# Patient Record
Sex: Female | Born: 1935 | Race: White | Hispanic: No | Smoking: Former smoker
Health system: Southern US, Community
[De-identification: ages and names within clinical notes are randomized; demographics above are authoritative.]

## PROBLEM LIST (undated history)

## (undated) DIAGNOSIS — F419 Anxiety disorder, unspecified: Secondary | ICD-10-CM

## (undated) DIAGNOSIS — N289 Disorder of kidney and ureter, unspecified: Secondary | ICD-10-CM

## (undated) DIAGNOSIS — C50919 Malignant neoplasm of unspecified site of unspecified female breast: Secondary | ICD-10-CM

## (undated) DIAGNOSIS — K589 Irritable bowel syndrome without diarrhea: Secondary | ICD-10-CM

## (undated) DIAGNOSIS — K5790 Diverticulosis of intestine, part unspecified, without perforation or abscess without bleeding: Secondary | ICD-10-CM

## (undated) DIAGNOSIS — I4891 Unspecified atrial fibrillation: Secondary | ICD-10-CM

## (undated) DIAGNOSIS — K4090 Unilateral inguinal hernia, without obstruction or gangrene, not specified as recurrent: Secondary | ICD-10-CM

## (undated) DIAGNOSIS — K219 Gastro-esophageal reflux disease without esophagitis: Secondary | ICD-10-CM

## (undated) DIAGNOSIS — E039 Hypothyroidism, unspecified: Secondary | ICD-10-CM

## (undated) DIAGNOSIS — E875 Hyperkalemia: Secondary | ICD-10-CM

## (undated) DIAGNOSIS — M1990 Unspecified osteoarthritis, unspecified site: Secondary | ICD-10-CM

## (undated) DIAGNOSIS — I10 Essential (primary) hypertension: Secondary | ICD-10-CM

## (undated) DIAGNOSIS — Z8719 Personal history of other diseases of the digestive system: Secondary | ICD-10-CM

## (undated) DIAGNOSIS — D219 Benign neoplasm of connective and other soft tissue, unspecified: Secondary | ICD-10-CM

## (undated) DIAGNOSIS — I341 Nonrheumatic mitral (valve) prolapse: Secondary | ICD-10-CM

## (undated) DIAGNOSIS — K648 Other hemorrhoids: Secondary | ICD-10-CM

## (undated) DIAGNOSIS — Z853 Personal history of malignant neoplasm of breast: Secondary | ICD-10-CM

## (undated) DIAGNOSIS — C73 Malignant neoplasm of thyroid gland: Secondary | ICD-10-CM

## (undated) HISTORY — DX: Unspecified atrial fibrillation: I48.91

## (undated) HISTORY — DX: Nonrheumatic mitral (valve) prolapse: I34.1

## (undated) HISTORY — DX: Hyperkalemia: E87.5

## (undated) HISTORY — DX: Hypothyroidism, unspecified: E03.9

## (undated) HISTORY — DX: Other hemorrhoids: K64.8

## (undated) HISTORY — PX: THYROIDECTOMY: SHX17

## (undated) HISTORY — DX: Personal history of malignant neoplasm of breast: Z85.3

## (undated) HISTORY — DX: Benign neoplasm of connective and other soft tissue, unspecified: D21.9

## (undated) HISTORY — DX: Personal history of other diseases of the digestive system: Z87.19

## (undated) HISTORY — DX: Unspecified osteoarthritis, unspecified site: M19.90

## (undated) HISTORY — PX: PACEMAKER INSERTION: SHX728

## (undated) HISTORY — DX: Unilateral inguinal hernia, without obstruction or gangrene, not specified as recurrent: K40.90

## (undated) HISTORY — DX: Anxiety disorder, unspecified: F41.9

## (undated) HISTORY — DX: Malignant neoplasm of thyroid gland: C73

## (undated) HISTORY — DX: Irritable bowel syndrome, unspecified: K58.9

## (undated) HISTORY — DX: Gastro-esophageal reflux disease without esophagitis: K21.9

## (undated) HISTORY — DX: Disorder of kidney and ureter, unspecified: N28.9

## (undated) HISTORY — DX: Diverticulosis of intestine, part unspecified, without perforation or abscess without bleeding: K57.90

## (undated) HISTORY — PX: TONSILLECTOMY: SUR1361

---

## 1980-02-09 HISTORY — PX: MASTECTOMY: SHX3

## 1997-09-12 ENCOUNTER — Ambulatory Visit (HOSPITAL_COMMUNITY): Admission: RE | Admit: 1997-09-12 | Discharge: 1997-09-12 | Payer: Self-pay | Admitting: Family Medicine

## 1997-09-23 ENCOUNTER — Encounter (HOSPITAL_COMMUNITY): Admission: RE | Admit: 1997-09-23 | Discharge: 1997-12-18 | Payer: Self-pay | Admitting: Family Medicine

## 1997-12-18 ENCOUNTER — Encounter (HOSPITAL_COMMUNITY): Admission: RE | Admit: 1997-12-18 | Discharge: 1998-03-18 | Payer: Self-pay | Admitting: Family Medicine

## 1998-02-11 ENCOUNTER — Ambulatory Visit (HOSPITAL_COMMUNITY): Admission: RE | Admit: 1998-02-11 | Discharge: 1998-02-11 | Payer: Self-pay | Admitting: Family Medicine

## 1998-05-07 ENCOUNTER — Other Ambulatory Visit: Admission: RE | Admit: 1998-05-07 | Discharge: 1998-05-07 | Payer: Self-pay | Admitting: *Deleted

## 1998-10-30 ENCOUNTER — Encounter: Payer: Self-pay | Admitting: Internal Medicine

## 1998-10-30 ENCOUNTER — Ambulatory Visit (HOSPITAL_COMMUNITY): Admission: RE | Admit: 1998-10-30 | Discharge: 1998-10-31 | Payer: Self-pay | Admitting: Internal Medicine

## 1998-10-31 ENCOUNTER — Encounter: Payer: Self-pay | Admitting: Internal Medicine

## 1999-05-21 ENCOUNTER — Emergency Department (HOSPITAL_COMMUNITY): Admission: EM | Admit: 1999-05-21 | Discharge: 1999-05-21 | Payer: Self-pay | Admitting: Emergency Medicine

## 2000-07-06 ENCOUNTER — Encounter: Payer: Self-pay | Admitting: Emergency Medicine

## 2000-07-06 ENCOUNTER — Observation Stay (HOSPITAL_COMMUNITY): Admission: EM | Admit: 2000-07-06 | Discharge: 2000-07-07 | Payer: Self-pay | Admitting: Emergency Medicine

## 2000-07-17 ENCOUNTER — Inpatient Hospital Stay (HOSPITAL_COMMUNITY): Admission: EM | Admit: 2000-07-17 | Discharge: 2000-07-20 | Payer: Self-pay | Admitting: Emergency Medicine

## 2000-09-06 ENCOUNTER — Encounter: Payer: Self-pay | Admitting: Internal Medicine

## 2000-09-06 ENCOUNTER — Encounter (INDEPENDENT_AMBULATORY_CARE_PROVIDER_SITE_OTHER): Payer: Self-pay | Admitting: Specialist

## 2000-09-06 ENCOUNTER — Ambulatory Visit (HOSPITAL_COMMUNITY): Admission: RE | Admit: 2000-09-06 | Discharge: 2000-09-06 | Payer: Self-pay | Admitting: *Deleted

## 2000-09-06 HISTORY — PX: UPPER GASTROINTESTINAL ENDOSCOPY: SHX188

## 2001-05-03 ENCOUNTER — Ambulatory Visit (HOSPITAL_COMMUNITY): Admission: RE | Admit: 2001-05-03 | Discharge: 2001-05-03 | Payer: Self-pay | Admitting: Internal Medicine

## 2001-05-25 ENCOUNTER — Other Ambulatory Visit: Admission: RE | Admit: 2001-05-25 | Discharge: 2001-05-25 | Payer: Self-pay | Admitting: Family Medicine

## 2002-05-29 ENCOUNTER — Other Ambulatory Visit: Admission: RE | Admit: 2002-05-29 | Discharge: 2002-05-29 | Payer: Self-pay | Admitting: Family Medicine

## 2003-05-31 ENCOUNTER — Other Ambulatory Visit: Admission: RE | Admit: 2003-05-31 | Discharge: 2003-05-31 | Payer: Self-pay | Admitting: Family Medicine

## 2004-01-08 ENCOUNTER — Ambulatory Visit: Payer: Self-pay

## 2004-02-04 ENCOUNTER — Ambulatory Visit: Payer: Self-pay

## 2004-02-29 ENCOUNTER — Emergency Department (HOSPITAL_COMMUNITY): Admission: EM | Admit: 2004-02-29 | Discharge: 2004-02-29 | Payer: Self-pay | Admitting: Family Medicine

## 2004-03-07 ENCOUNTER — Ambulatory Visit: Payer: Self-pay | Admitting: Internal Medicine

## 2004-03-18 ENCOUNTER — Ambulatory Visit: Payer: Self-pay | Admitting: Internal Medicine

## 2004-04-15 ENCOUNTER — Ambulatory Visit: Payer: Self-pay | Admitting: Internal Medicine

## 2004-05-14 ENCOUNTER — Ambulatory Visit: Payer: Self-pay | Admitting: Internal Medicine

## 2004-06-16 ENCOUNTER — Other Ambulatory Visit: Admission: RE | Admit: 2004-06-16 | Discharge: 2004-06-16 | Payer: Self-pay | Admitting: Family Medicine

## 2004-06-18 ENCOUNTER — Ambulatory Visit: Payer: Self-pay | Admitting: Internal Medicine

## 2004-06-30 ENCOUNTER — Ambulatory Visit: Payer: Self-pay

## 2004-09-09 ENCOUNTER — Ambulatory Visit: Payer: Self-pay | Admitting: Internal Medicine

## 2004-10-14 ENCOUNTER — Ambulatory Visit: Payer: Self-pay | Admitting: Internal Medicine

## 2004-11-11 ENCOUNTER — Ambulatory Visit: Payer: Self-pay | Admitting: Internal Medicine

## 2004-12-10 ENCOUNTER — Ambulatory Visit: Payer: Self-pay | Admitting: Internal Medicine

## 2005-01-11 ENCOUNTER — Ambulatory Visit: Payer: Self-pay | Admitting: Internal Medicine

## 2005-02-15 ENCOUNTER — Ambulatory Visit: Payer: Self-pay | Admitting: Internal Medicine

## 2005-03-17 ENCOUNTER — Ambulatory Visit: Payer: Self-pay | Admitting: Internal Medicine

## 2005-04-15 ENCOUNTER — Ambulatory Visit: Payer: Self-pay | Admitting: Internal Medicine

## 2005-05-31 ENCOUNTER — Ambulatory Visit: Payer: Self-pay

## 2005-07-14 ENCOUNTER — Ambulatory Visit: Payer: Self-pay | Admitting: Cardiology

## 2005-07-23 ENCOUNTER — Ambulatory Visit: Payer: Self-pay

## 2005-08-04 ENCOUNTER — Ambulatory Visit: Payer: Self-pay | Admitting: Internal Medicine

## 2005-08-04 ENCOUNTER — Ambulatory Visit: Payer: Self-pay

## 2005-08-04 ENCOUNTER — Encounter: Payer: Self-pay | Admitting: Internal Medicine

## 2005-08-09 ENCOUNTER — Ambulatory Visit: Payer: Self-pay | Admitting: Internal Medicine

## 2005-10-28 ENCOUNTER — Encounter: Payer: Self-pay | Admitting: Internal Medicine

## 2005-10-28 ENCOUNTER — Ambulatory Visit (HOSPITAL_COMMUNITY): Admission: RE | Admit: 2005-10-28 | Discharge: 2005-10-28 | Payer: Self-pay | Admitting: *Deleted

## 2005-10-28 HISTORY — PX: COLONOSCOPY: SHX5424

## 2005-11-05 ENCOUNTER — Encounter: Admission: RE | Admit: 2005-11-05 | Discharge: 2005-11-05 | Payer: Self-pay | Admitting: Family Medicine

## 2005-11-25 ENCOUNTER — Ambulatory Visit: Payer: Self-pay | Admitting: Internal Medicine

## 2005-11-30 ENCOUNTER — Ambulatory Visit: Payer: Self-pay | Admitting: Internal Medicine

## 2005-12-10 ENCOUNTER — Ambulatory Visit: Payer: Self-pay | Admitting: Internal Medicine

## 2005-12-10 ENCOUNTER — Ambulatory Visit: Admission: RE | Admit: 2005-12-10 | Discharge: 2005-12-10 | Payer: Self-pay | Admitting: Internal Medicine

## 2005-12-20 ENCOUNTER — Ambulatory Visit: Payer: Self-pay

## 2006-01-11 ENCOUNTER — Ambulatory Visit: Payer: Self-pay | Admitting: Cardiology

## 2006-01-11 ENCOUNTER — Ambulatory Visit (HOSPITAL_COMMUNITY): Admission: AD | Admit: 2006-01-11 | Discharge: 2006-01-11 | Payer: Self-pay | Admitting: Internal Medicine

## 2006-01-14 ENCOUNTER — Ambulatory Visit: Payer: Self-pay | Admitting: Internal Medicine

## 2006-01-27 ENCOUNTER — Ambulatory Visit: Payer: Self-pay | Admitting: Internal Medicine

## 2006-02-11 ENCOUNTER — Ambulatory Visit: Payer: Self-pay | Admitting: Internal Medicine

## 2006-05-25 ENCOUNTER — Ambulatory Visit: Payer: Self-pay | Admitting: Internal Medicine

## 2006-07-26 ENCOUNTER — Other Ambulatory Visit: Admission: RE | Admit: 2006-07-26 | Discharge: 2006-07-26 | Payer: Self-pay | Admitting: Family Medicine

## 2006-12-15 ENCOUNTER — Ambulatory Visit: Payer: Self-pay

## 2007-02-15 ENCOUNTER — Ambulatory Visit: Payer: Self-pay | Admitting: Internal Medicine

## 2007-05-31 ENCOUNTER — Ambulatory Visit: Payer: Self-pay | Admitting: Internal Medicine

## 2007-06-07 ENCOUNTER — Encounter: Payer: Self-pay | Admitting: Internal Medicine

## 2007-06-07 ENCOUNTER — Ambulatory Visit: Payer: Self-pay

## 2007-07-21 ENCOUNTER — Encounter: Payer: Self-pay | Admitting: Internal Medicine

## 2007-11-07 ENCOUNTER — Encounter: Admission: RE | Admit: 2007-11-07 | Discharge: 2007-11-07 | Payer: Self-pay | Admitting: Family Medicine

## 2007-11-14 ENCOUNTER — Ambulatory Visit: Payer: Self-pay | Admitting: Internal Medicine

## 2008-02-26 ENCOUNTER — Ambulatory Visit: Payer: Self-pay | Admitting: Internal Medicine

## 2008-02-26 ENCOUNTER — Encounter: Payer: Self-pay | Admitting: Nurse Practitioner

## 2008-02-26 DIAGNOSIS — Z853 Personal history of malignant neoplasm of breast: Secondary | ICD-10-CM | POA: Insufficient documentation

## 2008-02-26 DIAGNOSIS — E039 Hypothyroidism, unspecified: Secondary | ICD-10-CM | POA: Insufficient documentation

## 2008-02-26 DIAGNOSIS — I4891 Unspecified atrial fibrillation: Secondary | ICD-10-CM | POA: Insufficient documentation

## 2008-02-26 DIAGNOSIS — N259 Disorder resulting from impaired renal tubular function, unspecified: Secondary | ICD-10-CM | POA: Insufficient documentation

## 2008-02-26 DIAGNOSIS — M810 Age-related osteoporosis without current pathological fracture: Secondary | ICD-10-CM | POA: Insufficient documentation

## 2008-02-26 LAB — CONVERTED CEMR LAB
ALT: 21 units/L (ref 0–35)
AST: 22 units/L (ref 0–37)
Albumin: 3.9 g/dL (ref 3.5–5.2)
Alkaline Phosphatase: 41 units/L (ref 39–117)
BUN: 26 mg/dL — ABNORMAL HIGH (ref 6–23)
Bacteria, UA: NEGATIVE
Basophils Absolute: 0 10*3/uL (ref 0.0–0.1)
Basophils Relative: 0.9 % (ref 0.0–3.0)
Bilirubin Urine: NEGATIVE
Bilirubin, Direct: 0.1 mg/dL (ref 0.0–0.3)
CO2: 30 meq/L (ref 19–32)
Calcium: 8.8 mg/dL (ref 8.4–10.5)
Chloride: 105 meq/L (ref 96–112)
Creatinine, Ser: 1.2 mg/dL (ref 0.4–1.2)
Crystals: NEGATIVE
Eosinophils Absolute: 0.4 10*3/uL (ref 0.0–0.7)
Eosinophils Relative: 6.9 % — ABNORMAL HIGH (ref 0.0–5.0)
GFR calc Af Amer: 55 mL/min
GFR calc non Af Amer: 46 mL/min
Glucose, Bld: 87 mg/dL (ref 70–99)
HCT: 38.3 % (ref 36.0–46.0)
Hemoglobin: 13.4 g/dL (ref 12.0–15.0)
Ketones, ur: NEGATIVE mg/dL
Lymphocytes Relative: 24.7 % (ref 12.0–46.0)
MCHC: 35 g/dL (ref 30.0–36.0)
MCV: 91.2 fL (ref 78.0–100.0)
Monocytes Absolute: 0.5 10*3/uL (ref 0.1–1.0)
Monocytes Relative: 9 % (ref 3.0–12.0)
Mucus, UA: NEGATIVE
Neutro Abs: 3 10*3/uL (ref 1.4–7.7)
Neutrophils Relative %: 58.5 % (ref 43.0–77.0)
Nitrite: NEGATIVE
Platelets: 190 10*3/uL (ref 150–400)
Potassium: 5 meq/L (ref 3.5–5.1)
RBC: 4.2 M/uL (ref 3.87–5.11)
RDW: 12.1 % (ref 11.5–14.6)
Sodium: 141 meq/L (ref 135–145)
Specific Gravity, Urine: 1.01 (ref 1.000–1.03)
TSH: 0.97 microintl units/mL (ref 0.35–5.50)
Total Bilirubin: 0.8 mg/dL (ref 0.3–1.2)
Total Protein, Urine: NEGATIVE mg/dL
Total Protein: 6.1 g/dL (ref 6.0–8.3)
Urine Glucose: NEGATIVE mg/dL
Urobilinogen, UA: 0.2 (ref 0.0–1.0)
Vit D, 1,25-Dihydroxy: 50 (ref 30–89)
WBC: 5.2 10*3/uL (ref 4.5–10.5)
pH: 5.5 (ref 5.0–8.0)

## 2008-02-27 ENCOUNTER — Encounter: Payer: Self-pay | Admitting: Internal Medicine

## 2008-02-28 ENCOUNTER — Telehealth: Payer: Self-pay | Admitting: Internal Medicine

## 2008-02-28 LAB — CONVERTED CEMR LAB: Vit D, 1,25-Dihydroxy: 50 (ref 30–89)

## 2008-03-04 ENCOUNTER — Telehealth: Payer: Self-pay | Admitting: Internal Medicine

## 2008-03-13 ENCOUNTER — Telehealth: Payer: Self-pay | Admitting: Internal Medicine

## 2008-03-19 ENCOUNTER — Ambulatory Visit: Payer: Self-pay | Admitting: Cardiology

## 2008-04-02 ENCOUNTER — Ambulatory Visit: Payer: Self-pay | Admitting: Cardiovascular Disease

## 2008-04-16 ENCOUNTER — Telehealth: Payer: Self-pay | Admitting: Internal Medicine

## 2008-05-03 ENCOUNTER — Ambulatory Visit: Payer: Self-pay | Admitting: Cardiology

## 2008-05-23 ENCOUNTER — Encounter: Payer: Self-pay | Admitting: Internal Medicine

## 2008-05-27 ENCOUNTER — Telehealth: Payer: Self-pay | Admitting: Internal Medicine

## 2008-05-28 ENCOUNTER — Ambulatory Visit: Payer: Self-pay | Admitting: Internal Medicine

## 2008-05-28 ENCOUNTER — Ambulatory Visit: Payer: Self-pay | Admitting: Cardiology

## 2008-05-28 ENCOUNTER — Encounter: Payer: Self-pay | Admitting: Internal Medicine

## 2008-05-28 DIAGNOSIS — I442 Atrioventricular block, complete: Secondary | ICD-10-CM | POA: Insufficient documentation

## 2008-06-20 ENCOUNTER — Telehealth: Payer: Self-pay | Admitting: Internal Medicine

## 2008-06-25 ENCOUNTER — Ambulatory Visit: Payer: Self-pay | Admitting: Internal Medicine

## 2008-07-09 ENCOUNTER — Encounter: Payer: Self-pay | Admitting: *Deleted

## 2008-07-23 ENCOUNTER — Ambulatory Visit: Payer: Self-pay | Admitting: Internal Medicine

## 2008-07-23 ENCOUNTER — Encounter (INDEPENDENT_AMBULATORY_CARE_PROVIDER_SITE_OTHER): Payer: Self-pay | Admitting: Cardiology

## 2008-07-23 LAB — CONVERTED CEMR LAB
POC INR: 1.9
Protime: 17

## 2008-08-08 ENCOUNTER — Telehealth: Payer: Self-pay | Admitting: Internal Medicine

## 2008-08-14 ENCOUNTER — Encounter: Payer: Self-pay | Admitting: *Deleted

## 2008-08-20 ENCOUNTER — Ambulatory Visit: Payer: Self-pay | Admitting: Cardiology

## 2008-08-20 ENCOUNTER — Encounter (INDEPENDENT_AMBULATORY_CARE_PROVIDER_SITE_OTHER): Payer: Self-pay | Admitting: Cardiology

## 2008-08-20 LAB — CONVERTED CEMR LAB
POC INR: 2.3
Prothrombin Time: 18.5 s

## 2008-09-17 ENCOUNTER — Ambulatory Visit: Payer: Self-pay | Admitting: Internal Medicine

## 2008-09-17 LAB — CONVERTED CEMR LAB: POC INR: 2.3

## 2008-10-08 ENCOUNTER — Ambulatory Visit: Payer: Self-pay | Admitting: Cardiology

## 2008-10-08 LAB — CONVERTED CEMR LAB: POC INR: 3

## 2008-11-05 ENCOUNTER — Ambulatory Visit: Payer: Self-pay | Admitting: Cardiology

## 2008-11-13 ENCOUNTER — Encounter: Payer: Self-pay | Admitting: Internal Medicine

## 2008-11-13 ENCOUNTER — Ambulatory Visit: Payer: Self-pay

## 2008-11-15 ENCOUNTER — Ambulatory Visit: Payer: Self-pay | Admitting: Internal Medicine

## 2008-12-13 ENCOUNTER — Ambulatory Visit: Payer: Self-pay | Admitting: Cardiovascular Disease

## 2008-12-13 ENCOUNTER — Ambulatory Visit: Payer: Self-pay | Admitting: Internal Medicine

## 2009-01-10 ENCOUNTER — Ambulatory Visit: Payer: Self-pay | Admitting: Cardiovascular Disease

## 2009-02-06 ENCOUNTER — Telehealth: Payer: Self-pay | Admitting: Internal Medicine

## 2009-02-10 ENCOUNTER — Ambulatory Visit: Payer: Self-pay | Admitting: Cardiology

## 2009-02-26 ENCOUNTER — Ambulatory Visit: Payer: Self-pay | Admitting: Internal Medicine

## 2009-02-26 LAB — CONVERTED CEMR LAB
Alkaline Phosphatase: 45 units/L (ref 39–117)
BUN: 25 mg/dL — ABNORMAL HIGH (ref 6–23)
Basophils Absolute: 0 10*3/uL (ref 0.0–0.1)
Basophils Relative: 1 % (ref 0.0–3.0)
Bilirubin, Direct: 0.1 mg/dL (ref 0.0–0.3)
CO2: 28 meq/L (ref 19–32)
Calcium: 8.7 mg/dL (ref 8.4–10.5)
Chloride: 107 meq/L (ref 96–112)
Cholesterol: 156 mg/dL (ref 0–200)
Creatinine, Ser: 1.1 mg/dL (ref 0.4–1.2)
Eosinophils Absolute: 0.2 10*3/uL (ref 0.0–0.7)
HDL: 45.2 mg/dL (ref >39.00)
Hemoglobin, Urine: NEGATIVE
LDL Cholesterol: 99 mg/dL (ref 0–99)
Lymphocytes Relative: 32.2 % (ref 12.0–46.0)
MCHC: 32.3 g/dL (ref 30.0–36.0)
MCV: 94.3 fL (ref 78.0–100.0)
Monocytes Absolute: 0.4 10*3/uL (ref 0.1–1.0)
Neutrophils Relative %: 52.3 % (ref 43.0–77.0)
Nitrite: NEGATIVE
Platelets: 171 10*3/uL (ref 150.0–400.0)
RBC: 4.14 M/uL (ref 3.87–5.11)
Specific Gravity, Urine: 1.01 (ref 1.000–1.030)
Total Bilirubin: 0.8 mg/dL (ref 0.3–1.2)
Total CHOL/HDL Ratio: 3
Total Protein, Urine: NEGATIVE mg/dL
Triglycerides: 60 mg/dL (ref 0.0–149.0)
Urine Glucose: NEGATIVE mg/dL
pH: 5 (ref 5.0–8.0)

## 2009-03-04 ENCOUNTER — Telehealth: Payer: Self-pay | Admitting: Internal Medicine

## 2009-03-05 ENCOUNTER — Encounter: Payer: Self-pay | Admitting: Internal Medicine

## 2009-03-05 ENCOUNTER — Ambulatory Visit: Payer: Self-pay | Admitting: Internal Medicine

## 2009-03-14 ENCOUNTER — Encounter: Payer: Self-pay | Admitting: Internal Medicine

## 2009-03-20 ENCOUNTER — Ambulatory Visit: Payer: Self-pay | Admitting: Cardiology

## 2009-03-20 LAB — CONVERTED CEMR LAB: POC INR: 3

## 2009-03-21 ENCOUNTER — Telehealth: Payer: Self-pay | Admitting: Internal Medicine

## 2009-04-08 ENCOUNTER — Encounter (INDEPENDENT_AMBULATORY_CARE_PROVIDER_SITE_OTHER): Payer: Self-pay | Admitting: *Deleted

## 2009-04-17 ENCOUNTER — Ambulatory Visit: Payer: Self-pay | Admitting: Cardiology

## 2009-04-17 LAB — CONVERTED CEMR LAB: POC INR: 2.7

## 2009-05-08 ENCOUNTER — Telehealth: Payer: Self-pay | Admitting: Internal Medicine

## 2009-05-15 ENCOUNTER — Ambulatory Visit: Payer: Self-pay | Admitting: Cardiovascular Disease

## 2009-05-15 LAB — CONVERTED CEMR LAB: POC INR: 2.4

## 2009-05-21 ENCOUNTER — Telehealth: Payer: Self-pay | Admitting: Internal Medicine

## 2009-06-03 ENCOUNTER — Ambulatory Visit: Payer: Self-pay | Admitting: Internal Medicine

## 2009-06-03 DIAGNOSIS — R002 Palpitations: Secondary | ICD-10-CM | POA: Insufficient documentation

## 2009-06-10 ENCOUNTER — Ambulatory Visit: Payer: Self-pay | Admitting: Internal Medicine

## 2009-06-10 DIAGNOSIS — K219 Gastro-esophageal reflux disease without esophagitis: Secondary | ICD-10-CM | POA: Insufficient documentation

## 2009-06-12 ENCOUNTER — Ambulatory Visit: Payer: Self-pay | Admitting: Cardiology

## 2009-06-12 LAB — CONVERTED CEMR LAB: POC INR: 4.5

## 2009-06-23 ENCOUNTER — Ambulatory Visit: Payer: Self-pay | Admitting: Cardiovascular Disease

## 2009-06-23 LAB — CONVERTED CEMR LAB: POC INR: 3.9

## 2009-07-08 ENCOUNTER — Ambulatory Visit: Payer: Self-pay | Admitting: Internal Medicine

## 2009-08-05 ENCOUNTER — Ambulatory Visit: Payer: Self-pay | Admitting: Internal Medicine

## 2009-09-02 ENCOUNTER — Ambulatory Visit: Payer: Self-pay | Admitting: Cardiology

## 2009-09-02 LAB — CONVERTED CEMR LAB: POC INR: 2.2

## 2009-10-06 ENCOUNTER — Ambulatory Visit: Payer: Self-pay | Admitting: Cardiology

## 2009-10-14 ENCOUNTER — Ambulatory Visit: Payer: Self-pay | Admitting: Internal Medicine

## 2009-10-14 DIAGNOSIS — R1031 Right lower quadrant pain: Secondary | ICD-10-CM | POA: Insufficient documentation

## 2009-10-14 LAB — CONVERTED CEMR LAB
ALT: 18 units/L (ref 0–35)
Amylase: 74 units/L (ref 27–131)
BUN: 24 mg/dL — ABNORMAL HIGH (ref 6–23)
Basophils Relative: 1.1 % (ref 0.0–3.0)
Bilirubin Urine: NEGATIVE
CO2: 30 meq/L (ref 19–32)
Chloride: 101 meq/L (ref 96–112)
Eosinophils Absolute: 0.6 10*3/uL (ref 0.0–0.7)
Eosinophils Relative: 8.3 % — ABNORMAL HIGH (ref 0.0–5.0)
HCT: 39.2 % (ref 36.0–46.0)
Ketones, ur: NEGATIVE mg/dL
Lymphs Abs: 1.3 10*3/uL (ref 0.7–4.0)
MCHC: 34.1 g/dL (ref 30.0–36.0)
MCV: 92.8 fL (ref 78.0–100.0)
Monocytes Absolute: 0.6 10*3/uL (ref 0.1–1.0)
Platelets: 198 10*3/uL (ref 150.0–400.0)
Potassium: 5.7 meq/L — ABNORMAL HIGH (ref 3.5–5.1)
TSH: 1.56 microintl units/mL (ref 0.35–5.50)
Total Bilirubin: 0.7 mg/dL (ref 0.3–1.2)
Total Protein: 6.5 g/dL (ref 6.0–8.3)
Urine Glucose: NEGATIVE mg/dL
Urobilinogen, UA: 0.2 (ref 0.0–1.0)
WBC: 6.7 10*3/uL (ref 4.5–10.5)

## 2009-10-15 ENCOUNTER — Encounter: Payer: Self-pay | Admitting: Internal Medicine

## 2009-10-15 ENCOUNTER — Telehealth: Payer: Self-pay | Admitting: Internal Medicine

## 2009-10-15 ENCOUNTER — Ambulatory Visit: Payer: Self-pay | Admitting: Internal Medicine

## 2009-10-15 ENCOUNTER — Encounter (INDEPENDENT_AMBULATORY_CARE_PROVIDER_SITE_OTHER): Payer: Self-pay | Admitting: *Deleted

## 2009-10-15 DIAGNOSIS — E875 Hyperkalemia: Secondary | ICD-10-CM | POA: Insufficient documentation

## 2009-10-15 LAB — CONVERTED CEMR LAB
CO2: 31 meq/L (ref 19–32)
Calcium: 8.6 mg/dL (ref 8.4–10.5)
Chloride: 100 meq/L (ref 96–112)
Potassium: 4.9 meq/L (ref 3.5–5.1)
Sodium: 139 meq/L (ref 135–145)

## 2009-11-03 ENCOUNTER — Ambulatory Visit: Payer: Self-pay | Admitting: Cardiovascular Disease

## 2009-11-03 LAB — CONVERTED CEMR LAB: POC INR: 2.8

## 2009-12-01 ENCOUNTER — Ambulatory Visit: Payer: Self-pay | Admitting: Internal Medicine

## 2009-12-01 DIAGNOSIS — K589 Irritable bowel syndrome without diarrhea: Secondary | ICD-10-CM | POA: Insufficient documentation

## 2009-12-01 LAB — CONVERTED CEMR LAB: POC INR: 2.6

## 2009-12-29 ENCOUNTER — Ambulatory Visit: Payer: Self-pay | Admitting: Cardiovascular Disease

## 2010-01-26 ENCOUNTER — Ambulatory Visit: Payer: Self-pay | Admitting: Internal Medicine

## 2010-01-26 LAB — CONVERTED CEMR LAB: POC INR: 2.6

## 2010-02-23 ENCOUNTER — Ambulatory Visit: Admission: RE | Admit: 2010-02-23 | Discharge: 2010-02-23 | Payer: Self-pay | Source: Home / Self Care

## 2010-03-10 NOTE — Assessment & Plan Note (Signed)
Summary: pacer check/medtronic      Allergies Added: NKDA  History of Present Illness: Alyssa [name] is seen in followup for complete heart block. She is status post pacemaker implantation. She has atrial fibrillation which is permanent. She is taking and tolerating her Coumadin. There have been no intercurrent problems with exercise intolerance shortness of breath dyspnea or chest pain.  she has recently noticed however palpitations that occurred night watches what in the bathroom otherwise at times of modest exertion. She saw her primary care physician a couple months ago at which time blood were drawn;  in January her TSH and hemoglobin were normal.  Current Medications (verified): 1)  Levoxyl 88 Mcg Tabs (Levothyroxine Sodium) .... Take 1 Tablet By Mouth Once A Day 2)  Metoprolol Tartrate 50 Mg Tabs (Metoprolol Tartrate) .... 1/2am- 1/2 Pm 3)  Coumadin 3 Mg Tabs (Warfarin Sodium) .... Use As Directed By Anticoagulation Clinic. 4)  Tylenol Arthritis Pain 650 Mg Cr-Tabs (Acetaminophen) .... Take 1 Tablet By Mouth Two Times A Day 5)  Centrium Silver .... Take 1 Tablet By Mouth Once A Day 6)  Calcium + Vitamin D .... Take 1 Tablet By Mouth Once A Day 7)  Fiber 500mg  .... Qdtab 8)  Zantac 150 Mg Tabs (Ranitidine Hcl) .... Take 1 Tablet By Mouth Once A Day 9)  Loratadine 10 Mg Tabs (Loratadine) .... Take 1 Tablet By Mouth Once A Day 10)  Coumadin 4 Mg Tabs (Warfarin Sodium) .... Use As Directed By Anticoagulation Clinic.  Allergies (verified): No Known Drug Allergies  Past History:  Past Medical History: Last updated: 02/26/2008 Hypothyroidism Osteoporosis Renal insufficiency (BUN=27 and Cr.=1.28 on 07/21/07) Thyroid Cancer Breast cancer, hx of A.fib, pacemaker MVP Left direct inguinal hernia Atrial fibrillation  Past Surgical History: Last updated: 02/26/2008 Thyroidectomy Mastectomy Bilat. in 1982 Tonsillectomy  Family History: Last updated: 02/26/2009 Family History of  Arthritis Family History Hypertension Family History Lung cancer Family History of Stroke F 1st degree relative <60 Family History of Colon CA 1st degree relative <60  Social History: Last updated: 02/26/2008 Retired Widow/Widower Never Smoked Alcohol use-no Drug use-no Regular exercise-yes  Risk Factors: Alcohol Use: 0 (02/26/2009) Exercise: yes (02/26/2008)  Risk Factors: Smoking Status: never (02/26/2009) Passive Smoke Exposure: no (02/26/2009)  Vital Signs:  Patient profile:   77 year old female Height:      66 inches Weight:      146 pounds BMI:     23.65 Pulse rate:   79 / minute BP sitting:   130 / 76  (left arm) Cuff size:   regular  Vitals Entered By: Judithe Modest CMA (June 03, 2009 12:29 PM)  Physical Exam  General:  The patient was alert and oriented in no acute distress. HEENT Normal.  Neck veins were flat, carotids were brisk.  Lungs were clear.  Heart sounds were regular without murmurs or gallops.  Abdomen was soft with active bowel sounds. There is no clubbing cyanosis or edema. Skin Warm and dry    PPM Specifications Following MD:  Sherryl Manges, MD     PPM Vendor:  Medtronic     PPM Model Number:  [id number]     PPM Serial Number:  [id number] [facility] PPM DOI:  12/10/2005     PPM Implanting MD:  Sherryl Manges, MD  Lead 1    Location: RA     DOI: 10/30/1998     Model #: [id number]     Serial #: [id number]     Status: active Lead 2  Location: RV     DOI: 10/30/1998     Model #: [id number]     Serial #: [id number]     Status: active  Magnet Response Rate:  BOL 85 ERI  65  Indications:  Pacemaker dependent   PPM Follow Up Remote Check?  No Battery Voltage:  2.77 V     Battery Est. Longevity:  20yrs     Pacer Dependent:  Yes     Right Ventricle  Amplitude: PACED AT 30 mV, Impedance: 543 ohms, Threshold: 0.750 V at 0.40 msec  Episodes MS Episodes:  0     Coumadin:  Yes Ventricular High Rate:  0     Ventricular Pacing:  99.8%  Parameters Mode:  VVIR     Lower Rate  Limit:  60     Upper Rate Limit:  130 Next Cardiology Appt Due:  12/05/2009 Tech Comments:  ROV 6 MTHS IN CLINIC.  NORMAL DEVICE FUNCTION.  DR Graciela Husbands TO EVALUATE HISTOGRAMS TO DETERMINE PROGRAMMING CHANGES. Gypsy Balsam RN BSN  June 03, 2009 12:40 PM  Impression & Recommendations:  Problem # 1:  PALPITATIONS (ICD-785.1) the patient had palpitations with modest exertion. She has the bifid hump of excessive ADL rate programming with a Medtronic device. This was reprogrammed. She is to let us know how it is that she feels. Her updated medication list for this problem includes:    Metoprolol Tartrate 50 Mg Tabs (Metoprolol tartrate) .Marland Kitchen... 1/2am- 1/2 pm    Coumadin 3 Mg Tabs (Warfarin sodium) ..... Use as directed by anticoagulation clinic.    Coumadin 4 Mg Tabs (Warfarin sodium) ..... Use as directed by anticoagulation clinic.  Problem # 2:  PACEMAKER, PERMANENT: MEDTRONIC VERSA (ICD-V45.01) Device parameters and data were reviewed and the device was reprogrammed as above with her ADL rate going from 3-2  Problem # 3:  ATRIAL FIBRILLATION (ICD-427.31) permanent on Coumadin Her updated medication list for this problem includes:    Metoprolol Tartrate 50 Mg Tabs (Metoprolol tartrate) .Marland Kitchen... 1/2am- 1/2 pm    Coumadin 3 Mg Tabs (Warfarin sodium) ..... Use as directed by anticoagulation clinic.    Coumadin 4 Mg Tabs (Warfarin sodium) ..... Use as directed by anticoagulation clinic.  Problem # 4:  AV BLOCK, COMPLETE (ICD-426.0) stable post pacemaker implant he Her updated medication list for this problem includes:    Metoprolol Tartrate 50 Mg Tabs (Metoprolol tartrate) .Marland Kitchen... 1/2am- 1/2 pm    Coumadin 3 Mg Tabs (Warfarin sodium) ..... Use as directed by anticoagulation clinic.    Coumadin 4 Mg Tabs (Warfarin sodium) ..... Use as directed by anticoagulation clinic.

## 2010-03-10 NOTE — Op Note (Signed)
Summary: Colonoscopy: Dr. Beatrix Fetters H. [facility]  Patient:    Alyssa [name], Alyssa [name]                         MRN: [id number] Proc. Date: 09/06/00 Adm. Date:  [id number] Attending:  Sabino Gasser CC:         Quita Skye. Artis Flock, M.D.   Procedure Report  PROCEDURE:  Colonoscopy.  GASTROENTEROLOGIST:  Sabino Gasser, M.D.  INDICATIONS:  Diarrhea which apparently now has subsided and colon cancer screening.  ANESTHESIA:  Demerol 10 mg.  PROCEDURE IN DETAIL:  With the patient mildly sedated and in the left lateral decubitus position, the Olympus video pediatric colonoscope, [id number],  was inserted in the rectum after rectal exam was performed, and then passed under direct vision to the cecum identified by ileocecal valve and appendiceal orifice, both of which were photographed.  From this point, the colonoscope was slowly withdrawn, taking circumferential views of the entire colonic mucosa, stopping only then to photograph diverticulosis moderately severe seen in the sigmoid colon, until we reached the rectum which appeared normal on direct view and showed internal hemorrhoids on retroflexed view.  The endoscope was straightened and withdrawn.  The patients vital signs and pulse oximetry remained stable.  The patient tolerated the procedure well with no apparent complications.  FINDINGS: 1. Essentially normal-appearing mucosa.  Random biopsy taken because    of the history of diarrhea, although it has now subsided. 2. Diverticulosis of the sigmoid colon. 3. Internal hemorrhoids. 4. Otherwise unremarkable examination.  PLAN:  Will have patient follow up on results of biopsy and follow up with me as needed. DD:  09/06/00 TD:  09/06/00 Job: [id number]

## 2010-03-10 NOTE — Letter (Signed)
Summary: [facility] Referral (unable to schedule) Notification  [facility]  [address] [place], [place] [address]   Phone: [phone]  Fax: [phone]      April 08, 2009 Alyssa [name] Jun 28, 1924 MRN: [id number]   Alyssa [name] [address] [place], [place]  [address]   Dear Dr. Yetta Barre:   Thank you for your kind referral of the above patient. We have attempted to schedule the recommended COLONOSCOPY but have been unable to schedule because:  __ The patient was not available by phone and/or has not returned our calls.  _X_ The patient declined to schedule the procedure at this time.  We appreciate the referral and hope that we will have the opportunity to treat this patient in the future.    Sincerely,   [facility]  Vania Rea. Jarold Motto M.D. Hedwig Morton. Juanda Chance M.D. Venita Lick. Russella Dar M.D. Wilhemina Bonito. Marina Goodell M.D. Barbette Hair. Arlyce Dice M.D. Iva Boop M.D. Cheron Every.D.

## 2010-03-10 NOTE — Progress Notes (Signed)
Summary: referral  Phone Note Call from Patient Call back at Home Phone [phone]   Summary of Call: Patient called this AM stating that she will be seeing Dr. Joseph Art (dermatology) at 10 this AM. She needs referral sent to them due to insurance. Their ph # is [phone]. Initial call taken by: Lucious Groves,  May 08, 2009 9:05 AM  Follow-up for Phone Call        what is the diagnosis for the referral? Follow-up by: [name],  May 08, 2009 9:08 AM  Additional Follow-up for Phone Call Additional follow up Details #1::        left message on machine to call back to office. Additional Follow-up by: Lucious Groves,  May 08, 2009 3:36 PM    Additional Follow-up for Phone Call Additional follow up Details #2::    spoke with pt and she has been a long time pt with Dr Joseph Art she does not need a ref. Follow-up by: Ami Bullins CMA,  May 09, 2009 8:48 AM

## 2010-03-10 NOTE — Medication Information (Signed)
Summary: rov/mb  Anticoagulant Therapy  Managed by: Bethena Midget, RN, BSN PCP: [name] Supervising MD: Ladona Ridgel MD, Sharlot Gowda Indication 1: Atrial Fibrillation (ICD-427.31) Lab Used: [facility] Site: [facility] INR POC 2.5 INR RANGE 2 - 3  Dietary changes: no    Health status changes: no    Bleeding/hemorrhagic complications: no    Recent/future hospitalizations: no    Any changes in medication regimen? no    Recent/future dental: no  Any missed doses?: no       Is patient compliant with meds? yes       Allergies: No Known Drug Allergies  Anticoagulation Management History:      The patient is taking warfarin and comes in today for a routine follow up visit.  Positive risk factors for bleeding include an age of 75 years or older.  The bleeding index is 'intermediate risk'.  Positive CHADS2 values include Age > 75 years old.  The start date was 03/12/1999.  Her last INR was 3.1.  Anticoagulation responsible provider: Ladona Ridgel MD, Sharlot Gowda.  INR POC: 2.5.  Cuvette Lot#: [id number].  Exp: 09/2010.    Anticoagulation Management Assessment/Plan:      The patient's current anticoagulation dose is Coumadin 3 mg tabs: Use as directed by anticoagulation clinic., Coumadin 4 mg tabs: use as directed by anticoagulation clinic..  The target INR is 2 - 3.  The next INR is due 08/05/2009.  Anticoagulation instructions were given to patient.  Results were reviewed/authorized by Bethena Midget, RN, BSN.  She was notified by Bethena Midget, RN, BSN.         Prior Anticoagulation Instructions: INR = 3.9  Hold Monday's dose, take 1/2 of a 4 mg tablet on tuesday.  Then begin normal schedule: 3 mg talbet SMWF, take 4 mg tablet on TTS  Current Anticoagulation Instructions: INR 2.5 Continue 3mg s everyday except 4mg s on Tuesdays, Thursdays and Saturdays. Recheck in 4 weeks.

## 2010-03-10 NOTE — Medication Information (Signed)
Summary: rov/ej      Allergies Added: NKDA Anticoagulant Therapy  Managed by: Earvin Hansen, Pharm D PCP: [name] Supervising MD: Excell Seltzer MD, Casimiro Needle Indication 1: Atrial Fibrillation (ICD-427.31) Lab Used: [facility] Site: [facility] INR POC 2.8 INR RANGE 2 - 3  Dietary changes: no            Current Medications (verified): 1)  Levoxyl 88 Mcg Tabs (Levothyroxine Sodium) .... Take 1 Tablet By Mouth Once A Day 2)  Metoprolol Tartrate 50 Mg Tabs (Metoprolol Tartrate) .... 1/2am- 1/2 Pm 3)  Coumadin 3 Mg Tabs (Warfarin Sodium) .... Use As Directed By Anticoagulation Clinic. 4)  Tylenol Arthritis Pain 650 Mg Cr-Tabs (Acetaminophen) .... Take 2 Tablet By Mouth 5)  Centrium Silver .... Take 1 Tablet By Mouth Once A Day 6)  Calcium + Vitamin D .... Take 1 Tablet By Mouth Once A Day 7)  Fiber 500mg  .... 2 Tabs Daily 8)  Loratadine 10 Mg Tabs (Loratadine) .... Take 1 Tablet By Mouth Once A Day 9)  Coumadin 4 Mg Tabs (Warfarin Sodium) .... Use As Directed By Anticoagulation Clinic. 10)  Ranitidine Hcl 300 Mg Caps (Ranitidine Hcl) .... One By Mouth At Bedtime For Heartburn 11)  Kionex 15 Gm/33ml Susp (Sodium Polystyrene Sulfonate) .... 60 Ml With Sorbitol By Mouth Once  Allergies (verified): No Known Drug Allergies  Anticoagulation Management History:      The patient is taking warfarin and comes in today for a routine follow up visit.  Positive risk factors for bleeding include an age of 64 years or older.  The bleeding index is 'intermediate risk'.  Positive CHADS2 values include Age > 68 years old.  The start date was 03/12/1999.  Her last INR was 3.1.  Anticoagulation responsible provider: Excell Seltzer MD, Casimiro Needle.  INR POC: 2.8.  Exp: 11/2010.    Anticoagulation Management Assessment/Plan:      The patient's current anticoagulation dose is Coumadin 3 mg tabs: Use as directed by anticoagulation clinic., Coumadin 4 mg tabs: use as directed by anticoagulation clinic..   The target INR is 2 - 3.  The next INR is due 12/01/2009.  Anticoagulation instructions were given to patient.  Results were reviewed/authorized by Earvin Hansen, Pharm D.  She was notified by Earvin Hansen PharmD.         Prior Anticoagulation Instructions: INR 3.1 Today take 1/2 tablet of 3mg s then resume 3mg s daily except 4mg s on Tuesdays, Thursdays and Saturdays. Recheck in 4 weeks.  Current Anticoagulation Instructions: Continue taking 3 mg on Sunday, Monday, Wednesday, & Friday; then take 4 mg on Tuesday, Thursday, and Saturdays.

## 2010-03-10 NOTE — Letter (Signed)
Summary: Lipid Letter  [facility]  [address] [place], [place] [address]   Phone: [phone]  Fax: [phone]    02/26/2009  Alyssa [name] [address] [name], [place]  [address]  Dear Ms. Grigg:  We have carefully reviewed your last lipid profile from 02/26/2009 and the results are noted below with a summary of recommendations for lipid management.    Cholesterol:       156     Goal: <   HDL "good" Cholesterol:   57.84     Goal: >   LDL "bad" Cholesterol:   99     Goal: <   Triglycerides:       60.0     Goal: <    great results!    TLC Diet (Therapeutic Lifestyle Change): Saturated Fats & Transfatty acids should be kept < 7% of total calories ***Reduce Saturated Fats Polyunstaurated Fat can be up to 10% of total calories Monounsaturated Fat Fat can be up to 20% of total calories Total Fat should be no greater than 25-35% of total calories Carbohydrates should be 50-60% of total calories Protein should be approximately 15% of total calories Fiber should be at least 20-30 grams a day ***Increased fiber may help lower LDL Total Cholesterol should be < 200mg /day Consider adding plant stanol/sterols to diet (example: Benacol spread) ***A higher intake of unsaturated fat may reduce Triglycerides and Increase HDL    Adjunctive Measures (may lower LIPIDS and reduce risk of Heart Attack) include: Aerobic Exercise (20-30 minutes 3-4 times a week) Limit Alcohol Consumption Weight Reduction Aspirin 75-81 mg a day by mouth (if not allergic or contraindicated) Dietary Fiber 20-30 grams a day by mouth     Current Medications: 1)    Levoxyl 88 Mcg Tabs (Levothyroxine sodium) .... Take 1 tablet by mouth once a day 2)    Metoprolol Tartrate 50 Mg Tabs (Metoprolol tartrate) .... 1/2am- 1/2 pm 3)    Coumadin 3 Mg Tabs (Warfarin sodium) .... Use as directed by anticoagulation clinic. 4)    Tylenol Arthritis Pain 650 Mg Cr-tabs (Acetaminophen) .... Take 1 tablet  by mouth two times a day 5)    Centrium Silver  .... Take 1 tablet by mouth once a day 6)    Calcium + Vitamin D  .... Take 1 tablet by mouth once a day 7)    Fiber 500mg   .... Qdtab 8)    Zantac 150 Mg Tabs (Ranitidine hcl) .... Take 1 tablet by mouth once a day 9)    Loratadine 10 Mg Tabs (Loratadine) .... Take 1 tablet by mouth once a day 10)    Coumadin 4 Mg Tabs (Warfarin sodium) .... Use as directed by anticoagulation clinic.  If you have any questions, please call. We appreciate being able to work with you.   Sincerely,    [facility] [name]

## 2010-03-10 NOTE — Medication Information (Signed)
Summary: rov/ewj  Anticoagulant Therapy  Managed by: Bethena Midget, RN, BSN PCP: [name] Supervising MD: Riley Kill MD, Maisie Fus Indication 1: Atrial Fibrillation (ICD-427.31) Lab Used: [facility] Site: [facility] INR POC 3.1 INR RANGE 2 - 3  Dietary changes: yes       Details: Eating alot of leafy veggies  Health status changes: no    Bleeding/hemorrhagic complications: no    Recent/future hospitalizations: no    Any changes in medication regimen? no    Recent/future dental: no  Any missed doses?: no       Is patient compliant with meds? yes       Allergies: No Known Drug Allergies  Anticoagulation Management History:      The patient is taking warfarin and comes in today for a routine follow up visit.  Positive risk factors for bleeding include an age of 75 years or older.  The bleeding index is 'intermediate risk'.  Positive CHADS2 values include Age > 75 years old.  The start date was 03/12/1999.  Her last INR was 3.1.  Anticoagulation responsible provider: Riley Kill MD, Maisie Fus.  INR POC: 3.1.  Cuvette Lot#: [id number].  Exp: 11/2010.    Anticoagulation Management Assessment/Plan:      The patient's current anticoagulation dose is Coumadin 3 mg tabs: Use as directed by anticoagulation clinic., Coumadin 4 mg tabs: use as directed by anticoagulation clinic..  The target INR is 2 - 3.  The next INR is due 11/03/2009.  Anticoagulation instructions were given to patient.  Results were reviewed/authorized by Bethena Midget, RN, BSN.  She was notified by Bethena Midget, RN, BSN.         Prior Anticoagulation Instructions: INR 2.2  Continue on same dosage 3mg  daily except 4mg  on Tuesdays, Thursdays, and Saturdays.  Recheck in 4 weeks.    Current Anticoagulation Instructions: INR 3.1 Today take 1/2 tablet of 3mg s then resume 3mg s daily except 4mg s on Tuesdays, Thursdays and Saturdays. Recheck in 4 weeks.

## 2010-03-10 NOTE — Op Note (Signed)
Summary: EGD: Dr. Beatrix Fetters H. [facility]  Patient:    Alyssa [name], Alyssa [name]                         MRN: [id number] Proc. Date: 09/06/00 Adm. Date:  [id number] Attending:  Sabino Gasser CC:         Quita Skye. Artis Flock, M.D.   Procedure Report  PROCEDURE PERFORMED:  Upper endoscopy.  ENDOSCOPIST:  Sabino Gasser, M.D.  INDICATIONS FOR PROCEDURE:  Gastroesophageal reflux disease symptomatology.  ANESTHESIA:  Demerol 50 mg, Versed 5 mg.  DESCRIPTION OF PROCEDURE:  With the patient mildly sedated in the left lateral decubitus position, the Olympus video endoscope was inserted in the mouth and passed under direct vision through the esophagus, which appeared normal. Photograph taken.  We entered into the stomach.  The fundus, body, antrum, duodenal bulb and second portion of the duodenum were all well-viewed and appeared normal.  Again photographs were taken.  From this point, the endoscope was slowly withdrawn taking circumferential views of the entire duodenal mucosa until the endoscope had been pulled back into the stomach and placed on retroflexion to view the stomach from below and this, too appeared normal.  The endoscope was then straightened and withdrawn taking circumferential views of the entire gastric and subsequently esophageal mucosa, which otherwise appeared normal.  Patients vital signs and pulse oximeter remained stable.  The patient tolerated the procedure well without apparent complications.  FINDINGS:  Essentially normal upper endoscopic examination.  PLAN:  Will have patient follow up with me as an outpatient. Continue present therapy.  Proceed to colonoscopy. DD:  09/06/00 TD:  09/06/00 Job: [id number]

## 2010-03-10 NOTE — Assessment & Plan Note (Signed)
Summary: hernia/IBS flare / SD   Vital Signs:  Patient profile:   77 year old female Height:      66 inches Weight:      143 pounds BMI:     23.16 O2 Sat:      95 % on Room air Temp:     97.6 degrees F oral Pulse rate:   67 / minute Pulse rhythm:   regular Resp:     16 per minute BP sitting:   108 / 62  (left arm) Cuff size:   large  Vitals Entered By: Rock Nephew CMA (Jun 10, 2009 8:18 AM)  O2 Flow:  Room air  Primary Care Provider:  [name]  CC:  Diarrhea.  History of Present Illness:  Diarrhea      This is an 77 year old woman who presents with Diarrhea.  The symptoms began 5 days ago.  The severity is described as mild.  The patient reports 3 stools or less per day, watery/unformed stools, malodorous stools, gassiness, and abrupt onset of symptoms, but denies voluminous stools, blood in stool, mucus in stool, greasy stools, fecal urgency, fecal soiling, alternating diarrhea/constipation, nocturnal diarrhea, fasting diarrhea, bloating, and gradual onset of symptoms.  The symptoms are worse with specific foods.  The symptoms are better with fasting.  Patient's risk factors for diarrhea include recent antibiotic use.  Patient has a  history of irritable bowel syndrome.  The patient denies fever, abdominal pain, abdominal cramps, nausea, vomiting, lightheadedness, increased thirst, weight loss, joint pains, mouth ulcers, and eye redness.    Dyspepsia History:      She has no alarm features of dyspepsia including no history of melena, hematochezia, dysphagia, persistent vomiting, or involuntary weight loss > 5%.  There is a prior history of GERD.  The patient does not have a prior history of documented ulcer disease.  The dominant symptom is heartburn or acid reflux.  An H-2 blocker medication is currently being taken.  She notes that the symptoms have improved with the H-2 blocker therapy.  Symptoms have persisted after 4 weeks of H-2 blocker treatment.  No previous upper  endoscopy has been done.     Preventive Screening-Counseling & Management  Alcohol-Tobacco     Alcohol drinks/day: 0     Smoking Status: never     Passive Smoke Exposure: no  Hep-HIV-STD-Contraception     Hepatitis Risk: no risk noted     HIV Risk: no      Sexual History:  currently monogamous.        Drug Use:  no.        Blood Transfusions:  no.    Allergies: No Known Drug Allergies  Past History:  Past Medical History: Reviewed history from 02/26/2008 and no changes required. Hypothyroidism Osteoporosis Renal insufficiency (BUN=27 and Cr.=1.28 on 07/21/07) Thyroid Cancer Breast cancer, hx of A.fib, pacemaker MVP Left direct inguinal hernia Atrial fibrillation  Past Surgical History: Reviewed history from 02/26/2008 and no changes required. Thyroidectomy Mastectomy Bilat. in 1982 Tonsillectomy  Family History: Reviewed history from 02/26/2009 and no changes required. Family History of Arthritis Family History Hypertension Family History Lung cancer Family History of Stroke F 1st degree relative <60 Family History of Colon CA 1st degree relative <60  Social History: Reviewed history from 02/26/2008 and no changes required. Retired Conservation officer, [employer] Never Smoked Alcohol use-no Drug use-no Regular exercise-yes Hepatitis Risk:  no risk noted Sexual History:  currently monogamous Blood Transfusions:  no  Review  of Systems       The patient complains of severe indigestion/heartburn.  The patient denies anorexia, fever, weight loss, weight gain, chest pain, syncope, prolonged cough, abdominal pain, melena, hematochezia, hematuria, suspicious skin lesions, enlarged lymph nodes, and angioedema.   Endo:  Denies cold intolerance, excessive hunger, excessive thirst, excessive urination, heat intolerance, polyuria, and weight change.  Physical Exam  General:  The patient was alert and oriented in no acute distress.Neck veins were flat, carotids were brisk. Lungs  were clear. Heart sounds were regular without murmurs or gallops. Abdomen was soft with active bowel sounds. There is no clubbing cyanosis or edema. there is thrush that There is some swelling on the medial aspect of Alyssa [name] right lower leg where she banged it against a flat of flowers. Eyes:  pink moist mm., no icterus, Mouth:  Oral mucosa and oropharynx without lesions or exudates.  Teeth in good repair. Neck:  supple, full ROM, no masses, no thyromegaly, no JVD, normal carotid upstroke, no carotid bruits, no cervical lymphadenopathy, and no neck tenderness.   Lungs:  normal respiratory effort, no intercostal retractions, no accessory muscle use, normal breath sounds, no dullness, no fremitus, no crackles, and no wheezes.   Heart:  normal rate, regular rhythm, no murmur, no gallop, no rub, and no JVD.   Abdomen:  soft, non-tender, no distention, no masses, no guarding, no rigidity, no rebound tenderness, no abdominal hernia, no inguinal hernia, no hepatomegaly, no splenomegaly, and bowel sounds hyperactive.   Msk:  No deformity or scoliosis noted of thoracic or lumbar spine.   Pulses:  R and L carotid,radial,femoral,dorsalis pedis and posterior tibial pulses are full and equal bilaterally Extremities:  No clubbing, cyanosis, edema, or deformity noted with normal full range of motion of all joints.   Neurologic:  No cranial nerve deficits noted. Station and gait are normal. Plantar reflexes are down-going bilaterally. DTRs are symmetrical throughout. Sensory, motor and coordinative functions appear intact. Skin:  Intact without suspicious lesions or rashes Cervical Nodes:  no anterior cervical adenopathy and no posterior cervical adenopathy.   Axillary Nodes:  no R axillary adenopathy and no L axillary adenopathy.   Psych:  Cognition and judgment appear intact. Alert and cooperative with normal attention span and concentration. No apparent delusions, illusions, hallucinations   Impression &  Recommendations:  Problem # 1:  GERD (ICD-530.81) Assessment New  The following medications were removed from the medication list:    Zantac 150 Mg Tabs (Ranitidine hcl) .Marland Kitchen... Take 1 tablet by mouth once a day Alyssa [name] updated medication list for this problem includes:    Ranitidine Hcl 300 Mg Caps (Ranitidine hcl) ..... One by mouth at bedtime for heartburn  Problem # 2:  DIARRHEA OF PRESUMED INFECTIOUS ORIGIN (ICD-009.3) Assessment: New  empiric treatment with flagyl in light of recent antibiotics for dental work and concern for C. Diff. infection. Orders: T-Culture, C-Diff Toxin A/B ([id number]) T-Stool for O&P ([id number]) Prescription Created Electronically [phone])  Discussed symptom control and diet. Call if worsening of symptoms or signs of dehydration.   Problem # 3:  HYPOTHYROIDISM (ICD-244.9) Assessment: Unchanged  Alyssa [name] updated medication list for this problem includes:    Levoxyl 88 Mcg Tabs (Levothyroxine sodium) .Marland Kitchen... Take 1 tablet by mouth once a day  Labs Reviewed: TSH: 1.12 (02/26/2009)    Chol: 156 (02/26/2009)   HDL: 45.20 (02/26/2009)   LDL: 99 (02/26/2009)   TG: 60.0 (02/26/2009)  Complete Medication List: 1)  Levoxyl 88 Mcg Tabs (Levothyroxine sodium) .... Take  1 tablet by mouth once a day 2)  Metoprolol Tartrate 50 Mg Tabs (Metoprolol tartrate) .... 1/2am- 1/2 pm 3)  Coumadin 3 Mg Tabs (Warfarin sodium) .... Use as directed by anticoagulation clinic. 4)  Tylenol Arthritis Pain 650 Mg Cr-tabs (Acetaminophen) .... Take 1 tablet by mouth two times a day 5)  Centrium Silver  .... Take 1 tablet by mouth once a day 6)  Calcium + Vitamin D  .... Take 1 tablet by mouth once a day 7)  Fiber 500mg   .... Qdtab 8)  Loratadine 10 Mg Tabs (Loratadine) .... Take 1 tablet by mouth once a day 9)  Coumadin 4 Mg Tabs (Warfarin sodium) .... Use as directed by anticoagulation clinic. 10)  Metronidazole 250 Mg Tabs (Metronidazole) .... One by mouth three times a day for 7  days 11)  Ranitidine Hcl 300 Mg Caps (Ranitidine hcl) .... One by mouth at bedtime for heartburn  Patient Instructions: 1)  Please schedule a follow-up appointment in 1 month. 2)  Avoid foods high in acid (tomatoes, citrus juices, spicy foods). Avoid eating within two hours of lying down or before exercising. Do not over eat; try smaller more frequent meals. Elevate head of bed twelve inches when sleeping. 3)  Take your antibiotic as prescribed until ALL of it is gone, but stop if you develop a rash or swelling and contact our office as soon as possible. 4)  teh main problem with gastroenteritis is dehydration. Drink plenty of fluids and take solids as you feel better. If you are unable to keep anything down and/or you show signs of dehydration(dry/cracked lips, lack of tears, not urinating, very sleepy), call our office. Prescriptions: RANITIDINE HCL 300 MG CAPS (RANITIDINE HCL) One by mouth at bedtime for heartburn  #30 x 11   Entered and Authorized by:   [name]   Signed by:   [name] on 06/10/2009   Method used:   Electronically to        [facility]  [phone]* (retail)       [address]       [place], [place]  [address]       Ph: [phone] or [phone]       Fax: [phone]   RxID:   [id number] METRONIDAZOLE 250 MG TABS (METRONIDAZOLE) One by mouth three times a day for 7 days  #21 x 1   Entered and Authorized by:   [name]   Signed by:   [name] on 06/10/2009   Method used:   Electronically to        [facility]  [phone]* (retail)       [address]       [place], [place]  [address]       Ph: [phone] or [phone]       Fax: [phone]   RxID:   [id number]

## 2010-03-10 NOTE — Medication Information (Signed)
Summary: rov/kb      Allergies Added: NKDA Anticoagulant Therapy  Managed by: Loma Newton, PharmD PCP: [name] Supervising MD: Clifton James MD, Cristal Deer Indication 1: Atrial Fibrillation (ICD-427.31) Lab Used: [facility] Site: [facility] INR POC 3.9 INR RANGE 2 - 3  Dietary changes: yes       Details: eating not as often  Health status changes: yes       Details: Had the stomach flu within the last 2 weeks  Bleeding/hemorrhagic complications: no    Recent/future hospitalizations: no    Any changes in medication regimen? no    Recent/future dental: no  Any missed doses?: no       Is patient compliant with meds? yes       Current Medications (verified): 1)  Levoxyl 88 Mcg Tabs (Levothyroxine Sodium) .... Take 1 Tablet By Mouth Once A Day 2)  Metoprolol Tartrate 50 Mg Tabs (Metoprolol Tartrate) .... 1/2am- 1/2 Pm 3)  Coumadin 3 Mg Tabs (Warfarin Sodium) .... Use As Directed By Anticoagulation Clinic. 4)  Tylenol Arthritis Pain 650 Mg Cr-Tabs (Acetaminophen) .... Take 1 Tablet By Mouth Two Times A Day 5)  Centrium Silver .... Take 1 Tablet By Mouth Once A Day 6)  Calcium + Vitamin D .... Take 1 Tablet By Mouth Once A Day 7)  Fiber 500mg  .... Qdtab 8)  Loratadine 10 Mg Tabs (Loratadine) .... Take 1 Tablet By Mouth Once A Day 9)  Coumadin 4 Mg Tabs (Warfarin Sodium) .... Use As Directed By Anticoagulation Clinic. 10)  Metronidazole 250 Mg Tabs (Metronidazole) .... One By Mouth Three Times A Day For 7 Days 11)  Ranitidine Hcl 300 Mg Caps (Ranitidine Hcl) .... One By Mouth At Bedtime For Heartburn  Allergies (verified): No Known Drug Allergies  Anticoagulation Management History:      Positive risk factors for bleeding include an age of 75 years or older.  The bleeding index is 'intermediate risk'.  Positive CHADS2 values include Age > 75 years old.  The start date was 03/12/1999.  Her last INR was 3.1.  Anticoagulation responsible provider: Clifton James MD,  Cristal Deer.  INR POC: 3.9.  Exp: 07/2010.    Anticoagulation Management Assessment/Plan:      The patient's current anticoagulation dose is Coumadin 3 mg tabs: Use as directed by anticoagulation clinic., Coumadin 4 mg tabs: use as directed by anticoagulation clinic..  The target INR is 2 - 3.  The next INR is due 07/08/2009.  Anticoagulation instructions were given to patient.  Results were reviewed/authorized by Loma Newton, PharmD.  She was notified by Loma Newton.         Prior Anticoagulation Instructions: INR 4.5 Hold coumadin dose for today and tomorrow. Continue on normal schedule on Saturday.Take 3mg  daily except take 4mg  on Tuesday and Thursday and Saturday.Return in 10 days.  Current Anticoagulation Instructions: INR = 3.9  Hold Monday's dose, take 1/2 of a 4 mg tablet on tuesday.  Then begin normal schedule: 3 mg talbet SMWF, take 4 mg tablet on TTS

## 2010-03-10 NOTE — Medication Information (Signed)
Summary: rov/tm  Anticoagulant Therapy  Managed by: Bethena Midget, RN, BSN PCP: [name] Supervising MD: Shirlee Latch MD, Cortavious Nix Indication 1: Atrial Fibrillation (ICD-427.31) Lab Used: [facility]  Site: [facility] INR POC 4.5 INR RANGE 2 - 3  Dietary changes: yes       Details: No salads, greens, on soup and crackers for last five days  Health status changes: yes       Details: fever,stomach flu  Bleeding/hemorrhagic complications: no    Recent/future hospitalizations: no    Any changes in medication regimen? yes       Details: Zantac dose change from 150mg  to 300mg  daily, and added metronidazole 250mg  tid for 7 days ,last dose Monday 06/16/09  Recent/future dental: no  Any missed doses?: no       Is patient compliant with meds? yes       Allergies: No Known Drug Allergies  Anticoagulation Management History:      The patient is taking warfarin and comes in today for a routine follow up visit.  Positive risk factors for bleeding include an age of 75 years or older.  The bleeding index is 'intermediate risk'.  Positive CHADS2 values include Age > 85 years old.  The start date was 03/12/1999.  Her last INR was 3.1.  Anticoagulation responsible provider: Shirlee Latch MD, Akyla Vavrek.  INR POC: 4.5.  Cuvette Lot#: [id number].  Exp: 07/2010.    Anticoagulation Management Assessment/Plan:      The patient's current anticoagulation dose is Coumadin 3 mg tabs: Use as directed by anticoagulation clinic., Coumadin 4 mg tabs: use as directed by anticoagulation clinic..  The target INR is 2 - 3.  The next INR is due 06/23/2009.  Anticoagulation instructions were given to patient.  Results were reviewed/authorized by Bethena Midget, RN, BSN.  She was notified by [facility].         Prior Anticoagulation Instructions: INR 2.4 Continue 3mg s daily except 4mg s on Tuesdays, Thursdays, and Saturdays. Recheck in 4 weeks.   Current Anticoagulation Instructions: INR 4.5 Hold coumadin dose for today  and tomorrow. Continue on normal schedule on Saturday.Take 3mg  daily except take 4mg  on Tuesday and Thursday and Saturday.Return in 10 days.

## 2010-03-10 NOTE — Medication Information (Signed)
Summary: rov/tm      Allergies Added: NKDA Anticoagulant Therapy  Managed by: Loma Newton, PharmD Supervising MD: Excell Seltzer MD, Casimiro Needle Indication 1: Atrial Fibrillation (ICD-427.31) Lab Used: [facility] Site: [facility] INR POC 3.1 INR RANGE 2 - 3  Dietary changes: yes       Details: new years greens  Health status changes: no    Bleeding/hemorrhagic complications: no    Recent/future hospitalizations: no    Any changes in medication regimen? no    Recent/future dental: no  Any missed doses?: no       Is patient compliant with meds? yes       Current Medications (verified): 1)  Levoxyl 88 Mcg Tabs (Levothyroxine Sodium) .... Take 1 Tablet By Mouth Once A Day 2)  Metoprolol Tartrate 50 Mg Tabs (Metoprolol Tartrate) .... 1/2am- 1/2 Pm 3)  Coumadin 3 Mg Tabs (Warfarin Sodium) .... Use As Directed By Anticoagulation Clinic. 4)  Tylenol Arthritis Pain 650 Mg Cr-Tabs (Acetaminophen) .... 2 Am, and 2 Tabs Pm..prn 5)  Centrium Silver .... Take 1 Tablet By Mouth Once A Day 6)  Calcium + Vitamin D 7)  Fiber 500mg  .... Qdtab 8)  Zantac 150 Mg Tabs (Ranitidine Hcl) .... Take 1 Tablet By Mouth Once A Day 9)  Loratadine 10 Mg Tabs (Loratadine) .... Take 1 Tablet By Mouth Once A Day 10)  Fosamax 70 Mg Tabs (Alendronate Sodium) .... Take 1 Once A Week 11)  Coumadin 4 Mg Tabs (Warfarin Sodium) .... Use As Directed By Anticoagulation Clinic.  Allergies (verified): No Known Drug Allergies  Anticoagulation Management History:      The patient is taking warfarin and comes in today for a routine follow up visit.  Positive risk factors for bleeding include an age of 60 years or older.  The bleeding index is 'intermediate risk'.  Positive CHADS2 values include Age > 65 years old.  The start date was 03/12/1999.  Today's INR is 3.1.  Anticoagulation responsible provider: Excell Seltzer MD, Casimiro Needle.  INR POC: 3.1.  Cuvette Lot#: [id number].  Exp: 03/2010.    Anticoagulation Management  Assessment/Plan:      The patient's current anticoagulation dose is Coumadin 3 mg tabs: Use as directed by anticoagulation clinic., Coumadin 4 mg tabs: use as directed by anticoagulation clinic..  The target INR is 2 - 3.  The next INR is due 03/10/2009.  Anticoagulation instructions were given to patient.  Results were reviewed/authorized by Loma Newton, PharmD.  She was notified by Loma Newton.         Prior Anticoagulation Instructions: INR 2.7 Continue 3mg s daily except 4mg s on Tuesdays, Thursdays, and Saturdays. Recheck in 4 weeks.   Current Anticoagulation Instructions: INR = 3.1 (goal =2-3)  Recheck in 4 weeks  The patient is to continue with the same dose of coumadin.  This dosage includes:  take one 3 mg tablets on Sunday, monday, wednesday, friday.  Take one 4mg  tablet on tuesday, thursday, saturday.

## 2010-03-10 NOTE — Assessment & Plan Note (Signed)
Summary: PER PT JAN APPT--STC   Vital Signs:  Patient profile:   77 year old female Height:      66 inches Weight:      147 pounds BMI:     23.81 O2 Sat:      99 % on Room air Temp:     97.6 degrees F oral Pulse rate:   90 / minute Pulse rhythm:   regular Resp:     16 per minute BP sitting:   112 / 70  (left arm) Cuff size:   regular  Vitals Entered By: Rock Nephew CMA (February 26, 2009 10:17 AM)  O2 Flow:  Room air  History of Present Illness: She returns for f/up and expresses concern about long term Fosamax therapy and atypical distal femur fractures. Otherwise she feels well and offers no complaints.  Preventive Screening-Counseling & Management  Alcohol-Tobacco     Alcohol drinks/day: 0     Smoking Status: never     Passive Smoke Exposure: no  Hep-HIV-STD-Contraception     HIV Risk: no      Drug Use:  no.    Current Medications (verified): 1)  Levoxyl 88 Mcg Tabs (Levothyroxine Sodium) .... Take 1 Tablet By Mouth Once A Day 2)  Metoprolol Tartrate 50 Mg Tabs (Metoprolol Tartrate) .... 1/2am- 1/2 Pm 3)  Coumadin 3 Mg Tabs (Warfarin Sodium) .... Use As Directed By Anticoagulation Clinic. 4)  Tylenol Arthritis Pain 650 Mg Cr-Tabs (Acetaminophen) .... Take 1 Tablet By Mouth Two Times A Day 5)  Centrium Silver .... Take 1 Tablet By Mouth Once A Day 6)  Calcium + Vitamin D .... Take 1 Tablet By Mouth Once A Day 7)  Fiber 500mg  .... Qdtab 8)  Zantac 150 Mg Tabs (Ranitidine Hcl) .... Take 1 Tablet By Mouth Once A Day 9)  Loratadine 10 Mg Tabs (Loratadine) .... Take 1 Tablet By Mouth Once A Day 10)  Fosamax 70 Mg Tabs (Alendronate Sodium) .... Take 1 Once A Week 11)  Coumadin 4 Mg Tabs (Warfarin Sodium) .... Use As Directed By Anticoagulation Clinic.  Allergies (verified): No Known Drug Allergies  Past History:  Past Medical History: Reviewed history from 02/26/2008 and no changes required. Hypothyroidism Osteoporosis Renal insufficiency (BUN=27 and Cr.=1.28  on 07/21/07) Thyroid Cancer Breast cancer, hx of A.fib, pacemaker MVP Left direct inguinal hernia Atrial fibrillation  Past Surgical History: Reviewed history from 02/26/2008 and no changes required. Thyroidectomy Mastectomy Bilat. in 1982 Tonsillectomy  Family History: Reviewed history from 02/26/2008 and no changes required. Family History of Arthritis Family History Hypertension Family History Lung cancer Family History of Stroke F 1st degree relative <60 Family History of Colon CA 1st degree relative <60  Social History: Reviewed history from 02/26/2008 and no changes required. Retired Conservation officer, nature Never Smoked Alcohol use-no Drug use-no Regular exercise-yes  Review of Systems  The patient denies anorexia, fever, weight loss, weight gain, chest pain, syncope, dyspnea on exertion, peripheral edema, prolonged cough, headaches, hemoptysis, abdominal pain, hematuria, suspicious skin lesions, depression, abnormal bleeding, enlarged lymph nodes, and breast masses.   CV:  Denies chest pain or discomfort, fainting, fatigue, lightheadness, near fainting, palpitations, shortness of breath with exertion, and swelling of feet. Endo:  Denies cold intolerance, excessive hunger, excessive thirst, excessive urination, heat intolerance, polyuria, and weight change.  Physical Exam  General:  The patient was alert and oriented in no acute distress.Neck veins were flat, carotids were brisk. Lungs were clear. Heart sounds were regular without murmurs or gallops. Abdomen was  soft with active bowel sounds. There is no clubbing cyanosis or edema. there is thrush that There is some swelling on the medial aspect of her right lower leg where she banged it against a flat of flowers. Head:  normocephalic, atraumatic, no abnormalities observed, and no abnormalities palpated.   Eyes:  No corneal or conjunctival inflammation noted. EOMI. Perrla. Funduscopic exam benign, without hemorrhages, exudates or  papilledema. Vision grossly normal. Mouth:  Oral mucosa and oropharynx without lesions or exudates.  Teeth in good repair. Neck:  supple, full ROM, no masses, no thyromegaly, no thyroid nodules or tenderness, and no JVD.   Lungs:  Normal respiratory effort, chest expands symmetrically. Lungs are clear to auscultation, no crackles or wheezes. Heart:  Normal rate and regular rhythm. S1 and S2 normal without gallop, murmur, click, rub or other extra sounds. Abdomen:  soft, non-tender, normal bowel sounds, no distention, no masses, no guarding, no rigidity, no rebound tenderness, no abdominal hernia, no inguinal hernia, no hepatomegaly, and no splenomegaly.   Msk:  No deformity or scoliosis noted of thoracic or lumbar spine.   Pulses:  R and L carotid,radial,femoral,dorsalis pedis and posterior tibial pulses are full and equal bilaterally Extremities:  No clubbing, cyanosis, edema, or deformity noted with normal full range of motion of all joints.   Neurologic:  No cranial nerve deficits noted. Station and gait are normal. Plantar reflexes are down-going bilaterally. DTRs are symmetrical throughout. Sensory, motor and coordinative functions appear intact. Skin:  Intact without suspicious lesions or rashes Cervical Nodes:  No lymphadenopathy noted Axillary Nodes:  no R axillary adenopathy and no L axillary adenopathy.   Psych:  Cognition and judgment appear intact. Alert and cooperative with normal attention span and concentration. No apparent delusions, illusions, hallucinations   Impression & Recommendations:  Problem # 1:  ATRIAL FIBRILLATION (ICD-427.31) Assessment Improved  Her updated medication list for this problem includes:    Metoprolol Tartrate 50 Mg Tabs (Metoprolol tartrate) .Marland Kitchen... 1/2am- 1/2 pm    Coumadin 3 Mg Tabs (Warfarin sodium) ..... Use as directed by anticoagulation clinic.    Coumadin 4 Mg Tabs (Warfarin sodium) ..... Use as directed by anticoagulation clinic.  Problem # 2:   OSTEOPOROSIS (ICD-733.00) Assessment: Unchanged  The following medications were removed from the medication list:    Fosamax 70 Mg Tabs (Alendronate sodium) .Marland Kitchen... Take 1 once a week  Orders: T-Bone Densitometry (16109) Venipuncture (60454) TLB-Lipid Panel (80061-LIPID) TLB-BMP (Basic Metabolic Panel-BMET) (80048-METABOL) TLB-CBC Platelet - w/Differential (85025-CBCD) TLB-Hepatic/Liver Function Pnl (80076-HEPATIC) TLB-TSH (Thyroid Stimulating Hormone) (84443-TSH) TLB-Udip w/ Micro (81001-URINE)  Discussed medication use  and exercises.   Problem # 3:  HYPOTHYROIDISM (ICD-244.9) Assessment: Unchanged  Her updated medication list for this problem includes:    Levoxyl 88 Mcg Tabs (Levothyroxine sodium) .Marland Kitchen... Take 1 tablet by mouth once a day  Orders: Venipuncture (09811) TLB-Lipid Panel (80061-LIPID) TLB-BMP (Basic Metabolic Panel-BMET) (80048-METABOL) TLB-CBC Platelet - w/Differential (85025-CBCD) TLB-Hepatic/Liver Function Pnl (80076-HEPATIC) TLB-TSH (Thyroid Stimulating Hormone) (84443-TSH) TLB-Udip w/ Micro (81001-URINE)  Labs Reviewed: TSH: 0.97 (02/26/2008)     Problem # 4:  RENAL INSUFFICIENCY (ICD-588.9) Assessment: Improved  Orders: Venipuncture (91478) TLB-Lipid Panel (80061-LIPID) TLB-BMP (Basic Metabolic Panel-BMET) (80048-METABOL) TLB-CBC Platelet - w/Differential (85025-CBCD) TLB-Hepatic/Liver Function Pnl (80076-HEPATIC) TLB-TSH (Thyroid Stimulating Hormone) (84443-TSH) TLB-Udip w/ Micro (81001-URINE)  Complete Medication List: 1)  Levoxyl 88 Mcg Tabs (Levothyroxine sodium) .... Take 1 tablet by mouth once a day 2)  Metoprolol Tartrate 50 Mg Tabs (Metoprolol tartrate) .... 1/2am- 1/2 pm 3)  Coumadin  3 Mg Tabs (Warfarin sodium) .... Use as directed by anticoagulation clinic. 4)  Tylenol Arthritis Pain 650 Mg Cr-tabs (Acetaminophen) .... Take 1 tablet by mouth two times a day 5)  Centrium Silver  .... Take 1 tablet by mouth once a day 6)  Calcium +  Vitamin D  .... Take 1 tablet by mouth once a day 7)  Fiber 500mg   .... Qdtab 8)  Zantac 150 Mg Tabs (Ranitidine hcl) .... Take 1 tablet by mouth once a day 9)  Loratadine 10 Mg Tabs (Loratadine) .... Take 1 tablet by mouth once a day 10)  Coumadin 4 Mg Tabs (Warfarin sodium) .... Use as directed by anticoagulation clinic.  Other Orders: Gastroenterology Referral (GI)  Colorectal Screening:  Current Recommendations:    Colonoscopy recommended: scheduled with G.I.  Colonoscopy Results:    Date of Exam: 09/07/2005    Results: Normal  Osteoporosis Risk Assessment:  Risk Factors for Fracture or Low Bone Density:   Race (White or Asian):     yes   Hx of Fractures:       no   FH of Osteoporosis:     yes   Hx of falls:       no   Physically inactive:     no   Smoking status:       never   High alcohol use:     no   High caffeine use:     no   Low calcium/Vit. D intake:   no   Corticosteroid use:     no   Thyroid replacement:     yes   Dilantin use:       no   Heparin use:       no   Thyroid disease:     yes   Rheumatoid Arthritis:     no   Parathyroid disease:     no  Bone Density (DEXA Scan) Results:    Date of Exam:  11/07/2007    Results:  Borderline Osteoporosis  Bone Density Comments:    lt forearm -3.1 lt femur    -1.6  Immunization & Chemoprophylaxis:    Influenza vaccine: Fluvax 3+  (12/13/2008)  Patient Instructions: 1)  Please schedule a follow-up appointment in 4 months. 2)  It is important that you exercise regularly at least 20 minutes 5 times a week. If you develop chest pain, have severe difficulty breathing, or feel very tired , stop exercising immediately and seek medical attention.

## 2010-03-10 NOTE — Medication Information (Signed)
Summary: rov/tm  Anticoagulant Therapy  Managed by: Elaina Pattee, PharmD PCP: [name] Supervising MD: Gala Romney MD, Reuel Boom Indication 1: Atrial Fibrillation (ICD-427.31) Lab Used: [facility] Site: [facility] INR POC 2.5 INR RANGE 2 - 3  Dietary changes: no    Health status changes: no    Bleeding/hemorrhagic complications: no    Recent/future hospitalizations: no    Any changes in medication regimen? no    Recent/future dental: no  Any missed doses?: no       Is patient compliant with meds? yes       Current Medications (verified): 1)  Levoxyl 88 Mcg Tabs (Levothyroxine Sodium) .... Take 1 Tablet By Mouth Once A Day 2)  Metoprolol Tartrate 50 Mg Tabs (Metoprolol Tartrate) .... 1/2am- 1/2 Pm 3)  Coumadin 3 Mg Tabs (Warfarin Sodium) .... Use As Directed By Anticoagulation Clinic. 4)  Tylenol Arthritis Pain 650 Mg Cr-Tabs (Acetaminophen) .... Take 1 Tablet By Mouth Two Times A Day 5)  Centrium Silver .... Take 1 Tablet By Mouth Once A Day 6)  Calcium + Vitamin D .... Take 1 Tablet By Mouth Once A Day 7)  Fiber 500mg  .... Qdtab 8)  Loratadine 10 Mg Tabs (Loratadine) .... Take 1 Tablet By Mouth Once A Day 9)  Coumadin 4 Mg Tabs (Warfarin Sodium) .... Use As Directed By Anticoagulation Clinic. 10)  Ranitidine Hcl 300 Mg Caps (Ranitidine Hcl) .... One By Mouth At Bedtime For Heartburn  Allergies (verified): No Known Drug Allergies  Anticoagulation Management History:      The patient is taking warfarin and comes in today for a routine follow up visit.  Positive risk factors for bleeding include an age of 75 years or older.  The bleeding index is 'intermediate risk'.  Positive CHADS2 values include Age > 37 years old.  The start date was 03/12/1999.  Her last INR was 3.1.  Anticoagulation responsible provider: Halbert Jesson MD, Reuel Boom.  INR POC: 2.5.  Cuvette Lot#: [id number].  Exp: 09/2010.    Anticoagulation Management Assessment/Plan:      The patient's current  anticoagulation dose is Coumadin 3 mg tabs: Use as directed by anticoagulation clinic., Coumadin 4 mg tabs: use as directed by anticoagulation clinic..  The target INR is 2 - 3.  The next INR is due 09/02/2009.  Anticoagulation instructions were given to patient.  Results were reviewed/authorized by Elaina Pattee, PharmD.  She was notified by Elaina Pattee, PharmD.         Prior Anticoagulation Instructions: INR 2.5 Continue 3mg s everyday except 4mg s on Tuesdays, Thursdays and Saturdays. Recheck in 4 weeks.   Current Anticoagulation Instructions: INR 2.5. Take 3 mg on Sun, Mon, Wed, and Fri, and 4 mg on Tues, Thurs, and Sat. Recheck in 4 weeks.

## 2010-03-10 NOTE — Progress Notes (Signed)
Summary: elevated potassium  Phone Note Outgoing Call   Summary of Call: LA:  her potassium level was very high on the labs done yesterday, please ask her to come in asap for an EKG to see if the heart is being affected and a repeat potassium level  Thanks, TJ Initial call taken by: [name],  October 15, 2009 7:41 AM  Follow-up for Phone Call        Spoke to pt and advised her per Dr. Yetta Barre instruction. Pt voices understanding and transferred to [facility] for appt. Nicki Guadalajara Fergerson CMA Duncan Dull)  October 15, 2009 10:35 AM  Follow-up by: [name],  October 15, 2009 7:40 AM

## 2010-03-10 NOTE — Progress Notes (Signed)
  Phone Note Refill Request   Refills Requested: Medication #1:  amoxil 500mg    Notes: tajke four by mouth one hour prior to appt.   Rx denied, pt will need to contact office and advise why this is needed. Last given by Lourena Simmonds MD. Pharmacy notified via fax  Initial call taken by: Rock Nephew CMA,  May 21, 2009 3:50 PM

## 2010-03-10 NOTE — Medication Information (Signed)
Summary: rov/cb  Anticoagulant Therapy  Managed by: Cloyde Reams, RN, BSN PCP: [name] Supervising MD: Juanda Chance MD, Kaiven Vester Indication 1: Atrial Fibrillation (ICD-427.31) Lab Used: [facility] Site: [facility] INR POC 2.2 INR RANGE 2 - 3  Dietary changes: no    Health status changes: no    Bleeding/hemorrhagic complications: no    Recent/future hospitalizations: no    Any changes in medication regimen? no    Recent/future dental: no  Any missed doses?: no       Is patient compliant with meds? yes       Allergies: No Known Drug Allergies  Anticoagulation Management History:      The patient is taking warfarin and comes in today for a routine follow up visit.  Positive risk factors for bleeding include an age of 75 years or older.  The bleeding index is 'intermediate risk'.  Positive CHADS2 values include Age > 19 years old.  The start date was 03/12/1999.  Her last INR was 3.1.  Anticoagulation responsible provider: Juanda Chance MD, Smitty Cords.  INR POC: 2.2.  Cuvette Lot#: [id number].  Exp: 11/2010.    Anticoagulation Management Assessment/Plan:      The patient's current anticoagulation dose is Coumadin 3 mg tabs: Use as directed by anticoagulation clinic., Coumadin 4 mg tabs: use as directed by anticoagulation clinic..  The target INR is 2 - 3.  The next INR is due 09/30/2009.  Anticoagulation instructions were given to patient.  Results were reviewed/authorized by Cloyde Reams, RN, BSN.  She was notified by Cloyde Reams, RN, BSN.         Prior Anticoagulation Instructions: INR 2.5. Take 3 mg on Sun, Mon, Wed, and Fri, and 4 mg on Tues, Thurs, and Sat. Recheck in 4 weeks.  Current Anticoagulation Instructions: INR 2.2  Continue on same dosage 3mg  daily except 4mg  on Tuesdays, Thursdays, and Saturdays.  Recheck in 4 weeks.

## 2010-03-10 NOTE — Assessment & Plan Note (Signed)
Summary: elev potassium/cd--Rm 8   Vital Signs:  Patient profile:   77 year old female Height:      66 inches Weight:      149.75 pounds O2 Sat:      94 % on Room air Temp:     96.6 degrees F oral Pulse rate:   71 / minute Pulse rhythm:   regular Resp:     18 per minute BP sitting:   110 / 60  (left arm) Cuff size:   regular  Vitals Entered By: Mervin Kung CMA Duncan Dull) (October 15, 2009 2:24 PM)  O2 Flow:  Room air CC: Rm 8  Pt returns for follow up of elevated potassium. Is Patient Diabetic? No   Primary Care Provider:  [name]  CC:  Rm 8  Pt returns for follow up of elevated potassium.Marland Kitchen  History of Present Illness: She returns to f/up on labs done yesterday that show a high K+ level. She feels well and has no answer as to what might be causing this.  Preventive Screening-Counseling & Management  Alcohol-Tobacco     Alcohol drinks/day: 0     Smoking Status: never     Passive Smoke Exposure: no     Tobacco Counseling: not indicated; no tobacco use  Clinical Review Panels:  Lipid Management   Cholesterol:  156 (02/26/2009)   LDL (bad choesterol):  99 (02/26/2009)   HDL (good cholesterol):  45.20 (02/26/2009)  Diabetes Management   Creatinine:  1.2 (10/14/2009)   Last Flu Vaccine:  Fluvax 3+ (12/13/2008)  CBC   WBC:  6.7 (10/14/2009)   RBC:  4.22 (10/14/2009)   Hgb:  13.3 (10/14/2009)   Hct:  39.2 (10/14/2009)   Platelets:  198.0 (10/14/2009)   MCV  92.8 (10/14/2009)   MCHC  34.1 (10/14/2009)   RDW  12.8 (10/14/2009)   PMN:  63.0 (10/14/2009)   Lymphs:  19.0 (10/14/2009)   Monos:  8.6 (10/14/2009)   Eosinophils:  8.3 (10/14/2009)   Basophil:  1.1 (10/14/2009)  Complete Metabolic Panel   Glucose:  88 (10/14/2009)   Sodium:  139 (10/14/2009)   Potassium:  5.7 (10/14/2009)   Chloride:  101 (10/14/2009)   CO2:  30 (10/14/2009)   BUN:  24 (10/14/2009)   Creatinine:  1.2 (10/14/2009)   Albumin:  4.2 (10/14/2009)   Total Protein:  6.5  (10/14/2009)   Calcium:  9.1 (10/14/2009)   Total Bili:  0.7 (10/14/2009)   Alk Phos:  58 (10/14/2009)   SGPT (ALT):  18 (10/14/2009)   SGOT (AST):  24 (10/14/2009)   Medications Prior to Update: 1)  Levoxyl 88 Mcg Tabs (Levothyroxine Sodium) .... Take 1 Tablet By Mouth Once A Day 2)  Metoprolol Tartrate 50 Mg Tabs (Metoprolol Tartrate) .... 1/2am- 1/2 Pm 3)  Coumadin 3 Mg Tabs (Warfarin Sodium) .... Use As Directed By Anticoagulation Clinic. 4)  Tylenol Arthritis Pain 650 Mg Cr-Tabs (Acetaminophen) .... Take 2 Tablet By Mouth 5)  Centrium Silver .... Take 1 Tablet By Mouth Once A Day 6)  Calcium + Vitamin D .... Take 1 Tablet By Mouth Once A Day 7)  Fiber 500mg  .... 2 Tabs Daily 8)  Loratadine 10 Mg Tabs (Loratadine) .... Take 1 Tablet By Mouth Once A Day 9)  Coumadin 4 Mg Tabs (Warfarin Sodium) .... Use As Directed By Anticoagulation Clinic. 10)  Ranitidine Hcl 300 Mg Caps (Ranitidine Hcl) .... One By Mouth At Bedtime For Heartburn  Current Medications (verified): 1)  Levoxyl  88 Mcg Tabs (Levothyroxine Sodium) .... Take 1 Tablet By Mouth Once A Day 2)  Metoprolol Tartrate 50 Mg Tabs (Metoprolol Tartrate) .... 1/2am- 1/2 Pm 3)  Coumadin 3 Mg Tabs (Warfarin Sodium) .... Use As Directed By Anticoagulation Clinic. 4)  Tylenol Arthritis Pain 650 Mg Cr-Tabs (Acetaminophen) .... Take 2 Tablet By Mouth 5)  Centrium Silver .... Take 1 Tablet By Mouth Once A Day 6)  Calcium + Vitamin D .... Take 1 Tablet By Mouth Once A Day 7)  Fiber 500mg  .... 2 Tabs Daily 8)  Loratadine 10 Mg Tabs (Loratadine) .... Take 1 Tablet By Mouth Once A Day 9)  Coumadin 4 Mg Tabs (Warfarin Sodium) .... Use As Directed By Anticoagulation Clinic. 10)  Ranitidine Hcl 300 Mg Caps (Ranitidine Hcl) .... One By Mouth At Bedtime For Heartburn  Allergies (verified): No Known Drug Allergies  Past History:  Past Medical History: Last updated: 02/26/2008 Hypothyroidism Osteoporosis Renal insufficiency (BUN=27 and  Cr.=1.28 on 07/21/07) Thyroid Cancer Breast cancer, hx of A.fib, pacemaker MVP Left direct inguinal hernia Atrial fibrillation  Past Surgical History: Last updated: 02/26/2008 Thyroidectomy Mastectomy Bilat. in 1982 Tonsillectomy  Family History: Last updated: 02/26/2009 Family History of Arthritis Family History Hypertension Family History Lung cancer Family History of Stroke F 1st degree relative <60 Family History of Colon CA 1st degree relative <60  Social History: Last updated: 02/26/2008 Retired Widow/Widower Never Smoked Alcohol use-no Drug use-no Regular exercise-yes  Risk Factors: Alcohol Use: 0 (10/15/2009) Exercise: yes (02/26/2008)  Risk Factors: Smoking Status: never (10/15/2009) Passive Smoke Exposure: no (10/15/2009)  Family History: Reviewed history from 02/26/2009 and no changes required. Family History of Arthritis Family History Hypertension Family History Lung cancer Family History of Stroke F 1st degree relative <60 Family History of Colon CA 1st degree relative <60  Social History: Reviewed history from 02/26/2008 and no changes required. Retired Conservation officer, nature Never Smoked Alcohol use-no Drug use-no Regular exercise-yes  Review of Systems  The patient denies anorexia, fever, weight loss, weight gain, chest pain, syncope, dyspnea on exertion, peripheral edema, prolonged cough, headaches, abdominal pain, and hematuria.   GU:  Complains of urinary frequency; denies dysuria, hematuria, nocturia, and urinary hesitancy.  Physical Exam  General:  alert, well-developed, well-nourished, well-hydrated, appropriate dress, normal appearance, healthy-appearing, and cooperative to examination.   Mouth:  Oral mucosa and oropharynx without lesions or exudates.  Teeth in good repair. Neck:  supple, full ROM, no masses, no thyromegaly, no JVD, normal carotid upstroke, no carotid bruits, no cervical lymphadenopathy, and no neck tenderness.   Lungs:   normal respiratory effort, no intercostal retractions, no accessory muscle use, normal breath sounds, no dullness, no fremitus, no crackles, and no wheezes.   Heart:  normal rate, regular rhythm, no murmur, no gallop, no rub, and no JVD.   Abdomen:  soft, non-tender, normal bowel sounds, no distention, no masses, no guarding, no rigidity, no rebound tenderness, no abdominal hernia, no inguinal hernia, no hepatomegaly, and no splenomegaly.   Msk:  normal ROM, no joint tenderness, no joint swelling, no joint warmth, no redness over joints, no joint deformities, no joint instability, and no crepitation.   Pulses:  R and L carotid,radial,femoral,dorsalis pedis and posterior tibial pulses are full and equal bilaterally Extremities:  No clubbing, cyanosis, edema, or deformity noted with normal full range of motion of all joints.   Neurologic:  No cranial nerve deficits noted. Station and gait are normal. Plantar reflexes are down-going bilaterally. DTRs are symmetrical throughout. Sensory, motor and coordinative  functions appear intact. Skin:  turgor normal, color normal, no rashes, no suspicious lesions, no ecchymoses, no petechiae, no purpura, no ulcerations, and no edema.   Psych:  Cognition and judgment appear intact. Alert and cooperative with normal attention span and concentration. No apparent delusions, illusions, hallucinations   Impression & Recommendations:  Problem # 1:  HYPERKALEMIA (ICD-276.7) Assessment New her EKG shows a very slight peaking of the T waves but is o.w normal, will treat with kayex and sorbitol, recheck K+ today and look at Adrenal function with a random cortisol level Orders: TLB-BMP (Basic Metabolic Panel-BMET) (80048-METABOL) TLB-Cortisol (82533-CORT) EKG w/ Interpretation (93000)  Problem # 2:  RENAL INSUFFICIENCY (ICD-588.9) Assessment: Improved  Problem # 3:  HYPOTHYROIDISM (ICD-244.9) Assessment: Unchanged  Her updated medication list for this problem  includes:    Levoxyl 88 Mcg Tabs (Levothyroxine sodium) .Marland Kitchen... Take 1 tablet by mouth once a day  Labs Reviewed: TSH: 1.56 (10/14/2009)    Chol: 156 (02/26/2009)   HDL: 45.20 (02/26/2009)   LDL: 99 (02/26/2009)   TG: 60.0 (02/26/2009)  Complete Medication List: 1)  Levoxyl 88 Mcg Tabs (Levothyroxine sodium) .... Take 1 tablet by mouth once a day 2)  Metoprolol Tartrate 50 Mg Tabs (Metoprolol tartrate) .... 1/2am- 1/2 pm 3)  Coumadin 3 Mg Tabs (Warfarin sodium) .... Use as directed by anticoagulation clinic. 4)  Tylenol Arthritis Pain 650 Mg Cr-tabs (Acetaminophen) .... Take 2 tablet by mouth 5)  Centrium Silver  .... Take 1 tablet by mouth once a day 6)  Calcium + Vitamin D  .... Take 1 tablet by mouth once a day 7)  Fiber 500mg   .... 2 tabs daily 8)  Loratadine 10 Mg Tabs (Loratadine) .... Take 1 tablet by mouth once a day 9)  Coumadin 4 Mg Tabs (Warfarin sodium) .... Use as directed by anticoagulation clinic. 10)  Ranitidine Hcl 300 Mg Caps (Ranitidine hcl) .... One by mouth at bedtime for heartburn 11)  Kionex 15 Gm/69ml Susp (Sodium polystyrene sulfonate) .... 60 ml with sorbitol by mouth once  Patient Instructions: 1)  Please schedule a follow-up appointment in 2 weeks. Prescriptions: KIONEX 15 GM/60ML SUSP (SODIUM POLYSTYRENE SULFONATE) 60 ml with sorbitol by mouth once  #1 dose x 1   Entered and Authorized by:   [name]   Signed by:   [name] on 10/15/2009   Method used:   Electronically to        [facility]  [phone]* (retail)       [address]       [place], [place]  [address]       Ph: [phone] or [phone]       Fax: [phone]   RxID:   [id number]   Current Allergies (reviewed today): No known allergies

## 2010-03-10 NOTE — Letter (Signed)
Summary: Results Follow-up Letter  [facility]  [address] [place], [place] [address]   Phone: [phone]  Fax: [phone]    02/26/2009  [id number] [place], [place]  [address]  Dear [name],   The following are the results of your recent test(s):  Test     Result     Liver/kidney   normal Urine       normal CBC       normal Thyroid     normal   _________________________________________________________  Please call for an appointment as directed _________________________________________________________ _________________________________________________________ _________________________________________________________  Sincerely,  Sanda Linger MD [facility]

## 2010-03-10 NOTE — Miscellaneous (Signed)
Summary: BONE DENSITY  Clinical Lists Changes  Orders: Added new Test order of T-Lumbar Vertebral Assessment (77082) - Signed 

## 2010-03-10 NOTE — Medication Information (Signed)
Summary: ccr  Anticoagulant Therapy  Managed by: Bethena Midget, RN, BSN Supervising MD: Daleen Squibb MD, Maisie Fus Indication 1: Atrial Fibrillation (ICD-427.31) Lab Used: [facility] Site: [facility] INR POC 3.0 INR RANGE 2 - 3  Dietary changes: no    Health status changes: no    Bleeding/hemorrhagic complications: no    Recent/future hospitalizations: no    Any changes in medication regimen? no    Recent/future dental: no  Any missed doses?: no       Is patient compliant with meds? yes       Allergies: No Known Drug Allergies  Anticoagulation Management History:      The patient is taking warfarin and comes in today for a routine follow up visit.  Positive risk factors for bleeding include an age of 75 years or older.  The bleeding index is 'intermediate risk'.  Positive CHADS2 values include Age > 9 years old.  The start date was 03/12/1999.  Her last INR was 3.1.  Anticoagulation responsible provider: Daleen Squibb MD, Maisie Fus.  INR POC: 3.0.  Cuvette Lot#: [id number].  Exp: 05/2010.    Anticoagulation Management Assessment/Plan:      The patient's current anticoagulation dose is Coumadin 3 mg tabs: Use as directed by anticoagulation clinic., Coumadin 4 mg tabs: use as directed by anticoagulation clinic..  The target INR is 2 - 3.  The next INR is due 04/17/2009.  Anticoagulation instructions were given to patient.  Results were reviewed/authorized by Bethena Midget, RN, BSN.  She was notified by Bethena Midget, RN, BSN.         Prior Anticoagulation Instructions: INR = 3.1 (goal =2-3)  Recheck in 4 weeks  The patient is to continue with the same dose of coumadin.  This dosage includes:  take one 3 mg tablets on Sunday, monday, wednesday, friday.  Take one 4mg  tablet on tuesday, thursday, saturday.   Current Anticoagulation Instructions: INR 3.0 Today take 3mg s then resume 3mg s everyday except 4mg s on Tuesdays, Thursdays and Saturdays. Recheck in 4 week.

## 2010-03-10 NOTE — Letter (Signed)
Summary: New Patient letter  [facility]  [address] [place], [place] [address]   Phone: [phone]  Fax: [phone]       10/15/2009 MRN: [id number]  Alyssa [name] [address] [place], [place]  [address]  Dear Alyssa [name],  Welcome to the Gastroenterology Division at [facility].    You are scheduled to see Dr.  Leone Payor on 12-01-09 at 10:45A.M. on the 3rd floor at [facility], [address]  We ask that you try to arrive at our office 15 minutes prior to your appointment time to allow for check-in.  We would like you to complete the enclosed self-administered evaluation form prior to your visit and bring it with you on the day of your appointment.  We will review it with you.  Also, please bring a complete list of all your medications or, if you prefer, bring the medication bottles and we will list them.  Please bring your insurance card so that we may make a copy of it.  If your insurance requires a referral to see a specialist, please bring your referral form from your primary care physician.  Co-payments are due at the time of your visit and may be paid by cash, check or credit card.     Your office visit will consist of a consult with your physician (includes a physical exam), any laboratory testing he/she may order, scheduling of any necessary diagnostic testing (e.g. x-ray, ultrasound, CT-scan), and scheduling of a procedure (e.g. Endoscopy, Colonoscopy) if required.  Please allow enough time on your schedule to allow for any/all of these possibilities.    If you cannot keep your appointment, please call [phone] to cancel or reschedule prior to your appointment date.  This allows [place] the opportunity to schedule an appointment for another patient in need of care.  If you do not cancel or reschedule by 5 p.m. the business day prior to your appointment date, you will be charged a $50.00 late cancellation/no-show fee.    Thank you for choosing  [facility] for your medical needs.  We appreciate the opportunity to care for you.  Please visit [place] at our website  to learn more about our practice.                     Sincerely,                                                             The Gastroenterology Division

## 2010-03-10 NOTE — Progress Notes (Signed)
Summary: Fosamax  Phone Note Call from Patient   Summary of Call: Pt was told to hold fosamax at last office visit, should she resume taking med?  Initial call taken by: Lamar Sprinkles, CMA,  March 21, 2009 10:19 AM  Follow-up for Phone Call        if she has been on Fosamax for more than 5 years she should stop it Follow-up by: [name],  March 21, 2009 11:01 AM  Additional Follow-up for Phone Call Additional follow up Details #1::        left mess for pt with above Additional Follow-up by: Lamar Sprinkles, CMA,  March 21, 2009 11:46 AM

## 2010-03-10 NOTE — Progress Notes (Signed)
Summary: CALL  Phone Note Call from Patient   Summary of Call: Patient is requesting a call back. Wants to inform someone about her colonoscopy apt.  Initial call taken by: Lamar Sprinkles, CMA,  March 04, 2009 3:32 PM  Follow-up for Phone Call        left message on voicemail to call back to office. Follow-up by: Lucious Groves,  March 04, 2009 3:46 PM  Additional Follow-up for Phone Call Additional follow up Details #1::        left mess to call office back.......................Marland KitchenLamar Sprinkles, CMA  March 07, 2009 11:55 AM   Spoke with patient, she wants to know when her last colonoscopy was. Pt says that Dr Yetta Barre had her previous records and was going to inform her of that information.  Pt had a note on her calendar that colon was due 2012, she is worried to wait too long from last due to family history, but also does not want to do it too soon.  Additional Follow-up by: Lamar Sprinkles, CMA,  March 07, 2009 1:57 PM    Additional Follow-up for Phone Call Additional follow up Details #2::    last colonoscopy was 08/2005 and it was normal Follow-up by: [name],  March 07, 2009 2:03 PM  Additional Follow-up for Phone Call Additional follow up Details #3:: Details for Additional Follow-up Action Taken: When should she have f/u colonoscopy?? Is 2012 ok? ............................Marland KitchenLamar Sprinkles, CMA  March 07, 2009 3:36 PM   yes, 2012 is fine Additional Follow-up by: [name],  March 07, 2009 3:40 PM   Pt informed, .....................sarah douglas cma

## 2010-03-10 NOTE — Cardiovascular Report (Signed)
Summary: Office Visit   Office Visit   Imported By: Roderic Ovens 06/09/2009 15:27:53  _____________________________________________________________________  External Attachment:    Type:   Image     Comment:   External Document

## 2010-03-10 NOTE — Medication Information (Signed)
Summary: rov/tm  Anticoagulant Therapy  Managed by: Cloyde Reams, RN, BSN Supervising MD: Daleen Squibb MD, Maisie Fus Indication 1: Atrial Fibrillation (ICD-427.31) Lab Used: [facility] Site: [facility] INR POC 2.7 INR RANGE 2 - 3  Dietary changes: no    Health status changes: no    Bleeding/hemorrhagic complications: no    Recent/future hospitalizations: no    Any changes in medication regimen? no    Recent/future dental: no  Any missed doses?: no       Is patient compliant with meds? yes       Allergies (verified): No Known Drug Allergies  Anticoagulation Management History:      The patient is taking warfarin and comes in today for a routine follow up visit.  Positive risk factors for bleeding include an age of 75 years or older.  The bleeding index is 'intermediate risk'.  Positive CHADS2 values include Age > 77 years old.  The start date was 03/12/1999.  Her last INR was 3.1.  Anticoagulation responsible provider: Daleen Squibb MD, Maisie Fus.  INR POC: 2.7.  Cuvette Lot#: [id number].  Exp: 06/2010.    Anticoagulation Management Assessment/Plan:      The patient's current anticoagulation dose is Coumadin 3 mg tabs: Use as directed by anticoagulation clinic., Coumadin 4 mg tabs: use as directed by anticoagulation clinic..  The target INR is 2 - 3.  The next INR is due 05/15/2009.  Anticoagulation instructions were given to patient.  Results were reviewed/authorized by Cloyde Reams, RN, BSN.  She was notified by Cloyde Reams RN.         Prior Anticoagulation Instructions: INR 3.0 Today take 3mg s then resume 3mg s everyday except 4mg s on Tuesdays, Thursdays and Saturdays. Recheck in 4 week.   Current Anticoagulation Instructions: INR 2.7  Continue on same dosage 3mg  daily except 4mg  on Tuesdays, Thursdays, and Saturdays.  Recheck in 4 weeks.

## 2010-03-10 NOTE — Assessment & Plan Note (Signed)
Summary: abd pain//RLQ PAIN--CH.    History of Present Illness Visit Type: Initial Consult Primary GI MD: Stan Head MD [facility] Primary Provider: [name] Requesting Provider: [name] Chief Complaint: RLQ abdominal pain History of Present Illness:   Patient has a family hx of colon Cancer in her father.She states that she is not due for colonscopy until 2012, per Dr Virginia Rochester.  She is having RLQ pain that is not constant.  Variable intensity, mild to severe. Chronic issue. Has discussed with physicians before. She is a little more aware of it. It occurs a few times month, lasts 30 minutes and resolves spontaneousl. "I have a lot of gas" ? if needs colonoscopy sooner. Father had colon cancer at 75. Chronic constpation x years mostly but has had some chronic diarrhea also in past. Since then she has had regular uncomplicated defecation.   GI Review of Systems    Reports abdominal pain.     Location of  Abdominal pain: RLQ.    Denies acid reflux, belching, bloating, chest pain, dysphagia with liquids, dysphagia with solids, heartburn, loss of appetite, nausea, vomiting, vomiting blood, weight loss, and  weight gain.      Reports irritable bowel syndrome.     Denies anal fissure, black tarry stools, change in bowel habit, constipation, diarrhea, diverticulosis, fecal incontinence, heme positive stool, hemorrhoids, jaundice, light color stool, liver problems, rectal bleeding, and  rectal pain. Colonoscopy  Procedure date:  10/28/2005  Findings:      FINDINGS:  Internal hemorrhoids, significant diverticulosis in the sigmoid colon, otherwise an unremarkable exam.  EGD  Procedure date:  09/06/2000  Findings:      normal upper endoscopic examination.  Dr. Virginia Rochester  Colonoscopy  Procedure date:  09/06/2000  Findings:      1. Essentially normal-appearing mucosa.  Random biopsy taken because    of the history of diarrhea, although it has now subsided. BENIGN MUCOSA 2.  Diverticulosis of the sigmoid colon. 3. Internal hemorrhoids. 4. Otherwise unremarkable examination.  Procedures Next Due Date:    Colonoscopy: 11/2010     Current Medications (verified): 1)  Levoxyl 88 Mcg Tabs (Levothyroxine Sodium) .... Take 1 Tablet By Mouth Once A Day 2)  Metoprolol Tartrate 50 Mg Tabs (Metoprolol Tartrate) .... 1/2am- 1/2 Pm 3)  Coumadin 3 Mg Tabs (Warfarin Sodium) .... Use As Directed By Anticoagulation Clinic. 4)  Tylenol Arthritis Pain 650 Mg Cr-Tabs (Acetaminophen) .... Take 2 Tablet By Mouth Two Times A Day 5)  Centrium Silver .... Take 1 Tablet By Mouth Once A Day 6)  Calcium + Vitamin D .... Take 1 Tablet By Mouth Once A Day 7)  Fiber 500mg  .... 2 Tabs Daily 8)  Loratadine 10 Mg Tabs (Loratadine) .... Take 1 Tablet By Mouth Once A Day 9)  Coumadin 4 Mg Tabs (Warfarin Sodium) .... Use As Directed By Anticoagulation Clinic. 10)  Ranitidine Hcl 300 Mg Caps (Ranitidine Hcl) .... One By Mouth At Bedtime For Heartburn 11)  Kionex 15 Gm/1ml Susp (Sodium Polystyrene Sulfonate) .... 60 Ml With Sorbitol By Mouth Once (Unsure If She Needs To Take)  Allergies (verified): No Known Drug Allergies  Past History:  Past Medical History: Hypothyroidism Osteoporosis Renal insufficiency (BUN=27 and Cr.=1.28 on 07/21/07) Thyroid Cancer Breast cancer, hx of A.fib, pacemaker MVP Left direct inguinal hernia Atrial fibrillation IBS  Past Surgical History: Thyroidectomy Mastectomy Bilat. in 1982 Tonsillectomy Pacemaker   Family History: Family History of Arthritis Family History Hypertension Family History of Stroke  F 1st degree relative: mother Family History of Colon CA 1st degree relative: father dxed about age 49  Social History: Retired Widow/Widower Never Smoked Alcohol use-no Drug use-no Regular exercise-yes Daily Caffeine Use sometimes  Review of Systems       The patient complains of arthritis/joint pain, back pain, hearing problems, heart  rhythm changes, shortness of breath, urination - excessive, and urine leakage.    Vital Signs:  Patient profile:   77 year old female Height:      66 inches Weight:      156.8 pounds BMI:     25.40 Pulse rate:   70 / minute Pulse rhythm:   regular BP sitting:   112 / 62  (left arm) Cuff size:   regular  Vitals Entered By: Harlow Mares CMA Duncan Dull) (December 01, 2009 11:07 AM)  Physical Exam  General:  elderly, well-preserved  old records and recent labs in EMR reviewed  Impression & Recommendations:  Problem # 1:  ABDOMINAL PAIN, RIGHT LOWER QUADRANT (ICD-789.03) Assessment Comment Only New for me to evaluate: seems chrionic and mild and I think IBS in origin. Will try align  Problem # 2:  IRRITABLE BOWEL SYNDROME (ICD-564.1) Assessment: New Align will be tried for mild RLQ pain, gas sxs as well as diet changes (less roughage) She has had chronic constipatuion and chronic fdiarrheas issue in the past without those issues now. Colonoscopies with diverticulosis and hemorrhoids. Suspect she may have a component of small bowel bacterial overgrowth which I have not treated with antibiotics due to warfarin use.  Problem # 3:  FAMILY HISTORY OF COLON CA 1ST DEGREE RELATIVE <60 (ICD-V16.0) Assessment: New Father was elderly she will be same age when he was diagnosed next year, 5 years after last colonoscopy will reassess at that time she remains vigorous but the colonoscopy may not be necessary given father's advanced age at dx and her two + previously negative colonoscopies   Problem # 4:  COUMADIN THERAPY (ICD-V58.61) Assessment: Comment Only  Patient Instructions: 1)  Try Align 1 once daily for gas and bloating. 2)  Take 1 Align capsule daily for 1 month. If it helped you may take it 1 month at a time as needed in a pulse fashion, or you may find you need it every day, chronically. It is available over the counter. 3)  Reduce roughage and gas-forming foods. See handout. 4)   Next routine colonoscopy due in 11/2010. We will place you in our computer list. 5)  Copy sent to : Sanda Linger, MD 6)  The medication list was reviewed and reconciled.  All changed / newly prescribed medications were explained.  A complete medication list was provided to the patient / caregiver.   Immunizations Administered:  Influenza Vaccine # 1:    Vaccine Type: Fluvirin    Site: right deltoid    Mfr: Novartis    Dose: 0.5 ml    Route: IM    Given by: Francee Piccolo CMA (AAMA)    Exp. Date: 07/2010    Lot #: [id number]    VIS given: 09/02/09 version given December 01, 2009.  Flu Vaccine Consent Questions:    Do you have a history of severe allergic reactions to this vaccine? no    Any prior history of allergic reactions to egg and/or gelatin? no    Do you have a sensitivity to the preservative Thimersol? no    Do you have a past history of Guillan-Barre Syndrome? no  Do you currently have an acute febrile illness? no    Have you ever had a severe reaction to latex? no    Vaccine information given and explained to patient? yes    Are you currently pregnant? no

## 2010-03-10 NOTE — Op Note (Signed)
Summary: Colonoscopy: Dr. Virginia Rochester  NAME:  Alyssa [name], Alyssa [name]                  ACCOUNT NO.:  [id number]   MEDICAL RECORD NO.:  [id number]          PATIENT TYPE:  AMB   LOCATION:  ENDO                         FACILITY:  [facility]   PHYSICIAN:  Georgiana Spinner, M.D.    DATE OF BIRTH:  January 31, 1925   DATE OF PROCEDURE:  DATE OF DISCHARGE:                                 OPERATIVE REPORT   PROCEDURE:  Colonoscopy.   INDICATIONS:  Colon cancer screening.   ANESTHESIA:  Demerol 50, Versed 5 mg.   PROCEDURE:  With patient mildly sedated in the left lateral decubitus  position, the Olympus videoscopic colonoscope was inserted into the rectum  and passed through a rather tight sigmoid area.  The lumen was difficult to  find.  There were numerous deep diverticula that we photographed, and then  we were able to advance with pressure applied, to reach the cecum identified  by the ileocecal valve and base of cecum, both of which were photographed.  From this point, the colonoscope was slowly withdrawn, taking  circumferential views of colonic mucosa, stopping only in the rectum which  appeared normal, and the rectum showed hemorrhoids in retroflex view.  The  endoscope was straightened and withdrawn.  Patient's vital signs, pulse  oximeter, remained stable.  Patient tolerated the procedure well without  apparent complications.   FINDINGS:  Internal hemorrhoids, significant diverticulosis in the sigmoid  colon, otherwise an unremarkable exam.   PLAN:  Repeat in 5 years.           ______________________________  Georgiana Spinner, M.D.     GMO/MEDQ  D:  10/28/2005  T:  10/28/2005  Job:  [id number]

## 2010-03-10 NOTE — Medication Information (Signed)
Summary: rov/ej  Anticoagulant Therapy  Managed by: Bethena Midget, RN, BSN PCP: [name] Supervising MD: Johney Frame MD, Fayrene Fearing Indication 1: Atrial Fibrillation (ICD-427.31) Lab Used: [facility] Site: [facility] INR POC 2.6 INR RANGE 2 - 3  Dietary changes: no    Health status changes: no    Bleeding/hemorrhagic complications: no    Recent/future hospitalizations: no    Any changes in medication regimen? yes       Details: Starting Allign today ( probiotic)  Recent/future dental: no  Any missed doses?: no       Is patient compliant with meds? yes       Allergies: No Known Drug Allergies  Anticoagulation Management History:      The patient is taking warfarin and comes in today for a routine follow up visit.  Positive risk factors for bleeding include an age of 75 years or older.  The bleeding index is 'intermediate risk'.  Positive CHADS2 values include Age > 45 years old.  The start date was 03/12/1999.  Her last INR was 3.1.  Anticoagulation responsible provider: Emerie Vanderkolk MD, Fayrene Fearing.  INR POC: 2.6.  Cuvette Lot#: [id number].  Exp: 01/2011.    Anticoagulation Management Assessment/Plan:      The patient's current anticoagulation dose is Coumadin 3 mg tabs: Use as directed by anticoagulation clinic., Coumadin 4 mg tabs: use as directed by anticoagulation clinic..  The target INR is 2 - 3.  The next INR is due 12/29/2009.  Anticoagulation instructions were given to patient.  Results were reviewed/authorized by Bethena Midget, RN, BSN.  She was notified by Bethena Midget, RN, BSN.         Prior Anticoagulation Instructions: Continue taking 3 mg on Sunday, Monday, Wednesday, & Friday; then take 4 mg on Tuesday, Thursday, and Saturdays.  Current Anticoagulation Instructions: INR 2.6 Continue 3mg s daily except 4mg s on Tuesdays, Thursdays and Saturdays. Recheck in 4 weeks.

## 2010-03-10 NOTE — Medication Information (Signed)
Summary: rov/ewj  Anticoagulant Therapy  Managed by: Bethena Midget, RN, BSN Supervising MD: Eden Emms MD, Theron Arista Indication 1: Atrial Fibrillation (ICD-427.31) Lab Used: [facility] Site: [facility] INR POC 2.4 INR RANGE 2 - 3  Dietary changes: no    Health status changes: no    Bleeding/hemorrhagic complications: no    Recent/future hospitalizations: no    Any changes in medication regimen? no    Recent/future dental: no  Any missed doses?: no       Is patient compliant with meds? yes       Allergies: No Known Drug Allergies  Anticoagulation Management History:      The patient is taking warfarin and comes in today for a routine follow up visit.  Positive risk factors for bleeding include an age of 75 years or older.  The bleeding index is 'intermediate risk'.  Positive CHADS2 values include Age > 49 years old.  The start date was 03/12/1999.  Her last INR was 3.1.  Anticoagulation responsible provider: Eden Emms MD, Theron Arista.  INR POC: 2.4.  Cuvette Lot#: [id number].  Exp: 06/2010.    Anticoagulation Management Assessment/Plan:      The patient's current anticoagulation dose is Coumadin 3 mg tabs: Use as directed by anticoagulation clinic., Coumadin 4 mg tabs: use as directed by anticoagulation clinic..  The target INR is 2 - 3.  The next INR is due 06/12/2009.  Anticoagulation instructions were given to patient.  Results were reviewed/authorized by Bethena Midget, RN, BSN.  She was notified by Bethena Midget, RN, BSN.         Prior Anticoagulation Instructions: INR 2.7  Continue on same dosage 3mg  daily except 4mg  on Tuesdays, Thursdays, and Saturdays.  Recheck in 4 weeks.    Current Anticoagulation Instructions: INR 2.4 Continue 3mg s daily except 4mg s on Tuesdays, Thursdays, and Saturdays. Recheck in 4 weeks.

## 2010-03-10 NOTE — Assessment & Plan Note (Signed)
Summary: pain r side below waist 2-3 times last 3 wks/cd   Vital Signs:  Patient profile:   77 year old female Height:      66 inches Weight:      148.50 pounds BMI:     24.06 O2 Sat:      95 % on Room air Temp:     97.6 degrees F oral Pulse rate:   82 / minute Pulse rhythm:   regular Resp:     16 per minute BP sitting:   132 / 80  (left arm) Cuff size:   large  Vitals Entered By: Rock Nephew CMA (October 14, 2009 10:39 AM)  O2 Flow:  Room air  Primary Care Provider:  [name]  CC:  Abdominal pain.  History of Present Illness:  Abdominal Pain      This is an 77 year old woman who presents with Abdominal pain.  The symptoms began 6 months ago.  On a scale of 1 to 10, the intensity is described as a 3.  The patient denies nausea, vomiting, diarrhea, constipation, melena, hematochezia, anorexia, and hematemesis.  The location of the pain is right lower quadrant.  The pain is described as intermittent and sharp.  The patient denies the following symptoms: fever, weight loss, dysuria, chest pain, jaundice, dark urine, and vaginal bleeding.  The pain is worse with food.    Preventive Screening-Counseling & Management  Alcohol-Tobacco     Alcohol drinks/day: 0     Smoking Status: never     Passive Smoke Exposure: no     Tobacco Counseling: not indicated; no tobacco use  Hep-HIV-STD-Contraception     Hepatitis Risk: no risk noted     HIV Risk: no risk noted     STD Risk: no risk noted      Sexual History:  currently monogamous.        Drug Use:  no.        Blood Transfusions:  no.    Medications Prior to Update: 1)  Levoxyl 88 Mcg Tabs (Levothyroxine Sodium) .... Take 1 Tablet By Mouth Once A Day 2)  Metoprolol Tartrate 50 Mg Tabs (Metoprolol Tartrate) .... 1/2am- 1/2 Pm 3)  Coumadin 3 Mg Tabs (Warfarin Sodium) .... Use As Directed By Anticoagulation Clinic. 4)  Tylenol Arthritis Pain 650 Mg Cr-Tabs (Acetaminophen) .... Take 1 Tablet By Mouth Two Times A  Day 5)  Centrium Silver .... Take 1 Tablet By Mouth Once A Day 6)  Calcium + Vitamin D .... Take 1 Tablet By Mouth Once A Day 7)  Fiber 500mg  .... Qdtab 8)  Loratadine 10 Mg Tabs (Loratadine) .... Take 1 Tablet By Mouth Once A Day 9)  Coumadin 4 Mg Tabs (Warfarin Sodium) .... Use As Directed By Anticoagulation Clinic. 10)  Ranitidine Hcl 300 Mg Caps (Ranitidine Hcl) .... One By Mouth At Bedtime For Heartburn  Current Medications (verified): 1)  Levoxyl 88 Mcg Tabs (Levothyroxine Sodium) .... Take 1 Tablet By Mouth Once A Day 2)  Metoprolol Tartrate 50 Mg Tabs (Metoprolol Tartrate) .... 1/2am- 1/2 Pm 3)  Coumadin 3 Mg Tabs (Warfarin Sodium) .... Use As Directed By Anticoagulation Clinic. 4)  Tylenol Arthritis Pain 650 Mg Cr-Tabs (Acetaminophen) .... Take 2 Tablet By Mouth 5)  Centrium Silver .... Take 1 Tablet By Mouth Once A Day 6)  Calcium + Vitamin D .... Take 1 Tablet By Mouth Once A Day 7)  Fiber 500mg  .... 2 Tabs Daily 8)  Loratadine  10 Mg Tabs (Loratadine) .... Take 1 Tablet By Mouth Once A Day 9)  Coumadin 4 Mg Tabs (Warfarin Sodium) .... Use As Directed By Anticoagulation Clinic. 10)  Ranitidine Hcl 300 Mg Caps (Ranitidine Hcl) .... One By Mouth At Bedtime For Heartburn  Allergies (verified): No Known Drug Allergies  Past History:  Past Medical History: Last updated: 02/26/2008 Hypothyroidism Osteoporosis Renal insufficiency (BUN=27 and Cr.=1.28 on 07/21/07) Thyroid Cancer Breast cancer, hx of A.fib, pacemaker MVP Left direct inguinal hernia Atrial fibrillation  Past Surgical History: Last updated: 02/26/2008 Thyroidectomy Mastectomy Bilat. in 1982 Tonsillectomy  Family History: Last updated: 02/26/2009 Family History of Arthritis Family History Hypertension Family History Lung cancer Family History of Stroke F 1st degree relative <60 Family History of Colon CA 1st degree relative <60  Social History: Last updated: 02/26/2008 Retired Widow/Widower Never  Smoked Alcohol use-no Drug use-no Regular exercise-yes  Risk Factors: Alcohol Use: 0 (10/14/2009) Exercise: yes (02/26/2008)  Risk Factors: Smoking Status: never (10/14/2009) Passive Smoke Exposure: no (10/14/2009)  Family History: Reviewed history from 02/26/2009 and no changes required. Family History of Arthritis Family History Hypertension Family History Lung cancer Family History of Stroke F 1st degree relative <60 Family History of Colon CA 1st degree relative <60  Social History: Reviewed history from 02/26/2008 and no changes required. 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  The patient denies anorexia, fever, weight loss, weight gain, chest pain, syncope, dyspnea on exertion, peripheral edema, prolonged cough, headaches, hemoptysis, melena, hematochezia, severe indigestion/heartburn, hematuria, incontinence, suspicious skin lesions, difficulty walking, enlarged lymph nodes, angioedema, and breast masses.   Endo:  Denies cold intolerance, excessive hunger, excessive thirst, excessive urination, heat intolerance, polyuria, and weight change.  Physical Exam  General:  alert, well-developed, well-nourished, well-hydrated, appropriate dress, normal appearance, healthy-appearing, and cooperative to examination.   Head:  normocephalic, atraumatic, no abnormalities observed, and no abnormalities palpated.   Eyes:  vision grossly intact, pupils equal, pupils round, and pupils reactive to light.  no icterus. Mouth:  Oral mucosa and oropharynx without lesions or exudates.  Teeth in good repair. Neck:  supple, full ROM, no masses, no thyromegaly, no JVD, normal carotid upstroke, no carotid bruits, no cervical lymphadenopathy, and no neck tenderness.   Lungs:  normal respiratory effort, no intercostal retractions, no  accessory muscle use, normal breath sounds, no dullness, no fremitus, no crackles, and no wheezes.   Heart:  normal rate, regular rhythm, no murmur, no gallop, no rub, and no JVD.   Abdomen:  soft, non-tender, normal bowel sounds, no distention, no masses, no guarding, no rigidity, no rebound tenderness, no abdominal hernia, no inguinal hernia, no hepatomegaly, and no splenomegaly.   Msk:  normal ROM, no joint tenderness, no joint swelling, no joint warmth, no redness over joints, no joint deformities, no joint instability, and no crepitation.   Pulses:  R and L carotid,radial,femoral,dorsalis pedis and posterior tibial pulses are full and equal bilaterally Extremities:  No clubbing, cyanosis, edema, or deformity noted with normal full range of motion of all joints.   Neurologic:  No cranial nerve deficits noted. Station and gait are normal. Plantar reflexes are down-going bilaterally. DTRs are symmetrical throughout. Sensory, motor and coordinative functions appear intact. Skin:  turgor normal, color normal, no rashes, no suspicious lesions, no ecchymoses, no petechiae, no purpura, no ulcerations, and  no edema.   Cervical Nodes:  No lymphadenopathy noted Axillary Nodes:  No palpable lymphadenopathy Psych:  Cognition and judgment appear intact. Alert and cooperative with normal attention span and concentration. No apparent delusions, illusions, hallucinations   Impression & Recommendations:  Problem # 1:  ABDOMINAL PAIN, RIGHT LOWER QUADRANT (ICD-789.03) Assessment New will look for organic causes Orders: Venipuncture (16109) Gastroenterology Referral (GI) TLB-BMP (Basic Metabolic Panel-BMET) (80048-METABOL) TLB-CBC Platelet - w/Differential (85025-CBCD) TLB-Hepatic/Liver Function Pnl (80076-HEPATIC) TLB-TSH (Thyroid Stimulating Hormone) (84443-TSH) TLB-Amylase (82150-AMYL) TLB-Lipase (83690-LIPASE) TLB-Udip w/ Micro (81001-URINE)  Problem # 2:  GERD (ICD-530.81) Assessment:  Unchanged  Her updated medication list for this problem includes:    Ranitidine Hcl 300 Mg Caps (Ranitidine hcl) ..... One by mouth at bedtime for heartburn  Problem # 3:  HYPOTHYROIDISM (ICD-244.9) Assessment: Unchanged  Her updated medication list for this problem includes:    Levoxyl 88 Mcg Tabs (Levothyroxine sodium) .Marland Kitchen... Take 1 tablet by mouth once a day  Orders: Venipuncture (60454) TLB-BMP (Basic Metabolic Panel-BMET) (80048-METABOL) TLB-CBC Platelet - w/Differential (85025-CBCD) TLB-Hepatic/Liver Function Pnl (80076-HEPATIC) TLB-TSH (Thyroid Stimulating Hormone) (84443-TSH) TLB-Amylase (82150-AMYL) TLB-Lipase (83690-LIPASE) TLB-Udip w/ Micro (81001-URINE)  Labs Reviewed: TSH: 1.12 (02/26/2009)    Chol: 156 (02/26/2009)   HDL: 45.20 (02/26/2009)   LDL: 99 (02/26/2009)   TG: 60.0 (02/26/2009)  Complete Medication List: 1)  Levoxyl 88 Mcg Tabs (Levothyroxine sodium) .... Take 1 tablet by mouth once a day 2)  Metoprolol Tartrate 50 Mg Tabs (Metoprolol tartrate) .... 1/2am- 1/2 pm 3)  Coumadin 3 Mg Tabs (Warfarin sodium) .... Use as directed by anticoagulation clinic. 4)  Tylenol Arthritis Pain 650 Mg Cr-tabs (Acetaminophen) .... Take 2 tablet by mouth 5)  Centrium Silver  .... Take 1 tablet by mouth once a day 6)  Calcium + Vitamin D  .... Take 1 tablet by mouth once a day 7)  Fiber 500mg   .... 2 tabs daily 8)  Loratadine 10 Mg Tabs (Loratadine) .... Take 1 tablet by mouth once a day 9)  Coumadin 4 Mg Tabs (Warfarin sodium) .... Use as directed by anticoagulation clinic. 10)  Ranitidine Hcl 300 Mg Caps (Ranitidine hcl) .... One by mouth at bedtime for heartburn  Patient Instructions: 1)  Please schedule a follow-up appointment in 2 weeks. 2)  You need to have a Pap Smear to prevent cervical cancer. Please schedule a full physical soon.

## 2010-03-10 NOTE — Letter (Signed)
Summary: Results Follow-up Letter  [facility]  [address] [place], [place] [address]   Phone: [phone]  Fax: [phone]    03/14/2009  [id number] [place], [place]  [address]  Dear Alyssa [name],   The following are the results of your recent test(s):  Test     Result     BMD       mild osteopenia, looks good   _________________________________________________________  Please call for an appointment as directed _________________________________________________________ _________________________________________________________ _________________________________________________________  Sincerely,  Sanda Linger MD [facility]

## 2010-03-10 NOTE — Medication Information (Signed)
Summary: rov/tm   Anticoagulant Therapy  Managed by: Weston Brass, PharmD PCP: [name] Supervising MD: Excell Seltzer MD, Casimiro Needle Indication 1: Atrial Fibrillation (ICD-427.31) Lab Used: [facility]  Site: [facility] INR POC 2.9 INR RANGE 2 - 3  Dietary changes: no    Health status changes: no    Bleeding/hemorrhagic complications: no    Recent/future hospitalizations: no    Any changes in medication regimen? no    Recent/future dental: no  Any missed doses?: no       Is patient compliant with meds? yes       Allergies: No Known Drug Allergies  Anticoagulation Management History:      The patient is taking warfarin and comes in today for a routine follow up visit.  Positive risk factors for bleeding include an age of 16 years or older.  The bleeding index is 'intermediate risk'.  Positive CHADS2 values include Age > 21 years old.  The start date was 03/12/1999.  Her last INR was 3.1.  Anticoagulation responsible provider: Excell Seltzer MD, Casimiro Needle.  INR POC: 2.9.  Cuvette Lot#: [id number].  Exp: 01/2011.    Anticoagulation Management Assessment/Plan:      The patient's current anticoagulation dose is Coumadin 3 mg tabs: Use as directed by anticoagulation clinic., Coumadin 4 mg tabs: use as directed by anticoagulation clinic..  The target INR is 2 - 3.  The next INR is due 01/26/2010.  Anticoagulation instructions were given to patient.  Results were reviewed/authorized by Weston Brass, PharmD.  She was notified by Hoy Register, PharmD Candidate.         Prior Anticoagulation Instructions: INR 2.6 Continue 3mg s daily except 4mg s on Tuesdays, Thursdays and Saturdays. Recheck in 4 weeks.   Current Anticoagulation Instructions: INR 2.9 Continue previous dose of 3 mg everyday except 4 mg on Tuesday, Thursday, and Saturday Recheck INR in 4 weeks

## 2010-03-12 NOTE — Medication Information (Signed)
Summary: rov/kh   Anticoagulant Therapy  Managed by: Tammy Sours PharmD PCP: [name] Supervising MD: Tenny Craw MD, Gunnar Fusi Indication 1: Atrial Fibrillation (ICD-427.31) Lab Used: [facility]  Site: [facility] INR POC 2.1 INR RANGE 2 - 3  Dietary changes: no    Health status changes: no    Bleeding/hemorrhagic complications: no    Recent/future hospitalizations: no    Any changes in medication regimen? no    Recent/future dental: no  Any missed doses?: no       Is patient compliant with meds? yes       Allergies: No Known Drug Allergies  Anticoagulation Management History:      The patient is taking warfarin and comes in today for a routine follow up visit.  Positive risk factors for bleeding include an age of 75 years or older.  The bleeding index is 'intermediate risk'.  Positive CHADS2 values include Age > 57 years old.  The start date was 03/12/1999.  Her last INR was 3.1.  Anticoagulation responsible provider: Tenny Craw MD, Gunnar Fusi.  INR POC: 2.1.  Cuvette Lot#: [id number].  Exp: 03/2011.    Anticoagulation Management Assessment/Plan:      The patient's current anticoagulation dose is Coumadin 3 mg tabs: Use as directed by anticoagulation clinic., Coumadin 4 mg tabs: use as directed by anticoagulation clinic..  The target INR is 2 - 3.  The next INR is due 03/23/2010.  Anticoagulation instructions were given to patient.  Results were reviewed/authorized by Tammy Sours PharmD.  She was notified by Stephannie Peters, PharmD Candidate .         Prior Anticoagulation Instructions: INR 2.6  Continue current regimen of 3 mg on Sunday, Monday, Wednesday, and Friday and 4 mg on Tuesday, Thursday, and Saturday.   Recheck INR in 4 weeks.   Current Anticoagulation Instructions: INR 2.1  Continue 3 mg tablet on Sundays, Mondays, Wednesdays and Fridays and 4 mg tablets on remaining days.

## 2010-03-12 NOTE — Medication Information (Signed)
Summary: rov/kh   Anticoagulant Therapy  Managed by: Tammy Sours PharmD PCP: [name] Supervising MD: Tenny Craw MD, Gunnar Fusi Indication 1: Atrial Fibrillation (ICD-427.31) Lab Used: [facility] Site: [facility] INR POC 2.6 INR RANGE 2 - 3  Dietary changes: no    Health status changes: no    Bleeding/hemorrhagic complications: no    Recent/future hospitalizations: no    Any changes in medication regimen? no    Recent/future dental: no  Any missed doses?: no       Is patient compliant with meds? yes       Allergies: No Known Drug Allergies  Anticoagulation Management History:      The patient is taking warfarin and comes in today for a routine follow up visit.  Positive risk factors for bleeding include an age of 15 years or older.  The bleeding index is 'intermediate risk'.  Positive CHADS2 values include Age > 22 years old.  The start date was 03/12/1999.  Her last INR was 3.1.  Anticoagulation responsible Tierney Behl: Tenny Craw MD, Gunnar Fusi.  INR POC: 2.6.  Cuvette Lot#: [id number].  Exp: 01/2011.    Anticoagulation Management Assessment/Plan:      The patient's current anticoagulation dose is Coumadin 3 mg tabs: Use as directed by anticoagulation clinic., Coumadin 4 mg tabs: use as directed by anticoagulation clinic..  The target INR is 2 - 3.  The next INR is due 02/23/2010.  Anticoagulation instructions were given to patient.  Results were reviewed/authorized by Tammy Sours PharmD.         Prior Anticoagulation Instructions: INR 2.9 Continue previous dose of 3 mg everyday except 4 mg on Tuesday, Thursday, and Saturday Recheck INR in 4 weeks  Current Anticoagulation Instructions: INR 2.6  Continue current regimen of 3 mg on Sunday, Monday, Wednesday, and Friday and 4 mg on Tuesday, Thursday, and Saturday.   Recheck INR in 4 weeks.

## 2010-03-23 ENCOUNTER — Encounter (INDEPENDENT_AMBULATORY_CARE_PROVIDER_SITE_OTHER): Payer: Medicare Other

## 2010-03-23 ENCOUNTER — Encounter: Payer: Self-pay | Admitting: Cardiology

## 2010-03-23 ENCOUNTER — Encounter: Payer: Self-pay | Admitting: Internal Medicine

## 2010-03-23 DIAGNOSIS — R0989 Other specified symptoms and signs involving the circulatory and respiratory systems: Secondary | ICD-10-CM

## 2010-03-23 DIAGNOSIS — I495 Sick sinus syndrome: Secondary | ICD-10-CM

## 2010-03-23 DIAGNOSIS — I4891 Unspecified atrial fibrillation: Secondary | ICD-10-CM

## 2010-03-23 DIAGNOSIS — Z7901 Long term (current) use of anticoagulants: Secondary | ICD-10-CM

## 2010-03-24 ENCOUNTER — Encounter: Payer: Self-pay | Admitting: *Deleted

## 2010-03-24 DIAGNOSIS — I4891 Unspecified atrial fibrillation: Secondary | ICD-10-CM

## 2010-04-01 NOTE — Procedures (Signed)
Summary: past due pacemaker ck/mt  mca      Allergies Added: NKDA  Current Medications (verified): 1)  Levoxyl 88 Mcg Tabs (Levothyroxine Sodium) .... Take 1 Tablet By Mouth Once A Day 2)  Metoprolol Tartrate 50 Mg Tabs (Metoprolol Tartrate) .... 1/2am- 1/2 Pm 3)  Coumadin 3 Mg Tabs (Warfarin Sodium) .... Use As Directed By Anticoagulation Clinic. 4)  Tylenol Arthritis Pain 650 Mg Cr-Tabs (Acetaminophen) .... Take 2 Tablet By Mouth Two Times A Day 5)  Centrium Silver .... Take 1 Tablet By Mouth Once A Day 6)  Calcium + Vitamin D .... Take 1 Tablet By Mouth Once A Day 7)  Fiber 500mg  .... 2 Tabs Daily 8)  Loratadine 10 Mg Tabs (Loratadine) .... Take 1 Tablet By Mouth Once A Day 9)  Coumadin 4 Mg Tabs (Warfarin Sodium) .... Use As Directed By Anticoagulation Clinic. 10)  Ranitidine Hcl 300 Mg Caps (Ranitidine Hcl) .... One By Mouth At Bedtime For Heartburn 11)  Align   Caps (Misc Intestinal Flora Regulat) .... Take One Capsule By Mouth Daily  Allergies (verified): No Known Drug Allergies  PPM Specifications Following MD:  Sherryl Manges, MD     PPM Vendor:  [employer]     PPM Model Number:  [id number]     PPM Serial Number:  [id number] PPM DOI:  12/10/2005     PPM Implanting MD:  Sherryl Manges, MD  Lead 1    Location: [facility]     DOI: 10/30/1998     Model #: [id number]     Serial #: [id number]     Status: active Lead 2    Location: [facility]     DOI: 10/30/1998     Model #: [id number]     Serial #: [id number]     Status: active  Magnet Response Rate:  BOL 85 ERI  65  Indications:  Pacemaker dependent   PPM Follow Up Pacer Dependent:  Yes      Episodes Coumadin:  Yes  Parameters Mode:  VVIR     Lower Rate Limit:  60     Upper Rate Limit:  130

## 2010-04-01 NOTE — Medication Information (Signed)
Summary: [facility]   Anticoagulant Therapy  Managed by: Georgina Pillion, PharmD PCP: [name] Supervising MD: Jens Som MD, Arlys John Indication 1: Atrial Fibrillation (ICD-427.31) Lab Used: [facility] [place] Site: [facility] INR POC 2.3 INR RANGE 2 - 3  Dietary changes: no    Health status changes: no    Bleeding/hemorrhagic complications: no    Recent/future hospitalizations: no    Any changes in medication regimen? no    Recent/future dental: no  Any missed doses?: no       Is patient compliant with meds? yes       Allergies: No Known Drug Allergies  Anticoagulation Management History:      Positive risk factors for bleeding include an age of 75 years or older.  The bleeding index is 'intermediate risk'.  Positive CHADS2 values include Age > 18 years old.  The start date was 03/12/1999.  Her last INR was 3.1.  Anticoagulation responsible provider: Jens Som MD, Arlys John.  INR POC: 2.3.  Cuvette Lot#: [id number].  Exp: 02/2011.    Anticoagulation Management Assessment/Plan:      The patient's current anticoagulation dose is Coumadin 3 mg tabs: Use as directed by anticoagulation clinic., Coumadin 4 mg tabs: use as directed by anticoagulation clinic..  The target INR is 2 - 3.  The next INR is due 04/20/2010.  Anticoagulation instructions were given to patient.  Results were reviewed/authorized by Georgina Pillion, PharmD.  She was notified by Georgina Pillion PharmD.         Prior Anticoagulation Instructions: INR 2.1  Continue 3 mg tablet on Sundays, Mondays, Wednesdays and Fridays and 4 mg tablets on remaining days.   Current Anticoagulation Instructions: Continue current regimen of 3 mg daily except for 4 mg on Tuesdays, Thursdays, and Saturdays.   INR 2.3

## 2010-04-07 NOTE — Cardiovascular Report (Signed)
Summary: Office Visit   Office Visit   Imported By: Roderic Ovens 04/03/2010 09:11:21  _____________________________________________________________________  External Attachment:    Type:   Image     Comment:   External Document

## 2010-04-20 ENCOUNTER — Encounter (INDEPENDENT_AMBULATORY_CARE_PROVIDER_SITE_OTHER): Payer: Medicare Other

## 2010-04-20 ENCOUNTER — Encounter: Payer: Self-pay | Admitting: Cardiology

## 2010-04-20 DIAGNOSIS — I4891 Unspecified atrial fibrillation: Secondary | ICD-10-CM

## 2010-04-20 DIAGNOSIS — Z7901 Long term (current) use of anticoagulants: Secondary | ICD-10-CM

## 2010-04-23 ENCOUNTER — Telehealth: Payer: Self-pay | Admitting: Internal Medicine

## 2010-04-23 ENCOUNTER — Other Ambulatory Visit: Payer: Self-pay | Admitting: Internal Medicine

## 2010-04-23 ENCOUNTER — Other Ambulatory Visit: Payer: Medicare Other

## 2010-04-23 ENCOUNTER — Encounter (INDEPENDENT_AMBULATORY_CARE_PROVIDER_SITE_OTHER): Payer: Medicare Other | Admitting: Internal Medicine

## 2010-04-23 ENCOUNTER — Encounter: Payer: Self-pay | Admitting: Internal Medicine

## 2010-04-23 DIAGNOSIS — Z800 Family history of malignant neoplasm of digestive organs: Secondary | ICD-10-CM

## 2010-04-23 DIAGNOSIS — Z23 Encounter for immunization: Secondary | ICD-10-CM

## 2010-04-23 DIAGNOSIS — E039 Hypothyroidism, unspecified: Secondary | ICD-10-CM

## 2010-04-23 DIAGNOSIS — N259 Disorder resulting from impaired renal tubular function, unspecified: Secondary | ICD-10-CM

## 2010-04-23 DIAGNOSIS — E875 Hyperkalemia: Secondary | ICD-10-CM

## 2010-04-23 LAB — BASIC METABOLIC PANEL
CO2: 30 mEq/L (ref 19–32)
Calcium: 9.1 mg/dL (ref 8.4–10.5)
Glucose, Bld: 65 mg/dL — ABNORMAL LOW (ref 70–99)
Potassium: 5.7 mEq/L — ABNORMAL HIGH (ref 3.5–5.1)
Sodium: 138 mEq/L (ref 135–145)

## 2010-04-28 NOTE — Progress Notes (Signed)
  Phone Note Outgoing Call   Summary of Call: i notified pt that her K+ = 5.7 and that I have referred her to see a kidney doctor Initial call taken by: [name],  April 23, 2010 12:29 PM

## 2010-04-28 NOTE — Assessment & Plan Note (Signed)
Summary: YEARLY----STC   Vital Signs:  Patient profile:   77 year old female Menstrual status:  postmenopausal Height:      66 inches Weight:      146 pounds BMI:     23.65 O2 Sat:      96 % on Room air Temp:     97.4 degrees F oral Pulse rate:   78 / minute Pulse rhythm:   regular Resp:     16 per minute BP sitting:   120 / 74  (left arm) Cuff size:   regular  Vitals Entered By: Rock Nephew CMA (April 23, 2010 9:15 AM)  O2 Flow:  Room air CC: Pt here for f/up,  Is Patient Diabetic? No Pain Assessment Patient in pain? no       Does patient need assistance? Functional Status Self care Ambulation Normal     Menstrual Status postmenopausal   Primary Care Provider:  [name]  CC:  Pt here for f/up and .  History of Present Illness:  Follow-Up Visit      This is an 77 year old woman who presents for Follow-up visit.  The patient denies chest pain, palpitations, dizziness, syncope, edema, SOB, DOE, PND, and orthopnea.  Since the last visit the patient notes no new problems or concerns.  The patient reports taking meds as prescribed.  When questioned about possible medication side effects, the patient notes none.    She requests a GI referral regarding a Fam Hx of colon cancer, she tells me that her last colonoscopy was aout 5 years ago and that she has never had a polyp discovered.  Dyspepsia History:      She has no alarm features of dyspepsia including no history of melena, hematochezia, dysphagia, persistent vomiting, or involuntary weight loss > 5%.  There is a prior history of GERD.  The patient does not have a prior history of documented ulcer disease.  An H-2 blocker medication is currently being taken.  A prior EGD has been done.     Preventive Screening-Counseling & Management  Alcohol-Tobacco     Alcohol drinks/day: 0     Alcohol Counseling: not indicated; patient does not drink     Smoking Status: never     Passive Smoke Exposure: no  Tobacco Counseling: not indicated; no tobacco use  Hep-HIV-STD-Contraception     Hepatitis Risk: no risk noted     HIV Risk: no risk noted     STD Risk: no risk noted      Sexual History:  currently monogamous.        Drug Use:  no.        Blood Transfusions:  no.    Clinical Review Panels:  Prevention   Last Colonoscopy:  FINDINGS:  Internal hemorrhoids, significant diverticulosis in the sigmoid colon, otherwise an unremarkable exam. (10/28/2005)  Immunizations   Last Tetanus Booster:  Tdap (04/23/2010)   Last Flu Vaccine:  Fluvirin (12/01/2009)   Last Pneumovax:  Pneumovax (04/23/2010)  Lipid Management   Cholesterol:  156 (02/26/2009)   LDL (bad choesterol):  99 (02/26/2009)   HDL (good cholesterol):  45.20 (02/26/2009)  Diabetes Management   Creatinine:  1.3 (10/15/2009)   Last Flu Vaccine:  Fluvirin (12/01/2009)   Last Pneumovax:  Pneumovax (04/23/2010)  CBC   WBC:  6.7 (10/14/2009)   RBC:  4.22 (10/14/2009)   Hgb:  13.3 (10/14/2009)   Hct:  39.2 (10/14/2009)   Platelets:  198.0 (10/14/2009)   MCV  92.8 (10/14/2009)   MCHC  34.1 (10/14/2009)   RDW  12.8 (10/14/2009)   PMN:  63.0 (10/14/2009)   Lymphs:  19.0 (10/14/2009)   Monos:  8.6 (10/14/2009)   Eosinophils:  8.3 (10/14/2009)   Basophil:  1.1 (10/14/2009)  Complete Metabolic Panel   Glucose:  78 (10/15/2009)   Sodium:  139 (10/15/2009)   Potassium:  4.9 (10/15/2009)   Chloride:  100 (10/15/2009)   CO2:  31 (10/15/2009)   BUN:  28 (10/15/2009)   Creatinine:  1.3 (10/15/2009)   Albumin:  4.2 (10/14/2009)   Total Protein:  6.5 (10/14/2009)   Calcium:  8.6 (10/15/2009)   Total Bili:  0.7 (10/14/2009)   Alk Phos:  58 (10/14/2009)   SGPT (ALT):  18 (10/14/2009)   SGOT (AST):  24 (10/14/2009)   Medications Prior to Update: 1)  Levoxyl 88 Mcg Tabs (Levothyroxine Sodium) .... Take 1 Tablet By Mouth Once A Day 2)  Metoprolol Tartrate 50 Mg Tabs (Metoprolol Tartrate) .... 1/2am- 1/2 Pm 3)  Coumadin 3  Mg Tabs (Warfarin Sodium) .... Use As Directed By Anticoagulation Clinic. 4)  Tylenol Arthritis Pain 650 Mg Cr-Tabs (Acetaminophen) .... Take 2 Tablet By Mouth Two Times A Day 5)  Centrium Silver .... Take 1 Tablet By Mouth Once A Day 6)  Calcium + Vitamin D .... Take 1 Tablet By Mouth Once A Day 7)  Fiber 500mg  .... 2 Tabs Daily 8)  Loratadine 10 Mg Tabs (Loratadine) .... Take 1 Tablet By Mouth Once A Day 9)  Coumadin 4 Mg Tabs (Warfarin Sodium) .... Use As Directed By Anticoagulation Clinic. 10)  Ranitidine Hcl 300 Mg Caps (Ranitidine Hcl) .... One By Mouth At Bedtime For Heartburn 11)  Align   Caps (Misc Intestinal Flora Regulat) .... Take One Capsule By Mouth Daily  Current Medications (verified): 1)  Levoxyl 88 Mcg Tabs (Levothyroxine Sodium) .... Take 1 Tablet By Mouth Once A Day 2)  Metoprolol Tartrate 50 Mg Tabs (Metoprolol Tartrate) .... 1/2am- 1/2 Pm 3)  Coumadin 3 Mg Tabs (Warfarin Sodium) .... Use As Directed By Anticoagulation Clinic. 4)  Tylenol Arthritis Pain 650 Mg Cr-Tabs (Acetaminophen) .... Take 2 Tablet By Mouth Two Times A Day 5)  Centrium Silver .... Take 1 Tablet By Mouth Once A Day 6)  Calcium + Vitamin D .... Take 1 Tablet By Mouth Once A Day 7)  Loratadine 10 Mg Tabs (Loratadine) .... Take 1 Tablet By Mouth Once A Day 8)  Coumadin 4 Mg Tabs (Warfarin Sodium) .... Use As Directed By Anticoagulation Clinic. 9)  Ranitidine Hcl 300 Mg Caps (Ranitidine Hcl) .... One By Mouth At Bedtime For Heartburn 10)  Align   Caps (Misc Intestinal Flora Regulat) .... Take One Capsule By Mouth Daily  Allergies (verified): No Known Drug Allergies  Past History:  Past Medical History: Last updated: 12/01/2009 Hypothyroidism Osteoporosis Renal insufficiency (BUN=27 and Cr.=1.28 on 07/21/07) Thyroid Cancer Breast cancer, hx of A.fib, pacemaker MVP Left direct inguinal hernia Atrial fibrillation IBS  Past Surgical History: Last updated: 12/01/2009 Thyroidectomy Mastectomy  Bilat. in 1982 Tonsillectomy Pacemaker   Family History: Last updated: 12/01/2009 Family History of Arthritis Family History Hypertension Family History of Stroke F 1st degree relative: mother Family History of Colon CA 1st degree relative: father dxed about age 52  Social History: Last updated: 12/01/2009 Retired Widow/Widower Never Smoked Alcohol use-no Drug use-no Regular exercise-yes Daily Caffeine Use sometimes  Risk Factors: Alcohol Use: 0 (04/23/2010) Exercise: yes (02/26/2008)  Risk Factors: Smoking Status: never (04/23/2010)  Passive Smoke Exposure: no (04/23/2010)  Family History: Reviewed history from 12/01/2009 and no changes required. Family History of Arthritis Family History Hypertension Family History of Stroke F 1st degree relative: mother Family History of Colon CA 1st degree relative: father dxed about age 33  Social History: Reviewed history from 12/01/2009 and no changes required. Retired Conservation officer, nature Never Smoked Alcohol use-no Drug use-no Regular exercise-yes Daily Caffeine Use sometimes  Review of Systems  The patient denies anorexia, fever, weight loss, weight gain, chest pain, syncope, dyspnea on exertion, peripheral edema, prolonged cough, headaches, hemoptysis, abdominal pain, melena, hematochezia, severe indigestion/heartburn, muscle weakness, suspicious skin lesions, depression, unusual weight change, abnormal bleeding, and enlarged lymph nodes.   Endo:  Denies cold intolerance, excessive hunger, excessive thirst, excessive urination, heat intolerance, polyuria, and weight change.  Physical Exam  General:  alert, well-developed, well-nourished, well-hydrated, appropriate dress, normal appearance, healthy-appearing, and cooperative to examination.   Head:  normocephalic, atraumatic, no abnormalities observed, and no abnormalities palpated.   Mouth:  Oral mucosa and oropharynx without lesions or exudates.  Teeth in good repair. Neck:   supple, full ROM, no masses, no thyromegaly, no JVD, normal carotid upstroke, no carotid bruits, no cervical lymphadenopathy, and no neck tenderness.   Lungs:  normal respiratory effort, no intercostal retractions, no accessory muscle use, normal breath sounds, no dullness, no fremitus, no crackles, and no wheezes.   Heart:  normal rate, regular rhythm, no murmur, no gallop, no rub, and no JVD.   Abdomen:  soft, non-tender, normal bowel sounds, no distention, no masses, no guarding, no rigidity, no rebound tenderness, no abdominal hernia, no inguinal hernia, no hepatomegaly, and no splenomegaly.   Msk:  normal ROM, no joint tenderness, no joint swelling, no joint warmth, no redness over joints, no joint deformities, no joint instability, and no crepitation.   Pulses:  R and L carotid,radial,femoral,dorsalis pedis and posterior tibial pulses are full and equal bilaterally Extremities:  No clubbing, cyanosis, edema, or deformity noted with normal full range of motion of all joints.   Neurologic:  No cranial nerve deficits noted. Station and gait are normal. Plantar reflexes are down-going bilaterally. DTRs are symmetrical throughout. Sensory, motor and coordinative functions appear intact. Skin:  turgor normal, color normal, no rashes, no suspicious lesions, no ecchymoses, no petechiae, no purpura, no ulcerations, and no edema.   Cervical Nodes:  No lymphadenopathy noted Psych:  Cognition and judgment appear intact. Alert and cooperative with normal attention span and concentration. No apparent delusions, illusions, hallucinations   Impression & Recommendations:  Problem # 1:  HYPERKALEMIA (ICD-276.7) Assessment Unchanged  Orders: TLB-TSH (Thyroid Stimulating Hormone) (84443-TSH) TLB-BMP (Basic Metabolic Panel-BMET) (80048-METABOL)  Problem # 2:  RENAL INSUFFICIENCY (ICD-588.9) Assessment: Unchanged  Orders: TLB-TSH (Thyroid Stimulating Hormone) (84443-TSH) TLB-BMP (Basic Metabolic  Panel-BMET) (80048-METABOL)  Problem # 3:  HYPOTHYROIDISM (ICD-244.9) Assessment: Unchanged  Her updated medication list for this problem includes:    Levoxyl 88 Mcg Tabs (Levothyroxine sodium) .Marland Kitchen... Take 1 tablet by mouth once a day  Orders: TLB-TSH (Thyroid Stimulating Hormone) (84443-TSH) TLB-BMP (Basic Metabolic Panel-BMET) (80048-METABOL)  Labs Reviewed: TSH: 1.56 (10/14/2009)    Chol: 156 (02/26/2009)   HDL: 45.20 (02/26/2009)   LDL: 99 (02/26/2009)   TG: 60.0 (02/26/2009)  Problem # 4:  FAMILY HISTORY OF COLON CA 1ST DEGREE RELATIVE <60 (ICD-V16.0) Assessment: Unchanged  Orders: Gastroenterology Referral (GI)  Complete Medication List: 1)  Levoxyl 88 Mcg Tabs (Levothyroxine sodium) .... Take 1 tablet by mouth once a day 2)  Metoprolol Tartrate  50 Mg Tabs (Metoprolol tartrate) .... 1/2am- 1/2 pm 3)  Coumadin 3 Mg Tabs (Warfarin sodium) .... Use as directed by anticoagulation clinic. 4)  Tylenol Arthritis Pain 650 Mg Cr-tabs (Acetaminophen) .... Take 2 tablet by mouth two times a day 5)  Centrium Silver  .... Take 1 tablet by mouth once a day 6)  Calcium + Vitamin D  .... Take 1 tablet by mouth once a day 7)  Loratadine 10 Mg Tabs (Loratadine) .... Take 1 tablet by mouth once a day 8)  Coumadin 4 Mg Tabs (Warfarin sodium) .... Use as directed by anticoagulation clinic. 9)  Ranitidine Hcl 300 Mg Caps (Ranitidine hcl) .... One by mouth at bedtime for heartburn 10)  Align Caps (Misc intestinal flora regulat) .... Take one capsule by mouth daily  Other Orders: Tdap => 16yrs IM (16109) Pneumococcal Vaccine (60454) Admin 1st Vaccine (09811) Admin of Any Addtl Vaccine (91478)  Patient Instructions: 1)  Please schedule a follow-up appointment in 6 months.   Orders Added: 1)  TLB-TSH (Thyroid Stimulating Hormone) [84443-TSH] 2)  TLB-BMP (Basic Metabolic Panel-BMET) [80048-METABOL] 3)  Gastroenterology Referral [GI] 4)  Tdap => 32yrs IM [90715] 5)  Pneumococcal Vaccine  [90732] 6)  Admin 1st Vaccine [90471] 7)  Admin of Any Addtl Vaccine [90472] 8)  Est. Patient Level III [29562]   Immunizations Administered:  Tetanus Vaccine:    Vaccine Type: Tdap    Site: left deltoid    Mfr: GlaxoSmithKline    Dose: 0.5 ml    Route: IM    Given by: Rock Nephew CMA    Exp. Date: 01/02/2012    Lot #: [id number]    VIS given: 12/27/07 version given April 23, 2010.  Pneumonia Vaccine:    Vaccine Type: Pneumovax    Site: right deltoid    Mfr: Merck    Dose: 0.5 ml    Route: IM    Given by: Rock Nephew CMA    Exp. Date: 09/04/2011    Lot #: [id number]    VIS given: 01/13/09 version given April 23, 2010.   Immunizations Administered:  Tetanus Vaccine:    Vaccine Type: Tdap    Site: left deltoid    Mfr: GlaxoSmithKline    Dose: 0.5 ml    Route: IM    Given by: Rock Nephew CMA    Exp. Date: 01/02/2012    Lot #: [id number]    VIS given: 12/27/07 version given April 23, 2010.  Pneumonia Vaccine:    Vaccine Type: Pneumovax    Site: right deltoid    Mfr: Merck    Dose: 0.5 ml    Route: IM    Given by: Rock Nephew CMA    Exp. Date: 09/04/2011    Lot #: [id number]    VIS given: 01/13/09 version given April 23, 2010.  Preventive Care Screening  Bone Density:    Date:  02/08/2009    Results:  done std dev

## 2010-04-28 NOTE — Medication Information (Signed)
Summary: rov/ewj   Anticoagulant Therapy  Managed by: Windell Hummingbird, RN PCP: [name] Supervising MD: Riley Kill MD, Maisie Fus Indication 1: Atrial Fibrillation (ICD-427.31) Lab Used: [facility] Site: [facility] INR POC 2.0 INR RANGE 2 - 3  Dietary changes: no    Health status changes: no    Bleeding/hemorrhagic complications: no    Recent/future hospitalizations: no    Any changes in medication regimen? no    Recent/future dental: no  Any missed doses?: no       Is patient compliant with meds? yes       Allergies: No Known Drug Allergies  Anticoagulation Management History:      The patient is taking warfarin and comes in today for a routine follow up visit.  Positive risk factors for bleeding include an age of 75 years or older.  The bleeding index is 'intermediate risk'.  Positive CHADS2 values include Age > 98 years old.  The start date was 03/12/1999.  Her last INR was 3.1.  Anticoagulation responsible provider: Riley Kill MD, Maisie Fus.  INR POC: 2.0.  Cuvette Lot#: [id number].  Exp: 04/2011.    Anticoagulation Management Assessment/Plan:      The patient's current anticoagulation dose is Coumadin 3 mg tabs: Use as directed by anticoagulation clinic., Coumadin 4 mg tabs: use as directed by anticoagulation clinic..  The target INR is 2 - 3.  The next INR is due 05/18/2010.  Anticoagulation instructions were given to patient.  Results were reviewed/authorized by Windell Hummingbird, RN.  She was notified by Windell Hummingbird, RN.         Prior Anticoagulation Instructions: Continue current regimen of 3 mg daily except for 4 mg on Tuesdays, Thursdays, and Saturdays.   INR 2.3  Current Anticoagulation Instructions: INR 2.0 Continue taking 3 mg every day, except take 4 mg on Tuesdays, Thursdays, and Saturdays. Recheck in 4 weeks.

## 2010-05-06 ENCOUNTER — Telehealth: Payer: Self-pay | Admitting: *Deleted

## 2010-05-06 NOTE — Telephone Encounter (Signed)
Pt c/o "sinus infection". She has had chest congestion, wheezing, cough w/yellow mucus. Has used robitussin and steam w/little relief. She did have fever a couple days ago but none now. Feels she needs an abx. No open apts, do you want to wk pt in? Does she need to go to UC? Or would you call in rx?

## 2010-05-07 MED ORDER — CEFUROXIME AXETIL 500 MG PO TABS: 500.0000 mg | ORAL_TABLET | Freq: Two times a day (BID) | ORAL | Status: AC

## 2010-05-07 MED ORDER — CEFUROXIME AXETIL 500 MG PO TABS: 500.0000 mg | ORAL_TABLET | Freq: Two times a day (BID) | ORAL | Status: DC

## 2010-05-07 NOTE — Telephone Encounter (Signed)
ceftin rx written, it needs to be called in

## 2010-05-07 NOTE — Telephone Encounter (Signed)
Rx called in to [facility], pt notified

## 2010-05-18 ENCOUNTER — Ambulatory Visit (INDEPENDENT_AMBULATORY_CARE_PROVIDER_SITE_OTHER): Payer: Medicare Other | Admitting: *Deleted

## 2010-05-18 DIAGNOSIS — I4891 Unspecified atrial fibrillation: Secondary | ICD-10-CM

## 2010-05-18 DIAGNOSIS — Z7901 Long term (current) use of anticoagulants: Secondary | ICD-10-CM

## 2010-05-18 NOTE — Patient Instructions (Signed)
INR 2.0 Continue taking 3mg  tablets daily, except take 4mg  tablets on Tuesdays, Thursdays, and Saturdays. Recheck in 4 weeks.

## 2010-05-19 ENCOUNTER — Telehealth: Payer: Self-pay | Admitting: *Deleted

## 2010-05-19 NOTE — Telephone Encounter (Signed)
ok 

## 2010-05-19 NOTE — Telephone Encounter (Signed)
Pt completed abx and still c/o wheezing. She wants to know if she needs ov? If yes, ok to work in?

## 2010-05-19 NOTE — Telephone Encounter (Signed)
Scheduled for ov tomorrow at 11;15

## 2010-05-20 ENCOUNTER — Encounter: Payer: Self-pay | Admitting: Internal Medicine

## 2010-05-20 ENCOUNTER — Ambulatory Visit (INDEPENDENT_AMBULATORY_CARE_PROVIDER_SITE_OTHER): Payer: Medicare Other | Admitting: Internal Medicine

## 2010-05-20 VITALS — BP 102/62 | HR 85 | Temp 97.8°F | Resp 14 | Ht 66.0 in | Wt 144.5 lb

## 2010-05-20 DIAGNOSIS — J309 Allergic rhinitis, unspecified: Secondary | ICD-10-CM | POA: Insufficient documentation

## 2010-05-20 DIAGNOSIS — J209 Acute bronchitis, unspecified: Secondary | ICD-10-CM | POA: Insufficient documentation

## 2010-05-20 DIAGNOSIS — J3089 Other allergic rhinitis: Secondary | ICD-10-CM

## 2010-05-20 DIAGNOSIS — E039 Hypothyroidism, unspecified: Secondary | ICD-10-CM

## 2010-05-20 MED ORDER — MOXIFLOXACIN HCL 400 MG PO TABS: 400.0000 mg | ORAL_TABLET | Freq: Every day | ORAL | Status: AC

## 2010-05-20 MED ORDER — HYDROCOD POLST-CPM POLST ER 10-8 MG PO CP12: 1.0000 | ORAL_CAPSULE | Freq: Two times a day (BID) | ORAL | Status: DC | PRN

## 2010-05-20 NOTE — Assessment & Plan Note (Signed)
Gave her a dose of depo-medrol IM to reduce the inflammation and symptoms

## 2010-05-20 NOTE — Assessment & Plan Note (Signed)
Start avelox for infection and tussicaps for the cough

## 2010-05-20 NOTE — Assessment & Plan Note (Signed)
Her most recent tsh is normal so I will continue the same for now

## 2010-05-20 NOTE — Progress Notes (Signed)
Subjective:    Patient ID: Alyssa [name], female    DOB: 22-Feb-1924, 77 y.o.   MRN: [id number]  Cough This is a new problem. The current episode started 1 to 4 weeks ago. The problem has been gradually worsening. The problem occurs every few hours. The cough is productive of purulent sputum. Associated symptoms include nasal congestion, postnasal drip, rhinorrhea and wheezing. Pertinent negatives include no chest pain, chills, ear congestion, ear pain, fever, headaches, heartburn, hemoptysis, myalgias, rash, sore throat, shortness of breath, sweats or weight loss. The symptoms are aggravated by cold air and pollens. Risk factors for lung disease include travel. She has tried OTC cough suppressant and rest for the symptoms. The treatment provided moderate relief. There is no history of asthma, bronchiectasis, bronchitis, COPD, emphysema, environmental allergies or pneumonia.  Sinusitis Associated symptoms include congestion, coughing, sinus pressure and sneezing. Pertinent negatives include no chills, diaphoresis, ear pain, headaches, neck pain, shortness of breath or sore throat.  Wheezing  Associated symptoms include coughing and rhinorrhea. Pertinent negatives include no abdominal pain, chest pain, chills, diarrhea, ear pain, fever, headaches, hemoptysis, neck pain, rash, shortness of breath, sore throat or vomiting. There is no history of asthma, COPD or pneumonia.      Review of Systems  Constitutional: Negative for fever, chills, weight loss, diaphoresis, activity change, appetite change, fatigue and unexpected weight change.  HENT: Positive for congestion, rhinorrhea, sneezing, postnasal drip and sinus pressure. Negative for ear pain, nosebleeds, sore throat, facial swelling, trouble swallowing, neck pain, voice change, tinnitus and ear discharge.   Respiratory: Positive for cough and wheezing. Negative for apnea, hemoptysis, choking, chest tightness, shortness of breath and stridor.     Cardiovascular: Negative for chest pain, palpitations and leg swelling.  Gastrointestinal: Negative for heartburn, nausea, vomiting, abdominal pain, diarrhea, constipation, blood in stool, abdominal distention and anal bleeding.  Genitourinary: Negative for dysuria, urgency, frequency, hematuria, flank pain, decreased urine volume, enuresis, difficulty urinating and dyspareunia.  Musculoskeletal: Negative for myalgias, back pain, joint swelling, arthralgias and gait problem.  Skin: Negative for rash.  Neurological: Negative for dizziness, tremors, seizures, syncope, facial asymmetry, speech difficulty, weakness, light-headedness, numbness and headaches.  Hematological: Negative for environmental allergies and adenopathy. Does not bruise/bleed easily.  Psychiatric/Behavioral: Negative for behavioral problems, confusion, self-injury, dysphoric mood and agitation. The patient is not nervous/anxious.    Lab Results  Component Value Date   WBC 6.7 10/14/2009   HGB 13.3 10/14/2009   HGB TRACE-LYSED 10/14/2009   HCT 39.2 10/14/2009   PLT 198.0 10/14/2009   CHOL 156 02/26/2009   TRIG 60.0 02/26/2009   HDL 45.20 02/26/2009   ALT 18 10/14/2009   AST 24 10/14/2009   NA 138 04/23/2010   K 5.7* 04/23/2010   CL 102 04/23/2010   CREATININE 1.3* 04/23/2010   BUN 24* 04/23/2010   CO2 30 04/23/2010   TSH 0.89 04/23/2010   INR 2.0 05/18/2010      Objective:   Physical Exam  Constitutional: She is oriented to person, place, and time. She appears well-developed and well-nourished. No distress.  HENT:  Head: Normocephalic and atraumatic.  Right Ear: External ear normal.  Left Ear: External ear normal.  Nose: Nose normal.  Mouth/Throat: Oropharynx is clear and moist. No oropharyngeal exudate.  Eyes: Conjunctivae and EOM are normal. Pupils are equal, round, and reactive to light. Right eye exhibits no discharge. Left eye exhibits no discharge. No scleral icterus.  Neck: Neck supple. No JVD present. No thyromegaly present.   Cardiovascular:  Normal rate, regular rhythm, normal heart sounds and intact distal pulses.  Exam reveals no gallop and no friction rub.   No murmur heard. Pulmonary/Chest: Effort normal and breath sounds normal. No respiratory distress. She has no wheezes. She has no rales. She exhibits no tenderness.  Abdominal: Soft. Bowel sounds are normal. She exhibits no distension and no mass. There is no tenderness. There is no rebound and no guarding.  Musculoskeletal: Normal range of motion. She exhibits no edema and no tenderness.  Lymphadenopathy:    She has no cervical adenopathy.  Neurological: She is alert and oriented to person, place, and time. She has normal reflexes.  Skin: Skin is warm and dry. No rash noted. She is not diaphoretic. No erythema. No pallor.  Psychiatric: She has a normal mood and affect. Her behavior is normal. Judgment and thought content normal.          Assessment & Plan:

## 2010-05-20 NOTE — Patient Instructions (Signed)
Allergic Rhinitis Allergic rhinitis is when the mucous membranes in the nose respond to allergens. Allergens are particles in the air that cause your body to have an allergic reaction. This causes you to release allergic antibodies. Through a chain of events, these eventually cause you to release histamine into the blood stream (hence the use of antihistamines). Although meant to be protective to the body, it is this release that causes your discomfort, such as frequent sneezing, congestion and an itchy runny nose.  CAUSES The pollen allergens may come from grasses, trees, and weeds. This is seasonal allergic rhinitis, or "hay fever." Other allergens cause year-round allergic rhinitis (perennial allergic rhinitis) such as house dust mite allergen, pet dander and mold spores.  SYMPTOMS  Nasal stuffiness (congestion).   Runny, itchy nose with sneezing and tearing of the eyes.   There is often an itching of the mouth, eyes and ears.  It cannot be cured, but it can be controlled with medications. DIAGNOSIS If you are unable to determine the offending allergen, skin or blood testing may find it. TREATMENT  Avoid the allergen.   Medications and allergy shots (immunotherapy) can help.   Hay fever may often be treated with antihistamines in pill or nasal spray forms. Antihistamines block the effects of histamine. There are over-the-counter medicines that may help with nasal congestion and swelling around the eyes. Check with your caregiver before taking or giving this medicine.  If the treatment above does not work, there are many new medications your caregiver can prescribe. Stronger medications may be used if initial measures are ineffective. Desensitizing injections can be used if medications and avoidance fails. Desensitization is when a patient is given ongoing shots until the body becomes less sensitive to the allergen. Make sure you follow up with your caregiver if problems continue. SEEK  MEDICAL CARE IF:   You develop fever (more than 100.5F (38.1 C).   You develop a cough that does not stop easily (persistent).   You have shortness of breath.   You start wheezing.   Symptoms interfere with normal daily activities.  Document Released: 10/20/2000 Document Re-Released: 02/16/2009 Exit[employer] Patient Information 2011 [employer]. 

## 2010-06-15 ENCOUNTER — Ambulatory Visit (INDEPENDENT_AMBULATORY_CARE_PROVIDER_SITE_OTHER): Payer: Medicare Other | Admitting: *Deleted

## 2010-06-15 DIAGNOSIS — I4891 Unspecified atrial fibrillation: Secondary | ICD-10-CM

## 2010-06-15 LAB — POCT INR: INR: 2.7

## 2010-06-23 ENCOUNTER — Ambulatory Visit (INDEPENDENT_AMBULATORY_CARE_PROVIDER_SITE_OTHER): Payer: Medicare Other | Admitting: Internal Medicine

## 2010-06-23 ENCOUNTER — Encounter: Payer: Self-pay | Admitting: Internal Medicine

## 2010-06-23 DIAGNOSIS — I442 Atrioventricular block, complete: Secondary | ICD-10-CM

## 2010-06-23 DIAGNOSIS — Z950 Presence of cardiac pacemaker: Secondary | ICD-10-CM | POA: Insufficient documentation

## 2010-06-23 DIAGNOSIS — I4891 Unspecified atrial fibrillation: Secondary | ICD-10-CM

## 2010-06-23 NOTE — Progress Notes (Signed)
  HPI  Alyssa [name] is a 77 y.o. female  seen in followup for complete heart block. She is status post pacemaker implantation. She has atrial fibrillation which is permanent. She is taking and tolerating her Coumadin. There have been no intercurrent problems with exercise intolerance shortness of breath dyspnea or chest pain.  She recently has been identified as having hyperkalemia (she thinks over 6) she is following with the nephrologist     Past Medical History  Diagnosis Date  . Hypothyroidism   . Osteoporosis   . Renal insufficiency   . Thyroid cancer   . Hx of breast cancer   . Atrial fibrillation   . MVP (mitral valve prolapse)   . Left inguinal hernia     direct  . IBS (irritable bowel syndrome)   . A-fib     pacemaker    Past Surgical History  Procedure Date  . Thyroidectomy   . Mastectomy 1982    Bilateral  . Tonsillectomy   . Pacemaker insertion     Current Outpatient Prescriptions  Medication Sig Dispense Refill  . acetaminophen (TYLENOL ARTHRITIS PAIN) 650 MG CR tablet Take 1,300 mg by mouth 2 (two) times daily.        . bifidobacterium infantis (ALIGN) capsule Take 1 capsule by mouth daily.        Marland Kitchen CALCIUM-VITAMIN D PO Take 1 tablet by mouth daily.        Marland Kitchen levothyroxine (SYNTHROID, LEVOTHROID) 88 MCG tablet Take 88 mcg by mouth daily.        Marland Kitchen loratadine (CLARITIN) 10 MG tablet Take 10 mg by mouth daily.        . metoprolol (LOPRESSOR) 50 MG tablet Take 25 mg by mouth 2 (two) times daily. 1/2 tab QAM & 1/2 tab QHs       . Multiple Vitamins-Minerals (CENTRUM SILVER) tablet Take 1 tablet by mouth daily.        Marland Kitchen warfarin (COUMADIN) 3 MG tablet Take by mouth as directed.        . warfarin (COUMADIN) 4 MG tablet Take by mouth as directed.        Marland Kitchen DISCONTD: Hydrocod Polst-Chlorphen Polst 10-8 MG CP12 Take 1 tablet by mouth 2 (two) times daily as needed (cough).  35 each  0    No Known Allergies  Review of Systems negative except from HPI and  PMH  Physical Exam Well developed and well nourished in no acute distress HENT normal E scleral and icterus clear Neck Supple JVP flat; carotids brisk and full Clear to ausculation Regular rate and rhythm, no murmurs gallops or rub Soft with active bowel sounds No clubbing cyanosis and edema Alert and oriented, grossly normal motor and sensory function Skin Warm and Dry   Assessment and  Plan

## 2010-06-23 NOTE — Assessment & Plan Note (Signed)
Stable post pacer 

## 2010-06-23 NOTE — Assessment & Plan Note (Signed)
[facility]                         ELECTROPHYSIOLOGY OFFICE NOTE   NAME:Alyssa [name], Alyssa [name]                         MRN:          [id number]  DATE:11/14/2007                            DOB:          05/04/1924    [name] is seen in followup for pacemaker implant for complete heart  block in the setting of now permanent atrial fibrillation.  We saw her  in the spring.  She was feeling quite short of breath.  We undertook an  echo demonstrated normal left ventricular function.  There is moderate  tricuspid regurgitation which was better than the echo a year ago where  it has been described as severe in January 2006 that was also described  in that way.   MEDICATIONS:  Unchanged.   PHYSICAL EXAMINATION:  VITAL SIGNS:  Blood pressure was 129/88 with a  pulse of 78.  LUNGS:  Clear.  HEART:  Regular.  EXTREMITIES:  Without edema.   Interrogation of Medtronic pulse generator demonstrates fibrillation  wave of 1, no intrinsic ventricular rhythm, impedance of 543, threshold  0.75 and 0.4, and battery voltage is 2.77.   IMPRESSION:  1. Complete heart block.  2. Atrial fibrillation - permanent.  3. Status post pacer for the above.  4. Resolution of transient left ventricular dysfunction with now      normal left ventricular function and tricuspid regurgitation -      improved, now moderate.   [name] is doing terrific.  I do not know what to attribute the  recovery of her heart muscles, but it is a blessing.  We will see her  again in 6 months' time.     Duke Salvia, MD, [facility]  Electronically Signed    SCK/MedQ  DD: 11/14/2007  DT: 11/15/2007  Job #: [id number]   cc:   Quita Skye. Artis Flock, M.D.

## 2010-06-23 NOTE — Assessment & Plan Note (Signed)
permanenet on warfarin

## 2010-06-23 NOTE — Assessment & Plan Note (Signed)
[facility]                         ELECTROPHYSIOLOGY OFFICE NOTE   NAME:Alyssa [name], Alyssa [name]                         MRN:          [id number]  DATE:05/31/2007                            DOB:          1924/03/29    Ms. Alyssa [name] comes in.  Ms. Alyssa [name] is seen in followup for complete heart  block with previously implanted pacemaker implantation.  She has  paroxysmal and now permanent atrial fibrillation.   She notes progressive problems with exercise intolerance over the last  number of months.  She has had intercurrent episode of bronchitis.  This  is manifested with exertion, and it is not notable at rest and is  unaccompanied by peripheral edema.   It is notable that her heart muscle strength evaluated by [facility] in  2001 was normal without ischemia.  An echocardiogram in 2007  demonstrated a moderate impairment LV function with ejection fraction of  40-45% and moderate to severe tricuspid regurgitation with a right  atrial enlargement and modest pulmonary hypertension.   Her medications currently include:  1. Metoprolol 25 b.i.d.  2. Coumadin.  3. Levoxyl.  4. Zantac.   On examination, her blood pressure was 127/88.  Her pulse was 65.  Her  weight was 185.  Her JVP was up about 9 cm which was a little less than  I thought it was going to be.  Her lungs were clear.  Her heart sounds were irregular with an early systolic murmur.  I did  not feel an RV heave.  The abdomen was soft with active bowel sounds.  EXTREMITIES:  Had no edema.  NEUROLOGICAL EXAM: Was grossly normal.  Her skin was warm and dry.   Interrogation of her ICD today demonstrated a fibrillation wave of 2  with no intrinsic ventricular rhythm.  Impedance was 521, threshold 0.75  at 0.4.  The fact that there was no ventricular rhythm with a  ventricular pacing of 81.3% demonstrates either frequent PVCs or change  in the overall status.   IMPRESSION:  1. Atrial fibrillation -  permanent.  2. Complete heart block.  3. Status post pacer for the above.  4. Progressive LV dysfunction accompanied by severe tricuspid      regurgitation, now progressive exercise intolerance.   The first question for Ms. Aaberg that comes to mind is why is she not  short of breath.  Specifically, does this relate to progressive LV  dysfunction, potentially attributable to her eccentric RV apical pacing?  Is there some aspect of her right-sided heart pathology which is  contributing to this?   We will plan to pursue this by looking at it with a 2-D echocardiogram  to look at both LV function and tricuspid regurgitation.   We will follow up with her by phone.  Otherwise, we will plan to see her  again in six months' time.     Duke Salvia, MD, [facility]  Electronically Signed    SCK/MedQ  DD: 05/31/2007  DT: 05/31/2007  Job #: [id number]   cc:   Quita Skye. Artis Flock, M.D.

## 2010-06-23 NOTE — Assessment & Plan Note (Signed)
[facility]                         ELECTROPHYSIOLOGY OFFICE NOTE   NAME:[name], Alyssa                         MRN:          [id number]  DATE:12/15/2006                            DOB:          17-Apr-1924    [name] was seen in the clinic today on December 15, 2006, for  followup of her Medtronic, model #VEDR01 Versa.  Date of implant was  December 10, 2005, for a complete heart block.   On interrogation of her device today, her battery voltage is 2.78.  P  waves measured 1 to 2.8 millivolts with an atrial lead impedance of 458  ohms.  Atrial threshold was not done.  She is in atrial fibrillation  95.2% of the time and is on Coumadin therapy.  R waves are not measured.  She is pacemaker-dependent to a rate of 30 with a ventricular capture  threshold of 0.75 volts at 0.4 milliseconds and a ventricular lead  impedance of 521 ohms.  Capture adaptive is programmed on in both the A  and the V.  She is ventricularly pacing 89.3% of the time.  No changes  were made in her parameters.   She will send a [facility] transmission in February with a return office  visit in 1 year.      Altha Harm, LPN  Electronically Signed      Duke Salvia, MD, [facility]  Electronically Signed   PO/MedQ  DD: 12/15/2006  DT: 12/15/2006  Job #: [id number]

## 2010-06-24 ENCOUNTER — Encounter: Payer: Medicare Other | Admitting: Internal Medicine

## 2010-06-26 NOTE — Discharge Summary (Signed)
Alyssa [name], Alyssa [name] NO.:  [id number]   MEDICAL RECORD NO.:  [id number]          PATIENT TYPE:  INP   LOCATION:  2853                         FACILITY:  [facility]   PHYSICIAN:  Duke Salvia, MD, FACCDATE OF BIRTH:  1924/03/20   DATE OF ADMISSION:  12/10/2005  DATE OF DISCHARGE:  12/10/2005                                 DISCHARGE SUMMARY   She has no known drug allergies.   PRINCIPAL DIAGNOSES:  1. Discharging status post pacemaker generator change.      a.     Explanted was a Medtronic kappa 701 KDR 701 implanted was a       Medtronic Versa VEDR01.  2. Recent change in exercise tolerance with device interrogation showing      the device at elective replacement indicator.   SECONDARY DIAGNOSES:  1. Permanent atrial fibrillation.      a.     Status post, atrioventricular node ablation with complete       atrioventricular block.  2. Treated hypothyroidism.  3. Chronic Coumadin.  4. Osteoporosis.  5. Hypertension.  6. Normal left ventricular function, moderately severe tricuspid      regurgitation, mild mitral regurgitation.  7. A 2-D echocardiogram June 2007.  This study shows that the ejection      fraction is 40-45%.  There is hypokinesis of the lateral wall and      hypokinesis of the mid to distal anteroseptal wall, severe tricuspid      valvular regurgitation.   PROCEDURE:  December 10, 2005, explantation of existing pacemaker which is at  end of life with implantation of a Medtronic Versa kappa dual-chamber  pacemaker Dr. Sherryl Manges.  The patient has a follow-up appointment for  incision check Monday, December 20, 2005 at 9 o'clock at the pacer clinic.  She is asked not to lift anything heavier than 10 pounds for a week.  She is  to keep her incision dry over the next 7 days and to sponge bathe until  Friday, December 17, 2005.  She may remove the bandage in the morning,  December 11, 3005 and keep the incision open to air.   DISCHARGE  MEDICATIONS:  1. Levoxyl 88 mcg daily.  2. Metoprolol 50 mg 1/2 tablet twice daily.  3. Coumadin as before this admission.  4. Multivitamin daily.  5. Hydrochlorothiazide 25 mg daily.  6. Fosamax 70 mg weekly.   BRIEF HISTORY:  Alyssa [name] is a 77 year old female.  She has visited the  office of [facility] November 30, 2005 because her device has reached  elective replacement indicator.  She has permanent atrial fibrillation with  complete A/V block following a nodal junction ablation.  She has no  intrinsic rhythm.  She has noted a significant change in her exercise  recently.  Her device was interrogated.  It has reverted VVI mode in order  to conserve the battery.  Otherwise she is doing well without complaints of  chest pain or shortness of breath.  Her battery voltage on interrogation was  2.61 and once again she was  in the VVIR mode.   HOSPITAL COURSE:  The patient presented electively December 10, 2005.  She  underwent explantation and implantation of a new device without any  complications and was ready to go home the same day.  Her incision without  hematoma and she will be able to achieve full range of motion of the left  arm secondary to the fact that there is no new lead placements.   Pertinent laboratory studies this admission taken on November 30, 2005.  Complete blood count:  White cells 5.3, hemoglobin 13.4, hematocrit 40.3,  platelets to 247.  The pro time 16.6, INR 1.7.  Sodium 140, potassium 4.9,  chloride 105, carbonate 28, glucose 88, BUN is 23, creatinine 1.2.      Maple Mirza, PA    ______________________________  Duke Salvia, MD, [facility]    GM/MEDQ  D:  12/10/2005  T:  12/11/2005  Job:  [id number]   cc:   Duke Salvia, MD, [facility]  Quita Skye. Artis Flock, M.D.

## 2010-06-26 NOTE — Procedures (Signed)
[place]. [facility]  Patient:    Alyssa [name], Alyssa [name]                         MRN: [id number] Proc. Date: 09/06/00 Adm. Date:  [id number] Attending:  Sabino Gasser CC:         Quita Skye. Artis Flock, M.D.   Procedure Report  PROCEDURE PERFORMED:  Upper endoscopy.  ENDOSCOPIST:  Sabino Gasser, M.D.  INDICATIONS FOR PROCEDURE:  Gastroesophageal reflux disease symptomatology.  ANESTHESIA:  Demerol 50 mg, Versed 5 mg.  DESCRIPTION OF PROCEDURE:  With the patient mildly sedated in the left lateral decubitus position, the Olympus video endoscope was inserted in the mouth and passed under direct vision through the esophagus, which appeared normal. Photograph taken.  We entered into the stomach.  The fundus, body, antrum, duodenal bulb and second portion of the duodenum were all well-viewed and appeared normal.  Again photographs were taken.  From this point, the endoscope was slowly withdrawn taking circumferential views of the entire duodenal mucosa until the endoscope had been pulled back into the stomach and placed on retroflexion to view the stomach from below and this, too appeared normal.  The endoscope was then straightened and withdrawn taking circumferential views of the entire gastric and subsequently esophageal mucosa, which otherwise appeared normal.  Patients vital signs and pulse oximeter remained stable.  The patient tolerated the procedure well without apparent complications.  FINDINGS:  Essentially normal upper endoscopic examination.  PLAN:  Will have patient follow up with me as an outpatient. Continue present therapy.  Proceed to colonoscopy. DD:  09/06/00 TD:  09/06/00 Job: [id number]

## 2010-06-26 NOTE — Op Note (Signed)
[place]. [facility]  Patient:    Alyssa [name], Alyssa [name] Visit Number: [id number] MRN: [id number]          Service Type: CAT Location: [facility] 2899 [id number] Attending Physician:  Nathen May Dictated by:   Nathen May, M.D., [facility] Admit Date:  05/03/2001 Discharge Date: 05/03/2001   CC:         Quita Skye. Artis Flock, M.D.  Electrophysiology Laboratory  [facility]   Operative Report  PREOPERATIVE DIAGNOSIS:  Paroxysmal atrial fibrillation with rapid ventricular response.,  POSTOPERATIVE DIAGNOSIS:  Paroxysmal atrial fibrillation with rapid ventricular response.  PROCEDURE PERFORMED:  AV ablation and His bundle electrogram measurement.  DESCRIPTION OF PROCEDURE:  Following the obtainment of informed consent, the patient was brought to the Electrophysiology Laboratory and placed on the fluoroscopic table in the supine position. After routine prep and drape, cardiac catheterization was performed with local anesthesia and conscious sedation.  Noninvasive blood pressure monitoring and transcutaneous oxygen saturation monitoring were performed continuously throughout the procedure. Following the procedure, the catheter was removed. Hemostasis was obtained and the patient was transferred to the floor in stable condition.  CATHETERS:  A 7 French 4 mm ablation tip catheter was inserted via Riley Kill (SR0) sheath to mapping sites at the AV junction.  Surface leads I, aVF and V1 were monitored continuously throughout the procedure.  Following insertion of the catheter, His bundle measurements were made and a RFO was applied.  RESULTS:  Surface electrocardiogram. Rhythm:  AV paced. Cycle length:  862 msec with prolongation of the AB delay.  The His bundle                electrogram was discerned and the HV interval was 61 msec.  Radiofrequency Energy:  A total of 44 seconds was applied in two applications to the sites where the A and V were  identified.  The AV ratio was 3:1.  Radio frequency energy caused complete heart block with a junctional escape rhythm. Following termination of the RF application, complete heart block was present. As only 13 seconds was applied initially a second 30 seconds was applied. Following this application there was a persistent junctional rhythm at a cycle length of approximately 1600 msec.  IMPRESSION: 1. Sinus node dysfunction. 2. Status post pacemaker for #1. 3. Paroxysmal atrial fibrillation. 4. Status post AV ablation for #3.  The patient tolerated the procedure well.  Her pacemaker is programmed in the DDIR node now with the lower rate of 65 beats per minute.  She will be observed for four hours and then anticipate discharge later this afternoon. Dictated by:   Nathen May, M.D., [facility] Attending Physician:  Nathen May DD:  05/03/01 TD:  05/04/01 Job: [id number]

## 2010-06-26 NOTE — Assessment & Plan Note (Signed)
[facility]                            CARDIOLOGY OFFICE NOTE   NAME:Alyssa [name], Alyssa [name]                         MRN:          [id number]  DATE:01/11/2006                            DOB:          Nov 22, 1924    This is a patient of Dr. Berton Mount.  The patient was also seen by Dr.  Ladona Ridgel.  Ms. Alyssa [name] is a very pleasant 77 year old white female patient  of Dr. Odessa Fleming who recently underwent exploration of her previously  implanted pacemaker with implantation of a new device on December 10, 2005 for end-of-life. She is pacer-dependent.  She said since her device  was inserted she has had gradually worsened discomfort at the pacer  site, and over the past week it has become reddened and sore all the way  down her left arm.  It is painful to touch.  She denies any fever,  chills, palpitations.   CURRENT MEDICATIONS:  1. Levoxyl 88 mcg daily.  2. Metoprolol 50 mg one-half b.i.d.  3. Coumadin.  4. Multivitamin daily.  5. Calcium 600 mg b.i.d.  6. Tylenol 2 b.i.d.  7. Fosamax 1 a week.  8. Citrucel 2 to 4 daily.   PHYSICAL EXAMINATION:  This is a very pleasant, young-looking, 81-year-  old white female in no acute distress.  Blood pressure is 115/90, pulse 80, weight 145, temperature 97.  NECK:  Without JVD, HR, bruit or thyroid enlargement.  LUNGS:  Clear anterior, posterior and lateral.  HEART:  Regular rate and rhythm at 70 beats per minute, with a 2/6  systolic ejection murmur at the left sternal border, normal S1 and S2,  no rub, bruit, thrill or heave noted.  ABDOMEN:  Soft without organomegaly, masses, lesions or abnormal  tenderness.  EXTREMITIES:  Without cyanosis, clubbing or edema.  The pacer site is erythematous, especially above the incision, and is a  little bit puffy and very tender to touch.  The pocket seems stable.   IMPRESSION:  1. Cellulitis at the pacer site with possible pocket infection.  2. Status post exploration of a  previously implanted pacemaker with      implantation of a new device, Medtronic Versa pulse generator.  3. Chronic atrial fibrillation, status post arteriovenous nodal      ablation with complete arteriovenous block.  4. Pacer-dependent.  5. Treated hypothyroidism.  6. Chronic Coumadin.  7. Osteoporosis.  8. Hypertension.  9. Moderately severe tricuspid regurgitation, mild mitral      regurgitation.  10.Ejection fraction of 40% to 45%.   PLAN:  Dr. Ladona Ridgel has opted to draw blood cultures and CBC here today  and start her on Keflex 500 mg q.i.d. for 2 weeks, and have her see Dr.  Graciela Husbands back this Friday to make sure it has improved.  If not, she will  need admitted.  The patient does have a trip planned to the [place]  next Tuesday that she is hoping to go on.      Jacolyn Reedy, PA-C  Electronically Signed      Doylene Canning. Ladona Ridgel, MD  Electronically Signed  ML/MedQ  DD: 01/11/2006  DT: 01/12/2006  Job #: [id number]

## 2010-06-26 NOTE — Assessment & Plan Note (Signed)
[facility]                         ELECTROPHYSIOLOGY OFFICE NOTE   NAME:[name], Alyssa                         MRN:          [id number]  DATE:05/25/2006                            DOB:          [birth year]    Alyssa [name] comes in 6 months after her pacemaker was replaced.  Fibrillation wave __________, she has no intrinsic rhythms.  Her  ventricular impedance was 525 with a threshold of 0.75 at 0.4.  Heart  rate excursion is adequate.  She feels great, her wound is well healed.  She was also concerned about familial atrial fibrillation, we talked  about that a little bit.   EXAMINATION:  Her blood pressure is 163/88, her pulse is 78, this blood  pressure is an anomaly.   We will plan to see her again as previously scheduled for the followup.  She will follow up with Dr. Artis Flock as previously scheduled and we will  have to pay close attention to her hypertension.     Duke Salvia, MD, [facility]  Electronically Signed    SCK/MedQ  DD: 05/25/2006  DT: 05/25/2006  Job #: [id number]   cc:   Quita Skye. Artis Flock, M.D.

## 2010-06-26 NOTE — Assessment & Plan Note (Signed)
[facility]                           ELECTROPHYSIOLOGY OFFICE NOTE   NAME:[name], Alyssa BARNER                         MRN:          [id number]  DATE:12/20/2005                            DOB:          1924/09/11    Patient was seen today in the clinic on 12/10/2005 for a wound check of her  newly implanted Medtronic Model number VEDR-01 Versa.  Date of implant was  12/10/2005 for complete heart block.  On interrogation of her device, today,  her battery voltage is 2.78, P waves measured 1.0 to 1.4 millivolts with  atrial lead impedance of 429.  Atrial capture threshold was 99.  She is in  chronic atrial fibrillation, 100% of the time.  R waves are not measured she  is pacemaker dependent to a rate of 30 with a ventricular pacing threshold  of 0.75 volts at 0.4 milliseconds and a ventricular lead impedance of 539.   The Steri-Strips had already been removed.  Her wound is without redness or  edema and she will be seen, again in April 2008 by Dr. Graciela Husbands.      Altha Harm, LPN  Electronically Signed      Duke Salvia, MD, [facility]  Electronically Signed   PO/MedQ  DD: 12/20/2005  DT: 12/20/2005  Job #: [id number]

## 2010-06-26 NOTE — Op Note (Signed)
NAMEMANASI, DISHON NO.:  [id number]   MEDICAL RECORD NO.:  [id number]          PATIENT TYPE:  INP   LOCATION:  2853                         FACILITY:  [facility]   PHYSICIAN:  Duke Salvia, MD, FACCDATE OF BIRTH:  08/24/1924   DATE OF PROCEDURE:  12/10/2005  DATE OF DISCHARGE:  12/10/2005                                 OPERATIVE REPORT   PREOPERATIVE DIAGNOSES:  Previously implanted pacemaker, complete heart  block, pacer dependence, now with device at end of life.   POSTOPERATIVE DIAGNOSES:  Previously implanted pacemaker, complete heart  block, pacer dependence, now with device at end of life.   PROCEDURE:  Exploration of a previously implanted pacemaker, with  implantation of a new device.   SURGEON:  Duke Salvia, MD, [facility]   DESCRIPTION OF PROCEDURE:  Following the obtaining of informed consent, the  patient was brought to the electrophysiology laboratory and placed on the  fluoroscopic table in the supine position.  After routine prep and drape of  the left upper chest, lidocaine was infiltrated along the line of the  previous incision and carried down to the layer of the device pocket.  I  used electrocautery and sharp dissection.  A pocket was formed similarly.  Hemostasis was obtained.   Thereafter, the pocket was opened and the device was explained.  The atrial  lead was assessed with a fibrillation wave of 1.4 with an impedance of 421.  I should noted that the previously implanted leads were St. Jude 1342 in the  atrium with serial #[id number], and the previously implanted ventricular lead  was a St. Jude 1346, serial [id number].  The bipolar R wave was 12.9 with  impedance of 547 ohms, a threshold of 1.1 V at 0.5 msec, and the current at  threshold was 2.2 mA.  With these acceptable perimeters recorded, the leads  we then attached to a Medtronic Versa VEDR01 pulse generator, serial  #[id number].  Ventricular pacing was identified.  The pocket was  copiously  irrigated with antibiotic-containing saline solution.  Hemostasis was  assured, and the leads and the pulse generator were placed in the pocket.  The pocket was opened up a little bit cephalad to allow for easy placement.  Hemostasis was assured, and the wound was then closed in 3 layers in the  normal fashion.  The wound was washed, dried, and a benzoin and Steri-Strips  dressing was applied.  Needle counts, sponge counts and instrument counts  were correct at the end of the procedure according to the staff.  The  patient tolerated the procedure without apparent complication.           ______________________________  Duke Salvia, MD, [facility]     SCK/MEDQ  D:  12/10/2005  T:  12/11/2005  Job:  [id number]   cc:   Quita Skye. Artis Flock, M.D.  Electrophysiology Laboratory  [facility]

## 2010-06-26 NOTE — Assessment & Plan Note (Signed)
[facility]                         ELECTROPHYSIOLOGY OFFICE NOTE   NAME:GOSNEYCeleste, Tavenner                           MRN:          [id number]  DATE:01/14/2006                            DOB:          03/08/1924    The patient comes in today because of concerns about a wound infection  related to a recent change out.  Her pocket today is erythematous with a  diameter of about 1.5 cm.  It is much less tender than it was 2 days ago  and she has been on antibiotics for these last 3 days.   I am quite concerned that this represents a pocket infection and not  withstanding any improvement on the antibiotics, I fear that she will  end up requiring explantation.  She is device-dependent.  I have  reviewed the above with her.  I have reviewed also with her the  potential implications of explantation including but not limited to the  risk of death, the need for hospitalization with a temporary permanent  pacemaker.   She understands.   We will plan to see her again about 12 days, a day or 2 following  discontinuation of her antibiotics.     Duke Salvia, MD, [facility]  Electronically Signed    SCK/MedQ  DD: 01/14/2006  DT: 01/15/2006  Job #: [id number]

## 2010-06-26 NOTE — Assessment & Plan Note (Signed)
[facility]                         ELECTROPHYSIOLOGY OFFICE NOTE   NAME:Alyssa [name], Alyssa [name]                         MRN:          [id number]  DATE:01/27/2006                            DOB:          04/10/1924    Alyssa [name] is seen.  She is status post device generator replacement.  We saw her a couple of weeks ago where there was a fair amount of  inflammation in the subcutaneous tissue.  We put her on 2 weeks of oral  antibiotics.  She comes back today, it is much improved.  The erythema  is almost gone.  The inflammation is all gone.  There is a little bit of  hyperkeratosis.  This was explored and part of the pull-through suture  seems to have been imbedded there; I removed that.   We will plan to see her again in 2 weeks' time.     Duke Salvia, MD, [facility]  Electronically Signed    SCK/MedQ  DD: 01/27/2006  DT: 01/27/2006  Job #: [id number]   cc:   Quita Skye. Artis Flock, M.D.

## 2010-06-26 NOTE — Letter (Signed)
November 30, 2005    Alyssa [name], M.D.  [address]  [place], [place]  [address]   RE:  Alyssa, [name]  MRN:  [id number]  /  DOB:  1924-12-23   Dear Alyssa [name]:   I hope this letter finds you well.  Alyssa [name] comes in today because her  pacemaker has reached the elective replacement indicator.  As you recall,  she has permanent atrial fibrillation and has complete A-V block following A-  V junction ablation.  She has no intrinsic rhythm.   She has noted a significant change in her exercise recently and on  interrogation of her device she has reverted to the VVI mode (no rate  response to conserve battery).   She is otherwise doing well without complaints of chest pain or shortness of  breath.   MEDICATIONS:  1. Levoxyl 88.  2. Metoprolol 25 b.i.d.  3. Coumadin.  4. Tylenol.  5. Fosamax.   She has no known drug allergies.   EXAMINATION:  VITAL SIGNS:  Blood pressure was 108/64.  Her pulse was 74.  LUNGS:  Clear.  HEART:  Heart sounds were regular.  EXTREMITIES:  Without edema.  NECK:  The neck veins were flat.   Interrogation of her Medtronic Kappa 700 device demonstrated an RV lead  impedance of 613 with threshold of 0.75 at 0.4.  Her battery voltage was  2.61 and as noted she was in the VVIR mode.  Notably also her output has  also gone to 5 volts.   Will plan to bring Alyssa [name] in next Friday for her device generator  replacement.  With her being device dependent, will plan to insert a  temporary transvenous femoral pacing lead.   Will maintain her on her Coumadin.  I reviewed with her the potential  benefits as well as the potential risks including but not limited to  infection and the implications of lead failure.   She understands and is willing to proceed.   Alyssa [name], again I hope this letter finds you well.    Sincerely,     ______________________________  Alyssa Salvia, MD, [facility]    SCK/MedQ  DD: 11/30/2005  DT: 12/01/2005  Job #: [id number]

## 2010-06-26 NOTE — H&P (Signed)
[facility]  Patient:    Alyssa [name], Alyssa [name]                         MRN: [id number] Adm. Date:  [id number] Attending:  Heather Roberts CC:         Quita Skye. Artis Flock, M.D.  Nathen May, M.D. [facility]  Sabino Gasser, M.D.   History and Physical  CHIEF COMPLAINT:  Syncope.  HISTORY OF PRESENT ILLNESS:  Alyssa [name] is a 77 year old white widowed female who was discharged after a syncopal and weak episode associated with an elevated INR on her chronic Coumadin Jul 07, 2000.  She felt quite well after discharge, until the night prior to admission, when she began to have palpitations and weakness and some shortness of breath.  On the morning of admission, she went to the bathroom and, while sitting and having a bowel movement but no straining to do so, she hit her head against the wall and lost consciousness and awakened quickly and came to the emergency room.  SOCIAL HISTORY:  A 77 year old white widowed female recently retired Print production planner for [employer].  CURRENT MEDICATIONS: 1. Synthroid 0.088. 2. Lopressor 50 one-half b.i.d. for MVP with palpitations. 3. Verelan 200 h.s. started by Dr. Graciela Husbands after her pacemaker. 4. Coumadin 5 pacemaker.  PAST MEDICAL HISTORY: 1. G4, P3; three living children and one miscarriage. 2. Approximately in 1990, thyroid resection for cancer, on Synthroid. 3. Approximately 1986, bilateral mastectomies for breast cancer. 4. Pacer for what sounds like question bradycardia, Dr. Graciela Husbands, who started her    on Verelan.  Prior history of MVP with palpitations, treated with    Lopressor. 5. May 2002 INR elevated and received fresh frozen plasma, 4 units.  She was    febrile after this.  FAMILY HISTORY:  Father deceased colon cancer, age 32.  He also had a pacemaker for sick sinus syndrome.  She denies other family history of cardiac, diabetes, lung cancer.  Three children alive and well.  REVIEW OF SYSTEMS:  HEENT:   Denies recent URI.  CARDIAC:  No chest pain. Syncope above.  PULMONARY:  No history of asthma.  She did have some shortness of breath associated with symptoms above.  GI:  Loose stools a year ago for six weeks responding to metronidazole.  Recurrent eight weeks ago; did not respond to antibiotics.  Saw Dr. Virginia Rochester upon referral from Dr. Artis Flock, who scheduled a colonoscopy.  At that time, her hemoglobin was "9.5."  Her diarrhea is actually improved since discharge on the diet recommended by the hospital May 2002 including Ensure and it had slowed down.  LABORATORY DATA:  EKG:  Normal sinus rhythm with frequent ectopic beats, LVH.  Potassium 4.  INR 3.  BUN 26.  CK 21.  White count 5, hemoglobin 11.3.  IMPRESSION:  Syncope in the setting of not straining, rule out cardiac.  PLAN:  Admission for observation, telemetry, cardiology consult, and neurologic consult as needed. DD:  07/17/00 TD:  07/18/00 Job: [id number]

## 2010-06-26 NOTE — Op Note (Signed)
Alyssa [name], NORDQUIST NO.:  [id number]   MEDICAL RECORD NO.:  [id number]          PATIENT TYPE:  AMB   LOCATION:  ENDO                         FACILITY:  [facility]   PHYSICIAN:  Georgiana Spinner, M.D.    DATE OF BIRTH:  22-May-1924   DATE OF PROCEDURE:  DATE OF DISCHARGE:                                 OPERATIVE REPORT   PROCEDURE:  Colonoscopy.   INDICATIONS:  Colon cancer screening.   ANESTHESIA:  Demerol 50, Versed 5 mg.   PROCEDURE:  With patient mildly sedated in the left lateral decubitus  position, the Olympus videoscopic colonoscope was inserted into the rectum  and passed through a rather tight sigmoid area.  The lumen was difficult to  find.  There were numerous deep diverticula that we photographed, and then  we were able to advance with pressure applied, to reach the cecum identified  by the ileocecal valve and base of cecum, both of which were photographed.  From this point, the colonoscope was slowly withdrawn, taking  circumferential views of colonic mucosa, stopping only in the rectum which  appeared normal, and the rectum showed hemorrhoids in retroflex view.  The  endoscope was straightened and withdrawn.  Patient's vital signs, pulse  oximeter, remained stable.  Patient tolerated the procedure well without  apparent complications.   FINDINGS:  Internal hemorrhoids, significant diverticulosis in the sigmoid  colon, otherwise an unremarkable exam.   PLAN:  Repeat in 5 years.           ______________________________  Georgiana Spinner, M.D.     GMO/MEDQ  D:  10/28/2005  T:  10/28/2005  Job:  [id number]

## 2010-06-26 NOTE — Discharge Summary (Signed)
[facility]  Patient:    Alyssa [name], Alyssa [name]                         MRN: [id number] Adm. Date:  [id number] Disc. Date: [id number] Attending:  Barkley Bruns CC:         Nathen May, M.D., [facility]  Luis Abed, M.D. [facility]   Discharge Summary  ADMISSION DIAGNOSES: 1. Syncope. 2. Chronic diarrhea. 3. Pacemaker for atrial fibrillation with Coumadin anticoagulation.  DISCHARGE DIAGNOSES: 1. Syncope secondary to atrial fibrillation and dehydration. 2. Chronic diarrhea. 3. Hypothyroidism.  CONSULTATIONS:  [facility], Nathen May, M.D., F.A.C.C., and Luis Abed, M.D.  HISTORY OF PRESENT ILLNESS:  This 77 year old white female was admitted by Heather Roberts, M.D., for me on July 17, 2000, with a history of having felt weak and noticing palpitations on the morning of admission.  She went to the bathroom and passed out while having a bowel movement.  She apparently was not straining, but lost consciousness, hit her head against the wall, awaken, and was brought to the emergency room at [facility].  She did say that she felt some palpitations and shortness of breath the evening prior to symptoms.  She denied any injuries from the fall.  She felt like she probably only was unconscious for a few seconds.  PHYSICAL EXAMINATION:  At the time of admission, her vital signs were stable. She was alert and oriented and in no acute distress.  LABORATORY DATA:  The EKG showed a normal sinus rhythm with frequent PACs and left ventricular hypertrophy changes.  Her potassium was 4.0.  The INR was 3.  BUN 26, creatinine 1.2.  Her white blood cell count on admission was 5.7, hemoglobin 11.3, and hematocrit 33.5. The INR on admission was 3.7.  CPK-MBs were negative.  Troponin was negative.  HOSPITAL COURSE:  The patient was admitted by Heather Roberts, M.D., to telemetry.  A cardiology consultation with [place] was obtained.  It  was felt that the patient likely had an episode of recurrence of her atrial fibrillation with an increased ventricular rate complicated by some underlying dehydration from her persistent diarrhea.  Her Lopressor was increased to 50 mg b.i.d.  Her pacemaker evaluation showed that the patient had been in atrial fibrillation and flutter starting at 9:30 p.m. on July 16, 2000, and persisted for 21 hours.  She had normal pacer function.  An echocardiogram showed an ejection fraction of 55-65%, there was left ventricular wall thickening, the aortic valve was mildly calcified, the right ventricle was mildly dilated, mild to moderate tricuspid regurgitation, the right atrium was mildly dilated, and there was felt to be a trivial pericardial effusion posterior to the heart.  A sedimentation rate obtained was 16.  The TSH level was 2.54, which was normal.  A follow-up CMET profile was within normal limits with a potassium of 4.0, a glucose of 104, a BUN of 18, and a creatinine of 1.1.  Normal liver function tests.  Her Coumadin was held at the time of admission and on July 19, 2000, her INR was 3.1, felt to be in the therapeutic range of 2-3.  Symptomatically the patients diarrhea resolved.  She had no more dizziness, headache, or weakness with being out of bed.  She felt ready to go home on July 20, 2000, and was discharged improved in her condition. Her diagnosis was felt to be syncope related to atrial fibrillation with  a rapid ventricular response and associated underlying dehydration.  DISPOSITION:  She was discharged to home.  DISCHARGE MEDICATIONS: 1. Lopressor 50 mg b.i.d. 2. Verelan 240 mg q.h.s. 3. Coumadin 5 mg q.d. 4. Synthroid 88 mcg q.d.  FOLLOW-UP:  The patient was advised not to drive secondary to the syncope until she was seen by Nathen May, M.D., F.A.C.C., in follow-up one week after discharge.  She would have an INR checked one week after discharge.  DIET:   Normal.  ACTIVITY:  Normal. DD:  08/07/00 TD:  08/07/00 Job: [id number]

## 2010-06-26 NOTE — Procedures (Signed)
[place]  Patient:    Alyssa [name], Alyssa [name]                         MRN: [id number] Proc. Date: 09/06/00 Adm. Date:  [id number] Attending:  Sabino Gasser CC:         Quita Skye. Artis Flock, M.D.   Procedure Report  PROCEDURE:  Colonoscopy.  GASTROENTEROLOGIST:  Sabino Gasser, M.D.  INDICATIONS:  Diarrhea which apparently now has subsided and colon cancer screening.  ANESTHESIA:  Demerol 10 mg.  PROCEDURE IN DETAIL:  With the patient mildly sedated and in the left lateral decubitus position, the Olympus video pediatric colonoscope, PCF160,  was inserted in the rectum after rectal exam was performed, and then passed under direct vision to the cecum identified by ileocecal valve and appendiceal orifice, both of which were photographed.  From this point, the colonoscope was slowly withdrawn, taking circumferential views of the entire colonic mucosa, stopping only then to photograph diverticulosis moderately severe seen in the sigmoid colon, until we reached the rectum which appeared normal on direct view and showed internal hemorrhoids on retroflexed view.  The endoscope was straightened and withdrawn.  The patients vital signs and pulse oximetry remained stable.  The patient tolerated the procedure well with no apparent complications.  FINDINGS: 1. Essentially normal-appearing mucosa.  Random biopsy taken because    of the history of diarrhea, although it has now subsided. 2. Diverticulosis of the sigmoid colon. 3. Internal hemorrhoids. 4. Otherwise unremarkable examination.  PLAN:  Will have patient follow up on results of biopsy and follow up with me as needed. DD:  09/06/00 TD:  09/06/00 Job: [id number]

## 2010-06-26 NOTE — Assessment & Plan Note (Signed)
[facility]                         ELECTROPHYSIOLOGY OFFICE NOTE   NAME:GOSNEYDulcey, Riederer                         MRN:          [id number]  DATE:02/11/2006                            DOB:          09/04/1924    Mrs. Pagliarulo is seen.  She has continued to be concerned about some  erythema.  The antibiotics were over.  It looks much better.  There is a  little bit of erythema still but she is now two weeks off of  antibiotics.  And, we will plan to see her again in 6 months' time.  She  is to let us know if she has any problems in the interim.     Duke Salvia, MD, [facility]  Electronically Signed    SCK/MedQ  DD: 02/11/2006  DT: 02/11/2006  Job #: [id number]   cc:   Quita Skye. Artis Flock, M.D.

## 2010-07-03 ENCOUNTER — Other Ambulatory Visit: Payer: Self-pay | Admitting: Internal Medicine

## 2010-07-13 ENCOUNTER — Ambulatory Visit (INDEPENDENT_AMBULATORY_CARE_PROVIDER_SITE_OTHER): Payer: Medicare Other | Admitting: *Deleted

## 2010-07-13 ENCOUNTER — Other Ambulatory Visit (INDEPENDENT_AMBULATORY_CARE_PROVIDER_SITE_OTHER): Payer: Medicare Other | Admitting: *Deleted

## 2010-07-13 DIAGNOSIS — I4891 Unspecified atrial fibrillation: Secondary | ICD-10-CM

## 2010-07-13 DIAGNOSIS — N183 Chronic kidney disease, stage 3 unspecified: Secondary | ICD-10-CM

## 2010-07-13 LAB — RENAL FUNCTION PANEL
Albumin: 3.7 g/dL (ref 3.5–5.2)
BUN: 25 mg/dL — ABNORMAL HIGH (ref 6–23)
CO2: 28 mEq/L (ref 19–32)
Chloride: 101 mEq/L (ref 96–112)
Phosphorus: 3.7 mg/dL (ref 2.3–4.6)
Potassium: 4 mEq/L (ref 3.5–5.1)

## 2010-07-17 ENCOUNTER — Telehealth: Payer: Self-pay | Admitting: Internal Medicine

## 2010-07-17 NOTE — Telephone Encounter (Signed)
Labs in [facility] were ordered under Dr. Kathrene Bongo. I have left the patient a message that these have been faxed to her.

## 2010-07-17 NOTE — Telephone Encounter (Signed)
Pt had lab work done on June 4th and it was suppose to be forwarded to  dr. Albesa Seen office and it wasn't and she needs it done asap

## 2010-07-20 ENCOUNTER — Telehealth: Payer: Self-pay | Admitting: Internal Medicine

## 2010-07-20 NOTE — Telephone Encounter (Signed)
Calling back to speak with heather regarding medication

## 2010-07-20 NOTE — Telephone Encounter (Signed)
Resent labs to Dr Kathrene Bongo  Pt aware of lab results

## 2010-08-10 ENCOUNTER — Ambulatory Visit (INDEPENDENT_AMBULATORY_CARE_PROVIDER_SITE_OTHER): Payer: Medicare Other | Admitting: *Deleted

## 2010-08-10 DIAGNOSIS — I4891 Unspecified atrial fibrillation: Secondary | ICD-10-CM

## 2010-08-11 ENCOUNTER — Telehealth: Payer: Self-pay

## 2010-08-11 ENCOUNTER — Ambulatory Visit (INDEPENDENT_AMBULATORY_CARE_PROVIDER_SITE_OTHER): Payer: Medicare Other | Admitting: Internal Medicine

## 2010-08-11 ENCOUNTER — Encounter: Payer: Self-pay | Admitting: Internal Medicine

## 2010-08-11 DIAGNOSIS — J309 Allergic rhinitis, unspecified: Secondary | ICD-10-CM

## 2010-08-11 DIAGNOSIS — J209 Acute bronchitis, unspecified: Secondary | ICD-10-CM

## 2010-08-11 MED ORDER — CETIRIZINE HCL 10 MG PO TABS: 10.0000 mg | ORAL_TABLET | Freq: Every day | ORAL | Status: DC

## 2010-08-11 MED ORDER — CEFUROXIME AXETIL 500 MG PO TABS: 500.0000 mg | ORAL_TABLET | Freq: Two times a day (BID) | ORAL | Status: AC

## 2010-08-11 NOTE — Patient Instructions (Signed)
It was good to see you today. Ceftin antibiotics and change loratadine to generic zyrtec as dsicussed - ok to continue Mucinex Your prescription(s) have been submitted to your pharmacy. Please take as directed and contact our office if you believe you are having problem(s) with the medication(s).

## 2010-08-11 NOTE — Telephone Encounter (Signed)
Pt called to find out if she had ever been prescribed Ceftin. Pt was advised that she has been Rx'd medication in March by Dr Yetta Barre.

## 2010-08-11 NOTE — Progress Notes (Signed)
  Subjective:     Alyssa [name] is a 77 y.o. female here for evaluation of a cough. Onset of symptoms was 3 days ago. Symptoms have been gradually worsening since that time. The cough is productive and raspy and is aggravated by reclining position. Associated symptoms include: change in voice, chills, postnasal drip and shortness of breath. Patient does not have a history of asthma. Patient does have a history of environmental allergens. Patient has not traveled recently. Patient does have a history of smoking. Patient has had a previous chest x-ray. Patient has not had a PPD done.  The following portions of the patient's history were reviewed and updated as appropriate: allergies, current medications, past family history, past medical history, past social history, past surgical history and problem list.  Review of Systems Pertinent items are noted in HPI.    Objective:    Oxygen saturation 97% on room air BP 110/72  Pulse 84  Temp(Src) 97.5 F (36.4 C) (Oral)  Ht 5\' 6"  (1.676 m)  Wt 144 lb (65.318 kg)  BMI 23.24 kg/m2  SpO2 97% General appearance: alert, cooperative, appears stated age and no distress Head: Normocephalic, without obvious abnormality, atraumatic Eyes: conjunctivae/corneas clear. PERRL, EOM's intact. Fundi benign. Ears: normal TM's and external ear canals both ears Throat: lips, mucosa, and tongue normal; teeth and gums normal Lungs: clear to auscultation bilaterally Heart: irregularly irregular rhythm    Assessment:    Acute Bronchitis and Allergic Rhinitis    Plan:    Antibiotics per medication orders. Antitussives per medication orders. Avoid exposure to tobacco smoke and fumes. Call if shortness of breath worsens, blood in sputum, change in character of cough, development of fever or chills, inability to maintain nutrition and hydration. Avoid exposure to tobacco smoke and fumes. Trial of antihistamines.

## 2010-08-11 NOTE — Telephone Encounter (Signed)
Patient called stating that she was seen by Dr Felicity Coyer today and given Cipro. She is requesting that her nurse call back about med. Thanks

## 2010-09-02 ENCOUNTER — Other Ambulatory Visit: Payer: Self-pay | Admitting: Internal Medicine

## 2010-09-07 ENCOUNTER — Encounter: Payer: Self-pay | Admitting: Internal Medicine

## 2010-09-07 ENCOUNTER — Ambulatory Visit (INDEPENDENT_AMBULATORY_CARE_PROVIDER_SITE_OTHER): Payer: Medicare Other | Admitting: *Deleted

## 2010-09-07 ENCOUNTER — Ambulatory Visit (INDEPENDENT_AMBULATORY_CARE_PROVIDER_SITE_OTHER): Payer: Medicare Other | Admitting: Internal Medicine

## 2010-09-07 VITALS — BP 116/76 | HR 84 | Temp 97.7°F | Resp 16 | Wt 144.0 lb

## 2010-09-07 DIAGNOSIS — J3089 Other allergic rhinitis: Secondary | ICD-10-CM

## 2010-09-07 DIAGNOSIS — R200 Anesthesia of skin: Secondary | ICD-10-CM

## 2010-09-07 DIAGNOSIS — R209 Unspecified disturbances of skin sensation: Secondary | ICD-10-CM

## 2010-09-07 DIAGNOSIS — I4891 Unspecified atrial fibrillation: Secondary | ICD-10-CM

## 2010-09-07 LAB — POCT INR: INR: 2.8

## 2010-09-07 MED ORDER — FLUTICASONE FUROATE 27.5 MCG/SPRAY NA SUSP: 2.0000 | Freq: Every day | NASAL | Status: DC

## 2010-09-07 NOTE — Progress Notes (Signed)
Subjective:    Patient ID: Alyssa [name], female    DOB: [birth year], 77 y.o.   MRN: [id number]  HPI She returns c/o a 6 week history of numbness in her left foot of rather abrupt onset.  Also she is having a lot of postnasal drip.   Review of Systems  Constitutional: Negative for fever, chills, diaphoresis, activity change, appetite change, fatigue and unexpected weight change.  HENT: Positive for rhinorrhea, sneezing and postnasal drip. Negative for hearing loss, ear pain, nosebleeds, congestion, sore throat, facial swelling, drooling, mouth sores, trouble swallowing, neck pain, neck stiffness, dental problem, voice change, sinus pressure, tinnitus and ear discharge.   Eyes: Negative.   Respiratory: Negative for apnea, cough, choking, chest tightness, shortness of breath, wheezing and stridor.   Cardiovascular: Negative for chest pain, palpitations and leg swelling.  Gastrointestinal: Negative for nausea, vomiting, abdominal pain and diarrhea.  Genitourinary: Negative for dysuria, urgency, frequency, hematuria, flank pain, enuresis, difficulty urinating and dyspareunia.  Musculoskeletal: Negative for myalgias, back pain, joint swelling, arthralgias and gait problem.  Skin: Negative for color change, pallor, rash and wound.  Neurological: Positive for numbness. Negative for dizziness, tremors, seizures, syncope, facial asymmetry, speech difficulty, weakness, light-headedness and headaches.  Hematological: Negative for adenopathy. Does not bruise/bleed easily.  Psychiatric/Behavioral: Negative.        Objective:   Physical Exam  Vitals reviewed. Constitutional: She is oriented to person, place, and time. She appears well-developed and well-nourished. No distress.  HENT:  Head: Normocephalic and atraumatic.  Right Ear: External ear normal.  Left Ear: External ear normal.  Nose: Nose normal.  Mouth/Throat: Oropharynx is clear and moist. No oropharyngeal exudate.  Eyes: Conjunctivae  and EOM are normal. Pupils are equal, round, and reactive to light. Right eye exhibits no discharge. Left eye exhibits no discharge. No scleral icterus.  Neck: Normal range of motion. Neck supple. No JVD present. No tracheal deviation present. No thyromegaly present.  Cardiovascular: Normal rate, regular rhythm, normal heart sounds and intact distal pulses.  Exam reveals no gallop and no friction rub.   No murmur heard. Pulmonary/Chest: Effort normal and breath sounds normal. No stridor. No respiratory distress. She has no wheezes. She has no rales. She exhibits no tenderness.  Abdominal: Soft. Bowel sounds are normal. She exhibits no distension. There is no tenderness. There is no rebound and no guarding.  Musculoskeletal: Normal range of motion. She exhibits no edema and no tenderness.  Lymphadenopathy:    She has no cervical adenopathy.  Neurological: She is alert and oriented to person, place, and time. She has normal reflexes. She displays normal reflexes. No cranial nerve deficit. She exhibits normal muscle tone. Coordination normal.  Skin: Skin is warm and dry. No rash noted. She is not diaphoretic. No erythema. No pallor.  Psychiatric: She has a normal mood and affect. Her behavior is normal. Judgment and thought content normal.      Lab Results  Component Value Date   WBC 6.7 10/14/2009   HGB 13.3 10/14/2009   HCT 39.2 10/14/2009   PLT 198.0 10/14/2009   CHOL 156 02/26/2009   TRIG 60.0 02/26/2009   HDL 45.20 02/26/2009   ALT 18 10/14/2009   AST 24 10/14/2009   NA 136 07/13/2010   K 4.0 07/13/2010   CL 101 07/13/2010   CREATININE 1.2 07/13/2010   BUN 25* 07/13/2010   CO2 28 07/13/2010   TSH 0.89 04/23/2010   INR 2.0 08/10/2010      Assessment & Plan:  No problem-specific assessment & plan notes found for this encounter.

## 2010-09-07 NOTE — Assessment & Plan Note (Signed)
Start veramyst ns

## 2010-09-07 NOTE — Patient Instructions (Signed)
Allergic Rhinitis Allergic rhinitis is when the mucous membranes in the nose respond to allergens. Allergens are particles in the air that cause your body to have an allergic reaction. This causes you to release allergic antibodies. Through a chain of events, these eventually cause you to release histamine into the blood stream (hence the use of antihistamines). Although meant to be protective to the body, it is this release that causes your discomfort, such as frequent sneezing, congestion and an itchy runny nose.  CAUSES The pollen allergens may come from grasses, trees, and weeds. This is seasonal allergic rhinitis, or "hay fever." Other allergens cause year-round allergic rhinitis (perennial allergic rhinitis) such as house dust mite allergen, pet dander and mold spores.  SYMPTOMS  Nasal stuffiness (congestion).   Runny, itchy nose with sneezing and tearing of the eyes.   There is often an itching of the mouth, eyes and ears.  It cannot be cured, but it can be controlled with medications. DIAGNOSIS If you are unable to determine the offending allergen, skin or blood testing may find it. TREATMENT  Avoid the allergen.   Medications and allergy shots (immunotherapy) can help.   Hay fever may often be treated with antihistamines in pill or nasal spray forms. Antihistamines block the effects of histamine. There are over-the-counter medicines that may help with nasal congestion and swelling around the eyes. Check with your caregiver before taking or giving this medicine.  If the treatment above does not work, there are many new medications your caregiver can prescribe. Stronger medications may be used if initial measures are ineffective. Desensitizing injections can be used if medications and avoidance fails. Desensitization is when a patient is given ongoing shots until the body becomes less sensitive to the allergen. Make sure you follow up with your caregiver if problems continue. SEEK  MEDICAL CARE IF:   You develop fever (more than 100.5F (38.1 C).   You develop a cough that does not stop easily (persistent).   You have shortness of breath.   You start wheezing.   Symptoms interfere with normal daily activities.  Document Released: 10/20/2000 Document Re-Released: 02/16/2009 Exit[employer] Patient Information 2011 [employer]. 

## 2010-09-07 NOTE — Assessment & Plan Note (Signed)
I have asked her to do a NCS to find out what is causing her symptoms

## 2010-09-21 ENCOUNTER — Other Ambulatory Visit: Payer: Self-pay | Admitting: Internal Medicine

## 2010-10-05 ENCOUNTER — Ambulatory Visit (INDEPENDENT_AMBULATORY_CARE_PROVIDER_SITE_OTHER): Payer: Medicare Other | Admitting: *Deleted

## 2010-10-05 DIAGNOSIS — I4891 Unspecified atrial fibrillation: Secondary | ICD-10-CM

## 2010-11-02 ENCOUNTER — Ambulatory Visit (INDEPENDENT_AMBULATORY_CARE_PROVIDER_SITE_OTHER): Payer: Medicare Other | Admitting: *Deleted

## 2010-11-02 DIAGNOSIS — I4891 Unspecified atrial fibrillation: Secondary | ICD-10-CM

## 2010-11-18 ENCOUNTER — Telehealth: Payer: Self-pay | Admitting: *Deleted

## 2010-11-18 NOTE — Telephone Encounter (Signed)
Patient requesting a call back regarding a colonoscopy.

## 2010-11-18 NOTE — Telephone Encounter (Signed)
Patient asked about colonoscopy appt. I advised pt that she is established with GI and will nee to speak with them regarding the recall list. Patient was transferred to GI

## 2010-11-27 ENCOUNTER — Ambulatory Visit (INDEPENDENT_AMBULATORY_CARE_PROVIDER_SITE_OTHER): Payer: Medicare Other

## 2010-11-27 ENCOUNTER — Encounter: Payer: Self-pay | Admitting: Internal Medicine

## 2010-11-27 ENCOUNTER — Ambulatory Visit (INDEPENDENT_AMBULATORY_CARE_PROVIDER_SITE_OTHER): Payer: Medicare Other | Admitting: Internal Medicine

## 2010-11-27 DIAGNOSIS — Z23 Encounter for immunization: Secondary | ICD-10-CM

## 2010-11-27 DIAGNOSIS — K589 Irritable bowel syndrome without diarrhea: Secondary | ICD-10-CM

## 2010-11-27 DIAGNOSIS — Z7901 Long term (current) use of anticoagulants: Secondary | ICD-10-CM

## 2010-11-27 NOTE — Assessment & Plan Note (Signed)
Stable and unchanged with some right lower quadrant discomfort or pain at times and loose stools. Nothing new done today.

## 2010-11-27 NOTE — Patient Instructions (Signed)
Return as needed

## 2010-11-27 NOTE — Progress Notes (Signed)
[id number] [birth year]  77 year old white woman here to discuss possible repeat routine screening colonoscopy. Her father developed colon cancer at the age of 55. She has had 2 colonoscopies in the last 10 years, in 2007 in 2002 and I think before that as well. She has never had colon polyps. She has stable irritable bowel syndrome with intermittent loose stools or diarrhea and right lower quadrant pain. That is unchanged over years. She uses Citrucel with success and is also on a probiotic. She has had random colon biopsies in the past which were negative.  Assessment and plan:   Irritable bowel syndrome  This is stable and unchanged. She will continue current therapy. Return as needed if there are flares.  Regarding colorectal cancer screening, she is routine risk and does not need repeat colonoscopy. I would not continue it in the future given her age. Her father did have colon cancer but he was not below the age of 31-65 and diagnosed. I've explained this to her and she accepts this.

## 2010-11-30 ENCOUNTER — Ambulatory Visit (INDEPENDENT_AMBULATORY_CARE_PROVIDER_SITE_OTHER): Payer: Medicare Other | Admitting: *Deleted

## 2010-11-30 DIAGNOSIS — I4891 Unspecified atrial fibrillation: Secondary | ICD-10-CM

## 2010-11-30 LAB — POCT INR: INR: 2

## 2010-12-24 ENCOUNTER — Encounter: Payer: Self-pay | Admitting: Internal Medicine

## 2010-12-24 ENCOUNTER — Ambulatory Visit (INDEPENDENT_AMBULATORY_CARE_PROVIDER_SITE_OTHER): Payer: Medicare Other | Admitting: *Deleted

## 2010-12-24 DIAGNOSIS — I495 Sick sinus syndrome: Secondary | ICD-10-CM

## 2010-12-24 LAB — PACEMAKER DEVICE OBSERVATION
BATTERY VOLTAGE: 2.76 V
BMOD-0001RV: LOW
BMOD-0003RV: 30
BMOD-0005RV: 95 {beats}/min
RV LEAD IMPEDENCE PM: 537 Ohm
RV LEAD THRESHOLD: 0.625 V
VENTRICULAR PACING PM: 100

## 2010-12-24 NOTE — Progress Notes (Signed)
PPM check 

## 2010-12-25 ENCOUNTER — Other Ambulatory Visit: Payer: Self-pay | Admitting: Dermatology

## 2011-01-04 ENCOUNTER — Other Ambulatory Visit: Payer: Self-pay | Admitting: Internal Medicine

## 2011-01-11 ENCOUNTER — Encounter: Payer: Medicare Other | Admitting: *Deleted

## 2011-01-12 ENCOUNTER — Ambulatory Visit (INDEPENDENT_AMBULATORY_CARE_PROVIDER_SITE_OTHER): Payer: Medicare Other | Admitting: *Deleted

## 2011-01-12 DIAGNOSIS — I4891 Unspecified atrial fibrillation: Secondary | ICD-10-CM

## 2011-01-12 LAB — POCT INR: INR: 3.1

## 2011-02-23 ENCOUNTER — Ambulatory Visit (INDEPENDENT_AMBULATORY_CARE_PROVIDER_SITE_OTHER): Payer: Medicare Other | Admitting: *Deleted

## 2011-02-23 DIAGNOSIS — I4891 Unspecified atrial fibrillation: Secondary | ICD-10-CM

## 2011-04-06 ENCOUNTER — Encounter: Payer: Medicare Other | Admitting: *Deleted

## 2011-04-13 ENCOUNTER — Ambulatory Visit (INDEPENDENT_AMBULATORY_CARE_PROVIDER_SITE_OTHER): Payer: Medicare Other | Admitting: Pharmacist

## 2011-04-13 DIAGNOSIS — I4891 Unspecified atrial fibrillation: Secondary | ICD-10-CM

## 2011-05-25 ENCOUNTER — Ambulatory Visit (INDEPENDENT_AMBULATORY_CARE_PROVIDER_SITE_OTHER): Payer: Medicare Other | Admitting: Pharmacist

## 2011-05-25 DIAGNOSIS — I4891 Unspecified atrial fibrillation: Secondary | ICD-10-CM

## 2011-05-25 LAB — POCT INR: INR: 2.1

## 2011-05-26 ENCOUNTER — Other Ambulatory Visit: Payer: Self-pay | Admitting: Internal Medicine

## 2011-06-01 ENCOUNTER — Telehealth: Payer: Self-pay

## 2011-06-01 MED ORDER — LEVOTHYROXINE SODIUM 88 MCG PO CAPS: 1.0000 | ORAL_CAPSULE | Freq: Every day | ORAL | Status: DC

## 2011-06-01 NOTE — Telephone Encounter (Signed)
Changed to tirosint

## 2011-06-01 NOTE — Telephone Encounter (Signed)
Received fax from pharmacy stating that levoxyl has been recalled and now unavailable. They would like to know if MD can change to generic or brand synthroid. Thank

## 2011-06-06 ENCOUNTER — Telehealth: Payer: Self-pay | Admitting: Physician Assistant

## 2011-06-06 NOTE — Telephone Encounter (Signed)
Patient called this morning with complaint of new onset, severe left scapular pain. She also reports some SOB this morning. She denies any exacerbation with movement or deep inspiration. She denies any anterior chest discomfort. She is status post remote pacemaker implantation, and is due for annual followup with Dr. Graciela Husbands next month. She did not have this discomfort last evening, and states it has been persistent since approximately 7:30, this morning.  The patient is quite concerned about this new, severe pain. Therefore, I initially advised that she called EMS. However, she preferred to be evaluated at a walk-in clinic, and I concurred. However, I advised her to not drive, and she agreed to have a friend transport her. She was appreciative of the call back, and concurred with the plan.

## 2011-06-07 ENCOUNTER — Telehealth: Payer: Self-pay

## 2011-06-07 MED ORDER — LEVOTHYROXINE SODIUM 88 MCG PO TABS: 88.0000 ug | ORAL_TABLET | Freq: Every day | ORAL | Status: DC

## 2011-06-07 NOTE — Telephone Encounter (Signed)
Patient called Alyssa [name] requesting medication refills, did not states what is needed or pharmacy. I returned call back to pt,Alyssa [name] to call back with details.  I received fax from pharmacy stating that rx for tirosint was sent 4/24 but pt cannot afford $91.74. Patient request generic synthroid

## 2011-06-09 ENCOUNTER — Telehealth: Payer: Self-pay

## 2011-06-09 NOTE — Telephone Encounter (Signed)
Patient called [facility] c/o aches and thinks she needs appt

## 2011-06-10 ENCOUNTER — Encounter: Payer: Self-pay | Admitting: Internal Medicine

## 2011-06-10 ENCOUNTER — Ambulatory Visit (INDEPENDENT_AMBULATORY_CARE_PROVIDER_SITE_OTHER): Payer: Medicare Other | Admitting: Internal Medicine

## 2011-06-10 ENCOUNTER — Other Ambulatory Visit (INDEPENDENT_AMBULATORY_CARE_PROVIDER_SITE_OTHER): Payer: Medicare Other

## 2011-06-10 ENCOUNTER — Ambulatory Visit (INDEPENDENT_AMBULATORY_CARE_PROVIDER_SITE_OTHER)
Admission: RE | Admit: 2011-06-10 | Discharge: 2011-06-10 | Disposition: A | Discharge status: Home / Self Care | Payer: Medicare Other | Source: Ambulatory Visit | Attending: Internal Medicine | Admitting: Internal Medicine

## 2011-06-10 VITALS — BP 132/70 | HR 71 | Temp 97.3°F | Resp 16 | Wt 143.0 lb

## 2011-06-10 DIAGNOSIS — M810 Age-related osteoporosis without current pathological fracture: Secondary | ICD-10-CM

## 2011-06-10 DIAGNOSIS — E039 Hypothyroidism, unspecified: Secondary | ICD-10-CM

## 2011-06-10 DIAGNOSIS — R10817 Generalized abdominal tenderness: Secondary | ICD-10-CM

## 2011-06-10 DIAGNOSIS — M542 Cervicalgia: Secondary | ICD-10-CM

## 2011-06-10 DIAGNOSIS — I4891 Unspecified atrial fibrillation: Secondary | ICD-10-CM

## 2011-06-10 LAB — CBC WITH DIFFERENTIAL/PLATELET
Basophils Relative: 0.6 % (ref 0.0–3.0)
Eosinophils Relative: 0.7 % (ref 0.0–5.0)
Lymphocytes Relative: 13.7 % (ref 12.0–46.0)
MCV: 93.6 fl (ref 78.0–100.0)
Monocytes Absolute: 1 10*3/uL (ref 0.1–1.0)
Monocytes Relative: 9.9 % (ref 3.0–12.0)
Neutrophils Relative %: 75.1 % (ref 43.0–77.0)
Platelets: 188 10*3/uL (ref 150.0–400.0)
RBC: 4.5 Mil/uL (ref 3.87–5.11)
WBC: 9.7 10*3/uL (ref 4.5–10.5)

## 2011-06-10 LAB — LIPID PANEL
HDL: 56.6 mg/dL (ref >39.00)
Total CHOL/HDL Ratio: 2
Triglycerides: 79 mg/dL (ref 0.0–149.0)

## 2011-06-10 LAB — COMPREHENSIVE METABOLIC PANEL
Albumin: 4.3 g/dL (ref 3.5–5.2)
Alkaline Phosphatase: 51 U/L (ref 39–117)
BUN: 28 mg/dL — ABNORMAL HIGH (ref 6–23)
CO2: 26 mEq/L (ref 19–32)
Calcium: 9 mg/dL (ref 8.4–10.5)
GFR: 52.93 mL/min — ABNORMAL LOW (ref >60.00)
Glucose, Bld: 103 mg/dL — ABNORMAL HIGH (ref 70–99)
Potassium: 4.5 mEq/L (ref 3.5–5.1)
Sodium: 140 mEq/L (ref 135–145)
Total Protein: 7 g/dL (ref 6.0–8.3)

## 2011-06-10 LAB — URINALYSIS, ROUTINE W REFLEX MICROSCOPIC
Specific Gravity, Urine: >=1.03 (ref 1.000–1.030)
Total Protein, Urine: 30
Urine Glucose: NEGATIVE
Urobilinogen, UA: 0.2 (ref 0.0–1.0)
pH: 5.5 (ref 5.0–8.0)

## 2011-06-10 LAB — SEDIMENTATION RATE: Sed Rate: 21 mm/hr (ref 0–22)

## 2011-06-10 LAB — AMYLASE: Amylase: 59 U/L (ref 27–131)

## 2011-06-10 MED ORDER — HYDROCODONE-ACETAMINOPHEN 5-500 MG PO TABS: 2.0000 | ORAL_TABLET | Freq: Four times a day (QID) | ORAL | Status: DC | PRN

## 2011-06-10 NOTE — Assessment & Plan Note (Signed)
She needs a f/up BMD test

## 2011-06-10 NOTE — Assessment & Plan Note (Signed)
I think she has rather severe DDD, I will check her c-spine xray today to see if she has an occult fracture or some other complication and have started vicodin for symptom relief

## 2011-06-10 NOTE — Assessment & Plan Note (Signed)
She has good rate and rhythm control 

## 2011-06-10 NOTE — Assessment & Plan Note (Signed)
I will check her TSH level today 

## 2011-06-10 NOTE — Progress Notes (Signed)
Subjective:    Patient ID: Alyssa [name], female    DOB: 10-Oct-1924, 78 y.o.   MRN: [id number]  Neck Pain  This is a new problem. The current episode started in the past 7 days. The problem occurs constantly. The problem has been gradually worsening. The pain is associated with nothing. The pain is present in the midline. The quality of the pain is described as stabbing. The pain is at a severity of 7/10. The pain is severe. The symptoms are aggravated by position and bending. The pain is same all the time. Stiffness is present all day. Pertinent negatives include no chest pain, fever, headaches, leg pain, numbness, pain with swallowing, paresis, photophobia, syncope, tingling, trouble swallowing, visual change, weakness or weight loss. She has tried nothing for the symptoms. The treatment provided no relief.  Abdominal Pain This is a recurrent problem. The current episode started in the past 7 days. The onset quality is gradual. The problem occurs intermittently. The most recent episode lasted 5 days. The problem has been unchanged. The pain is located in the suprapubic region. The pain is at a severity of 1/10. The pain is mild. The quality of the pain is aching. The abdominal pain does not radiate. Associated symptoms include diarrhea. Pertinent negatives include no anorexia, arthralgias, belching, constipation, dysuria, fever, flatus, frequency, headaches, hematochezia, hematuria, melena, myalgias, nausea, vomiting or weight loss. The pain is aggravated by nothing. The pain is relieved by bowel movements. She has tried nothing for the symptoms. Her past medical history is significant for irritable bowel syndrome.      Review of Systems  Constitutional: Negative for fever, chills, weight loss, diaphoresis, activity change, appetite change, fatigue and unexpected weight change.  HENT: Positive for neck pain and neck stiffness. Negative for hearing loss, ear pain, nosebleeds, congestion, sore throat,  facial swelling, rhinorrhea, sneezing, drooling, mouth sores, trouble swallowing, dental problem, voice change, postnasal drip, sinus pressure, tinnitus and ear discharge.   Eyes: Negative.  Negative for photophobia.  Cardiovascular: Negative for chest pain, palpitations, leg swelling and syncope.  Gastrointestinal: Positive for abdominal pain and diarrhea. Negative for nausea, vomiting, constipation, blood in stool, melena, hematochezia, abdominal distention, anal bleeding, rectal pain, anorexia and flatus.  Genitourinary: Negative for dysuria, urgency, frequency, hematuria, flank pain, decreased urine volume, vaginal bleeding, vaginal discharge, enuresis, difficulty urinating, genital sores, vaginal pain, menstrual problem, pelvic pain and dyspareunia.  Musculoskeletal: Negative for myalgias, back pain, joint swelling, arthralgias and gait problem.  Skin: Negative for color change, pallor, rash and wound.  Neurological: Negative for dizziness, tingling, tremors, seizures, syncope, facial asymmetry, speech difficulty, weakness, light-headedness, numbness and headaches.  Hematological: Negative for adenopathy. Does not bruise/bleed easily.  Psychiatric/Behavioral: Negative.        Objective:   Physical Exam  Vitals reviewed. Constitutional: She appears well-developed and well-nourished. No distress.  HENT:  Head: Normocephalic and atraumatic.  Mouth/Throat: Oropharynx is clear and moist. No oropharyngeal exudate.  Eyes: Conjunctivae are normal. Right eye exhibits no discharge. Left eye exhibits no discharge. No scleral icterus.  Neck: Normal range of motion. Neck supple. No JVD present. No tracheal deviation present. No thyromegaly present.  Cardiovascular: Normal rate, regular rhythm, normal heart sounds and intact distal pulses.  Exam reveals no gallop and no friction rub.   No murmur heard. Pulmonary/Chest: Effort normal and breath sounds normal. No stridor. No respiratory distress. She  has no wheezes. She has no rales. She exhibits no tenderness.  Abdominal: Soft. Normal appearance. She exhibits no  shifting dullness, no distension, no pulsatile liver, no fluid wave, no abdominal bruit, no ascites, no pulsatile midline mass and no mass. Bowel sounds are increased. There is no hepatosplenomegaly, splenomegaly or hepatomegaly. There is tenderness in the suprapubic area. There is no rigidity, no rebound, no guarding, no CVA tenderness, no tenderness at McBurney's point and negative Murphy's sign. No hernia. Hernia confirmed negative in the ventral area, confirmed negative in the right inguinal area and confirmed negative in the left inguinal area.  Musculoskeletal: She exhibits no edema and no tenderness.       Cervical back: She exhibits decreased range of motion, tenderness and bony tenderness. She exhibits no swelling, no edema, no deformity, no laceration, no pain, no spasm and normal pulse.  Lymphadenopathy:    She has no cervical adenopathy.  Neurological: She is alert. She has normal strength. She is not disoriented. She displays no atrophy, no tremor and normal reflexes. No cranial nerve deficit or sensory deficit. She exhibits normal muscle tone. She displays a negative Romberg sign. She displays no seizure activity. Coordination and gait normal. She displays no Babinski's sign on the right side. She displays no Babinski's sign on the left side.  Reflex Scores:      Tricep reflexes are 1+ on the right side and 1+ on the left side.      Bicep reflexes are 1+ on the right side and 1+ on the left side.      Brachioradialis reflexes are 1+ on the right side and 1+ on the left side.      Patellar reflexes are 1+ on the right side and 1+ on the left side.      Achilles reflexes are 1+ on the right side and 1+ on the left side. Skin: Skin is warm and dry. No rash noted. She is not diaphoretic. No erythema. No pallor.  Psychiatric: She has a normal mood and affect. Her behavior is  normal. Judgment and thought content normal.      Lab Results  Component Value Date   WBC 6.7 10/14/2009   HGB 13.3 10/14/2009   HCT 39.2 10/14/2009   PLT 198.0 10/14/2009   GLUCOSE 77 07/13/2010   CHOL 156 02/26/2009   TRIG 60.0 02/26/2009   HDL 45.20 02/26/2009   LDLCALC 99 02/26/2009   ALT 18 10/14/2009   AST 24 10/14/2009   NA 136 07/13/2010   K 4.0 07/13/2010   CL 101 07/13/2010   CREATININE 1.2 07/13/2010   BUN 25* 07/13/2010   CO2 28 07/13/2010   TSH 0.89 04/23/2010   INR 2.1 05/25/2011      Assessment & Plan:

## 2011-06-10 NOTE — Patient Instructions (Signed)
Abdominal Pain Abdominal pain can be caused by many things. Your caregiver decides the seriousness of your pain by an examination and possibly blood tests and X-rays. Many cases can be observed and treated at home. Most abdominal pain is not caused by a disease and will probably improve without treatment. However, in many cases, more time must pass before a clear cause of the pain can be found. Before that point, it may not be known if you need more testing, or if hospitalization or surgery is needed. HOME CARE INSTRUCTIONS   Do not take laxatives unless directed by your caregiver.   Take pain medicine only as directed by your caregiver.   Only take over-the-counter or prescription medicines for pain, discomfort, or fever as directed by your caregiver.   Try a clear liquid diet (broth, tea, or water) for as long as directed by your caregiver. Slowly move to a bland diet as tolerated.  SEEK IMMEDIATE MEDICAL CARE IF:   The pain does not go away.   You have a fever.   You keep throwing up (vomiting).   The pain is felt only in portions of the abdomen. Pain in the right side could possibly be appendicitis. In an adult, pain in the left lower portion of the abdomen could be colitis or diverticulitis.   You pass bloody or black tarry stools.  MAKE SURE YOU:   Understand these instructions.   Will watch your condition.   Will get help right away if you are not doing well or get worse.  Document Released: 11/04/2004 Document Revised: 01/14/2011 Document Reviewed: 09/13/2007 [facility] Patient Information 2012 [place], [place].Torticollis, Acute You have suddenly (acutely) developed a twisted neck (torticollis). This is usually a self-limited condition. CAUSES  Acute torticollis may be caused by malposition, trauma or infection. Most commonly, acute torticollis is caused by sleeping in an awkward position. Torticollis may also be caused by the flexion, extension or twisting of the neck muscles  beyond their normal position. Sometimes, the exact cause may not be known. SYMPTOMS  Usually, there is pain and limited movement of the neck. Your neck may twist to one side. DIAGNOSIS  The diagnosis is often made by physical examination. X-rays, CT scans or MRIs may be done if there is a history of trauma or concern of infection. TREATMENT  For a common, stiff neck that develops during sleep, treatment is focused on relaxing the contracted neck muscle. Medications (including shots) may be used to treat the problem. Most cases resolve in several days. Torticollis usually responds to conservative physical therapy. If left untreated, the shortened and spastic neck muscle can cause deformities in the face and neck. Rarely, surgery is required. HOME CARE INSTRUCTIONS   Use over-the-counter and prescription medications as directed by your caregiver.   Do stretching exercises and massage the neck as directed by your caregiver.   Follow up with physical therapy if needed and as directed by your caregiver.  SEEK IMMEDIATE MEDICAL CARE IF:   You develop difficulty breathing or noisy breathing (stridor).   You drool, develop trouble swallowing or have pain with swallowing.   You develop numbness or weakness in the hands or feet.   You have changes in speech or vision.   You have problems with urination or bowel movements.   You have difficulty walking.   You have a fever.   You have increased pain.  MAKE SURE YOU:   Understand these instructions.   Will watch your condition.   Will get  help right away if you are not doing well or get worse.  Document Released: 01/23/2000 Document Revised: 01/14/2011 Document Reviewed: 03/05/2009 [facility] Patient Information 2012 [place], [place].

## 2011-06-10 NOTE — Assessment & Plan Note (Signed)
I think that her s/s are c/w IBC-D, I do not see any evidence today of an acute abd process, I will check labs to see if she has any organic pathology

## 2011-06-11 ENCOUNTER — Telehealth: Payer: Self-pay

## 2011-06-11 NOTE — Telephone Encounter (Signed)
Patient called stating that she was seen by MD and had test done. SHe is requesting a personal call back from MD at [phone]. Thanks

## 2011-06-13 ENCOUNTER — Other Ambulatory Visit: Payer: Self-pay | Admitting: Internal Medicine

## 2011-06-13 DIAGNOSIS — M542 Cervicalgia: Secondary | ICD-10-CM

## 2011-06-13 MED ORDER — FENTANYL 25 MCG/HR TD PT72: 1.0000 | MEDICATED_PATCH | TRANSDERMAL | Status: DC

## 2011-06-21 ENCOUNTER — Telehealth: Payer: Self-pay

## 2011-06-21 NOTE — Telephone Encounter (Signed)
Patient called Alyssa [name] stating that she was called about setting up a bone density appt. Pt is not sure why this needs to be scheduled and doesn't think that she needs one.

## 2011-06-28 ENCOUNTER — Encounter: Payer: Self-pay | Admitting: Physical Medicine & Rehabilitation

## 2011-07-01 ENCOUNTER — Encounter: Payer: Self-pay | Admitting: Internal Medicine

## 2011-07-01 ENCOUNTER — Ambulatory Visit (INDEPENDENT_AMBULATORY_CARE_PROVIDER_SITE_OTHER): Payer: Medicare Other | Admitting: Internal Medicine

## 2011-07-01 VITALS — BP 131/79 | HR 82 | Ht 66.75 in | Wt 142.1 lb

## 2011-07-01 DIAGNOSIS — I4891 Unspecified atrial fibrillation: Secondary | ICD-10-CM

## 2011-07-01 DIAGNOSIS — Z950 Presence of cardiac pacemaker: Secondary | ICD-10-CM

## 2011-07-01 LAB — PACEMAKER DEVICE OBSERVATION
BRDY-0002RV: 60 {beats}/min
BRDY-0004RV: 130 {beats}/min
RV LEAD THRESHOLD: 0.625 V

## 2011-07-01 NOTE — Assessment & Plan Note (Signed)
permanent

## 2011-07-01 NOTE — Progress Notes (Signed)
  HPI  Alyssa [name] is a 78 y.o. female seen in followup for complete heart block. She is status post pacemaker implantation. She has atrial fibrillation which is permanent. She is taking and tolerating her Coumadin. There have been no intercurrent problems with exercise intolerance shortness of breath dyspnea or chest pain.      Past Medical History  Diagnosis Date  . Hypothyroidism   . Osteoporosis   . Renal insufficiency   . Thyroid cancer   . Hx of breast cancer   . Atrial fibrillation     pacemaker  . MVP (mitral valve prolapse)   . Left inguinal hernia     direct  . IBS (irritable bowel syndrome)   . GERD (gastroesophageal reflux disease)   . Hyperkalemia   . Internal hemorrhoids   . Diverticulosis     Past Surgical History  Procedure Date  . Thyroidectomy   . Mastectomy 1982    Bilateral  . Tonsillectomy   . Pacemaker insertion   . Upper gastrointestinal endoscopy 09/06/2000    normal  . Colonoscopy 10/28/2005    internal hemorrhoids, diverticulosis (same as in 2002 and random bxs negative then)    Current Outpatient Prescriptions  Medication Sig Dispense Refill  . acetaminophen (TYLENOL ARTHRITIS PAIN) 650 MG CR tablet Take 1,300 mg by mouth 2 (two) times daily.        . bifidobacterium infantis (ALIGN) capsule Take 1 capsule by mouth daily.        Marland Kitchen CALCIUM-VITAMIN D PO Take 1 tablet by mouth daily.        . Chlorpheniramine Maleate (ALLERGY PO) Take by mouth daily.        Marland Kitchen levothyroxine (SYNTHROID, LEVOTHROID) 88 MCG tablet Take 1 tablet (88 mcg total) by mouth daily.  90 tablet  3  . metoprolol (LOPRESSOR) 50 MG tablet TAKE ONE-HALF TABLET BY MOUTH IN THE MORNING AND ONE-HALF TABLET IN THE EVENING  90 tablet  1  . Multiple Vitamins-Minerals (CENTRUM SILVER) tablet Take 1 tablet by mouth daily.        Marland Kitchen warfarin (COUMADIN) 3 MG tablet TAKE 1 TABLET BY MOUTH ON MONDAY,THURSDAY,AND FRIDAY.TAKE 4 MG ON SATURDAY AND SUNDAY  30 tablet  11  . warfarin (COUMADIN) 4  MG tablet TAKE ONE TABLET BY MOUTH EVERY DAY AS DIRECTED  90 tablet  1    No Known Allergies  Review of Systems negative except from HPI and PMH  Physical Exam BP 131/79  Pulse 82  Ht 5' 6.75" (1.695 m)  Wt 142 lb 1.9 oz (64.465 kg)  BMI 22.43 kg/m2 Well developed and well nourished in no acute distress HENT normal E scleral and icterus clear Neck Supple Clear to ausculation Regular rate and rhythm, no murmurs gallops or rub Soft with active bowel sounds No clubbing cyanosis none Edema Alert and oriented, grossly normal motor and sensory function Skin Warm and Dry    Assessment and  Plan

## 2011-07-01 NOTE — Assessment & Plan Note (Signed)
The patient's device was interrogated.  The information was reviewed. No changes were made in the programming.    

## 2011-07-06 ENCOUNTER — Ambulatory Visit (INDEPENDENT_AMBULATORY_CARE_PROVIDER_SITE_OTHER): Payer: Medicare Other | Admitting: Pharmacist

## 2011-07-06 DIAGNOSIS — I4891 Unspecified atrial fibrillation: Secondary | ICD-10-CM

## 2011-07-06 LAB — POCT INR: INR: 2.9

## 2011-07-23 ENCOUNTER — Ambulatory Visit: Payer: Medicare Other | Admitting: Physical Medicine & Rehabilitation

## 2011-08-17 ENCOUNTER — Ambulatory Visit (INDEPENDENT_AMBULATORY_CARE_PROVIDER_SITE_OTHER): Payer: Medicare Other | Admitting: *Deleted

## 2011-08-17 DIAGNOSIS — I4891 Unspecified atrial fibrillation: Secondary | ICD-10-CM

## 2011-08-17 LAB — POCT INR: INR: 2.6

## 2011-08-31 ENCOUNTER — Other Ambulatory Visit: Payer: Self-pay | Admitting: Internal Medicine

## 2011-09-28 ENCOUNTER — Ambulatory Visit (INDEPENDENT_AMBULATORY_CARE_PROVIDER_SITE_OTHER): Payer: Medicare Other | Admitting: Pharmacist

## 2011-09-28 DIAGNOSIS — I4891 Unspecified atrial fibrillation: Secondary | ICD-10-CM

## 2011-10-04 ENCOUNTER — Ambulatory Visit (INDEPENDENT_AMBULATORY_CARE_PROVIDER_SITE_OTHER): Payer: Medicare Other | Admitting: Internal Medicine

## 2011-10-04 ENCOUNTER — Encounter: Payer: Self-pay | Admitting: Internal Medicine

## 2011-10-04 ENCOUNTER — Ambulatory Visit (INDEPENDENT_AMBULATORY_CARE_PROVIDER_SITE_OTHER)
Admission: RE | Admit: 2011-10-04 | Discharge: 2011-10-04 | Disposition: A | Discharge status: Home / Self Care | Payer: Medicare Other | Source: Ambulatory Visit | Attending: Internal Medicine | Admitting: Internal Medicine

## 2011-10-04 VITALS — BP 121/70 | HR 74 | Temp 97.7°F | Resp 16 | Wt 142.0 lb

## 2011-10-04 DIAGNOSIS — M542 Cervicalgia: Secondary | ICD-10-CM

## 2011-10-04 MED ORDER — TRAMADOL HCL 50 MG PO TABS: 50.0000 mg | ORAL_TABLET | Freq: Three times a day (TID) | ORAL | Status: AC | PRN

## 2011-10-04 NOTE — Patient Instructions (Signed)
Degenerative Disc Disease Degenerative disc disease is a condition caused by the changes that occur in the cushions of the backbone (spinal discs) as you grow older. Spinal discs are soft and compressible discs located between the bones of the spine (vertebrae). They act like shock absorbers. Degenerative disc disease can affect the wholespine. However, the neck and lower back are most commonly affected. Many changes can occur in the spinal discs with aging, such as:  The spinal discs may dry and shrink.   Small tears may occur in the tough, outer covering of the disc (annulus).   The disc space may become smaller due to loss of water.   Abnormal growths in the bone (spurs) may occur. This can put pressure on the nerve roots exiting the spinal canal, causing pain.   The spinal canal may become narrowed.  CAUSES  Degenerative disc disease is a condition caused by the changes that occur in the spinal discs with aging. The exact cause is not known, but there is a genetic basis for many patients. Degenerative changes can occur due to loss of fluid in the disc. This makes the disc thinner and reduces the space between the backbones. Small cracks can develop in the outer layer of the disc. This can lead to the breakdown of the disc. You are more likely to get degenerative disc disease if you are overweight. Smoking cigarettes and doing heavy work such as weightlifting can also increase your risk of this condition. Degenerative changes can start after a sudden injury. Growth of bone spurs can compress the nerve roots and cause pain.  SYMPTOMS  The symptoms vary from person to person. Some people may have no pain, while others have severe pain. The pain may be so severe that it can limit your activities. The location of the pain depends on the part of your backbone that is affected. You will have neck or arm pain if a disc in the neck area is affected. You will have pain in your back, buttocks, or legs if a  disc in the lower back is affected. The pain becomes worse while bending, reaching up, or with twisting movements. The pain may start gradually and then get worse as time passes. It may also start after a major or minor injury. You may feel numbness or tingling in the arms or legs.  DIAGNOSIS  Your caregiver will ask you about your symptoms and about activities or habits that may cause the pain. He or she may also ask about any injuries, diseases, ortreatments you have had earlier. Your caregiver will examine you to check for the range of movement that is possible in the affected area, to check for strength in your extremities, and to check for sensation in the areas of the arms and legs supplied by different nerve roots. An X-ray of the spine may be taken. Your caregiver may suggest other imaging tests, such as a computerized magnetic scan (MRI), if needed.  TREATMENT  Treatment includes rest, modifying your activities, and applying ice and heat. Your caregiver may prescribe medicines to reduce your pain and may ask you to do some exercises to strengthen your back. In some cases, you may need surgery. You and your caregiver will decide on the treatment that is best for you. HOME CARE INSTRUCTIONS   Follow proper lifting and walking techniques as advised by your caregiver.   Maintain good posture.   Exercise regularly as advised.   Perform relaxation exercises.   Change your sitting,   standing, and sleeping habits as advised. Change positions frequently.   Lose weight as advised.   Stop smoking if you smoke.   Wear supportive footwear.  SEEK MEDICAL CARE IF:  The pain does not go away within 1 to 4 weeks. SEEK IMMEDIATE MEDICAL CARE IF:   The pain is severe.   You notice weakness in your arms, hands, or legs.   You begin to lose control of your bladder or bowel.  MAKE SURE YOU:   Understand these instructions.   Will watch your condition.   Will get help right away if you are not  doing well or get worse.  Document Released: 11/22/2006 Document Revised: 01/14/2011 Document Reviewed: 11/22/2006 [employer] Patient Information 2012 [employer]. 

## 2011-10-04 NOTE — Assessment & Plan Note (Signed)
I will check plain films to see how severe the DDD, arthritis is, she will try tramadol for pain relief

## 2011-10-04 NOTE — Progress Notes (Signed)
Subjective:    Patient ID: Alyssa [name], female    DOB: 07/14/1924, 78 y.o.   MRN: [id number]  Neck Pain  This is a recurrent problem. The current episode started more than 1 year ago. The problem occurs intermittently. The problem has been gradually worsening. The pain is associated with nothing. The pain is present in the left side and midline. The quality of the pain is described as stabbing and shooting. The pain is at a severity of 4/10. The pain is moderate. The symptoms are aggravated by twisting. The pain is same all the time. Stiffness is present at night. Pertinent negatives include no chest pain, fever, headaches, leg pain, numbness, pain with swallowing, paresis, photophobia, syncope, tingling, trouble swallowing, visual change, weakness or weight loss. She has tried acetaminophen for the symptoms. The treatment provided mild relief.      Review of Systems  Constitutional: Negative for fever, chills, weight loss, diaphoresis, activity change, appetite change, fatigue and unexpected weight change.  HENT: Positive for neck pain and neck stiffness. Negative for sore throat, facial swelling, trouble swallowing and voice change.   Eyes: Negative.  Negative for photophobia.  Respiratory: Negative for cough, chest tightness, shortness of breath, wheezing and stridor.   Cardiovascular: Negative for chest pain, palpitations, leg swelling and syncope.  Gastrointestinal: Negative.   Genitourinary: Negative.   Musculoskeletal: Negative for myalgias, back pain, joint swelling, arthralgias and gait problem.  Skin: Negative for color change, pallor, rash and wound.  Neurological: Negative for dizziness, tingling, tremors, seizures, syncope, facial asymmetry, speech difficulty, weakness, light-headedness, numbness and headaches.  Hematological: Negative for adenopathy. Does not bruise/bleed easily.  Psychiatric/Behavioral: Negative.        Objective:   Physical Exam  Vitals  reviewed. Constitutional: She is oriented to person, place, and time. She appears well-developed and well-nourished. No distress.  HENT:  Head: Normocephalic and atraumatic.  Mouth/Throat: Oropharynx is clear and moist. No oropharyngeal exudate.  Eyes: Conjunctivae are normal. Right eye exhibits no discharge. Left eye exhibits no discharge. No scleral icterus.  Neck: Normal range of motion. Neck supple. No JVD present. No tracheal deviation present. No thyromegaly present.  Cardiovascular: Normal rate, regular rhythm, normal heart sounds and intact distal pulses.  Exam reveals no gallop and no friction rub.   No murmur heard. Pulmonary/Chest: Effort normal and breath sounds normal. No stridor. No respiratory distress. She has no wheezes. She has no rales. She exhibits no tenderness.  Abdominal: Soft. Bowel sounds are normal. She exhibits no distension and no mass. There is no tenderness. There is no rebound and no guarding.  Musculoskeletal: Normal range of motion. She exhibits no edema and no tenderness.       Cervical back: She exhibits tenderness. She exhibits normal range of motion, no bony tenderness, no swelling, no edema, no deformity, no laceration, no pain, no spasm and normal pulse.  Lymphadenopathy:    She has no cervical adenopathy.  Neurological: She is oriented to person, place, and time. She has normal reflexes. She displays normal reflexes. No cranial nerve deficit. She exhibits normal muscle tone. Coordination normal.  Skin: Skin is warm and dry. No rash noted. She is not diaphoretic. No erythema. No pallor.  Psychiatric: She has a normal mood and affect. Her behavior is normal. Judgment and thought content normal.      Lab Results  Component Value Date   WBC 9.7 06/10/2011   HGB 13.8 06/10/2011   HCT 42.1 06/10/2011   PLT 188.0 06/10/2011  GLUCOSE 103* 06/10/2011   CHOL 136 06/10/2011   TRIG 79.0 06/10/2011   HDL 56.60 06/10/2011   LDLCALC 64 06/10/2011   ALT 25 06/10/2011   AST 24  06/10/2011   NA 140 06/10/2011   K 4.5 06/10/2011   CL 102 06/10/2011   CREATININE 1.1 06/10/2011   BUN 28* 06/10/2011   CO2 26 06/10/2011   TSH 1.53 06/10/2011   INR 3.8 09/28/2011      Assessment & Plan:

## 2011-10-05 ENCOUNTER — Encounter: Payer: Self-pay | Admitting: Internal Medicine

## 2011-10-13 ENCOUNTER — Encounter: Payer: Self-pay | Admitting: Pharmacist

## 2011-10-26 ENCOUNTER — Ambulatory Visit (INDEPENDENT_AMBULATORY_CARE_PROVIDER_SITE_OTHER): Payer: Medicare Other

## 2011-10-26 DIAGNOSIS — I4891 Unspecified atrial fibrillation: Secondary | ICD-10-CM

## 2011-11-03 ENCOUNTER — Encounter: Payer: Self-pay | Admitting: Internal Medicine

## 2011-11-08 ENCOUNTER — Telehealth: Payer: Self-pay | Admitting: Internal Medicine

## 2011-11-08 NOTE — Telephone Encounter (Signed)
Pt does not want colon due to age

## 2011-11-11 ENCOUNTER — Other Ambulatory Visit: Payer: Self-pay | Admitting: Dermatology

## 2011-11-19 ENCOUNTER — Ambulatory Visit (INDEPENDENT_AMBULATORY_CARE_PROVIDER_SITE_OTHER): Payer: Medicare Other | Admitting: Internal Medicine

## 2011-11-19 ENCOUNTER — Encounter: Payer: Self-pay | Admitting: Internal Medicine

## 2011-11-19 VITALS — BP 132/78 | HR 80 | Temp 96.2°F | Ht 66.75 in | Wt 144.0 lb

## 2011-11-19 DIAGNOSIS — K5790 Diverticulosis of intestine, part unspecified, without perforation or abscess without bleeding: Secondary | ICD-10-CM

## 2011-11-19 DIAGNOSIS — R197 Diarrhea, unspecified: Secondary | ICD-10-CM

## 2011-11-19 DIAGNOSIS — H6980 Other specified disorders of Eustachian tube, unspecified ear: Secondary | ICD-10-CM

## 2011-11-19 DIAGNOSIS — J309 Allergic rhinitis, unspecified: Secondary | ICD-10-CM

## 2011-11-19 DIAGNOSIS — K5730 Diverticulosis of large intestine without perforation or abscess without bleeding: Secondary | ICD-10-CM

## 2011-11-19 MED ORDER — DIPHENOXYLATE-ATROPINE 2.5-0.025 MG PO TABS: 1.0000 | ORAL_TABLET | Freq: Four times a day (QID) | ORAL | Status: DC | PRN

## 2011-11-19 MED ORDER — LORATADINE 10 MG PO TABS: 5.0000 mg | ORAL_TABLET | Freq: Every day | ORAL | Status: DC

## 2011-11-19 NOTE — Progress Notes (Signed)
  Subjective:    Patient ID: Alyssa [name], female    DOB: 06-06-1924, 78 y.o.   MRN: [id number]  HPI  Complains of loose stool, "diarrhea" Chronic symptoms of same related to diverticulosis, history of diverticulitis Current symptoms ongoing x48 hours, unrelieved with single loperamide over-the-counter tablet Denies travel, fever, melena, bright red blood in stool Not associated with abdominal pain, nausea, vomiting or back pain  Also complains of allergy symptoms  Past Medical History  Diagnosis Date  . Hypothyroidism   . Osteoporosis   . Renal insufficiency   . Thyroid cancer   . Hx of breast cancer   . Atrial fibrillation     pacemaker, chronic anticoag  . MVP (mitral valve prolapse)   . Left inguinal hernia     direct  . IBS (irritable bowel syndrome)   . GERD (gastroesophageal reflux disease)   . Hyperkalemia   . Internal hemorrhoids   . Diverticulosis     Review of Systems See above     Objective:   Physical Exam BP 132/78  Pulse 80  Temp 96.2 F (35.7 C) (Oral)  Ht 5' 6.75" (1.695 m)  Wt 144 lb (65.318 kg)  BMI 22.72 kg/m2  SpO2 94% Wt Readings from Last 3 Encounters:  11/19/11 144 lb (65.318 kg)  10/04/11 142 lb (64.411 kg)  07/01/11 142 lb 1.9 oz (64.465 kg)   Constitutional:  she appears well-developed and well-nourished. No distress. Nontoxic HEENT: Sinuses nontender, oropharynx with clear postnasal drip. Tympanic membranes with clear effusion left greater than right with hearing aids removed. No erythema -naris with scant clear discharge and mucosal pallor Neck: Normal range of motion. Neck supple. No JVD present. No thyromegaly present.  Cardiovascular: Normal rate, irregular rhythm and normal heart sounds.  No murmur heard. no BLE edema Pulmonary/Chest: Effort normal and breath sounds normal. No respiratory distress. no wheezes.  Abdominal: Soft. Bowel sounds are normal. Patient exhibits no distension. There is no tenderness.  Neurological: she is  alert and oriented to person, place, and time. No cranial nerve deficit. Coordination normal.  Skin: Skin is warm and dry.  No erythema or ulceration.  Psychiatric: she has a normal mood and affect. behavior is normal. Judgment and thought content normal.   Lab Results  Component Value Date   WBC 9.7 06/10/2011   HGB 13.8 06/10/2011   HCT 42.1 06/10/2011   PLT 188.0 06/10/2011   GLUCOSE 103* 06/10/2011   CHOL 136 06/10/2011   TRIG 79.0 06/10/2011   HDL 56.60 06/10/2011   LDLCALC 64 06/10/2011   ALT 25 06/10/2011   AST 24 06/10/2011   NA 140 06/10/2011   K 4.5 06/10/2011   CL 102 06/10/2011   CREATININE 1.1 06/10/2011   BUN 28* 06/10/2011   CO2 26 06/10/2011   TSH 1.53 06/10/2011   INR 2.9 10/26/2011        Assessment & Plan:   Diarrhea, acute on chronic symptoms - no red flags on history or exam, afebrile, no blood or melena. No abdominal tenderness on exam -will change loperamide to Lomotil as needed, patient agrees to call symptoms worse or unimproved to reconsider need for antibiotics -new prescription provided today  Allergic rhinitis with eustachian tube dysfunction -recommended Claritin over Zyrtec for antihistamine effect without as much sedation  Diverticulosis history of diverticulitis -reviewed signs and symptoms of same today, education provided at length including diet of favorable foods and foods to avoid

## 2011-11-19 NOTE — Patient Instructions (Addendum)
It was good to see you today. Use prescription Lomotil for diarrhea as needed in place of over-the-counter loperamide antidiarrheal Use generic Claritin in place of generic Zyrtec for allergy and eustachian tube symptoms Your prescription(s) have been submitted to your pharmacy. Please take as directed and contact our office if you believe you are having problem(s) with the medication(s). If you develop worsening diarrhea symptoms, abdominal pain or fever, call and we can reconsider antibiotics, but it does not appear necessary to use antibiotics at this time. Diverticulitis A diverticulum is a small pouch or sac on the colon. Diverticulosis is the presence of these diverticula on the colon. Diverticulitis is the irritation (inflammation) or infection of diverticula. CAUSES   The colon and its diverticula contain bacteria. If food particles block the tiny opening to a diverticulum, the bacteria inside can grow and cause an increase in pressure. This leads to infection and inflammation and is called diverticulitis. SYMPTOMS    Abdominal pain and tenderness. Usually, the pain is located on the left side of your abdomen. However, it could be located elsewhere.   Fever.   Bloating.   Feeling sick to your stomach (nausea).   Throwing up (vomiting).   Abnormal stools.  DIAGNOSIS   Your caregiver will take a history and perform a physical exam. Since many things can cause abdominal pain, other tests may be necessary. Tests may include:  Blood tests.   Urine tests.   X-ray of the abdomen.   CT scan of the abdomen.  Sometimes, surgery is needed to determine if diverticulitis or other conditions are causing your symptoms. TREATMENT   Most of the time, you can be treated without surgery. Treatment includes:  Resting the bowels by only having liquids for a few days. As you improve, you will need to eat a low-fiber diet.   Intravenous (IV) fluids if you are losing body fluids (dehydrated).     Antibiotic medicines that treat infections may be given.   Pain and nausea medicine, if needed.   Surgery if the inflamed diverticulum has burst.  HOME CARE INSTRUCTIONS    Try a clear liquid diet (broth, tea, or water for as long as directed by your caregiver). You may then gradually begin a low-fiber diet as tolerated. A low-fiber diet is a diet with less than 10 grams of fiber. Choose the foods below to reduce fiber in the diet:   White breads, cereals, rice, and pasta.   Cooked fruits and vegetables or soft fresh fruits and vegetables without the skin.   Ground or well-cooked tender beef, ham, veal, lamb, pork, or poultry.   Eggs and seafood.   After your diverticulitis symptoms have improved, your caregiver may put you on a high-fiber diet. A high-fiber diet includes 14 grams of fiber for every 1000 calories consumed. For a standard 2000 calorie diet, you would need 28 grams of fiber. Follow these diet guidelines to help you increase the fiber in your diet. It is important to slowly increase the amount fiber in your diet to avoid gas, constipation, and bloating.   Choose whole-grain breads, cereals, pasta, and brown rice.   Choose fresh fruits and vegetables with the skin on. Do not overcook vegetables because the more vegetables are cooked, the more fiber is lost.   Choose more nuts, seeds, legumes, dried peas, beans, and lentils.   Look for food products that have greater than 3 grams of fiber per serving on the Nutrition Facts label.   Take all medicine  as directed by your caregiver.   If your caregiver has given you a follow-up appointment, it is very important that you go. Not going could result in lasting (chronic) or permanent injury, pain, and disability. If there is any problem keeping the appointment, call to reschedule.  SEEK MEDICAL CARE IF:    Your pain does not improve.   You have a hard time advancing your diet beyond clear liquids.   Your bowel movements  do not return to normal.  SEEK IMMEDIATE MEDICAL CARE IF:    Your pain becomes worse.   You have an oral temperature above 102 F (38.9 C), not controlled by medicine.   You have repeated vomiting.   You have bloody or black, tarry stools.   Symptoms that brought you to your caregiver become worse or are not getting better.  MAKE SURE YOU:    Understand these instructions.   Will watch your condition.   Will get help right away if you are not doing well or get worse.  Document Released: 11/04/2004 Document Revised: 04/19/2011 Document Reviewed: 03/02/2010 [facility] Patient Information 2013 [place], [place].

## 2011-11-20 ENCOUNTER — Other Ambulatory Visit: Payer: Self-pay | Admitting: Internal Medicine

## 2011-11-23 ENCOUNTER — Ambulatory Visit (INDEPENDENT_AMBULATORY_CARE_PROVIDER_SITE_OTHER): Payer: Medicare Other | Admitting: *Deleted

## 2011-11-23 DIAGNOSIS — I4891 Unspecified atrial fibrillation: Secondary | ICD-10-CM

## 2011-11-23 LAB — POCT INR: INR: 2.6

## 2011-11-30 ENCOUNTER — Other Ambulatory Visit: Payer: Self-pay | Admitting: Dermatology

## 2011-12-21 ENCOUNTER — Ambulatory Visit (INDEPENDENT_AMBULATORY_CARE_PROVIDER_SITE_OTHER): Payer: Medicare Other | Admitting: Cardiology

## 2011-12-21 ENCOUNTER — Encounter: Payer: Self-pay | Admitting: Internal Medicine

## 2011-12-21 ENCOUNTER — Ambulatory Visit (INDEPENDENT_AMBULATORY_CARE_PROVIDER_SITE_OTHER): Payer: Medicare Other | Admitting: *Deleted

## 2011-12-21 ENCOUNTER — Encounter: Payer: Self-pay | Admitting: Cardiology

## 2011-12-21 VITALS — BP 124/70 | HR 67 | Ht 66.75 in | Wt 144.2 lb

## 2011-12-21 DIAGNOSIS — Z950 Presence of cardiac pacemaker: Secondary | ICD-10-CM

## 2011-12-21 DIAGNOSIS — I4891 Unspecified atrial fibrillation: Secondary | ICD-10-CM

## 2011-12-21 LAB — PACEMAKER DEVICE OBSERVATION
BRDY-0002RV: 60 {beats}/min
BRDY-0004RV: 130 {beats}/min
DEVICE MODEL PM: 222749

## 2011-12-21 NOTE — Patient Instructions (Signed)
Your physician wants you to follow-up in: 6 months with Dr. Klein. You will receive a reminder letter in the mail two months in advance. If you don't receive a letter, please call our office to schedule the follow-up appointment.  

## 2011-12-21 NOTE — Progress Notes (Signed)
Pacemaker check in clinic.  Normal device function.  See PaceArt for details.

## 2011-12-23 ENCOUNTER — Ambulatory Visit (INDEPENDENT_AMBULATORY_CARE_PROVIDER_SITE_OTHER): Payer: Medicare Other | Admitting: *Deleted

## 2011-12-23 DIAGNOSIS — Z23 Encounter for immunization: Secondary | ICD-10-CM

## 2012-01-17 ENCOUNTER — Encounter: Payer: Self-pay | Admitting: *Deleted

## 2012-01-28 ENCOUNTER — Ambulatory Visit (INDEPENDENT_AMBULATORY_CARE_PROVIDER_SITE_OTHER): Payer: Medicare Other | Admitting: *Deleted

## 2012-01-28 DIAGNOSIS — I4891 Unspecified atrial fibrillation: Secondary | ICD-10-CM

## 2012-02-18 ENCOUNTER — Ambulatory Visit (INDEPENDENT_AMBULATORY_CARE_PROVIDER_SITE_OTHER): Payer: Medicare Other | Admitting: *Deleted

## 2012-02-18 DIAGNOSIS — I4891 Unspecified atrial fibrillation: Secondary | ICD-10-CM

## 2012-02-20 ENCOUNTER — Other Ambulatory Visit: Payer: Self-pay | Admitting: Internal Medicine

## 2012-03-17 ENCOUNTER — Ambulatory Visit (INDEPENDENT_AMBULATORY_CARE_PROVIDER_SITE_OTHER): Payer: Medicare Other | Admitting: *Deleted

## 2012-03-17 DIAGNOSIS — I4891 Unspecified atrial fibrillation: Secondary | ICD-10-CM

## 2012-03-17 LAB — POCT INR: INR: 2.7

## 2012-04-20 ENCOUNTER — Ambulatory Visit (INDEPENDENT_AMBULATORY_CARE_PROVIDER_SITE_OTHER): Payer: Medicare Other | Admitting: *Deleted

## 2012-04-20 DIAGNOSIS — I4891 Unspecified atrial fibrillation: Secondary | ICD-10-CM

## 2012-04-20 LAB — POCT INR: INR: 3.2

## 2012-05-04 ENCOUNTER — Ambulatory Visit (INDEPENDENT_AMBULATORY_CARE_PROVIDER_SITE_OTHER): Payer: Medicare Other

## 2012-05-04 DIAGNOSIS — I4891 Unspecified atrial fibrillation: Secondary | ICD-10-CM

## 2012-05-04 LAB — POCT INR: INR: 2.7

## 2012-05-19 ENCOUNTER — Other Ambulatory Visit: Payer: Self-pay | Admitting: Internal Medicine

## 2012-05-28 ENCOUNTER — Other Ambulatory Visit: Payer: Self-pay | Admitting: Internal Medicine

## 2012-05-29 ENCOUNTER — Telehealth: Payer: Self-pay | Admitting: Internal Medicine

## 2012-05-29 ENCOUNTER — Other Ambulatory Visit: Payer: Self-pay | Admitting: Internal Medicine

## 2012-05-29 NOTE — Telephone Encounter (Signed)
The patient called and is hoping to get her synthorid refilled.  She has made a follow up apt for the beg of May.

## 2012-05-30 MED ORDER — LEVOTHYROXINE SODIUM 88 MCG PO TABS: 88.0000 ug | ORAL_TABLET | Freq: Every day | ORAL | Status: DC

## 2012-05-30 NOTE — Telephone Encounter (Signed)
Synthroid electronically routed to the [facility] on [address]

## 2012-06-05 ENCOUNTER — Ambulatory Visit (INDEPENDENT_AMBULATORY_CARE_PROVIDER_SITE_OTHER): Payer: Medicare Other | Admitting: *Deleted

## 2012-06-05 DIAGNOSIS — I4891 Unspecified atrial fibrillation: Secondary | ICD-10-CM

## 2012-06-05 LAB — POCT INR: INR: 3.9

## 2012-06-09 ENCOUNTER — Ambulatory Visit (INDEPENDENT_AMBULATORY_CARE_PROVIDER_SITE_OTHER): Payer: Medicare Other | Admitting: Internal Medicine

## 2012-06-09 ENCOUNTER — Other Ambulatory Visit (INDEPENDENT_AMBULATORY_CARE_PROVIDER_SITE_OTHER): Payer: Medicare Other

## 2012-06-09 ENCOUNTER — Encounter: Payer: Self-pay | Admitting: Internal Medicine

## 2012-06-09 VITALS — BP 114/70 | HR 76 | Temp 97.6°F | Resp 16 | Wt 142.0 lb

## 2012-06-09 DIAGNOSIS — I4891 Unspecified atrial fibrillation: Secondary | ICD-10-CM

## 2012-06-09 DIAGNOSIS — N183 Chronic kidney disease, stage 3 unspecified: Secondary | ICD-10-CM

## 2012-06-09 DIAGNOSIS — E039 Hypothyroidism, unspecified: Secondary | ICD-10-CM

## 2012-06-09 DIAGNOSIS — R7309 Other abnormal glucose: Secondary | ICD-10-CM

## 2012-06-09 LAB — HM DEXA SCAN

## 2012-06-09 LAB — TSH: TSH: 1.01 u[IU]/mL (ref 0.35–5.50)

## 2012-06-09 MED ORDER — RIVAROXABAN 15 MG PO TABS: 15.0000 mg | ORAL_TABLET | Freq: Every day | ORAL | Status: DC

## 2012-06-09 NOTE — Progress Notes (Signed)
  Subjective:    Patient ID: Alyssa [name], female    DOB: [birth year], 79 y.o.   MRN: [id number]  Thyroid Problem Presents for follow-up visit. Patient reports no anxiety, cold intolerance, constipation, depressed mood, diaphoresis, diarrhea, dry skin, fatigue, hair loss, heat intolerance, hoarse voice, leg swelling, nail problem, palpitations, tremors, visual change, weight gain or weight loss. The symptoms have been stable.      Review of Systems  Constitutional: Negative.  Negative for fever, chills, weight loss, weight gain, diaphoresis, activity change, appetite change, fatigue and unexpected weight change.  HENT: Negative.  Negative for hoarse voice.   Eyes: Negative.   Respiratory: Negative.  Negative for cough, chest tightness, shortness of breath, wheezing and stridor.   Cardiovascular: Negative.  Negative for chest pain, palpitations and leg swelling.  Gastrointestinal: Negative.  Negative for nausea, vomiting, diarrhea, constipation, blood in stool and anal bleeding.  Endocrine: Negative.  Negative for cold intolerance and heat intolerance.  Genitourinary: Negative.  Negative for hematuria and vaginal bleeding.  Musculoskeletal: Negative.   Skin: Negative.   Allergic/Immunologic: Negative.   Neurological: Negative.  Negative for dizziness, tremors, syncope and light-headedness.  Hematological: Negative.  Negative for adenopathy. Does not bruise/bleed easily.  Psychiatric/Behavioral: Negative.        Objective:   Physical Exam  Vitals reviewed. Constitutional: She is oriented to person, place, and time. She appears well-developed and well-nourished. No distress.  HENT:  Head: Normocephalic and atraumatic.  Mouth/Throat: Oropharynx is clear and moist. No oropharyngeal exudate.  Eyes: Conjunctivae are normal. Right eye exhibits no discharge. Left eye exhibits no discharge. No scleral icterus.  Neck: Normal range of motion. Neck supple. No JVD present. No tracheal deviation  present. No thyromegaly present.  Cardiovascular: Normal rate, regular rhythm, normal heart sounds and intact distal pulses.  Exam reveals no gallop and no friction rub.   No murmur heard. Pulmonary/Chest: Effort normal and breath sounds normal. No stridor. No respiratory distress. She has no wheezes. She has no rales. She exhibits no tenderness.  Abdominal: Soft. Bowel sounds are normal. She exhibits no distension and no mass. There is no tenderness. There is no rebound and no guarding.  Musculoskeletal: Normal range of motion. She exhibits no edema and no tenderness.  Lymphadenopathy:    She has no cervical adenopathy.  Neurological: She is oriented to person, place, and time.  Skin: Skin is warm and dry. No rash noted. She is not diaphoretic. No erythema. No pallor.  Psychiatric: She has a normal mood and affect. Her behavior is normal. Judgment and thought content normal.     Lab Results  Component Value Date   WBC 9.7 06/10/2011   HGB 13.8 06/10/2011   HCT 42.1 06/10/2011   PLT 188.0 06/10/2011   GLUCOSE 103* 06/10/2011   CHOL 136 06/10/2011   TRIG 79.0 06/10/2011   HDL 56.60 06/10/2011   LDLCALC 64 06/10/2011   ALT 25 06/10/2011   AST 24 06/10/2011   NA 140 06/10/2011   K 4.5 06/10/2011   CL 102 06/10/2011   CREATININE 1.1 06/10/2011   BUN 28* 06/10/2011   CO2 26 06/10/2011   TSH 1.53 06/10/2011   INR 3.9 06/05/2012       Assessment & Plan:

## 2012-06-09 NOTE — Assessment & Plan Note (Signed)
She has good rate and rhythm control She is fed up with keeping up with coumadin management and wants to try one of the newer anti-coag agents I have recommended a renally adjusted xarelto dose

## 2012-06-09 NOTE — Assessment & Plan Note (Signed)
I will check her A1C to see if she has developed DM2 

## 2012-06-09 NOTE — Assessment & Plan Note (Signed)
I will check her TSh level today and will address her dose if needed

## 2012-06-09 NOTE — Patient Instructions (Signed)

## 2012-06-12 ENCOUNTER — Encounter: Payer: Self-pay | Admitting: Internal Medicine

## 2012-06-12 DIAGNOSIS — N183 Chronic kidney disease, stage 3 unspecified: Secondary | ICD-10-CM | POA: Insufficient documentation

## 2012-06-12 DIAGNOSIS — N1832 Chronic kidney disease, stage 3b: Secondary | ICD-10-CM | POA: Insufficient documentation

## 2012-06-12 LAB — COMPREHENSIVE METABOLIC PANEL
ALT: 18 U/L (ref 0–35)
Albumin: 4.5 g/dL (ref 3.5–5.2)
CO2: 30 mEq/L (ref 19–32)
GFR: 39.17 mL/min — ABNORMAL LOW (ref >60.00)
Glucose, Bld: 117 mg/dL — ABNORMAL HIGH (ref 70–99)
Potassium: 4.4 mEq/L (ref 3.5–5.1)
Sodium: 138 mEq/L (ref 135–145)
Total Bilirubin: 0.6 mg/dL (ref 0.3–1.2)
Total Protein: 7.1 g/dL (ref 6.0–8.3)

## 2012-06-22 ENCOUNTER — Encounter: Payer: Self-pay | Admitting: Internal Medicine

## 2012-06-22 ENCOUNTER — Ambulatory Visit (INDEPENDENT_AMBULATORY_CARE_PROVIDER_SITE_OTHER): Payer: Medicare Other | Admitting: Internal Medicine

## 2012-06-22 VITALS — BP 134/79 | HR 77 | Ht 66.0 in | Wt 144.0 lb

## 2012-06-22 DIAGNOSIS — I4891 Unspecified atrial fibrillation: Secondary | ICD-10-CM

## 2012-06-22 DIAGNOSIS — Z950 Presence of cardiac pacemaker: Secondary | ICD-10-CM

## 2012-06-22 LAB — PACEMAKER DEVICE OBSERVATION
BATTERY VOLTAGE: 2.73 V
BMOD-0001RV: LOW
BMOD-0005RV: 95 {beats}/min
BRDY-0002RV: 60 {beats}/min
DEVICE MODEL PM: 222749
RV LEAD IMPEDENCE PM: 539 Ohm
VENTRICULAR PACING PM: 100

## 2012-06-22 NOTE — Progress Notes (Signed)
Patient Care Team: [name], MD as PCP - General Duke Salvia, MD (Cardiology)   HPI  Alyssa [name] is a 79 y.o. female seen in followup for complete heart block. She is status post pacemaker implantation. She has atrial fibrillation which is permanent. She is taking and tolerating her Coumadin. There have been no intercurrent problems with exercise intolerance shortness of breath dyspnea or chest pain.   Past Medical History  Diagnosis Date  . Hypothyroidism   . Osteoporosis   . Renal insufficiency   . Thyroid cancer   . Hx of breast cancer   . Atrial fibrillation     pacemaker, chronic anticoag  . MVP (mitral valve prolapse)   . Left inguinal hernia     direct  . IBS (irritable bowel syndrome)   . GERD (gastroesophageal reflux disease)   . Hyperkalemia   . Internal hemorrhoids   . Diverticulosis     Past Surgical History  Procedure Laterality Date  . Thyroidectomy    . Mastectomy  1982    Bilateral  . Tonsillectomy    . Pacemaker insertion    . Upper gastrointestinal endoscopy  09/06/2000    normal  . Colonoscopy  10/28/2005    internal hemorrhoids, diverticulosis (same as in 2002 and random bxs negative then)    Current Outpatient Prescriptions  Medication Sig Dispense Refill  . acetaminophen (TYLENOL ARTHRITIS PAIN) 650 MG CR tablet Take 1,300 mg by mouth 2 (two) times daily.        . bifidobacterium infantis (ALIGN) capsule Take 1 capsule by mouth daily.        Marland Kitchen CALCIUM-VITAMIN D PO Take 2 tablets by mouth daily.       Marland Kitchen levothyroxine (SYNTHROID, LEVOTHROID) 88 MCG tablet Take 1 tablet (88 mcg total) by mouth daily.  30 tablet  1  . loratadine (CLARITIN) 10 MG tablet Take 0.5-1 tablets (5-10 mg total) by mouth daily.  30 tablet  11  . metoprolol (LOPRESSOR) 50 MG tablet TAKE ONE-HALF TABLET BY MOUTH IN THE MORNING AND TAKE ONE-HALF TABLET IN THE EVENING  90 tablet  0  . Rivaroxaban (XARELTO) 15 MG TABS tablet Take 1 tablet (15 mg total) by mouth daily.   90 tablet  3   No current facility-administered medications for this visit.    No Known Allergies  Review of Systems negative except from HPI and PMH  Physical Exam BP 134/79  Pulse 77  Ht 5\' 6"  (1.676 m)  Wt 144 lb (65.318 kg)  BMI 23.25 kg/m2 Well developed and well nourished in no acute distress HENT normal E scleral and icterus clear Neck Supple JVP flat; carotids brisk and full Clear to ausculation  Regular rate and rhythm, no murmurs gallops or rub Soft with active bowel sounds No clubbing cyanosis nonea a a a a a a a Edema Alert and oriented, grossly normal motor and sensory function Skin Warm and Dry    Assessment and  Plan

## 2012-06-22 NOTE — Assessment & Plan Note (Signed)
The patient's device was interrogated.  The information was reviewed. No changes were made in the programming.    

## 2012-06-22 NOTE — Patient Instructions (Signed)
Your physician wants you to follow-up in: 6 months with the device clinic & 1 year with Dr. Graciela Husbands. You will receive a reminder letter in the mail two months in advance. If you don't receive a letter, please call our office to schedule the follow-up appointment.  Your physician recommends that you continue on your current medications as directed. Please refer to the Current Medication list given to you today.  Follow up with the coumadin clinic the beginning of June: Thursday 07/20/12 at 2:00 pm

## 2012-06-22 NOTE — Assessment & Plan Note (Signed)
Permanent on anticoagulatoin in a in a

## 2012-07-20 ENCOUNTER — Ambulatory Visit (INDEPENDENT_AMBULATORY_CARE_PROVIDER_SITE_OTHER): Payer: Medicare Other | Admitting: *Deleted

## 2012-07-20 DIAGNOSIS — I4891 Unspecified atrial fibrillation: Secondary | ICD-10-CM

## 2012-07-20 LAB — BASIC METABOLIC PANEL
BUN: 33 mg/dL — ABNORMAL HIGH (ref 6–23)
Chloride: 106 mEq/L (ref 96–112)
GFR: 45.69 mL/min — ABNORMAL LOW (ref >60.00)
Potassium: 4.3 mEq/L (ref 3.5–5.1)
Sodium: 138 mEq/L (ref 135–145)

## 2012-07-20 LAB — CBC
MCHC: 33.2 g/dL (ref 30.0–36.0)
MCV: 93.9 fl (ref 78.0–100.0)
Platelets: 188 10*3/uL (ref 150.0–400.0)
RDW: 13.3 % (ref 11.5–14.6)

## 2012-07-20 NOTE — Progress Notes (Signed)
Pt was started on Xarelto 15mg  daily  for Atrial Fib on May 2nd, 2014.    Reviewed patients medication list.  Pt is not  currently on any combined P-gp and strong CYP3A4 inhibitors/inducers (ketoconazole, traconazole, ritonavir, carbamazepine, phenytoin, rifampin, St. John's wort).  Reviewed labs.  SCr 1.2, Weight  65.36kg, CrCl- 34.72.  Dose is appropriate based on CrCl.   Hgb and HCT 13.1/39.5 WNL.  A full discussion of the nature of anticoagulants has been carried out.  A benefit/risk analysis has been presented to the patient, so that they understand the justification for choosing anticoagulation with Xarelto at this time.  The need for compliance is stressed.  Pt is aware to take the medication once daily with the largest meal of the day.  Side effects of potential bleeding are discussed, including unusual colored urine or stools, coughing up blood or coffee ground emesis, nose bleeds or serious fall or head trauma.  Discussed signs and symptoms of stroke. The patient should avoid any OTC items containing aspirin or ibuprofen.  Avoid alcohol consumption.   Call if any signs of abnormal bleeding.  Discussed financial obligations not having any  difficulty in obtaining medication.  Next lab test test in 6 months. Pt has tolerated Xarelto without any problems.Instructed will call in am with lab results and she states understanding.

## 2012-07-31 ENCOUNTER — Other Ambulatory Visit: Payer: Self-pay | Admitting: Internal Medicine

## 2012-08-16 ENCOUNTER — Telehealth: Payer: Self-pay

## 2012-08-16 NOTE — Telephone Encounter (Signed)
Patient called Alyssa [name] asking that I review her after visit summary to find out about "some type of kidney test listed"

## 2012-08-19 ENCOUNTER — Other Ambulatory Visit: Payer: Self-pay | Admitting: Internal Medicine

## 2012-10-11 ENCOUNTER — Ambulatory Visit (INDEPENDENT_AMBULATORY_CARE_PROVIDER_SITE_OTHER): Payer: Medicare Other | Admitting: Internal Medicine

## 2012-10-11 ENCOUNTER — Other Ambulatory Visit (INDEPENDENT_AMBULATORY_CARE_PROVIDER_SITE_OTHER): Payer: Medicare Other

## 2012-10-11 ENCOUNTER — Encounter: Payer: Self-pay | Admitting: Internal Medicine

## 2012-10-11 VITALS — BP 120/78 | HR 64 | Temp 97.5°F | Resp 16 | Wt 139.0 lb

## 2012-10-11 DIAGNOSIS — N183 Chronic kidney disease, stage 3 unspecified: Secondary | ICD-10-CM

## 2012-10-11 DIAGNOSIS — I4821 Permanent atrial fibrillation: Secondary | ICD-10-CM | POA: Insufficient documentation

## 2012-10-11 DIAGNOSIS — E039 Hypothyroidism, unspecified: Secondary | ICD-10-CM

## 2012-10-11 DIAGNOSIS — M545 Low back pain, unspecified: Secondary | ICD-10-CM

## 2012-10-11 DIAGNOSIS — I4891 Unspecified atrial fibrillation: Secondary | ICD-10-CM

## 2012-10-11 LAB — BASIC METABOLIC PANEL
CO2: 26 mEq/L (ref 19–32)
Calcium: 8.8 mg/dL (ref 8.4–10.5)
Chloride: 99 mEq/L (ref 96–112)
Glucose, Bld: 94 mg/dL (ref 70–99)
Potassium: 4.3 mEq/L (ref 3.5–5.1)
Sodium: 133 mEq/L — ABNORMAL LOW (ref 135–145)

## 2012-10-11 NOTE — Patient Instructions (Signed)
Wound Care Wound care helps prevent pain and infection.  You may need a tetanus shot if:  You cannot remember when you had your last tetanus shot.  You have never had a tetanus shot.  The injury broke your skin. If you need a tetanus shot and you choose not to have one, you may get tetanus. Sickness from tetanus can be serious. HOME CARE   Only take medicine as told by your doctor.  Clean the wound daily with mild soap and water.  Change any bandages (dressings) as told by your doctor.  Put medicated cream and a bandage on the wound as told by your doctor.  Change the bandage if it gets wet, dirty, or starts to smell.  Take showers. Do not take baths, swim, or do anything that puts your wound under water.  Rest and raise (elevate) the wound until the pain and puffiness (swelling) are better.  Keep all doctor visits as told. GET HELP RIGHT AWAY IF:   Yellowish-white fluid (pus) comes from the wound.  Medicine does not lessen your pain.  There is a red streak going away from the wound.  You have a fever. MAKE SURE YOU:   Understand these instructions.  Will watch your condition.  Will get help right away if you are not doing well or get worse. Document Released: 11/04/2007 Document Revised: 04/19/2011 Document Reviewed: 05/31/2010 Exit[employer] Patient Information 2014 [employer].  

## 2012-10-11 NOTE — Assessment & Plan Note (Signed)
I will recheck her renal function If needed, I may lower or stop the xarelto dose if GFR<30

## 2012-10-11 NOTE — Assessment & Plan Note (Signed)
She has good rate and rhythm control Will continue xarelto for now

## 2012-10-11 NOTE — Assessment & Plan Note (Signed)
She has no radicular s/s and tells me that an xray was normal Will continue the current meds for pain relief

## 2012-10-11 NOTE — Assessment & Plan Note (Signed)
I will check her TSH today and will adjust her dose if needed 

## 2012-10-11 NOTE — Progress Notes (Signed)
Subjective:    Patient ID: Alyssa [name], female    DOB: 1924-10-10, 79 y.o.   MRN: [id number]  HPI Comments: She fell one week ago and landed on her low back and injured her left forearm, she was seen at an [facility] and was told that xrays of the injured areas were normal. She has stitches on her left forearm but they are not ready to come out yet. She is getting pain relief with vicodin.  Thyroid Problem Presents for follow-up visit. Patient reports no anxiety, cold intolerance, constipation, depressed mood, diaphoresis, diarrhea, dry skin, fatigue, hair loss, heat intolerance, hoarse voice, leg swelling, nail problem, palpitations, tremors, visual change, weight gain or weight loss. Past treatments include levothyroxine. The treatment provided significant relief.      Review of Systems  Constitutional: Negative.  Negative for fever, chills, weight loss, weight gain, diaphoresis, activity change, appetite change and fatigue.  HENT: Negative.  Negative for hoarse voice.   Eyes: Negative.   Respiratory: Negative.   Cardiovascular: Negative.  Negative for chest pain, palpitations and leg swelling.  Gastrointestinal: Negative.  Negative for nausea, vomiting, abdominal pain, diarrhea and constipation.  Endocrine: Negative.  Negative for cold intolerance and heat intolerance.  Genitourinary: Negative.   Musculoskeletal: Positive for back pain. Negative for myalgias, joint swelling and gait problem.  Skin: Negative.   Allergic/Immunologic: Negative.   Neurological: Negative.  Negative for dizziness, tremors, seizures, syncope, facial asymmetry, speech difficulty, weakness, light-headedness and headaches.  Hematological: Negative.  Negative for adenopathy. Does not bruise/bleed easily.  Psychiatric/Behavioral: Negative.        Objective:   Physical Exam  Vitals reviewed. Constitutional: She is oriented to person, place, and time. She appears well-developed and well-nourished. No distress.    HENT:  Head: Normocephalic and atraumatic.  Mouth/Throat: Oropharynx is clear and moist. No oropharyngeal exudate.  Eyes: Conjunctivae are normal. Right eye exhibits no discharge. Left eye exhibits no discharge. No scleral icterus.  Neck: Normal range of motion. Neck supple. No JVD present. No tracheal deviation present. No thyromegaly present.  Cardiovascular: Normal rate, regular rhythm, normal heart sounds and intact distal pulses.  Exam reveals no gallop and no friction rub.   No murmur heard. Pulmonary/Chest: Effort normal and breath sounds normal. No stridor. No respiratory distress. She has no wheezes. She has no rales. She exhibits no tenderness.  Abdominal: Soft. Bowel sounds are normal. She exhibits no distension and no mass. There is no tenderness. There is no rebound and no guarding.  Musculoskeletal: Normal range of motion. She exhibits no edema and no tenderness.       Lumbar back: Normal. She exhibits normal range of motion, no tenderness, no bony tenderness, no swelling, no edema and no deformity.       Left forearm: She exhibits swelling, deformity and laceration. She exhibits no tenderness and no bony tenderness.       Arms: Lymphadenopathy:    She has no cervical adenopathy.  Neurological: She is oriented to person, place, and time.  Skin: Skin is warm and dry. No rash noted. She is not diaphoretic. No erythema. No pallor.      Lab Results  Component Value Date   WBC 6.0 07/20/2012   HGB 13.1 07/20/2012   HCT 39.5 07/20/2012   PLT 188.0 07/20/2012   GLUCOSE 95 07/20/2012   CHOL 136 06/10/2011   TRIG 79.0 06/10/2011   HDL 56.60 06/10/2011   LDLCALC 64 06/10/2011   ALT 18 06/09/2012   AST 27  06/09/2012   NA 138 07/20/2012   K 4.3 07/20/2012   CL 106 07/20/2012   CREATININE 1.2 07/20/2012   BUN 33* 07/20/2012   CO2 26 07/20/2012   TSH 1.01 06/09/2012   INR 3.9 06/05/2012   HGBA1C 5.9 06/09/2012      Assessment & Plan:

## 2012-10-17 ENCOUNTER — Encounter: Payer: Self-pay | Admitting: Internal Medicine

## 2012-10-17 ENCOUNTER — Ambulatory Visit (INDEPENDENT_AMBULATORY_CARE_PROVIDER_SITE_OTHER): Payer: Medicare Other | Admitting: Internal Medicine

## 2012-10-17 VITALS — BP 132/86 | HR 72 | Temp 98.5°F | Resp 16

## 2012-10-17 DIAGNOSIS — N183 Chronic kidney disease, stage 3 unspecified: Secondary | ICD-10-CM

## 2012-10-17 DIAGNOSIS — E039 Hypothyroidism, unspecified: Secondary | ICD-10-CM

## 2012-10-17 DIAGNOSIS — M545 Low back pain, unspecified: Secondary | ICD-10-CM

## 2012-10-17 NOTE — Assessment & Plan Note (Signed)
This is stable 

## 2012-10-17 NOTE — Assessment & Plan Note (Signed)
Her recent TSH was in the normal range 

## 2012-10-17 NOTE — Patient Instructions (Signed)
Back Pain, Adult  Low back pain is very common. About 1 in 5 people have back pain. The cause of low back pain is rarely dangerous. The pain often gets better over time. About half of people with a sudden onset of back pain feel better in just 2 weeks. About 8 in 10 people feel better by 6 weeks.   CAUSES  Some common causes of back pain include:  · Strain of the muscles or ligaments supporting the spine.  · Wear and tear (degeneration) of the spinal discs.  · Arthritis.  · Direct injury to the back.  DIAGNOSIS  Most of the time, the direct cause of low back pain is not known. However, back pain can be treated effectively even when the exact cause of the pain is unknown. Answering your caregiver's questions about your overall health and symptoms is one of the most accurate ways to make sure the cause of your pain is not dangerous. If your caregiver needs more information, he or she may order lab work or imaging tests (X-rays or MRIs). However, even if imaging tests show changes in your back, this usually does not require surgery.  HOME CARE INSTRUCTIONS  For many people, back pain returns. Since low back pain is rarely dangerous, it is often a condition that people can learn to manage on their own.   · Remain active. It is stressful on the back to sit or stand in one place. Do not sit, drive, or stand in one place for more than 30 minutes at a time. Take short walks on level surfaces as soon as pain allows. Try to increase the length of time you walk each day.  · Do not stay in bed. Resting more than 1 or 2 days can delay your recovery.  · Do not avoid exercise or work. Your body is made to move. It is not dangerous to be active, even though your back may hurt. Your back will likely heal faster if you return to being active before your pain is gone.  · Pay attention to your body when you  bend and lift. Many people have less discomfort when lifting if they bend their knees, keep the load close to their bodies, and  avoid twisting. Often, the most comfortable positions are those that put less stress on your recovering back.  · Find a comfortable position to sleep. Use a firm mattress and lie on your side with your knees slightly bent. If you lie on your back, put a pillow under your knees.  · Only take over-the-counter or prescription medicines as directed by your caregiver. Over-the-counter medicines to reduce pain and inflammation are often the most helpful. Your caregiver may prescribe muscle relaxant drugs. These medicines help dull your pain so you can more quickly return to your normal activities and healthy exercise.  · Put ice on the injured area.  · Put ice in a plastic bag.  · Place a towel between your skin and the bag.  · Leave the ice on for 15-20 minutes, 3-4 times a day for the first 2 to 3 days. After that, ice and heat may be alternated to reduce pain and spasms.  · Ask your caregiver about trying back exercises and gentle massage. This may be of some benefit.  · Avoid feeling anxious or stressed. Stress increases muscle tension and can worsen back pain. It is important to recognize when you are anxious or stressed and learn ways to manage it. Exercise is a great option.  SEEK MEDICAL CARE IF:  · You have pain that is not relieved with rest or   medicine.  · You have pain that does not improve in 1 week.  · You have new symptoms.  · You are generally not feeling well.  SEEK IMMEDIATE MEDICAL CARE IF:   · You have pain that radiates from your back into your legs.  · You develop new bowel or bladder control problems.  · You have unusual weakness or numbness in your arms or legs.  · You develop nausea or vomiting.  · You develop abdominal pain.  · You feel faint.  Document Released: 01/25/2005 Document Revised: 07/27/2011 Document Reviewed: 06/15/2010  Exit[employer]® Patient Information ©2014 [employer].

## 2012-10-17 NOTE — Assessment & Plan Note (Signed)
This is improving.  

## 2012-10-17 NOTE — Progress Notes (Signed)
Subjective:    Patient ID: Alyssa [name], female    DOB: April 30, 1924, 79 y.o.   MRN: [id number]  Back Pain This is a recurrent problem. The current episode started 1 to 4 weeks ago. The problem occurs intermittently. The problem has been gradually improving since onset. The pain is present in the lumbar spine. The quality of the pain is described as aching. The pain does not radiate. The pain is at a severity of 2/10. The pain is mild. The symptoms are aggravated by twisting. Stiffness is present in the morning. Pertinent negatives include no abdominal pain, bladder incontinence, bowel incontinence, chest pain, dysuria, fever, headaches, leg pain, numbness, paresis, paresthesias, pelvic pain, perianal numbness, tingling, weakness or weight loss. Risk factors include recent trauma. She has tried analgesics for the symptoms. The treatment provided moderate relief.      Review of Systems  Constitutional: Negative.  Negative for fever and weight loss.  HENT: Negative.   Eyes: Negative.   Respiratory: Negative.   Cardiovascular: Negative.  Negative for chest pain.  Gastrointestinal: Negative.  Negative for nausea, vomiting, abdominal pain, diarrhea, constipation and bowel incontinence.  Endocrine: Negative.  Negative for cold intolerance.  Genitourinary: Negative.  Negative for bladder incontinence, dysuria and pelvic pain.  Musculoskeletal: Positive for back pain. Negative for myalgias, joint swelling and gait problem.  Skin: Negative.   Allergic/Immunologic: Negative.   Neurological: Negative.  Negative for dizziness, tingling, syncope, speech difficulty, weakness, light-headedness, numbness, headaches and paresthesias.  Hematological: Negative.  Negative for adenopathy. Does not bruise/bleed easily.  Psychiatric/Behavioral: Negative.        Objective:   Physical Exam  Vitals reviewed. Constitutional: She is oriented to person, place, and time. She appears well-developed and well-nourished.  No distress.  HENT:  Head: Normocephalic and atraumatic.  Mouth/Throat: Oropharynx is clear and moist. No oropharyngeal exudate.  Eyes: Conjunctivae are normal. Right eye exhibits no discharge. Left eye exhibits no discharge. No scleral icterus.  Neck: Normal range of motion. Neck supple. No JVD present. No tracheal deviation present. No thyromegaly present.  Cardiovascular: Normal rate, regular rhythm, normal heart sounds and intact distal pulses.  Exam reveals no gallop and no friction rub.   No murmur heard. Pulmonary/Chest: Effort normal and breath sounds normal. No stridor. No respiratory distress. She has no wheezes. She has no rales. She exhibits no tenderness.  Abdominal: Soft. Bowel sounds are normal. She exhibits no distension and no mass. There is no tenderness. There is no rebound and no guarding.  Musculoskeletal: Normal range of motion. She exhibits no edema and no tenderness.       Lumbar back: Normal. She exhibits normal range of motion, no tenderness, no bony tenderness, no swelling, no edema, no deformity, no laceration, no pain, no spasm and normal pulse.  Lymphadenopathy:    She has no cervical adenopathy.  Neurological: She is oriented to person, place, and time.  Skin: Skin is warm and dry. No rash noted. She is not diaphoretic. No erythema. No pallor.  Psychiatric: She has a normal mood and affect. Her behavior is normal. Judgment and thought content normal.      Lab Results  Component Value Date   WBC 6.0 07/20/2012   HGB 13.1 07/20/2012   HCT 39.5 07/20/2012   PLT 188.0 07/20/2012   GLUCOSE 94 10/11/2012   CHOL 136 06/10/2011   TRIG 79.0 06/10/2011   HDL 56.60 06/10/2011   LDLCALC 64 06/10/2011   ALT 18 06/09/2012   AST 27 06/09/2012  NA 133* 10/11/2012   K 4.3 10/11/2012   CL 99 10/11/2012   CREATININE 1.1 10/11/2012   BUN 15 10/11/2012   CO2 26 10/11/2012   TSH 3.50 10/11/2012   INR 3.9 06/05/2012   HGBA1C 5.9 06/09/2012      Assessment & Plan:

## 2012-11-20 ENCOUNTER — Other Ambulatory Visit: Payer: Self-pay | Admitting: Internal Medicine

## 2012-12-12 ENCOUNTER — Ambulatory Visit (INDEPENDENT_AMBULATORY_CARE_PROVIDER_SITE_OTHER): Payer: Medicare Other

## 2012-12-12 DIAGNOSIS — Z23 Encounter for immunization: Secondary | ICD-10-CM

## 2012-12-19 ENCOUNTER — Other Ambulatory Visit: Payer: Self-pay | Admitting: Dermatology

## 2012-12-20 ENCOUNTER — Ambulatory Visit (INDEPENDENT_AMBULATORY_CARE_PROVIDER_SITE_OTHER): Payer: Medicare Other | Admitting: *Deleted

## 2012-12-20 DIAGNOSIS — I4891 Unspecified atrial fibrillation: Secondary | ICD-10-CM

## 2012-12-20 LAB — MDC_IDC_ENUM_SESS_TYPE_INCLINIC
Battery Remaining Longevity: 24 mo
Battery Voltage: 2.73 V
Brady Statistic RV Percent Paced: 100 %
Lead Channel Impedance Value: 528 Ohm
Lead Channel Pacing Threshold Amplitude: 0.5 V
Lead Channel Setting Pacing Amplitude: 2.5 V
Lead Channel Setting Pacing Pulse Width: 0.4 ms

## 2012-12-20 NOTE — Progress Notes (Signed)
Pacemaker check in clinic. Normal device function. Threshold, sensing, and impedance consistent with previous measurements. Device programmed to maximize longevity. Permanent AF + Xarelto. 1 high ventricular rate noted on 7-13 lasting 7 seconds---Max V 154--- slightly irreg---?Conducted AF. Device programmed at appropriate safety margins. Histogram distribution appropriate for patient activity level. Estimated longevity 24 months. Patient will follow up with SK in May 2015.

## 2013-01-17 ENCOUNTER — Ambulatory Visit: Payer: Medicare Other | Admitting: Nurse Practitioner

## 2013-01-18 ENCOUNTER — Ambulatory Visit (INDEPENDENT_AMBULATORY_CARE_PROVIDER_SITE_OTHER): Payer: Medicare Other | Admitting: Internal Medicine

## 2013-01-18 ENCOUNTER — Encounter: Payer: Self-pay | Admitting: Internal Medicine

## 2013-01-18 VITALS — BP 130/84 | HR 84 | Temp 97.0°F | Resp 16 | Wt 137.0 lb

## 2013-01-18 DIAGNOSIS — J069 Acute upper respiratory infection, unspecified: Secondary | ICD-10-CM

## 2013-01-18 MED ORDER — PROMETHAZINE-CODEINE 6.25-10 MG/5ML PO SYRP: 5.0000 mL | ORAL_SOLUTION | ORAL | Status: DC | PRN

## 2013-01-18 MED ORDER — BENZONATATE 100 MG PO CAPS
100.0000 mg | ORAL_CAPSULE | Freq: Three times a day (TID) | ORAL | Status: DC | PRN
Start: 2013-01-18 — End: 2013-08-16

## 2013-01-18 NOTE — Progress Notes (Signed)
Subjective:    Patient ID: Alyssa [name], female    DOB: Jul 06, 1924, 79 y.o.   MRN: [id number]  HPI Alyssa [name] presents with a 7 day h/o sinus congestion, drainage, no fever, cough - nonproductive. Last night she felt SOB, couldn't get a deep breath. NO chest pain or discomfort. She is concerned about her pacemaker and heart disease.  Past Medical History  Diagnosis Date  . Hypothyroidism   . Osteoporosis   . Renal insufficiency   . Thyroid cancer   . Hx of breast cancer   . Atrial fibrillation     pacemaker, chronic anticoag  . MVP (mitral valve prolapse)   . Left inguinal hernia     direct  . IBS (irritable bowel syndrome)   . GERD (gastroesophageal reflux disease)   . Hyperkalemia   . Internal hemorrhoids   . Diverticulosis    Past Surgical History  Procedure Laterality Date  . Thyroidectomy    . Mastectomy  1982    Bilateral  . Tonsillectomy    . Pacemaker insertion    . Upper gastrointestinal endoscopy  09/06/2000    normal  . Colonoscopy  10/28/2005    internal hemorrhoids, diverticulosis (same as in 2002 and random bxs negative then)   Family History  Problem Relation Age of Onset  . Arthritis Other   . Hypertension Other   . Stroke Mother   . Colon cancer Father 68   History   Social History  . Marital Status: Widowed    Spouse Name: N/A    Number of Children: N/A  . Years of Education: N/A   Occupational History  . Retired     Social History Main Topics  . Smoking status: Former Smoker    Quit date: 02/08/1954  . Smokeless tobacco: Never Used  . Alcohol Use: No  . Drug Use: No  . Sexual Activity: Not Currently   Other Topics Concern  . Not on file   Social History Narrative   Regular Exercise: Yes   Daily Caffeine Use: Sometimes.             Current Outpatient Prescriptions on File Prior to Visit  Medication Sig Dispense Refill  . acetaminophen (TYLENOL ARTHRITIS PAIN) 650 MG CR tablet Take 1,300 mg by mouth 2 (two) times daily.         . bifidobacterium infantis (ALIGN) capsule Take 1 capsule by mouth daily.        Marland Kitchen CALCIUM-VITAMIN D PO Take 2 tablets by mouth daily.       Marland Kitchen levothyroxine (SYNTHROID, LEVOTHROID) 88 MCG tablet TAKE ONE TABLET BY MOUTH DAILY  90 tablet  1  . loratadine (CLARITIN) 10 MG tablet Take 0.5-1 tablets (5-10 mg total) by mouth daily.  30 tablet  11  . metoprolol (LOPRESSOR) 50 MG tablet TAKE ONE-HALF TABLET BY MOUTH IN THE MORNING, AND ONE-HALF TABLET BY MOUTH  IN THE EVENING.  90 tablet  0  . RESTASIS 0.05 % ophthalmic emulsion       . Rivaroxaban (XARELTO) 15 MG TABS tablet Take 1 tablet (15 mg total) by mouth daily.  90 tablet  3  . cephALEXin (KEFLEX) 500 MG capsule        No current facility-administered medications on file prior to visit.      Review of Systems System review is negative for any constitutional, cardiac, pulmonary, GI or neuro symptoms or complaints other than as described in the HPI.     Objective:  Physical Exam Filed Vitals:   01/18/13 1611  BP: 130/84  Pulse: 84  Temp: 97 F (36.1 C)  Resp: 16   gen'l - well preserved 79 y/o in no acute distress HEENT- Mild tenderness maxillary sinus, TM left normal, posterior pharynx clear. Neck - supple Nodes - negative neck Chest - CTAP w/o rales or wheezes.         Assessment & Plan:  Viral URI - On exam there is no evidence of a bacterial infection nor any signs of pneumonia with the lungs being very clear to exam. This is most likely a viral upper respiratory infection with some underlying allergy symptoms.  Plan Tessalon Perle 100 mg three times a day - this is for the cough, especially the back of the throat cough  Robitussin DM (or the equivalent): this has an expectorant (DM) and guaifenesin (mucinex) as part of the product: 1 tsp every 4 hours as needed during the day.  Phenergan with codeine 1 tsp at bedtime  Restart the alavert (claritin) once a day  Hydrate  Tylenol as need for fever or  aches  Vitamin c 1500 mg once a day.

## 2013-01-18 NOTE — Patient Instructions (Signed)
Thank you for coming to see [place].  On exam there is no evidence of a bacterial infection nor any signs of pneumonia with the lungs being very clear to exam. This is most likely a viral upper respiratory infection with some underlying allergy symptoms.  Plan Tessalon Perle 100 mg three times a day - this is for the cough, especially the back of the throat cough  Robitussin DM (or the equivalent): this has an expectorant (DM) and guaifenesin (mucinex) as part of the product: 1 tsp every 4 hours as needed during the day.  Phenergan with codeine 1 tsp at bedtime  Restart the alavert (claritin) once a day  Hydrate  Tylenol as need for fever or aches  Vitamin c 1500 mg once a day.

## 2013-01-18 NOTE — Progress Notes (Signed)
Pre visit review using our clinic review tool, if applicable. No additional management support is needed unless otherwise documented below in the visit note. 

## 2013-01-22 ENCOUNTER — Encounter: Payer: Self-pay | Admitting: Internal Medicine

## 2013-01-26 ENCOUNTER — Other Ambulatory Visit: Payer: Self-pay | Admitting: Internal Medicine

## 2013-02-19 ENCOUNTER — Other Ambulatory Visit: Payer: Self-pay | Admitting: Internal Medicine

## 2013-03-01 ENCOUNTER — Encounter: Payer: Self-pay | Admitting: Interventional Cardiology

## 2013-03-01 NOTE — Progress Notes (Signed)
This encounter was created in error - please disregard.

## 2013-03-08 ENCOUNTER — Ambulatory Visit (INDEPENDENT_AMBULATORY_CARE_PROVIDER_SITE_OTHER): Payer: Medicare Other | Admitting: Pharmacist

## 2013-03-08 DIAGNOSIS — Z79899 Other long term (current) drug therapy: Secondary | ICD-10-CM

## 2013-03-08 DIAGNOSIS — I4891 Unspecified atrial fibrillation: Secondary | ICD-10-CM

## 2013-03-08 LAB — CBC
HCT: 39.9 % (ref 36.0–46.0)
HEMOGLOBIN: 12.7 g/dL (ref 12.0–15.0)
MCHC: 32 g/dL (ref 30.0–36.0)
MCV: 92 fl (ref 78.0–100.0)
Platelets: 204 10*3/uL (ref 150.0–400.0)
RBC: 4.34 Mil/uL (ref 3.87–5.11)
RDW: 13.1 % (ref 11.5–14.6)
WBC: 6.7 10*3/uL (ref 4.5–10.5)

## 2013-03-08 LAB — BASIC METABOLIC PANEL
BUN: 28 mg/dL — ABNORMAL HIGH (ref 6–23)
CO2: 29 mEq/L (ref 19–32)
Calcium: 8.7 mg/dL (ref 8.4–10.5)
Chloride: 104 mEq/L (ref 96–112)
Creatinine, Ser: 1.1 mg/dL (ref 0.4–1.2)
GFR: 51.57 mL/min — AB (ref >60.00)
Glucose, Bld: 96 mg/dL (ref 70–99)
Potassium: 4.4 mEq/L (ref 3.5–5.1)
Sodium: 139 mEq/L (ref 135–145)

## 2013-03-08 NOTE — Progress Notes (Signed)
Pt was started on Xarelto 15 mg qd for Afib on 06/09/12  Reviewed patients medication list.  Pt is not currently on any combined P-gp and strong CYP3A4 inhibitors/inducers (ketoconazole, traconazole, ritonavir, carbamazepine, phenytoin, rifampin, St. John's wort).  Reviewed labs.  SCr 1.1 (10/2012), Weight 143 lb, CrCl- 38 ml/min.  Dose appropriate based on CrCl.   Hgb and HCT Within Normal Limits as of 07/20/12.  Rechecking BMET and CBC today. Scr 1.1 (CrCl still ~ 38 ml/min) Hemoglobin 12.7   Blood work from 03/08/13 was normal and stable.  Patient notified.  Patient states her only obstacle was cost late last year after hitting the donut hole.  The cost was manageable, and she would still rather stay on Xarelto than go back to warfarin.    A full discussion of the nature of anticoagulants has been carried out.  A benefit/risk analysis has been presented to the patient, so that they understand the justification for choosing anticoagulation with Xarelto at this time.  The need for compliance is stressed.  Pt is aware to take the medication once daily with the largest meal of the day.  Side effects of potential bleeding are discussed, including unusual colored urine or stools, coughing up blood or coffee ground emesis, nose bleeds or serious fall or head trauma.  Discussed signs and symptoms of stroke. The patient should avoid any OTC items containing aspirin or ibuprofen.  Avoid alcohol consumption.   Call if any signs of abnormal bleeding.  Discussed financial obligations and resolved any difficulty in obtaining medication.  Next visit in 6 months.

## 2013-06-29 ENCOUNTER — Encounter: Payer: Self-pay | Admitting: *Deleted

## 2013-07-16 ENCOUNTER — Other Ambulatory Visit: Payer: Self-pay

## 2013-07-16 DIAGNOSIS — I4891 Unspecified atrial fibrillation: Secondary | ICD-10-CM

## 2013-07-16 MED ORDER — RIVAROXABAN 15 MG PO TABS: 15.0000 mg | ORAL_TABLET | Freq: Every day | ORAL | Status: DC

## 2013-07-18 ENCOUNTER — Telehealth: Payer: Self-pay | Admitting: Pharmacist

## 2013-07-18 MED ORDER — WARFARIN SODIUM 3 MG PO TABS: ORAL_TABLET | ORAL | Status: DC

## 2013-07-18 NOTE — Telephone Encounter (Signed)
Patient called to tell me that she is in the donut hole and can no longer afford Xarelto.  She would like to change back to warfarin for stroke prevention given her h/o AFib.  Patient use to take warfarin 3 mg qd except 4 mg T/Th/Sat last year when she was on warfarin.    Patient advised to take warfarin 4.5 mg today, then start 3 mg qd tomorrow and recheck INR here in 5-6 days.  She will take her last dose of Xarelto tonight as well.  Appointment made.

## 2013-07-23 ENCOUNTER — Ambulatory Visit (INDEPENDENT_AMBULATORY_CARE_PROVIDER_SITE_OTHER): Payer: Medicare Other | Admitting: *Deleted

## 2013-07-23 DIAGNOSIS — I498 Other specified cardiac arrhythmias: Secondary | ICD-10-CM

## 2013-07-23 DIAGNOSIS — Z5181 Encounter for therapeutic drug level monitoring: Secondary | ICD-10-CM

## 2013-07-23 DIAGNOSIS — I4891 Unspecified atrial fibrillation: Secondary | ICD-10-CM

## 2013-07-23 LAB — POCT INR: INR: 2.5

## 2013-07-24 ENCOUNTER — Other Ambulatory Visit: Payer: Self-pay

## 2013-07-24 MED ORDER — LEVOTHYROXINE SODIUM 88 MCG PO TABS
ORAL_TABLET | ORAL | Status: DC
Start: 2013-07-24 — End: 2014-01-21

## 2013-07-30 ENCOUNTER — Ambulatory Visit (INDEPENDENT_AMBULATORY_CARE_PROVIDER_SITE_OTHER): Payer: Medicare Other | Admitting: *Deleted

## 2013-07-30 DIAGNOSIS — I4891 Unspecified atrial fibrillation: Secondary | ICD-10-CM

## 2013-07-30 DIAGNOSIS — Z5181 Encounter for therapeutic drug level monitoring: Secondary | ICD-10-CM

## 2013-07-30 LAB — POCT INR: INR: 4.5

## 2013-08-06 ENCOUNTER — Ambulatory Visit (INDEPENDENT_AMBULATORY_CARE_PROVIDER_SITE_OTHER): Payer: Medicare Other | Admitting: Pharmacist

## 2013-08-06 DIAGNOSIS — Z5181 Encounter for therapeutic drug level monitoring: Secondary | ICD-10-CM

## 2013-08-06 DIAGNOSIS — I4891 Unspecified atrial fibrillation: Secondary | ICD-10-CM

## 2013-08-06 LAB — POCT INR: INR: 3.4

## 2013-08-16 ENCOUNTER — Encounter: Payer: Self-pay | Admitting: Internal Medicine

## 2013-08-16 ENCOUNTER — Ambulatory Visit (INDEPENDENT_AMBULATORY_CARE_PROVIDER_SITE_OTHER): Payer: Medicare Other | Admitting: Internal Medicine

## 2013-08-16 ENCOUNTER — Ambulatory Visit (INDEPENDENT_AMBULATORY_CARE_PROVIDER_SITE_OTHER): Payer: Medicare Other

## 2013-08-16 VITALS — BP 141/82 | HR 86 | Ht 66.0 in | Wt 141.0 lb

## 2013-08-16 DIAGNOSIS — I442 Atrioventricular block, complete: Secondary | ICD-10-CM

## 2013-08-16 DIAGNOSIS — I4891 Unspecified atrial fibrillation: Secondary | ICD-10-CM

## 2013-08-16 DIAGNOSIS — I482 Chronic atrial fibrillation, unspecified: Secondary | ICD-10-CM

## 2013-08-16 DIAGNOSIS — Z5181 Encounter for therapeutic drug level monitoring: Secondary | ICD-10-CM

## 2013-08-16 DIAGNOSIS — Z950 Presence of cardiac pacemaker: Secondary | ICD-10-CM

## 2013-08-16 LAB — MDC_IDC_ENUM_SESS_TYPE_INCLINIC
Battery Impedance: 2718 Ohm
Battery Remaining Longevity: 20 mo
Battery Voltage: 2.71 V
Date Time Interrogation Session: 20150709122314
Lead Channel Impedance Value: 548 Ohm
Lead Channel Pacing Threshold Amplitude: 0.75 V
Lead Channel Pacing Threshold Pulse Width: 0.4 ms
MDC IDC MSMT LEADCHNL RA IMPEDANCE VALUE: 67 Ohm
MDC IDC SET LEADCHNL RV PACING AMPLITUDE: 2.5 V
MDC IDC SET LEADCHNL RV PACING PULSEWIDTH: 0.4 ms
MDC IDC SET LEADCHNL RV SENSING SENSITIVITY: 2 mV
MDC IDC STAT BRADY RV PERCENT PACED: 99 %

## 2013-08-16 LAB — POCT INR: INR: 2.8

## 2013-08-16 NOTE — Patient Instructions (Signed)
Your physician recommends that you continue on your current medications as directed. Please refer to the Current Medication list given to you today.  Your physician wants you to follow-up in: 1 year with Dr. Klein.  You will receive a reminder letter in the mail two months in advance. If you don't receive a letter, please call our office to schedule the follow-up appointment.  

## 2013-08-16 NOTE — Progress Notes (Signed)
Patient Care Team: [name], MD as PCP - General [name], MD (Cardiology)   HPI  Alyssa [name] is a 80 y.o. female seen in followup for complete heart block. She is status post pacemaker implantation. She has atrial fibrillation which is permanent. She is taking and tolerating her Coumadin. There have been no intercurrent problems with exercise intolerance shortness of breath dyspnea or chest pain.  She has had rare episodes in the last couple of months where she has been short of breath at night. This lasted just moments. She attributed to issues related to her daughter and son-in-law which seemed to be better.   Past Medical History  Diagnosis Date  . Hypothyroidism   . Osteoporosis   . Renal insufficiency   . Thyroid cancer   . Hx of breast cancer   . Atrial fibrillation     pacemaker, chronic anticoag  . MVP (mitral valve prolapse)   . Left inguinal hernia     direct  . IBS (irritable bowel syndrome)   . GERD (gastroesophageal reflux disease)   . Hyperkalemia   . Internal hemorrhoids   . Diverticulosis     Past Surgical History  Procedure Laterality Date  . Thyroidectomy    . Mastectomy  1982    Bilateral  . Tonsillectomy    . Pacemaker insertion    . Upper gastrointestinal endoscopy  09/06/2000    normal  . Colonoscopy  10/28/2005    internal hemorrhoids, diverticulosis (same as in 2002 and random bxs negative then)    Current Outpatient Prescriptions  Medication Sig Dispense Refill  . acetaminophen (TYLENOL ARTHRITIS PAIN) 650 MG CR tablet Take 1,300 mg by mouth 2 (two) times daily.        Marland Kitchen CALCIUM-VITAMIN D PO Take 2 tablets by mouth daily.       Marland Kitchen levothyroxine (SYNTHROID, LEVOTHROID) 88 MCG tablet TAKE ONE TABLET BY MOUTH ONCE DAILY  90 tablet  1  . loratadine (CLARITIN) 10 MG tablet Take 0.5-1 tablets (5-10 mg total) by mouth daily.  30 tablet  11  . metoprolol (LOPRESSOR) 50 MG tablet TAKE ONE-HALF TABLET BY MOUTH IN THE MORNING AND TAKE  ONE-HALF TABLET BY MOUTH IN THE EVENING.  90 tablet  3  . warfarin (COUMADIN) 3 MG tablet Take as directed by [facility].  40 tablet  1   No current facility-administered medications for this visit.    No Known Allergies  Review of Systems negative except from HPI and PMH  Physical Exam BP 141/82  Pulse 86  Ht 5\' 6"  (1.676 m)  Wt 141 lb (63.957 kg)  BMI 22.77 kg/m2 Well developed and well nourished in no acute distress HENT normal E scleral and icterus clear Neck Supple JVP flat; carotids brisk and full Clear to ausculation  Regular rate and rhythm, no murmurs gallops or rub Soft with active bowel sounds No clubbing cyanosis no Edema Alert and oriented, grossly normal motor and sensory function Skin Warm and Dry    Assessment and  Plan  Complete heart block  Atrial fibrillation    Dyspnea    Pacemaker-Med[employer]onic The patient's device was interrogated.  The information was reviewed. No changes were made in the programming.    I suspect that the episodes of dyspnea are related to stress as she has her life. There is no accompanying fluids. There are no arrhythmias noted on her device and the spells are very brief

## 2013-08-30 ENCOUNTER — Ambulatory Visit (INDEPENDENT_AMBULATORY_CARE_PROVIDER_SITE_OTHER): Payer: Medicare Other | Admitting: *Deleted

## 2013-08-30 DIAGNOSIS — I4891 Unspecified atrial fibrillation: Secondary | ICD-10-CM

## 2013-08-30 DIAGNOSIS — Z5181 Encounter for therapeutic drug level monitoring: Secondary | ICD-10-CM

## 2013-08-30 LAB — POCT INR: INR: 1.9

## 2013-09-20 ENCOUNTER — Ambulatory Visit (INDEPENDENT_AMBULATORY_CARE_PROVIDER_SITE_OTHER): Payer: Medicare Other | Admitting: *Deleted

## 2013-09-20 DIAGNOSIS — Z5181 Encounter for therapeutic drug level monitoring: Secondary | ICD-10-CM

## 2013-09-20 DIAGNOSIS — I4891 Unspecified atrial fibrillation: Secondary | ICD-10-CM

## 2013-09-20 LAB — POCT INR: INR: 2.1

## 2013-10-05 ENCOUNTER — Other Ambulatory Visit: Payer: Self-pay | Admitting: Internal Medicine

## 2013-10-18 ENCOUNTER — Ambulatory Visit (INDEPENDENT_AMBULATORY_CARE_PROVIDER_SITE_OTHER): Payer: Medicare Other | Admitting: *Deleted

## 2013-10-18 DIAGNOSIS — Z5181 Encounter for therapeutic drug level monitoring: Secondary | ICD-10-CM

## 2013-10-18 DIAGNOSIS — I4891 Unspecified atrial fibrillation: Secondary | ICD-10-CM

## 2013-10-18 LAB — POCT INR: INR: 1.7

## 2013-10-29 ENCOUNTER — Other Ambulatory Visit: Payer: Self-pay | Admitting: Dermatology

## 2013-11-01 ENCOUNTER — Ambulatory Visit (INDEPENDENT_AMBULATORY_CARE_PROVIDER_SITE_OTHER): Payer: Medicare Other

## 2013-11-01 DIAGNOSIS — Z5181 Encounter for therapeutic drug level monitoring: Secondary | ICD-10-CM

## 2013-11-01 DIAGNOSIS — I4891 Unspecified atrial fibrillation: Secondary | ICD-10-CM

## 2013-11-01 LAB — POCT INR: INR: 2.3

## 2013-11-20 ENCOUNTER — Ambulatory Visit (INDEPENDENT_AMBULATORY_CARE_PROVIDER_SITE_OTHER): Payer: Medicare Other | Admitting: Pharmacist

## 2013-11-20 DIAGNOSIS — I4891 Unspecified atrial fibrillation: Secondary | ICD-10-CM

## 2013-11-20 DIAGNOSIS — Z5181 Encounter for therapeutic drug level monitoring: Secondary | ICD-10-CM

## 2013-11-20 LAB — POCT INR: INR: 2.1

## 2013-11-29 ENCOUNTER — Ambulatory Visit (INDEPENDENT_AMBULATORY_CARE_PROVIDER_SITE_OTHER): Payer: Medicare Other | Admitting: *Deleted

## 2013-11-29 DIAGNOSIS — Z23 Encounter for immunization: Secondary | ICD-10-CM

## 2013-12-18 ENCOUNTER — Ambulatory Visit (INDEPENDENT_AMBULATORY_CARE_PROVIDER_SITE_OTHER): Payer: Medicare Other | Admitting: Pharmacist

## 2013-12-18 DIAGNOSIS — I4891 Unspecified atrial fibrillation: Secondary | ICD-10-CM

## 2013-12-18 DIAGNOSIS — Z5181 Encounter for therapeutic drug level monitoring: Secondary | ICD-10-CM

## 2013-12-18 LAB — POCT INR: INR: 3.2

## 2014-01-02 ENCOUNTER — Ambulatory Visit (INDEPENDENT_AMBULATORY_CARE_PROVIDER_SITE_OTHER): Payer: Medicare Other | Admitting: *Deleted

## 2014-01-02 DIAGNOSIS — I482 Chronic atrial fibrillation, unspecified: Secondary | ICD-10-CM

## 2014-01-02 LAB — MDC_IDC_ENUM_SESS_TYPE_INCLINIC
Battery Impedance: 3026 Ohm
Date Time Interrogation Session: 20151125153342
Lead Channel Impedance Value: 67 Ohm
Lead Channel Pacing Threshold Amplitude: 0.75 V
Lead Channel Pacing Threshold Pulse Width: 0.4 ms
Lead Channel Setting Pacing Amplitude: 2.5 V
MDC IDC MSMT BATTERY REMAINING LONGEVITY: 18 mo
MDC IDC MSMT BATTERY VOLTAGE: 2.7 V
MDC IDC MSMT LEADCHNL RV IMPEDANCE VALUE: 513 Ohm
MDC IDC SET LEADCHNL RV PACING PULSEWIDTH: 0.4 ms
MDC IDC SET LEADCHNL RV SENSING SENSITIVITY: 2 mV
MDC IDC STAT BRADY RV PERCENT PACED: 96 %

## 2014-01-02 NOTE — Progress Notes (Signed)
Pacemaker check in clinic. Normal device function. Thresholds, sensing, impedances consistent with previous measurements. Device programmed to maximize longevity. 6 high ventricular rates noted 6-21 seconds. Device programmed at appropriate safety margins. Histogram distribution appropriate for patient activity level. Device programmed to optimize intrinsic conduction. Estimated longevity <5-30 months with an average of 18 months. ROV 3 months with the device clinic.

## 2014-01-08 ENCOUNTER — Encounter: Payer: Self-pay | Admitting: Internal Medicine

## 2014-01-08 ENCOUNTER — Ambulatory Visit (INDEPENDENT_AMBULATORY_CARE_PROVIDER_SITE_OTHER): Payer: Medicare Other | Admitting: Pharmacist Clinician (PhC)/ Clinical Pharmacy Specialist

## 2014-01-08 DIAGNOSIS — Z5181 Encounter for therapeutic drug level monitoring: Secondary | ICD-10-CM

## 2014-01-08 DIAGNOSIS — I4891 Unspecified atrial fibrillation: Secondary | ICD-10-CM

## 2014-01-08 LAB — POCT INR: INR: 2.8

## 2014-01-20 ENCOUNTER — Other Ambulatory Visit: Payer: Self-pay | Admitting: Internal Medicine

## 2014-01-21 ENCOUNTER — Other Ambulatory Visit: Payer: Self-pay | Admitting: *Deleted

## 2014-01-21 MED ORDER — LEVOTHYROXINE SODIUM 88 MCG PO TABS: ORAL_TABLET | ORAL | Status: DC

## 2014-01-21 NOTE — Telephone Encounter (Signed)
Left msg on triage stating she only have 1 levothyroxine left. Made cpx appt for 02/11/14. Wanting a refill until appt. Called pt inform her will send 30 day to [facility] until appt...Johny Chess

## 2014-01-22 ENCOUNTER — Other Ambulatory Visit: Payer: Self-pay | Admitting: *Deleted

## 2014-01-22 MED ORDER — LEVOTHYROXINE SODIUM 88 MCG PO TABS: ORAL_TABLET | ORAL | Status: DC

## 2014-02-05 ENCOUNTER — Ambulatory Visit (INDEPENDENT_AMBULATORY_CARE_PROVIDER_SITE_OTHER): Payer: Medicare Other | Admitting: *Deleted

## 2014-02-05 DIAGNOSIS — Z5181 Encounter for therapeutic drug level monitoring: Secondary | ICD-10-CM

## 2014-02-05 DIAGNOSIS — I4891 Unspecified atrial fibrillation: Secondary | ICD-10-CM

## 2014-02-05 LAB — POCT INR: INR: 2

## 2014-02-11 ENCOUNTER — Other Ambulatory Visit (INDEPENDENT_AMBULATORY_CARE_PROVIDER_SITE_OTHER): Payer: Medicare Other

## 2014-02-11 ENCOUNTER — Ambulatory Visit (INDEPENDENT_AMBULATORY_CARE_PROVIDER_SITE_OTHER): Payer: Medicare Other | Admitting: Internal Medicine

## 2014-02-11 ENCOUNTER — Other Ambulatory Visit: Payer: Self-pay | Admitting: Internal Medicine

## 2014-02-11 ENCOUNTER — Encounter: Payer: Self-pay | Admitting: Internal Medicine

## 2014-02-11 VITALS — BP 140/88 | HR 69 | Temp 97.5°F | Resp 16 | Ht 66.0 in | Wt 144.0 lb

## 2014-02-11 DIAGNOSIS — E038 Other specified hypothyroidism: Secondary | ICD-10-CM

## 2014-02-11 DIAGNOSIS — N183 Chronic kidney disease, stage 3 unspecified: Secondary | ICD-10-CM

## 2014-02-11 DIAGNOSIS — Z23 Encounter for immunization: Secondary | ICD-10-CM

## 2014-02-11 DIAGNOSIS — Z0000 Encounter for general adult medical examination without abnormal findings: Secondary | ICD-10-CM

## 2014-02-11 DIAGNOSIS — I480 Paroxysmal atrial fibrillation: Secondary | ICD-10-CM

## 2014-02-11 DIAGNOSIS — N189 Chronic kidney disease, unspecified: Secondary | ICD-10-CM

## 2014-02-11 LAB — COMPREHENSIVE METABOLIC PANEL
ALK PHOS: 47 U/L (ref 39–117)
ALT: 17 U/L (ref 0–35)
AST: 24 U/L (ref 0–37)
Albumin: 4.2 g/dL (ref 3.5–5.2)
BUN: 22 mg/dL (ref 6–23)
CO2: 29 mEq/L (ref 19–32)
Calcium: 9 mg/dL (ref 8.4–10.5)
Chloride: 103 mEq/L (ref 96–112)
Creatinine, Ser: 1 mg/dL (ref 0.4–1.2)
GFR: 53.18 mL/min — ABNORMAL LOW (ref >60.00)
Glucose, Bld: 89 mg/dL (ref 70–99)
Potassium: 4.8 mEq/L (ref 3.5–5.1)
SODIUM: 140 meq/L (ref 135–145)
TOTAL PROTEIN: 6.6 g/dL (ref 6.0–8.3)
Total Bilirubin: 0.7 mg/dL (ref 0.2–1.2)

## 2014-02-11 LAB — TSH: TSH: 1.28 u[IU]/mL (ref 0.35–4.50)

## 2014-02-11 LAB — CBC WITH DIFFERENTIAL/PLATELET
BASOS PCT: 0.8 % (ref 0.0–3.0)
Basophils Absolute: 0 10*3/uL (ref 0.0–0.1)
EOS ABS: 0.3 10*3/uL (ref 0.0–0.7)
Eosinophils Relative: 5.7 % — ABNORMAL HIGH (ref 0.0–5.0)
HCT: 40.9 % (ref 36.0–46.0)
Hemoglobin: 13.5 g/dL (ref 12.0–15.0)
LYMPHS ABS: 1.7 10*3/uL (ref 0.7–4.0)
LYMPHS PCT: 30.2 % (ref 12.0–46.0)
MCHC: 33 g/dL (ref 30.0–36.0)
MCV: 91 fl (ref 78.0–100.0)
Monocytes Absolute: 0.6 10*3/uL (ref 0.1–1.0)
Monocytes Relative: 10.9 % (ref 3.0–12.0)
NEUTROS ABS: 3 10*3/uL (ref 1.4–7.7)
Neutrophils Relative %: 52.4 % (ref 43.0–77.0)
Platelets: 201 10*3/uL (ref 150.0–400.0)
RBC: 4.49 Mil/uL (ref 3.87–5.11)
RDW: 13.6 % (ref 11.5–15.5)
WBC: 5.8 10*3/uL (ref 4.0–10.5)

## 2014-02-11 LAB — LIPID PANEL
CHOLESTEROL: 155 mg/dL (ref 0–200)
HDL: 46.1 mg/dL (ref >39.00)
LDL CALC: 92 mg/dL (ref 0–99)
NonHDL: 108.9
Total CHOL/HDL Ratio: 3
Triglycerides: 84 mg/dL (ref 0.0–149.0)
VLDL: 16.8 mg/dL (ref 0.0–40.0)

## 2014-02-11 MED ORDER — PNEUMOCOCCAL 13-VAL CONJ VACC IM SUSP
0.5000 mL | INTRAMUSCULAR | Status: AC
  Administered 2014-02-11: 0.5 mL via INTRAMUSCULAR

## 2014-02-11 NOTE — Progress Notes (Signed)
   Subjective:    Patient ID: Alyssa [name], female    DOB: [birth year], 81 y.o.   MRN: [id number]  Thyroid Problem Presents for follow-up visit. Patient reports no anxiety, cold intolerance, constipation, depressed mood, diaphoresis, diarrhea, dry skin, fatigue, hair loss, heat intolerance, hoarse voice, leg swelling, nail problem, palpitations, tremors, visual change, weight gain or weight loss. The symptoms have been stable. Past treatments include levothyroxine. The treatment provided significant relief.      Review of Systems  Constitutional: Negative.  Negative for fever, chills, weight loss, weight gain, diaphoresis, appetite change and fatigue.  HENT: Negative.  Negative for hoarse voice.   Eyes: Negative.   Respiratory: Negative.  Negative for cough, choking, chest tightness, shortness of breath and stridor.   Cardiovascular: Negative.  Negative for chest pain, palpitations and leg swelling.  Gastrointestinal: Negative.  Negative for nausea, vomiting, abdominal pain, diarrhea and constipation.  Endocrine: Negative.  Negative for cold intolerance and heat intolerance.  Genitourinary: Negative.   Musculoskeletal: Negative.  Negative for myalgias, back pain, joint swelling and arthralgias.  Skin: Negative.  Negative for rash.  Allergic/Immunologic: Negative.   Neurological: Negative.  Negative for tremors.  Hematological: Negative.  Negative for adenopathy. Does not bruise/bleed easily.  Psychiatric/Behavioral: Negative.  The patient is not nervous/anxious.        Objective:   Physical Exam  Constitutional: She is oriented to person, place, and time. She appears well-developed and well-nourished. No distress.  HENT:  Head: Normocephalic and atraumatic.  Mouth/Throat: Oropharynx is clear and moist. No oropharyngeal exudate.  Eyes: Conjunctivae are normal. Right eye exhibits no discharge. Left eye exhibits no discharge. No scleral icterus.  Neck: Normal range of motion. Neck  supple. No JVD present. No tracheal deviation present. No thyromegaly present.  Cardiovascular: Normal rate, regular rhythm, normal heart sounds and intact distal pulses.  Exam reveals no gallop and no friction rub.   No murmur heard. Pulmonary/Chest: Effort normal and breath sounds normal. No stridor. No respiratory distress. She has no wheezes. She has no rales. She exhibits no tenderness.  Abdominal: Soft. Bowel sounds are normal. She exhibits no distension and no mass. There is no tenderness. There is no rebound and no guarding.  Musculoskeletal: Normal range of motion. She exhibits no edema or tenderness.  Lymphadenopathy:    She has no cervical adenopathy.  Neurological: She is oriented to person, place, and time.  Skin: Skin is warm and dry. No rash noted. She is not diaphoretic. No erythema. No pallor.  Psychiatric: She has a normal mood and affect. Her behavior is normal. Judgment normal.  Vitals reviewed.     Lab Results  Component Value Date   WBC 6.7 03/08/2013   HGB 12.7 03/08/2013   HCT 39.9 03/08/2013   PLT 204.0 03/08/2013   GLUCOSE 96 03/08/2013   CHOL 136 06/10/2011   TRIG 79.0 06/10/2011   HDL 56.60 06/10/2011   LDLCALC 64 06/10/2011   ALT 18 06/09/2012   AST 27 06/09/2012   NA 139 03/08/2013   K 4.4 03/08/2013   CL 104 03/08/2013   CREATININE 1.1 03/08/2013   BUN 28* 03/08/2013   CO2 29 03/08/2013   TSH 3.50 10/11/2012   INR 2.0 02/05/2014   HGBA1C 5.9 06/09/2012      Assessment & Plan:

## 2014-02-11 NOTE — Assessment & Plan Note (Signed)

## 2014-02-11 NOTE — Assessment & Plan Note (Signed)
She has good rate and rhythm control 

## 2014-02-11 NOTE — Assessment & Plan Note (Signed)
Will monitor her renal function She will avoid nephrotoxic agents

## 2014-02-11 NOTE — Patient Instructions (Signed)
Hypothyroidism The thyroid is a large gland located in the lower front of your neck. The thyroid gland helps control metabolism. Metabolism is how your body handles food. It controls metabolism with the hormone thyroxine. When this gland is underactive (hypothyroid), it produces too little hormone.  CAUSES These include:   Absence or destruction of thyroid tissue.  Goiter due to iodine deficiency.  Goiter due to medications.  Congenital defects (since birth).  Problems with the pituitary. This causes a lack of TSH (thyroid stimulating hormone). This hormone tells the thyroid to turn out more hormone. SYMPTOMS  Lethargy (feeling as though you have no energy)  Cold intolerance  Weight gain (in spite of normal food intake)  Dry skin  Coarse hair  Menstrual irregularity (if severe, may lead to infertility)  Slowing of thought processes Cardiac problems are also caused by insufficient amounts of thyroid hormone. Hypothyroidism in the newborn is cretinism, and is an extreme form. It is important that this form be treated adequately and immediately or it will lead rapidly to retarded physical and mental development. DIAGNOSIS  To prove hypothyroidism, your caregiver may do blood tests and ultrasound tests. Sometimes the signs are hidden. It may be necessary for your caregiver to watch this illness with blood tests either before or after diagnosis and treatment. TREATMENT  Low levels of thyroid hormone are increased by using synthetic thyroid hormone. This is a safe, effective treatment. It usually takes about four weeks to gain the full effects of the medication. After you have the full effect of the medication, it will generally take another four weeks for problems to leave. Your caregiver may start you on low doses. If you have had heart problems the dose may be gradually increased. It is generally not an emergency to get rapidly to normal. HOME CARE INSTRUCTIONS   Take your  medications as your caregiver suggests. Let your caregiver know of any medications you are taking or start taking. Your caregiver will help you with dosage schedules.  As your condition improves, your dosage needs may increase. It will be necessary to have continuing blood tests as suggested by your caregiver.  Report all suspected medication side effects to your caregiver. SEEK MEDICAL CARE IF: Seek medical care if you develop:  Sweating.  Tremulousness (tremors).  Anxiety.  Rapid weight loss.  Heat intolerance.  Emotional swings.  Diarrhea.  Weakness. SEEK IMMEDIATE MEDICAL CARE IF:  You develop chest pain, an irregular heart beat (palpitations), or a rapid heart beat. MAKE SURE YOU:   Understand these instructions.  Will watch your condition.  Will get help right away if you are not doing well or get worse. Document Released: 01/25/2005 Document Revised: 04/19/2011 Document Reviewed: 09/15/2007 Exit[employer] Patient Information 2015 [employer]. This information is not intended to replace advice given to you by your health care provider. Make sure you discuss any questions you have with your health care provider.  

## 2014-02-11 NOTE — Assessment & Plan Note (Signed)
I will recheck her TSH level and will adjust her dose if needed 

## 2014-02-12 ENCOUNTER — Encounter: Payer: Self-pay | Admitting: Internal Medicine

## 2014-02-20 ENCOUNTER — Other Ambulatory Visit: Payer: Self-pay | Admitting: Dermatology

## 2014-03-05 ENCOUNTER — Ambulatory Visit (INDEPENDENT_AMBULATORY_CARE_PROVIDER_SITE_OTHER): Payer: Medicare Other | Admitting: Pharmacist Clinician (PhC)/ Clinical Pharmacy Specialist

## 2014-03-05 DIAGNOSIS — I4891 Unspecified atrial fibrillation: Secondary | ICD-10-CM

## 2014-03-05 DIAGNOSIS — Z5181 Encounter for therapeutic drug level monitoring: Secondary | ICD-10-CM

## 2014-03-05 LAB — POCT INR: INR: 2

## 2014-03-15 ENCOUNTER — Other Ambulatory Visit: Payer: Self-pay | Admitting: Internal Medicine

## 2014-03-21 ENCOUNTER — Other Ambulatory Visit: Payer: Self-pay | Admitting: Internal Medicine

## 2014-03-21 ENCOUNTER — Telehealth: Payer: Self-pay | Admitting: Internal Medicine

## 2014-03-21 NOTE — Telephone Encounter (Signed)
Patient is reqeusting that levothyroxine (SYNTHROID, LEVOTHROID) 88 MCG tablet [[id number]] be issued with 60 pills rather than 30.

## 2014-03-22 MED ORDER — LEVOTHYROXINE SODIUM 88 MCG PO TABS: 88.0000 ug | ORAL_TABLET | Freq: Every day | ORAL | Status: DC

## 2014-03-22 NOTE — Telephone Encounter (Signed)
Directions for meds are 1 tab daily. Unable to send in #60, however, I can send for #90 per insurance.

## 2014-04-04 ENCOUNTER — Ambulatory Visit (INDEPENDENT_AMBULATORY_CARE_PROVIDER_SITE_OTHER): Payer: Medicare Other | Admitting: *Deleted

## 2014-04-04 ENCOUNTER — Ambulatory Visit (INDEPENDENT_AMBULATORY_CARE_PROVIDER_SITE_OTHER): Payer: Medicare Other

## 2014-04-04 DIAGNOSIS — I482 Chronic atrial fibrillation, unspecified: Secondary | ICD-10-CM

## 2014-04-04 DIAGNOSIS — Z5181 Encounter for therapeutic drug level monitoring: Secondary | ICD-10-CM

## 2014-04-04 DIAGNOSIS — I4891 Unspecified atrial fibrillation: Secondary | ICD-10-CM

## 2014-04-04 LAB — MDC_IDC_ENUM_SESS_TYPE_INCLINIC
Battery Remaining Longevity: 16 mo
Battery Voltage: 2.69 V
Brady Statistic RV Percent Paced: 96 %
Date Time Interrogation Session: 20160225150229
Lead Channel Impedance Value: 519 Ohm
Lead Channel Pacing Threshold Amplitude: 0.75 V
Lead Channel Pacing Threshold Pulse Width: 0.4 ms
Lead Channel Setting Pacing Amplitude: 2.5 V
Lead Channel Setting Pacing Pulse Width: 0.4 ms
Lead Channel Setting Sensing Sensitivity: 2 mV
MDC IDC MSMT BATTERY IMPEDANCE: 3307 Ohm
MDC IDC MSMT LEADCHNL RA IMPEDANCE VALUE: 67 Ohm

## 2014-04-04 LAB — POCT INR: INR: 2

## 2014-04-04 NOTE — Progress Notes (Signed)
Pacemaker check in clinic. Normal device function. Thresholds, sensing, impedances consistent with previous measurements. Device programmed to maximize longevity. No mode switch or high ventricular rates noted. Device programmed at appropriate safety margins. Histogram distribution appropriate for patient activity level. Device programmed to optimize intrinsic conduction. Estimated longevity <4-28 months with an average of 16 months. Patient enrolled in remote follow-up/TTM's with [employer]. Plan to follow every 3 months remotely and see annually in office. Patient education completed.  The patient has noted over the past few weeks a discoloring of her skin around the site with minimal edema.  She denies any fever or chills.  The skin is intact.  Per Dr. Gwynneth Macleod in 2 weeks with the Device Clinic when Dr. Caryl Comes is also in the office.  The patient was instructed to call if she develops fever or chills and also if the swelling or discoloration increases.

## 2014-04-25 ENCOUNTER — Encounter: Payer: Self-pay | Admitting: Internal Medicine

## 2014-05-01 ENCOUNTER — Ambulatory Visit (INDEPENDENT_AMBULATORY_CARE_PROVIDER_SITE_OTHER): Payer: Medicare Other | Admitting: *Deleted

## 2014-05-01 DIAGNOSIS — Z5181 Encounter for therapeutic drug level monitoring: Secondary | ICD-10-CM | POA: Diagnosis not present

## 2014-05-01 DIAGNOSIS — I4891 Unspecified atrial fibrillation: Secondary | ICD-10-CM | POA: Diagnosis not present

## 2014-05-01 LAB — POCT INR: INR: 2.2

## 2014-05-06 ENCOUNTER — Ambulatory Visit (INDEPENDENT_AMBULATORY_CARE_PROVIDER_SITE_OTHER): Payer: Medicare Other | Admitting: *Deleted

## 2014-05-06 DIAGNOSIS — I442 Atrioventricular block, complete: Secondary | ICD-10-CM

## 2014-05-06 LAB — MDC_IDC_ENUM_SESS_TYPE_REMOTE
Battery Impedance: 3539 Ohm
Battery Voltage: 2.68 V
Brady Statistic RV Percent Paced: 96 %
Date Time Interrogation Session: 20160328115046
Lead Channel Impedance Value: 512 Ohm
Lead Channel Impedance Value: 67 Ohm
Lead Channel Pacing Threshold Pulse Width: 0.4 ms
Lead Channel Setting Pacing Amplitude: 2.5 V
Lead Channel Setting Sensing Sensitivity: 2 mV
MDC IDC MSMT BATTERY REMAINING LONGEVITY: 15 mo
MDC IDC MSMT LEADCHNL RV PACING THRESHOLD AMPLITUDE: 0.875 V
MDC IDC SET LEADCHNL RV PACING PULSEWIDTH: 0.4 ms

## 2014-05-06 NOTE — Progress Notes (Signed)
Remote pacemaker transmission.   

## 2014-05-15 ENCOUNTER — Encounter: Payer: Self-pay | Admitting: Cardiology

## 2014-05-23 ENCOUNTER — Encounter: Payer: Self-pay | Admitting: Internal Medicine

## 2014-06-12 ENCOUNTER — Ambulatory Visit (INDEPENDENT_AMBULATORY_CARE_PROVIDER_SITE_OTHER): Payer: Medicare Other | Admitting: *Deleted

## 2014-06-12 DIAGNOSIS — I4891 Unspecified atrial fibrillation: Secondary | ICD-10-CM

## 2014-06-12 DIAGNOSIS — Z5181 Encounter for therapeutic drug level monitoring: Secondary | ICD-10-CM

## 2014-06-12 LAB — POCT INR: INR: 2.2

## 2014-07-01 ENCOUNTER — Telehealth: Payer: Self-pay | Admitting: Internal Medicine

## 2014-07-01 NOTE — Telephone Encounter (Signed)
She was asked to return for a thyroid check

## 2014-07-01 NOTE — Telephone Encounter (Signed)
Notified pt with md response.../lmb 

## 2014-07-01 NOTE — Telephone Encounter (Signed)
Patient called stating she does not know why she was instructed to come back in 4 months from her visit on 02/11/14. She states she wants a regular check up. I advised her that it appears she had one in January. Patient was having difficulty understanding what she needs and when she needs it. She requested that I ask Dr. [name]. CB# [phone]

## 2014-07-04 ENCOUNTER — Telehealth: Payer: Self-pay | Admitting: Internal Medicine

## 2014-07-04 NOTE — Telephone Encounter (Signed)
Pt c/o BP issue: STAT if pt c/o blurred vision, one-sided weakness or slurred speech  1. What are your last 5 BP readings? At 11am this morning Right arm 156/123 and Left arm 157/94  2. Are you having any other symptoms (ex. Dizziness, headache, blurred vision, passed out)? No  3. What is your BP issue?  Pt calling stating that when she went to the dentist this morning her BP was very high. Please call back and advise.

## 2014-07-04 NOTE — Telephone Encounter (Signed)
Patient gives me BPs that were taken at the dentist office this morning.  She also explains that she did not take her Synthroid nor her Metoprolol this morning and this may the contributing factor to the increased pressure readings.  Advised to monitor BP over the next few days -- if still elevated to contact PCP for advisement of BP control.  She is agreeable to this.  She also states that she has been having trouble sleeping secondary to heart fluttering/racing.  She will send a transmission for [place] to review.  She is aware I will contact her once reviewed.

## 2014-07-05 ENCOUNTER — Telehealth: Payer: Self-pay | Admitting: Internal Medicine

## 2014-07-05 NOTE — Telephone Encounter (Signed)
Follow Up  Pt returned call. She says that this is urgent. No further details

## 2014-07-05 NOTE — Telephone Encounter (Signed)
Follow up  ° ° ° °Returning call back to nurse  °

## 2014-07-05 NOTE — Telephone Encounter (Signed)
Informed patient that transmission was reviewed and nothing with HR above 150. Patient states that HR fluttering/racing does not occur often, but occasionally.  She just wanted to make sure if wasn't a concerning high heart rate.  She will call back if problems persists/worsens. Patient also told me that she sees her PCP on Tuesday for her BP.

## 2014-07-05 NOTE — Telephone Encounter (Signed)
Patient updating me on Dr. [name] office advising her to call us (which she already has, and this is not related to AFib, nor is AFib out of control) and go to urgent care. Informed patient that she will need to make that decision on her own whether or not to go to urgent care as we will not advise her secondary to this being BP issue and not AFib issue.  But did tell her that she needs to address BP whether it be at urgent care or PCP. I reminded her that per our earlier discussion we have already checked her device and found no concerns with heart rhythm/rate.  She verbalized understanding.

## 2014-07-24 ENCOUNTER — Ambulatory Visit (INDEPENDENT_AMBULATORY_CARE_PROVIDER_SITE_OTHER): Payer: Medicare Other | Admitting: *Deleted

## 2014-07-24 DIAGNOSIS — Z5181 Encounter for therapeutic drug level monitoring: Secondary | ICD-10-CM

## 2014-07-24 DIAGNOSIS — I4891 Unspecified atrial fibrillation: Secondary | ICD-10-CM | POA: Diagnosis not present

## 2014-07-24 LAB — POCT INR: INR: 4.7

## 2014-07-25 ENCOUNTER — Ambulatory Visit (INDEPENDENT_AMBULATORY_CARE_PROVIDER_SITE_OTHER): Payer: Medicare Other | Admitting: Internal Medicine

## 2014-07-25 ENCOUNTER — Encounter: Payer: Self-pay | Admitting: Internal Medicine

## 2014-07-25 ENCOUNTER — Other Ambulatory Visit (INDEPENDENT_AMBULATORY_CARE_PROVIDER_SITE_OTHER): Payer: Medicare Other

## 2014-07-25 ENCOUNTER — Ambulatory Visit (INDEPENDENT_AMBULATORY_CARE_PROVIDER_SITE_OTHER)
Admission: RE | Admit: 2014-07-25 | Discharge: 2014-07-25 | Disposition: A | Discharge status: Home / Self Care | Payer: Medicare Other | Source: Ambulatory Visit | Attending: Internal Medicine | Admitting: Internal Medicine

## 2014-07-25 VITALS — BP 130/90 | HR 71 | Temp 97.6°F | Resp 16 | Ht 66.0 in | Wt 143.0 lb

## 2014-07-25 DIAGNOSIS — E038 Other specified hypothyroidism: Secondary | ICD-10-CM | POA: Diagnosis not present

## 2014-07-25 DIAGNOSIS — S99912A Unspecified injury of left ankle, initial encounter: Secondary | ICD-10-CM

## 2014-07-25 DIAGNOSIS — S99919A Unspecified injury of unspecified ankle, initial encounter: Secondary | ICD-10-CM | POA: Insufficient documentation

## 2014-07-25 LAB — TSH: TSH: 1.6 u[IU]/mL (ref 0.35–4.50)

## 2014-07-25 NOTE — Patient Instructions (Signed)

## 2014-07-25 NOTE — Progress Notes (Signed)
Subjective:  Patient ID: Alyssa [name], female    DOB: [birth year]  Age: 81 y.o. MRN: [id number]  CC: Hypothyroidism   HPI Alyssa [name] presents for a recheck of left foot injury that occurred about one week ago, she has pain/swelling/bruising over the left ankle and foot but she can bear weight on the left foot without pain or difficulty. She takes tylenol for pain. She is also here for a thyroid check, she complains of fatigue.  Outpatient Prescriptions Prior to Visit  Medication Sig Dispense Refill  . acetaminophen (TYLENOL ARTHRITIS PAIN) 650 MG CR tablet Take 1,300 mg by mouth 2 (two) times daily.      Marland Kitchen levothyroxine (SYNTHROID, LEVOTHROID) 88 MCG tablet Take 1 tablet (88 mcg total) by mouth daily. 90 tablet 2  . loratadine (CLARITIN) 10 MG tablet Take 0.5-1 tablets (5-10 mg total) by mouth daily. 30 tablet 11  . metoprolol (LOPRESSOR) 50 MG tablet TAKE ONE-HALF TABLET BY MOUTH IN THE MORNING, AND TAKE ONE-HALF TABLET BY MOUTH IN THE EVENING. 90 tablet 11  . warfarin (COUMADIN) 3 MG tablet TAKE AS DIRECTED BY COUMADIN CLINIC 40 tablet 3   No facility-administered medications prior to visit.    ROS Review of Systems  Constitutional: Positive for fatigue. Negative for fever, chills, diaphoresis, appetite change and unexpected weight change.  HENT: Negative.   Eyes: Negative.   Respiratory: Negative.  Negative for cough, choking, chest tightness, shortness of breath and stridor.   Cardiovascular: Negative.  Negative for chest pain, palpitations and leg swelling.  Gastrointestinal: Negative.  Negative for nausea, vomiting, abdominal pain, diarrhea, constipation and blood in stool.  Endocrine: Negative.   Genitourinary: Negative.   Musculoskeletal: Positive for arthralgias (left ankle pain). Negative for myalgias, back pain, joint swelling, gait problem and neck pain.  Skin: Negative.   Allergic/Immunologic: Negative.   Neurological: Negative.  Negative for dizziness.    Hematological: Negative.  Negative for adenopathy. Does not bruise/bleed easily.  Psychiatric/Behavioral: Negative.     Objective:  BP 130/90 mmHg  Pulse 71  Temp(Src) 97.6 F (36.4 C) (Oral)  Ht 5\' 6"  (1.676 m)  Wt 143 lb (64.864 kg)  BMI 23.09 kg/m2  SpO2 97%  BP Readings from Last 3 Encounters:  07/25/14 130/90  02/11/14 140/88  08/16/13 141/82    Wt Readings from Last 3 Encounters:  07/25/14 143 lb (64.864 kg)  02/11/14 144 lb (65.318 kg)  08/16/13 141 lb (63.957 kg)    Physical Exam  Constitutional: She is oriented to person, place, and time. She appears well-developed and well-nourished. No distress.  HENT:  Head: Normocephalic and atraumatic.  Mouth/Throat: Oropharynx is clear and moist. No oropharyngeal exudate.  Eyes: Conjunctivae are normal. Right eye exhibits no discharge. Left eye exhibits no discharge. No scleral icterus.  Neck: Normal range of motion. Neck supple. No JVD present. No tracheal deviation present. No thyromegaly present.  Cardiovascular: Normal rate, regular rhythm, normal heart sounds and intact distal pulses.  Exam reveals no gallop and no friction rub.   No murmur heard. Pulmonary/Chest: Effort normal and breath sounds normal. No stridor. No respiratory distress. She has no wheezes. She has no rales. She exhibits no tenderness.  Abdominal: Soft. Bowel sounds are normal. She exhibits no distension and no mass. There is no tenderness. There is no rebound and no guarding.  Musculoskeletal: Normal range of motion. She exhibits no edema or tenderness.       Left ankle: She exhibits swelling, ecchymosis and deformity. She exhibits  normal range of motion, no laceration and normal pulse. No medial malleolus and no proximal fibula tenderness found. Achilles tendon normal. Achilles tendon exhibits no pain, no defect and normal Thompson's test results.       Left foot: There is swelling. There is normal range of motion, no tenderness, no bony tenderness,  normal capillary refill, no crepitus, no deformity and no laceration.       Feet:  Lymphadenopathy:    She has no cervical adenopathy.  Neurological: She is oriented to person, place, and time.  Skin: Skin is warm and dry. No rash noted. She is not diaphoretic. No erythema. No pallor.  Vitals reviewed.   Lab Results  Component Value Date   WBC 5.8 02/11/2014   HGB 13.5 02/11/2014   HCT 40.9 02/11/2014   PLT 201.0 02/11/2014   GLUCOSE 89 02/11/2014   CHOL 155 02/11/2014   TRIG 84.0 02/11/2014   HDL 46.10 02/11/2014   LDLCALC 92 02/11/2014   ALT 17 02/11/2014   AST 24 02/11/2014   NA 140 02/11/2014   K 4.8 02/11/2014   CL 103 02/11/2014   CREATININE 1.0 02/11/2014   BUN 22 02/11/2014   CO2 29 02/11/2014   TSH 1.60 07/25/2014   INR 4.7 07/24/2014   HGBA1C 5.9 06/09/2012    Dg Cervical Spine Complete  10/04/2011   *RADIOLOGY REPORT*  Clinical Data: Chronic left side neck pain.  CERVICAL SPINE - COMPLETE 4+ VIEW  Comparison: Plain films cervical spine 06/10/2011.  Findings: Vertebral body height is maintained.  There is severe loss of disc space height and bulky endplate spurring at M4-6 and C6-7.  Marked multilevel facet arthropathy is identified.  Facet degenerative disease results in 0.2 cm of anterolisthesis of C4 on C5, unchanged.  Prevertebral soft tissues appear normal.  Lung apices are clear.  IMPRESSION: No acute finding.  No change in severe appearing multilevel degenerative disease.   Original Report Authenticated By: Arvid Right. Luther Parody, M.D.    Assessment & Plan:   Alyssa [name] was seen today for hypothyroidism.  Diagnoses and all orders for this visit:  Other specified hypothyroidism - her TSh is on the normal range, will cont the current synthroid dose Orders: -     TSH; Future  Ankle injury, left, initial encounter - films are normal, this is an abrasion/contusion complicated by anticoagulation, she will rest/ice/elevated, will cont tylenol as needed Orders: -      DG Foot Complete Left; Future -     DG Ankle Complete Left; Future   I am having Alyssa [name] maintain her acetaminophen, loratadine, metoprolol, warfarin, and levothyroxine.  No orders of the defined types were placed in this encounter.     Follow-up: Return in about 3 weeks (around 08/15/2014).  [name], MD

## 2014-07-25 NOTE — Progress Notes (Signed)
Pre visit review using our clinic review tool, if applicable. No additional management support is needed unless otherwise documented below in the visit note. 

## 2014-08-05 ENCOUNTER — Encounter: Payer: Medicare Other | Admitting: *Deleted

## 2014-08-05 ENCOUNTER — Ambulatory Visit (INDEPENDENT_AMBULATORY_CARE_PROVIDER_SITE_OTHER): Payer: Medicare Other | Admitting: *Deleted

## 2014-08-05 DIAGNOSIS — I4891 Unspecified atrial fibrillation: Secondary | ICD-10-CM | POA: Diagnosis not present

## 2014-08-05 DIAGNOSIS — Z5181 Encounter for therapeutic drug level monitoring: Secondary | ICD-10-CM | POA: Diagnosis not present

## 2014-08-05 LAB — POCT INR: INR: 3.8

## 2014-08-06 ENCOUNTER — Encounter: Payer: Self-pay | Admitting: Cardiology

## 2014-08-08 ENCOUNTER — Other Ambulatory Visit (INDEPENDENT_AMBULATORY_CARE_PROVIDER_SITE_OTHER): Payer: Medicare Other

## 2014-08-08 ENCOUNTER — Ambulatory Visit (INDEPENDENT_AMBULATORY_CARE_PROVIDER_SITE_OTHER): Payer: Medicare Other | Admitting: Internal Medicine

## 2014-08-08 ENCOUNTER — Encounter: Payer: Self-pay | Admitting: Internal Medicine

## 2014-08-08 VITALS — BP 142/90 | HR 76 | Temp 97.7°F | Ht 66.0 in | Wt 137.0 lb

## 2014-08-08 DIAGNOSIS — R197 Diarrhea, unspecified: Secondary | ICD-10-CM

## 2014-08-08 DIAGNOSIS — N189 Chronic kidney disease, unspecified: Secondary | ICD-10-CM

## 2014-08-08 DIAGNOSIS — N183 Chronic kidney disease, stage 3 unspecified: Secondary | ICD-10-CM

## 2014-08-08 DIAGNOSIS — E038 Other specified hypothyroidism: Secondary | ICD-10-CM

## 2014-08-08 LAB — BASIC METABOLIC PANEL
BUN: 12 mg/dL (ref 6–23)
CO2: 29 meq/L (ref 19–32)
Calcium: 9.4 mg/dL (ref 8.4–10.5)
Chloride: 99 mEq/L (ref 96–112)
Creatinine, Ser: 1.02 mg/dL (ref 0.40–1.20)
GFR: 54.32 mL/min — ABNORMAL LOW (ref >60.00)
Glucose, Bld: 102 mg/dL — ABNORMAL HIGH (ref 70–99)
POTASSIUM: 4.8 meq/L (ref 3.5–5.1)
SODIUM: 136 meq/L (ref 135–145)

## 2014-08-08 LAB — CBC WITH DIFFERENTIAL/PLATELET
BASOS PCT: 0.4 % (ref 0.0–3.0)
Basophils Absolute: 0 10*3/uL (ref 0.0–0.1)
EOS ABS: 0.1 10*3/uL (ref 0.0–0.7)
EOS PCT: 2.7 % (ref 0.0–5.0)
HCT: 42.3 % (ref 36.0–46.0)
Hemoglobin: 14 g/dL (ref 12.0–15.0)
LYMPHS PCT: 30.1 % (ref 12.0–46.0)
Lymphs Abs: 1.6 10*3/uL (ref 0.7–4.0)
MCHC: 33.2 g/dL (ref 30.0–36.0)
MCV: 90.9 fl (ref 78.0–100.0)
MONOS PCT: 9.6 % (ref 3.0–12.0)
Monocytes Absolute: 0.5 10*3/uL (ref 0.1–1.0)
Neutro Abs: 3 10*3/uL (ref 1.4–7.7)
Neutrophils Relative %: 57.2 % (ref 43.0–77.0)
Platelets: 274 10*3/uL (ref 150.0–400.0)
RBC: 4.66 Mil/uL (ref 3.87–5.11)
RDW: 12.4 % (ref 11.5–15.5)
WBC: 5.3 10*3/uL (ref 4.0–10.5)

## 2014-08-08 LAB — SEDIMENTATION RATE: SED RATE: 10 mm/h (ref 0–22)

## 2014-08-08 MED ORDER — LOPERAMIDE HCL 2 MG PO CAPS
4.0000 mg | ORAL_CAPSULE | Freq: Two times a day (BID) | ORAL | Status: DC | PRN
Start: 2014-08-08 — End: 2014-09-09

## 2014-08-08 NOTE — Patient Instructions (Signed)

## 2014-08-08 NOTE — Progress Notes (Signed)
Subjective:  Patient ID: Alyssa [name], female    DOB: Dec 24, 1924  Age: 81 y.o. MRN: [id number]  CC: Diarrhea   HPI MOZELLA REXRODE presents for the complaint of diarrhea that has been a chronic problem for her but she tells me that it has worsened over the last few months with about 2 profuse watery stools per day. She has not taken anything for the diarrhea. She does not relate it to dairy products or wheat. She has a hx of IBS-d. It does not awaken her from her sleep.  Outpatient Prescriptions Prior to Visit  Medication Sig Dispense Refill  . acetaminophen (TYLENOL ARTHRITIS PAIN) 650 MG CR tablet Take 1,300 mg by mouth 2 (two) times daily.      Marland Kitchen levothyroxine (SYNTHROID, LEVOTHROID) 88 MCG tablet Take 1 tablet (88 mcg total) by mouth daily. 90 tablet 2  . loratadine (CLARITIN) 10 MG tablet Take 0.5-1 tablets (5-10 mg total) by mouth daily. 30 tablet 11  . metoprolol (LOPRESSOR) 50 MG tablet TAKE ONE-HALF TABLET BY MOUTH IN THE MORNING, AND TAKE ONE-HALF TABLET BY MOUTH IN THE EVENING. 90 tablet 11  . warfarin (COUMADIN) 3 MG tablet TAKE AS DIRECTED BY COUMADIN CLINIC 40 tablet 3   No facility-administered medications prior to visit.    ROS Review of Systems  Constitutional: Positive for unexpected weight change (some wt loss). Negative for fever, chills, diaphoresis, activity change, appetite change and fatigue.  HENT: Negative.   Eyes: Negative.   Respiratory: Negative.  Negative for cough, choking, chest tightness, shortness of breath and stridor.   Cardiovascular: Negative.  Negative for chest pain, palpitations and leg swelling.  Gastrointestinal: Positive for abdominal pain (rare, internittent cramping) and diarrhea. Negative for nausea, vomiting, constipation, blood in stool, abdominal distention, anal bleeding and rectal pain.  Endocrine: Negative.   Genitourinary: Negative.  Negative for difficulty urinating.  Musculoskeletal: Negative.  Negative for myalgias, arthralgias and  gait problem.  Skin: Negative.  Negative for rash.  Allergic/Immunologic: Negative.   Neurological: Negative.  Negative for dizziness, tremors, weakness and light-headedness.  Hematological: Negative.  Negative for adenopathy. Does not bruise/bleed easily.  Psychiatric/Behavioral: Negative.     Objective:  BP 142/90 mmHg  Pulse 76  Temp(Src) 97.7 F (36.5 C) (Oral)  Ht 5\' 6"  (1.676 m)  Wt 137 lb (62.143 kg)  BMI 22.12 kg/m2  SpO2 95%  BP Readings from Last 3 Encounters:  08/08/14 142/90  07/25/14 130/90  02/11/14 140/88    Wt Readings from Last 3 Encounters:  08/08/14 137 lb (62.143 kg)  07/25/14 143 lb (64.864 kg)  02/11/14 144 lb (65.318 kg)    Physical Exam  Constitutional: She is oriented to person, place, and time.  Non-toxic appearance. She does not have a sickly appearance. She does not appear ill. No distress.  HENT:  Mouth/Throat: Oropharynx is clear and moist. No oropharyngeal exudate.  Eyes: Conjunctivae are normal. Right eye exhibits no discharge. Left eye exhibits no discharge. No scleral icterus.  Neck: Normal range of motion. Neck supple. No JVD present. No tracheal deviation present. No thyromegaly present.  Cardiovascular: Normal rate, regular rhythm, normal heart sounds and intact distal pulses.  Exam reveals no gallop and no friction rub.   No murmur heard. Pulmonary/Chest: Effort normal and breath sounds normal. No stridor. No respiratory distress. She has no wheezes. She has no rales. She exhibits no tenderness.  Abdominal: Soft. Normal appearance. She exhibits no distension and no mass. Bowel sounds are increased. There is  no hepatosplenomegaly, splenomegaly or hepatomegaly. There is no tenderness. There is no rebound, no guarding and no CVA tenderness.  Musculoskeletal: Normal range of motion. She exhibits no edema or tenderness.  Lymphadenopathy:    She has no cervical adenopathy.  Neurological: She is oriented to person, place, and time.  Skin:  Skin is warm and dry. No rash noted. She is not diaphoretic. No erythema. No pallor.  Psychiatric: She has a normal mood and affect. Her behavior is normal. Judgment and thought content normal.  Vitals reviewed.   Lab Results  Component Value Date   WBC 5.3 08/08/2014   HGB 14.0 08/08/2014   HCT 42.3 08/08/2014   PLT 274.0 08/08/2014   GLUCOSE 102* 08/08/2014   CHOL 155 02/11/2014   TRIG 84.0 02/11/2014   HDL 46.10 02/11/2014   LDLCALC 92 02/11/2014   ALT 17 02/11/2014   AST 24 02/11/2014   NA 136 08/08/2014   K 4.8 08/08/2014   CL 99 08/08/2014   CREATININE 1.02 08/08/2014   BUN 12 08/08/2014   CO2 29 08/08/2014   TSH 1.60 07/25/2014   INR 3.8 08/05/2014   HGBA1C 5.9 06/09/2012    Dg Ankle Complete Left  07/25/2014   CLINICAL DATA:  Fall 1 week ago. Twisting injury to the LEFT foot and ankle. Initial encounter.  EXAM: LEFT FOOT - COMPLETE 3+ VIEW; LEFT ANKLE COMPLETE - 3+ VIEW  COMPARISON:  None.  FINDINGS: Alignment of the bones of the LEFT foot is within normal limits. Mild first MTP joint osteoarthritis. Negative for fracture.  The L1 of the ankle is anatomic. The ankle mortise is congruent. Talar dome appears normal.  IMPRESSION: Negative.   Electronically Signed   By: Dereck Ligas M.D.   On: 07/25/2014 14:57   Dg Foot Complete Left  07/25/2014   CLINICAL DATA:  Fall 1 week ago. Twisting injury to the LEFT foot and ankle. Initial encounter.  EXAM: LEFT FOOT - COMPLETE 3+ VIEW; LEFT ANKLE COMPLETE - 3+ VIEW  COMPARISON:  None.  FINDINGS: Alignment of the bones of the LEFT foot is within normal limits. Mild first MTP joint osteoarthritis. Negative for fracture.  The L1 of the ankle is anatomic. The ankle mortise is congruent. Talar dome appears normal.  IMPRESSION: Negative.   Electronically Signed   By: Dereck Ligas M.D.   On: 07/25/2014 14:57    Assessment & Plan:   Cherith was seen today for diarrhea.  Diagnoses and all orders for this visit:  Other specified  hypothyroidism- recent Tsh was in the normal range Orders: -     CBC with Differential/Platelet; Future  Chronic renal insufficiency, stage III (moderate)- this is stable Orders: -     Basic metabolic panel; Future -     CBC with Differential/Platelet; Future  Diarrhea- will check stool studies for infection, will screen for celiac dz., IBD, and anemia/abnormal lytes. If this is all normal then will consider GI referral to consider endoscopy to see if she has collagenous colitis or EPI. She will try loperamide for symptom relief. Orders: -     Basic metabolic panel; Future -     CBC with Differential/Platelet; Future -     Sedimentation rate; Future -     Clostridium difficile EIA; Future -     Stool culture; Future -     Giardia/cryptosporidium (EIA); Future -     Fecal lactoferrin; Future -     Gliadin antibodies, serum -     Tissue transglutaminase,  IgA -     Reticulin Antibody, IgA w reflex titer -     loperamide (IMODIUM) 2 MG capsule; Take 2 capsules (4 mg total) by mouth 2 (two) times daily as needed for diarrhea or loose stools.   I am having Ms. Moon start on loperamide. I am also having her maintain her acetaminophen, loratadine, metoprolol, warfarin, and levothyroxine.  Meds ordered this encounter  Medications  . loperamide (IMODIUM) 2 MG capsule    Sig: Take 2 capsules (4 mg total) by mouth 2 (two) times daily as needed for diarrhea or loose stools.    Dispense:  60 capsule    Refill:  1     Follow-up: Return in about 3 weeks (around 08/29/2014).  [name], MD

## 2014-08-08 NOTE — Progress Notes (Signed)
Pre visit review using our clinic review tool, if applicable. No additional management support is needed unless otherwise documented below in the visit note. 

## 2014-08-09 LAB — GLIADIN ANTIBODIES, SERUM
Gliadin IgA: 4 Units (ref <20)
Gliadin IgG: 4 Units (ref <20)

## 2014-08-09 LAB — RETICULIN ANTIBODIES, IGA W TITER: Reticulin Ab, IgA: NEGATIVE

## 2014-08-09 LAB — TISSUE TRANSGLUTAMINASE, IGA: Tissue Transglutaminase Ab, IgA: 1 U/mL (ref <4)

## 2014-08-10 ENCOUNTER — Other Ambulatory Visit: Payer: Self-pay | Admitting: Internal Medicine

## 2014-08-10 ENCOUNTER — Encounter: Payer: Self-pay | Admitting: Internal Medicine

## 2014-08-10 DIAGNOSIS — R197 Diarrhea, unspecified: Secondary | ICD-10-CM

## 2014-08-13 ENCOUNTER — Other Ambulatory Visit: Payer: Self-pay | Admitting: Internal Medicine

## 2014-08-13 ENCOUNTER — Telehealth: Payer: Self-pay | Admitting: Internal Medicine

## 2014-08-13 ENCOUNTER — Other Ambulatory Visit: Payer: Medicare Other

## 2014-08-13 ENCOUNTER — Ambulatory Visit (INDEPENDENT_AMBULATORY_CARE_PROVIDER_SITE_OTHER): Payer: Medicare Other | Admitting: *Deleted

## 2014-08-13 DIAGNOSIS — R197 Diarrhea, unspecified: Secondary | ICD-10-CM

## 2014-08-13 DIAGNOSIS — I442 Atrioventricular block, complete: Secondary | ICD-10-CM

## 2014-08-13 NOTE — Telephone Encounter (Signed)
Spoke w/ pt and informed her that her transmission was not received. Pt verbalized understanding and will send another transmission today.

## 2014-08-13 NOTE — Telephone Encounter (Signed)
New message    Pt calling regarding letter about transmission not being received.  Please advise

## 2014-08-13 NOTE — Telephone Encounter (Signed)
Pt didn't understand terminology on MyChart. Reviewed Glucose numbers in comparison to six months ago on 6/30 labs

## 2014-08-13 NOTE — Telephone Encounter (Signed)
Pt wants you to please call her to go over lab results she received via MyChart.

## 2014-08-14 ENCOUNTER — Telehealth: Payer: Self-pay

## 2014-08-14 ENCOUNTER — Telehealth: Payer: Self-pay | Admitting: Internal Medicine

## 2014-08-14 ENCOUNTER — Encounter: Payer: Self-pay | Admitting: Physician Assistant

## 2014-08-14 LAB — C. DIFFICILE GDH AND TOXIN A/B

## 2014-08-14 LAB — GIARDIA/CRYPTOSPORIDIUM (EIA)
CRYPTOSPORIDIUM SCREEN (EIA) (SOL): NEGATIVE
GIARDIA SCREEN (EIA): NEGATIVE

## 2014-08-14 NOTE — Progress Notes (Signed)
Remote pacemaker transmission.   

## 2014-08-14 NOTE — Telephone Encounter (Signed)
Left message asking pat to return my call----patient needs to talk with Yomaira Solar

## 2014-08-14 NOTE — Telephone Encounter (Signed)
Pt called in said that she has a few questions about the referral to the GI, she wanted to speak with a nurse   Cell number is best

## 2014-08-14 NOTE — Telephone Encounter (Signed)
Please ask the pt to return to the lab to submit another sample

## 2014-08-14 NOTE — Telephone Encounter (Signed)
Per casey in lab, patient did not send in enough stool sample to complete all tests ordered---the test that could not be completed was Cdiff test---fyi.Marland KitchenMarland Kitchen

## 2014-08-14 NOTE — Telephone Encounter (Signed)
Patient advised, patient stated it might take several days before she has another sample, but she will bring in as soon as she does

## 2014-08-15 ENCOUNTER — Telehealth: Payer: Self-pay

## 2014-08-15 ENCOUNTER — Other Ambulatory Visit: Payer: Medicare Other

## 2014-08-15 ENCOUNTER — Other Ambulatory Visit: Payer: Self-pay | Admitting: Internal Medicine

## 2014-08-15 ENCOUNTER — Encounter: Payer: Self-pay | Admitting: Internal Medicine

## 2014-08-15 DIAGNOSIS — R197 Diarrhea, unspecified: Secondary | ICD-10-CM

## 2014-08-15 NOTE — Telephone Encounter (Signed)
Per Norton Pastel w/ [facility], patient dropped off a stool specimen the other day but was not enough to test. Patient stopped back in today to provide additional specimen and will need order for C-Diff only re-entered into Epic

## 2014-08-17 LAB — STOOL CULTURE

## 2014-08-17 LAB — C. DIFFICILE GDH AND TOXIN A/B
C. difficile GDH: NOT DETECTED
C. difficile Toxin A/B: NOT DETECTED

## 2014-08-18 ENCOUNTER — Encounter: Payer: Self-pay | Admitting: Internal Medicine

## 2014-08-20 ENCOUNTER — Ambulatory Visit (INDEPENDENT_AMBULATORY_CARE_PROVIDER_SITE_OTHER): Payer: Medicare Other

## 2014-08-20 DIAGNOSIS — I4891 Unspecified atrial fibrillation: Secondary | ICD-10-CM

## 2014-08-20 DIAGNOSIS — Z5181 Encounter for therapeutic drug level monitoring: Secondary | ICD-10-CM

## 2014-08-20 LAB — POCT INR

## 2014-08-20 LAB — PROTIME-INR
INR: 15.6 ratio (ref 0.8–1.0)
PROTHROMBIN TIME: 160.2 s — AB (ref 9.6–13.1)

## 2014-08-20 MED ORDER — PHYTONADIONE 5 MG PO TABS: 5.0000 mg | ORAL_TABLET | Freq: Once | ORAL | Status: DC

## 2014-08-23 ENCOUNTER — Ambulatory Visit (INDEPENDENT_AMBULATORY_CARE_PROVIDER_SITE_OTHER): Payer: Medicare Other

## 2014-08-23 DIAGNOSIS — Z5181 Encounter for therapeutic drug level monitoring: Secondary | ICD-10-CM | POA: Diagnosis not present

## 2014-08-23 DIAGNOSIS — I4891 Unspecified atrial fibrillation: Secondary | ICD-10-CM | POA: Diagnosis not present

## 2014-08-23 LAB — POCT INR: INR: 2.2

## 2014-08-26 ENCOUNTER — Ambulatory Visit (INDEPENDENT_AMBULATORY_CARE_PROVIDER_SITE_OTHER): Payer: Medicare Other | Admitting: Physician Assistant

## 2014-08-26 ENCOUNTER — Encounter: Payer: Self-pay | Admitting: Physician Assistant

## 2014-08-26 ENCOUNTER — Telehealth: Payer: Self-pay

## 2014-08-26 VITALS — BP 124/78 | HR 66 | Ht 66.0 in | Wt 134.6 lb

## 2014-08-26 DIAGNOSIS — K589 Irritable bowel syndrome without diarrhea: Secondary | ICD-10-CM | POA: Diagnosis not present

## 2014-08-26 DIAGNOSIS — I4891 Unspecified atrial fibrillation: Secondary | ICD-10-CM | POA: Diagnosis not present

## 2014-08-26 DIAGNOSIS — R197 Diarrhea, unspecified: Secondary | ICD-10-CM | POA: Diagnosis not present

## 2014-08-26 DIAGNOSIS — Z7901 Long term (current) use of anticoagulants: Secondary | ICD-10-CM

## 2014-08-26 MED ORDER — METRONIDAZOLE 250 MG PO TABS: 250.0000 mg | ORAL_TABLET | Freq: Three times a day (TID) | ORAL | Status: DC

## 2014-08-26 MED ORDER — HYOSCYAMINE SULFATE 0.125 MG SL SUBL: 0.1250 mg | SUBLINGUAL_TABLET | Freq: Three times a day (TID) | SUBLINGUAL | Status: DC | PRN

## 2014-08-26 NOTE — Patient Instructions (Addendum)
We have sent medications to your pharmacy for you to pick up at your convenience.  You will hear from your cardiologist about adjusting your warfarin while taking flagyl.   Start a dairy free diet.

## 2014-08-26 NOTE — Progress Notes (Signed)
Patient ID: Alyssa [name], female   DOB: [birth year], 81 y.o.   MRN: [id number]    HPI:  Alyssa [name] is a 81 y.o.   female who is known to Dr. Carlean Purl with a history of GERD, and IBS. She also has a history of atrial fibrillation for which she is on chronic anticoagulation with warfarin, hypothyroidism, osteoporosis, chronic renal insufficiency stage III, and she has had a pacemaker placed. She has had a colonoscopy in 2007 and 2002. She has never had colon polyps. She has a long-standing history of IBS with intermittent loose stools and diarrhea and occasional right lower quadrant pain. Her father had colon cancer at age 14 or 45. She reports she had been doing fairly well until mid June when she developed diarrhea. She had been constipated for several weeks prior to that and then started to have diarrhea. She has had stool cultures done which were negative. Currently she is having one or 2 mushy bowel movements daily with no bright red blood per rectum and no melena. She has occasional left lower quadrant crampy abdominal pain prior to defecation, alleviated with defecation. She reports that when she eats it will start cramps in the left lower quadrant and then she will have a bowel movement. She feels excessively gassy and has been passing copious amounts of foul-smelling flatulence.   Past Medical History  Diagnosis Date  . Hypothyroidism   . Osteoporosis   . Renal insufficiency   . Thyroid cancer   . Hx of breast cancer   . Atrial fibrillation     pacemaker, chronic anticoag  . MVP (mitral valve prolapse)   . Left inguinal hernia     direct  . IBS (irritable bowel syndrome)   . GERD (gastroesophageal reflux disease)   . Hyperkalemia   . Internal hemorrhoids   . Diverticulosis     Past Surgical History  Procedure Laterality Date  . Thyroidectomy    . Mastectomy  1982    Bilateral  . Tonsillectomy    . Pacemaker insertion    . Upper gastrointestinal endoscopy  09/06/2000    normal    . Colonoscopy  10/28/2005    internal hemorrhoids, diverticulosis (same as in 2002 and random bxs negative then)   Family History  Problem Relation Age of Onset  . Arthritis Other   . Hypertension Other   . Stroke Mother   . Colon cancer Father 39   History  Substance Use Topics  . Smoking status: Former Smoker    Quit date: 02/08/1954  . Smokeless tobacco: Never Used  . Alcohol Use: No   Current Outpatient Prescriptions  Medication Sig Dispense Refill  . acetaminophen (TYLENOL ARTHRITIS PAIN) 650 MG CR tablet Take 1,300 mg by mouth 2 (two) times daily.      Marland Kitchen levothyroxine (SYNTHROID, LEVOTHROID) 88 MCG tablet Take 1 tablet (88 mcg total) by mouth daily. 90 tablet 2  . loperamide (IMODIUM) 2 MG capsule Take 2 capsules (4 mg total) by mouth 2 (two) times daily as needed for diarrhea or loose stools. 60 capsule 1  . loratadine (CLARITIN) 10 MG tablet Take 0.5-1 tablets (5-10 mg total) by mouth daily. 30 tablet 11  . metoprolol (LOPRESSOR) 50 MG tablet TAKE ONE-HALF TABLET BY MOUTH IN THE MORNING, AND TAKE ONE-HALF TABLET BY MOUTH IN THE EVENING. 90 tablet 11  . phytonadione (MEPHYTON) 5 MG tablet Take 1 tablet (5 mg total) by mouth once. Take 1 tablet by mouth NOW 1 tablet  0  . warfarin (COUMADIN) 3 MG tablet TAKE AS DIRECTED BY COUMADIN CLINIC 40 tablet 3  . hyoscyamine (LEVSIN SL) 0.125 MG SL tablet Place 1 tablet (0.125 mg total) under the tongue every 8 (eight) hours as needed. 30 tablet 1  . metroNIDAZOLE (FLAGYL) 250 MG tablet Take 1 tablet (250 mg total) by mouth 3 (three) times daily. 30 tablet 0   No current facility-administered medications for this visit.   No Known Allergies   Review of Systems: Gen: Denies any fever, chills, sweats, anorexia, fatigue, weakness, malaise, weight loss, and sleep disorder CV: Denies chest pain, angina,  syncope, orthopnea, PND, peripheral edema, and claudication. Resp: Denies dyspnea at rest, dyspnea with exercise, cough, sputum,  wheezing, coughing up blood, and pleurisy. GI: Denies vomiting blood, jaundice, and fecal incontinence.   Denies dysphagia or odynophagia. GU : Denies urinary burning, blood in urine, urinary frequency, urinary hesitancy, nocturnal urination, and urinary incontinence. MS: Denies joint pain, limitation of movement, and swelling, stiffness, low back pain, extremity pain. Denies muscle weakness, cramps, atrophy.  Derm: Denies rash, itching, dry skin, hives, moles, warts, or unhealing ulcers.  Psych: Denies depression, anxiety, memory loss, suicidal ideation, hallucinations, paranoia, and confusion. Heme: Denies bruising, bleeding, and enlarged lymph nodes. Neuro:  Denies any headaches, dizziness, paresthesias. Endo:  Denies any problems with DM, thyroid, adrenal function    LAB RESULTS: Stool for C. difficile on 08/15/2014 negative. Stool culture on 08/12/2005 no salmonella, shigella, Campylobacter, Yersinia, or Escherichia coli.  Prior Endoscopies:  See history of present illness  Physical Exam: BP 124/78 mmHg  Pulse 66  Ht 5\' 6"  (1.676 m)  Wt 134 lb 9.6 oz (61.054 kg)  BMI 21.74 kg/m2 Constitutional: Pleasant,well-developed, elderly, Caucasian female in no acute distress. HEENT: Normocephalic and atraumatic. Conjunctivae are normal. No scleral icterus. Neck supple. No cervical adenopathy Cardiovascular: Normal rate, regular rhythm.  Pulmonary/chest: Effort normal and breath sounds normal. No wheezing, rales or rhonchi. Abdominal: Soft, nondistended, nontender. Bowel sounds active throughout. There are no masses palpable. No hepatomegaly. Extremities: no edema Lymphadenopathy: No cervical adenopathy noted. Neurological: Alert and oriented to person place and time. Skin: Skin is warm and dry. No rashes noted. Psychiatric: Normal mood and affect. Behavior is normal.  ASSESSMENT AND PLAN: 81 year old female with a history of IBS presenting with several weeks of diarrhea and  intermittent crampy left lower quadrant abdominal pain. Her symptoms may be due to a flare of her IBS and she's been instructed to adhere to a low-fat high-fiber diet at this time. She's been instructed to decrease dairy in her diet. She will empirically be treated for possible small intestinal bacterial overgrowth with a course of Flagyl 250 mg 1 by mouth 3 times a day for 10 days. She will also be given a trial of Levsin 0.125 mg one sublingually every 8 hours when necessary. She has been instructed to notify the Coumadin clinic that she has been started on Flagyl and our office will send them a notification electronically as well. She will follow up in 3-4 weeks, sooner if needed.    Yasuko Lapage, Deloris Ping 08/26/2014, 8:13 PM  CC: [name], MD

## 2014-08-26 NOTE — Telephone Encounter (Signed)
Patient was seen today and placed on Flagyl. She has an appointment in your office Friday the 22nd.

## 2014-08-29 ENCOUNTER — Encounter: Payer: Self-pay | Admitting: *Deleted

## 2014-08-29 ENCOUNTER — Ambulatory Visit (INDEPENDENT_AMBULATORY_CARE_PROVIDER_SITE_OTHER)
Admission: RE | Admit: 2014-08-29 | Discharge: 2014-08-29 | Disposition: A | Discharge status: Home / Self Care | Payer: Medicare Other | Source: Ambulatory Visit | Attending: Physician Assistant | Admitting: Physician Assistant

## 2014-08-29 ENCOUNTER — Other Ambulatory Visit: Payer: Self-pay | Admitting: Physician Assistant

## 2014-08-29 ENCOUNTER — Telehealth: Payer: Self-pay

## 2014-08-29 ENCOUNTER — Telehealth: Payer: Self-pay | Admitting: Physician Assistant

## 2014-08-29 ENCOUNTER — Other Ambulatory Visit (INDEPENDENT_AMBULATORY_CARE_PROVIDER_SITE_OTHER): Payer: Medicare Other

## 2014-08-29 ENCOUNTER — Ambulatory Visit (INDEPENDENT_AMBULATORY_CARE_PROVIDER_SITE_OTHER): Payer: Medicare Other

## 2014-08-29 DIAGNOSIS — R197 Diarrhea, unspecified: Secondary | ICD-10-CM | POA: Diagnosis not present

## 2014-08-29 DIAGNOSIS — I4891 Unspecified atrial fibrillation: Secondary | ICD-10-CM | POA: Diagnosis not present

## 2014-08-29 DIAGNOSIS — Z5181 Encounter for therapeutic drug level monitoring: Secondary | ICD-10-CM | POA: Diagnosis not present

## 2014-08-29 LAB — CBC WITH DIFFERENTIAL/PLATELET
BASOS ABS: 0 10*3/uL (ref 0.0–0.1)
Basophils Relative: 0.7 % (ref 0.0–3.0)
EOS ABS: 0.2 10*3/uL (ref 0.0–0.7)
Eosinophils Relative: 3.6 % (ref 0.0–5.0)
HCT: 38.8 % (ref 36.0–46.0)
Hemoglobin: 12.7 g/dL (ref 12.0–15.0)
Lymphocytes Relative: 27.8 % (ref 12.0–46.0)
Lymphs Abs: 1.5 10*3/uL (ref 0.7–4.0)
MCHC: 32.7 g/dL (ref 30.0–36.0)
MCV: 92.6 fl (ref 78.0–100.0)
MONOS PCT: 10.2 % (ref 3.0–12.0)
Monocytes Absolute: 0.5 10*3/uL (ref 0.1–1.0)
NEUTROS PCT: 57.7 % (ref 43.0–77.0)
Neutro Abs: 3.1 10*3/uL (ref 1.4–7.7)
Platelets: 175 10*3/uL (ref 150.0–400.0)
RBC: 4.19 Mil/uL (ref 3.87–5.11)
RDW: 12.6 % (ref 11.5–15.5)
WBC: 5.3 10*3/uL (ref 4.0–10.5)

## 2014-08-29 LAB — URINALYSIS, ROUTINE W REFLEX MICROSCOPIC
Bilirubin Urine: NEGATIVE
KETONES UR: NEGATIVE
Nitrite: NEGATIVE
Specific Gravity, Urine: <=1.005 — AB (ref 1.000–1.030)
Total Protein, Urine: NEGATIVE
UROBILINOGEN UA: 0.2 (ref 0.0–1.0)
Urine Glucose: NEGATIVE
pH: 5.5 (ref 5.0–8.0)

## 2014-08-29 LAB — POCT INR: INR: 5.7

## 2014-08-29 NOTE — Telephone Encounter (Signed)
Patient given appointment and instructions. Labs in EPIC. Patient states she does not have any allergies.

## 2014-08-29 NOTE — Telephone Encounter (Signed)
Spoke with patient and she states she saw Cecille Rubin Hvozdovic, PA-C on Monday and was given Flagyl. She states it has not made her feel better. States she had diarrhea and chills again last night and this AM. She took Imodium twice and still had some diarrhea. She has not taken the Flagyl this AM. She did not take Levsin but she will try taking this. She will continue Flagyl. Explained the need to eat prior to taking Flagyl. She understands to call back if Levsin and Flagyl does not help diarrhea.

## 2014-08-29 NOTE — Telephone Encounter (Signed)
Labs in EPIC. Scheduled CT at [facility] CT at 3:30 PM today. Contrast at 1:30 and 2:30 PM. NPO 4 hours prior.

## 2014-08-29 NOTE — Telephone Encounter (Signed)
Please have pt sched for CT abd /pelvis. Recent BUN/Creat ok. See if she can get scan today. Repeat cbc with diff Provider, please review the patient's drug allergy history. Some issues need clarification. Urinalysis and urine c&S please.

## 2014-08-29 NOTE — Telephone Encounter (Signed)
Pt called states she saw PA with GI on Monday 08/26/14 and was started on Flagyl 250mg  TID.  Note was closed and sent to Dr Caryl Comes, Coumadin Clinic was never made aware of Flagyl start.  Spoke with pt advised Flagyl does interact with Coumadin and can increase INR, given pt's supra therapeutic INR last week of 15.6 changed appt from 7/22 to today 7/21.  Pt states diarrhea persists as well. Pt will come into clinic today for INR check.

## 2014-08-30 LAB — URINE CULTURE
COLONY COUNT: NO GROWTH
ORGANISM ID, BACTERIA: NO GROWTH

## 2014-09-02 ENCOUNTER — Other Ambulatory Visit: Payer: Self-pay | Admitting: Internal Medicine

## 2014-09-02 NOTE — Progress Notes (Signed)
Agree w/ Ms. Hvozdovic's note and mangement.  

## 2014-09-02 NOTE — Telephone Encounter (Signed)
Coumadin refill. Thank you for your time. 

## 2014-09-04 ENCOUNTER — Ambulatory Visit (INDEPENDENT_AMBULATORY_CARE_PROVIDER_SITE_OTHER): Payer: Medicare Other | Admitting: *Deleted

## 2014-09-04 DIAGNOSIS — Z5181 Encounter for therapeutic drug level monitoring: Secondary | ICD-10-CM

## 2014-09-04 DIAGNOSIS — I4891 Unspecified atrial fibrillation: Secondary | ICD-10-CM | POA: Diagnosis not present

## 2014-09-04 LAB — POCT INR: INR: 3.5

## 2014-09-09 ENCOUNTER — Encounter: Payer: Self-pay | Admitting: Physician Assistant

## 2014-09-09 ENCOUNTER — Ambulatory Visit (INDEPENDENT_AMBULATORY_CARE_PROVIDER_SITE_OTHER): Payer: Medicare Other | Admitting: Physician Assistant

## 2014-09-09 VITALS — BP 124/70 | HR 68 | Ht 66.0 in | Wt 131.5 lb

## 2014-09-09 DIAGNOSIS — K589 Irritable bowel syndrome without diarrhea: Secondary | ICD-10-CM | POA: Diagnosis not present

## 2014-09-09 DIAGNOSIS — R1310 Dysphagia, unspecified: Secondary | ICD-10-CM

## 2014-09-09 MED ORDER — PANTOPRAZOLE SODIUM 40 MG PO TBEC
40.0000 mg | DELAYED_RELEASE_TABLET | Freq: Every day | ORAL | Status: DC
Start: 2014-09-09 — End: 2015-04-07

## 2014-09-09 NOTE — Progress Notes (Signed)
Patient ID: Alyssa [name], female   DOB: 09-19-1924, 81 y.o.   MRN: [id number]     History of Present Illness: Alyssa [name] is a pleasant 81 year old female who was last seen here on July 18. She is known to Dr. Carlean Purl with a history of GERD, and IBS. She also has a history of atrial fibrillation for which she is on chronic anticoagulation with warfarin, hypothyroidism, osteoporosis, chronic renal insufficiency stage III, and she has had a pacemaker placed. She was seen here on July 18 with complaints of one or 2 mushy bowel movements daily. She was also having intermittent left lower quadrant crampy abdominal pain prior to defecation, alleviated with defecation. It was felt she was having a flare of her IBS and she was instructed to adhere to a high-fiber, low-fat diet. She was empirically treated for possible small intestinal bacterial overgrowth with Flagyl 250 mg 3 times a day for 10 days. After taking the Flagyl for a couple of days she called stating she had no relief and so she was sent for a CT of the abdomen and pelvis which revealed sigmoid diverticulosis without evidence of diverticulitis. She was instructed to continue her Flagyl and Levsin. She returns today in follow-up. She is now having a formed bowel movements on a daily basis. She has much less abdominal pain. She does report some minor cramping prior to defecation, alleviated with defecation. She is concerned about her weight loss. She has lost 16 pounds since January. She states she has been eating less because she was told her sugar was high. She also says she has been avoiding many foods that she liked in an effort to decrease flares of her IBS. She does report however that over the past 6 or 7 months she has been having dysphagia to dry foods and occasionally meats. She has episodes of dysphagia once or twice a week. She has had numerous episodes where she has had to spit food up because it won't go down. She does not feel food pools in her  throat but she occasionally coughs when eating. She denies any episodes of aspiration.   Past Medical History  Diagnosis Date  . Hypothyroidism   . Osteoporosis   . Renal insufficiency   . Thyroid cancer   . Hx of breast cancer   . Atrial fibrillation     pacemaker, chronic anticoag  . MVP (mitral valve prolapse)   . Left inguinal hernia     direct  . IBS (irritable bowel syndrome)   . GERD (gastroesophageal reflux disease)   . Hyperkalemia   . Internal hemorrhoids   . Diverticulosis     Past Surgical History  Procedure Laterality Date  . Thyroidectomy    . Mastectomy  1982    Bilateral  . Tonsillectomy    . Pacemaker insertion    . Upper gastrointestinal endoscopy  09/06/2000    normal  . Colonoscopy  10/28/2005    internal hemorrhoids, diverticulosis (same as in 2002 and random bxs negative then)   Family History  Problem Relation Age of Onset  . Arthritis Other   . Hypertension Other   . Stroke Mother   . Colon cancer Father 62   History  Substance Use Topics  . Smoking status: Former Smoker    Quit date: 02/08/1954  . Smokeless tobacco: Never Used  . Alcohol Use: No   Current Outpatient Prescriptions  Medication Sig Dispense Refill  . acetaminophen (TYLENOL ARTHRITIS PAIN) 650 MG CR tablet  Take 1,300 mg by mouth 2 (two) times daily.      . hyoscyamine (LEVSIN SL) 0.125 MG SL tablet Place 1 tablet (0.125 mg total) under the tongue every 8 (eight) hours as needed. 30 tablet 1  . levothyroxine (SYNTHROID, LEVOTHROID) 88 MCG tablet Take 1 tablet (88 mcg total) by mouth daily. 90 tablet 2  . loratadine (CLARITIN) 10 MG tablet Take 0.5-1 tablets (5-10 mg total) by mouth daily. 30 tablet 11  . metoprolol (LOPRESSOR) 50 MG tablet TAKE ONE-HALF TABLET BY MOUTH IN THE MORNING, AND TAKE ONE-HALF TABLET BY MOUTH IN THE EVENING. 90 tablet 11  . phytonadione (MEPHYTON) 5 MG tablet Take 1 tablet (5 mg total) by mouth once. Take 1 tablet by mouth NOW 1 tablet 0  . warfarin  (COUMADIN) 3 MG tablet TAKE AS DIRECTED BY COUMADIN CLINIC 30 tablet 3  . pantoprazole (PROTONIX) 40 MG tablet Take 1 tablet (40 mg total) by mouth daily. 30 tablet 6   No current facility-administered medications for this visit.   No Known Allergies    Review of Systems: Gen: Denies any fever, chills, sweats. Admits to 16 pounds weight loss since January CV: Denies chest pain, angina, palpitations, syncope, orthopnea, PND, peripheral edema, and claudication. Resp: Denies dyspnea at rest, dyspnea with exercise, cough, sputum, wheezing, coughing up blood, and pleurisy. GI: Denies vomiting blood, jaundice, and fecal incontinence.  Has dysphagia to dry foods. GU : Denies urinary burning, blood in urine, urinary frequency, urinary hesitancy, nocturnal urination, and urinary incontinence. MS: Denies joint pain, limitation of movement, and swelling, stiffness, low back pain, extremity pain. Denies muscle weakness, cramps, atrophy.  Derm: Denies rash, itching, dry skin, hives, moles, warts, or unhealing ulcers.  Psych: Denies depression, anxiety, memory loss, suicidal ideation, hallucinations, paranoia, and confusion. Heme: Denies bruising, bleeding, and enlarged lymph nodes. Neuro:  Denies any headaches, dizziness, paresthesia   Studies:   Ct Abdomen Pelvis Wo Contrast  08/29/2014   CLINICAL DATA:  Intermittent diarrhea x3 months, history of diverticulitis.  EXAM: CT ABDOMEN AND PELVIS WITHOUT CONTRAST  TECHNIQUE: Multidetector CT imaging of the abdomen and pelvis was performed following the standard protocol without IV contrast.  COMPARISON:  None.  FINDINGS: Lower chest:  Lung bases are clear.  Cardiomegaly.  Pacemaker leads, incompletely visualized.  Hepatobiliary: 5 mm cyst inferiorly in the medial segment left hepatic lobe (series 2/ image 28). Unenhanced liver is otherwise grossly unremarkable.  Gallbladder is unremarkable. No intrahepatic or extrahepatic ductal dilatation.  Pancreas: Within  normal limits.  Spleen: Within normal limits.  Adrenals/Urinary Tract: Adrenal glands are within normal limits.  Kidneys are within normal limits.  No renal, ureteral, or bladder calculi.  No hydronephrosis.  Bladder is within normal limits.  Stomach/Bowel: Stomach is within normal limits.  No evidence of bowel obstruction.  Normal appendix.  Sigmoid diverticulosis, without evidence of diverticulitis.  Vascular/Lymphatic: Atherosclerotic calcifications of the abdominal aorta and branch vessels.  No suspicious abdominopelvic lymphadenopathy.  Reproductive: Uterus is notable for a probable calcified right uterine body fibroid (series 2/ image 62).  No adnexal masses.  Other: Trace pelvic ascites.  1.9 x 2.3 cm fluid density lesion in the right retrocrural lesion, possibly reflecting a duplication cyst or lymphocele, likely benign.  Musculoskeletal: Degenerative changes of the visualized thoracolumbar spine.  Mild to moderate superior endplate compression fracture deformity at T12, age indeterminate. No retropulsion.  IMPRESSION: Sigmoid diverticulosis, without evidence of diverticulitis.  No evidence of bowel obstruction.  Normal appendix.  Trace pelvic ascites,  nonspecific.  Mild to moderate superior endplate compression fracture deformity at T12, age indeterminate. No retropulsion.   Electronically Signed   By: Julian Hy M.D.   On: 08/29/2014 17:02     Physical Exam: General: Pleasant, well developed female in no acute distress Head: Normocephalic and atraumatic Eyes:  sclerae anicteric, conjunctiva pink  Ears: Normal auditory acuity Lungs: Clear throughout to auscultation Heart: Regular rate and rhythm Abdomen: Soft, non distended, non-tender. No masses, no hepatomegaly. Normal bowel sounds Musculoskeletal: Symmetrical with no gross deformities  Extremities: No edema  Neurological: Alert oriented x 4, grossly nonfocal Psychological:  Alert and cooperative. Normal mood and affect  Assessment  and Recommendations: #1. IBS. Her symptoms of loose stools have improved after a course of Flagyl for possible small intestinal bacterial overgrowth. She will continue a high-fiber low-fat diet. She will use Levsin as needed.  #2 dysphagia. Patient has a history of GERD and has been on a PPI in the past but states she has been off PPIs for over a year. She will be given a trial of pantoprazole 40 mg by mouth every morning 30 minutes prior to breakfast. She will be scheduled for a barium swallow with tablet to evaluate for possible stricture, reflux, tertiary contractions, etc. She will follow up in one month, sooner if needed. If barium swallow is nonrevealing, we'll discuss possible role of endoscopy in light of recent weight loss. (Has had colonoscopies in 2002 in 2007 with no polyps. Recent CT report above.)        Myles Tavella, Vita Barley PA-C 09/09/2014,

## 2014-09-09 NOTE — Patient Instructions (Signed)
You have been scheduled for a Barium Esophogram at [facility] (1st floor of the hospital) on 8/2/2016at 10:30am. Please arrive 15 minutes prior to your appointment for registration. Make certain not to have anything to eat or drink 3 hours prior to your test. If you need to reschedule for any reason, please contact radiology at [phone] to do so. __________________________________________________________________ A barium swallow is an examination that concentrates on views of the esophagus. This tends to be a double contrast exam (barium and two liquids which, when combined, create a gas to distend the wall of the oesophagus) or single contrast (non-ionic iodine based). The study is usually tailored to your symptoms so a good history is essential. Attention is paid during the study to the form, structure and configuration of the esophagus, looking for functional disorders (such as aspiration, dysphagia, achalasia, motility and reflux) EXAMINATION You may be asked to change into a gown, depending on the type of swallow being performed. A radiologist and radiographer will perform the procedure. The radiologist will advise you of the type of contrast selected for your procedure and direct you during the exam. You will be asked to stand, sit or lie in several different positions and to hold a small amount of fluid in your mouth before being asked to swallow while the imaging is performed .In some instances you may be asked to swallow barium coated marshmallows to assess the motility of a solid food bolus. The exam can be recorded as a digital or video fluoroscopy procedure. POST PROCEDURE It will take 1-2 days for the barium to pass through your system. To facilitate this, it is important, unless otherwise directed, to increase your fluids for the next 24-48hrs and to resume your normal diet.  This test typically takes about 30 minutes to  perform. __________________________________________________________________________________   We have sent the following medications to your pharmacy for you to pick up at your convenience: Pantoprazole  Continue Florastor twice a day  Use Benefiber - 1 tablespoon daily in 8 ounces of water or juice  Please follow up with Cecille Rubin on 10/10/2014 at 9:45am

## 2014-09-10 ENCOUNTER — Ambulatory Visit (HOSPITAL_COMMUNITY)
Admission: RE | Admit: 2014-09-10 | Discharge: 2014-09-10 | Disposition: A | Discharge status: Home / Self Care | Payer: Medicare Other | Source: Ambulatory Visit | Attending: Internal Medicine | Admitting: Internal Medicine

## 2014-09-10 ENCOUNTER — Other Ambulatory Visit: Payer: Self-pay | Admitting: *Deleted

## 2014-09-10 ENCOUNTER — Other Ambulatory Visit (HOSPITAL_COMMUNITY): Payer: Self-pay | Admitting: Physician Assistant

## 2014-09-10 DIAGNOSIS — K589 Irritable bowel syndrome without diarrhea: Secondary | ICD-10-CM | POA: Diagnosis not present

## 2014-09-10 DIAGNOSIS — R634 Abnormal weight loss: Secondary | ICD-10-CM | POA: Insufficient documentation

## 2014-09-10 DIAGNOSIS — R1310 Dysphagia, unspecified: Secondary | ICD-10-CM | POA: Diagnosis present

## 2014-09-10 DIAGNOSIS — R1314 Dysphagia, pharyngoesophageal phase: Secondary | ICD-10-CM

## 2014-09-11 ENCOUNTER — Ambulatory Visit (INDEPENDENT_AMBULATORY_CARE_PROVIDER_SITE_OTHER): Payer: Medicare Other | Admitting: *Deleted

## 2014-09-11 ENCOUNTER — Encounter: Payer: Self-pay | Admitting: Internal Medicine

## 2014-09-11 ENCOUNTER — Ambulatory Visit (INDEPENDENT_AMBULATORY_CARE_PROVIDER_SITE_OTHER): Payer: Medicare Other | Admitting: Internal Medicine

## 2014-09-11 VITALS — BP 118/64 | HR 74 | Ht 66.0 in | Wt 134.0 lb

## 2014-09-11 DIAGNOSIS — I442 Atrioventricular block, complete: Secondary | ICD-10-CM | POA: Diagnosis not present

## 2014-09-11 DIAGNOSIS — I4821 Permanent atrial fibrillation: Secondary | ICD-10-CM

## 2014-09-11 DIAGNOSIS — I4891 Unspecified atrial fibrillation: Secondary | ICD-10-CM | POA: Diagnosis not present

## 2014-09-11 DIAGNOSIS — Z5181 Encounter for therapeutic drug level monitoring: Secondary | ICD-10-CM

## 2014-09-11 DIAGNOSIS — Z45018 Encounter for adjustment and management of other part of cardiac pacemaker: Secondary | ICD-10-CM

## 2014-09-11 DIAGNOSIS — I482 Chronic atrial fibrillation: Secondary | ICD-10-CM

## 2014-09-11 LAB — CUP PACEART INCLINIC DEVICE CHECK
Battery Impedance: 4568 Ohm
Battery Voltage: 2.66 V
Brady Statistic RV Percent Paced: 98 %
Lead Channel Impedance Value: 528 Ohm
Lead Channel Impedance Value: 67 Ohm
Lead Channel Pacing Threshold Amplitude: 1 V
Lead Channel Pacing Threshold Pulse Width: 0.4 ms
Lead Channel Setting Pacing Amplitude: 2.5 V
Lead Channel Setting Pacing Pulse Width: 0.4 ms
MDC IDC MSMT BATTERY REMAINING LONGEVITY: 9 mo
MDC IDC SESS DTM: 20160803151649
MDC IDC SET LEADCHNL RV SENSING SENSITIVITY: 2 mV

## 2014-09-11 LAB — POCT INR: INR: 2.8

## 2014-09-11 NOTE — Patient Instructions (Signed)
Medication Instructions:  Your physician recommends that you continue on your current medications as directed. Please refer to the Current Medication list given to you today.  Labwork: None ordered   Testing/Procedures: None ordered  Follow-Up: Monthly battery checks.  First check on 10/25/14.  Any Other Special Instructions Will Be Listed Below (If Applicable). Thank you for choosing [facility]!!

## 2014-09-11 NOTE — Progress Notes (Signed)
Patient Care Team: [name], MD as PCP - General [name], MD (Cardiology)   HPI  Alyssa [name] is a 81 y.o. female seen in followup for complete heart block. She is status post pacemaker implantation. She has atrial fibrillation which is permanent. She is taking and tolerating her Coumadin. There have been no intercurrent problems with exercise intolerance shortness of breath dyspnea or chest pain.   she is a struggle with irritable bowel but otherwise is doing well   she ended up on anticoagulation with an INR of greater than 10   Past Medical History  Diagnosis Date  . Hypothyroidism   . Osteoporosis   . Renal insufficiency   . Thyroid cancer   . Hx of breast cancer   . Atrial fibrillation     pacemaker, chronic anticoag  . MVP (mitral valve prolapse)   . Left inguinal hernia     direct  . IBS (irritable bowel syndrome)   . GERD (gastroesophageal reflux disease)   . Hyperkalemia   . Internal hemorrhoids   . Diverticulosis     Past Surgical History  Procedure Laterality Date  . Thyroidectomy    . Mastectomy  1982    Bilateral  . Tonsillectomy    . Pacemaker insertion    . Upper gastrointestinal endoscopy  09/06/2000    normal  . Colonoscopy  10/28/2005    internal hemorrhoids, diverticulosis (same as in 2002 and random bxs negative then)    Current Outpatient Prescriptions  Medication Sig Dispense Refill  . acetaminophen (TYLENOL ARTHRITIS PAIN) 650 MG CR tablet Take 1,300 mg by mouth 2 (two) times daily.      . hyoscyamine (LEVSIN SL) 0.125 MG SL tablet Place 1 tablet (0.125 mg total) under the tongue every 8 (eight) hours as needed. 30 tablet 1  . levothyroxine (SYNTHROID, LEVOTHROID) 88 MCG tablet Take 1 tablet (88 mcg total) by mouth daily. 90 tablet 2  . metoprolol (LOPRESSOR) 50 MG tablet TAKE ONE-HALF TABLET BY MOUTH IN THE MORNING, AND TAKE ONE-HALF TABLET BY MOUTH IN THE EVENING. 90 tablet 11  . metroNIDAZOLE (FLAGYL) 250 MG tablet   0  .  pantoprazole (PROTONIX) 40 MG tablet Take 1 tablet (40 mg total) by mouth daily. 30 tablet 6  . phytonadione (MEPHYTON) 5 MG tablet Take 1 tablet (5 mg total) by mouth once. Take 1 tablet by mouth NOW 1 tablet 0  . warfarin (COUMADIN) 3 MG tablet TAKE AS DIRECTED BY COUMADIN CLINIC 30 tablet 3   No current facility-administered medications for this visit.    No Known Allergies  Review of Systems negative except from HPI and PMH  Physical Exam BP 118/64 mmHg  Pulse 74  Ht 5\' 6"  (1.676 m)  Wt 134 lb (60.782 kg)  BMI 21.64 kg/m2 Well developed and well nourished in no acute distress HENT normal E scleral and icterus clear Neck Supple JVP flat; carotids brisk and full Clear to ausculation  Regular rate and rhythm, no murmurs gallops or rub Soft with active bowel sounds No clubbing cyanosis no Edema Alert and oriented, grossly normal motor and sensory function Skin Warm and Dry  ECG demonstrates ventricular pacing at 74 with underlying atrial fibrillation Intervals-/16/44 Axis left -85   Assessment and  Plan  Complete heart block  Atrial fibrillation    Pacemaker-[employer] The patient's device was interrogated.  The information was reviewed. No changes were made in the programming.    Continue anticoagulation  We have reviewed  the anticipated change in pacemaker behavior at the arrival of ERI. She will get oxygen saturation probe.  I wonder whether we should stop her metoprolol. In the context of complete heart block I don't know that his anymore value  And we will stop it

## 2014-09-18 ENCOUNTER — Encounter: Payer: Self-pay | Admitting: Internal Medicine

## 2014-09-19 ENCOUNTER — Ambulatory Visit (HOSPITAL_COMMUNITY)
Admission: RE | Admit: 2014-09-19 | Discharge: 2014-09-19 | Disposition: A | Discharge status: Home / Self Care | Payer: Medicare Other | Source: Ambulatory Visit | Attending: Physician Assistant | Admitting: Physician Assistant

## 2014-09-19 DIAGNOSIS — R1314 Dysphagia, pharyngoesophageal phase: Secondary | ICD-10-CM

## 2014-09-19 NOTE — Procedures (Signed)
Objective Swallowing Evaluation: Other (Comment)  Patient Details  Name: Alyssa [name] MRN: [id number] Date of Birth: 1924/06/09  Today's Date: 09/19/2014 Time: SLP Start Time (ACUTE ONLY): 1320-SLP Stop Time (ACUTE ONLY): 1348 SLP Time Calculation (min) (ACUTE ONLY): 28 min  Past Medical History:  Past Medical History  Diagnosis Date  . Hypothyroidism   . Osteoporosis   . Renal insufficiency   . Thyroid cancer   . Hx of breast cancer   . Atrial fibrillation     pacemaker, chronic anticoag  . MVP (mitral valve prolapse)   . Left inguinal hernia     direct  . IBS (irritable bowel syndrome)   . GERD (gastroesophageal reflux disease)   . Hyperkalemia   . Internal hemorrhoids   . Diverticulosis    Past Surgical History:  Past Surgical History  Procedure Laterality Date  . Thyroidectomy    . Mastectomy  1982    Bilateral  . Tonsillectomy    . Pacemaker insertion    . Upper gastrointestinal endoscopy  09/06/2000    normal  . Colonoscopy  10/28/2005    internal hemorrhoids, diverticulosis (same as in 2002 and random bxs negative then)   HPI:  Other Pertinent Information: 81 yo female referred for MBS due to aspiration on ba swallow.  PMH + for osteoporosis, hypothyroidism, MVP, h/o breast cancer, diverticulosis, IBS, afib, diverticulosis, thyroid cancer. Pt PSH + for thyroidectomy, masectomy, tonsillectomy, upper gastrointestinal endoscoy 08/2000.  Medication list includes protonix.  Esophagram 09/10/14 included aspiration of barium and remainder of study was dc'd due to aspiration.  Pt symptoms had included sensing food stuck in throat for six months of time and weight loss.  She reports these symptoms occur nearly daily since May 2016 but have improved since taking reflux medicine. 08/29/2014 CT negative for acute findings re: lung.   Subjective: pt present for mbs  Assessment / Plan / Recommendation CHL IP CLINICAL IMPRESSIONS 09/19/2014  Therapy Diagnosis Mild pharyngeal phase  dysphagia  Clinical Impression  Pt presents with mild pharyngeal phase dysphagia characterized by decreased epiglottic deflection resulting in vallecular residuals across all consistencies.  Trace laryngeal penetration of thin occured due to decreased timing of laryngeal closure.  Penetrates cleared with further swallows.  No aspiration observed.  Barium tablet taken with thin lodged at vallecular region with pt awareness and required multiple boluses of thin to aid transit.  Encouraged pt to take medications with plenty of water. Also recommended pt consume liquids throughout meal.    Recommend continue regular/thin diet with general aspiration precautions.  Using live monitor, educated pt to findings/recommendations.  Pt reports improvement with sensing food lodging in throat since taking a reflux medication within the last week.     She also reports xerostomia - slp provided compensation strategies in writing- including to start meals with liquids, add extra sauces to foods, etc.  Thanks for this referral.        CHL IP TREATMENT RECOMMENDATION 09/19/2014  Treatment Recommendations No treatment recommended at this time     CHL IP DIET RECOMMENDATION 09/19/2014  SLP Diet Recommendations Age appropriate regular solids;Thin  Liquid Administration via Cup, straw  Medication Administration Whole meds with liquid  Compensations Small sips/bites;Follow solids with liquid  Postural Changes and/or Swallow Maneuvers Stay up 30 minutes after meals     CHL IP OTHER RECOMMENDATIONS 09/19/2014  Recommended Consults (None)  Oral Care Recommendations Oral care BID  Other Recommendations Clarify dietary restrictions  CHL IP REASON FOR REFERRAL 09/19/2014  Reason for Referral Objectively evaluate swallowing function     CHL IP ORAL PHASE 09/19/2014  Oral Phase WFL      CHL IP PHARYNGEAL PHASE 09/19/2014  Pharyngeal Phase Impaired  Pharyngeal Comment pt barium tablet taken with thin  lodged at vallecular space - further bolus of pudding did not clear it - however 2 more boluses of thin aided transit into esophagus, pt did not overtly sense vallecular residuals but was able to clear with liquid and reflexive swallows -      CHL IP CERVICAL ESOPHAGEAL PHASE 09/19/2014  Cervical Esophageal Phase WFL    CHL IP GO 09/19/2014  Functional Assessment Tool Used MBS, clinical judgement  Functional Limitations Swallowing  Swallow Current Status (H7342) CI  Swallow Goal Status (A7681) CI  Swallow Discharge Status (L5726) [place], [facility] SLP [phone]

## 2014-09-20 ENCOUNTER — Telehealth: Payer: Self-pay | Admitting: Physician Assistant

## 2014-09-20 NOTE — Telephone Encounter (Signed)
Spoke with patient and she is to have a cavity filled and has to take antibiotics prior to this. She is asking if she should put it off for a few weeks until her bowels have been "straightened out for awhile." She will do this.

## 2014-09-26 ENCOUNTER — Ambulatory Visit (INDEPENDENT_AMBULATORY_CARE_PROVIDER_SITE_OTHER): Payer: Medicare Other | Admitting: *Deleted

## 2014-09-26 DIAGNOSIS — I4891 Unspecified atrial fibrillation: Secondary | ICD-10-CM | POA: Diagnosis not present

## 2014-09-26 DIAGNOSIS — Z5181 Encounter for therapeutic drug level monitoring: Secondary | ICD-10-CM | POA: Diagnosis not present

## 2014-09-26 LAB — POCT INR: INR: 1.4

## 2014-09-27 ENCOUNTER — Telehealth: Payer: Self-pay | Admitting: *Deleted

## 2014-09-27 NOTE — Telephone Encounter (Signed)
Patient left a message for me to call her. Unable to return her call.

## 2014-09-27 NOTE — Telephone Encounter (Signed)
Patient notified of recommendation. 

## 2014-09-27 NOTE — Telephone Encounter (Signed)
Spoke with patient and she is still having soft stools. She is completing Florastor today and is wondering if she should continue this. She also reports 1 lb weight loss since OV. She does have a f/u OV on 10/10/14 with Cecille Rubin Hvozdovic, PA-C

## 2014-09-27 NOTE — Telephone Encounter (Signed)
Can continue florastor indefinitiely.

## 2014-09-27 NOTE — Telephone Encounter (Signed)
Left a message for patient to call back. 

## 2014-09-28 NOTE — Progress Notes (Signed)
Agree w/ Ms. Hvozdovic's note and mangement.  

## 2014-10-07 ENCOUNTER — Ambulatory Visit (INDEPENDENT_AMBULATORY_CARE_PROVIDER_SITE_OTHER): Payer: Medicare Other

## 2014-10-07 DIAGNOSIS — I4891 Unspecified atrial fibrillation: Secondary | ICD-10-CM | POA: Diagnosis not present

## 2014-10-07 DIAGNOSIS — Z5181 Encounter for therapeutic drug level monitoring: Secondary | ICD-10-CM

## 2014-10-07 LAB — POCT INR: INR: 1.5

## 2014-10-10 ENCOUNTER — Ambulatory Visit (INDEPENDENT_AMBULATORY_CARE_PROVIDER_SITE_OTHER): Payer: Medicare Other | Admitting: Physician Assistant

## 2014-10-10 ENCOUNTER — Encounter: Payer: Self-pay | Admitting: Physician Assistant

## 2014-10-10 VITALS — BP 120/70 | HR 70 | Ht 66.0 in | Wt 133.1 lb

## 2014-10-10 DIAGNOSIS — K589 Irritable bowel syndrome without diarrhea: Secondary | ICD-10-CM | POA: Diagnosis not present

## 2014-10-10 DIAGNOSIS — K219 Gastro-esophageal reflux disease without esophagitis: Secondary | ICD-10-CM | POA: Diagnosis not present

## 2014-10-10 MED ORDER — HYOSCYAMINE SULFATE 0.125 MG SL SUBL: 0.1250 mg | SUBLINGUAL_TABLET | Freq: Three times a day (TID) | SUBLINGUAL | Status: DC | PRN

## 2014-10-10 NOTE — Progress Notes (Signed)
Patient ID: Alyssa [name], female   DOB: [birth year], 81 y.o.   MRN: [id number]     History of Present Illness: Alyssa [name] is a delightful 81 year old female who was last evaluated here in the office on 09/09/2014. At that time she was complaining of intermittent lower abdominal pain. Her discomfort occurred prior to defecation and was alleviated with defecation. She was empirically treated for possible small intestinal bacterial overgrowth with Flagyl 250 mg 3 times a day for 10 days. After taking the Flagyl for a few days she called stating she had no relief and so she was sent for a CT of the abdomen and pelvis which revealed sigmoid diverticulosis without evidence of diverticulitis. She was instructed to continue her Flagyl and Levsin. She is now having a formed bowel movement on a daily basis with no lower abdominal pain. At her last visit she was complaining of intermittent dysphagia to dry foods. She was sent for a swallowing study which revealed no aspiration. She was given a trial of pantoprazole 40 mg each morning prior to breakfast. She returns today in follow-up stating that the pantoprazole has provided significant relief. She is having no heartburn and has not had any further dysphagia while being on the pantoprazole. She has lost half a pound since her last visit, but states her appetite has significantly improved but she is purposely trying to limit carbohydrates and sugars.   Past Medical History  Diagnosis Date  . Hypothyroidism   . Osteoporosis   . Renal insufficiency   . Thyroid cancer   . Hx of breast cancer   . Atrial fibrillation     pacemaker, chronic anticoag  . MVP (mitral valve prolapse)   . Left inguinal hernia     direct  . IBS (irritable bowel syndrome)   . GERD (gastroesophageal reflux disease)   . Hyperkalemia   . Internal hemorrhoids   . Diverticulosis     Past Surgical History  Procedure Laterality Date  . Thyroidectomy    . Mastectomy  1982    Bilateral   . Tonsillectomy    . Pacemaker insertion    . Upper gastrointestinal endoscopy  09/06/2000    normal  . Colonoscopy  10/28/2005    internal hemorrhoids, diverticulosis (same as in 2002 and random bxs negative then)   Family History  Problem Relation Age of Onset  . Arthritis Other   . Hypertension Other   . Stroke Mother   . Colon cancer Father 32   Social History  Substance Use Topics  . Smoking status: Former Smoker    Quit date: 02/08/1954  . Smokeless tobacco: Never Used  . Alcohol Use: No   Current Outpatient Prescriptions  Medication Sig Dispense Refill  . acetaminophen (TYLENOL ARTHRITIS PAIN) 650 MG CR tablet Take 1,300 mg by mouth 2 (two) times daily.      . hyoscyamine (LEVSIN SL) 0.125 MG SL tablet Place 1 tablet (0.125 mg total) under the tongue every 8 (eight) hours as needed. For cramps 90 tablet 3  . levothyroxine (SYNTHROID, LEVOTHROID) 88 MCG tablet Take 1 tablet (88 mcg total) by mouth daily. 90 tablet 2  . pantoprazole (PROTONIX) 40 MG tablet Take 1 tablet (40 mg total) by mouth daily. 30 tablet 6  . warfarin (COUMADIN) 3 MG tablet TAKE AS DIRECTED BY COUMADIN CLINIC 30 tablet 3   No current facility-administered medications for this visit.   No Known Allergies   Review of Systems: Gen: Denies any fever, chills,  sweats, anorexia, fatigue, weakness, malaise, weight loss, and sleep disorder CV: Denies chest pain, angina, palpitations, syncope, orthopnea, PND, peripheral edema, and claudication. Resp: Denies dyspnea at rest, dyspnea with exercise, cough, sputum, wheezing, coughing up blood, and pleurisy. GI: Denies vomiting blood, jaundice, and fecal incontinence.   Denies dysphagia or odynophagia. GU : Denies urinary burning, blood in urine, urinary frequency, urinary hesitancy, nocturnal urination, and urinary incontinence. MS: Denies joint pain, limitation of movement, and swelling, stiffness, low back pain, extremity pain. Denies muscle weakness, cramps,  atrophy.  Derm: Denies rash, itching, dry skin, hives, moles, warts, or unhealing ulcers.  Psych: Denies depression, anxiety, memory loss, suicidal ideation, hallucinations, paranoia, and confusion. Heme: Denies bruising, bleeding, and enlarged lymph nodes. Neuro:  Denies any headaches, dizziness, paresthesia Endo:  Denies any problems with DM, thyroid, adrenal  LAB RESULTS:   Recent Labs  10/07/14 1458  INR 1.5     Studies:   Dg Op Swallowing Func-medicare/speech Path  09/19/2014   CLINICAL DATA:  81 year old female with aspiration detected on recent esophagram.  EXAM: MODIFIED BARIUM SWALLOW  TECHNIQUE: Different consistencies of barium were administered orally to the patient by the Speech Pathologist. Imaging of the pharynx was performed in the lateral projection.  FLUOROSCOPY TIME:  Fluoroscopy Time:  1 minutes 24 seconds.  Number of Acquired Images:  0  COMPARISON:  09/10/2014 esophagram.  FINDINGS: Thin liquid- Mild vallecular retention. Transient laryngeal penetration.  Nectar thick liquid- Mild vallecular retention. Transient laryngeal penetration.  Pure- mild vallecular retention.  No penetration or aspiration.  Barium tablet - A swallowed 13 mm barium tablet was retained transiently in the vallecula.  IMPRESSION: 1. Transient laryngeal penetration with thin and nectar thick liquids. No tracheobronchial aspiration. 2. Mild vallecular retention with a variety of barium formulations and with the barium tablet.  Please refer to the Speech Pathologists report for complete details and recommendations.   Electronically Signed   By: Ilona Sorrel M.D.   On: 09/19/2014 14:31     Physical Exam: BP 120/70 mmHg  Pulse 70  Ht 5\' 6"  (1.676 m)  Wt 133 lb 1.6 oz (60.374 kg)  BMI 21.49 kg/m2 General: Pleasant, well developed , female in no acute distress Head: Normocephalic and atraumatic Eyes:  sclerae anicteric, conjunctiva pink  Ears: Normal auditory acuity Lungs: Clear throughout to  auscultation Heart: Regular rate and rhythm Abdomen: Soft, non distended, non-tender. No masses, no hepatomegaly. Normal bowel sounds Musculoskeletal: Symmetrical with no gross deformities  Extremities: No edema  Neurological: Alert oriented x 4, grossly nonfocal Psychological:  Alert and cooperative. Normal mood and affect  Assessment and Recommendations: #1. IBS. Her bowel movements have significantly improved after a course of Flagyl. She will continue a high-fiber low-fat diet and will continue Levsin on an as-needed basis.  #2 GERD and dysphagia. Patient's symptoms of dysphagia and heartburn have totally resolved with daily use of pantoprazole. She will continue an antireflux regimen and daily pantoprazole.  She will follow up in 5-6 months, sooner if needed.        Aviva Wolfer, Vita Barley PA-C 10/10/2014,

## 2014-10-10 NOTE — Patient Instructions (Addendum)
We have sent the following medications to your pharmacy for you to pick up at your convenience: Levsin  Please continue taking Pantoprazle  Please follow up with Cecille Rubin in January 2017

## 2014-10-15 ENCOUNTER — Ambulatory Visit (INDEPENDENT_AMBULATORY_CARE_PROVIDER_SITE_OTHER): Payer: Medicare Other | Admitting: *Deleted

## 2014-10-15 ENCOUNTER — Telehealth: Payer: Self-pay | Admitting: Internal Medicine

## 2014-10-15 ENCOUNTER — Telehealth: Payer: Self-pay | Admitting: Cardiology

## 2014-10-15 DIAGNOSIS — Z950 Presence of cardiac pacemaker: Secondary | ICD-10-CM

## 2014-10-15 NOTE — Telephone Encounter (Signed)
LMOVM reminding pt to send remote transmission.   

## 2014-10-15 NOTE — Telephone Encounter (Signed)
New message ° ° ° ° °Pt checking to see if transmission was received °

## 2014-10-15 NOTE — Progress Notes (Signed)
Remote pacemaker transmission.   

## 2014-10-15 NOTE — Telephone Encounter (Signed)
Informed pt that her transmission was received.  

## 2014-10-15 NOTE — Progress Notes (Signed)
Agree w/ Ms. Hvozdovic's note and mangement.  

## 2014-10-18 ENCOUNTER — Ambulatory Visit (INDEPENDENT_AMBULATORY_CARE_PROVIDER_SITE_OTHER): Payer: Medicare Other | Admitting: *Deleted

## 2014-10-18 DIAGNOSIS — I4891 Unspecified atrial fibrillation: Secondary | ICD-10-CM

## 2014-10-18 DIAGNOSIS — Z5181 Encounter for therapeutic drug level monitoring: Secondary | ICD-10-CM | POA: Diagnosis not present

## 2014-10-18 LAB — POCT INR: INR: 2.6

## 2014-10-21 ENCOUNTER — Telehealth: Payer: Self-pay | Admitting: Physician Assistant

## 2014-10-21 NOTE — Telephone Encounter (Signed)
Left a message for patient to call back. 

## 2014-10-21 NOTE — Telephone Encounter (Signed)
Patient had diarrhea last night. She has taken Hyoscyamine. She thinks it is due to diet over the weekend. She will continue with Hyoscyamine and call if does not improve.

## 2014-10-23 LAB — CUP PACEART REMOTE DEVICE CHECK
Battery Remaining Longevity: 8 mo
Battery Voltage: 2.65 V
Brady Statistic RV Percent Paced: 99.4 %
Date Time Interrogation Session: 20160914111451
Lead Channel Pacing Threshold Amplitude: 0.75 V
Lead Channel Pacing Threshold Pulse Width: 0.4 ms
Lead Channel Setting Pacing Amplitude: 2.5 V
Lead Channel Setting Pacing Pulse Width: 0.4 ms
Lead Channel Setting Sensing Sensitivity: 2 mV
MDC IDC MSMT BATTERY IMPEDANCE: 4978 Ohm
MDC IDC MSMT LEADCHNL RV IMPEDANCE VALUE: 534 Ohm

## 2014-10-28 ENCOUNTER — Telehealth: Payer: Self-pay | Admitting: Physician Assistant

## 2014-10-28 DIAGNOSIS — R197 Diarrhea, unspecified: Secondary | ICD-10-CM

## 2014-10-28 NOTE — Telephone Encounter (Signed)
Lab in EPIC. Left a message for patient to call back. 

## 2014-10-28 NOTE — Telephone Encounter (Signed)
Check stool for c diff. Eliminate dairy and wheat for a week and see if it helps. Trial of phazyme as well.

## 2014-10-28 NOTE — Telephone Encounter (Signed)
Left a message for patient to call back. 

## 2014-10-28 NOTE — Telephone Encounter (Signed)
Spoke with patient and she states she is still having problems with diarrhea especially after she eats. She states she is having lots of gas, pain below belly button and the stool smells bad. States she does not feel bad. She is taking Hyoscyamine BID and it helps some. Please, advise.

## 2014-10-29 ENCOUNTER — Other Ambulatory Visit: Payer: Self-pay | Admitting: *Deleted

## 2014-10-29 NOTE — Telephone Encounter (Signed)
Patient notified of recommendations. 

## 2014-10-30 ENCOUNTER — Telehealth: Payer: Self-pay | Admitting: Physician Assistant

## 2014-10-30 ENCOUNTER — Other Ambulatory Visit: Payer: Medicare Other

## 2014-10-30 DIAGNOSIS — R197 Diarrhea, unspecified: Secondary | ICD-10-CM

## 2014-10-30 NOTE — Telephone Encounter (Signed)
Spoke with patient and she states she brought in the stool sample. States she has had 3 diarrhea stools today.

## 2014-10-31 ENCOUNTER — Other Ambulatory Visit (INDEPENDENT_AMBULATORY_CARE_PROVIDER_SITE_OTHER): Payer: Medicare Other

## 2014-10-31 ENCOUNTER — Encounter: Payer: Self-pay | Admitting: *Deleted

## 2014-10-31 ENCOUNTER — Telehealth: Payer: Self-pay | Admitting: Physician Assistant

## 2014-10-31 DIAGNOSIS — R197 Diarrhea, unspecified: Secondary | ICD-10-CM

## 2014-10-31 LAB — CBC WITH DIFFERENTIAL/PLATELET
BASOS ABS: 0 10*3/uL (ref 0.0–0.1)
Basophils Relative: 0.4 % (ref 0.0–3.0)
EOS PCT: 1.5 % (ref 0.0–5.0)
Eosinophils Absolute: 0.1 10*3/uL (ref 0.0–0.7)
HCT: 39.8 % (ref 36.0–46.0)
HEMOGLOBIN: 13.3 g/dL (ref 12.0–15.0)
Lymphocytes Relative: 27.1 % (ref 12.0–46.0)
Lymphs Abs: 1.3 10*3/uL (ref 0.7–4.0)
MCHC: 33.3 g/dL (ref 30.0–36.0)
MCV: 89.4 fl (ref 78.0–100.0)
Monocytes Absolute: 0.5 10*3/uL (ref 0.1–1.0)
Monocytes Relative: 10 % (ref 3.0–12.0)
Neutro Abs: 3 10*3/uL (ref 1.4–7.7)
Neutrophils Relative %: 61 % (ref 43.0–77.0)
Platelets: 194 10*3/uL (ref 150.0–400.0)
RBC: 4.45 Mil/uL (ref 3.87–5.11)
RDW: 13.5 % (ref 11.5–15.5)
WBC: 4.9 10*3/uL (ref 4.0–10.5)

## 2014-10-31 LAB — BASIC METABOLIC PANEL
BUN: 17 mg/dL (ref 6–23)
CO2: 26 mEq/L (ref 19–32)
Calcium: 9.2 mg/dL (ref 8.4–10.5)
Chloride: 103 mEq/L (ref 96–112)
Creatinine, Ser: 1.02 mg/dL (ref 0.40–1.20)
GFR: 54.3 mL/min — ABNORMAL LOW (ref >60.00)
Glucose, Bld: 95 mg/dL (ref 70–99)
POTASSIUM: 4.8 meq/L (ref 3.5–5.1)
Sodium: 137 mEq/L (ref 135–145)

## 2014-10-31 LAB — CLOSTRIDIUM DIFFICILE BY PCR

## 2014-10-31 NOTE — Telephone Encounter (Signed)
Imodium AD 1 3 times a day

## 2014-10-31 NOTE — Telephone Encounter (Signed)
She will come for labs today. Patient is only taking Levsin at this time.

## 2014-10-31 NOTE — Telephone Encounter (Signed)
Needs a BMET and CBC today please See that C diff is pending  What is she taking to help diarrhea??

## 2014-10-31 NOTE — Telephone Encounter (Signed)
Patient given recommendation. 

## 2014-10-31 NOTE — Progress Notes (Signed)
Quick Note:  Labs ok Try the Imodium and lets try to get her in with me or APP next 1-2 weeks ______

## 2014-10-31 NOTE — Telephone Encounter (Signed)
Spoke with Cecille Rubin Hvozdovic, PA-C and she thinks patient may need OV. No extender visits available. Dr. Carlean Purl, do you have any suggestions for this patient?

## 2014-10-31 NOTE — Telephone Encounter (Signed)
Spoke with patient and she is getting worse. Eating bland foods and using Levsin every 8 hours. Having watery diarrhea. She is taking in lots of fluids. Two diarrhea stools already today.

## 2014-11-07 ENCOUNTER — Ambulatory Visit (INDEPENDENT_AMBULATORY_CARE_PROVIDER_SITE_OTHER): Payer: Medicare Other | Admitting: *Deleted

## 2014-11-07 DIAGNOSIS — Z5181 Encounter for therapeutic drug level monitoring: Secondary | ICD-10-CM

## 2014-11-07 DIAGNOSIS — I4891 Unspecified atrial fibrillation: Secondary | ICD-10-CM | POA: Diagnosis not present

## 2014-11-07 LAB — POCT INR: INR: 3.7

## 2014-11-11 ENCOUNTER — Telehealth: Payer: Self-pay | Admitting: *Deleted

## 2014-11-11 NOTE — Telephone Encounter (Signed)
Patient called to inform us that she had hit her right arm while getting out of the car and has a bruise. Educated on bruising while taking Coumadin and she verbalized understanding.  She states the bruise does look better but wanted to inform us.  Advised to call back if it does not start to look better or if she has any other issues and she verbalized understanding.

## 2014-11-12 ENCOUNTER — Encounter: Payer: Self-pay | Admitting: Physician Assistant

## 2014-11-12 ENCOUNTER — Ambulatory Visit (INDEPENDENT_AMBULATORY_CARE_PROVIDER_SITE_OTHER): Payer: Medicare Other | Admitting: Physician Assistant

## 2014-11-12 VITALS — BP 134/54 | HR 68 | Ht 66.0 in | Wt 131.0 lb

## 2014-11-12 DIAGNOSIS — K589 Irritable bowel syndrome without diarrhea: Secondary | ICD-10-CM | POA: Diagnosis not present

## 2014-11-12 DIAGNOSIS — K219 Gastro-esophageal reflux disease without esophagitis: Secondary | ICD-10-CM | POA: Diagnosis not present

## 2014-11-12 MED ORDER — VSL#3 PO CAPS: ORAL_CAPSULE | ORAL | Status: DC

## 2014-11-12 NOTE — Telephone Encounter (Signed)
Nurse spoke to pt. See other phone note on 10-31-14.

## 2014-11-12 NOTE — Patient Instructions (Signed)
You can use Imodium as needed for diarrhea.  Start phazyme over the counter 1-2 capsules as needed for gas, no more than 2 capsules daily.  We have sent the following medications to your pharmacy for you to pick up at your convenience: VSL #3 probiotic.  Your follow up appointment with Dr. Carlean Purl is for 01/13/15 at 9:45am.

## 2014-11-12 NOTE — Progress Notes (Signed)
Patient ID: Alyssa [name], female   DOB: 11-Dec-1924, 81 y.o.   MRN: [id number]     History of Present Illness: Alyssa [name]  Is a delightful 81 year old female who was last seen here or in early September. She has a history of IBS and prior to that visit had been given a course of Flagyl for intestinal overgrowth. She had relief of some of her symptoms and was urged to continue a high-fiber low-fat diet and use Levsin on an as needed basis. At that visit she also reported that her prior symptoms of dysphagia and heartburn had been totally resolved with daily use of pantoprazole. Shortly after her last visit she called with reports of diarrhea that occurred after a night of dietary indiscretions. She was advised to use hyoscyamine. She called a week later and states that she was having problems with urgent bowel movements after she eats, associated with gas and bloating. She didn't feel bad but she was concerned about the symptoms. She reported that use of hyoscyamine was providing some relief. A stool for C. Difficile was checked and was negative. She was advised to eliminate dairy and wheat for a week which she did, and it did not make a difference in her symptoms. She was urged to try Phazyme which she is not sure if it helped. She called several days later and  Reported that she had had 3 mushy bowel movements in a day. She was instructed to use Imodium. With use of Imodium she has one or 2 soft formed bowel movements daily. She only uses the Imodium for 1 day. She now says she is afraid to eat because she is afraid to get diarrhea. She continues to complain of gas and bloating.   Past Medical History  Diagnosis Date  . Hypothyroidism   . Osteoporosis   . Renal insufficiency   . Thyroid cancer ([place])   . Hx of breast cancer   . Atrial fibrillation ([place])     pacemaker, chronic anticoag  . MVP (mitral valve prolapse)   . Left inguinal hernia     direct  . IBS (irritable bowel syndrome)   . GERD  (gastroesophageal reflux disease)   . Hyperkalemia   . Internal hemorrhoids   . Diverticulosis     Past Surgical History  Procedure Laterality Date  . Thyroidectomy    . Mastectomy  1982    Bilateral  . Tonsillectomy    . Pacemaker insertion    . Upper gastrointestinal endoscopy  09/06/2000    normal  . Colonoscopy  10/28/2005    internal hemorrhoids, diverticulosis (same as in 2002 and random bxs negative then)   Family History  Problem Relation Age of Onset  . Arthritis Other   . Hypertension Other   . Stroke Mother   . Colon cancer Father 61   Social History  Substance Use Topics  . Smoking status: Former Smoker    Quit date: 02/08/1954  . Smokeless tobacco: Never Used  . Alcohol Use: No   Current Outpatient Prescriptions  Medication Sig Dispense Refill  . acetaminophen (TYLENOL ARTHRITIS PAIN) 650 MG CR tablet Take 1,300 mg by mouth 2 (two) times daily.      . hyoscyamine (LEVSIN SL) 0.125 MG SL tablet Place 1 tablet (0.125 mg total) under the tongue every 8 (eight) hours as needed. For cramps 90 tablet 3  . levothyroxine (SYNTHROID, LEVOTHROID) 88 MCG tablet Take 1 tablet (88 mcg total) by mouth daily. 90 tablet 2  .  pantoprazole (PROTONIX) 40 MG tablet Take 1 tablet (40 mg total) by mouth daily. 30 tablet 6  . warfarin (COUMADIN) 3 MG tablet TAKE AS DIRECTED BY COUMADIN CLINIC 30 tablet 3  . Probiotic Product (VSL#3) CAPS Take one capsule daily x 1 month then one capsule every other day x 1 month, then one capsule by mouth 2-3 x a week 30 capsule 2   No current facility-administered medications for this visit.   No Known Allergies   Review of Systems: Gen: Denies any fever, chills, sweats, anorexia, fatigue, weakness, malaise, weight loss, and sleep disorder CV: Denies chest pain, angina, palpitations, syncope, orthopnea, PND, peripheral edema, and claudication. Resp: Denies dyspnea at rest, dyspnea with exercise, cough, sputum, wheezing, coughing up blood, and  pleurisy. GI: Denies vomiting blood, jaundice, and fecal incontinence.   Denies dysphagia or odynophagia. GU : Denies urinary burning, blood in urine, urinary frequency, urinary hesitancy, nocturnal urination, and urinary incontinence. MS: Denies joint pain, limitation of movement, and swelling, stiffness, low back pain, extremity pain. Denies muscle weakness, cramps, atrophy.  Derm: Denies rash, itching, dry skin, hives, moles, warts, or unhealing ulcers.  Psych: Denies depression, anxiety, memory loss, suicidal ideation, hallucinations, paranoia, and confusion. Heme: Denies bruising, bleeding, and enlarged lymph nodes. Neuro:  Denies any headaches, dizziness, paresthesia Endo:  Denies any problems with DM, thyroid, adrenal    Physical Exam: BP 134/54 mmHg  Pulse 68  Ht 5\' 6"  (1.676 m)  Wt 131 lb (59.421 kg)  BMI 21.15 kg/m2 General: Pleasant, well developed ,female in no acute distress Head: Normocephalic and atraumatic Eyes:  sclerae anicteric, conjunctiva pink  Ears: Normal auditory acuity Lungs: Clear throughout to auscultation Heart: Regular rate and rhythm Abdomen: Soft, non distended, non-tender. No masses, no hepatomegaly. Normal bowel sounds Musculoskeletal: Symmetrical with no gross deformities  Extremities: No edema  Neurological: Alert oriented x 4, grossly nonfocal Psychological:  Alert and cooperative. Normal mood and affect  Assessment and Recommendations:   #1. IBS. She's been instructed to use Imodium as needed. She's been instructed that she can use Imodium before she leaves the house if she no she is going to breakfast or lunch with her friends, as these are typically the episodes where she will have immediate postprandial diarrhea. She was instructed again to try Phazyme 1-2 capfuls daily as needed for gas. She will also be given a trial of VSL112 billion , 1 capsule daily for one month, then 1 capsule daily every other day for a month, then 1 capsule 2-3 times  per week. She will return for follow-up in 6 weeks, sooner if needed.     #2. GERD. She is doing nicely on daily use of pantoprazole and will continue this regimen.       [name], Vita Barley PA-C 11/12/2014,

## 2014-11-14 ENCOUNTER — Encounter: Payer: Self-pay | Admitting: Internal Medicine

## 2014-11-14 ENCOUNTER — Ambulatory Visit: Payer: Medicare Other | Admitting: *Deleted

## 2014-11-15 LAB — CUP PACEART REMOTE DEVICE CHECK
Battery Impedance: 5761 Ohm
Lead Channel Impedance Value: 67 Ohm
Lead Channel Pacing Threshold Amplitude: 0.75 V
Lead Channel Pacing Threshold Pulse Width: 0.4 ms
Lead Channel Setting Sensing Sensitivity: 2 mV
MDC IDC MSMT BATTERY REMAINING LONGEVITY: 5 mo
MDC IDC MSMT BATTERY VOLTAGE: 2.63 V
MDC IDC MSMT LEADCHNL RV IMPEDANCE VALUE: 541 Ohm
MDC IDC SESS DTM: 20161006114302
MDC IDC SET LEADCHNL RV PACING AMPLITUDE: 2.5 V
MDC IDC SET LEADCHNL RV PACING PULSEWIDTH: 0.4 ms
MDC IDC STAT BRADY RV PERCENT PACED: 99 %

## 2014-11-16 NOTE — Progress Notes (Signed)
Agree w/ Ms. Hvozdovic's note and mangement.  

## 2014-11-18 ENCOUNTER — Ambulatory Visit (INDEPENDENT_AMBULATORY_CARE_PROVIDER_SITE_OTHER): Payer: Medicare Other | Admitting: Internal Medicine

## 2014-11-18 ENCOUNTER — Encounter: Payer: Self-pay | Admitting: Internal Medicine

## 2014-11-18 VITALS — BP 122/70 | HR 89 | Temp 98.4°F | Resp 16 | Ht 66.0 in | Wt 132.4 lb

## 2014-11-18 DIAGNOSIS — J302 Other seasonal allergic rhinitis: Secondary | ICD-10-CM | POA: Diagnosis not present

## 2014-11-18 DIAGNOSIS — Z23 Encounter for immunization: Secondary | ICD-10-CM

## 2014-11-18 MED ORDER — FLUTICASONE PROPIONATE 50 MCG/ACT NA SUSP: 2.0000 | Freq: Every day | NASAL | Status: DC

## 2014-11-18 NOTE — Patient Instructions (Signed)
We have sent in flonase (fluticasone) to the [facility] on [address]. This is a nose spray for the sinuses that should help. Use 2 sprays in each nostril once a day for the next 1-2 weeks.   We have given you the flu shot today and you do not need any antibiotics.   Allergic Rhinitis Allergic rhinitis is when the mucous membranes in the nose respond to allergens. Allergens are particles in the air that cause your body to have an allergic reaction. This causes you to release allergic antibodies. Through a chain of events, these eventually cause you to release histamine into the blood stream. Although meant to protect the body, it is this release of histamine that causes your discomfort, such as frequent sneezing, congestion, and an itchy, runny nose.  CAUSES Seasonal allergic rhinitis (hay fever) is caused by pollen allergens that may come from grasses, trees, and weeds. Year-round allergic rhinitis (perennial allergic rhinitis) is caused by allergens such as house dust mites, pet dander, and mold spores. SYMPTOMS  Nasal stuffiness (congestion).  Itchy, runny nose with sneezing and tearing of the eyes. DIAGNOSIS Your health care provider can help you determine the allergen or allergens that trigger your symptoms. If you and your health care provider are unable to determine the allergen, skin or blood testing may be used. Your health care provider will diagnose your condition after taking your health history and performing a physical exam. Your health care provider may assess you for other related conditions, such as asthma, pink eye, or an ear infection. TREATMENT Allergic rhinitis does not have a cure, but it can be controlled by:  Medicines that block allergy symptoms. These may include allergy shots, nasal sprays, and oral antihistamines.  Avoiding the allergen. Hay fever may often be treated with antihistamines in pill or nasal spray forms. Antihistamines block the effects of histamine. There  are over-the-counter medicines that may help with nasal congestion and swelling around the eyes. Check with your health care provider before taking or giving this medicine. If avoiding the allergen or the medicine prescribed do not work, there are many new medicines your health care provider can prescribe. Stronger medicine may be used if initial measures are ineffective. Desensitizing injections can be used if medicine and avoidance does not work. Desensitization is when a patient is given ongoing shots until the body becomes less sensitive to the allergen. Make sure you follow up with your health care provider if problems continue. HOME CARE INSTRUCTIONS It is not possible to completely avoid allergens, but you can reduce your symptoms by taking steps to limit your exposure to them. It helps to know exactly what you are allergic to so that you can avoid your specific triggers. SEEK MEDICAL CARE IF:  You have a fever.  You develop a cough that does not stop easily (persistent).  You have shortness of breath.  You start wheezing.  Symptoms interfere with normal daily activities.   This information is not intended to replace advice given to you by your health care provider. Make sure you discuss any questions you have with your health care provider.   Document Released: 10/20/2000 Document Revised: 02/15/2014 Document Reviewed: 10/02/2012 Elsevier Interactive Patient Education [employer].

## 2014-11-18 NOTE — Progress Notes (Signed)
   Subjective:    Patient ID: Alyssa [name], female    DOB: [birth year], 81 y.o.   MRN: [id number]  HPI The patient is an 81 YO female coming in for sinus congestion. She has been struggling with it for 6 weeks off and on. She was feeling better the last week or so but is having some pain in her left ear. Denies fevers or chills. Is not taking any allergy medicine over the counter. Using saline nasal spray which is somewhat helpful.   Review of Systems  Constitutional: Negative for fever, diaphoresis, activity change, appetite change and unexpected weight change.  HENT: Positive for congestion, ear pain, postnasal drip and sinus pressure. Negative for dental problem, ear discharge, hearing loss, rhinorrhea, sore throat and trouble swallowing.   Respiratory: Negative.   Cardiovascular: Negative.   Gastrointestinal: Negative.   Neurological: Negative.       Objective:   Physical Exam  Constitutional: She appears well-developed and well-nourished.  HENT:  Head: Normocephalic and atraumatic.  Right Ear: External ear normal.  Mouth/Throat: Oropharynx is clear and moist.  Left ear with minimal erythema of the canal, bilateral TM normal.   Eyes: EOM are normal.  Neck: Normal range of motion.  Cardiovascular: Normal rate and regular rhythm.   Pulmonary/Chest: Effort normal and breath sounds normal. No respiratory distress. She has no wheezes. She has no rales.  Abdominal: Soft. She exhibits no distension. There is no tenderness. There is no rebound.  Musculoskeletal: She exhibits no edema.  Neurological: Coordination normal.  Skin: Skin is warm and dry.   Filed Vitals:   11/18/14 1109  BP: 122/70  Pulse: 89  Temp: 98.4 F (36.9 C)  TempSrc: Oral  Resp: 16  Height: 5\' 6"  (1.676 m)  Weight: 132 lb 6.4 oz (60.056 kg)  SpO2: 98%      Assessment & Plan:  Flu shot given at visit.

## 2014-11-18 NOTE — Assessment & Plan Note (Signed)
No indication for antibiotic today, rx for flonase. Flu shot given at visit. She can continue with the saline nose spray as needed as well.

## 2014-11-18 NOTE — Progress Notes (Signed)
Pre visit review using our clinic review tool, if applicable. No additional management support is needed unless otherwise documented below in the visit note. 

## 2014-11-20 ENCOUNTER — Encounter: Payer: Self-pay | Admitting: Cardiology

## 2014-11-21 ENCOUNTER — Ambulatory Visit (INDEPENDENT_AMBULATORY_CARE_PROVIDER_SITE_OTHER): Payer: Medicare Other | Admitting: *Deleted

## 2014-11-21 DIAGNOSIS — Z5181 Encounter for therapeutic drug level monitoring: Secondary | ICD-10-CM

## 2014-11-21 DIAGNOSIS — I4891 Unspecified atrial fibrillation: Secondary | ICD-10-CM

## 2014-11-21 LAB — POCT INR: INR: 2.9

## 2014-12-02 ENCOUNTER — Encounter: Payer: Self-pay | Admitting: Internal Medicine

## 2014-12-12 ENCOUNTER — Ambulatory Visit (INDEPENDENT_AMBULATORY_CARE_PROVIDER_SITE_OTHER): Payer: Medicare Other | Admitting: *Deleted

## 2014-12-12 DIAGNOSIS — I4891 Unspecified atrial fibrillation: Secondary | ICD-10-CM

## 2014-12-12 DIAGNOSIS — Z5181 Encounter for therapeutic drug level monitoring: Secondary | ICD-10-CM

## 2014-12-12 LAB — POCT INR: INR: 2

## 2014-12-16 ENCOUNTER — Telehealth: Payer: Self-pay | Admitting: Cardiology

## 2014-12-16 ENCOUNTER — Ambulatory Visit (INDEPENDENT_AMBULATORY_CARE_PROVIDER_SITE_OTHER): Payer: Medicare Other | Admitting: *Deleted

## 2014-12-16 DIAGNOSIS — I442 Atrioventricular block, complete: Secondary | ICD-10-CM | POA: Diagnosis not present

## 2014-12-16 NOTE — Telephone Encounter (Signed)
LMOVM reminding pt to send remote transmission.   

## 2014-12-16 NOTE — Progress Notes (Signed)
Remote pacemaker transmission.   

## 2014-12-17 ENCOUNTER — Encounter: Payer: Self-pay | Admitting: Cardiology

## 2014-12-17 LAB — CUP PACEART REMOTE DEVICE CHECK
Battery Remaining Longevity: 2 mo
Battery Voltage: 2.6 V
Brady Statistic RV Percent Paced: 99 %
Implantable Lead Implant Date: 20000921
Implantable Lead Location: 753859
Implantable Lead Location: 753860
Lead Channel Impedance Value: 67 Ohm
Lead Channel Setting Pacing Amplitude: 2.5 V
Lead Channel Setting Pacing Pulse Width: 0.4 ms
Lead Channel Setting Sensing Sensitivity: 2 mV
MDC IDC LEAD IMPLANT DT: 20000921
MDC IDC MSMT BATTERY IMPEDANCE: 6507 Ohm
MDC IDC MSMT LEADCHNL RV IMPEDANCE VALUE: 517 Ohm
MDC IDC SESS DTM: 20161107180045

## 2014-12-18 ENCOUNTER — Encounter: Payer: Self-pay | Admitting: Cardiology

## 2014-12-21 ENCOUNTER — Other Ambulatory Visit: Payer: Self-pay | Admitting: Internal Medicine

## 2015-01-09 ENCOUNTER — Ambulatory Visit (INDEPENDENT_AMBULATORY_CARE_PROVIDER_SITE_OTHER): Payer: Medicare Other

## 2015-01-09 DIAGNOSIS — Z5181 Encounter for therapeutic drug level monitoring: Secondary | ICD-10-CM | POA: Diagnosis not present

## 2015-01-09 DIAGNOSIS — I4891 Unspecified atrial fibrillation: Secondary | ICD-10-CM | POA: Diagnosis not present

## 2015-01-09 LAB — POCT INR: INR: 2

## 2015-01-13 ENCOUNTER — Encounter: Payer: Self-pay | Admitting: Internal Medicine

## 2015-01-13 ENCOUNTER — Ambulatory Visit (INDEPENDENT_AMBULATORY_CARE_PROVIDER_SITE_OTHER): Payer: Medicare Other | Admitting: Internal Medicine

## 2015-01-13 VITALS — BP 140/70 | HR 72 | Ht 66.0 in | Wt 132.5 lb

## 2015-01-13 DIAGNOSIS — K589 Irritable bowel syndrome without diarrhea: Secondary | ICD-10-CM | POA: Diagnosis not present

## 2015-01-13 NOTE — Patient Instructions (Signed)
   Glad things are ok. You may stop Benfiber. Try to finish out the VSL #3 and resume as needed.  Call back as needed.  I appreciate the opportunity to care for you  Gatha Mayer, MD, [facility]

## 2015-01-13 NOTE — Progress Notes (Signed)
   Subjective:    Patient ID: Alyssa [name], female    DOB: 07-31-1924, 81 y.o.   MRN: [id number] Cc: f/u diarrhea/IBS HPI Was seen by Alinda Dooms PA-C several weeks ago 11/12/2014 note reviewed Managing on current regimen of intermittent VSL #3, prn loperamide and hyoscyamine. Has lost weight but levelled off. Not sure benefiber helps. Avoiding roughage.  Wt Readings from Last 3 Encounters:  01/13/15 132 lb 8 oz (60.102 kg)  11/18/14 132 lb 6.4 oz (60.056 kg)  11/12/14 131 lb (59.421 kg)   Medications, allergies, past medical history, past surgical history, family history and social history are reviewed and updated in the EMR.  Review of Systems As above    Objective:   Physical Exam  BP 140/70 mmHg  Pulse 72  Ht 5\' 6"  (1.676 m)  Wt 132 lb 8 oz (60.102 kg)  BMI 21.40 kg/m2     Assessment & Plan:   IBS (irritable bowel syndrome)  Improved Dc Benefiber Try to eventually stop VSL #3 Continue prn hyoscyamine and Imodium She had lost weight but has leveled off - she is 89 so reserve endoscopic evaluation for clear need RTC prn  15 minutes time spent with patient > half in counseling coordination of care

## 2015-01-16 ENCOUNTER — Ambulatory Visit (INDEPENDENT_AMBULATORY_CARE_PROVIDER_SITE_OTHER): Payer: Medicare Other | Admitting: *Deleted

## 2015-01-16 DIAGNOSIS — Z950 Presence of cardiac pacemaker: Secondary | ICD-10-CM

## 2015-01-17 ENCOUNTER — Telehealth: Payer: Self-pay | Admitting: Cardiology

## 2015-01-17 NOTE — Telephone Encounter (Signed)
Spoke w/ pt and informed her that her device has reached ERI. Informed her that a schedule will be contact w/ her to schedule an appt w/ MD / PA / NP to discuss the procedure. She verbalized understanding.

## 2015-01-17 NOTE — Progress Notes (Signed)
Remote pacemaker transmission.   

## 2015-01-17 NOTE — Telephone Encounter (Signed)
LMOVM for pt to return my call. Pt PPM has reached ERI.

## 2015-01-24 LAB — CUP PACEART REMOTE DEVICE CHECK
Battery Impedance: 7399 Ohm
Battery Remaining Longevity: 1 mo
Battery Voltage: 2.6 V
Brady Statistic RV Percent Paced: 99 %
Implantable Lead Implant Date: 20000921
Implantable Lead Implant Date: 20000921
Implantable Lead Location: 753859
Lead Channel Impedance Value: 533 Ohm
Lead Channel Setting Pacing Amplitude: 2.5 V
Lead Channel Setting Pacing Pulse Width: 0.4 ms
MDC IDC LEAD LOCATION: 753860
MDC IDC MSMT LEADCHNL RA IMPEDANCE VALUE: 67 Ohm
MDC IDC SESS DTM: 20161208154233
MDC IDC SET LEADCHNL RV SENSING SENSITIVITY: 2 mV

## 2015-02-06 ENCOUNTER — Ambulatory Visit (INDEPENDENT_AMBULATORY_CARE_PROVIDER_SITE_OTHER): Payer: Medicare Other | Admitting: *Deleted

## 2015-02-06 DIAGNOSIS — I4891 Unspecified atrial fibrillation: Secondary | ICD-10-CM | POA: Diagnosis not present

## 2015-02-06 DIAGNOSIS — Z5181 Encounter for therapeutic drug level monitoring: Secondary | ICD-10-CM

## 2015-02-06 LAB — POCT INR: INR: 1.6

## 2015-02-07 ENCOUNTER — Other Ambulatory Visit: Payer: Self-pay | Admitting: Internal Medicine

## 2015-02-11 ENCOUNTER — Other Ambulatory Visit: Payer: Self-pay

## 2015-02-11 MED ORDER — WARFARIN SODIUM 3 MG PO TABS: ORAL_TABLET | ORAL | Status: DC

## 2015-02-13 ENCOUNTER — Encounter: Payer: Self-pay | Admitting: *Deleted

## 2015-02-13 ENCOUNTER — Encounter: Payer: Self-pay | Admitting: Nurse Practitioner

## 2015-02-13 ENCOUNTER — Ambulatory Visit (INDEPENDENT_AMBULATORY_CARE_PROVIDER_SITE_OTHER): Payer: Medicare Other | Admitting: Nurse Practitioner

## 2015-02-13 VITALS — BP 140/72 | HR 72 | Ht 66.0 in | Wt 133.0 lb

## 2015-02-13 DIAGNOSIS — I442 Atrioventricular block, complete: Secondary | ICD-10-CM | POA: Diagnosis not present

## 2015-02-13 DIAGNOSIS — I4821 Permanent atrial fibrillation: Secondary | ICD-10-CM

## 2015-02-13 DIAGNOSIS — I482 Chronic atrial fibrillation: Secondary | ICD-10-CM

## 2015-02-13 NOTE — Patient Instructions (Addendum)
Medication Instructions:   Alyssa [name] EMDD   If you need a refill on your cardiac medications before your next appointment, please call your pharmacy.  Labwork:  RETURN  ON 02/20/15  FOR PRE LABS    Testing/Procedures:   SEE LETTER  FOR CHANGE OUT   Follow-Up:  10 DAY WOUND CHECK AFTER:   1  /18 /17  31 DAY FOLLOW UP AFTER:     1 / 18 /17    Any Other Special Instructions Will Be Listed Below (If Applicable).

## 2015-02-13 NOTE — Progress Notes (Signed)
Electrophysiology Office Note Date: 02/13/2015  ID:  Alyssa [name], DOB February 03, 1925, MRN [id number]  PCP: [name], MD Electrophysiologist: Caryl Comes  CC: Pacemaker at [place] is a 82 y.o. female seen today for Dr Caryl Comes.  Her pacemaker has been found to be at [facility] and she presents today to discuss pacemaker generator change.  Since last being seen in our clinic, the patient reports doing very well.  She denies chest pain, palpitations, dyspnea, PND, orthopnea, nausea, vomiting, dizziness, syncope, edema, weight gain, or early satiety. She has noticed some decrease in exercise tolerance since reaching ERI.   Device History: MDT dual chamber PPM implanted 2000 for CHB; gen change 2007   Past Medical History  Diagnosis Date  . Hypothyroidism   . Osteoporosis   . Renal insufficiency   . Thyroid cancer ([place])   . Hx of breast cancer   . Atrial fibrillation ([place])     pacemaker, chronic anticoag  . MVP (mitral valve prolapse)   . Left inguinal hernia     direct  . IBS (irritable bowel syndrome)   . GERD (gastroesophageal reflux disease)   . Hyperkalemia   . Internal hemorrhoids   . Diverticulosis    Past Surgical History  Procedure Laterality Date  . Thyroidectomy    . Mastectomy  1982    Bilateral  . Tonsillectomy    . Pacemaker insertion    . Upper gastrointestinal endoscopy  09/06/2000    normal  . Colonoscopy  10/28/2005    internal hemorrhoids, diverticulosis (same as in 2002 and random bxs negative then)    Current Outpatient Prescriptions  Medication Sig Dispense Refill  . acetaminophen (TYLENOL ARTHRITIS PAIN) 650 MG CR tablet Take 1,300 mg by mouth 2 (two) times daily.      . fluticasone (FLONASE) 50 MCG/ACT nasal spray Place 2 sprays into both nostrils daily. 16 g 2  . hyoscyamine (LEVSIN SL) 0.125 MG SL tablet Place 1 tablet (0.125 mg total) under the tongue every 8 (eight) hours as needed. For cramps 90 tablet 3  . levothyroxine (SYNTHROID, LEVOTHROID)  88 MCG tablet TAKE ONE TABLET BY MOUTH ONCE DAILY 90 tablet 1  . loperamide (IMODIUM A-D) 2 MG tablet Take 2 mg by mouth as needed for diarrhea or loose stools.    . pantoprazole (PROTONIX) 40 MG tablet Take 1 tablet (40 mg total) by mouth daily. 30 tablet 6  . warfarin (COUMADIN) 3 MG tablet TAKE AS DIRECTED BY  COUMADIN  CLINIC 30 tablet 3   No current facility-administered medications for this visit.    Allergies:   Review of patient's allergies indicates no known allergies.   Social History: Social History   Social History  . Marital Status: Widowed    Spouse Name: N/A  . Number of Children: 3  . Years of Education: N/A   Occupational History  . Retired     Social History Main Topics  . Smoking status: Former Smoker    Quit date: 02/08/1954  . Smokeless tobacco: Never Used  . Alcohol Use: No  . Drug Use: No  . Sexual Activity: Not Currently   Other Topics Concern  . Not on file   Social History Narrative   Regular Exercise: Yes   Daily Caffeine Use: Sometimes.             Family History: Family History  Problem Relation Age of Onset  . Arthritis Other   . Hypertension Other   .  Stroke Mother   . Colon cancer Father 62  . Heart attack Neg Hx      Review of Systems: All other systems reviewed and are otherwise negative except as noted above.   Physical Exam: VS:  BP 140/72 mmHg  Pulse 72  Ht 5\' 6"  (1.676 m)  Wt 133 lb (60.328 kg)  BMI 21.48 kg/m2 , BMI Body mass index is 21.48 kg/(m^2).  GEN- The patient is elderly appearing, alert and oriented x 3 today.   HEENT: normocephalic, atraumatic; sclera clear, conjunctiva pink; hearing intact; oropharynx clear; neck supple  Lungs- Clear to ausculation bilaterally, normal work of breathing.  No wheezes, rales, rhonchi Heart- Regular rate and rhythm, no murmurs, rubs or gallops  GI- soft, non-tender, non-distended, bowel sounds present  Extremities- no clubbing, cyanosis, or edema; DP/PT/radial pulses 2+  bilaterally MS- no significant deformity or atrophy Skin- warm and dry, no rash or lesion; PPM pocket well healed Psych- euthymic mood, full affect Neuro- strength and sensation are intact  PPM Interrogation- reviewed in detail today,  See PACEART report  EKG:  EKG is ordered today. The ekg ordered today shows atrial fibrillation with V pacing at 65  Recent Labs: 07/25/2014: TSH 1.60 10/31/2014: BUN 17; Creatinine, Ser 1.02; Hemoglobin 13.3; Platelets 194.0; Potassium 4.8; Sodium 137   Wt Readings from Last 3 Encounters:  02/13/15 133 lb (60.328 kg)  01/13/15 132 lb 8 oz (60.102 kg)  11/18/14 132 lb 6.4 oz (60.056 kg)     Other studies Reviewed: Additional studies/ records that were reviewed today include:Dr Klein's office notes  Assessment and Plan:  1.  Complete heart block Pacemaker at [facility].  Risks, benefits to pacemaker generator change discussed with the patient today who wishes to proceed. Will schedule with Dr Caryl Comes at the next available time. The patient is device dependent and has not had recent echo.  However, with advanced age and lack of heart failure symptoms, would not repeat at this time.   2.  Permanent atrial fibrillation Continue Warfarin for CHADS2VASC of 3 Will need INR <2.5 for procedure   Current medicines are reviewed at length with the patient today.   The patient does not have concerns regarding her medicines.  The following changes were made today:  none  Labs/ tests ordered today include: pre-procedure labs   Disposition:   Follow up with Dr Caryl Comes following generator change    Signed, Chanetta Marshall, NP 02/13/2015 2:21 PM  [place] [address] [phone] (office) [phone] (fax)

## 2015-02-20 ENCOUNTER — Ambulatory Visit (INDEPENDENT_AMBULATORY_CARE_PROVIDER_SITE_OTHER): Payer: Medicare Other | Admitting: *Deleted

## 2015-02-20 ENCOUNTER — Other Ambulatory Visit (INDEPENDENT_AMBULATORY_CARE_PROVIDER_SITE_OTHER): Payer: Medicare Other

## 2015-02-20 DIAGNOSIS — I482 Chronic atrial fibrillation: Secondary | ICD-10-CM | POA: Diagnosis not present

## 2015-02-20 DIAGNOSIS — I442 Atrioventricular block, complete: Secondary | ICD-10-CM

## 2015-02-20 DIAGNOSIS — I4891 Unspecified atrial fibrillation: Secondary | ICD-10-CM

## 2015-02-20 DIAGNOSIS — Z5181 Encounter for therapeutic drug level monitoring: Secondary | ICD-10-CM | POA: Diagnosis not present

## 2015-02-20 DIAGNOSIS — I4821 Permanent atrial fibrillation: Secondary | ICD-10-CM

## 2015-02-20 LAB — CBC
HEMATOCRIT: 38.4 % (ref 36.0–46.0)
Hemoglobin: 12.3 g/dL (ref 12.0–15.0)
MCH: 29.1 pg (ref 26.0–34.0)
MCHC: 32 g/dL (ref 30.0–36.0)
MCV: 91 fL (ref 78.0–100.0)
MPV: 11.2 fL (ref 8.6–12.4)
PLATELETS: 215 10*3/uL (ref 150–400)
RBC: 4.22 MIL/uL (ref 3.87–5.11)
RDW: 13 % (ref 11.5–15.5)
WBC: 5.6 10*3/uL (ref 4.0–10.5)

## 2015-02-20 LAB — BASIC METABOLIC PANEL
BUN: 32 mg/dL — ABNORMAL HIGH (ref 7–25)
CHLORIDE: 106 mmol/L (ref 98–110)
CO2: 27 mmol/L (ref 20–31)
Calcium: 8.8 mg/dL (ref 8.6–10.4)
Creat: 1.1 mg/dL — ABNORMAL HIGH (ref 0.60–0.88)
GLUCOSE: 103 mg/dL — AB (ref 65–99)
POTASSIUM: 4.2 mmol/L (ref 3.5–5.3)
SODIUM: 141 mmol/L (ref 135–146)

## 2015-02-20 LAB — POCT INR: INR: 2.1

## 2015-02-20 LAB — PROTIME-INR
INR: 1.92 — AB (ref <1.50)
PROTHROMBIN TIME: 22.2 s — AB (ref 11.6–15.2)

## 2015-02-20 NOTE — Addendum Note (Signed)
Addended by: Eulis Foster on: 02/20/2015 09:06 AM   Modules accepted: Orders

## 2015-02-24 ENCOUNTER — Telehealth: Payer: Self-pay | Admitting: Internal Medicine

## 2015-02-24 NOTE — Telephone Encounter (Signed)
New message      Pt c/o medication issue:  1. Name of Medication: coumadin 2. How are you currently taking this medication (dosage and times per day)? 3mg   3. Are you having a reaction (difficulty breathing--STAT)? no 4. What is your medication issue? Pt is having a procedure on wed, she has had nosebleeds since last night.  Should she see someone today to stop the bleeding?  Please call

## 2015-02-24 NOTE — Telephone Encounter (Signed)
Returned call to pt, pt states she has a large clot of blood in her nare, but bleeding has stopped at present.  Nosebleed started yesterday at 10:30pm after pt picked at nose, lasted approx 5 hrs on and off.  Pt packed nose, but would remove packing and clotted blood, and bleeding would resume, did this several times over the course of the night.  Pt states her nose is not packed at present, but does have clot of blood present in nose without active bleeding currently.  Advised pt to leave clot in nose and allow to coagulate, sounds like she has a surface vessel that is bleeding needs to allow time to clot off.  Advised pt if starts bleeding again may need to go to ED or ENT for nose to be packed or vessel to be cauterized.  Pt verbalized understanding.

## 2015-02-26 ENCOUNTER — Ambulatory Visit (HOSPITAL_COMMUNITY)
Admission: RE | Admit: 2015-02-26 | Discharge: 2015-02-26 | Disposition: A | Discharge status: Home / Self Care | Payer: Medicare Other | Source: Ambulatory Visit | Attending: Internal Medicine | Admitting: Internal Medicine

## 2015-02-26 ENCOUNTER — Telehealth: Payer: Self-pay | Admitting: Internal Medicine

## 2015-02-26 ENCOUNTER — Encounter (HOSPITAL_COMMUNITY)
Admission: RE | Disposition: A | Discharge status: Home / Self Care | Payer: Self-pay | Source: Ambulatory Visit | Attending: Internal Medicine

## 2015-02-26 DIAGNOSIS — Z45010 Encounter for checking and testing of cardiac pacemaker pulse generator [battery]: Secondary | ICD-10-CM | POA: Insufficient documentation

## 2015-02-26 DIAGNOSIS — M810 Age-related osteoporosis without current pathological fracture: Secondary | ICD-10-CM | POA: Diagnosis not present

## 2015-02-26 DIAGNOSIS — Z950 Presence of cardiac pacemaker: Secondary | ICD-10-CM | POA: Diagnosis present

## 2015-02-26 DIAGNOSIS — Z7951 Long term (current) use of inhaled steroids: Secondary | ICD-10-CM | POA: Diagnosis not present

## 2015-02-26 DIAGNOSIS — Z87891 Personal history of nicotine dependence: Secondary | ICD-10-CM | POA: Insufficient documentation

## 2015-02-26 DIAGNOSIS — Z853 Personal history of malignant neoplasm of breast: Secondary | ICD-10-CM | POA: Insufficient documentation

## 2015-02-26 DIAGNOSIS — K589 Irritable bowel syndrome without diarrhea: Secondary | ICD-10-CM | POA: Diagnosis not present

## 2015-02-26 DIAGNOSIS — I442 Atrioventricular block, complete: Secondary | ICD-10-CM

## 2015-02-26 DIAGNOSIS — K219 Gastro-esophageal reflux disease without esophagitis: Secondary | ICD-10-CM | POA: Insufficient documentation

## 2015-02-26 DIAGNOSIS — I482 Chronic atrial fibrillation: Secondary | ICD-10-CM | POA: Diagnosis not present

## 2015-02-26 DIAGNOSIS — Z85850 Personal history of malignant neoplasm of thyroid: Secondary | ICD-10-CM | POA: Diagnosis not present

## 2015-02-26 DIAGNOSIS — E875 Hyperkalemia: Secondary | ICD-10-CM | POA: Diagnosis not present

## 2015-02-26 DIAGNOSIS — E039 Hypothyroidism, unspecified: Secondary | ICD-10-CM | POA: Diagnosis not present

## 2015-02-26 DIAGNOSIS — I341 Nonrheumatic mitral (valve) prolapse: Secondary | ICD-10-CM | POA: Insufficient documentation

## 2015-02-26 DIAGNOSIS — I4891 Unspecified atrial fibrillation: Secondary | ICD-10-CM | POA: Diagnosis present

## 2015-02-26 DIAGNOSIS — I4821 Permanent atrial fibrillation: Secondary | ICD-10-CM | POA: Diagnosis present

## 2015-02-26 DIAGNOSIS — N289 Disorder of kidney and ureter, unspecified: Secondary | ICD-10-CM | POA: Diagnosis not present

## 2015-02-26 DIAGNOSIS — Z7901 Long term (current) use of anticoagulants: Secondary | ICD-10-CM | POA: Insufficient documentation

## 2015-02-26 HISTORY — PX: EP IMPLANTABLE DEVICE: SHX172B

## 2015-02-26 LAB — PROTIME-INR
INR: 2.21 — AB (ref 0.00–1.49)
PROTHROMBIN TIME: 24.3 s — AB (ref 11.6–15.2)

## 2015-02-26 LAB — SURGICAL PCR SCREEN
MRSA, PCR: NEGATIVE
STAPHYLOCOCCUS AUREUS: NEGATIVE

## 2015-02-26 SURGERY — PPM/BIV PPM GENERATOR CHANGEOUT
Anesthesia: LOCAL

## 2015-02-26 MED ORDER — FENTANYL CITRATE (PF) 100 MCG/2ML IJ SOLN
INTRAMUSCULAR | Status: AC
  Filled 2015-02-26: qty 2

## 2015-02-26 MED ORDER — MIDAZOLAM HCL 5 MG/5ML IJ SOLN
INTRAMUSCULAR | Status: AC
  Filled 2015-02-26: qty 5

## 2015-02-26 MED ORDER — SODIUM CHLORIDE 0.9 % IR SOLN
Status: AC
  Filled 2015-02-26: qty 2

## 2015-02-26 MED ORDER — MUPIROCIN 2 % EX OINT
TOPICAL_OINTMENT | CUTANEOUS | Status: AC
  Filled 2015-02-26: qty 22

## 2015-02-26 MED ORDER — MIDAZOLAM HCL 5 MG/5ML IJ SOLN
INTRAMUSCULAR | Status: DC | PRN
  Administered 2015-02-26 (×2): 1 mg via INTRAVENOUS

## 2015-02-26 MED ORDER — SODIUM CHLORIDE 0.9 % IV SOLN
INTRAVENOUS | Status: AC
Start: 2015-02-26 — End: 2015-02-26

## 2015-02-26 MED ORDER — SODIUM CHLORIDE 0.9 % IV SOLN: INTRAVENOUS | Status: AC

## 2015-02-26 MED ORDER — ONDANSETRON HCL 4 MG/2ML IJ SOLN
4.0000 mg | Freq: Four times a day (QID) | INTRAMUSCULAR | Status: DC | PRN
Start: 2015-02-26 — End: 2015-02-26

## 2015-02-26 MED ORDER — ACETAMINOPHEN 325 MG PO TABS: 325.0000 mg | ORAL_TABLET | ORAL | Status: DC | PRN

## 2015-02-26 MED ORDER — SODIUM CHLORIDE 0.9 % IV SOLN
INTRAVENOUS | Status: DC
  Administered 2015-02-26: 13:00:00 via INTRAVENOUS

## 2015-02-26 MED ORDER — MUPIROCIN 2 % EX OINT
TOPICAL_OINTMENT | Freq: Once | CUTANEOUS | Status: AC
  Administered 2015-02-26: 13:00:00 via NASAL

## 2015-02-26 MED ORDER — FENTANYL CITRATE (PF) 100 MCG/2ML IJ SOLN
INTRAMUSCULAR | Status: DC | PRN
  Administered 2015-02-26: 25 ug via INTRAVENOUS

## 2015-02-26 MED ORDER — CEFAZOLIN SODIUM-DEXTROSE 2-3 GM-% IV SOLR
INTRAVENOUS | Status: AC
  Filled 2015-02-26: qty 50

## 2015-02-26 MED ORDER — ONDANSETRON HCL 4 MG/2ML IJ SOLN: 4.0000 mg | Freq: Four times a day (QID) | INTRAMUSCULAR | Status: DC | PRN

## 2015-02-26 MED ORDER — CEFAZOLIN SODIUM-DEXTROSE 2-3 GM-% IV SOLR
2.0000 g | INTRAVENOUS | Status: AC
  Administered 2015-02-26: 2 g via INTRAVENOUS

## 2015-02-26 MED ORDER — LIDOCAINE HCL (PF) 1 % IJ SOLN
INTRAMUSCULAR | Status: DC | PRN
  Administered 2015-02-26: 32 mL

## 2015-02-26 MED ORDER — GENTAMICIN SULFATE 40 MG/ML IJ SOLN
80.0000 mg | INTRAMUSCULAR | Status: AC
  Administered 2015-02-26: 80 mg

## 2015-02-26 MED ORDER — LIDOCAINE HCL (PF) 1 % IJ SOLN
INTRAMUSCULAR | Status: AC
  Filled 2015-02-26: qty 60

## 2015-02-26 MED ORDER — CHLORHEXIDINE GLUCONATE 4 % EX LIQD: 60.0000 mL | Freq: Once | CUTANEOUS | Status: DC

## 2015-02-26 SURGICAL SUPPLY — 6 items
CABLE SURGICAL S-101-97-12 (CABLE) ×1 IMPLANT
HEMOSTAT SURGICEL 2X4 FIBR (HEMOSTASIS) ×1 IMPLANT
PACEMAKER ADAPTA DR ADDR01 (Pacemaker) IMPLANT
PAD DEFIB LIFELINK (PAD) ×1 IMPLANT
PPM ADAPTA DR ADDR01 (Pacemaker) ×2 IMPLANT
TRAY PACEMAKER INSERTION (PACKS) ×1 IMPLANT

## 2015-02-26 NOTE — H&P (View-Only) (Signed)
Electrophysiology Office Note Date: 02/13/2015  ID:  KRYSTEL MEDUNA, DOB July 11, 1924, MRN [id number]  PCP: [name], MD Electrophysiologist: Caryl Comes  CC: Pacemaker at [place] is a 82 y.o. female seen today for Dr Caryl Comes.  Her pacemaker has been found to be at [facility] and she presents today to discuss pacemaker generator change.  Since last being seen in our clinic, the patient reports doing very well.  She denies chest pain, palpitations, dyspnea, PND, orthopnea, nausea, vomiting, dizziness, syncope, edema, weight gain, or early satiety. She has noticed some decrease in exercise tolerance since reaching ERI.   Device History: MDT dual chamber PPM implanted 2000 for CHB; gen change 2007   Past Medical History  Diagnosis Date  . Hypothyroidism   . Osteoporosis   . Renal insufficiency   . Thyroid cancer ([place])   . Hx of breast cancer   . Atrial fibrillation ([place])     pacemaker, chronic anticoag  . MVP (mitral valve prolapse)   . Left inguinal hernia     direct  . IBS (irritable bowel syndrome)   . GERD (gastroesophageal reflux disease)   . Hyperkalemia   . Internal hemorrhoids   . Diverticulosis    Past Surgical History  Procedure Laterality Date  . Thyroidectomy    . Mastectomy  1982    Bilateral  . Tonsillectomy    . Pacemaker insertion    . Upper gastrointestinal endoscopy  09/06/2000    normal  . Colonoscopy  10/28/2005    internal hemorrhoids, diverticulosis (same as in 2002 and random bxs negative then)    Current Outpatient Prescriptions  Medication Sig Dispense Refill  . acetaminophen (TYLENOL ARTHRITIS PAIN) 650 MG CR tablet Take 1,300 mg by mouth 2 (two) times daily.      . fluticasone (FLONASE) 50 MCG/ACT nasal spray Place 2 sprays into both nostrils daily. 16 g 2  . hyoscyamine (LEVSIN SL) 0.125 MG SL tablet Place 1 tablet (0.125 mg total) under the tongue every 8 (eight) hours as needed. For cramps 90 tablet 3  . levothyroxine (SYNTHROID, LEVOTHROID)  88 MCG tablet TAKE ONE TABLET BY MOUTH ONCE DAILY 90 tablet 1  . loperamide (IMODIUM A-D) 2 MG tablet Take 2 mg by mouth as needed for diarrhea or loose stools.    . pantoprazole (PROTONIX) 40 MG tablet Take 1 tablet (40 mg total) by mouth daily. 30 tablet 6  . warfarin (COUMADIN) 3 MG tablet TAKE AS DIRECTED BY  COUMADIN  CLINIC 30 tablet 3   No current facility-administered medications for this visit.    Allergies:   Review of patient's allergies indicates no known allergies.   Social History: Social History   Social History  . Marital Status: Widowed    Spouse Name: N/A  . Number of Children: 3  . Years of Education: N/A   Occupational History  . Retired     Social History Main Topics  . Smoking status: Former Smoker    Quit date: 02/08/1954  . Smokeless tobacco: Never Used  . Alcohol Use: No  . Drug Use: No  . Sexual Activity: Not Currently   Other Topics Concern  . Not on file   Social History Narrative   Regular Exercise: Yes   Daily Caffeine Use: Sometimes.             Family History: Family History  Problem Relation Age of Onset  . Arthritis Other   . Hypertension Other   .  Stroke Mother   . Colon cancer Father 51  . Heart attack Neg Hx      Review of Systems: All other systems reviewed and are otherwise negative except as noted above.   Physical Exam: VS:  BP 140/72 mmHg  Pulse 72  Ht 5\' 6"  (1.676 m)  Wt 133 lb (60.328 kg)  BMI 21.48 kg/m2 , BMI Body mass index is 21.48 kg/(m^2).  GEN- The patient is elderly appearing, alert and oriented x 3 today.   HEENT: normocephalic, atraumatic; sclera clear, conjunctiva pink; hearing intact; oropharynx clear; neck supple  Lungs- Clear to ausculation bilaterally, normal work of breathing.  No wheezes, rales, rhonchi Heart- Regular rate and rhythm, no murmurs, rubs or gallops  GI- soft, non-tender, non-distended, bowel sounds present  Extremities- no clubbing, cyanosis, or edema; DP/PT/radial pulses 2+  bilaterally MS- no significant deformity or atrophy Skin- warm and dry, no rash or lesion; PPM pocket well healed Psych- euthymic mood, full affect Neuro- strength and sensation are intact  PPM Interrogation- reviewed in detail today,  See PACEART report  EKG:  EKG is ordered today. The ekg ordered today shows atrial fibrillation with V pacing at 65  Recent Labs: 07/25/2014: TSH 1.60 10/31/2014: BUN 17; Creatinine, Ser 1.02; Hemoglobin 13.3; Platelets 194.0; Potassium 4.8; Sodium 137   Wt Readings from Last 3 Encounters:  02/13/15 133 lb (60.328 kg)  01/13/15 132 lb 8 oz (60.102 kg)  11/18/14 132 lb 6.4 oz (60.056 kg)     Other studies Reviewed: Additional studies/ records that were reviewed today include:Dr Klein's office notes  Assessment and Plan:  1.  Complete heart block Pacemaker at [facility]RI.  Risks, benefits to pacemaker generator change discussed with the patient today who wishes to proceed. Will schedule with Dr Caryl Comes at the next available time. The patient is device dependent and has not had recent echo.  However, with advanced age and lack of heart failure symptoms, would not repeat at this time.   2.  Permanent atrial fibrillation Continue Warfarin for CHADS2VASC of 3 Will need INR <2.5 for procedure   Current medicines are reviewed at length with the patient today.   The patient does not have concerns regarding her medicines.  The following changes were made today:  none  Labs/ tests ordered today include: pre-procedure labs   Disposition:   Follow up with Dr Caryl Comes following generator change    Signed, Chanetta Marshall, NP 02/13/2015 2:21 PM  [place] [address] [phone] (office) [phone] (fax)

## 2015-02-26 NOTE — Discharge Instructions (Signed)

## 2015-02-26 NOTE — Interval H&P Note (Signed)
History and Physical Interval Note:  02/26/2015 1:48 PM  Alyssa [name]  has presented today for surgery, with the diagnosis of eri  The various methods of treatment have been discussed with the patient and family. After consideration of risks, benefits and other options for treatment, the patient has consented to  Procedure(s): PPM Generator Changeout (N/A) as a surgical intervention .  The patient's history has been reviewed, patient examined, no change in status, stable for surgery.  I have reviewed the patient's chart and labs.  Questions were answered to the patient's satisfaction.    pt seen and exmained We have reviewed the benefits and risks of generator replacement.  These include but are not limited to lead fracture and infection.  The patient understands, agrees and is willing to proceed.     Virl Axe

## 2015-02-26 NOTE — Telephone Encounter (Signed)
Reviewed symptoms with pt, she has taken imodium x 2. She states she feels fine otherwise. Nervousness brought on IBS. Pt was told to wait till 11-11;30 to make a judgement call as to a discission  to cancel her battery change. She will call if needs to cancel. Pt to be at the hospital @12 :30 for a 14:30 table time. Cath lab // [facility] was updated.

## 2015-02-26 NOTE — Telephone Encounter (Signed)
New Message  Pt is sched for ICD replacement surgery today 1/18- but has c/o of serious diarrhea as a result of her IBS this am- pt wanted to know what she should do- concerning the surgery. Please call back and discuss.

## 2015-02-27 ENCOUNTER — Encounter (HOSPITAL_COMMUNITY): Payer: Self-pay | Admitting: Internal Medicine

## 2015-03-03 ENCOUNTER — Ambulatory Visit (INDEPENDENT_AMBULATORY_CARE_PROVIDER_SITE_OTHER): Payer: Medicare Other | Admitting: *Deleted

## 2015-03-03 ENCOUNTER — Telehealth: Payer: Self-pay | Admitting: Internal Medicine

## 2015-03-03 DIAGNOSIS — I4891 Unspecified atrial fibrillation: Secondary | ICD-10-CM | POA: Diagnosis not present

## 2015-03-03 DIAGNOSIS — Z5181 Encounter for therapeutic drug level monitoring: Secondary | ICD-10-CM | POA: Diagnosis not present

## 2015-03-03 DIAGNOSIS — R197 Diarrhea, unspecified: Secondary | ICD-10-CM

## 2015-03-03 LAB — POCT INR: INR: 4.7

## 2015-03-03 NOTE — Telephone Encounter (Signed)
Patient had a pacemaker placed last week.  She is having 6 + watery stools a day despite imodium.  She took 2 imodium today with no relief, she is also using hyoscyamine with no relief.   She had a dose of IV Gentamicin prior to the pacemaker placement.  No other antibiotics.  I scheduled her to see Arta Bruce, PA on 03/05/15 10:15.  Please advise if needs additional treatment or orders until she sees [employer], [place]

## 2015-03-03 NOTE — Telephone Encounter (Signed)
Patient notified  She will come for stool specimens tomorrow and office visit for Wed with Cecille Rubin Hvozdovic, PA

## 2015-03-03 NOTE — Telephone Encounter (Signed)
Left message for patient to call back  

## 2015-03-03 NOTE — Telephone Encounter (Signed)
patient states that she had surgery last wednesday. She states that the dr replaced her pacemaker. she says that she has been having diarrhea ever since her surgery. she is wanting to know what she can take for this. she states that imodium is not helping anymore.

## 2015-03-03 NOTE — Telephone Encounter (Signed)
C diff PCR and a fecal lactoferrin re: diarrhea

## 2015-03-04 ENCOUNTER — Other Ambulatory Visit: Payer: Medicare Other

## 2015-03-04 DIAGNOSIS — R197 Diarrhea, unspecified: Secondary | ICD-10-CM

## 2015-03-05 ENCOUNTER — Ambulatory Visit (INDEPENDENT_AMBULATORY_CARE_PROVIDER_SITE_OTHER): Payer: Medicare Other | Admitting: Physician Assistant

## 2015-03-05 ENCOUNTER — Encounter: Payer: Self-pay | Admitting: Physician Assistant

## 2015-03-05 VITALS — BP 144/80 | HR 80 | Temp 98.9°F | Ht 66.5 in | Wt 131.2 lb

## 2015-03-05 DIAGNOSIS — K589 Irritable bowel syndrome without diarrhea: Secondary | ICD-10-CM | POA: Diagnosis not present

## 2015-03-05 DIAGNOSIS — K591 Functional diarrhea: Secondary | ICD-10-CM

## 2015-03-05 LAB — FECAL LACTOFERRIN, QUANT: Lactoferrin: POSITIVE

## 2015-03-05 LAB — CLOSTRIDIUM DIFFICILE BY PCR: Toxigenic C. Difficile by PCR: NOT DETECTED

## 2015-03-05 MED ORDER — HYOSCYAMINE SULFATE 0.125 MG SL SUBL: 0.1250 mg | SUBLINGUAL_TABLET | Freq: Three times a day (TID) | SUBLINGUAL | Status: DC | PRN

## 2015-03-05 MED ORDER — VSL#3 DS PO PACK: 1.0000 | PACK | ORAL | Status: DC

## 2015-03-05 NOTE — Progress Notes (Signed)
Quick Note:  Does not have C diff Some inflammation suspected by + fecal lactoferrin but non-specific Please get a sx update ______

## 2015-03-05 NOTE — Patient Instructions (Signed)
We have sent the following medications to your pharmacy for you to pick up at your convenience:  VSL #3 one daily for one month, then one every other day for one month, then 1 twice a day for 2 months.   Levsin 0.125 mg sublingual every 8 hrs as needed for cramps.   Follow up as needed.

## 2015-03-05 NOTE — Progress Notes (Signed)
Patient ID: Alyssa [name], female   DOB: Aug 03, 1924, 82 y.o.   MRN: [id number]     History of Present Illness: Alyssa [name]  Is a delightful 82 year old female known to Dr. Carlean Purl with history of IBS. She has been treated periodically for small intestinal bacterial overgrowth. She has used Levsin as needed with good relief. She was given a trial of the SL #3 in November and feels that it really helped regulate her stools. She was last seen in December at which time she was doing fairly well and she was advised to stop her V SL. She was doing fairly well but about a week and a half ago had a pacemaker battery change with a dose of IV gentamicin. Since then she has been having watery diarrhea 3-8 times per day. She dropped off a stool sample for C. Difficile yesterday but the results are not better yet. She reports that last night she took for Imodium's and has not had a bowel movement since. She has had no fever or chills or night sweats. She has no associated nausea or vomiting. She denies bright red blood per rectum or melena.   Past Medical History  Diagnosis Date  . Hypothyroidism   . Osteoporosis   . Renal insufficiency   . Thyroid cancer ([place])   . Hx of breast cancer   . Atrial fibrillation ([place])     pacemaker, chronic anticoag  . MVP (mitral valve prolapse)   . Left inguinal hernia     direct  . IBS (irritable bowel syndrome)   . GERD (gastroesophageal reflux disease)   . Hyperkalemia   . Internal hemorrhoids   . Diverticulosis     Past Surgical History  Procedure Laterality Date  . Thyroidectomy    . Mastectomy  1982    Bilateral  . Tonsillectomy    . Pacemaker insertion    . Upper gastrointestinal endoscopy  09/06/2000    normal  . Colonoscopy  10/28/2005    internal hemorrhoids, diverticulosis (same as in 2002 and random bxs negative then)  . Ep implantable device N/A 02/26/2015    Procedure: PPM Generator Changeout;  Surgeon: [name], MD;  Location: [facility];  Service: Cardiovascular;  Laterality: N/A;   Family History  Problem Relation Age of Onset  . Arthritis Other   . Hypertension Other   . Stroke Mother   . Colon cancer Father 26  . Heart attack Neg Hx    Social History  Substance Use Topics  . Smoking status: Former Smoker    Quit date: 02/08/1954  . Smokeless tobacco: Never Used  . Alcohol Use: No   Current Outpatient Prescriptions  Medication Sig Dispense Refill  . acetaminophen (TYLENOL ARTHRITIS PAIN) 650 MG CR tablet Take 1,300 mg by mouth 2 (two) times daily.      . fluticasone (FLONASE) 50 MCG/ACT nasal spray Place 2 sprays into both nostrils daily. (Patient taking differently: Place 2 sprays into both nostrils daily as needed for allergies. ) 16 g 2  . hyoscyamine (LEVSIN SL) 0.125 MG SL tablet Place 1 tablet (0.125 mg total) under the tongue every 8 (eight) hours as needed. For cramps 90 tablet 3  . Lactobacillus Rhamnosus, GG, (CULTURELLE PO) Take 1 capsule by mouth daily.    Marland Kitchen levothyroxine (SYNTHROID, LEVOTHROID) 88 MCG tablet TAKE ONE TABLET BY MOUTH ONCE DAILY 90 tablet 1  . loperamide (IMODIUM A-D) 2 MG tablet Take 2 mg by mouth as needed  for diarrhea or loose stools.    . pantoprazole (PROTONIX) 40 MG tablet Take 1 tablet (40 mg total) by mouth daily. 30 tablet 6  . warfarin (COUMADIN) 3 MG tablet TAKE AS DIRECTED BY  COUMADIN  CLINIC (Patient taking differently: Take 1.5-3 mg by mouth daily at 6 PM. Take 1.5mg  on Tuesday & Sunday All other days take 1 tablet) 30 tablet 3  . hyoscyamine (LEVSIN SL) 0.125 MG SL tablet Place 1 tablet (0.125 mg total) under the tongue every 8 (eight) hours as needed for cramping. 30 tablet 1  . Probiotic Product (VSL#3 DS) PACK Take 1 each by mouth as directed. 60 each 3   No current facility-administered medications for this visit.   No Known Allergies   Review of Systems: Gen: Denies any fever, chills, sweats, anorexia, fatigue, weakness, malaise, weight loss, and sleep  disorder CV: Denies chest pain, angina, palpitations, syncope, orthopnea, PND, peripheral edema, and claudication. Resp: Denies dyspnea at rest, dyspnea with exercise, cough, sputum, wheezing, coughing up blood, and pleurisy. GI: Denies vomiting blood, jaundice, and fecal incontinence.   Denies dysphagia or odynophagia. GU : Denies urinary burning, blood in urine, urinary frequency, urinary hesitancy, nocturnal urination, and urinary incontinence. MS: Denies joint pain, limitation of movement, and swelling, stiffness, low back pain, extremity pain. Denies muscle weakness, cramps, atrophy.  Derm: Denies rash, itching, dry skin, hives, moles, warts, or unhealing ulcers.  Psych: Denies depression, anxiety, memory loss, suicidal ideation, hallucinations, paranoia, and confusion. Heme: Denies bruising, bleeding, and enlarged lymph nodes. Neuro:  Denies any headaches, dizziness, paresthesia Endo:  Denies any problems with DM, thyroid, adrenal    Physical Exam: BP 144/80 mmHg  Pulse 80  Temp(Src) 98.9 F (37.2 C)  Ht 5' 6.5" (1.689 m)  Wt 131 lb 4 oz (59.535 kg)  BMI 20.87 kg/m2 General: Pleasant, well developed , female in no acute distress Head: Normocephalic and atraumatic Eyes:  sclerae anicteric, conjunctiva pink  Ears: Normal auditory acuity Lungs: Clear throughout to auscultation Heart: Regular rate and rhythm Abdomen: Soft, non distended, non-tender. No masses, no hepatomegaly. Normal bowel sounds Musculoskeletal: Symmetrical with no gross deformities  Extremities: No edema  Neurological: Alert oriented x 4, grossly nonfocal Psychological:  Alert and cooperative. Normal mood and affect  Assessment and Recommendations:   82 year old female with a history of diarrhea predominant IBS presenting with complaints of recurrent diarrhea. She feels it is related to her recent pacemaker change and dose of an antibiotic. She was instructed to restart V SL 3 one capsule daily for one month,  then 1 capsule every other day for 1 month, then 1 capsule twice weekly for a month. She was instructed to stop Imodium until we have the results of her C. Difficile test. She may use Levsin 0.125 mg one sublingually every 8 hours as needed for cramping. If her C differential is positive, she will be treated with a course of oral vancomycin. If her C. Difficile is negative, she will be treated with a combination of Levsin, V SL, and Imodium. Follow-up recommendations will be made pending the findings of her stool test.       Lester Crickenberger P PA-C 03/05/2015,

## 2015-03-06 NOTE — Progress Notes (Signed)
Quick Note:  OK - I saw that Tell her she can use some loperamide also if not doing that If worsening or not getting better in 2 weeks call back Could take several weeks to be more normal   ______

## 2015-03-09 NOTE — Progress Notes (Signed)
Agree w/ Ms. Hvozdovic's note and mangement.  

## 2015-03-10 ENCOUNTER — Ambulatory Visit (INDEPENDENT_AMBULATORY_CARE_PROVIDER_SITE_OTHER): Payer: Medicare Other | Admitting: *Deleted

## 2015-03-10 ENCOUNTER — Encounter: Payer: Self-pay | Admitting: Internal Medicine

## 2015-03-10 DIAGNOSIS — Z5181 Encounter for therapeutic drug level monitoring: Secondary | ICD-10-CM | POA: Diagnosis not present

## 2015-03-10 DIAGNOSIS — I482 Chronic atrial fibrillation: Secondary | ICD-10-CM | POA: Diagnosis not present

## 2015-03-10 DIAGNOSIS — I4891 Unspecified atrial fibrillation: Secondary | ICD-10-CM

## 2015-03-10 DIAGNOSIS — I442 Atrioventricular block, complete: Secondary | ICD-10-CM

## 2015-03-10 DIAGNOSIS — I4821 Permanent atrial fibrillation: Secondary | ICD-10-CM

## 2015-03-10 DIAGNOSIS — Z950 Presence of cardiac pacemaker: Secondary | ICD-10-CM | POA: Diagnosis not present

## 2015-03-10 LAB — CUP PACEART INCLINIC DEVICE CHECK
Battery Impedance: 100 Ohm
Battery Voltage: 2.8 V
Implantable Lead Implant Date: 20000921
Implantable Lead Location: 753859
Implantable Lead Location: 753860
Lead Channel Impedance Value: 67 Ohm
Lead Channel Pacing Threshold Amplitude: 0.75 V
Lead Channel Pacing Threshold Pulse Width: 0.4 ms
Lead Channel Setting Pacing Amplitude: 2.5 V
MDC IDC LEAD IMPLANT DT: 20000921
MDC IDC MSMT BATTERY REMAINING LONGEVITY: 140 mo
MDC IDC MSMT LEADCHNL RV IMPEDANCE VALUE: 557 Ohm
MDC IDC SESS DTM: 20170130145831
MDC IDC SET LEADCHNL RV PACING PULSEWIDTH: 0.4 ms
MDC IDC SET LEADCHNL RV SENSING SENSITIVITY: 5.6 mV
MDC IDC STAT BRADY RV PERCENT PACED: 100 %

## 2015-03-10 LAB — POCT INR: INR: 3.7

## 2015-03-10 NOTE — Progress Notes (Signed)
Wound check appointment s/p generator change. Dermabond removed. Wound without redness or edema. Incision edges approximated, wound well healed. Normal device function. Thresholds, sensing, and impedances consistent with implant measurements. Device programmed at chronic outputs. Histogram distribution appropriate for patient and level of activity. Chronic AF, +warfarin. No high ventricular rates noted. Patient educated about wound care, arm mobility, lifting restrictions. ROV in 3 months with SK.

## 2015-03-13 ENCOUNTER — Telehealth: Payer: Self-pay | Admitting: Internal Medicine

## 2015-03-13 ENCOUNTER — Telehealth: Payer: Self-pay | Admitting: *Deleted

## 2015-03-13 MED ORDER — METRONIDAZOLE 250 MG PO TABS: 250.0000 mg | ORAL_TABLET | Freq: Three times a day (TID) | ORAL | Status: DC

## 2015-03-13 NOTE — Telephone Encounter (Signed)
Patient has been on VSL #3 for one week. She is also taking Imodium up to 3/day. She is still having diarrhea. She vomitied x 1 last night. She also reports her hernia on the left side is painful off and on. She is concerned that she is not getting any better.

## 2015-03-13 NOTE — Telephone Encounter (Signed)
I called to speak to her but got voicemail  I recommend she take a course of metronidazole 250 mg tid x 7 d  This can make her INR go out so she needs a coag clinic visit also - next week to check her INR  If this does not help her then I will find a way to see her next week

## 2015-03-13 NOTE — Telephone Encounter (Signed)
Patient notified of recommendations. Rx sent. She will have a PT done next week.

## 2015-03-13 NOTE — Telephone Encounter (Signed)
Spoke with pt and she states she went to Dr Carlean Purl and has been placed on Metronidazole 250mg  tid for 7 days and will start tonight . Informed that Metronidazole and coumadin interact so instructed to take Metronidazole as ordered and change  her  Coumadin dose  to take 1/2 tablet of coumadin (2.5mg ) on  Today Feb 2nd and Friday Feb 3rd and Saturday Feb 4th and on Sunday Feb 5th and appt made to be seen in coumadin on Monday Feb 6th and pt states understanding

## 2015-03-14 NOTE — Telephone Encounter (Signed)
(  again to clarify) she takes coumadin 3mg  so 1/2 of 3mg  is 1.5mg  so this amt is what she was instructed to take on Feb 2nd ,3rd,4th,and 5th

## 2015-03-14 NOTE — Telephone Encounter (Signed)
(  To clarify previous directions given to pt )Pt takes coumadin 3mg  so she was instructed to take 1/2 tablet of the 3mg  which in 1.25mg  on Feb 2nd,3rd,4th and 5th not 2.5mg  as in previous note

## 2015-03-17 ENCOUNTER — Ambulatory Visit (INDEPENDENT_AMBULATORY_CARE_PROVIDER_SITE_OTHER): Payer: Medicare Other | Admitting: *Deleted

## 2015-03-17 DIAGNOSIS — Z5181 Encounter for therapeutic drug level monitoring: Secondary | ICD-10-CM

## 2015-03-17 DIAGNOSIS — I4891 Unspecified atrial fibrillation: Secondary | ICD-10-CM | POA: Diagnosis not present

## 2015-03-17 LAB — POCT INR: INR: 2.4

## 2015-03-19 ENCOUNTER — Encounter: Payer: Self-pay | Admitting: Internal Medicine

## 2015-03-19 ENCOUNTER — Ambulatory Visit (INDEPENDENT_AMBULATORY_CARE_PROVIDER_SITE_OTHER): Payer: Medicare Other | Admitting: Internal Medicine

## 2015-03-19 ENCOUNTER — Telehealth: Payer: Self-pay | Admitting: Internal Medicine

## 2015-03-19 VITALS — BP 124/68 | HR 72 | Ht 66.5 in | Wt 130.0 lb

## 2015-03-19 DIAGNOSIS — K589 Irritable bowel syndrome without diarrhea: Secondary | ICD-10-CM | POA: Diagnosis not present

## 2015-03-19 DIAGNOSIS — R197 Diarrhea, unspecified: Secondary | ICD-10-CM | POA: Diagnosis not present

## 2015-03-19 MED ORDER — METRONIDAZOLE 250 MG PO TABS: 250.0000 mg | ORAL_TABLET | Freq: Three times a day (TID) | ORAL | Status: DC

## 2015-03-19 NOTE — Patient Instructions (Addendum)
  We have sent the following medications to your pharmacy for you to pick up at your convenience: Flagyl, call us when your finished with a symptom update.   Today we are giving you handouts to read on Gas Prevention and Low Fiber Diet.    I appreciate the opportunity to care for you. Silvano Rusk, MD, [facility]

## 2015-03-19 NOTE — Telephone Encounter (Signed)
Left message for patient to call back to be seen this afternoon

## 2015-03-19 NOTE — Progress Notes (Signed)
   Subjective:    Patient ID: Alyssa [name], female    DOB: 1924-11-04, 82 y.o.   MRN: [id number] Cc: diarrhea HPI Delightful lady with hx IBS-D. Has had an exacerbation that began late last year around time of pacemaker change. She is on last day of 7 d metronidazole now with some improvement. Has been eating mostly bland food. No pain.  Wt Readings from Last 3 Encounters:  03/19/15 130 lb (58.968 kg)  03/05/15 131 lb 4 oz (59.535 kg)  02/26/15 131 lb (59.421 kg)   Feels like stools a bit more formed. Has used loperamide some with benefit.  Medications, allergies, past medical history, past surgical history, family history and social history are reviewed and updated in the EMR.   Review of Systems Went to A/C clinic for INR check - ok    Objective:   Physical Exam BP 124/68 mmHg  Pulse 72  Ht 5' 6.5" (1.689 m)  Wt 130 lb (58.968 kg)  BMI 20.67 kg/m2 abd is soft and NT  BS +++ Alert and oriented x 3    Assessment & Plan:  Diarrhea, unspecified type  IBS (irritable bowel syndrome)  Maybe a little better. Will Treat additional 7 d metronidazole. Use loperamide regularly and prn If this fails to work could need a flex sig vs colonoscopy.

## 2015-03-19 NOTE — Telephone Encounter (Signed)
Patient will come in today at 2:00

## 2015-03-20 ENCOUNTER — Telehealth: Payer: Self-pay | Admitting: Pharmacist

## 2015-03-20 NOTE — Telephone Encounter (Signed)
Pt called to let us know that her Flagyl course has been extended another week. She will continue Flagyl 250mg  TID through next Wednesday, 2/15. Instructed pt to take 1/2 tab Coumadin daily while she is on Flagyl. Moved up appt to recheck her INR on Monday 2/13.

## 2015-03-24 ENCOUNTER — Ambulatory Visit (INDEPENDENT_AMBULATORY_CARE_PROVIDER_SITE_OTHER): Payer: Medicare Other | Admitting: *Deleted

## 2015-03-24 DIAGNOSIS — Z5181 Encounter for therapeutic drug level monitoring: Secondary | ICD-10-CM

## 2015-03-24 DIAGNOSIS — I4891 Unspecified atrial fibrillation: Secondary | ICD-10-CM | POA: Diagnosis not present

## 2015-03-24 LAB — POCT INR: INR: 3.5

## 2015-03-27 ENCOUNTER — Telehealth: Payer: Self-pay | Admitting: Internal Medicine

## 2015-03-27 NOTE — Telephone Encounter (Signed)
  Alyssa [name] finished the flagyl today and finishes the VSL-3 tomorrow.  She feels like she is getting better with soft stools, averaging  2 stools a day now.  She is currently taking 2 Imodium every AM.  She wants to know if Dr Carlean Purl wants her to continue that or perhaps increase it.

## 2015-03-27 NOTE — Telephone Encounter (Signed)
She can increase the Imodium if she thinks she needs too  So if she does not think her stools are controlled well enough she can add 1-2 more later in day - usually wait 4-6 hours after the first dose  Hope this makes sense

## 2015-03-27 NOTE — Telephone Encounter (Signed)
Left message to call me back.

## 2015-03-27 NOTE — Telephone Encounter (Signed)
Informed patient of imodium instructions and she verbalized understanding.

## 2015-04-03 ENCOUNTER — Ambulatory Visit (INDEPENDENT_AMBULATORY_CARE_PROVIDER_SITE_OTHER): Payer: Medicare Other | Admitting: Pharmacist

## 2015-04-03 DIAGNOSIS — I4891 Unspecified atrial fibrillation: Secondary | ICD-10-CM | POA: Diagnosis not present

## 2015-04-03 DIAGNOSIS — Z5181 Encounter for therapeutic drug level monitoring: Secondary | ICD-10-CM | POA: Diagnosis not present

## 2015-04-03 LAB — POCT INR: INR: 3.4

## 2015-04-07 ENCOUNTER — Other Ambulatory Visit: Payer: Self-pay | Admitting: Internal Medicine

## 2015-04-14 ENCOUNTER — Telehealth: Payer: Self-pay | Admitting: Internal Medicine

## 2015-04-14 NOTE — Telephone Encounter (Signed)
Informed Howie Ill, RN that remote was received. No episodes recorded. Chronic VP- implanted for CHB.   Will forward the call back to her for further evaluation of edema.

## 2015-04-14 NOTE — Telephone Encounter (Signed)
Please call,ankles have been swelling for the past 2 weeks. Pt is very concerned,please call to advise asap.Marland Kitchen

## 2015-04-14 NOTE — Telephone Encounter (Signed)
Called patient. She has been having swelling in her ankles for two weeks. Patient states that her swelling goes down during the night, but comes back when she has been up walking around. Patient states it comes and goes. Asked patient to send a remote reading of her device. Patient stated she would call back if she has trouble doing the remote.

## 2015-04-14 NOTE — Telephone Encounter (Signed)
Patient calling to see if we received transmission. Informed patient as of right now, no, but will call her when we do.

## 2015-04-14 NOTE — Telephone Encounter (Signed)
Called patient and informed her of the device report. Patient verbalized understanding and will call her PCP about her swollen ankles.

## 2015-04-17 ENCOUNTER — Ambulatory Visit (INDEPENDENT_AMBULATORY_CARE_PROVIDER_SITE_OTHER): Payer: Medicare Other | Admitting: Pharmacist

## 2015-04-17 DIAGNOSIS — I4891 Unspecified atrial fibrillation: Secondary | ICD-10-CM | POA: Diagnosis not present

## 2015-04-17 DIAGNOSIS — Z5181 Encounter for therapeutic drug level monitoring: Secondary | ICD-10-CM | POA: Diagnosis not present

## 2015-04-17 LAB — POCT INR: INR: 3.1

## 2015-04-22 ENCOUNTER — Encounter: Payer: Self-pay | Admitting: Internal Medicine

## 2015-04-22 ENCOUNTER — Other Ambulatory Visit (INDEPENDENT_AMBULATORY_CARE_PROVIDER_SITE_OTHER): Payer: Medicare Other

## 2015-04-22 ENCOUNTER — Ambulatory Visit (INDEPENDENT_AMBULATORY_CARE_PROVIDER_SITE_OTHER): Payer: Medicare Other | Admitting: Internal Medicine

## 2015-04-22 VITALS — BP 148/88 | HR 75 | Temp 97.8°F | Resp 16 | Ht 66.5 in | Wt 124.0 lb

## 2015-04-22 DIAGNOSIS — N183 Chronic kidney disease, stage 3 unspecified: Secondary | ICD-10-CM

## 2015-04-22 DIAGNOSIS — M79662 Pain in left lower leg: Secondary | ICD-10-CM | POA: Diagnosis not present

## 2015-04-22 DIAGNOSIS — R011 Cardiac murmur, unspecified: Secondary | ICD-10-CM

## 2015-04-22 DIAGNOSIS — R600 Localized edema: Secondary | ICD-10-CM

## 2015-04-22 DIAGNOSIS — N189 Chronic kidney disease, unspecified: Secondary | ICD-10-CM

## 2015-04-22 DIAGNOSIS — E038 Other specified hypothyroidism: Secondary | ICD-10-CM | POA: Diagnosis not present

## 2015-04-22 DIAGNOSIS — M79669 Pain in unspecified lower leg: Secondary | ICD-10-CM | POA: Insufficient documentation

## 2015-04-22 DIAGNOSIS — R0609 Other forms of dyspnea: Secondary | ICD-10-CM | POA: Diagnosis not present

## 2015-04-22 DIAGNOSIS — I071 Rheumatic tricuspid insufficiency: Secondary | ICD-10-CM | POA: Insufficient documentation

## 2015-04-22 DIAGNOSIS — R0600 Dyspnea, unspecified: Secondary | ICD-10-CM | POA: Insufficient documentation

## 2015-04-22 LAB — CBC WITH DIFFERENTIAL/PLATELET
BASOS PCT: 0.7 % (ref 0.0–3.0)
Basophils Absolute: 0 10*3/uL (ref 0.0–0.1)
EOS ABS: 0.1 10*3/uL (ref 0.0–0.7)
Eosinophils Relative: 2.8 % (ref 0.0–5.0)
HEMATOCRIT: 38.2 % (ref 36.0–46.0)
HEMOGLOBIN: 12.5 g/dL (ref 12.0–15.0)
LYMPHS PCT: 29.4 % (ref 12.0–46.0)
Lymphs Abs: 1.4 10*3/uL (ref 0.7–4.0)
MCHC: 32.7 g/dL (ref 30.0–36.0)
MCV: 90.3 fl (ref 78.0–100.0)
MONOS PCT: 10.6 % (ref 3.0–12.0)
Monocytes Absolute: 0.5 10*3/uL (ref 0.1–1.0)
NEUTROS ABS: 2.7 10*3/uL (ref 1.4–7.7)
Neutrophils Relative %: 56.5 % (ref 43.0–77.0)
PLATELETS: 201 10*3/uL (ref 150.0–400.0)
RBC: 4.23 Mil/uL (ref 3.87–5.11)
RDW: 14.3 % (ref 11.5–15.5)
WBC: 4.8 10*3/uL (ref 4.0–10.5)

## 2015-04-22 LAB — HEPATIC FUNCTION PANEL
ALBUMIN: 4.4 g/dL (ref 3.5–5.2)
ALT: 14 U/L (ref 0–35)
AST: 22 U/L (ref 0–37)
Alkaline Phosphatase: 62 U/L (ref 39–117)
Bilirubin, Direct: 0.1 mg/dL (ref 0.0–0.3)
TOTAL PROTEIN: 6.8 g/dL (ref 6.0–8.3)
Total Bilirubin: 0.5 mg/dL (ref 0.2–1.2)

## 2015-04-22 LAB — BASIC METABOLIC PANEL
BUN: 23 mg/dL (ref 6–23)
CHLORIDE: 105 meq/L (ref 96–112)
CO2: 27 meq/L (ref 19–32)
Calcium: 8.9 mg/dL (ref 8.4–10.5)
Creatinine, Ser: 0.98 mg/dL (ref 0.40–1.20)
GFR: 56.8 mL/min — ABNORMAL LOW (ref >60.00)
Glucose, Bld: 92 mg/dL (ref 70–99)
POTASSIUM: 4.3 meq/L (ref 3.5–5.1)
SODIUM: 142 meq/L (ref 135–145)

## 2015-04-22 LAB — URINALYSIS, ROUTINE W REFLEX MICROSCOPIC
BILIRUBIN URINE: NEGATIVE
Ketones, ur: NEGATIVE
NITRITE: NEGATIVE
PH: 5.5 (ref 5.0–8.0)
Specific Gravity, Urine: 1.01 (ref 1.000–1.030)
TOTAL PROTEIN, URINE-UPE24: NEGATIVE
URINE GLUCOSE: NEGATIVE
UROBILINOGEN UA: 0.2 (ref 0.0–1.0)

## 2015-04-22 LAB — TROPONIN I: TNIDX: 0.01 ug/l (ref 0.00–0.06)

## 2015-04-22 LAB — TSH: TSH: 1.44 u[IU]/mL (ref 0.35–4.50)

## 2015-04-22 LAB — CARDIAC PANEL
CK-MB: 3.7 ng/mL (ref 0.3–4.0)
RELATIVE INDEX: 3.2 calc — AB (ref 0.0–2.5)
Total CK: 115 U/L (ref 7–177)

## 2015-04-22 LAB — BRAIN NATRIURETIC PEPTIDE: Pro B Natriuretic peptide (BNP): 366 pg/mL — ABNORMAL HIGH (ref 0.0–100.0)

## 2015-04-22 NOTE — Progress Notes (Signed)
Pre visit review using our clinic review tool, if applicable. No additional management support is needed unless otherwise documented below in the visit note. 

## 2015-04-22 NOTE — Patient Instructions (Signed)
Edema °Edema is an abnormal buildup of fluids in your body tissues. Edema is somewhat dependent on gravity to pull the fluid to the lowest place in your body. That makes the condition more common in the legs and thighs (lower extremities). Painless swelling of the feet and ankles is common and becomes more likely as you get older. It is also common in looser tissues, like around your eyes.  °When the affected area is squeezed, the fluid may move out of that spot and leave a dent for a few moments. This dent is called pitting.  °CAUSES  °There are many possible causes of edema. Eating too much salt and being on your feet or sitting for a long time can cause edema in your legs and ankles. Hot weather may make edema worse. Common medical causes of edema include: °· Heart failure. °· Liver disease. °· Kidney disease. °· Weak blood vessels in your legs. °· Cancer. °· An injury. °· Pregnancy. °· Some medications. °· Obesity.  °SYMPTOMS  °Edema is usually painless. Your skin may look swollen or shiny.  °DIAGNOSIS  °Your health care provider may be able to diagnose edema by asking about your medical history and doing a physical exam. You may need to have tests such as X-rays, an electrocardiogram, or blood tests to check for medical conditions that may cause edema.  °TREATMENT  °Edema treatment depends on the cause. If you have heart, liver, or kidney disease, you need the treatment appropriate for these conditions. General treatment may include: °· Elevation of the affected body part above the level of your heart. °· Compression of the affected body part. Pressure from elastic bandages or support stockings squeezes the tissues and forces fluid back into the blood vessels. This keeps fluid from entering the tissues. °· Restriction of fluid and salt intake. °· Use of a water pill (diuretic). These medications are appropriate only for some types of edema. They pull fluid out of your body and make you urinate more often. This  gets rid of fluid and reduces swelling, but diuretics can have side effects. Only use diuretics as directed by your health care provider. °HOME CARE INSTRUCTIONS  °· Keep the affected body part above the level of your heart when you are lying down.   °· Do not sit still or stand for prolonged periods.   °· Do not put anything directly under your knees when lying down. °· Do not wear constricting clothing or garters on your upper legs.   °· Exercise your legs to work the fluid back into your blood vessels. This may help the swelling go down.   °· Wear elastic bandages or support stockings to reduce ankle swelling as directed by your health care provider.   °· Eat a low-salt diet to reduce fluid if your health care provider recommends it.   °· Only take medicines as directed by your health care provider.  °SEEK MEDICAL CARE IF:  °· Your edema is not responding to treatment. °· You have heart, liver, or kidney disease and notice symptoms of edema. °· You have edema in your legs that does not improve after elevating them.   °· You have sudden and unexplained weight gain. °SEEK IMMEDIATE MEDICAL CARE IF:  °· You develop shortness of breath or chest pain.   °· You cannot breathe when you lie down. °· You develop pain, redness, or warmth in the swollen areas.   °· You have heart, liver, or kidney disease and suddenly get edema. °· You have a fever and your symptoms suddenly get worse. °MAKE SURE YOU:  °·   Understand these instructions. °· Will watch your condition. °· Will get help right away if you are not doing well or get worse. °  °This information is not intended to replace advice given to you by your health care provider. Make sure you discuss any questions you have with your health care provider. °  °Document Released: 01/25/2005 Document Revised: 02/15/2014 Document Reviewed: 11/17/2012 °Elsevier Interactive Patient Education ©2016 [employer] ° °

## 2015-04-22 NOTE — Progress Notes (Signed)
Subjective:  Patient ID: Alyssa [name], female    DOB: [birth year]  Age: 82 y.o. MRN: [id number]  CC: Leg Swelling and Hypothyroidism   HPI JASNOOR KIN presents for f/up - She complains of about a 4-5 week history of lower extremity edema, more on the left than the right, with mild shortness of breath and dyspnea on exertion. She has not experienced any weight gain. She denies palpitations, dizziness, chest pain, diaphoresis, or syncope. She had a pacemaker updated about 2 months ago. She also complains of a vague discomfort in her left calf but she denies claudication.  Outpatient Prescriptions Prior to Visit  Medication Sig Dispense Refill  . acetaminophen (TYLENOL ARTHRITIS PAIN) 650 MG CR tablet Take 1,300 mg by mouth 2 (two) times daily.      . fluticasone (FLONASE) 50 MCG/ACT nasal spray Place 2 sprays into both nostrils daily. (Patient taking differently: Place 2 sprays into both nostrils daily as needed for allergies. ) 16 g 2  . levothyroxine (SYNTHROID, LEVOTHROID) 88 MCG tablet TAKE ONE TABLET BY MOUTH ONCE DAILY 90 tablet 1  . loperamide (IMODIUM A-D) 2 MG tablet Take 2 mg by mouth as needed for diarrhea or loose stools.    . pantoprazole (PROTONIX) 40 MG tablet TAKE ONE TABLET BY MOUTH ONCE DAILY 30 tablet 0  . warfarin (COUMADIN) 3 MG tablet TAKE AS DIRECTED BY  COUMADIN  CLINIC (Patient taking differently: Take 1.5-3 mg by mouth daily at 6 PM. Take 1.5mg  on Tuesday & Sunday All other days take 1 tablet) 30 tablet 3  . hyoscyamine (LEVSIN SL) 0.125 MG SL tablet Place 1 tablet (0.125 mg total) under the tongue every 8 (eight) hours as needed for cramping. (Patient not taking: Reported on 04/22/2015) 30 tablet 1  . Probiotic Product (VSL#3 DS) PACK Take 1 each by mouth as directed. (Patient not taking: Reported on 04/22/2015) 60 each 3  . metroNIDAZOLE (FLAGYL) 250 MG tablet Take 1 tablet (250 mg total) by mouth 3 (three) times daily. 21 tablet 0   No facility-administered  medications prior to visit.    ROS Review of Systems  Constitutional: Negative.  Negative for chills, diaphoresis, appetite change, fatigue and unexpected weight change.  HENT: Negative.   Eyes: Negative.   Respiratory: Positive for shortness of breath. Negative for cough, choking, chest tightness, wheezing and stridor.   Cardiovascular: Positive for leg swelling. Negative for chest pain and palpitations.  Gastrointestinal: Negative.  Negative for nausea, vomiting, abdominal pain, diarrhea, constipation and blood in stool.  Endocrine: Negative.   Genitourinary: Negative.  Negative for dysuria, urgency, frequency, hematuria, decreased urine volume and difficulty urinating.  Musculoskeletal: Negative.  Negative for myalgias, back pain, joint swelling and arthralgias.  Skin: Negative.  Negative for color change and rash.  Allergic/Immunologic: Negative.   Neurological: Negative.  Negative for dizziness, syncope, weakness and light-headedness.  Hematological: Negative.  Negative for adenopathy. Does not bruise/bleed easily.  Psychiatric/Behavioral: Negative.     Objective:  BP 148/88 mmHg  Pulse 75  Temp(Src) 97.8 F (36.6 C) (Oral)  Resp 16  Ht 5' 6.5" (1.689 m)  Wt 124 lb (56.246 kg)  BMI 19.72 kg/m2  SpO2 97%  BP Readings from Last 3 Encounters:  04/22/15 148/88  03/19/15 124/68  03/05/15 144/80    Wt Readings from Last 3 Encounters:  04/22/15 124 lb (56.246 kg)  03/19/15 130 lb (58.968 kg)  03/05/15 131 lb 4 oz (59.535 kg)    Physical Exam  Constitutional: She  is oriented to person, place, and time. She appears well-developed and well-nourished. No distress.  HENT:  Mouth/Throat: Oropharynx is clear and moist. No oropharyngeal exudate.  Eyes: Conjunctivae are normal. Right eye exhibits no discharge. Left eye exhibits no discharge. No scleral icterus.  Neck: Normal range of motion. Neck supple. No JVD present. No tracheal deviation present. No thyromegaly present.    Cardiovascular: Normal rate, regular rhythm, S1 normal, S2 normal and intact distal pulses.  Exam reveals gallop and S3. Exam reveals no friction rub.   Murmur heard.  Systolic murmur is present with a grade of 1/6   No diastolic murmur is present  Pulses:      Carotid pulses are 1+ on the right side, and 1+ on the left side.      Radial pulses are 1+ on the right side, and 1+ on the left side.       Femoral pulses are 1+ on the right side, and 1+ on the left side.      Popliteal pulses are 1+ on the right side, and 1+ on the left side.       Dorsalis pedis pulses are 1+ on the right side, and 1+ on the left side.       Posterior tibial pulses are 1+ on the right side, and 1+ on the left side.  1/6 SEM over RUSB  EKG  Paced  Rhythm  -Right bundle branch block with left axis -bifascicular block.   -Old anterior infarct.   ABNORMAL - no significant change compared to an EKG done 2 months ago  Pulmonary/Chest: Effort normal and breath sounds normal. No stridor. No respiratory distress. She has no wheezes. She has no rales. She exhibits no tenderness.  Abdominal: Soft. Bowel sounds are normal. She exhibits no distension and no mass. There is no tenderness. There is no rebound and no guarding.  Musculoskeletal: Normal range of motion. She exhibits edema. She exhibits no tenderness.  There is 1+ pitting edema in the left lower extremity and trace pitting edema in the right lower extremity.  Lymphadenopathy:    She has no cervical adenopathy.  Neurological: She is oriented to person, place, and time.  Skin: Skin is warm and dry. No rash noted. She is not diaphoretic. No erythema. No pallor.  Vitals reviewed.   Lab Results  Component Value Date   WBC 4.8 04/22/2015   HGB 12.5 04/22/2015   HCT 38.2 04/22/2015   PLT 201.0 04/22/2015   GLUCOSE 92 04/22/2015   CHOL 155 02/11/2014   TRIG 84.0 02/11/2014   HDL 46.10 02/11/2014   LDLCALC 92 02/11/2014   ALT 14 04/22/2015   AST 22  04/22/2015   NA 142 04/22/2015   K 4.3 04/22/2015   CL 105 04/22/2015   CREATININE 0.98 04/22/2015   BUN 23 04/22/2015   CO2 27 04/22/2015   TSH 1.44 04/22/2015   INR 3.1 04/17/2015   HGBA1C 5.9 06/09/2012    No results found.  Assessment & Plan:   Leonda was seen today for leg swelling and hypothyroidism.  Diagnoses and all orders for this visit:  Other specified hypothyroidism- her TSH is in the normal range, she will remain on the current dose -     TSH; Future  Chronic renal insufficiency, stage III (moderate)- renal function is stable -     Urinalysis, Routine w reflex microscopic (not at [facility]); Future -     Basic metabolic panel; Future  Localized edema- her EKG is unchanged  but her BNP is elevated, the last echocardiogram was 6 years ago, she apparently has a new murmur and an S3, I will order an echocardiogram to check her ejection fraction and to identify the cause for her murmur. -     Troponin I; Future -     Brain natriuretic peptide; Future -     Hepatic function panel; Future -     D-dimer, quantitative (not at [facility]); Future -     EKG 12-Lead -     ECHOCARDIOGRAM COMPLETE; Future  DOE (dyspnea on exertion)- her labs do not show any secondary causes for dyspnea on exertion, see workup as listed above under the edema section -     Cardiac panel; Future -     Troponin I; Future -     Brain natriuretic peptide; Future -     EKG 12-Lead -     ECHOCARDIOGRAM COMPLETE; Future -     CBC with Differential/Platelet; Future  Calf pain, left- d-dimer is normal so I'm not suspicious for DVT -     D-dimer, quantitative (not at [facility]); Future  Systolic murmur- I'm concerned she has developed aortic stenosis, I have ordered an echocardiogram to see how severe this is. -     ECHOCARDIOGRAM COMPLETE; Future   I have discontinued Ms. Pare's metroNIDAZOLE. I am also having her maintain her acetaminophen, fluticasone, loperamide, levothyroxine, warfarin, VSL#3 DS,  hyoscyamine, and pantoprazole.  No orders of the defined types were placed in this encounter.     Follow-up: Return in about 3 weeks (around 05/13/2015).  [name], MD

## 2015-04-23 ENCOUNTER — Encounter: Payer: Self-pay | Admitting: Internal Medicine

## 2015-04-23 LAB — D-DIMER, QUANTITATIVE (NOT AT ARMC): D DIMER QUANT: 0.38 ug{FEU}/mL (ref 0.00–0.48)

## 2015-04-24 ENCOUNTER — Encounter: Payer: Self-pay | Admitting: Internal Medicine

## 2015-05-01 ENCOUNTER — Encounter: Payer: Self-pay | Admitting: Internal Medicine

## 2015-05-01 ENCOUNTER — Ambulatory Visit (HOSPITAL_COMMUNITY)
Admission: RE | Admit: 2015-05-01 | Discharge: 2015-05-01 | Disposition: A | Discharge status: Home / Self Care | Payer: Medicare Other | Source: Ambulatory Visit | Attending: Internal Medicine | Admitting: Internal Medicine

## 2015-05-01 ENCOUNTER — Other Ambulatory Visit: Payer: Self-pay | Admitting: Internal Medicine

## 2015-05-01 DIAGNOSIS — I313 Pericardial effusion (noninflammatory): Secondary | ICD-10-CM | POA: Insufficient documentation

## 2015-05-01 DIAGNOSIS — I071 Rheumatic tricuspid insufficiency: Secondary | ICD-10-CM | POA: Diagnosis not present

## 2015-05-01 DIAGNOSIS — R0609 Other forms of dyspnea: Secondary | ICD-10-CM

## 2015-05-01 DIAGNOSIS — R011 Cardiac murmur, unspecified: Secondary | ICD-10-CM | POA: Insufficient documentation

## 2015-05-01 DIAGNOSIS — I059 Rheumatic mitral valve disease, unspecified: Secondary | ICD-10-CM | POA: Diagnosis not present

## 2015-05-01 DIAGNOSIS — R0600 Dyspnea, unspecified: Secondary | ICD-10-CM

## 2015-05-01 DIAGNOSIS — R600 Localized edema: Secondary | ICD-10-CM | POA: Insufficient documentation

## 2015-05-01 DIAGNOSIS — I517 Cardiomegaly: Secondary | ICD-10-CM | POA: Insufficient documentation

## 2015-05-01 NOTE — Progress Notes (Signed)
  Echocardiogram 2D Echocardiogram has been performed.  Diamond Nickel 05/01/2015, 2:53 PM

## 2015-05-05 ENCOUNTER — Other Ambulatory Visit: Payer: Self-pay | Admitting: Internal Medicine

## 2015-05-05 ENCOUNTER — Telehealth: Payer: Self-pay | Admitting: Internal Medicine

## 2015-05-05 NOTE — Telephone Encounter (Signed)
Patient is requesting call back in regards to echo cardiogram.  Patient states she has not heard anything back about results or from a cardiologist.

## 2015-05-06 ENCOUNTER — Ambulatory Visit (INDEPENDENT_AMBULATORY_CARE_PROVIDER_SITE_OTHER): Payer: Medicare Other | Admitting: *Deleted

## 2015-05-06 DIAGNOSIS — Z5181 Encounter for therapeutic drug level monitoring: Secondary | ICD-10-CM | POA: Diagnosis not present

## 2015-05-06 DIAGNOSIS — I4891 Unspecified atrial fibrillation: Secondary | ICD-10-CM

## 2015-05-06 LAB — POCT INR: INR: 1.7

## 2015-05-06 NOTE — Telephone Encounter (Signed)
Spoke with [facility] (referral coordinator) appt that was scheduled with Cardio was a f/u from previous care they will contact pt today to make apt. Results were sent via mychart. PCP advisment was cardioloy referral. Pt has read [employer]

## 2015-05-06 NOTE — Telephone Encounter (Signed)
Pt called in again, gab can you call her today ?

## 2015-05-08 ENCOUNTER — Encounter: Payer: Self-pay | Admitting: Cardiology

## 2015-05-08 ENCOUNTER — Other Ambulatory Visit (HOSPITAL_COMMUNITY): Payer: Medicare Other

## 2015-05-08 ENCOUNTER — Ambulatory Visit (INDEPENDENT_AMBULATORY_CARE_PROVIDER_SITE_OTHER): Payer: Medicare Other | Admitting: Cardiology

## 2015-05-08 VITALS — BP 140/78 | HR 82 | Ht 66.5 in | Wt 123.6 lb

## 2015-05-08 DIAGNOSIS — I482 Chronic atrial fibrillation: Secondary | ICD-10-CM | POA: Diagnosis not present

## 2015-05-08 DIAGNOSIS — I272 Other secondary pulmonary hypertension: Secondary | ICD-10-CM | POA: Diagnosis not present

## 2015-05-08 DIAGNOSIS — Z950 Presence of cardiac pacemaker: Secondary | ICD-10-CM | POA: Diagnosis not present

## 2015-05-08 DIAGNOSIS — R0609 Other forms of dyspnea: Secondary | ICD-10-CM

## 2015-05-08 DIAGNOSIS — R011 Cardiac murmur, unspecified: Secondary | ICD-10-CM

## 2015-05-08 DIAGNOSIS — I4821 Permanent atrial fibrillation: Secondary | ICD-10-CM

## 2015-05-08 DIAGNOSIS — R0600 Dyspnea, unspecified: Secondary | ICD-10-CM

## 2015-05-08 MED ORDER — POTASSIUM CHLORIDE CRYS ER 20 MEQ PO TBCR: 20.0000 meq | EXTENDED_RELEASE_TABLET | ORAL | Status: DC

## 2015-05-08 MED ORDER — FUROSEMIDE 40 MG PO TABS: 40.0000 mg | ORAL_TABLET | ORAL | Status: DC

## 2015-05-08 NOTE — Patient Instructions (Addendum)
Medication Instructions:  Your physician has recommended you make the following change in your medication:  1. Start Lasix ( 40 mg ) for three days starting, Friday, March 31 and last dose on Sunday, April 2 2. Start Potassium ( 20 meq) for three days starting, Friday, March 31 and last dose on Sunday, April 2    Labwork: -None  Testing/Procedures: -None-  Follow-Up: Your physician recommends that you keep your scheduled  follow-up appointment with Dr. Caryl Comes   Any Other Special Instructions Will Be Listed Below (If Applicable).   Call our office at [phone] on Monday, April 3 and let triage nurse know how you are feeling.   If you need a refill on your cardiac medications before your next appointment, please call your pharmacy.

## 2015-05-08 NOTE — Progress Notes (Signed)
Cardiology Office Note   Date:  05/08/2015   ID:  Alyssa, Alyssa [name] Apr 30, 1924, MRN [id number]  PCP:  Alyssa Calico, MD  Cardiologist:  Dr.Klein Chief Complaint  Alyssa [name] presents with  . Shortness of Breath    lower ext edema as well improved with support stockings.       History of Present Illness: Alyssa Alyssa [name] is a 82 y.o. female who presents for lower extremity edema. And DOE.  Alyssa Alyssa [name] was seen by Dr. [name] her PCP 04/22/15 for Alyssa SOB or DOE.  He did labs BNP 311 and Alyssa Alyssa [name] was instructed to wear support stockings.  Other labs stable including kidney function.    Today Alyssa Alyssa [name] feels much better.  No chest pain. Alyssa Alyssa [name] had her PPM interrogated ( new gen. In Jan) and this was stable.    Echo was ordered Study Conclusions  - Left ventricle: Alyssa cavity size was normal. Wall thickness was  normal. Systolic function was normal. Alyssa estimated ejection  fraction was in Alyssa range of 55% to 60%. Wall motion was normal;  there were no regional wall motion abnormalities. Alyssa study is  not technically sufficient to allow evaluation of LV diastolic  function. - Mitral valve: Calcified annulus. - Left atrium: Alyssa atrium was severely dilated. - Right ventricle: Alyssa cavity size was mildly dilated. - Right atrium: Alyssa atrium was severely dilated. - Tricuspid valve: There was severe regurgitation. - Pulmonary arteries: Systolic pressure was moderately increased.  PA peak pressure: 52 mm Hg (S). - Pericardium, extracardiac: A small pericardial effusion was  identified.  Impressions:  - Normal LV function; severe biatrial enlargement; mild RVE; severe  TR with moderately elevated pulmonary pressure; small pericardial  effusion.  This is different from 2009 due to Alyssa now severe TR from mod TR.  And elevated PA pressure- was 35 mmHg in 2009 and now 52, Alyssa Alyssa [name] also has a small amount of pericardial effusion.    Alyssa Alyssa [name] has been having problems with IBS with Wt. Loss.  Her INR was slightly low due  to this. Followed by coumadin clinic.  Pt with chronic permanent a fib. Alyssa Alyssa [name] has hx of CHB and has PPM.  Alyssa Alyssa [name] is on chronic coumadin for a fib.    Past Medical History  Diagnosis Date  . Hypothyroidism   . Osteoporosis   . Renal insufficiency   . Thyroid cancer ([place])   . Hx of breast cancer   . Atrial fibrillation ([place])     pacemaker, chronic anticoag  . MVP (mitral valve prolapse)   . Left inguinal hernia     direct  . IBS (irritable bowel syndrome)   . GERD (gastroesophageal reflux disease)   . Hyperkalemia   . Internal hemorrhoids   . Diverticulosis     Past Surgical History  Procedure Laterality Date  . Thyroidectomy    . Mastectomy  1982    Bilateral  . Tonsillectomy    . Pacemaker insertion    . Upper gastrointestinal endoscopy  09/06/2000    normal  . Colonoscopy  10/28/2005    internal hemorrhoids, diverticulosis (same as in 2002 and random bxs negative then)  . Ep implantable device N/A 02/26/2015    Procedure: PPM Generator Changeout;  Surgeon: [name], MD;  Location: [facility];  Service: Cardiovascular;  Laterality: N/A;     Current Outpatient Prescriptions  Medication Sig Dispense Refill  . acetaminophen (TYLENOL ARTHRITIS PAIN) 650 MG CR tablet Take 1,300 mg by mouth 2 (two)  times daily.      . fluticasone (FLONASE) 50 MCG/ACT nasal spray Place 2 sprays into both nostrils as needed for allergies or rhinitis.    Marland Kitchen levothyroxine (SYNTHROID, LEVOTHROID) 88 MCG tablet TAKE ONE TABLET BY MOUTH ONCE DAILY 90 tablet 1  . loperamide (IMODIUM A-D) 2 MG tablet Take 2 mg by mouth as needed for diarrhea or loose stools.    . pantoprazole (PROTONIX) 40 MG tablet TAKE ONE TABLET BY MOUTH ONCE DAILY 30 tablet 0  . warfarin (COUMADIN) 3 MG tablet TAKE AS DIRECTED BY  COUMADIN  CLINIC (Alyssa [name]: Take 1.5-3 mg by mouth daily at 6 PM. Take 1.5mg  on Tuesday & Sunday All other days take 1 tablet) 30 tablet 3  . furosemide (LASIX) 40 MG tablet Take  1 tablet (40 mg total) by mouth as directed. 30 tablet 3  . potassium chloride SA (K-DUR,KLOR-CON) 20 MEQ tablet Take 1 tablet (20 mEq total) by mouth as directed. 30 tablet 3   No current facility-administered medications for this visit.    Allergies:   Review of Alyssa [name]'s allergies indicates no known allergies.    Social History:  Alyssa Alyssa [name]  reports that Alyssa Alyssa [name] quit smoking about 61 years ago. Alyssa Alyssa [name] has never used smokeless tobacco. Alyssa Alyssa [name] reports that Alyssa Alyssa [name] does not drink alcohol or use illicit drugs.   Family History:  Alyssa Alyssa [name]'s family history includes Arthritis in her other; Colon cancer (age of onset: 58) in her father; Heart disease in her father; Hypertension in her other; Stroke in her mother. There is no history of Esophageal cancer, Pancreatic cancer, Kidney disease, Liver disease, or Diabetes.    ROS:  General:no colds or fevers, no weight changes Skin:no rashes or ulcers HEENT:no blurred vision, no congestion CV:see HPI PUL:see HPI GI:no diarrhea constipation or melena, no indigestion GU:no hematuria, no dysuria MS:no joint pain, no claudication Neuro:no syncope, no lightheadedness Endo:no diabetes, + thyroid disease  Wt Readings from Last 3 Encounters:  05/08/15 123 lb 9.6 oz (56.065 kg)  04/22/15 124 lb (56.246 kg)  03/19/15 130 lb (58.968 kg)     PHYSICAL EXAM: VS:  BP 140/78 mmHg  Pulse 82  Ht 5' 6.5" (1.689 m)  Wt 123 lb 9.6 oz (56.065 kg)  BMI 19.65 kg/m2  SpO2 97% , BMI Body mass index is 19.65 kg/(m^2). General:Pleasant affect, NAD Skin:Warm and dry, brisk capillary refill HEENT:normocephalic, sclera clear, mucus membranes moist Neck:supple, no JVD, no bruits  Heart:S1S2 RRR without murmur, gallup, rub or click Lungs:clear without rales, rhonchi, or wheezes [id number], non tender, + BS, do not palpate liver spleen or masses Ext:no lower ext edema, 2+ pedal pulses, 2+ radial pulses Neuro:alert and oriented, MAE, follows commands, + facial  symmetry    EKG:  EKG is NOT ordered today.    Recent Labs: 04/22/2015: ALT 14; BUN 23; Creatinine, Ser 0.98; Hemoglobin 12.5; Platelets 201.0; Potassium 4.3; Pro B Natriuretic peptide (BNP) 366.0*; Sodium 142; TSH 1.44    Lipid Panel    Component Value Date/Time   CHOL 155 02/11/2014 1521   TRIG 84.0 02/11/2014 1521   HDL 46.10 02/11/2014 1521   CHOLHDL 3 02/11/2014 1521   VLDL 16.8 02/11/2014 1521   LDLCALC 92 02/11/2014 1521       Other studies Reviewed: Additional studies/ records that were reviewed today include: current echo, labs OV notes and . ECHO 2009:  LEFT VENTRICLE: - Abnormal septal motion - Overall left ventricular systolic function was normal. - Left ventricular ejection fraction was  estimated to be 50 %. - There were no left ventricular regional wall motion    abnormalities. - Left ventricular wall thickness was normal.  AORTIC VALVE: - Alyssa aortic valve was trileaflet. - Aortic valve thickness was normal. - Alyssa aortic valve was mildly calcified. - There was normal aortic valve leaflet excursion.  Doppler interpretation(s): - Transaortic velocity was within Alyssa normal range. - There was no evidence for aortic valve stenosis. - There was no significant aortic valvular regurgitation.  AORTA: - Alyssa aortic root was normal in size.  MITRAL VALVE: - Mitral valve structure was normal. - There was normal mitral valve leaflet excursion.  Doppler interpretation(s): - Alyssa transmitral velocity was within Alyssa normal range. - There was no evidence for mitral stenosis. - There was mild mitral valvular regurgitation.  LEFT ATRIUM: - Alyssa left atrium was mild to moderately dilated.  PULMONARY VEINS: - Alyssa pulmonary veins were grossly normal.  RIGHT VENTRICLE: - Alyssa right ventricle was mildly dilated. - Right ventricular systolic function was normal.  PULMONIC VALVE: - Alyssa structure of Alyssa  pulmonic valve appeared to be normal.  Doppler interpretation(s): - Alyssa transpulmonic velocity was within Alyssa normal range. - There was no pulmonic valve stenosis. - There was no significant pulmonic regurgitation.  TRICUSPID VALVE: - Alyssa tricuspid valve structure was normal. - Tricuspid leaflet excursion was normal.  Doppler interpretation(s): - Alyssa transtricuspid velocity was within Alyssa normal range. - There was no evidence for tricuspid stenosis. - There was moderate tricuspid valvular regurgitation.  PULMONARY ARTERY: - Alyssa pulmonary artery morphology appeared normal.  RIGHT ATRIUM: - Alyssa right atrium was mild to moderately dilated.  SYSTEMIC VEINS: - Alyssa inferior vena cava was normal.  PERICARDIUM: - There was no pericardial effusion. - Alyssa pericardium was normal in appearance.  ---------------------------------------------------------------  SUMMARY - Abnormal septal motion Overall left ventricular systolic function    was normal. Left ventricular ejection fraction was estimated    to be 50 %. There were no left ventricular regional wall    motion abnormalities. - Alyssa aortic valve was mildly calcified. - There was mild mitral valvular regurgitation. - Alyssa left atrium was mild to moderately dilated. - Alyssa pulmonary veins were grossly normal. - Alyssa right ventricle was mildly dilated. - There was moderate tricuspid valvular regurgitation. - Alyssa right atrium was mild to moderately dilated.  IMPRESSIONS - There was no echocardiographic evidence for a cardiac source of    embolism.   ASSESSMENT AND PLAN:  1.  Moderate Pulmonary HTN with PA pressure of 52 up from 35 in 2009. Has had lower extremity edema and some SOB.  Discussed with Dr. Acie Fredrickson and have added Lasix 40 daily with Kdur 20 meq daily.  Alyssa Alyssa [name] will call us Monday to let us know how Alyssa Alyssa [name] is feeling to determine chronic dose of lasix.  Alyssa Alyssa [name]  has follow up with Dr. Caryl Comes 05/27/15.  BNP was elevated   2.severe TR  3. Permanent atrial fib on coumadin followed in coumadin clinic  4.  PPM for CHB - new gen change in Jan. 2017.    Current medicines are reviewed with Alyssa Alyssa [name] today.  Alyssa Alyssa [name] Has no concerns regarding medicines.  Alyssa following changes have been made:  See above Labs/ tests ordered today include:see above  Disposition:   FU:  see above  Signed, Isaiah Serge, NP  05/08/2015 5:19 PM    [facility] [place], [place]  [place], [place] Phone: [phone]; Fax: [phone]

## 2015-05-12 ENCOUNTER — Telehealth: Payer: Self-pay

## 2015-05-12 NOTE — Telephone Encounter (Signed)
Continue lasix and K+  Thanks.

## 2015-05-12 NOTE — Telephone Encounter (Signed)
Called patient back and informed her of Cecilie Kicks NP message. Per Cecilie Kicks NP, continue lasix and K+. Patient verbalized understanding.

## 2015-05-12 NOTE — Telephone Encounter (Signed)
Alyssa [name],  I called and spoke with the patient. She states she has had some cramping in her hands and legs over the last 3 days that she has been taking her lasix. She has been taking her potassium as well. She reports that her breathing is fine today and she is having no weight gain/ swelling. Do you want her on lasix/ furosemide daily or can she just take PRN due to cramping and lack of symptoms. She is scheduled to follow up with Dr. Caryl Comes on 4/18.  Thanks! Nira Conn

## 2015-05-12 NOTE — Telephone Encounter (Signed)
Patient calling to report that she is doing fine after taking Lasix 40 mg for three days. Patient's only complaint is some cramping in her legs and hands. Patient would like a call back from Dr. Olin Pia nurse. Will forward to Cecilie Kicks NP for further advisement.

## 2015-05-20 ENCOUNTER — Ambulatory Visit (INDEPENDENT_AMBULATORY_CARE_PROVIDER_SITE_OTHER): Payer: Medicare Other | Admitting: Pharmacist

## 2015-05-20 DIAGNOSIS — Z5181 Encounter for therapeutic drug level monitoring: Secondary | ICD-10-CM

## 2015-05-20 DIAGNOSIS — I4891 Unspecified atrial fibrillation: Secondary | ICD-10-CM

## 2015-05-20 LAB — POCT INR: INR: 1.6

## 2015-05-27 ENCOUNTER — Ambulatory Visit (INDEPENDENT_AMBULATORY_CARE_PROVIDER_SITE_OTHER): Payer: Medicare Other | Admitting: *Deleted

## 2015-05-27 ENCOUNTER — Ambulatory Visit (INDEPENDENT_AMBULATORY_CARE_PROVIDER_SITE_OTHER): Payer: Medicare Other | Admitting: Internal Medicine

## 2015-05-27 ENCOUNTER — Encounter: Payer: Self-pay | Admitting: Internal Medicine

## 2015-05-27 VITALS — BP 128/67 | HR 72 | Ht 66.5 in | Wt 124.1 lb

## 2015-05-27 DIAGNOSIS — I442 Atrioventricular block, complete: Secondary | ICD-10-CM | POA: Diagnosis not present

## 2015-05-27 DIAGNOSIS — I482 Chronic atrial fibrillation, unspecified: Secondary | ICD-10-CM

## 2015-05-27 DIAGNOSIS — Z5181 Encounter for therapeutic drug level monitoring: Secondary | ICD-10-CM

## 2015-05-27 DIAGNOSIS — I272 Other secondary pulmonary hypertension: Secondary | ICD-10-CM | POA: Diagnosis not present

## 2015-05-27 DIAGNOSIS — I4891 Unspecified atrial fibrillation: Secondary | ICD-10-CM

## 2015-05-27 DIAGNOSIS — Z950 Presence of cardiac pacemaker: Secondary | ICD-10-CM

## 2015-05-27 LAB — CUP PACEART INCLINIC DEVICE CHECK
Battery Impedance: 100 Ohm
Battery Voltage: 2.79 V
Brady Statistic RV Percent Paced: 100 %
Implantable Lead Implant Date: 20000921
Implantable Lead Implant Date: 20000921
Implantable Lead Location: 753859
Lead Channel Impedance Value: 557 Ohm
Lead Channel Impedance Value: 67 Ohm
Lead Channel Pacing Threshold Amplitude: 0.75 V
Lead Channel Pacing Threshold Amplitude: 0.875 V
Lead Channel Setting Pacing Pulse Width: 0.4 ms
MDC IDC LEAD LOCATION: 753860
MDC IDC MSMT BATTERY REMAINING LONGEVITY: 132 mo
MDC IDC MSMT LEADCHNL RV PACING THRESHOLD PULSEWIDTH: 0.4 ms
MDC IDC MSMT LEADCHNL RV PACING THRESHOLD PULSEWIDTH: 0.4 ms
MDC IDC SESS DTM: 20170418161847
MDC IDC SET LEADCHNL RV PACING AMPLITUDE: 2.5 V
MDC IDC SET LEADCHNL RV SENSING SENSITIVITY: 2.8 mV

## 2015-05-27 LAB — POCT INR: INR: 1.9

## 2015-05-27 MED ORDER — POTASSIUM CHLORIDE ER 10 MEQ PO TBCR: EXTENDED_RELEASE_TABLET | ORAL | Status: DC

## 2015-05-27 NOTE — Patient Instructions (Signed)
Medication Instructions: 1) Decrease lasix (furosemide) to 40 mg one tablet by mouth every other day 2) Take potassium 10 meq one tablet by mouth twice daily  Labwork: - none  Procedures/Testing: -none  Follow-Up: - Remote monitoring is used to monitor your Pacemaker of ICD from home. This monitoring reduces the number of office visits required to check your device to one time per year. It allows [place] to keep an eye on the functioning of your device to ensure it is working properly. You are scheduled for a device check from home on 08/26/15. You may send your transmission at any time that day. If you have a wireless device, the transmission will be sent automatically. After your physician reviews your transmission, you will receive a postcard with your next transmission date.  - Your physician wants you to follow-up in: 9 months with Chanetta Marshall, NP for Dr. Caryl Comes. You will receive a reminder letter in the mail two months in advance. If you don't receive a letter, please call our office to schedule the follow-up appointment.   Any Additional Special Instructions Will Be Listed Below (If Applicable).     If you need a refill on your cardiac medications before your next appointment, please call your pharmacy.

## 2015-05-27 NOTE — Progress Notes (Signed)
Patient Care Team: [name], MD as PCP - General [name], MD (Cardiology)   HPI  Alyssa [name] is a 82 y.o. female seen in followup for complete heart block. She is status post pacemaker implantation. She has atrial fibrillation which is permanent. She is taking and tolerating her Coumadin. There have been no intercurrent problems with exercise intolerance   She was seen recently by her PCP with complaints of lower extremity edema and dyspnea. She underwent echocardiography>>Normal LV function; severe biatrial enlargement; mild RVE; severe  TR with moderately elevated pulmonary pressure; small pericardial  effusion.  She was started on a diuretic with subsequent cramping in hands/feet    she is a struggle with irritable bowel but otherwise is doing well   she ended up on anticoagulation with an INR of greater than 10   Past Medical History  Diagnosis Date  . Hypothyroidism   . Osteoporosis   . Renal insufficiency   . Thyroid cancer ([place])   . Hx of breast cancer   . Atrial fibrillation ([place])     pacemaker, chronic anticoag  . MVP (mitral valve prolapse)   . Left inguinal hernia     direct  . IBS (irritable bowel syndrome)   . GERD (gastroesophageal reflux disease)   . Hyperkalemia   . Internal hemorrhoids   . Diverticulosis     Past Surgical History  Procedure Laterality Date  . Thyroidectomy    . Mastectomy  1982    Bilateral  . Tonsillectomy    . Pacemaker insertion    . Upper gastrointestinal endoscopy  09/06/2000    normal  . Colonoscopy  10/28/2005    internal hemorrhoids, diverticulosis (same as in 2002 and random bxs negative then)  . Ep implantable device N/A 02/26/2015    Procedure: PPM Generator Changeout;  Surgeon: [name], MD;  Location: [facility];  Service: Cardiovascular;  Laterality: N/A;    Current Outpatient Prescriptions  Medication Sig Dispense Refill  . acetaminophen (TYLENOL ARTHRITIS PAIN) 650 MG CR tablet Take  1,300 mg by mouth 2 (two) times daily.      . fluticasone (FLONASE) 50 MCG/ACT nasal spray Place 2 sprays into both nostrils as needed for allergies or rhinitis.    . furosemide (LASIX) 40 MG tablet Take 1 tablet (40 mg total) by mouth as directed. 30 tablet 3  . levothyroxine (SYNTHROID, LEVOTHROID) 88 MCG tablet TAKE ONE TABLET BY MOUTH ONCE DAILY 90 tablet 1  . loperamide (IMODIUM A-D) 2 MG tablet Take 2 mg by mouth as needed for diarrhea or loose stools.    . potassium chloride SA (K-DUR,KLOR-CON) 20 MEQ tablet Take 1 tablet (20 mEq total) by mouth as directed. 30 tablet 3  . warfarin (COUMADIN) 3 MG tablet TAKE AS DIRECTED BY  COUMADIN  CLINIC (Patient taking differently: Take 1.5-3 mg by mouth daily at 6 PM. Take 1.5mg  on Tuesday & Sunday All other days take 1 tablet) 30 tablet 3   No current facility-administered medications for this visit.    No Known Allergies  Review of Systems negative except from HPI and PMH  Physical Exam BP 128/67 mmHg  Pulse 72  Ht 5' 6.5" (1.689 m)  Wt 124 lb 1.9 oz (56.3 kg)  BMI 19.74 kg/m2 Well developed and well nourished in no acute distress HENT normal E scleral and icterus clear Neck Supple JVP flat; carotids brisk and full Clear to ausculation Device pocket well healed; without hematoma or erythema.  There is no tethering   Regular rate and rhythm, no murmurs gallops or rub Soft with active bowel sounds No clubbing cyanosis no Edema Alert and oriented, grossly normal motor and sensory function Skin Warm and Dry  ECG demonstrates ventricular pacing at 66 with underlying atrial fibrillation Intervals-/15/44 Axis left -81 Assessment and  Plan  Complete heart block  Atrial fibrillation    diarrhea and colitis  HFpEF  TR and RVE  Pacemaker-Medtronic The patient's device was interrogated.  The information was reviewed. No changes were made in the programming.    Continue anticoagulation   with cramping, we will change her  diuretic to every other day. We will change her potassium to 10 twice a day to help with swallowing.   I have been in touch with Dr. Carlean Purl asking the question as to whether she might have lymphocytic colitis whether she, like my mom, might not benefit from Pepto-Bismol

## 2015-05-30 ENCOUNTER — Telehealth: Payer: Self-pay

## 2015-05-30 NOTE — Telephone Encounter (Signed)
Left message for her to call us back to get an update.

## 2015-05-30 NOTE — Telephone Encounter (Signed)
-----   Message from Gatha Mayer, MD sent at 05/30/2015  1:22 PM EDT ----- Regarding: update Please ask how she is doing with diarrhea

## 2015-05-30 NOTE — Telephone Encounter (Signed)
Alyssa [name] said she is all better, Thanks to you Sir.

## 2015-06-02 ENCOUNTER — Telehealth: Payer: Self-pay | Admitting: Internal Medicine

## 2015-06-02 NOTE — Telephone Encounter (Addendum)
Patient advised if dentist finds it necessary then ok to take antibiotics for the shortest course possible. She is having a cap replaced and doesn't think she will need it.  She will talk with her dentist prior to taking any antibiotics.

## 2015-06-02 NOTE — Telephone Encounter (Signed)
Patient asks if she would benefit from VSL3 or Kijimea (new IBS medical food?

## 2015-06-05 NOTE — Telephone Encounter (Signed)
Patient notified

## 2015-06-05 NOTE — Telephone Encounter (Signed)
She is welcome to try either of those - both are probiotics VSL # 3 is cheapest at [facility]  I have experience with that and think it is a good probiotic  No experience with Kijmea.   In general if feeling ok does not necessarily need a probiotic but in her case might help prevent IBS flares.

## 2015-06-10 ENCOUNTER — Ambulatory Visit (INDEPENDENT_AMBULATORY_CARE_PROVIDER_SITE_OTHER): Payer: Medicare Other | Admitting: *Deleted

## 2015-06-10 DIAGNOSIS — Z5181 Encounter for therapeutic drug level monitoring: Secondary | ICD-10-CM

## 2015-06-10 DIAGNOSIS — I4891 Unspecified atrial fibrillation: Secondary | ICD-10-CM | POA: Diagnosis not present

## 2015-06-10 LAB — POCT INR: INR: 2

## 2015-06-17 ENCOUNTER — Other Ambulatory Visit: Payer: Self-pay | Admitting: Internal Medicine

## 2015-06-26 ENCOUNTER — Ambulatory Visit (INDEPENDENT_AMBULATORY_CARE_PROVIDER_SITE_OTHER): Payer: Medicare Other

## 2015-06-26 DIAGNOSIS — I4891 Unspecified atrial fibrillation: Secondary | ICD-10-CM | POA: Diagnosis not present

## 2015-06-26 DIAGNOSIS — Z5181 Encounter for therapeutic drug level monitoring: Secondary | ICD-10-CM

## 2015-06-26 LAB — POCT INR: INR: 2.5

## 2015-07-16 ENCOUNTER — Other Ambulatory Visit: Payer: Self-pay | Admitting: Internal Medicine

## 2015-07-17 ENCOUNTER — Ambulatory Visit (INDEPENDENT_AMBULATORY_CARE_PROVIDER_SITE_OTHER): Payer: Medicare Other | Admitting: *Deleted

## 2015-07-17 DIAGNOSIS — I4891 Unspecified atrial fibrillation: Secondary | ICD-10-CM | POA: Diagnosis not present

## 2015-07-17 DIAGNOSIS — Z5181 Encounter for therapeutic drug level monitoring: Secondary | ICD-10-CM | POA: Diagnosis not present

## 2015-07-17 DIAGNOSIS — H524 Presbyopia: Secondary | ICD-10-CM | POA: Diagnosis not present

## 2015-07-17 LAB — POCT INR: INR: 2.2

## 2015-08-14 ENCOUNTER — Ambulatory Visit (INDEPENDENT_AMBULATORY_CARE_PROVIDER_SITE_OTHER): Payer: Medicare Other | Admitting: *Deleted

## 2015-08-14 ENCOUNTER — Telehealth: Payer: Self-pay | Admitting: Internal Medicine

## 2015-08-14 DIAGNOSIS — Z5181 Encounter for therapeutic drug level monitoring: Secondary | ICD-10-CM | POA: Diagnosis not present

## 2015-08-14 DIAGNOSIS — I4891 Unspecified atrial fibrillation: Secondary | ICD-10-CM

## 2015-08-14 LAB — POCT INR: INR: 1.9

## 2015-08-14 NOTE — Telephone Encounter (Signed)
LMTCB//sss 

## 2015-08-14 NOTE — Telephone Encounter (Signed)
New Message:   Pt had a a fainting spell on Monday,she still is not feeling right. Should she do her transmitting now? Please cal asap to advise.

## 2015-08-14 NOTE — Telephone Encounter (Signed)
Follow-up      1. Has your device fired?no   2. Is you device beeping? no  3. Are you experiencing draining or swelling at device site? No  4. Are you calling to see if we received your device transmission? Patient had a fainting spell on Monday and wants to know since she does not feel right should she down load a transmission  5. Have you passed out? no

## 2015-08-14 NOTE — Telephone Encounter (Signed)
F/U   Pt returning call. Please call. IB

## 2015-08-14 NOTE — Telephone Encounter (Signed)
I spoke with the patient. She was out on Monday running errands. She went to the post office and was in line for about 20 minutes. She then went out in to the heat and felt pre-syncopal. She did not pass out completely, but did fall.  She had not been drinking much. I advised her she most likely had a drop in her BP after standing for a while then entering the heat.  She has had not episodes since Monday. I have advised her she may send a transmission for [place] to review. She would prefer to do this. I advised her I will send a message to the device clinic to look out for this and call her once received with the report. She was very Patent attorney.

## 2015-08-14 NOTE — Telephone Encounter (Signed)
Informed patient that remote transmission was received. Normal device function. No episodes recorded. Presenting rhythm: AF/Vp/Vs.   I encouraged patient to stay well hydrated and to call back with any further sx's. Patient voiced understanding and appreciation of call.

## 2015-08-15 ENCOUNTER — Telehealth: Payer: Self-pay | Admitting: Internal Medicine

## 2015-08-15 NOTE — Telephone Encounter (Signed)
Will send to device clinic  

## 2015-08-15 NOTE — Telephone Encounter (Signed)
Patient wanted to report her current BP 149/78. Encouraged patient to just take her BP once daily and when she is feeling faint. Patient stated she feels fine now.

## 2015-08-15 NOTE — Telephone Encounter (Signed)
Patient called back. Patient stated that she feels better with drinking some water. Patient stated her BP is 160/78 HR 71. Patient thinks her BP is up because she is stressed right now. Encouraged patient to retake her BP in a few hours to see if there is a change. While on the phone I discovered that the patient is taking her Lasix every day instead of every other day, as instructed at her last appointment. Patient is going to take her Lasix every other day and see how she feels. Encouraged patient to call with any other concerns or questions. Will forward to Dr. Caryl Comes for further advisement.

## 2015-08-15 NOTE — Telephone Encounter (Signed)
Called pt to send in a remote transmission, pt stated she will. Will call pt back once transmission is received and reviewed.   Transmission reviewed no episodes. Pt aware. Routed back to triage.

## 2015-08-15 NOTE — Telephone Encounter (Signed)
F/u Message  Pt call requesting to speak with RN about questions from previous message. Please call back to discuss

## 2015-08-15 NOTE — Telephone Encounter (Signed)
New message      Pt verbalized that she is returning call to [facility]

## 2015-08-15 NOTE — Telephone Encounter (Signed)
Reasonable to reduce her diuretics  Please arrange a pacemaker transimission   thnks

## 2015-08-15 NOTE — Telephone Encounter (Signed)
Pt was having trouble with SOB -talked to [facility] yesterday about this and still not better today-feels "woozy"  pls advise [phone]

## 2015-08-15 NOTE — Telephone Encounter (Signed)
Called patient back. Patient stated she has been SOB off and on for about a week. This morning patient stated she felt woozy, and just not feeling right. Patient states she has had some sinus issues and headaches, but nothing bad. Patient stated she did have a fall with her pre-syncopal episode. Patient stated she landed on her knees. Patient stated she did have some bleeding from her knees, but they are healing fine. Patient's INR was 1.9 yesterday. Asked patient what her BP is running. Patient stated she does not know, but she can have her neighbor check it. Patient also stated that she felt better yesterday after she hydrated with water. Encouraged patient to hydrate, have her BP check, and to give our office a call back with the results. Patient agreed to plan.

## 2015-08-20 ENCOUNTER — Telehealth: Payer: Self-pay | Admitting: Internal Medicine

## 2015-08-20 NOTE — Telephone Encounter (Signed)
Mrs. Prem is calling because she is having some bleeding problems and is wanting to speak to someone about it . Please call

## 2015-08-20 NOTE — Telephone Encounter (Signed)
I called and spoke with the patient. She called last week with reports of elevation in her BP. She had a syncopal spell on 7/6 that seems related to dehydration. She was instructed on 7/7 to reduce lasix to QOD.  She has been SOB since decreasing the dose, but no visible swelling. She does not weigh at home. She has been taking potassium 10 meq once daily on the days she takes her lasix. She reports BP's today have been 168/82 & 149/78. I advised her I will review with Dr. Caryl Comes and call her back.

## 2015-08-20 NOTE — Telephone Encounter (Signed)
Reviewed the patient's symptoms with Dr. Caryl Comes- ok to resume lasix 40 mg once daily and potassium 10 meq once daily. He would like to see her back in the office next week to follow up symptoms and BP readings.  I have discussed the above recommendations with the patient. She is agreeable and voices understanding of the changes.  Appt scheduled for 720/17 at 1:45 pm with  Dr. Caryl Comes.

## 2015-08-21 ENCOUNTER — Telehealth: Payer: Self-pay | Admitting: Internal Medicine

## 2015-08-21 NOTE — Telephone Encounter (Signed)
NEW MESSAGE  Pt c/o medication issue:  1. Name of Medication: LASIX  2. How are you currently taking this medication (dosage and times per day)? 40MG  1 DAY 3. Are you having a reaction (difficulty breathing--STAT)? NO   4. What is your medication issue? Pt wants rn to go over instructions on when, how much to take

## 2015-08-21 NOTE — Telephone Encounter (Signed)
I called and spoke with the patient. She states she took lasix last night about 11 pm because she couldn't sleep due to her breathing. She feels ok right now, maybe just slightly winded. She is trying to get back on a schedule of taking her lasix in the morning. I advised her she may take the 40 mg dose now, or if her symptoms are stable, she can take lasix 20 mg now and resume lasix 40 mg daily in the am along with her K+. She is agreeable.

## 2015-08-28 ENCOUNTER — Ambulatory Visit (INDEPENDENT_AMBULATORY_CARE_PROVIDER_SITE_OTHER): Payer: Medicare Other | Admitting: Internal Medicine

## 2015-08-28 ENCOUNTER — Encounter: Payer: Self-pay | Admitting: Internal Medicine

## 2015-08-28 VITALS — BP 142/76 | HR 84 | Ht 66.75 in | Wt 130.4 lb

## 2015-08-28 DIAGNOSIS — I5030 Unspecified diastolic (congestive) heart failure: Secondary | ICD-10-CM

## 2015-08-28 DIAGNOSIS — I482 Chronic atrial fibrillation, unspecified: Secondary | ICD-10-CM

## 2015-08-28 DIAGNOSIS — Z950 Presence of cardiac pacemaker: Secondary | ICD-10-CM

## 2015-08-28 DIAGNOSIS — I442 Atrioventricular block, complete: Secondary | ICD-10-CM

## 2015-08-28 LAB — CUP PACEART INCLINIC DEVICE CHECK
Battery Impedance: 101 Ohm
Battery Remaining Longevity: 132 mo
Date Time Interrogation Session: 20170720151430
Implantable Lead Implant Date: 20000921
Lead Channel Impedance Value: 67 Ohm
Lead Channel Pacing Threshold Pulse Width: 0.4 ms
Lead Channel Setting Pacing Pulse Width: 0.4 ms
Lead Channel Setting Sensing Sensitivity: 2.8 mV
MDC IDC LEAD IMPLANT DT: 20000921
MDC IDC LEAD LOCATION: 753859
MDC IDC LEAD LOCATION: 753860
MDC IDC MSMT BATTERY VOLTAGE: 2.8 V
MDC IDC MSMT LEADCHNL RV IMPEDANCE VALUE: 568 Ohm
MDC IDC MSMT LEADCHNL RV PACING THRESHOLD AMPLITUDE: 0.75 V
MDC IDC MSMT LEADCHNL RV PACING THRESHOLD AMPLITUDE: 0.75 V
MDC IDC MSMT LEADCHNL RV PACING THRESHOLD PULSEWIDTH: 0.4 ms
MDC IDC MSMT LEADCHNL RV SENSING INTR AMPL: 11.2 mV
MDC IDC SET LEADCHNL RV PACING AMPLITUDE: 2.5 V
MDC IDC STAT BRADY RV PERCENT PACED: 99 %

## 2015-08-28 NOTE — Progress Notes (Signed)
Patient Care Team: [name], MD as PCP - General [name], MD (Cardiology)   HPI  Alyssa [name] is a 82 y.o. female seen in followup for complete heart block. She is status post pacemaker implantation. She has atrial fibrillation which is permanent. She is taking and tolerating her Coumadin. There have been no intercurrent problems with exercise intolerance   She was seen recently by her PCP with complaints of lower extremity edema and dyspnea. She underwent echocardiography>>Normal LV function; severe biatrial enlargement; mild RVE; severe  TR with moderately elevated pulmonary pressure; small pericardial  effusion.  She had a syncopal episode after prolonged standing and in the heat. Because of that somebody thought she might have been dehydrated and recommended that her diuretics be reduced. This was followed by increasing symptoms of shortness of breath and nocturnal dyspnea. She called and we suggested she increase her furosemide back to daily from every other day. With this her symptoms have largely resolved. She has some ongoing problems with dyspnea particularly at the end of the day.   Past Medical History  Diagnosis Date  . Hypothyroidism   . Osteoporosis   . Renal insufficiency   . Thyroid cancer ([place])   . Hx of breast cancer   . Atrial fibrillation ([place])     pacemaker, chronic anticoag  . MVP (mitral valve prolapse)   . Left inguinal hernia     direct  . IBS (irritable bowel syndrome)   . GERD (gastroesophageal reflux disease)   . Hyperkalemia   . Internal hemorrhoids   . Diverticulosis     Past Surgical History  Procedure Laterality Date  . Thyroidectomy    . Mastectomy  1982    Bilateral  . Tonsillectomy    . Pacemaker insertion    . Upper gastrointestinal endoscopy  09/06/2000    normal  . Colonoscopy  10/28/2005    internal hemorrhoids, diverticulosis (same as in 2002 and random bxs negative then)  . Ep implantable device N/A 02/26/2015   Procedure: PPM Generator Changeout;  Surgeon: [name], MD;  Location: [facility];  Service: Cardiovascular;  Laterality: N/A;    Current Outpatient Prescriptions  Medication Sig Dispense Refill  . acetaminophen (TYLENOL ARTHRITIS PAIN) 650 MG CR tablet Take 1,300 mg by mouth 2 (two) times daily.      . furosemide (LASIX) 40 MG tablet Take one tablet (40 mg) by mouth once daily    . levothyroxine (SYNTHROID, LEVOTHROID) 88 MCG tablet TAKE ONE TABLET BY MOUTH ONCE DAILY 90 tablet 1  . loperamide (IMODIUM A-D) 2 MG tablet Take 2 mg by mouth 3 (three) times daily as needed for diarrhea or loose stools.     . potassium chloride (K-DUR) 10 MEQ tablet Take 1 tablet (10 mEq total) by mouth daily.    Marland Kitchen warfarin (COUMADIN) 3 MG tablet USE AS DIRECTED BY COUMADIN CLINIC 30 tablet 3   No current facility-administered medications for this visit.    No Known Allergies  Review of Systems negative except from HPI and PMH  Physical Exam BP 142/76 mmHg  Pulse 84  Ht 5' 6.75" (1.695 m)  Wt 130 lb 6.4 oz (59.149 kg)  BMI 20.59 kg/m2  SpO2 97% Well developed and well nourished in no acute distress HENT normal E scleral and icterus clear Neck Supple JVP flat; carotids brisk and full Clear to ausculation Device pocket well healed; without hematoma or erythema.  There is no tethering   Regular  rate and rhythm, no murmurs gallops or rub Soft with active bowel sounds No clubbing cyanosis no Edema Alert and oriented, grossly normal motor and sensory function Skin Warm and Dry  ECG demonstrates ventricular pacing at 90 with underlying atrial fibrillation Intervals-/15/42 Axis left -85 Assessment and  Plan  Complete heart block  Atrial fibrillation   HFpEF  TR and RVE  Syncope  Pacemaker-Medtronic The patient's device was interrogated.  The information was reviewed. No changes were made in the programming.    Continue anticoagulation  Will have her take furosemide 40 mg  daily but for 3/5 days will have her take BID  Will check BMET in about 2 weeks

## 2015-08-28 NOTE — Patient Instructions (Signed)
Medication Instructions: - Your physician has recommended you make the following change in your medication:  1) Increase lasix (furosemide) to 40 mg twice daily three of five days  Labwork: - none  Procedures/Testing: - none  Follow-Up: - Your physician recommends that you schedule a follow-up appointment in: 3 months with Tommye Standard, PA for Dr. Caryl Comes  - Your physician wants you to follow-up in: 6 months with Dr. Caryl Comes. You will receive a reminder letter in the mail two months in advance. If you don't receive a letter, please call our office to schedule the follow-up appointment. Any Additional Special Instructions Will Be Listed Below (If Applicable).     If you need a refill on your cardiac medications before your next appointment, please call your pharmacy.

## 2015-09-04 ENCOUNTER — Encounter: Payer: Self-pay | Admitting: Internal Medicine

## 2015-09-04 ENCOUNTER — Ambulatory Visit (INDEPENDENT_AMBULATORY_CARE_PROVIDER_SITE_OTHER): Payer: Medicare Other | Admitting: *Deleted

## 2015-09-04 ENCOUNTER — Ambulatory Visit (INDEPENDENT_AMBULATORY_CARE_PROVIDER_SITE_OTHER): Payer: Medicare Other | Admitting: Internal Medicine

## 2015-09-04 ENCOUNTER — Other Ambulatory Visit: Payer: Self-pay

## 2015-09-04 VITALS — BP 144/86 | HR 89 | Temp 98.0°F | Resp 16 | Wt 130.0 lb

## 2015-09-04 DIAGNOSIS — I4891 Unspecified atrial fibrillation: Secondary | ICD-10-CM | POA: Diagnosis not present

## 2015-09-04 DIAGNOSIS — Z5181 Encounter for therapeutic drug level monitoring: Secondary | ICD-10-CM

## 2015-09-04 DIAGNOSIS — I10 Essential (primary) hypertension: Secondary | ICD-10-CM

## 2015-09-04 LAB — POCT INR: INR: 1.7

## 2015-09-04 MED ORDER — TELMISARTAN 20 MG PO TABS: 20.0000 mg | ORAL_TABLET | Freq: Every day | ORAL | 3 refills | Status: DC

## 2015-09-04 MED ORDER — FUROSEMIDE 40 MG PO TABS: ORAL_TABLET | ORAL | 6 refills | Status: DC

## 2015-09-04 NOTE — Patient Instructions (Signed)
Hypertension Hypertension, commonly called high blood pressure, is when the force of blood pumping through your arteries is too strong. Your arteries are the blood vessels that carry blood from your heart throughout your body. A blood pressure reading consists of a higher number over a lower number, such as 110/72. The higher number (systolic) is the pressure inside your arteries when your heart pumps. The lower number (diastolic) is the pressure inside your arteries when your heart relaxes. Ideally you want your blood pressure below 120/80. Hypertension forces your heart to work harder to pump blood. Your arteries may become narrow or stiff. Having untreated or uncontrolled hypertension can cause heart attack, stroke, kidney disease, and other problems. RISK FACTORS Some risk factors for high blood pressure are controllable. Others are not.  Risk factors you cannot control include:   Race. You may be at higher risk if you are African American.  Age. Risk increases with age.  Gender. Men are at higher risk than women before age 45 years. After age 65, women are at higher risk than men. Risk factors you can control include:  Not getting enough exercise or physical activity.  Being overweight.  Getting too much fat, sugar, calories, or salt in your diet.  Drinking too much alcohol. SIGNS AND SYMPTOMS Hypertension does not usually cause signs or symptoms. Extremely high blood pressure (hypertensive crisis) may cause headache, anxiety, shortness of breath, and nosebleed. DIAGNOSIS To check if you have hypertension, your health care provider will measure your blood pressure while you are seated, with your arm held at the level of your heart. It should be measured at least twice using the same arm. Certain conditions can cause a difference in blood pressure between your right and left arms. A blood pressure reading that is higher than normal on one occasion does not mean that you need treatment. If  it is not clear whether you have high blood pressure, you may be asked to return on a different day to have your blood pressure checked again. Or, you may be asked to monitor your blood pressure at home for 1 or more weeks. TREATMENT Treating high blood pressure includes making lifestyle changes and possibly taking medicine. Living a healthy lifestyle can help lower high blood pressure. You may need to change some of your habits. Lifestyle changes may include:  Following the DASH diet. This diet is high in fruits, vegetables, and whole grains. It is low in salt, red meat, and added sugars.  Keep your sodium intake below 2,300 mg per day.  Getting at least 30-45 minutes of aerobic exercise at least 4 times per week.  Losing weight if necessary.  Not smoking.  Limiting alcoholic beverages.  Learning ways to reduce stress. Your health care provider may prescribe medicine if lifestyle changes are not enough to get your blood pressure under control, and if one of the following is true:  You are 18-59 years of age and your systolic blood pressure is above 140.  You are 60 years of age or older, and your systolic blood pressure is above 150.  Your diastolic blood pressure is above 90.  You have diabetes, and your systolic blood pressure is over 140 or your diastolic blood pressure is over 90.  You have kidney disease and your blood pressure is above 140/90.  You have heart disease and your blood pressure is above 140/90. Your personal target blood pressure may vary depending on your medical conditions, your age, and other factors. HOME CARE INSTRUCTIONS    Have your blood pressure rechecked as directed by your health care provider.   Take medicines only as directed by your health care provider. Follow the directions carefully. Blood pressure medicines must be taken as prescribed. The medicine does not work as well when you skip doses. Skipping doses also puts you at risk for  problems.  Do not smoke.   Monitor your blood pressure at home as directed by your health care provider. SEEK MEDICAL CARE IF:   You think you are having a reaction to medicines taken.  You have recurrent headaches or feel dizzy.  You have swelling in your ankles.  You have trouble with your vision. SEEK IMMEDIATE MEDICAL CARE IF:  You develop a severe headache or confusion.  You have unusual weakness, numbness, or feel faint.  You have severe chest or abdominal pain.  You vomit repeatedly.  You have trouble breathing. MAKE SURE YOU:   Understand these instructions.  Will watch your condition.  Will get help right away if you are not doing well or get worse.   This information is not intended to replace advice given to you by your health care provider. Make sure you discuss any questions you have with your health care provider.   Document Released: 01/25/2005 Document Revised: 06/11/2014 Document Reviewed: 11/17/2012 Elsevier Interactive Patient Education 2016 [employer]  

## 2015-09-04 NOTE — Progress Notes (Signed)
Subjective:  Patient ID: Alyssa [name], female    DOB: 1925/01/24  Age: 82 y.o. MRN: [id number]  CC: Hypertension   HPI Alyssa [name] presents for concerns about her BP. Her SBP has been as high as 160 at home, she tells me her cardiologist told her to keep her SBP <135. She has had a few episodes of shortness of breath but denies dyspnea on exertion, edema, fatigue, palpitations, chest pain, headache, or blurred vision.  Outpatient Medications Prior to Visit  Medication Sig Dispense Refill  . acetaminophen (TYLENOL ARTHRITIS PAIN) 650 MG CR tablet Take 1,300 mg by mouth 2 (two) times daily.      . furosemide (LASIX) 40 MG tablet Take one tablet (40 mg) by mouth once daily 60 tablet 6  . levothyroxine (SYNTHROID, LEVOTHROID) 88 MCG tablet TAKE ONE TABLET BY MOUTH ONCE DAILY 90 tablet 1  . loperamide (IMODIUM A-D) 2 MG tablet Take 2 mg by mouth 3 (three) times daily as needed for diarrhea or loose stools.     . potassium chloride (K-DUR) 10 MEQ tablet Take 1 tablet (10 mEq total) by mouth daily.    Marland Kitchen warfarin (COUMADIN) 3 MG tablet USE AS DIRECTED BY COUMADIN CLINIC 30 tablet 3   No facility-administered medications prior to visit.     ROS Review of Systems  Constitutional: Negative for activity change, diaphoresis and unexpected weight change.  HENT: Negative.   Eyes: Negative.  Negative for visual disturbance.  Respiratory: Positive for shortness of breath. Negative for cough, choking, chest tightness, wheezing and stridor.   Cardiovascular: Negative for chest pain, palpitations and leg swelling.  Gastrointestinal: Negative.  Negative for abdominal pain.  Endocrine: Negative.   Genitourinary: Negative.  Negative for difficulty urinating, dysuria and hematuria.  Musculoskeletal: Negative.  Negative for back pain, neck pain and neck stiffness.  Skin: Negative for rash.  Allergic/Immunologic: Negative.   Neurological: Negative.  Negative for dizziness, tremors, seizures, weakness,  light-headedness, numbness and headaches.  Hematological: Negative for adenopathy. Does not bruise/bleed easily.  Psychiatric/Behavioral: Negative.     Objective:  BP (!) 144/86   Pulse 89   Temp 98 F (36.7 C) (Oral)   Resp 16   Wt 130 lb (59 kg)   SpO2 97%   BMI 20.51 kg/m   BP Readings from Last 3 Encounters:  09/04/15 (!) 144/86  08/28/15 (!) 142/76  05/27/15 128/67    Wt Readings from Last 3 Encounters:  09/04/15 130 lb (59 kg)  08/28/15 130 lb 6.4 oz (59.1 kg)  05/27/15 124 lb 1.9 oz (56.3 kg)    Physical Exam  Constitutional: She is oriented to person, place, and time. No distress.  HENT:  Mouth/Throat: Oropharynx is clear and moist. No oropharyngeal exudate.  Eyes: Conjunctivae are normal. Right eye exhibits no discharge. Left eye exhibits no discharge. No scleral icterus.  Neck: Normal range of motion. Neck supple. No JVD present. No tracheal deviation present. No thyromegaly present.  Cardiovascular: Normal rate, regular rhythm, S1 normal, S2 normal and intact distal pulses.  Exam reveals no gallop and no friction rub.   Murmur heard.  Decrescendo systolic murmur is present with a grade of 1/6   No diastolic murmur is present  Pulmonary/Chest: Effort normal and breath sounds normal. No stridor. No respiratory distress. She has no wheezes. She has no rales. She exhibits no tenderness.  Abdominal: Soft. Bowel sounds are normal. She exhibits no distension. There is no tenderness. There is no rebound and no guarding.  Musculoskeletal: Normal range of motion. She exhibits no edema or tenderness.  Lymphadenopathy:    She has no cervical adenopathy.  Neurological: She is oriented to person, place, and time.  Skin: Skin is warm and dry. No rash noted. She is not diaphoretic. No erythema. No pallor.  Vitals reviewed.   Lab Results  Component Value Date   WBC 4.8 04/22/2015   HGB 12.5 04/22/2015   HCT 38.2 04/22/2015   PLT 201.0 04/22/2015   GLUCOSE 92 04/22/2015    CHOL 155 02/11/2014   TRIG 84.0 02/11/2014   HDL 46.10 02/11/2014   LDLCALC 92 02/11/2014   ALT 14 04/22/2015   AST 22 04/22/2015   NA 142 04/22/2015   K 4.3 04/22/2015   CL 105 04/22/2015   CREATININE 0.98 04/22/2015   BUN 23 04/22/2015   CO2 27 04/22/2015   TSH 1.44 04/22/2015   INR 1.7 09/04/2015   HGBA1C 5.9 06/09/2012    No results found.  Assessment & Plan:   Alyssa [name] was seen today for hypertension.  Diagnoses and all orders for this visit:  Essential hypertension, benign- we'll start an ARB for blood pressure control, she will continue furosemide at the current dose. -     telmisartan (MICARDIS) 20 MG tablet; Take 1 tablet (20 mg total) by mouth daily.   I am having Alyssa [name] start on telmisartan. I am also having her maintain her acetaminophen, loperamide, levothyroxine, warfarin, potassium chloride, and furosemide.  Meds ordered this encounter  Medications  . telmisartan (MICARDIS) 20 MG tablet    Sig: Take 1 tablet (20 mg total) by mouth daily.    Dispense:  90 tablet    Refill:  3     Follow-up: Return in about 3 months (around 12/05/2015).  [name], MD

## 2015-09-04 NOTE — Progress Notes (Signed)
Pre visit review using our clinic review tool, if applicable. No additional management support is needed unless otherwise documented below in the visit note. 

## 2015-09-18 ENCOUNTER — Ambulatory Visit (INDEPENDENT_AMBULATORY_CARE_PROVIDER_SITE_OTHER): Payer: Medicare Other | Admitting: *Deleted

## 2015-09-18 DIAGNOSIS — Z5181 Encounter for therapeutic drug level monitoring: Secondary | ICD-10-CM | POA: Diagnosis not present

## 2015-09-18 DIAGNOSIS — I4891 Unspecified atrial fibrillation: Secondary | ICD-10-CM | POA: Diagnosis not present

## 2015-09-18 LAB — POCT INR: INR: 2.4

## 2015-10-02 ENCOUNTER — Ambulatory Visit (INDEPENDENT_AMBULATORY_CARE_PROVIDER_SITE_OTHER): Payer: Medicare Other

## 2015-10-02 DIAGNOSIS — I4891 Unspecified atrial fibrillation: Secondary | ICD-10-CM | POA: Diagnosis not present

## 2015-10-02 DIAGNOSIS — Z5181 Encounter for therapeutic drug level monitoring: Secondary | ICD-10-CM | POA: Diagnosis not present

## 2015-10-02 LAB — POCT INR: INR: 2.4

## 2015-10-23 ENCOUNTER — Ambulatory Visit (INDEPENDENT_AMBULATORY_CARE_PROVIDER_SITE_OTHER): Payer: Medicare Other | Admitting: Pharmacist

## 2015-10-23 DIAGNOSIS — I4891 Unspecified atrial fibrillation: Secondary | ICD-10-CM

## 2015-10-23 DIAGNOSIS — Z5181 Encounter for therapeutic drug level monitoring: Secondary | ICD-10-CM

## 2015-10-23 LAB — POCT INR: INR: 2.3

## 2015-10-24 DIAGNOSIS — D2272 Melanocytic nevi of left lower limb, including hip: Secondary | ICD-10-CM | POA: Diagnosis not present

## 2015-10-24 DIAGNOSIS — Z85828 Personal history of other malignant neoplasm of skin: Secondary | ICD-10-CM | POA: Diagnosis not present

## 2015-10-24 DIAGNOSIS — D2271 Melanocytic nevi of right lower limb, including hip: Secondary | ICD-10-CM | POA: Diagnosis not present

## 2015-10-24 DIAGNOSIS — L820 Inflamed seborrheic keratosis: Secondary | ICD-10-CM | POA: Diagnosis not present

## 2015-10-24 DIAGNOSIS — Z85820 Personal history of malignant melanoma of skin: Secondary | ICD-10-CM | POA: Diagnosis not present

## 2015-10-24 DIAGNOSIS — L821 Other seborrheic keratosis: Secondary | ICD-10-CM | POA: Diagnosis not present

## 2015-10-24 DIAGNOSIS — L853 Xerosis cutis: Secondary | ICD-10-CM | POA: Diagnosis not present

## 2015-10-24 DIAGNOSIS — L814 Other melanin hyperpigmentation: Secondary | ICD-10-CM | POA: Diagnosis not present

## 2015-10-24 DIAGNOSIS — L570 Actinic keratosis: Secondary | ICD-10-CM | POA: Diagnosis not present

## 2015-11-05 ENCOUNTER — Ambulatory Visit (INDEPENDENT_AMBULATORY_CARE_PROVIDER_SITE_OTHER): Payer: Medicare Other

## 2015-11-05 DIAGNOSIS — Z23 Encounter for immunization: Secondary | ICD-10-CM | POA: Diagnosis not present

## 2015-11-26 ENCOUNTER — Other Ambulatory Visit: Payer: Self-pay | Admitting: Internal Medicine

## 2015-12-02 ENCOUNTER — Telehealth: Payer: Self-pay | Admitting: Internal Medicine

## 2015-12-02 NOTE — Telephone Encounter (Signed)
Spoke to patient regarding her recent ShOB and fatigue. Patient states that she has noticed an increase in sx's x 2 days. I informed patient that her remote was received and she is in chronic AF. Her presenting rhythm is AF/Vp.   Patient denies any LE edema or abdominal bloating. Patient did admit to having orthopnea and has to lie with her head elevated at times. I asked patient if she has been avoiding salty foods. Patient states that she been eating out more since her son has been in town. I encouraged her to reduce her salt intake and see if it helps. Patient verbalized understanding and appreciation of information.  Patient to follow up with RU on 10/30 as scheduled.

## 2015-12-02 NOTE — Telephone Encounter (Signed)
Pt calling to speak with someone in EP/Device about sending a remote transmission in for them to review.  Pt states she has a history of a-fib, which is usually well controlled, but over the course of 2 days she has felt more fatigued and SOB.  Pt wanted to send a transmission into our office, for someone in  Device to review and see if her afib has worsened or not.  Advised the pt to go ahead and send this transmission through, and I will endorse this to our device clinic to review and follow-up with the pt thereafter.  Pt verbalized understanding and agrees with this plan.  Device clinic is aware of transmission being sent, and they will follow-up with the pt accordingly.

## 2015-12-02 NOTE — Telephone Encounter (Signed)
Pt is having shortness of breathe now would like to speak with a nurse

## 2015-12-03 ENCOUNTER — Encounter (HOSPITAL_COMMUNITY): Payer: Self-pay | Admitting: Emergency Medicine

## 2015-12-03 ENCOUNTER — Emergency Department (HOSPITAL_COMMUNITY)
Admission: EM | Admit: 2015-12-03 | Discharge: 2015-12-04 | Disposition: A | Discharge status: Home / Self Care | Payer: Medicare Other | Attending: Emergency Medicine | Admitting: Emergency Medicine

## 2015-12-03 ENCOUNTER — Emergency Department (HOSPITAL_COMMUNITY): Payer: Medicare Other

## 2015-12-03 DIAGNOSIS — E039 Hypothyroidism, unspecified: Secondary | ICD-10-CM | POA: Diagnosis not present

## 2015-12-03 DIAGNOSIS — Z7901 Long term (current) use of anticoagulants: Secondary | ICD-10-CM | POA: Diagnosis not present

## 2015-12-03 DIAGNOSIS — R42 Dizziness and giddiness: Secondary | ICD-10-CM | POA: Diagnosis not present

## 2015-12-03 DIAGNOSIS — R0602 Shortness of breath: Secondary | ICD-10-CM | POA: Insufficient documentation

## 2015-12-03 DIAGNOSIS — Z85850 Personal history of malignant neoplasm of thyroid: Secondary | ICD-10-CM | POA: Diagnosis not present

## 2015-12-03 DIAGNOSIS — F17210 Nicotine dependence, cigarettes, uncomplicated: Secondary | ICD-10-CM | POA: Insufficient documentation

## 2015-12-03 DIAGNOSIS — Z79899 Other long term (current) drug therapy: Secondary | ICD-10-CM | POA: Diagnosis not present

## 2015-12-03 LAB — BASIC METABOLIC PANEL
Anion gap: 8 (ref 5–15)
BUN: 27 mg/dL — AB (ref 6–20)
CHLORIDE: 101 mmol/L (ref 101–111)
CO2: 24 mmol/L (ref 22–32)
Calcium: 8.6 mg/dL — ABNORMAL LOW (ref 8.9–10.3)
Creatinine, Ser: 1.07 mg/dL — ABNORMAL HIGH (ref 0.44–1.00)
GFR calc Af Amer: 52 mL/min — ABNORMAL LOW (ref >60)
GFR calc non Af Amer: 45 mL/min — ABNORMAL LOW (ref >60)
GLUCOSE: 91 mg/dL (ref 65–99)
POTASSIUM: 4 mmol/L (ref 3.5–5.1)
Sodium: 133 mmol/L — ABNORMAL LOW (ref 135–145)

## 2015-12-03 LAB — CBC WITH DIFFERENTIAL/PLATELET
Basophils Absolute: 0 10*3/uL (ref 0.0–0.1)
Basophils Relative: 1 %
EOS PCT: 2 %
Eosinophils Absolute: 0.1 10*3/uL (ref 0.0–0.7)
HCT: 36.3 % (ref 36.0–46.0)
Hemoglobin: 12.1 g/dL (ref 12.0–15.0)
LYMPHS ABS: 1.6 10*3/uL (ref 0.7–4.0)
LYMPHS PCT: 34 %
MCH: 30.6 pg (ref 26.0–34.0)
MCHC: 33.3 g/dL (ref 30.0–36.0)
MCV: 91.7 fL (ref 78.0–100.0)
MONO ABS: 0.6 10*3/uL (ref 0.1–1.0)
MONOS PCT: 12 %
Neutro Abs: 2.3 10*3/uL (ref 1.7–7.7)
Neutrophils Relative %: 51 %
PLATELETS: 191 10*3/uL (ref 150–400)
RBC: 3.96 MIL/uL (ref 3.87–5.11)
RDW: 12.3 % (ref 11.5–15.5)
WBC: 4.6 10*3/uL (ref 4.0–10.5)

## 2015-12-03 LAB — I-STAT TROPONIN, ED: Troponin i, poc: 0.01 ng/mL (ref 0.00–0.08)

## 2015-12-03 LAB — PROTIME-INR
INR: 1.61
Prothrombin Time: 19.3 seconds — ABNORMAL HIGH (ref 11.4–15.2)

## 2015-12-03 NOTE — ED Triage Notes (Signed)
Pt reports having episode today of dizziness when standing along with shortness of breath that had began over the last few days. Pt reports prior cardiac hx of a-fib and pacemaker. Pt currently alert and oriented x 4 no acute distress. Pt was ambulatory from triage.

## 2015-12-04 ENCOUNTER — Ambulatory Visit (INDEPENDENT_AMBULATORY_CARE_PROVIDER_SITE_OTHER): Payer: Medicare Other | Admitting: Cardiovascular Disease

## 2015-12-04 DIAGNOSIS — I482 Chronic atrial fibrillation, unspecified: Secondary | ICD-10-CM

## 2015-12-04 DIAGNOSIS — Z5181 Encounter for therapeutic drug level monitoring: Secondary | ICD-10-CM

## 2015-12-04 LAB — D-DIMER, QUANTITATIVE (NOT AT ARMC)

## 2015-12-04 NOTE — ED Provider Notes (Signed)
[facility] DEPT Provider Note   CSN: [id number] Arrival date & time: 12/03/15  1909     History   Chief Complaint Chief Complaint  Patient presents with  . Shortness of Breath  . Dizziness    HPI RHIYAN UMLAND is a 82 y.o. female.  82yo F w/ PMH including A fib on coumadin, pacemaker, h/o breast cancer and thyroid cancer who p/w dizziness and SOB. Earlier today, pt had been sitting for about 45 minutes and stood up and walked forward. She began feeling lightheaded like she was going to pass out but she did not. She had a similar episode previously, approximately 3 months ago. Patient also reports a few days of mild shortness of breath, feeling like she can't quite take a deep breath, which she states feels like episodes of atrial fibrillation. She denies any heart palpitations. She contacted her cardiologist yesterday and has a follow-up appointment scheduled on 10/30. She denies any associated chest pain. She has otherwise been in her usual state of health with no fever, cough, vomiting, or abdominal pain. She has chronic diarrhea which is unchanged. She is compliant with her medications. Currently she denies any symptoms.   The history is provided by the patient.  Shortness of Breath   Dizziness  Associated symptoms: shortness of breath     Past Medical History:  Diagnosis Date  . Atrial fibrillation ([place])    pacemaker, chronic anticoag  . Diverticulosis   . GERD (gastroesophageal reflux disease)   . Hx of breast cancer   . Hyperkalemia   . Hypothyroidism   . IBS (irritable bowel syndrome)   . Internal hemorrhoids   . Left inguinal hernia    direct  . MVP (mitral valve prolapse)   . Osteoporosis   . Renal insufficiency   . Thyroid cancer [facility])     Patient Active Problem List   Diagnosis Date Noted  . Essential hypertension, benign 09/04/2015  . Severe tricuspid regurgitation 04/22/2015  . Complete heart block ([place]) 02/26/2015  . Routine general medical  examination at a health care facility 02/11/2014  . Encounter for therapeutic drug monitoring 07/23/2013  .  atrial fibrillation 10/11/2012  . Chronic renal insufficiency, stage III (moderate) 06/12/2012  . Pacemaker-dual chamber  06/23/2010  . Allergic rhinitis 05/20/2010  . Irritable bowel syndrome 12/01/2009  . GERD 06/10/2009  . Hypothyroidism 02/26/2008  . OSTEOPOROSIS 02/26/2008    Past Surgical History:  Procedure Laterality Date  . COLONOSCOPY  10/28/2005   internal hemorrhoids, diverticulosis (same as in 2002 and random bxs negative then)  . EP IMPLANTABLE DEVICE N/A 02/26/2015   Procedure: PPM Generator Changeout;  Surgeon: [name], MD;  Location: [facility];  Service: Cardiovascular;  Laterality: N/A;  . MASTECTOMY  1982   Bilateral  . PACEMAKER INSERTION    . THYROIDECTOMY    . TONSILLECTOMY    . UPPER GASTROINTESTINAL ENDOSCOPY  09/06/2000   normal    OB History    No data available       Home Medications    Prior to Admission medications   Medication Sig Start Date End Date Taking? Authorizing Provider  acetaminophen (TYLENOL ARTHRITIS PAIN) 650 MG CR tablet Take 1,300 mg by mouth 2 (two) times daily.     Yes Historical Provider, MD  furosemide (LASIX) 40 MG tablet Take one tablet (40 mg) by mouth once daily 09/04/15  Yes [name], MD  Lactobacillus Rhamnosus, GG, (CULTURELLE PO) Take 1 capsule by mouth  daily.   Yes Historical Provider, MD  levothyroxine (SYNTHROID, LEVOTHROID) 88 MCG tablet TAKE ONE TABLET BY MOUTH ONCE DAILY 06/17/15  Yes [name], MD  loperamide (IMODIUM A-D) 2 MG tablet Take 2 mg by mouth 3 (three) times daily as needed for diarrhea or loose stools.    Yes Historical Provider, MD  potassium chloride (K-DUR) 10 MEQ tablet Take 1 tablet (10 mEq total) by mouth daily. 08/20/15  Yes [name], MD  telmisartan (MICARDIS) 20 MG tablet Take 1 tablet (20 mg total) by mouth daily. 09/04/15  Yes [name], MD  warfarin  (COUMADIN) 3 MG tablet USE AS DIRECTED BY COUMADIN CLINIC Patient taking differently: USE AS DIRECTED.Marland Kitchen   TAKE 3 MG EVERY DAY BUT TAKE 1/2 TABLET (1.5 MG) ON  THURSDAY 11/26/15  Yes [name], MD    Family History Family History  Problem Relation Age of Onset  . Colon cancer Father 30  . Heart disease Father   . Stroke Mother   . Arthritis Other   . Hypertension Other   . Esophageal cancer Neg Hx   . Pancreatic cancer Neg Hx   . Kidney disease Neg Hx   . Liver disease Neg Hx   . Diabetes Neg Hx     Social History Social History  Substance Use Topics  . Smoking status: Former Smoker    Quit date: 02/08/1954  . Smokeless tobacco: Never Used  . Alcohol use No     Allergies   Review of patient's allergies indicates no known allergies.   Review of Systems Review of Systems  Respiratory: Positive for shortness of breath.   Neurological: Positive for dizziness.   10 Systems reviewed and are negative for acute change except as noted in the HPI.   Physical Exam Updated Vital Signs BP 152/78   Pulse (!) 59   Temp 98.3 F (36.8 C) (Oral)   Resp 18   Ht 5\' 6"  (1.676 m)   Wt 130 lb (59 kg)   SpO2 97%   BMI 20.98 kg/m   Physical Exam  Constitutional: She is oriented to person, place, and time. She appears well-developed and well-nourished. No distress.  HENT:  Head: Normocephalic and atraumatic.  Moist mucous membranes  Eyes: Conjunctivae are normal. Pupils are equal, round, and reactive to light.  Neck: Neck supple.  Cardiovascular: Normal rate, regular rhythm and normal heart sounds.   No murmur heard. Pulmonary/Chest: Effort normal and breath sounds normal.  Abdominal: Soft. Bowel sounds are normal. She exhibits no distension. There is no tenderness.  Musculoskeletal: She exhibits no edema.  Neurological: She is alert and oriented to person, place, and time.  Fluent speech Normal gait  Skin: Skin is warm and dry.  Psychiatric: She has a normal mood and  affect. Judgment normal.  pleasant  Nursing note and vitals reviewed.    ED Treatments / Results  Labs (all labs ordered are listed, but only abnormal results are displayed) Labs Reviewed  BASIC METABOLIC PANEL - Abnormal; Notable for the following:       Result Value   Sodium 133 (*)    BUN 27 (*)    Creatinine, Ser 1.07 (*)    Calcium 8.6 (*)    GFR calc non Af Amer 45 (*)    GFR calc Af Amer 52 (*)    All other components within normal limits  PROTIME-INR - Abnormal; Notable for the following:    Prothrombin Time 19.3 (*)  All other components within normal limits  CBC WITH DIFFERENTIAL/PLATELET  D-DIMER, QUANTITATIVE (NOT AT [facility])  BRAIN NATRIURETIC PEPTIDE  I-STAT TROPOININ, ED    EKG  EKG Interpretation  Date/Time:  Wednesday December 03 2015 19:47:27 EDT Ventricular Rate:  60 PR Interval:    QRS Duration: 156 QT Interval:  486 QTC Calculation: 486 R Axis:   -78 Text Interpretation:  Ventricular-paced rhythm No further analysis attempted due to paced rhythm T waves in inferiolateral leads upright compared to previous, V paced rhythm new from previous Confirmed by LITTLE MD, RACHEL [phone]) on 12/03/2015 8:25:18 PM       Radiology Dg Chest 2 View  Result Date: 12/03/2015 CLINICAL DATA:  Shortness of breath for 2 days with dizziness EXAM: CHEST  2 VIEW COMPARISON:  None. FINDINGS: Postsurgical changes in the right axilla are noted. Cardiac shadow is enlarged. A pacing device is noted in satisfactory position. Lungs are hyperinflated consistent with COPD. No focal infiltrate or sizable effusion is noted. IMPRESSION: COPD without acute abnormality. Electronically Signed   By: Inez Catalina M.D.   On: 12/03/2015 21:26    Procedures Procedures (including critical care time)  Medications Ordered in ED Medications - No data to display   Initial Impression / Assessment and Plan / ED Course  I have reviewed the triage vital signs and the nursing  notes.  Pertinent labs & imaging results that were available during my care of the patient were reviewed by me and considered in my medical decision making (see chart for details).  Clinical Course   PT w/ A fib on coumadin and w/ pacemaker p/w episode of lightheadedness after standing as well as several days of mild SOB. She was well-appearing and pleasant on exam with reassuring vital signs, O2 100% on room air. Normal work of breathing. She had no complaints on my exam. Orthostatic vital signs normal. EKG shows paced rhythm, no acute findings. Obtained above labs which shows reassuring CBC and BMP, neg trop and d-dimer. No complaints of chest pain and chest x-ray negative acute. I reviewed notes from yesterday from Dr. Caryl Comes and obtained another pacemaker interrogation which showed no abnormalities. Given that the patient's episode was related to standing up and she did not have any syncopal events, as well as the fact that she currently feels well, I feel she is safe for discharge. She has follow-up appointment within a few days with Dr. Caryl Comes. I have reviewed supportive care and extensively reviewed return precautions. Patient voiced understanding and was discharged in satisfactory condition.  Final Clinical Impressions(s) / ED Diagnoses   Final diagnoses:  Shortness of breath  Lightheadedness    New Prescriptions New Prescriptions   No medications on file     Sharlett Iles, MD 12/04/15 [phone]

## 2015-12-04 NOTE — ED Notes (Signed)
Pacemaker interrogated  Waiting on report

## 2015-12-05 ENCOUNTER — Telehealth: Payer: Self-pay | Admitting: Emergency Medicine

## 2015-12-05 NOTE — Telephone Encounter (Signed)
Pt called and has questions about her BP medicine. She is asking that you give her a call back about this. Please advise thanks.

## 2015-12-05 NOTE — Telephone Encounter (Signed)
Contacted pt and offered a visit. Pt did have a recent visit. I advised to check BP at home, move slowly to not increase the chances of orthostatic hypotension. Pt agreed and stated that she does have an appt with Dr. Jens Som. Also recommended pt to keep a BP diary.

## 2015-12-07 NOTE — Progress Notes (Signed)
Cardiology Office Note Date:  12/08/2015  Patient ID:  Alyssa, [name] 03-Jun-1924, MRN [id number] PCP:  [name], MD  Electrophysiologist: Dr. Caryl Comes   Chief Complaint: planned 3 month f/u  History of Present Illness: Alyssa [name] is a 82 y.o. female with history of CHB w/PPM, permanent AFib, hypothyroidism, IBS, GERD, LE edema/DOE that waxes and wanes, at her last visit with Dr. Caryl Comes, he mentioned historically a syncopal episode after prolonged standing and in the heat. Because of that somebody thought she might have been dehydrated and recommended that her diuretics be reduced. This was followed by increasing symptoms of shortness of breath and nocturnal dyspnea. She called and we suggested she increase her furosemide back to daily from every other day. With this her symptoms have largely resolved. She has some ongoing problems with dyspnea particularly at the end of the day and recommended at that visit she take her lasix BID alternating days with daily only.  She was in the ER 12/04/15 with an episode of dizziness/near syncope that occurred upon standing and SOB, orthostatic vitals were reported as negative, CXR negative, BMET/CBC stable, and pacer check with normal function and discharged from the ER to keep her visit in the office today.  She comes in today to be seen for Dr. Caryl Comes, reports feeling some recurrent symptoms where she feels a little lightheaded or "hazy", she states she feels lightheaded, feels like her vision changes some and then it passes.  She states her son has been visiting and in the last 3 weeks eating out, not her usual diet, busy and out more then usual though at baseline states sge is an active lady.  No CP or palpitations with/without these events.  She does have trouble intermittently with her IBS, and the day prior to the ER and last night had bouts of diarrhea.  She tells me that she is taking her lasix once daily and her K+ only when she feels muscle  aches/cramps, her K+ was OK last week. She reports these "hazy spells" occur only when up and around, upon standing, pass when she sits.    She discussed her INR 1.67 and her warfarin was up-titrated for a recheck  Next week. She denies any bleeding or signs of bleeding, her H/H stable last week as well.  She states her SOB is at about it's baseline as noted by previous visits.   DEVICE information: MDT dual chamber PPM, implanted 10/30/1998 with gen change 02/26/15, Dr. Caryl Comes, [place]   Past Medical History:  Diagnosis Date  . Atrial fibrillation ([place])    pacemaker, chronic anticoag  . Diverticulosis   . GERD (gastroesophageal reflux disease)   . Hx of breast cancer   . Hyperkalemia   . Hypothyroidism   . IBS (irritable bowel syndrome)   . Internal hemorrhoids   . Left inguinal hernia    direct  . MVP (mitral valve prolapse)   . Osteoporosis   . Renal insufficiency   . Thyroid cancer [facility])     Past Surgical History:  Procedure Laterality Date  . COLONOSCOPY  10/28/2005   internal hemorrhoids, diverticulosis (same as in 2002 and random bxs negative then)  . EP IMPLANTABLE DEVICE N/A 02/26/2015   Procedure: PPM Generator Changeout;  Surgeon: [name], MD;  Location: [facility];  Service: Cardiovascular;  Laterality: N/A;  . MASTECTOMY  1982   Bilateral  . PACEMAKER INSERTION    . THYROIDECTOMY    . TONSILLECTOMY    .  UPPER GASTROINTESTINAL ENDOSCOPY  09/06/2000   normal    Current Outpatient Prescriptions  Medication Sig Dispense Refill  . acetaminophen (TYLENOL ARTHRITIS PAIN) 650 MG CR tablet Take 1,300 mg by mouth 2 (two) times daily.      . furosemide (LASIX) 40 MG tablet Take one tablet (40 mg) by mouth once daily 60 tablet 6  . Lactobacillus Rhamnosus, GG, (CULTURELLE PO) Take 1 capsule by mouth daily.    Marland Kitchen levothyroxine (SYNTHROID, LEVOTHROID) 88 MCG tablet TAKE ONE TABLET BY MOUTH ONCE DAILY 90 tablet 1  . loperamide (IMODIUM A-D) 2 MG tablet Take 2 mg by  mouth 3 (three) times daily as needed for diarrhea or loose stools.     . potassium chloride (K-DUR) 10 MEQ tablet Take 1 tablet (10 mEq total) by mouth daily.    Marland Kitchen telmisartan (MICARDIS) 20 MG tablet Take 1 tablet (20 mg total) by mouth daily. 90 tablet 3  . warfarin (COUMADIN) 3 MG tablet USE AS DIRECTED BY COUMADIN CLINIC (Patient taking differently: USE AS DIRECTED.Marland Kitchen   TAKE 3 MG EVERY DAY BUT TAKE 1/2 TABLET (1.5 MG) ON  THURSDAY) 30 tablet 3   No current facility-administered medications for this visit.     Allergies:   Review of patient's allergies indicates no known allergies.   Social History:  The patient  reports that she quit smoking about 61 years ago. She has never used smokeless tobacco. She reports that she does not drink alcohol or use drugs.   Family History:  The patient's family history includes Arthritis in her other; Colon cancer (age of onset: 67) in her father; Heart disease in her father; Hypertension in her other; Stroke in her mother.  ROS:  Please see the history of present illness.  All other systems are reviewed and otherwise negative.   PHYSICAL EXAM: VS:  BP (!) 146/68   Pulse 77   Ht 5\' 6"  (1.676 m)   Wt 130 lb (59 kg)   BMI 20.98 kg/m  BMI: Body mass index is 20.98 kg/m. Well nourished, well developed, in no acute distress  HEENT: normocephalic, atraumatic  Neck: no JVD, carotid bruits or masses Cardiac:  RRR; no significant murmurs, no rubs, or gallops Lungs:  clear to auscultation bilaterally, no wheezing, rhonchi or rales  Abd: soft, nontender MS: no deformity, age appropriate atrophy Ext: no edema  Skin: warm and dry, no rash Neuro:  No gross deficits appreciated Psych: euthymic mood, full affect  PPM site is stable, no tethering or discomfort   EKG:  Done 12/03/15 is V paced PPM interrogation today and reviewed by myself: stable battery and lead status/measurements, sensor threshold was changed from medium/low to low to increase response  to early activity gioven failrly flat HR histogram anad patient report of a pretty active lifestyle.  05/01/15: TTE Study Conclusions - Left ventricle: The cavity size was normal. Wall thickness was   normal. Systolic function was normal. The estimated ejection   fraction was in the range of 55% to 60%. Wall motion was normal;   there were no regional wall motion abnormalities. The study is   not technically sufficient to allow evaluation of LV diastolic   function. - Mitral valve: Calcified annulus. - Left atrium: The atrium was severely dilated. 2mm - Right ventricle: The cavity size was mildly dilated. - Right atrium: The atrium was severely dilated. - Tricuspid valve: There was severe regurgitation. - Pulmonary arteries: Systolic pressure was moderately increased.   PA peak pressure: 52  mm Hg (S). - Pericardium, extracardiac: A small pericardial effusion was   identified. Impressions: - Normal LV function; severe biatrial enlargement; mild RVE; severe   TR with moderately elevated pulmonary pressure; small pericardial   effusion.  Recent Labs: 04/22/2015: ALT 14; Pro B Natriuretic peptide (BNP) 366.0; TSH 1.44 12/03/2015: BUN 27; Creatinine, Ser 1.07; Hemoglobin 12.1; Platelets 191; Potassium 4.0; Sodium 133  No results found for requested labs within last 8760 hours.   Estimated Creatinine Clearance: 33.2 mL/min (by C-G formula based on SCr of 1.07 mg/dL (H)).   Wt Readings from Last 3 Encounters:  12/08/15 130 lb (59 kg)  12/03/15 130 lb (59 kg)  09/04/15 130 lb (59 kg)     Other studies reviewed: Additional studies/records reviewed today include: summarized above  ASSESSMENT AND PLAN:  1. CHB/PPM     normal device function, programming changes as noted above  2. Permanent Afib     CHA2DS2Vasc is at least 3 on warfarin, monitored and managed with the coumadin clinic  3. HFpEF     euvolemic by exam and weight  4. Dizziness, near syncope     Orthostatics today  are unremarkable without significant change though reprted the same "hazy " feeling     Worry about her being a little dry.   She is instructed to take 1/2 lasix daily and if she has any increased SOB to resume the whole tab.  On days that she had had diarrhea with her IBS, not to take any lasix.  Monitor her symptoms.   If no improvement or if any worsening to call and let me know.   Disposition: F/u with me in 2-3 months, sooner if needed.  Current medicines are reviewed at length with the patient today.  The patient did not have any concerns regarding medicines.  Haywood Lasso, PA-C 12/08/2015 2:46 PM     [place] [address] [phone] (office)  [phone] (fax)

## 2015-12-08 ENCOUNTER — Encounter: Payer: Self-pay | Admitting: Physician Assistant

## 2015-12-08 ENCOUNTER — Ambulatory Visit (INDEPENDENT_AMBULATORY_CARE_PROVIDER_SITE_OTHER): Payer: Medicare Other | Admitting: Physician Assistant

## 2015-12-08 VITALS — BP 146/68 | HR 77 | Ht 66.0 in | Wt 130.0 lb

## 2015-12-08 DIAGNOSIS — R42 Dizziness and giddiness: Secondary | ICD-10-CM | POA: Diagnosis not present

## 2015-12-08 DIAGNOSIS — I442 Atrioventricular block, complete: Secondary | ICD-10-CM | POA: Diagnosis not present

## 2015-12-08 DIAGNOSIS — I482 Chronic atrial fibrillation: Secondary | ICD-10-CM

## 2015-12-08 DIAGNOSIS — I509 Heart failure, unspecified: Secondary | ICD-10-CM | POA: Diagnosis not present

## 2015-12-08 DIAGNOSIS — I4821 Permanent atrial fibrillation: Secondary | ICD-10-CM

## 2015-12-08 NOTE — Patient Instructions (Addendum)
Medication Instructions:   START TAKING LASIX  20 MG ( HALF OF 40 MG TABLET ) DAILY  1. TAKE NO LASIX ON DAYS OF DIARRHEA   2. IF YOU HAVE SHORTNESS OF BREATH  YOU MAY TAKE WHOLE TABLET  LASIX 40 MG     If you need a refill on your cardiac medications before your next appointment, please call your pharmacy.  Labwork: NONE ORDERED  TODAY      Testing/Procedures:  NONE ORDERED  TODAY     Follow-Up: IN 2 TO 3 MONTHS WITH RENEE URSUY     Any Other Special Instructions Will Be Listed Below (If Applicable).

## 2015-12-15 ENCOUNTER — Ambulatory Visit (INDEPENDENT_AMBULATORY_CARE_PROVIDER_SITE_OTHER): Payer: Medicare Other | Admitting: Internal Medicine

## 2015-12-15 ENCOUNTER — Telehealth: Payer: Self-pay

## 2015-12-15 ENCOUNTER — Other Ambulatory Visit (INDEPENDENT_AMBULATORY_CARE_PROVIDER_SITE_OTHER): Payer: Medicare Other

## 2015-12-15 ENCOUNTER — Encounter: Payer: Self-pay | Admitting: Internal Medicine

## 2015-12-15 ENCOUNTER — Ambulatory Visit (INDEPENDENT_AMBULATORY_CARE_PROVIDER_SITE_OTHER)
Admission: RE | Admit: 2015-12-15 | Discharge: 2015-12-15 | Disposition: A | Discharge status: Home / Self Care | Payer: Medicare Other | Source: Ambulatory Visit | Attending: Internal Medicine | Admitting: Internal Medicine

## 2015-12-15 VITALS — BP 146/78 | HR 75 | Temp 97.9°F | Resp 16 | Wt 124.0 lb

## 2015-12-15 DIAGNOSIS — R0789 Other chest pain: Secondary | ICD-10-CM

## 2015-12-15 DIAGNOSIS — E038 Other specified hypothyroidism: Secondary | ICD-10-CM | POA: Diagnosis not present

## 2015-12-15 DIAGNOSIS — R072 Precordial pain: Secondary | ICD-10-CM | POA: Diagnosis not present

## 2015-12-15 DIAGNOSIS — I10 Essential (primary) hypertension: Secondary | ICD-10-CM

## 2015-12-15 DIAGNOSIS — R0602 Shortness of breath: Secondary | ICD-10-CM | POA: Diagnosis not present

## 2015-12-15 LAB — BASIC METABOLIC PANEL
BUN: 22 mg/dL (ref 6–23)
CALCIUM: 9.2 mg/dL (ref 8.4–10.5)
CO2: 28 meq/L (ref 19–32)
CREATININE: 1.08 mg/dL (ref 0.40–1.20)
Chloride: 102 mEq/L (ref 96–112)
GFR: 50.7 mL/min — ABNORMAL LOW (ref >60.00)
GLUCOSE: 87 mg/dL (ref 70–99)
Potassium: 5.2 mEq/L — ABNORMAL HIGH (ref 3.5–5.1)
SODIUM: 137 meq/L (ref 135–145)

## 2015-12-15 LAB — TSH: TSH: 0.85 u[IU]/mL (ref 0.35–4.50)

## 2015-12-15 MED ORDER — LEVOTHYROXINE SODIUM 88 MCG PO TABS: 88.0000 ug | ORAL_TABLET | Freq: Every day | ORAL | 1 refills | Status: DC

## 2015-12-15 NOTE — Progress Notes (Signed)
Pre visit review using our clinic review tool, if applicable. No additional management support is needed unless otherwise documented below in the visit note. 

## 2015-12-15 NOTE — Patient Instructions (Signed)

## 2015-12-15 NOTE — Progress Notes (Signed)
Subjective:  Patient ID: Alyssa [name], female    DOB: Apr 22, 1924  Age: 82 y.o. MRN: [id number]  CC: Hypothyroidism   HPI Alyssa [name] presents for follow-up on hypothyroidism but she also complains of about a 3 week history of painful area just over her left lower breastbone. She denies any trauma or injury. The pain is elicited by palpation and movement.  Outpatient Medications Prior to Visit  Medication Sig Dispense Refill  . acetaminophen (TYLENOL ARTHRITIS PAIN) 650 MG CR tablet Take 1,300 mg by mouth 2 (two) times daily.      . Lactobacillus Rhamnosus, GG, (CULTURELLE PO) Take 1 capsule by mouth daily.    Marland Kitchen loperamide (IMODIUM A-D) 2 MG tablet Take 2 mg by mouth 3 (three) times daily as needed for diarrhea or loose stools.     . potassium chloride (K-DUR) 10 MEQ tablet Take 1 tablet (10 mEq total) by mouth daily.    Marland Kitchen telmisartan (MICARDIS) 20 MG tablet Take 1 tablet (20 mg total) by mouth daily. 90 tablet 3  . warfarin (COUMADIN) 3 MG tablet USE AS DIRECTED BY COUMADIN CLINIC (Patient taking differently: USE AS DIRECTED.Marland Kitchen   TAKE 3 MG EVERY DAY BUT TAKE 1/2 TABLET (1.5 MG) ON  THURSDAY) 30 tablet 3  . levothyroxine (SYNTHROID, LEVOTHROID) 88 MCG tablet TAKE ONE TABLET BY MOUTH ONCE DAILY 90 tablet 1  . furosemide (LASIX) 40 MG tablet Take one tablet (40 mg) by mouth once daily (Patient not taking: Reported on 12/15/2015) 60 tablet 6   No facility-administered medications prior to visit.     ROS Review of Systems  Constitutional: Negative for appetite change, chills, diaphoresis, fatigue and fever.  HENT: Negative.  Negative for trouble swallowing.   Eyes: Negative.  Negative for visual disturbance.  Respiratory: Negative for cough, choking, chest tightness, shortness of breath and stridor.   Cardiovascular: Positive for chest pain. Negative for palpitations and leg swelling.  Gastrointestinal: Negative for abdominal pain, blood in stool, constipation, diarrhea, nausea and  vomiting.  Endocrine: Negative.  Negative for cold intolerance and heat intolerance.  Genitourinary: Negative.  Negative for difficulty urinating.  Musculoskeletal: Negative.   Skin: Negative.   Allergic/Immunologic: Negative.   Neurological: Negative.  Negative for dizziness, tremors and weakness.  Hematological: Negative.  Negative for adenopathy. Does not bruise/bleed easily.  Psychiatric/Behavioral: Negative.     Objective:  BP (!) 146/78   Pulse 75   Temp 97.9 F (36.6 C)   Resp 16   Wt 124 lb (56.2 kg)   SpO2 97%   BMI 20.01 kg/m   BP Readings from Last 3 Encounters:  12/15/15 (!) 146/78  12/08/15 (!) 146/68  12/04/15 156/63    Wt Readings from Last 3 Encounters:  12/15/15 124 lb (56.2 kg)  12/08/15 130 lb (59 kg)  12/03/15 130 lb (59 kg)    Physical Exam  Constitutional: She is oriented to person, place, and time. No distress.  HENT:  Mouth/Throat: Oropharynx is clear and moist. No oropharyngeal exudate.  Eyes: Conjunctivae are normal. Right eye exhibits no discharge. Left eye exhibits no discharge. No scleral icterus.  Neck: Normal range of motion. Neck supple. No JVD present. No tracheal deviation present. No thyromegaly present.  Cardiovascular: Normal rate, regular rhythm, normal heart sounds and intact distal pulses.  Exam reveals no gallop and no friction rub.   No murmur heard. Pulmonary/Chest: Effort normal and breath sounds normal. No stridor. No respiratory distress. She has no wheezes. She has no rales.  Chest wall is not dull to percussion. She exhibits tenderness and bony tenderness. She exhibits no mass, no crepitus, no edema, no deformity, no swelling and no retraction. Right breast exhibits no mass, no nipple discharge, no skin change and no tenderness. Left breast exhibits no mass, no nipple discharge, no skin change and no tenderness. Breasts are symmetrical.    Abdominal: Soft. Bowel sounds are normal. She exhibits no distension and no mass. There  is no tenderness. There is no rebound and no guarding.  Musculoskeletal: Normal range of motion. She exhibits no edema, tenderness or deformity.  Lymphadenopathy:    She has no cervical adenopathy.  Neurological: She is oriented to person, place, and time.  Skin: Skin is warm and dry. No rash noted. No erythema. No pallor.  Vitals reviewed.   Lab Results  Component Value Date   WBC 4.6 12/03/2015   HGB 12.1 12/03/2015   HCT 36.3 12/03/2015   PLT 191 12/03/2015   GLUCOSE 87 12/15/2015   CHOL 155 02/11/2014   TRIG 84.0 02/11/2014   HDL 46.10 02/11/2014   LDLCALC 92 02/11/2014   ALT 14 04/22/2015   AST 22 04/22/2015   NA 137 12/15/2015   K 5.2 (H) 12/15/2015   CL 102 12/15/2015   CREATININE 1.08 12/15/2015   BUN 22 12/15/2015   CO2 28 12/15/2015   TSH 0.85 12/15/2015   INR 1.61 12/03/2015   HGBA1C 5.9 06/09/2012    Dg Chest 2 View  Result Date: 12/03/2015 CLINICAL DATA:  Shortness of breath for 2 days with dizziness EXAM: CHEST  2 VIEW COMPARISON:  None. FINDINGS: Postsurgical changes in the right axilla are noted. Cardiac shadow is enlarged. A pacing device is noted in satisfactory position. Lungs are hyperinflated consistent with COPD. No focal infiltrate or sizable effusion is noted. IMPRESSION: COPD without acute abnormality. Electronically Signed   By: Alyssa Catalina M.D.   On: 12/03/2015 21:26    Assessment & Plan:   Alyssa [name] was seen today for hypothyroidism.  Diagnoses and all orders for this visit:  Essential hypertension, benign- her blood pressure is well controlled, electrolytes and renal function are stable -     Basic metabolic panel; Future  Other specified hypothyroidism- her TSH is in the normal range, she will remain on the current dose of levothyroxine. -     TSH; Future -     levothyroxine (SYNTHROID, LEVOTHROID) 88 MCG tablet; Take 1 tablet (88 mcg total) by mouth daily.  Acute chest wall pain- exam is consistent with a contusion, plain films are  unremarkable, examination of her breasts show no evidence of pathology. She will try over-the-counter symptom relief with Tylenol as needed. -     DG Sternum; Future   I have changed Alyssa [name]'s levothyroxine. I am also having her maintain her acetaminophen, loperamide, potassium chloride, furosemide, telmisartan, warfarin, (Lactobacillus Rhamnosus, GG, (CULTURELLE PO)), and furosemide.  Meds ordered this encounter  Medications  . furosemide (LASIX) 20 MG tablet    Sig: Take 20 mg by mouth.  . levothyroxine (SYNTHROID, LEVOTHROID) 88 MCG tablet    Sig: Take 1 tablet (88 mcg total) by mouth daily.    Dispense:  90 tablet    Refill:  1     Follow-up: Return in about 3 weeks (around 01/05/2016).  [name], MD

## 2015-12-16 ENCOUNTER — Other Ambulatory Visit: Payer: Self-pay

## 2015-12-16 DIAGNOSIS — E038 Other specified hypothyroidism: Secondary | ICD-10-CM

## 2015-12-16 MED ORDER — LEVOTHYROXINE SODIUM 88 MCG PO TABS: 88.0000 ug | ORAL_TABLET | Freq: Every day | ORAL | 1 refills | Status: DC

## 2015-12-16 NOTE — Telephone Encounter (Signed)
error 

## 2015-12-18 ENCOUNTER — Ambulatory Visit (INDEPENDENT_AMBULATORY_CARE_PROVIDER_SITE_OTHER): Payer: Medicare Other | Admitting: *Deleted

## 2015-12-18 DIAGNOSIS — Z5181 Encounter for therapeutic drug level monitoring: Secondary | ICD-10-CM

## 2015-12-18 DIAGNOSIS — I4891 Unspecified atrial fibrillation: Secondary | ICD-10-CM | POA: Diagnosis not present

## 2015-12-18 LAB — POCT INR: INR: 2

## 2016-01-06 ENCOUNTER — Ambulatory Visit (INDEPENDENT_AMBULATORY_CARE_PROVIDER_SITE_OTHER): Payer: Medicare Other | Admitting: *Deleted

## 2016-01-06 DIAGNOSIS — I4891 Unspecified atrial fibrillation: Secondary | ICD-10-CM

## 2016-01-06 DIAGNOSIS — Z5181 Encounter for therapeutic drug level monitoring: Secondary | ICD-10-CM

## 2016-01-06 LAB — POCT INR: INR: 4.2

## 2016-01-08 ENCOUNTER — Telehealth: Payer: Self-pay | Admitting: Internal Medicine

## 2016-01-08 NOTE — Telephone Encounter (Signed)
Patient notified

## 2016-01-08 NOTE — Telephone Encounter (Signed)
Patient with an IBS flare.  She is having diarrhea for the last few days.  Got worse after eating kale salad advised by cardiology due to coumadin diet.  She will try taking the imodium in the am daily .  She does not have an antispasmodic ordered.  Is it appropriate for her?

## 2016-01-08 NOTE — Telephone Encounter (Signed)
I would try to avoid an anti-spasmodic in her situation and stick with Imodium

## 2016-01-19 ENCOUNTER — Ambulatory Visit (INDEPENDENT_AMBULATORY_CARE_PROVIDER_SITE_OTHER): Payer: Medicare Other | Admitting: *Deleted

## 2016-01-19 DIAGNOSIS — I4891 Unspecified atrial fibrillation: Secondary | ICD-10-CM

## 2016-01-19 DIAGNOSIS — Z5181 Encounter for therapeutic drug level monitoring: Secondary | ICD-10-CM | POA: Diagnosis not present

## 2016-01-19 LAB — POCT INR: INR: 3.6

## 2016-01-19 MED ORDER — VSL#3 PO CAPS
1.0000 | ORAL_CAPSULE | Freq: Every day | ORAL | 3 refills | Status: DC
Start: 2016-01-19 — End: 2016-02-09

## 2016-01-19 NOTE — Addendum Note (Signed)
Addended by: Marlon Pel on: 01/19/2016 11:19 AM   Modules accepted: Orders

## 2016-01-19 NOTE — Telephone Encounter (Signed)
Patient would like to try VSL # 3 again.  I have sent her in a refill.

## 2016-02-03 ENCOUNTER — Encounter: Payer: Self-pay | Admitting: Internal Medicine

## 2016-02-03 ENCOUNTER — Ambulatory Visit (INDEPENDENT_AMBULATORY_CARE_PROVIDER_SITE_OTHER): Payer: Medicare Other | Admitting: Internal Medicine

## 2016-02-03 ENCOUNTER — Telehealth: Payer: Self-pay

## 2016-02-03 VITALS — BP 130/66 | HR 69 | Ht 66.0 in | Wt 130.0 lb

## 2016-02-03 DIAGNOSIS — K580 Irritable bowel syndrome with diarrhea: Secondary | ICD-10-CM | POA: Diagnosis not present

## 2016-02-03 MED ORDER — ONDANSETRON HCL 4 MG PO TABS: 4.0000 mg | ORAL_TABLET | Freq: Three times a day (TID) | ORAL | 1 refills | Status: DC | PRN

## 2016-02-03 NOTE — Progress Notes (Signed)
   Alyssa [name] 82 y.o. 31-Oct-1924 [id number]  Assessment & Plan:   Encounter Diagnosis  Name Primary?  . Irritable bowel syndrome with diarrhea Yes   Pretty consistent pattern over time without danger or worrisome signs i.e. no unintentional weight loss bleeding anemia etc. 2 colonoscopies in the past last in 2007 without neoplasia. I don't think she needs other testing at this time.  Ondansetron 4 mg before a meal or meals is worth a trial given the success of that helping diarrhea predominant irritable bowel syndrome in case series. After she left I found out she will need prior approval. We'll see. If that is unsuccessful may need to try Lotronex, also consider pancreatic enzyme supplements, versus on anti-cholinergic anti-spasmodic though I generally try to avoid those in elderly patients she is certainly overall younger than stated age if you will. Another option would be to consider IB gard  1 mo total VSL # 3 then stop  Call back w/ results  I appreciate the opportunity to care for this patient. CC: Alyssa Calico, MD   Subjective:   Chief Complaint: Diarrhea  HPI The patient is here by herself with persistent complaints of intermittent but fairly frequent postprandial diarrhea. She has a long-standing history of irritable bowel syndrome. She was last seen in February 2017, at which point she was somewhat improved on some metronidazole so we continued that for a total of 2 weeks. She's had persistent problems but lately things have worsened again since November she thinks. Imodium does help but taken regularly can cause constipation. There is mild abdominal discomfort and nausea associated with her problems. If she modifies her diet and has a relatively bland diet she tends to do okay and have soft stools. Significant quality of life impairment especially with going out to eat. Currently on week 2 of VS L #3 without significant change. She has used that in the past. Bloating and  gaseousness are also a problem. No clear obvious dietary triggers. She does not use dairy products. As you can see below weight is stable there is no bleeding or progressive change in stool size etc.  Wt Readings from Last 3 Encounters:  02/03/16 130 lb (59 kg)  12/15/15 124 lb (56.2 kg)  12/08/15 130 lb (59 kg)   Medications, allergies, past medical history, past surgical history, family history and social history are reviewed and updated in the EMR.  Review of Systems As per history of present illness. She's had some visitors, her son was with her for about a month she has grandchildren and a great-grandchild coming and other visitors which is a source of some stress but mostly good.  Objective:   Physical Exam BP 130/66   Pulse 69   Ht 5\' 6"  (1.676 m)   Wt 130 lb (59 kg)   BMI 20.98 kg/m  NAD Looks younger than stated age Eye anicteric abd soft mildly tender LLQ without masses  Data reviewed:  Thyroid testing CBC electrolytes are all normal or just mildly abnormal electrolytes with a slightly elevated potassium at one point. This is for 2017. I reviewed previous GI notes, I had seen her first in 2012 with notation of IBS problems.  25 minutes time spent with patient > half in counseling coordination of care

## 2016-02-03 NOTE — Patient Instructions (Addendum)
  We have sent the following medications to your pharmacy for you to pick up at your convenience: Ondansetron, ( called pharmacy and rx needs a prior authorization)   Please call us and let us know how that's helping.     I appreciate the opportunity to care for you. Silvano Rusk, MD, [facility]

## 2016-02-03 NOTE — Telephone Encounter (Signed)
Have started a prior authorization on cover my meds for the ondansetron 4mg  tablets, and I spoke to a rep who said hopefully we will hear back with in 24-72 hours. The patient is aware.

## 2016-02-04 ENCOUNTER — Telehealth: Payer: Self-pay | Admitting: Internal Medicine

## 2016-02-04 NOTE — Telephone Encounter (Signed)
Patient asks what can be done about her diarrhea until zofran can get prior authorization?

## 2016-02-04 NOTE — Telephone Encounter (Signed)
Let's have her try IB GArd 2 before breakfast and 2 before supper

## 2016-02-04 NOTE — Telephone Encounter (Signed)
Patient notified

## 2016-02-05 ENCOUNTER — Ambulatory Visit (INDEPENDENT_AMBULATORY_CARE_PROVIDER_SITE_OTHER): Payer: Medicare Other | Admitting: *Deleted

## 2016-02-05 DIAGNOSIS — Z5181 Encounter for therapeutic drug level monitoring: Secondary | ICD-10-CM | POA: Diagnosis not present

## 2016-02-05 DIAGNOSIS — I482 Chronic atrial fibrillation: Secondary | ICD-10-CM | POA: Diagnosis not present

## 2016-02-05 DIAGNOSIS — I4821 Permanent atrial fibrillation: Secondary | ICD-10-CM

## 2016-02-05 LAB — POCT INR: INR: 5.2

## 2016-02-05 NOTE — Telephone Encounter (Signed)
Ondansetron has been approved thru 02/02/2017.  [facility] pharmacy and patient informed.  She said she will pick it up today.

## 2016-02-05 NOTE — Telephone Encounter (Signed)
[employer] called and states zolfran medication has been approved

## 2016-02-09 ENCOUNTER — Emergency Department (HOSPITAL_COMMUNITY)
Admission: EM | Admit: 2016-02-09 | Discharge: 2016-02-09 | Disposition: A | Discharge status: Home / Self Care | Payer: Medicare Other | Attending: Emergency Medicine | Admitting: Emergency Medicine

## 2016-02-09 ENCOUNTER — Encounter (HOSPITAL_COMMUNITY): Payer: Self-pay | Admitting: Emergency Medicine

## 2016-02-09 DIAGNOSIS — Z950 Presence of cardiac pacemaker: Secondary | ICD-10-CM | POA: Diagnosis not present

## 2016-02-09 DIAGNOSIS — Z79899 Other long term (current) drug therapy: Secondary | ICD-10-CM | POA: Diagnosis not present

## 2016-02-09 DIAGNOSIS — E039 Hypothyroidism, unspecified: Secondary | ICD-10-CM | POA: Diagnosis not present

## 2016-02-09 DIAGNOSIS — Z87891 Personal history of nicotine dependence: Secondary | ICD-10-CM | POA: Insufficient documentation

## 2016-02-09 DIAGNOSIS — Z85850 Personal history of malignant neoplasm of thyroid: Secondary | ICD-10-CM | POA: Insufficient documentation

## 2016-02-09 DIAGNOSIS — I10 Essential (primary) hypertension: Secondary | ICD-10-CM | POA: Insufficient documentation

## 2016-02-09 DIAGNOSIS — Z7901 Long term (current) use of anticoagulants: Secondary | ICD-10-CM | POA: Insufficient documentation

## 2016-02-09 DIAGNOSIS — R197 Diarrhea, unspecified: Secondary | ICD-10-CM | POA: Diagnosis not present

## 2016-02-09 LAB — COMPREHENSIVE METABOLIC PANEL
ALT: 16 U/L (ref 14–54)
ANION GAP: 9 (ref 5–15)
AST: 23 U/L (ref 15–41)
Albumin: 4.6 g/dL (ref 3.5–5.0)
Alkaline Phosphatase: 74 U/L (ref 38–126)
BUN: 10 mg/dL (ref 6–20)
CO2: 25 mmol/L (ref 22–32)
Calcium: 8.9 mg/dL (ref 8.9–10.3)
Chloride: 96 mmol/L — ABNORMAL LOW (ref 101–111)
Creatinine, Ser: 1.17 mg/dL — ABNORMAL HIGH (ref 0.44–1.00)
GFR, EST AFRICAN AMERICAN: 46 mL/min — AB (ref >60)
GFR, EST NON AFRICAN AMERICAN: 40 mL/min — AB (ref >60)
Glucose, Bld: 108 mg/dL — ABNORMAL HIGH (ref 65–99)
Potassium: 4.6 mmol/L (ref 3.5–5.1)
SODIUM: 130 mmol/L — AB (ref 135–145)
TOTAL PROTEIN: 6.9 g/dL (ref 6.5–8.1)
Total Bilirubin: 0.5 mg/dL (ref 0.3–1.2)

## 2016-02-09 LAB — URINALYSIS, ROUTINE W REFLEX MICROSCOPIC
BILIRUBIN URINE: NEGATIVE
GLUCOSE, UA: NEGATIVE mg/dL
KETONES UR: NEGATIVE mg/dL
NITRITE: NEGATIVE
PROTEIN: NEGATIVE mg/dL
Specific Gravity, Urine: 1.005 (ref 1.005–1.030)
pH: 5 (ref 5.0–8.0)

## 2016-02-09 LAB — CBC
HCT: 35.7 % — ABNORMAL LOW (ref 36.0–46.0)
HEMOGLOBIN: 12.1 g/dL (ref 12.0–15.0)
MCH: 30.1 pg (ref 26.0–34.0)
MCHC: 33.9 g/dL (ref 30.0–36.0)
MCV: 88.8 fL (ref 78.0–100.0)
Platelets: 259 10*3/uL (ref 150–400)
RBC: 4.02 MIL/uL (ref 3.87–5.11)
RDW: 11.9 % (ref 11.5–15.5)
WBC: 3.7 10*3/uL — AB (ref 4.0–10.5)

## 2016-02-09 LAB — LIPASE, BLOOD: Lipase: 26 U/L (ref 11–51)

## 2016-02-09 MED ORDER — DIPHENOXYLATE-ATROPINE 2.5-0.025 MG PO TABS: 1.0000 | ORAL_TABLET | Freq: Four times a day (QID) | ORAL | 0 refills | Status: DC | PRN

## 2016-02-09 NOTE — ED Notes (Signed)
Dr. Mesner at bedside   

## 2016-02-09 NOTE — ED Notes (Addendum)
Pt says that diarrhea has been worse over past 4 days and is exacerbated by eating. To relieve diarrhea she has avoided eating and has continued to have 4 episodes of diarrhea in past 24 hours. She has hx of IBS and has been seen by her pcp but symptoms persist. She complains of weakness and shakiness.'

## 2016-02-09 NOTE — ED Provider Notes (Signed)
[facility] DEPT Provider Note   CSN: [id number] Arrival date & time: 02/09/16  1218     History   Chief Complaint Chief Complaint  Patient presents with  . Diarrhea    HPI Alyssa [name] is a 83 y.o. female.   Diarrhea   This is a chronic problem. The current episode started more than 1 week ago. The problem occurs 2 to 4 times per day. The problem has been gradually worsening. The stool consistency is described as watery. There has been no fever. Pertinent negatives include no vomiting, no chills and no headaches. She has tried anti-motility drugs, analgesics, a change of diet and increased fluid intake for the symptoms. The treatment provided no relief.    Past Medical History:  Diagnosis Date  . Atrial fibrillation ([place])    pacemaker, chronic anticoag  . Diverticulosis   . GERD (gastroesophageal reflux disease)   . Hx of breast cancer   . Hyperkalemia   . Hypothyroidism   . IBS (irritable bowel syndrome)   . Internal hemorrhoids   . Left inguinal hernia    direct  . MVP (mitral valve prolapse)   . Osteoporosis   . Renal insufficiency   . Thyroid cancer [facility])     Patient Active Problem List   Diagnosis Date Noted  . Acute chest wall pain 12/15/2015  . Essential hypertension, benign 09/04/2015  . Severe tricuspid regurgitation 04/22/2015  . Complete heart block ([place]) 02/26/2015  . Routine general medical examination at a health care facility 02/11/2014  . Encounter for therapeutic drug monitoring 07/23/2013  .  atrial fibrillation 10/11/2012  . Chronic renal insufficiency, stage III (moderate) 06/12/2012  . Pacemaker-dual chamber  06/23/2010  . Allergic rhinitis 05/20/2010  . Irritable bowel syndrome 12/01/2009  . GERD 06/10/2009  . Hypothyroidism 02/26/2008  . OSTEOPOROSIS 02/26/2008    Past Surgical History:  Procedure Laterality Date  . COLONOSCOPY  10/28/2005   internal hemorrhoids, diverticulosis (same as in 2002 and random bxs negative then)  .  EP IMPLANTABLE DEVICE N/A 02/26/2015   Procedure: PPM Generator Changeout;  Surgeon: [name], MD;  Location: [facility];  Service: Cardiovascular;  Laterality: N/A;  . MASTECTOMY  1982   Bilateral  . PACEMAKER INSERTION    . THYROIDECTOMY    . TONSILLECTOMY    . UPPER GASTROINTESTINAL ENDOSCOPY  09/06/2000   normal    OB History    No data available       Home Medications    Prior to Admission medications   Medication Sig Start Date End Date Taking? Authorizing Provider  acetaminophen (TYLENOL ARTHRITIS PAIN) 650 MG CR tablet Take 1,300 mg by mouth 2 (two) times daily.     Yes Historical Provider, MD  furosemide (LASIX) 40 MG tablet Take 20 mg by mouth daily.   Yes Historical Provider, MD  levothyroxine (SYNTHROID, LEVOTHROID) 88 MCG tablet Take 1 tablet (88 mcg total) by mouth daily. 12/16/15  Yes [name], MD  ondansetron (ZOFRAN) 4 MG tablet Take 1 tablet (4 mg total) by mouth every 8 (eight) hours as needed for nausea (diarrhea). 02/03/16  Yes Gatha Mayer, MD  telmisartan (MICARDIS) 20 MG tablet Take 1 tablet (20 mg total) by mouth daily. Patient taking differently: Take 20 mg by mouth every evening.  09/04/15  Yes [name], MD  warfarin (COUMADIN) 3 MG tablet USE AS DIRECTED BY COUMADIN CLINIC Patient taking differently: Take 1.5- 3mg  by mouth once daily; take 1.5mg   on Sunday, Tuesday, and Thursday, and then take 3mg  on all other days 11/26/15  Yes [name], MD  diphenoxylate-atropine (LOMOTIL) 2.5-0.025 MG tablet Take 1 tablet by mouth 4 (four) times daily as needed for diarrhea or loose stools. 02/09/16   Merrily Pew, MD  furosemide (LASIX) 20 MG tablet Take by mouth daily.     Historical Provider, MD    Family History Family History  Problem Relation Age of Onset  . Colon cancer Father 25  . Heart disease Father   . Stroke Mother   . Arthritis Other   . Hypertension Other   . Esophageal cancer Neg Hx   . Pancreatic cancer Neg Hx   .  Kidney disease Neg Hx   . Liver disease Neg Hx   . Diabetes Neg Hx     Social History Social History  Substance Use Topics  . Smoking status: Former Smoker    Quit date: 02/08/1954  . Smokeless tobacco: Never Used  . Alcohol use No     Allergies   Patient has no known allergies.   Review of Systems Review of Systems  Constitutional: Negative for chills and fever.  Gastrointestinal: Positive for diarrhea. Negative for vomiting.  Neurological: Negative for headaches.  All other systems reviewed and are negative.    Physical Exam Updated Vital Signs BP 149/87 (BP Location: Left Arm)   Pulse 62   Temp 98.1 F (36.7 C) (Oral)   Resp 16   Ht 5' 6.75" (1.695 m)   Wt 127 lb 9.6 oz (57.9 kg)   SpO2 100%   BMI 20.13 kg/m   Physical Exam  Constitutional: She is oriented to person, place, and time. She appears well-developed and well-nourished.  HENT:  Head: Normocephalic and atraumatic.  Eyes: Conjunctivae and EOM are normal.  Neck: Normal range of motion.  Cardiovascular: Normal rate and regular rhythm.   Pulmonary/Chest: No stridor. No respiratory distress.  Abdominal: Soft. She exhibits no distension. There is no tenderness.  Neurological: She is alert and oriented to person, place, and time. No cranial nerve deficit. Coordination normal.  Skin: Skin is warm and dry. No pallor.  Nursing note and vitals reviewed.    ED Treatments / Results  Labs (all labs ordered are listed, but only abnormal results are displayed) Labs Reviewed  COMPREHENSIVE METABOLIC PANEL - Abnormal; Notable for the following:       Result Value   Sodium 130 (*)    Chloride 96 (*)    Glucose, Bld 108 (*)    Creatinine, Ser 1.17 (*)    GFR calc non Af Amer 40 (*)    GFR calc Af Amer 46 (*)    All other components within normal limits  CBC - Abnormal; Notable for the following:    WBC 3.7 (*)    HCT 35.7 (*)    All other components within normal limits  URINALYSIS, ROUTINE W REFLEX  MICROSCOPIC - Abnormal; Notable for the following:    Color, Urine STRAW (*)    Hgb urine dipstick SMALL (*)    Leukocytes, UA SMALL (*)    Bacteria, UA RARE (*)    Squamous Epithelial / LPF 0-5 (*)    All other components within normal limits  URINE CULTURE  LIPASE, BLOOD    EKG  EKG Interpretation None       Radiology No results found.  Procedures Procedures (including critical care time)  Medications Ordered in ED Medications - No data to display  Initial Impression / Assessment and Plan / ED Course  I have reviewed the triage vital signs and the nursing notes.  Pertinent labs & imaging results that were available during my care of the patient were reviewed by me and considered in my medical decision making (see chart for details).  Clinical Course    Has chronic diarrhea spoke with her gastroenterologist who will see in the office this week. Asked that I start her on Lomotil, stop Imodium and for her to collect a sample to bring into the office to test for C. difficile.  Final Clinical Impressions(s) / ED Diagnoses   Final diagnoses:  Diarrhea, unspecified type    New Prescriptions New Prescriptions   DIPHENOXYLATE-ATROPINE (LOMOTIL) 2.5-0.025 MG TABLET    Take 1 tablet by mouth 4 (four) times daily as needed for diarrhea or loose stools.     Merrily Pew, MD 02/09/16 [phone]

## 2016-02-09 NOTE — ED Triage Notes (Signed)
Patient has hx of IBS and states she is having a flare up x4 days. Patient has tried zofran and imodium without relief. Reports abdominal pain/diarrhea. Denies n/v or blood in stool/urine.

## 2016-02-09 NOTE — ED Notes (Signed)
Pt ambulated to bathroom 

## 2016-02-09 NOTE — ED Notes (Signed)
Spoke with Dr.Mesner and was advised he would be coming to speak with pt in the next 5 minutes.

## 2016-02-10 ENCOUNTER — Other Ambulatory Visit: Payer: Self-pay

## 2016-02-10 ENCOUNTER — Telehealth: Payer: Self-pay | Admitting: Internal Medicine

## 2016-02-10 DIAGNOSIS — Z79899 Other long term (current) drug therapy: Secondary | ICD-10-CM | POA: Diagnosis not present

## 2016-02-10 DIAGNOSIS — R197 Diarrhea, unspecified: Secondary | ICD-10-CM

## 2016-02-10 DIAGNOSIS — Z85850 Personal history of malignant neoplasm of thyroid: Secondary | ICD-10-CM | POA: Diagnosis not present

## 2016-02-10 DIAGNOSIS — E039 Hypothyroidism, unspecified: Secondary | ICD-10-CM | POA: Diagnosis not present

## 2016-02-10 DIAGNOSIS — I10 Essential (primary) hypertension: Secondary | ICD-10-CM | POA: Diagnosis not present

## 2016-02-10 DIAGNOSIS — Z950 Presence of cardiac pacemaker: Secondary | ICD-10-CM | POA: Diagnosis not present

## 2016-02-10 DIAGNOSIS — Z7901 Long term (current) use of anticoagulants: Secondary | ICD-10-CM | POA: Diagnosis not present

## 2016-02-10 DIAGNOSIS — Z87891 Personal history of nicotine dependence: Secondary | ICD-10-CM | POA: Diagnosis not present

## 2016-02-10 NOTE — Telephone Encounter (Signed)
Please get a sx update  Given persistent problematic sxs I suggest a colonoscopy  Will need to hold warfarin x 3-5 d and check with rxer  Hopefully Lomotil will provide some relief  If ALL stools watery or most are then we can check C diff PCR

## 2016-02-10 NOTE — Telephone Encounter (Signed)
I expect we could get it done sooner if its needed.  Let's get the C diff test. Also order a stool for fecal elastase

## 2016-02-10 NOTE — Telephone Encounter (Signed)
Patient calling back regarding this. Best # [phone]

## 2016-02-10 NOTE — Telephone Encounter (Signed)
Pt states she had diarrhea this morning but has not had another BM since. She has been eating the brat diet and does not feel well, states she is weak. Pt reports she is losing weight. She does want to proceed with colonoscopy. She states she will come for cdiff test if still having diarrhea tomorrow. Discussed with pt that first available appt for colon in the [facility] is 03/08/16. Pt concerned that this date is so far out. Pt asking if can be done any sooner. Please advise.

## 2016-02-11 ENCOUNTER — Telehealth: Payer: Self-pay | Admitting: *Deleted

## 2016-02-11 LAB — URINE CULTURE

## 2016-02-11 NOTE — Telephone Encounter (Signed)
Patient notified

## 2016-02-11 NOTE — Telephone Encounter (Signed)
Pt states she is continuing to have diarrhea and is for further stool specimen test today and is now scheduled for colonoscopy on Jan 29th Pt thought her INR had been done when went to ER on Jan 1st but it was not done so instructed that she needs to keep her appt in the coumadin clinic as ordered on Jan 5th and she states understanding. Also instructed to call and let us know if her colonoscopy has been moved up

## 2016-02-12 ENCOUNTER — Other Ambulatory Visit: Payer: Medicare Other

## 2016-02-12 ENCOUNTER — Telehealth: Payer: Self-pay | Admitting: Internal Medicine

## 2016-02-12 DIAGNOSIS — R197 Diarrhea, unspecified: Secondary | ICD-10-CM | POA: Diagnosis not present

## 2016-02-12 NOTE — Telephone Encounter (Signed)
Patient notified results are not back.  We will call her with the results when they are available and have been reviewed

## 2016-02-13 ENCOUNTER — Ambulatory Visit (INDEPENDENT_AMBULATORY_CARE_PROVIDER_SITE_OTHER): Payer: Medicare Other | Admitting: Pharmacist

## 2016-02-13 DIAGNOSIS — I482 Chronic atrial fibrillation: Secondary | ICD-10-CM | POA: Diagnosis not present

## 2016-02-13 DIAGNOSIS — Z5181 Encounter for therapeutic drug level monitoring: Secondary | ICD-10-CM | POA: Diagnosis not present

## 2016-02-13 DIAGNOSIS — I4821 Permanent atrial fibrillation: Secondary | ICD-10-CM

## 2016-02-13 LAB — CLOSTRIDIUM DIFFICILE BY PCR: Toxigenic C. Difficile by PCR: NOT DETECTED

## 2016-02-13 LAB — POCT INR: INR: 3.9

## 2016-02-13 NOTE — Progress Notes (Signed)
No C diff Hope she is better Please check on her

## 2016-02-16 ENCOUNTER — Telehealth: Payer: Self-pay | Admitting: Internal Medicine

## 2016-02-16 NOTE — Telephone Encounter (Signed)
Patient advised to continue the lomotil as prescribed.  She is asking for an anti-anxiety medication. She is advised that she should see and discuss this with her primary care.

## 2016-02-16 NOTE — Telephone Encounter (Signed)
Pt called stating she has some really bad diarrhea and she spoke to Dr. Marla Roe office and he told her to contact us for anxiety. Can you please give her a call at [phone]

## 2016-02-16 NOTE — Telephone Encounter (Signed)
Contacted pt and pt states that she is having IBS symptoms and they have been persistent since October and has been taking Lomotol . Dr. Carlean Purl is treating her but pt states that she feels that she would benefit from something to "calm her nerves".   Informed pt that she would need an appt with PCP to start a prescription for that.  Pt agreed and appt has been made.

## 2016-02-17 ENCOUNTER — Ambulatory Visit: Payer: Medicare Other | Admitting: Internal Medicine

## 2016-02-17 ENCOUNTER — Ambulatory Visit (INDEPENDENT_AMBULATORY_CARE_PROVIDER_SITE_OTHER): Payer: Medicare Other | Admitting: *Deleted

## 2016-02-17 DIAGNOSIS — Z5181 Encounter for therapeutic drug level monitoring: Secondary | ICD-10-CM | POA: Diagnosis not present

## 2016-02-17 DIAGNOSIS — I4821 Permanent atrial fibrillation: Secondary | ICD-10-CM

## 2016-02-17 DIAGNOSIS — I482 Chronic atrial fibrillation: Secondary | ICD-10-CM | POA: Diagnosis not present

## 2016-02-17 LAB — POCT INR: INR: 3.4

## 2016-02-18 ENCOUNTER — Ambulatory Visit (INDEPENDENT_AMBULATORY_CARE_PROVIDER_SITE_OTHER): Payer: Medicare Other | Admitting: Family

## 2016-02-18 ENCOUNTER — Encounter: Payer: Self-pay | Admitting: Family

## 2016-02-18 VITALS — BP 116/78 | HR 67 | Temp 98.0°F | Resp 16 | Ht 66.75 in | Wt 124.0 lb

## 2016-02-18 DIAGNOSIS — F411 Generalized anxiety disorder: Secondary | ICD-10-CM | POA: Insufficient documentation

## 2016-02-18 DIAGNOSIS — K580 Irritable bowel syndrome with diarrhea: Secondary | ICD-10-CM | POA: Diagnosis not present

## 2016-02-18 DIAGNOSIS — F418 Other specified anxiety disorders: Secondary | ICD-10-CM | POA: Diagnosis not present

## 2016-02-18 LAB — PANCREATIC ELASTASE, FECAL: PANCREATIC ELASTASE-1, STL: 400 ug/g

## 2016-02-18 MED ORDER — SERTRALINE HCL 25 MG PO TABS: 25.0000 mg | ORAL_TABLET | Freq: Every day | ORAL | 0 refills | Status: DC

## 2016-02-18 NOTE — Telephone Encounter (Signed)
Spoke to her - starting sertraline Will give me an update after her company is gone

## 2016-02-18 NOTE — Patient Instructions (Signed)
Thank you for choosing [facility].  SUMMARY AND INSTRUCTIONS:  Medication:  Start the sertralline daily.   Your prescription(s) have been submitted to your pharmacy or been printed and provided for you. Please take as directed and contact our office if you believe you are having problem(s) with the medication(s) or have any questions.   Follow up:  If your symptoms worsen or fail to improve, please contact our office for further instruction, or in case of emergency go directly to the emergency room at the closest medical facility.    Diet for Irritable Bowel Syndrome Introduction When you have irritable bowel syndrome (IBS), the foods you eat and your eating habits are very important. IBS may cause various symptoms, such as abdominal pain, constipation, or diarrhea. Choosing the right foods can help ease discomfort caused by these symptoms. Work with your health care provider and dietitian to find the best eating plan to help control your symptoms. What general guidelines do I need to follow?  Keep a food diary. This will help you identify foods that cause symptoms. Write down:  What you eat and when.  What symptoms you have.  When symptoms occur in relation to your meals.  Avoid foods that cause symptoms. Talk with your dietitian about other ways to get the same nutrients that are in these foods.  Eat more foods that contain fiber. Take a fiber supplement if directed by your dietitian.  Eat your meals slowly, in a relaxed setting.  Aim to eat 5-6 small meals per day. Do not skip meals.  Drink enough fluids to keep your urine clear or pale yellow.  Ask your health care provider if you should take an over-the-counter probiotic during flare-ups to help restore healthy gut bacteria.  If you have cramping or diarrhea, try making your meals low in fat and high in carbohydrates. Examples of carbohydrates are pasta, rice, whole grain breads and cereals, fruits, and  vegetables.  If dairy products cause your symptoms to flare up, try eating less of them. You might be able to handle yogurt better than other dairy products because it contains bacteria that help with digestion. What foods are not recommended? The following are some foods and drinks that may worsen your symptoms:  Fatty foods, such as [place] fries.  Milk products, such as cheese or ice cream.  Chocolate.  Alcohol.  Products with caffeine, such as coffee.  Carbonated drinks, such as soda. The items listed above may not be a complete list of foods and beverages to avoid. Contact your dietitian for more information.  What foods are good sources of fiber? Your health care provider or dietitian may recommend that you eat more foods that contain fiber. Fiber can help reduce constipation and other IBS symptoms. Add foods with fiber to your diet a little at a time so that your body can get used to them. Too much fiber at once might cause gas and swelling of your abdomen. The following are some foods that are good sources of fiber:  Apples.  Peaches.  Pears.  Berries.  Figs.  Broccoli (raw).  Cabbage.  Carrots.  Raw peas.  Kidney beans.  Lima beans.  Whole grain bread.  Whole grain cereal. Where to find more information: [employer] for Functional Gastrointestinal Disorders: www.iffgd.[employer] of Diabetes and Digestive and Kidney Diseases: NetworkAffair.co.za.aspx This information is not intended to replace advice given to you by your health care provider. Make sure you discuss any questions you have with your  health care provider. Document Released: 04/17/2003 Document Revised: 07/03/2015 Document Reviewed: 04/27/2013  2017 Elsevier

## 2016-02-18 NOTE — Assessment & Plan Note (Addendum)
This is a new problem. Symptoms are consistent with situational anxiety related to family stressors in addition to chronic medical conditions. Discussed stress and stress management. Declines counseling at this time.  She expresses concern for possible relation to-year-old bowel syndrome exacerbation related to stress. Start low-dose sertraline. Recommend follow-up in one month to determine effectiveness.

## 2016-02-18 NOTE — Progress Notes (Signed)
Subjective:    Patient ID: Alyssa [name], female    DOB: 1924/08/19, 83 y.o.   MRN: [id number]  Chief Complaint  Patient presents with  . Anxiety    States that she has IBS which is causing her some anxiety    HPI:  Alyssa [name] is a 83 y.o. female who  has a past medical history of Atrial fibrillation ([place]); Diverticulosis; GERD (gastroesophageal reflux disease); breast cancer; Hyperkalemia; Hypothyroidism; IBS (irritable bowel syndrome); Internal hemorrhoids; Left inguinal hernia; MVP (mitral valve prolapse); Osteoporosis; Renal insufficiency; and Thyroid cancer ([place]). and presents today for an acute office visit.   IBS - Describes that she has been working with Dr. Carlean Purl of gastroenterology and has had an exacerbation of IBS-D and been on several medications with the current medication being Lomotil. Describes that she has been having increased anxiety and notes that she has had a lot of stress related to family visits and her IBS. She does exercise regularly to help reduce her stress.   No Known Allergies    Outpatient Medications Prior to Visit  Medication Sig Dispense Refill  . acetaminophen (TYLENOL ARTHRITIS PAIN) 650 MG CR tablet Take 1,300 mg by mouth 2 (two) times daily.      . diphenoxylate-atropine (LOMOTIL) 2.5-0.025 MG tablet Take 1 tablet by mouth 4 (four) times daily as needed for diarrhea or loose stools. 30 tablet 0  . furosemide (LASIX) 20 MG tablet Take by mouth daily.     . furosemide (LASIX) 40 MG tablet Take 20 mg by mouth daily.    Marland Kitchen levothyroxine (SYNTHROID, LEVOTHROID) 88 MCG tablet Take 1 tablet (88 mcg total) by mouth daily. 90 tablet 1  . ondansetron (ZOFRAN) 4 MG tablet Take 1 tablet (4 mg total) by mouth every 8 (eight) hours as needed for nausea (diarrhea). 60 tablet 1  . telmisartan (MICARDIS) 20 MG tablet Take 1 tablet (20 mg total) by mouth daily. (Patient taking differently: Take 20 mg by mouth every evening. ) 90 tablet 3  . warfarin (COUMADIN) 3  MG tablet USE AS DIRECTED BY COUMADIN CLINIC (Patient taking differently: Take 1.5- 3mg  by mouth once daily; take 1.5mg  on Sunday, Tuesday, and Thursday, and then take 3mg  on all other days) 30 tablet 3   No facility-administered medications prior to visit.       Past Surgical History:  Procedure Laterality Date  . COLONOSCOPY  10/28/2005   internal hemorrhoids, diverticulosis (same as in 2002 and random bxs negative then)  . EP IMPLANTABLE DEVICE N/A 02/26/2015   Procedure: PPM Generator Changeout;  Surgeon: [name], MD;  Location: [facility];  Service: Cardiovascular;  Laterality: N/A;  . MASTECTOMY  1982   Bilateral  . PACEMAKER INSERTION    . THYROIDECTOMY    . TONSILLECTOMY    . UPPER GASTROINTESTINAL ENDOSCOPY  09/06/2000   normal      Past Medical History:  Diagnosis Date  . Atrial fibrillation ([place])    pacemaker, chronic anticoag  . Diverticulosis   . GERD (gastroesophageal reflux disease)   . Hx of breast cancer   . Hyperkalemia   . Hypothyroidism   . IBS (irritable bowel syndrome)   . Internal hemorrhoids   . Left inguinal hernia    direct  . MVP (mitral valve prolapse)   . Osteoporosis   . Renal insufficiency   . Thyroid cancer ([place])       Review of Systems  Constitutional: Negative for chills and fever.  Respiratory: Negative for chest tightness and shortness of breath.   Cardiovascular: Negative for chest pain, palpitations and leg swelling.      Objective:    BP 116/78 (BP Location: Left Arm, Patient Position: Sitting, Cuff Size: Normal)   Pulse 67   Temp 98 F (36.7 C) (Oral)   Resp 16   Ht 5' 6.75" (1.695 m)   Wt 124 lb (56.2 kg)   SpO2 99%   BMI 19.57 kg/m  Nursing note and vital signs reviewed.  Physical Exam  Constitutional: She is oriented to person, place, and time. She appears well-developed and well-nourished. No distress.  Cardiovascular: Normal rate, regular rhythm, normal heart sounds and intact distal pulses.     Pulmonary/Chest: Effort normal and breath sounds normal.  Abdominal: Bowel sounds are normal. She exhibits no mass. There is no hepatosplenomegaly. There is tenderness in the periumbilical area, suprapubic area and left lower quadrant. There is no rigidity, no rebound, no guarding, no tenderness at McBurney's point and negative Murphy's sign.  Neurological: She is alert and oriented to person, place, and time.  Skin: Skin is warm and dry.  Psychiatric: She has a normal mood and affect. Her behavior is normal. Judgment and thought content normal.       Assessment & Plan:   Problem List Items Addressed This Visit      Digestive   Irritable bowel syndrome    Irritable bowel syndrome currently maintained on Lomotil and appears stable. Information on irritable bowel syndrome nutritional intake provided and after visit summary. Continue current dosage of Lomotil with changes per gastroenterology.        Other   Situational anxiety - Primary    This is a new problem. Symptoms are consistent with situational anxiety related to family stressors in addition to chronic medical conditions. Discussed stress and stress management. Declines counseling at this time.  She expresses concern for possible relation to-year-old bowel syndrome exacerbation related to stress. Start low-dose sertraline. Recommend follow-up in one month to determine effectiveness.      Relevant Medications   sertraline (ZOLOFT) 25 MG tablet       I am having [name] start on sertraline. I am also having her maintain her acetaminophen, telmisartan, warfarin, furosemide, levothyroxine, ondansetron, furosemide, and diphenoxylate-atropine.   Meds ordered this encounter  Medications  . sertraline (ZOLOFT) 25 MG tablet    Sig: Take 1 tablet (25 mg total) by mouth daily.    Dispense:  30 tablet    Refill:  0    Order Specific Question:   Supervising Provider    Answer:   Pricilla Holm A [id number]     Follow-up:  Return if symptoms worsen or fail to improve.  Mauricio Po, FNP

## 2016-02-18 NOTE — Assessment & Plan Note (Signed)
Irritable bowel syndrome currently maintained on Lomotil and appears stable. Information on irritable bowel syndrome nutritional intake provided and after visit summary. Continue current dosage of Lomotil with changes per gastroenterology.

## 2016-02-23 ENCOUNTER — Ambulatory Visit: Payer: Medicare Other | Admitting: Family

## 2016-02-28 ENCOUNTER — Other Ambulatory Visit: Payer: Self-pay | Admitting: Nurse Practitioner

## 2016-02-28 ENCOUNTER — Telehealth: Payer: Self-pay | Admitting: Nurse Practitioner

## 2016-02-28 ENCOUNTER — Other Ambulatory Visit: Payer: Self-pay | Admitting: Gastroenterology

## 2016-02-28 DIAGNOSIS — K591 Functional diarrhea: Secondary | ICD-10-CM

## 2016-02-28 MED ORDER — DIPHENOXYLATE-ATROPINE 2.5-0.025 MG PO TABS: 1.0000 | ORAL_TABLET | Freq: Four times a day (QID) | ORAL | 0 refills | Status: DC | PRN

## 2016-02-28 NOTE — Telephone Encounter (Signed)
Patient spoke with Dr. Carlean Purl last week at which time she thought diarrhea was better, Didn't take Lomotil for a couple of days but now diarrhea is horrible.  Patient only has one Lomotil left, wants a refill. Will send Rx of Lomotil #30 to [facility] on [address].

## 2016-02-29 ENCOUNTER — Other Ambulatory Visit: Payer: Self-pay | Admitting: Internal Medicine

## 2016-02-29 DIAGNOSIS — R197 Diarrhea, unspecified: Secondary | ICD-10-CM

## 2016-02-29 MED ORDER — DIPHENOXYLATE-ATROPINE 2.5-0.025 MG PO TABS: 1.0000 | ORAL_TABLET | Freq: Four times a day (QID) | ORAL | 0 refills | Status: DC | PRN

## 2016-02-29 NOTE — Progress Notes (Signed)
Renewing Lomotil

## 2016-02-29 NOTE — Telephone Encounter (Signed)
Refilled electronically Please confirm this was done and let patient know

## 2016-03-01 NOTE — Telephone Encounter (Signed)
I spoke with the pharmacy they do have rx, but her insurance will not pay until 03/04/16.  Patient has picked up rx

## 2016-03-02 ENCOUNTER — Ambulatory Visit (INDEPENDENT_AMBULATORY_CARE_PROVIDER_SITE_OTHER): Payer: Medicare Other | Admitting: *Deleted

## 2016-03-02 DIAGNOSIS — I482 Chronic atrial fibrillation: Secondary | ICD-10-CM | POA: Diagnosis not present

## 2016-03-02 DIAGNOSIS — Z5181 Encounter for therapeutic drug level monitoring: Secondary | ICD-10-CM

## 2016-03-02 DIAGNOSIS — I4821 Permanent atrial fibrillation: Secondary | ICD-10-CM

## 2016-03-02 LAB — POCT INR: INR: 3.6

## 2016-03-07 NOTE — Progress Notes (Deleted)
Cardiology Office Note Date:  03/07/2016  Patient ID:  Alyssa [name], Alyssa [name] Jan 06, 1925, MRN [id number] PCP:  [name], MD  Electrophysiologist: Dr. Caryl Comes   Chief Complaint: planned f/u  History of Present Illness: Alyssa [name] is a 83 y.o. female with history of CHB w/PPM, permanent AFib, hypothyroidism, IBS, GERD, LE edema/DOE that waxes and wanes, at her last visit with Dr. Caryl Comes, he mentioned historically a syncopal episode after prolonged standing and in the heat. She was in the ER 12/04/15 with an episode of dizziness/near syncope that occurred upon standing and SOB, orthostatic vitals were reported as negative, CXR negative, BMET/CBC stable, and pacer check with normal function and discharged from the ER to keep her visit in the office today.  She comes in today to be seen for a planned visit, again for Dr. Caryl Comes, she was last seen by myself in October noting somew continued orthostatic symptoms "hazy" feeling, discussed balance of lasix/hydration, and intermittent bout of IBS/diarrhea contributing as well.  Since then she has been feeling ***   *** bleeding/warfarin *** orthostatic? *** symptoms *** fluid status, how much lasix has she needed? *** labs/K+ *** HR histogram any better? Reprogrammed last visit to improve HR response to activity   DEVICE information: MDT dual chamber PPM, implanted 10/30/1998 with gen change 02/26/15, Dr. Caryl Comes, [address]   Past Medical History:  Diagnosis Date  . Atrial fibrillation ([place])    pacemaker, chronic anticoag  . Diverticulosis   . GERD (gastroesophageal reflux disease)   . Hx of breast cancer   . Hyperkalemia   . Hypothyroidism   . IBS (irritable bowel syndrome)   . Internal hemorrhoids   . Left inguinal hernia    direct  . MVP (mitral valve prolapse)   . Osteoporosis   . Renal insufficiency   . Thyroid cancer [facility])     Past Surgical History:  Procedure Laterality Date  . COLONOSCOPY  10/28/2005   internal hemorrhoids,  diverticulosis (same as in 2002 and random bxs negative then)  . EP IMPLANTABLE DEVICE N/A 02/26/2015   Procedure: PPM Generator Changeout;  Surgeon: [name], MD;  Location: [facility];  Service: Cardiovascular;  Laterality: N/A;  . MASTECTOMY  1982   Bilateral  . PACEMAKER INSERTION    . THYROIDECTOMY    . TONSILLECTOMY    . UPPER GASTROINTESTINAL ENDOSCOPY  09/06/2000   normal    Current Outpatient Prescriptions  Medication Sig Dispense Refill  . acetaminophen (TYLENOL ARTHRITIS PAIN) 650 MG CR tablet Take 1,300 mg by mouth 2 (two) times daily.      . diphenoxylate-atropine (LOMOTIL) 2.5-0.025 MG tablet Take 1 tablet by mouth 4 (four) times daily as needed for diarrhea or loose stools. 30 tablet 0  . furosemide (LASIX) 20 MG tablet Take by mouth daily.     . furosemide (LASIX) 40 MG tablet Take 20 mg by mouth daily.    Marland Kitchen levothyroxine (SYNTHROID, LEVOTHROID) 88 MCG tablet Take 1 tablet (88 mcg total) by mouth daily. 90 tablet 1  . ondansetron (ZOFRAN) 4 MG tablet Take 1 tablet (4 mg total) by mouth every 8 (eight) hours as needed for nausea (diarrhea). 60 tablet 1  . sertraline (ZOLOFT) 25 MG tablet Take 1 tablet (25 mg total) by mouth daily. 30 tablet 0  . telmisartan (MICARDIS) 20 MG tablet Take 1 tablet (20 mg total) by mouth daily. (Patient taking differently: Take 20 mg by mouth every evening. ) 90 tablet 3  .  warfarin (COUMADIN) 3 MG tablet USE AS DIRECTED BY COUMADIN CLINIC (Patient taking differently: Take 1.5- 3mg  by mouth once daily; take 1.5mg  on Sunday, Tuesday, and Thursday, and then take 3mg  on all other days) 30 tablet 3   No current facility-administered medications for this visit.     Allergies:   Patient has no known allergies.   Social History:  The patient  reports that she quit smoking about 62 years ago. She has never used smokeless tobacco. She reports that she does not drink alcohol or use drugs.   Family History:  The patient's family history  includes Arthritis in her other; Colon cancer (age of onset: 73) in her father; Heart disease in her father; Hypertension in her other; Stroke in her mother.  ROS:  Please see the history of present illness.  All other systems are reviewed and otherwise negative.   PHYSICAL EXAM: VS:  There were no vitals taken for this visit. BMI: There is no height or weight on file to calculate BMI. Well nourished, well developed, in no acute distress  HEENT: normocephalic, atraumatic  Neck: no JVD, carotid bruits or masses Cardiac:  *** RRR; no significant murmurs, no rubs, or gallops Lungs:  ***clear to auscultation bilaterally, no wheezing, rhonchi or rales  Abd: soft, nontender MS: no deformity, age appropriate atrophy Ext: ***no edema  Skin: warm and dry, no rash Neuro:  No gross deficits appreciated Psych: euthymic mood, full affect  PPM site is stable, no tethering or discomfort   EKG:  Done 12/03/15 is V paced PPM interrogation today and reviewed by myself: *** stable battery and lead status/measurements, *** sensor threshold was changed from medium/low to low to increase response to early activity given failrly flat HR histogram anad patient report of a pretty active lifestyle.  05/01/15: TTE Study Conclusions - Left ventricle: The cavity size was normal. Wall thickness was   normal. Systolic function was normal. The estimated ejection   fraction was in the range of 55% to 60%. Wall motion was normal;   there were no regional wall motion abnormalities. The study is   not technically sufficient to allow evaluation of LV diastolic   function. - Mitral valve: Calcified annulus. - Left atrium: The atrium was severely dilated. 77mm - Right ventricle: The cavity size was mildly dilated. - Right atrium: The atrium was severely dilated. - Tricuspid valve: There was severe regurgitation. - Pulmonary arteries: Systolic pressure was moderately increased.   PA peak pressure: 52 mm Hg (S). -  Pericardium, extracardiac: A small pericardial effusion was   identified. Impressions: - Normal LV function; severe biatrial enlargement; mild RVE; severe   TR with moderately elevated pulmonary pressure; small pericardial   effusion.  Recent Labs: 04/22/2015: Pro B Natriuretic peptide (BNP) 366.0 12/15/2015: TSH 0.85 02/09/2016: ALT 16; BUN 10; Creatinine, Ser 1.17; Hemoglobin 12.1; Platelets 259; Potassium 4.6; Sodium 130  No results found for requested labs within last 8760 hours.   CrCl cannot be calculated (Patient's most recent lab result is older than the maximum 21 days allowed.).   Wt Readings from Last 3 Encounters:  02/18/16 124 lb (56.2 kg)  02/09/16 127 lb 9.6 oz (57.9 kg)  02/03/16 130 lb (59 kg)     Other studies reviewed: Additional studies/records reviewed today include: summarized above  ASSESSMENT AND PLAN:  1. CHB/PPM     ***normal device function, no changes made  2. Permanent Afib     ***CHA2DS2Vasc is at least 3 on warfarin, monitored  and managed with the coumadin clinic  3. HFpEF     ***euvolemic by exam and weight  4. Dizziness, hx of near syncope, syncope felt to be dehydration      ***     She was previously  instructed to take 1/2 lasix daily and if she has any increased SOB to resume the whole tab.  On days that she had had diarrhea with her IBS, not to take any lasix.  Monitor her symptoms.   If no improvement or if any worsening to call and let me know.   Disposition: ***.  Current medicines are reviewed at length with the patient today.  The patient did not have any concerns regarding medicines.  Haywood Lasso, PA-C 03/07/2016 12:17 PM     [place] [address] [phone] (office)  [phone] (fax)

## 2016-03-08 ENCOUNTER — Other Ambulatory Visit: Payer: Self-pay | Admitting: Internal Medicine

## 2016-03-08 ENCOUNTER — Ambulatory Visit: Payer: Medicare Other | Admitting: Physician Assistant

## 2016-03-08 ENCOUNTER — Telehealth: Payer: Self-pay | Admitting: Physician Assistant

## 2016-03-08 ENCOUNTER — Telehealth: Payer: Self-pay | Admitting: *Deleted

## 2016-03-08 ENCOUNTER — Telehealth: Payer: Self-pay | Admitting: Internal Medicine

## 2016-03-08 DIAGNOSIS — I10 Essential (primary) hypertension: Secondary | ICD-10-CM

## 2016-03-08 DIAGNOSIS — I071 Rheumatic tricuspid insufficiency: Secondary | ICD-10-CM

## 2016-03-08 MED ORDER — IRBESARTAN 150 MG PO TABS: 150.0000 mg | ORAL_TABLET | Freq: Every day | ORAL | 1 refills | Status: DC

## 2016-03-08 NOTE — Telephone Encounter (Signed)
She reports two very large diarrhea episodes this afternoon.  She is very distressed about this and wants to know what else she can do.  She reports that she is losing weight and she wants to know what to do to stop the diarrhea?

## 2016-03-08 NOTE — Telephone Encounter (Signed)
New Message:   Pt has an appt today with Renee at 2:30/ Says she needs to talk to [facility] or you,she says she is so sick with diarrhea.

## 2016-03-08 NOTE — Telephone Encounter (Signed)
Rec'd call pt stat6es the Telmisartan is on a tier 4 plan, and is not covered. alternatice are Valsartan 160 mg, Losartan 50 mg, and Irbesartan 300 mg...lmb

## 2016-03-08 NOTE — Telephone Encounter (Signed)
SPOKE TO PT ABOUT SYMPTOMS AND WILL NOT BE ABLE TO ATTEND APPT  AND WILL CALL BACK AND MAKE APPT AFTER TALKING TO GI  PROVIDER.Marland Kitchen

## 2016-03-08 NOTE — Telephone Encounter (Signed)
See if she is willing to do a flex sig with enema only prep Could stay on her antiocag med for this and we would have a chance to see if something other than IBS going on  In some sense a colonoscopy is better but has greater risk and would want off warfarin so think this is a good option (flex sig)

## 2016-03-09 ENCOUNTER — Telehealth: Payer: Self-pay | Admitting: *Deleted

## 2016-03-09 NOTE — Telephone Encounter (Signed)
Spoke with pt and she states she is scheduled for Flexible Sigmoidscopy on Feb 6th and she does not need to hold coumadin Appt made for Feb 5th to check her INR and she states understanding

## 2016-03-09 NOTE — Telephone Encounter (Signed)
Notified pt MD change BP med to Irbesartan rx sent to [facility]...Alyssa [name]

## 2016-03-09 NOTE — Telephone Encounter (Signed)
Patient will come in and have pre-visit tomorrow and flex 03/16/16.  She understands to remain on coumadin.

## 2016-03-10 ENCOUNTER — Ambulatory Visit (AMBULATORY_SURGERY_CENTER): Payer: Self-pay

## 2016-03-10 VITALS — Ht 66.0 in | Wt 117.8 lb

## 2016-03-10 DIAGNOSIS — R197 Diarrhea, unspecified: Secondary | ICD-10-CM

## 2016-03-10 NOTE — Progress Notes (Signed)
No allergies to eggs or soy No past problems with anesthesia/airway No diet meds No home oxygen  Declined emmi

## 2016-03-15 ENCOUNTER — Ambulatory Visit (INDEPENDENT_AMBULATORY_CARE_PROVIDER_SITE_OTHER): Payer: Medicare Other | Admitting: *Deleted

## 2016-03-15 DIAGNOSIS — Z5181 Encounter for therapeutic drug level monitoring: Secondary | ICD-10-CM | POA: Diagnosis not present

## 2016-03-15 DIAGNOSIS — I482 Chronic atrial fibrillation: Secondary | ICD-10-CM | POA: Diagnosis not present

## 2016-03-15 DIAGNOSIS — I4821 Permanent atrial fibrillation: Secondary | ICD-10-CM

## 2016-03-15 LAB — POCT INR: INR: 2.4

## 2016-03-16 ENCOUNTER — Ambulatory Visit (AMBULATORY_SURGERY_CENTER): Payer: Medicare Other | Admitting: Internal Medicine

## 2016-03-16 ENCOUNTER — Encounter: Payer: Self-pay | Admitting: Internal Medicine

## 2016-03-16 VITALS — BP 105/48 | HR 60 | Temp 97.2°F | Resp 16 | Ht 66.0 in | Wt 117.0 lb

## 2016-03-16 DIAGNOSIS — R197 Diarrhea, unspecified: Secondary | ICD-10-CM

## 2016-03-16 DIAGNOSIS — I10 Essential (primary) hypertension: Secondary | ICD-10-CM | POA: Diagnosis not present

## 2016-03-16 DIAGNOSIS — K5731 Diverticulosis of large intestine without perforation or abscess with bleeding: Secondary | ICD-10-CM | POA: Diagnosis not present

## 2016-03-16 DIAGNOSIS — K589 Irritable bowel syndrome without diarrhea: Secondary | ICD-10-CM | POA: Diagnosis not present

## 2016-03-16 MED ORDER — SODIUM CHLORIDE 0.9 % IV SOLN: 500.0000 mL | INTRAVENOUS | Status: DC

## 2016-03-16 NOTE — Op Note (Signed)
[facility] Patient Name: Haydon Bragan Procedure Date: 03/16/2016 7:55 AM MRN: [id number] Endoscopist: Gatha Mayer , MD Age: 83 Referring MD:  Date of Birth: February 26, 1924 Gender: Female Account #: [id number] Procedure:                Flexible Sigmoidoscopy Indications:              Diarrhea Medicines:                Propofol per Anesthesia, Monitored Anesthesia Care Procedure:                Pre-Anesthesia Assessment:                           - Prior to the procedure, a History and Physical                            was performed, and patient medications and                            allergies were reviewed. The patient's tolerance of                            previous anesthesia was also reviewed. The risks                            and benefits of the procedure and the sedation                            options and risks were discussed with the patient.                            All questions were answered, and informed consent                            was obtained. Anticoagulants: The patient has taken                            Coumadin (warfarin). It was decided not to withhold                            this medication prior to procedure. ASA Grade                            Assessment: III - A patient with severe systemic                            disease. After reviewing the risks and benefits,                            the patient was deemed in satisfactory condition to                            undergo the procedure.  After obtaining informed consent, the scope was                            passed under direct vision. The Model PCF-H190DL                            [id number]) scope was introduced through the anus                            and advanced to the the descending colon. The                            flexible sigmoidoscopy was accomplished without                            difficulty. The patient tolerated the  procedure                            well. The quality of the bowel preparation was fair. Scope In: Scope Out: Findings:                 The perianal exam findings include hemorrhoids.                           The digital rectal exam findings include decreased                            sphincter tone.                           Many small and large-mouthed diverticula were found                            in the sigmoid colon and descending colon. There                            was no evidence of diverticular bleeding.                           Internal hemorrhoids were found during retroflexion.                           The exam was otherwise without abnormality.                           Biopsies for histology were taken with a cold                            forceps from the left colon for evaluation of                            microscopic colitis. Complications:            No immediate complications. Estimated Blood Loss:     Estimated blood loss was minimal. Impression:               -  Preparation of the colon was fair.                           - Hemorrhoids found on perianal exam.                           - Decreased sphincter tone found on digital rectal                            exam.                           - Severe diverticulosis in the sigmoid colon and in                            the descending colon. There was no evidence of                            diverticular bleeding.                           - Internal hemorrhoids.                           - The examination was otherwise normal.                           - Biopsies were taken with a cold forceps from the                            left colon for evaluation of microscopic colitis.                           - Suspect this is indded IBS given amount of solid                            stool seen (did have enema prep).                           Have been using anti-diarrheal approach but will                             try fiber instead and await bxs. Recommendation:           - Use Benefiber one tablespoon PO daily.                           - Await pathology results.                           - Patient has a contact number available for                            emergencies. The signs and symptoms of potential  delayed complications were discussed with the                            patient. Return to normal activities tomorrow.                            Written discharge instructions were provided to the                            patient.                           - Resume previous diet. Gatha Mayer, MD 03/16/2016 8:27:30 AM This report has been signed electronically.

## 2016-03-16 NOTE — Progress Notes (Signed)
Report given to PACU, vss 

## 2016-03-16 NOTE — Progress Notes (Signed)
No change in hx since PV.  No hx of sleep apnea of home oxygen use

## 2016-03-16 NOTE — Progress Notes (Signed)
Called to room to assist during endoscopic procedure.  Patient ID and intended procedure confirmed with present staff. Received instructions for my participation in the procedure from the performing physician.  

## 2016-03-16 NOTE — Patient Instructions (Addendum)
Nothing bad here. You do have diverticulosis. I took some biopsies to check for microscopic colitis but doubt that is present.  I am starting to think you may need more fiber and not anti-diarrheals.  I will let you know more but at this point let's try you taking a tablespoon of Benefiber each day.  I appreciate the opportunity to care for you. Gatha Mayer, MD, [facility]  Impression/Recommendations:  Hemorrhoid handout given to patient. Diverticulosis handout given to patient.  Use Benefiber one Tablespoon by mouthg daily. YOU HAD AN ENDOSCOPIC PROCEDURE TODAY AT [facility]:   Refer to the procedure report that was given to you for any specific questions about what was found during the examination.  If the procedure report does not answer your questions, please call your gastroenterologist to clarify.  If you requested that your care partner not be given the details of your procedure findings, then the procedure report has been included in a sealed envelope for you to review at your convenience later.  YOU SHOULD EXPECT: Some feelings of bloating in the abdomen. Passage of more gas than usual.  Walking can help get rid of the air that was put into your GI tract during the procedure and reduce the bloating. If you had a lower endoscopy (such as a colonoscopy or flexible sigmoidoscopy) you may notice spotting of blood in your stool or on the toilet paper. If you underwent a bowel prep for your procedure, you may not have a normal bowel movement for a few days.  Please Note:  You might notice some irritation and congestion in your nose or some drainage.  This is from the oxygen used during your procedure.  There is no need for concern and it should clear up in a day or so.  SYMPTOMS TO REPORT IMMEDIATELY:   Following lower endoscopy (colonoscopy or flexible sigmoidoscopy):  Excessive amounts of blood in the stool  Significant tenderness or worsening of abdominal  pains  Swelling of the abdomen that is new, acute  Fever of 100F or higher For urgent or emergent issues, a gastroenterologist can be reached at any hour by calling [phone].   DIET:  We do recommend a small meal at first, but then you may proceed to your regular diet.  Drink plenty of fluids but you should avoid alcoholic beverages for 24 hours.  ACTIVITY:  You should plan to take it easy for the rest of today and you should NOT DRIVE or use heavy machinery until tomorrow (because of the sedation medicines used during the test).    FOLLOW UP: Our staff will call the number listed on your records the next business day following your procedure to check on you and address any questions or concerns that you may have regarding the information given to you following your procedure. If we do not reach you, we will leave a message.  However, if you are feeling well and you are not experiencing any problems, there is no need to return our call.  We will assume that you have returned to your regular daily activities without incident.  If any biopsies were taken you will be contacted by phone or by letter within the next 1-3 weeks.  Please call us at [phone] if you have not heard about the biopsies in 3 weeks.    SIGNATURES/CONFIDENTIALITY: You and/or your care partner have signed paperwork which will be entered into your electronic medical record.  These signatures attest to  the fact that that the information above on your After Visit Summary has been reviewed and is understood.  Full responsibility of the confidentiality of this discharge information lies with you and/or your care-partner.

## 2016-03-17 ENCOUNTER — Telehealth: Payer: Self-pay

## 2016-03-17 NOTE — Telephone Encounter (Signed)
  Follow up Call-  Call back number 03/16/2016  Post procedure Call Back phone  # [phone]  Permission to leave phone message Yes  Some recent data might be hidden     Patient questions:  Do you have a fever, pain , or abdominal swelling? No. Pain Score  0 *  Have you tolerated food without any problems? Yes.    Have you been able to return to your normal activities? Yes.    Do you have any questions about your discharge instructions: Diet   No. Medications  No. Follow up visit  No.  Do you have questions or concerns about your Care? No.  Actions: * If pain score is 4 or above: No action needed, pain <4.  Pt reported she did have some dizziness yesterday, but she said she felt fine this am. maw

## 2016-03-26 NOTE — Progress Notes (Signed)
bxs normal I called and she is better She will f/u prn

## 2016-04-02 ENCOUNTER — Telehealth: Payer: Self-pay | Admitting: Internal Medicine

## 2016-04-02 NOTE — Telephone Encounter (Signed)
Patient notified that she can take the imodium or the lomotil.  She will call back for any additional questions or concerns.

## 2016-04-05 ENCOUNTER — Ambulatory Visit (INDEPENDENT_AMBULATORY_CARE_PROVIDER_SITE_OTHER): Payer: Medicare Other | Admitting: *Deleted

## 2016-04-05 DIAGNOSIS — Z5181 Encounter for therapeutic drug level monitoring: Secondary | ICD-10-CM

## 2016-04-05 DIAGNOSIS — I4821 Permanent atrial fibrillation: Secondary | ICD-10-CM

## 2016-04-05 DIAGNOSIS — I482 Chronic atrial fibrillation: Secondary | ICD-10-CM | POA: Diagnosis not present

## 2016-04-05 LAB — POCT INR: INR: 1.7

## 2016-04-15 ENCOUNTER — Encounter: Payer: Self-pay | Admitting: Internal Medicine

## 2016-04-15 ENCOUNTER — Ambulatory Visit (INDEPENDENT_AMBULATORY_CARE_PROVIDER_SITE_OTHER): Payer: Medicare Other | Admitting: Internal Medicine

## 2016-04-15 ENCOUNTER — Other Ambulatory Visit (INDEPENDENT_AMBULATORY_CARE_PROVIDER_SITE_OTHER): Payer: Medicare Other

## 2016-04-15 VITALS — BP 118/68 | HR 96 | Temp 97.7°F | Resp 16 | Ht 66.75 in | Wt 120.0 lb

## 2016-04-15 DIAGNOSIS — I10 Essential (primary) hypertension: Secondary | ICD-10-CM | POA: Diagnosis not present

## 2016-04-15 DIAGNOSIS — N183 Chronic kidney disease, stage 3 unspecified: Secondary | ICD-10-CM

## 2016-04-15 DIAGNOSIS — E039 Hypothyroidism, unspecified: Secondary | ICD-10-CM | POA: Diagnosis not present

## 2016-04-15 DIAGNOSIS — R222 Localized swelling, mass and lump, trunk: Secondary | ICD-10-CM | POA: Diagnosis not present

## 2016-04-15 LAB — URINALYSIS, ROUTINE W REFLEX MICROSCOPIC
BILIRUBIN URINE: NEGATIVE
KETONES UR: NEGATIVE
NITRITE: NEGATIVE
Specific Gravity, Urine: 1.015 (ref 1.000–1.030)
TOTAL PROTEIN, URINE-UPE24: NEGATIVE
UROBILINOGEN UA: 0.2 (ref 0.0–1.0)
Urine Glucose: NEGATIVE
pH: 5.5 (ref 5.0–8.0)

## 2016-04-15 LAB — BASIC METABOLIC PANEL
BUN: 21 mg/dL (ref 6–23)
CALCIUM: 9 mg/dL (ref 8.4–10.5)
CO2: 31 meq/L (ref 19–32)
CREATININE: 1.14 mg/dL (ref 0.40–1.20)
Chloride: 104 mEq/L (ref 96–112)
GFR: 47.6 mL/min — ABNORMAL LOW (ref >60.00)
GLUCOSE: 77 mg/dL (ref 70–99)
Potassium: 4.7 mEq/L (ref 3.5–5.1)
SODIUM: 140 meq/L (ref 135–145)

## 2016-04-15 LAB — TSH: TSH: 0.38 u[IU]/mL (ref 0.35–4.50)

## 2016-04-15 NOTE — Patient Instructions (Signed)
Hypothyroidism Hypothyroidism is a disorder of the thyroid. The thyroid is a large gland that is located in the lower front of the neck. The thyroid releases hormones that control how the body works. With hypothyroidism, the thyroid does not make enough of these hormones. What are the causes? Causes of hypothyroidism may include:  Viral infections.  Pregnancy.  Your own defense system (immune system) attacking your thyroid.  Certain medicines.  Birth defects.  Past radiation treatments to your head or neck.  Past treatment with radioactive iodine.  Past surgical removal of part or all of your thyroid.  Problems with the gland that is located in the center of your brain (pituitary).  What are the signs or symptoms? Signs and symptoms of hypothyroidism may include:  Feeling as though you have no energy (lethargy).  Inability to tolerate cold.  Weight gain that is not explained by a change in diet or exercise habits.  Dry skin.  Coarse hair.  Menstrual irregularity.  Slowing of thought processes.  Constipation.  Sadness or depression.  How is this diagnosed? Your health care provider may diagnose hypothyroidism with blood tests and ultrasound tests. How is this treated? Hypothyroidism is treated with medicine that replaces the hormones that your body does not make. After you begin treatment, it may take several weeks for symptoms to go away. Follow these instructions at home:  Take medicines only as directed by your health care provider.  If you start taking any new medicines, tell your health care provider.  Keep all follow-up visits as directed by your health care provider. This is important. As your condition improves, your dosage needs may change. You will need to have blood tests regularly so that your health care provider can watch your condition. Contact a health care provider if:  Your symptoms do not get better with treatment.  You are taking thyroid  replacement medicine and: ? You sweat excessively. ? You have tremors. ? You feel anxious. ? You lose weight rapidly. ? You cannot tolerate heat. ? You have emotional swings. ? You have diarrhea. ? You feel weak. Get help right away if:  You develop chest pain.  You develop an irregular heartbeat.  You develop a rapid heartbeat. This information is not intended to replace advice given to you by your health care provider. Make sure you discuss any questions you have with your health care provider. Document Released: 01/25/2005 Document Revised: 07/03/2015 Document Reviewed: 06/12/2013 Elsevier Interactive Patient Education  2017 [employer]vier Inc.  

## 2016-04-15 NOTE — Progress Notes (Signed)
Pre visit review using our clinic review tool, if applicable. No additional management support is needed unless otherwise documented below in the visit note. 

## 2016-04-15 NOTE — Progress Notes (Signed)
Subjective:  Patient ID: Alyssa [name], female    DOB: August 07, 1924  Age: 83 y.o. MRN: [id number]  CC: Hypothyroidism and Hypertension   HPI Alyssa [name] presents for Follow-up on hypertension and hypothyroidism. She complains of a painful and enlarging bump on the left side of her lower sternum. She told me about this 3-4 months ago and a plain film was normal but she complains that it is enlarging. The skin over the area and her breast are both normal.  Outpatient Medications Prior to Visit  Medication Sig Dispense Refill  . acetaminophen (TYLENOL ARTHRITIS PAIN) 650 MG CR tablet Take 1,300 mg by mouth 2 (two) times daily.      . diphenoxylate-atropine (LOMOTIL) 2.5-0.025 MG tablet Take 1 tablet by mouth 4 (four) times daily as needed for diarrhea or loose stools. 30 tablet 0  . furosemide (LASIX) 40 MG tablet Take 20 mg by mouth daily.    . irbesartan (AVAPRO) 150 MG tablet Take 1 tablet (150 mg total) by mouth daily. 90 tablet 1  . levothyroxine (SYNTHROID, LEVOTHROID) 88 MCG tablet Take 1 tablet (88 mcg total) by mouth daily. 90 tablet 1  . OVER THE COUNTER MEDICATION Potassium prn    . telmisartan (MICARDIS) 20 MG tablet daily.  2  . warfarin (COUMADIN) 3 MG tablet USE AS DIRECTED BY COUMADIN CLINIC (Patient taking differently: Take 1.5- 3mg  by mouth once daily; take 1.5mg  on Sunday, Tuesday, and Thursday, and then take 3mg  on all other days) 30 tablet 3  . 0.9 %  sodium chloride infusion      No facility-administered medications prior to visit.     ROS Review of Systems  Constitutional: Negative for chills, diaphoresis, fatigue and fever.  HENT: Negative.  Negative for sinus pressure and trouble swallowing.   Eyes: Negative for visual disturbance.  Respiratory: Negative for cough, chest tightness, shortness of breath and wheezing.   Cardiovascular: Positive for chest pain. Negative for palpitations and leg swelling.  Gastrointestinal: Negative for abdominal pain, constipation,  diarrhea, nausea and vomiting.  Endocrine: Negative for cold intolerance and heat intolerance.  Genitourinary: Negative.  Negative for decreased urine volume, difficulty urinating and dysuria.  Musculoskeletal: Negative.  Negative for back pain, myalgias and neck pain.  Skin: Negative.  Negative for color change and rash.  Neurological: Negative.   Hematological: Negative for adenopathy. Does not bruise/bleed easily.  Psychiatric/Behavioral: Negative.     Objective:  BP 118/68 (BP Location: Left Arm, Patient Position: Sitting, Cuff Size: Normal)   Pulse 96   Temp 97.7 F (36.5 C) (Oral)   Resp 16   Ht 5' 6.75" (1.695 m)   Wt 120 lb (54.4 kg)   SpO2 96%   BMI 18.94 kg/m   BP Readings from Last 3 Encounters:  04/15/16 118/68  03/16/16 (!) 105/48  02/18/16 116/78    Wt Readings from Last 3 Encounters:  04/15/16 120 lb (54.4 kg)  03/16/16 117 lb (53.1 kg)  03/10/16 117 lb 12.8 oz (53.4 kg)    Physical Exam  Constitutional: She is oriented to person, place, and time. No distress.  HENT:  Mouth/Throat: Oropharynx is clear and moist. No oropharyngeal exudate.  Eyes: Conjunctivae are normal. Right eye exhibits no discharge. Left eye exhibits no discharge. No scleral icterus.  Neck: Normal range of motion. Neck supple. No JVD present. No tracheal deviation present. No thyromegaly present.  Cardiovascular: Normal rate, regular rhythm, normal heart sounds and intact distal pulses.  Exam reveals no gallop and  no friction rub.   No murmur heard. Pulmonary/Chest: Effort normal and breath sounds normal. No stridor. No respiratory distress. She has no wheezes. She has no rales. She exhibits mass. She exhibits no tenderness, no edema, no deformity, no swelling and no retraction. Right breast exhibits no inverted nipple, no mass, no nipple discharge, no skin change and no tenderness. Left breast exhibits no inverted nipple, no mass, no nipple discharge, no skin change and no tenderness.  Breasts are symmetrical.    Abdominal: Soft. Bowel sounds are normal. She exhibits no distension. There is no tenderness. There is no rebound and no guarding.  Musculoskeletal: Normal range of motion. She exhibits no edema, tenderness or deformity.  Lymphadenopathy:    She has no cervical adenopathy.  Neurological: She is oriented to person, place, and time.  Skin: Skin is warm and dry. No rash noted. She is not diaphoretic. No erythema. No pallor.  Vitals reviewed.   Lab Results  Component Value Date   WBC 3.7 (L) 02/09/2016   HGB 12.1 02/09/2016   HCT 35.7 (L) 02/09/2016   PLT 259 02/09/2016   GLUCOSE 77 04/15/2016   CHOL 155 02/11/2014   TRIG 84.0 02/11/2014   HDL 46.10 02/11/2014   LDLCALC 92 02/11/2014   ALT 16 02/09/2016   AST 23 02/09/2016   NA 140 04/15/2016   K 4.7 04/15/2016   CL 104 04/15/2016   CREATININE 1.14 04/15/2016   BUN 21 04/15/2016   CO2 31 04/15/2016   TSH 0.38 04/15/2016   INR 1.7 04/05/2016   HGBA1C 5.9 06/09/2012    No results found.  Assessment & Plan:   Alyssa [name] was seen today for hypothyroidism and hypertension.  Diagnoses and all orders for this visit:  Mass of chest wall, left- I've ordered a CT scan with contrast to see if there is a suspicious lesion in the rib cage or in the subcutaneous area that needs to be removed or biopsied. -     CT CHEST W CONTRAST; Future  Essential hypertension, benign- her blood pressure is well-controlled, electrolytes and renal function are stable. -     Basic metabolic panel; Future -     Urinalysis, Routine w reflex microscopic; Future  Acquired hypothyroidism- her TSH is in the normal range, she will remain on the current dose of levothyroxine. -     TSH; Future  Chronic renal insufficiency, stage III (moderate)- her renal function is stable, she will avoid nephrotoxic agents. -     Basic metabolic panel; Future -     Urinalysis, Routine w reflex microscopic; Future   I am having Alyssa [name] maintain  her acetaminophen, warfarin, levothyroxine, furosemide, diphenoxylate-atropine, irbesartan, OVER THE COUNTER MEDICATION, and telmisartan. We will stop administering sodium chloride.  No orders of the defined types were placed in this encounter.    Follow-up: Return in about 4 months (around 08/15/2016).  [name], MD

## 2016-04-17 ENCOUNTER — Encounter: Payer: Self-pay | Admitting: Internal Medicine

## 2016-04-20 ENCOUNTER — Ambulatory Visit (INDEPENDENT_AMBULATORY_CARE_PROVIDER_SITE_OTHER): Payer: Medicare Other | Admitting: Physician Assistant

## 2016-04-20 ENCOUNTER — Ambulatory Visit (INDEPENDENT_AMBULATORY_CARE_PROVIDER_SITE_OTHER): Payer: Medicare Other

## 2016-04-20 VITALS — BP 138/80 | HR 83 | Ht 66.75 in | Wt 119.0 lb

## 2016-04-20 DIAGNOSIS — I482 Chronic atrial fibrillation: Secondary | ICD-10-CM | POA: Diagnosis not present

## 2016-04-20 DIAGNOSIS — Z5181 Encounter for therapeutic drug level monitoring: Secondary | ICD-10-CM

## 2016-04-20 DIAGNOSIS — Z950 Presence of cardiac pacemaker: Secondary | ICD-10-CM

## 2016-04-20 DIAGNOSIS — I4821 Permanent atrial fibrillation: Secondary | ICD-10-CM

## 2016-04-20 DIAGNOSIS — I442 Atrioventricular block, complete: Secondary | ICD-10-CM | POA: Diagnosis not present

## 2016-04-20 LAB — POCT INR: INR: 1.7

## 2016-04-20 NOTE — Patient Instructions (Signed)
Medication Instructions:   Your physician recommends that you continue on your current medications as directed. Please refer to the Current Medication list given to you today.   If you need a refill on your cardiac medications before your next appointment, please call your pharmacy.  Labwork: NONE ORDERED  TODAY    Testing/Procedures: NONE ORDERED  TODAY    Follow-Up:  Your physician wants you to follow-up in: [place] will receive a reminder letter in the mail two months in advance. If you don't receive a letter, please call our office to schedule the follow-up appointment.  Remote monitoring is used to monitor your Pacemaker of ICD from home. This monitoring reduces the number of office visits required to check your device to one time per year. It allows [place] to keep an eye on the functioning of your device to ensure it is working properly. You are scheduled for a device check from home on 05-24-2016.  You may send your transmission at any time that day. If you have a wireless device, the transmission will be sent automatically. After your physician reviews your transmission, you will receive a postcard with your next transmission date.      Any Other Special Instructions Will Be Listed Below (If Applicable).                                                                                                                                                 \

## 2016-04-20 NOTE — Progress Notes (Signed)
Cardiology Office Note Date:  04/20/2016  Patient ID:  Alyssa, [name] 1924-12-01, MRN [id number] PCP:  [name], MD  Electrophysiologist: Dr. Caryl Comes   Chief Complaint: planned 3 month f/u  History of Present Illness: Alyssa [name] is a 83 y.o. female with history of CHB w/PPM, permanent AFib, hypothyroidism, IBS, GERD, LE edema/DOE that waxes and wanes, at her last visit with Dr. Caryl Comes, he mentioned historically a syncopal episode after prolonged standing and in the heat. Because of that somebody thought she might have been dehydrated and recommended that her diuretics be reduced. This was followed by increasing symptoms of shortness of breath and nocturnal dyspnea. She called and we suggested she increase her furosemide back to daily from every other day. With this her symptoms have largely resolved. She has some ongoing problems with dyspnea particularly at the end of the day and recommended at that visit she take her lasix BID alternating days with daily only.  She was in the ER 12/04/15 with an episode of dizziness/near syncope that occurred upon standing and SOB, orthostatic vitals were reported as negative, CXR negative, BMET/CBC stable, and pacer check with normal function and discharged from the ER to keep her visit in the office today.  She was seen by myself afterwards and and in discussion felt likely secondary to being a little dry with episodes of diarrhea prior to the symptoms.  She comes back today as a planned visit, feeling well.  Unfortunately her IBS continued to bother her for a few months though has made a trend to better of late.  She has not had any recurrent symptms of dizziness, no near syncope or syncope.  she denies any CP, palpitations or SOB.  She thinks she may be coming down with  Cold having a slight cough/sinus congestion.  Not feeling poorly though, no symptoms of illness otherwise, no fever/chills.     She is having a chest wall mass evaluated vis her PMD,  describes a firm, non-mobil "lump" just left of her sternum.   DEVICE information: MDT dual chamber PPM, implanted 10/30/1998 with gen change 02/26/15, Dr. Caryl Comes, [place] today   Past Medical History:  Diagnosis Date  . Atrial fibrillation ([place])    pacemaker, chronic anticoag  . Diverticulosis   . Fibroma    inner lips/mouth  . GERD (gastroesophageal reflux disease)   . Hx of breast cancer   . Hyperkalemia   . Hypothyroidism   . IBS (irritable bowel syndrome)   . Internal hemorrhoids   . Left inguinal hernia    direct  . MVP (mitral valve prolapse)   . Osteoporosis   . Renal insufficiency   . Thyroid cancer [facility])     Past Surgical History:  Procedure Laterality Date  . COLONOSCOPY  10/28/2005   internal hemorrhoids, diverticulosis (same as in 2002 and random bxs negative then)  . EP IMPLANTABLE DEVICE N/A 02/26/2015   Procedure: PPM Generator Changeout;  Surgeon: [name], MD;  Location: [facility];  Service: Cardiovascular;  Laterality: N/A;  . MASTECTOMY  1982   Bilateral  . PACEMAKER INSERTION    . THYROIDECTOMY    . TONSILLECTOMY    . UPPER GASTROINTESTINAL ENDOSCOPY  09/06/2000   normal    Current Outpatient Prescriptions  Medication Sig Dispense Refill  . acetaminophen (TYLENOL ARTHRITIS PAIN) 650 MG CR tablet Take 1,300 mg by mouth 2 (two) times daily.      . diphenoxylate-atropine (LOMOTIL) 2.5-0.025 MG  tablet Take 1 tablet by mouth 4 (four) times daily as needed for diarrhea or loose stools. 30 tablet 0  . furosemide (LASIX) 40 MG tablet Take 20 mg by mouth daily.    . irbesartan (AVAPRO) 150 MG tablet Take 1 tablet (150 mg total) by mouth daily. 90 tablet 1  . levothyroxine (SYNTHROID, LEVOTHROID) 88 MCG tablet Take 1 tablet (88 mcg total) by mouth daily. 90 tablet 1  . OVER THE COUNTER MEDICATION Potassium prn    . warfarin (COUMADIN) 3 MG tablet USE AS DIRECTED BY COUMADIN CLINIC (Patient taking differently: Take 1.5- 3mg  by mouth  once daily; take 1.5mg  on Sunday, Tuesday, and Thursday, and then take 3mg  on all other days) 30 tablet 3   No current facility-administered medications for this visit.     Allergies:   Patient has no known allergies.   Social History:  The patient  reports that she quit smoking about 62 years ago. She has never used smokeless tobacco. She reports that she does not drink alcohol or use drugs.   Family History:  The patient's family history includes Arthritis in her other; Colon cancer (age of onset: 5) in her father; Heart disease in her father; Hypertension in her other; Stroke in her mother.  ROS:  Please see the history of present illness.  All other systems are reviewed and otherwise negative.   PHYSICAL EXAM: VS:  BP 138/80   Pulse 83   Ht 5' 6.75" (1.695 m)   Wt 119 lb (54 kg)   BMI 18.78 kg/m  BMI: Body mass index is 18.78 kg/m. Well nourished, well developed, in no acute distress  HEENT: normocephalic, atraumatic  Neck: no JVD, carotid bruits or masses Cardiac:  RRR; no significant murmurs, no rubs, or gallops Lungs:  CTA b/l, no wheezing, rhonchi or rales  Abd: soft, nontender MS: no deformity, age appropriate atrophy Ext: wearing support stockings, no edema  Skin: warm and dry, no rash Neuro:  No gross deficits appreciated Psych: euthymic mood, full affect  PPM site is stable, no tethering or discomfort   EKG:  Done 12/03/15 is V paced PPM interrogation today by industry and reviewed by myself: stable battery and lead status/measurements.  There was one high threshold alert noted by MDT representative, lead testing was good today, lead history has been stable, in discussion, suspect was secondary to likely competitive rate/ectopy at the time, noting she does have some HR up to 120bpm by histograms, she is dependent today. 05/01/15: TTE Study Conclusions - Left ventricle: The cavity size was normal. Wall thickness was   normal. Systolic function was normal. The  estimated ejection   fraction was in the range of 55% to 60%. Wall motion was normal;   there were no regional wall motion abnormalities. The study is   not technically sufficient to allow evaluation of LV diastolic   function. - Mitral valve: Calcified annulus. - Left atrium: The atrium was severely dilated. 52mm - Right ventricle: The cavity size was mildly dilated. - Right atrium: The atrium was severely dilated. - Tricuspid valve: There was severe regurgitation. - Pulmonary arteries: Systolic pressure was moderately increased.   PA peak pressure: 52 mm Hg (S). - Pericardium, extracardiac: A small pericardial effusion was   identified. Impressions: - Normal LV function; severe biatrial enlargement; mild RVE; severe   TR with moderately elevated pulmonary pressure; small pericardial   effusion.  Recent Labs: 04/22/2015: Pro B Natriuretic peptide (BNP) 366.0 02/09/2016: ALT 16; Hemoglobin 12.1;  Platelets 259 04/15/2016: BUN 21; Creatinine, Ser 1.14; Potassium 4.7; Sodium 140; TSH 0.38  No results found for requested labs within last 8760 hours.   Estimated Creatinine Clearance: 28.5 mL/min (by C-G formula based on SCr of 1.14 mg/dL).   Wt Readings from Last 3 Encounters:  04/20/16 119 lb (54 kg)  04/15/16 120 lb (54.4 kg)  03/16/16 117 lb (53.1 kg)     Other studies reviewed: Additional studies/records reviewed today include: summarized above  ASSESSMENT AND PLAN:  1. CHB/PPM     normal device function     Given the high threshold alert with normal/stable device testing today, will have her do a remote in 1 month  2. Permanent Afib     CHA2DS2Vasc is at least 3 on warfarin, monitored and managed with the coumadin clinic     02/09/16 H/H 12/35, stable  3. HFpEF     euvolemic by exam and weight     02/09/16 K+ 4.6, BUN/Creat 10/1.17  4. Dizziness, near syncope     Not a recurrent complaint  Disposition: Remote pacer check in 1 month, then Q 3 months as usual, otherwise will  see her back in 1 year, sooner if needed.  Current medicines are reviewed at length with the patient today.  The patient did not have any concerns regarding medicines.  Haywood Lasso, PA-C 04/20/2016 3:19 PM     [address] [place] [address] [phone] (office)  [phone] (fax)

## 2016-04-21 ENCOUNTER — Ambulatory Visit: Payer: Medicare Other | Admitting: Physician Assistant

## 2016-04-23 ENCOUNTER — Inpatient Hospital Stay: Admission: RE | Admit: 2016-04-23 | Payer: Medicare Other | Source: Ambulatory Visit

## 2016-04-25 DIAGNOSIS — R05 Cough: Secondary | ICD-10-CM | POA: Diagnosis not present

## 2016-04-25 DIAGNOSIS — J22 Unspecified acute lower respiratory infection: Secondary | ICD-10-CM | POA: Diagnosis not present

## 2016-04-25 DIAGNOSIS — R062 Wheezing: Secondary | ICD-10-CM | POA: Diagnosis not present

## 2016-04-26 ENCOUNTER — Telehealth: Payer: Self-pay | Admitting: Internal Medicine

## 2016-04-26 ENCOUNTER — Other Ambulatory Visit: Payer: Self-pay | Admitting: Internal Medicine

## 2016-04-26 NOTE — Telephone Encounter (Signed)
Patient notified that she should follow the instructions of the prescribing provider

## 2016-05-04 ENCOUNTER — Other Ambulatory Visit: Payer: Self-pay | Admitting: Internal Medicine

## 2016-05-04 ENCOUNTER — Encounter: Payer: Self-pay | Admitting: Internal Medicine

## 2016-05-04 ENCOUNTER — Ambulatory Visit (INDEPENDENT_AMBULATORY_CARE_PROVIDER_SITE_OTHER): Payer: Medicare Other | Admitting: *Deleted

## 2016-05-04 ENCOUNTER — Ambulatory Visit (INDEPENDENT_AMBULATORY_CARE_PROVIDER_SITE_OTHER)
Admission: RE | Admit: 2016-05-04 | Discharge: 2016-05-04 | Disposition: A | Discharge status: Home / Self Care | Payer: Medicare Other | Source: Ambulatory Visit | Attending: Internal Medicine | Admitting: Internal Medicine

## 2016-05-04 DIAGNOSIS — I482 Chronic atrial fibrillation: Secondary | ICD-10-CM

## 2016-05-04 DIAGNOSIS — I4821 Permanent atrial fibrillation: Secondary | ICD-10-CM

## 2016-05-04 DIAGNOSIS — R222 Localized swelling, mass and lump, trunk: Secondary | ICD-10-CM | POA: Diagnosis not present

## 2016-05-04 DIAGNOSIS — N6320 Unspecified lump in the left breast, unspecified quadrant: Secondary | ICD-10-CM | POA: Insufficient documentation

## 2016-05-04 DIAGNOSIS — Z5181 Encounter for therapeutic drug level monitoring: Secondary | ICD-10-CM | POA: Diagnosis not present

## 2016-05-04 DIAGNOSIS — J984 Other disorders of lung: Secondary | ICD-10-CM | POA: Diagnosis not present

## 2016-05-04 LAB — POCT INR: INR: 2

## 2016-05-04 MED ORDER — IOPAMIDOL (ISOVUE-300) INJECTION 61%
80.0000 mL | Freq: Once | INTRAVENOUS | Status: AC | PRN
  Administered 2016-05-04: 80 mL via INTRAVENOUS

## 2016-05-05 ENCOUNTER — Other Ambulatory Visit: Payer: Self-pay | Admitting: Internal Medicine

## 2016-05-05 DIAGNOSIS — N6320 Unspecified lump in the left breast, unspecified quadrant: Secondary | ICD-10-CM

## 2016-05-07 ENCOUNTER — Other Ambulatory Visit: Payer: Self-pay | Admitting: Internal Medicine

## 2016-05-07 DIAGNOSIS — Z853 Personal history of malignant neoplasm of breast: Secondary | ICD-10-CM

## 2016-05-07 DIAGNOSIS — N6320 Unspecified lump in the left breast, unspecified quadrant: Secondary | ICD-10-CM

## 2016-05-11 ENCOUNTER — Ambulatory Visit
Admission: RE | Admit: 2016-05-11 | Discharge: 2016-05-11 | Disposition: A | Discharge status: Home / Self Care | Payer: Medicare Other | Source: Ambulatory Visit | Attending: Internal Medicine | Admitting: Internal Medicine

## 2016-05-11 DIAGNOSIS — Z853 Personal history of malignant neoplasm of breast: Secondary | ICD-10-CM

## 2016-05-11 DIAGNOSIS — N6489 Other specified disorders of breast: Secondary | ICD-10-CM | POA: Diagnosis not present

## 2016-05-11 DIAGNOSIS — N6320 Unspecified lump in the left breast, unspecified quadrant: Secondary | ICD-10-CM

## 2016-05-11 HISTORY — DX: Malignant neoplasm of unspecified site of unspecified female breast: C50.919

## 2016-05-12 DIAGNOSIS — L570 Actinic keratosis: Secondary | ICD-10-CM | POA: Diagnosis not present

## 2016-05-12 DIAGNOSIS — Z85828 Personal history of other malignant neoplasm of skin: Secondary | ICD-10-CM | POA: Diagnosis not present

## 2016-05-12 DIAGNOSIS — L814 Other melanin hyperpigmentation: Secondary | ICD-10-CM | POA: Diagnosis not present

## 2016-05-12 DIAGNOSIS — L720 Epidermal cyst: Secondary | ICD-10-CM | POA: Diagnosis not present

## 2016-05-12 DIAGNOSIS — L821 Other seborrheic keratosis: Secondary | ICD-10-CM | POA: Diagnosis not present

## 2016-05-24 ENCOUNTER — Telehealth: Payer: Self-pay | Admitting: Cardiology

## 2016-05-24 ENCOUNTER — Ambulatory Visit (INDEPENDENT_AMBULATORY_CARE_PROVIDER_SITE_OTHER): Payer: Medicare Other | Admitting: *Deleted

## 2016-05-24 DIAGNOSIS — I442 Atrioventricular block, complete: Secondary | ICD-10-CM

## 2016-05-24 NOTE — Progress Notes (Signed)
Remote pacemaker transmission.   

## 2016-05-24 NOTE — Telephone Encounter (Signed)
Spoke with pt and reminded pt of remote transmission that is due today. Pt verbalized understanding.   

## 2016-05-25 ENCOUNTER — Ambulatory Visit (INDEPENDENT_AMBULATORY_CARE_PROVIDER_SITE_OTHER): Payer: Medicare Other | Admitting: Pharmacist

## 2016-05-25 DIAGNOSIS — I482 Chronic atrial fibrillation: Secondary | ICD-10-CM | POA: Diagnosis not present

## 2016-05-25 DIAGNOSIS — I4821 Permanent atrial fibrillation: Secondary | ICD-10-CM

## 2016-05-25 DIAGNOSIS — Z5181 Encounter for therapeutic drug level monitoring: Secondary | ICD-10-CM | POA: Diagnosis not present

## 2016-05-25 LAB — CUP PACEART REMOTE DEVICE CHECK
Battery Remaining Longevity: 120 mo
Date Time Interrogation Session: 20180416160931
Implantable Lead Implant Date: 20000921
Implantable Pulse Generator Implant Date: 20170118
Lead Channel Impedance Value: 565 Ohm
Lead Channel Impedance Value: 67 Ohm
Lead Channel Pacing Threshold Amplitude: 0.875 V
Lead Channel Pacing Threshold Pulse Width: 0.4 ms
Lead Channel Setting Pacing Amplitude: 2.5 V
MDC IDC LEAD IMPLANT DT: 20000921
MDC IDC LEAD LOCATION: 753859
MDC IDC LEAD LOCATION: 753860
MDC IDC MSMT BATTERY IMPEDANCE: 150 Ohm
MDC IDC MSMT BATTERY VOLTAGE: 2.79 V
MDC IDC SET LEADCHNL RV PACING PULSEWIDTH: 0.4 ms
MDC IDC SET LEADCHNL RV SENSING SENSITIVITY: 2 mV
MDC IDC STAT BRADY RV PERCENT PACED: 100 %

## 2016-05-25 LAB — POCT INR: INR: 1.5

## 2016-05-26 ENCOUNTER — Encounter: Payer: Self-pay | Admitting: Cardiology

## 2016-05-26 ENCOUNTER — Telehealth: Payer: Self-pay | Admitting: *Deleted

## 2016-05-26 NOTE — Telephone Encounter (Signed)
Pt called & stated that she was unclear on her instructions from yesterday on how to take her Coumadin.  After going over the Anticoagulation instructions with the pt, I discovered that that she did not take the extra 1/2 tablet on yesterday. Therefore, advised pt to take the extra 1/2 tablet today (1.5 tablets total) and take an extra 1/2 tablet Thursday (1 tablet on total) then start taking 1 tablet daily except 1/2 tablet on Sundays, Tuesdays, and Thursdays. Advised pt to call back with any other questions or issues & she verbalized understanding.

## 2016-06-08 ENCOUNTER — Ambulatory Visit (INDEPENDENT_AMBULATORY_CARE_PROVIDER_SITE_OTHER): Payer: Medicare Other | Admitting: *Deleted

## 2016-06-08 DIAGNOSIS — I4821 Permanent atrial fibrillation: Secondary | ICD-10-CM

## 2016-06-08 DIAGNOSIS — I482 Chronic atrial fibrillation: Secondary | ICD-10-CM

## 2016-06-08 DIAGNOSIS — Z5181 Encounter for therapeutic drug level monitoring: Secondary | ICD-10-CM | POA: Diagnosis not present

## 2016-06-08 LAB — POCT INR: INR: 1.7

## 2016-06-10 ENCOUNTER — Other Ambulatory Visit: Payer: Self-pay | Admitting: Internal Medicine

## 2016-06-10 DIAGNOSIS — E038 Other specified hypothyroidism: Secondary | ICD-10-CM

## 2016-06-22 ENCOUNTER — Ambulatory Visit (INDEPENDENT_AMBULATORY_CARE_PROVIDER_SITE_OTHER): Payer: Medicare Other | Admitting: *Deleted

## 2016-06-22 DIAGNOSIS — Z5181 Encounter for therapeutic drug level monitoring: Secondary | ICD-10-CM

## 2016-06-22 DIAGNOSIS — I482 Chronic atrial fibrillation: Secondary | ICD-10-CM | POA: Diagnosis not present

## 2016-06-22 DIAGNOSIS — I4821 Permanent atrial fibrillation: Secondary | ICD-10-CM

## 2016-06-22 LAB — POCT INR: INR: 1.5

## 2016-07-06 ENCOUNTER — Ambulatory Visit (INDEPENDENT_AMBULATORY_CARE_PROVIDER_SITE_OTHER): Payer: Medicare Other | Admitting: *Deleted

## 2016-07-06 DIAGNOSIS — Z5181 Encounter for therapeutic drug level monitoring: Secondary | ICD-10-CM | POA: Diagnosis not present

## 2016-07-06 DIAGNOSIS — I4821 Permanent atrial fibrillation: Secondary | ICD-10-CM

## 2016-07-06 DIAGNOSIS — I482 Chronic atrial fibrillation: Secondary | ICD-10-CM | POA: Diagnosis not present

## 2016-07-06 LAB — POCT INR: INR: 2.1

## 2016-07-19 DIAGNOSIS — H16223 Keratoconjunctivitis sicca, not specified as Sjogren's, bilateral: Secondary | ICD-10-CM | POA: Diagnosis not present

## 2016-07-20 ENCOUNTER — Ambulatory Visit (INDEPENDENT_AMBULATORY_CARE_PROVIDER_SITE_OTHER): Payer: Medicare Other

## 2016-07-20 DIAGNOSIS — Z5181 Encounter for therapeutic drug level monitoring: Secondary | ICD-10-CM

## 2016-07-20 DIAGNOSIS — I482 Chronic atrial fibrillation: Secondary | ICD-10-CM | POA: Diagnosis not present

## 2016-07-20 DIAGNOSIS — I4821 Permanent atrial fibrillation: Secondary | ICD-10-CM

## 2016-07-20 LAB — POCT INR: INR: 2.7

## 2016-08-09 ENCOUNTER — Telehealth: Payer: Self-pay | Admitting: Internal Medicine

## 2016-08-09 NOTE — Telephone Encounter (Signed)
Received call transferred directly from operator and spoke with pt.  She reports she has had shortness of the breath off and on for years.  Was OK until about a week ago when she started having some shortness of breath mainly at night.  Previously took 40 mg lasix but currently on 20 mg daily.  She is not having any swelling.  Recent weight gain of 6-7 lbs since March but pt has been eating more and trying to gain weight after having 25 lb weight loss due to IBS/diarrhea. Diarrhea has now improved. Has not been eating high salt foods.  Chart reviewed and lasix was decreased to 20 mg daily at 12/08/15 office visit due to dizziness and "hazy" feeling.  Pt reports she  had one episode of dizziness in the last few weeks which was attributed to eye gtts.  No recent episodes of dizziness or hazy feeling.  Office visit from 12/08/15 notes pt may take lasix 40 mg if she has shortness of breath.  I told pt to increase lasix to 40 mg for the next 3 days and I would send the message to Dr. Caryl Comes for further recommendations.  I told pt to let us know if shortness of breath did not improve or go to ED if symptoms worsen.

## 2016-08-09 NOTE — Telephone Encounter (Signed)
New Message   Pt c/o Shortness Of Breath: STAT if SOB developed within the last 24 hours or pt is noticeably SOB on the phone  1. Are you currently SOB (can you hear that pt is SOB on the phone)? Per pt yes   2. How long have you been experiencing SOB? For about a week  3. Are you SOB when sitting or when up moving around? Come and goes   4. Are you currently experiencing any other symptoms? no

## 2016-08-10 NOTE — Telephone Encounter (Signed)
Called Pt Note seen from yday she had increased her furosemide from 20--40. She also understood from somebody that she was "dehydrated" so she started drinking copious fluids. She struggled again with nocturnal dyspnea.  She will take an extra furosemide 40 this evening and again tomorrow. She will cut down her by mouth fluids and we will call her again on Thursday.

## 2016-08-12 MED ORDER — POTASSIUM CHLORIDE ER 10 MEQ PO TBCR: 20.0000 meq | EXTENDED_RELEASE_TABLET | Freq: Every day | ORAL | 3 refills | Status: DC

## 2016-08-12 MED ORDER — FUROSEMIDE 40 MG PO TABS: 40.0000 mg | ORAL_TABLET | Freq: Every day | ORAL | Status: DC

## 2016-08-12 NOTE — Telephone Encounter (Signed)
I spoke with the patient to follow up on her symptoms. She states that Tuesday (7/3) night she took lasix 40 mg per Dr. Olin Pia recommendations. She took another lasix 40 mg Wed (7/4) morning and felt good. She reports she was fine until about 3 am this morning and she woke up SOB- she took an additional 20 mg of lasix around 3 am. After she woke up this morning, she took an additional 20 mg of lasix again. She reports leg cramping so she took what sound like Potassium 10 meq x 1 dose.  She is not weighing daily. She reports her baseline weight was 135 lbs, but she had a flare up of her IBS from October through March- her weight dropped to 115 lbs. She is currently around 122 lbs.  Review with Dr. Caryl Comes- orders received to: - take an additional lasix 20 mg now - take lasix 40 mg around 2 pm today - take potassium 20 meq today - take lasix 40 mg once daily (friday, Saturday, Sunday, Monday) - take potassium 20 meq once daily (friday, Saturday, Sunday, Monday) - follow up in the office with the PA/ NP tomorrow or Monday  I have reviewed the above with the patient and she verbalizes understanding of Dr. Olin Pia recommendations. I advised her I would have Dr. Olin Pia scheduler, Lenna Sciara, call her to arrange PA/ NP follow up for Friday/ Monday.  The patient is agreeable.

## 2016-08-12 NOTE — Telephone Encounter (Signed)
New Message   pt verbalized that she is returning call for Dr.Klein

## 2016-08-12 NOTE — Progress Notes (Deleted)
Cardiology Office Note    Date:  08/12/2016   ID:  Alyssa [name], DOB [birth year], MRN [id number]  PCP:  [name], MD  Cardiologist:  Dr. Caryl Comes   CC: SOB  History of Present Illness:  Alyssa [name] is a 83 y.o. female with a history of CHB w/PPM, permanent AFib on coumadin, hypothyroidism, IBS, GERD, LE edema/DOE that waxes and wanes who presents to clinic for evaluation of worsening SOB.   At her last visit with Dr. Caryl Comes in 08/2015, he mentioned historically a syncopal episode after prolonged standing and in the heat. Because of that somebody thought she might have been dehydrated and recommended that her diuretics be reduced. This was followed by increasing symptoms of shortness of breath and nocturnal dyspnea. She called and we suggested she increase her furosemide back to daily from every other day. With this her symptoms have largely resolved. She has some ongoing problems with dyspnea particularly at the end of the day and recommended at that visit she take her lasix BID alternating days with daily only.  Today she presents to clinic for evaluation of worsening SOB.   Past Medical History:  Diagnosis Date  . Atrial fibrillation ([place])    pacemaker, chronic anticoag  . Breast cancer ([place])   . Diverticulosis   . Fibroma    inner lips/mouth  . GERD (gastroesophageal reflux disease)   . Hx of breast cancer   . Hyperkalemia   . Hypothyroidism   . IBS (irritable bowel syndrome)   . Internal hemorrhoids   . Left inguinal hernia    direct  . MVP (mitral valve prolapse)   . Osteoporosis   . Renal insufficiency   . Thyroid cancer [facility])     Past Surgical History:  Procedure Laterality Date  . COLONOSCOPY  10/28/2005   internal hemorrhoids, diverticulosis (same as in 2002 and random bxs negative then)  . EP IMPLANTABLE DEVICE N/A 02/26/2015   Procedure: PPM Generator Changeout;  Surgeon: [name], MD;  Location: [facility];  Service: Cardiovascular;  Laterality:  N/A;  . MASTECTOMY  1982   Bilateral  . PACEMAKER INSERTION    . THYROIDECTOMY    . TONSILLECTOMY    . UPPER GASTROINTESTINAL ENDOSCOPY  09/06/2000   normal    Current Medications: Outpatient Medications Prior to Visit  Medication Sig Dispense Refill  . acetaminophen (TYLENOL ARTHRITIS PAIN) 650 MG CR tablet Take 1,300 mg by mouth 2 (two) times daily.      . diphenoxylate-atropine (LOMOTIL) 2.5-0.025 MG tablet Take 1 tablet by mouth 4 (four) times daily as needed for diarrhea or loose stools. 30 tablet 0  . furosemide (LASIX) 40 MG tablet Take 1 tablet (40 mg total) by mouth daily.    . irbesartan (AVAPRO) 150 MG tablet Take 1 tablet (150 mg total) by mouth daily. 90 tablet 1  . levothyroxine (SYNTHROID, LEVOTHROID) 88 MCG tablet Take 1 tablet (88 mcg total) by mouth daily. 90 tablet 1  . levothyroxine (SYNTHROID, LEVOTHROID) 88 MCG tablet TAKE ONE TABLET BY MOUTH ONCE DAILY 90 tablet 1  . OVER THE COUNTER MEDICATION Potassium prn    . potassium chloride (K-DUR) 10 MEQ tablet Take 2 tablets (20 mEq total) by mouth daily. 60 tablet 3  . warfarin (COUMADIN) 3 MG tablet TAKE BY MOUTH AS DIRECTED BY COUMADIN CLINIC 25 tablet 3   No facility-administered medications prior to visit.      Allergies:   Patient has no known  allergies.   Social History   Social History  . Marital status: Widowed    Spouse name: N/A  . Number of children: 3  . Years of education: N/A   Occupational History  . Retired     Social History Main Topics  . Smoking status: Former Smoker    Quit date: 02/08/1954  . Smokeless tobacco: Never Used  . Alcohol use No  . Drug use: No  . Sexual activity: Not Currently   Other Topics Concern  . Not on file   Social History Narrative   Regular Exercise: Yes   Daily Caffeine Use: Sometimes.              Family History:  The patient's family history includes Arthritis in her other; Colon cancer (age of onset: 21) in her father; Heart disease in her father;  Hypertension in her other; Stroke in her mother.      *** ROS/PE    Wt Readings from Last 3 Encounters:  04/20/16 119 lb (54 kg)  04/15/16 120 lb (54.4 kg)  03/16/16 117 lb (53.1 kg)      Studies/Labs Reviewed:   EKG:  EKG is*** ordered today.  The ekg ordered today demonstrates ***  Recent Labs: 02/09/2016: ALT 16; Hemoglobin 12.1; Platelets 259 04/15/2016: BUN 21; Creatinine, Ser 1.14; Potassium 4.7; Sodium 140; TSH 0.38   Lipid Panel    Component Value Date/Time   CHOL 155 02/11/2014 1521   TRIG 84.0 02/11/2014 1521   HDL 46.10 02/11/2014 1521   CHOLHDL 3 02/11/2014 1521   VLDL 16.8 02/11/2014 1521   LDLCALC 92 02/11/2014 1521    Additional studies/ records that were reviewed today include:  2D ECHO: 05/01/2015 LV EF: 55% -   60% Study Conclusions - Left ventricle: The cavity size was normal. Wall thickness was   normal. Systolic function was normal. The estimated ejection   fraction was in the range of 55% to 60%. Wall motion was normal;   there were no regional wall motion abnormalities. The study is   not technically sufficient to allow evaluation of LV diastolic   function. - Mitral valve: Calcified annulus. - Left atrium: The atrium was severely dilated. - Right ventricle: The cavity size was mildly dilated. - Right atrium: The atrium was severely dilated. - Tricuspid valve: There was severe regurgitation. - Pulmonary arteries: Systolic pressure was moderately increased.   PA peak pressure: 52 mm Hg (S). - Pericardium, extracardiac: A small pericardial effusion was   identified. Impressions: - Normal LV function; severe biatrial enlargement; mild RVE; severe   TR with moderately elevated pulmonary pressure; small pericardial   effusion.    ASSESSMENT & PLAN:   SOB:  Permanent AFib on coumadin:  Hypothyroidism:  IBS:  GERD:  CHB w/PPM:   Medication Adjustments/Labs and Tests Ordered: Current medicines are reviewed at length with the  patient today.  Concerns regarding medicines are outlined above.  Medication changes, Labs and Tests ordered today are listed in the Patient Instructions below. There are no Patient Instructions on file for this visit.   Signed, Angelena Form, PA-C  08/12/2016 4:45 PM    [facility] [address], [place], [place]  [address] Phone: [phone]; Fax: [phone]

## 2016-08-13 ENCOUNTER — Ambulatory Visit (INDEPENDENT_AMBULATORY_CARE_PROVIDER_SITE_OTHER): Payer: Medicare Other | Admitting: Emergency Medicine

## 2016-08-13 ENCOUNTER — Encounter: Payer: Self-pay | Admitting: Emergency Medicine

## 2016-08-13 VITALS — BP 120/76 | HR 87 | Ht 66.75 in | Wt 119.1 lb

## 2016-08-13 DIAGNOSIS — I4821 Permanent atrial fibrillation: Secondary | ICD-10-CM

## 2016-08-13 DIAGNOSIS — Z950 Presence of cardiac pacemaker: Secondary | ICD-10-CM

## 2016-08-13 DIAGNOSIS — R0602 Shortness of breath: Secondary | ICD-10-CM

## 2016-08-13 DIAGNOSIS — R0609 Other forms of dyspnea: Secondary | ICD-10-CM

## 2016-08-13 DIAGNOSIS — I482 Chronic atrial fibrillation: Secondary | ICD-10-CM | POA: Diagnosis not present

## 2016-08-13 DIAGNOSIS — K589 Irritable bowel syndrome without diarrhea: Secondary | ICD-10-CM

## 2016-08-13 DIAGNOSIS — I5030 Unspecified diastolic (congestive) heart failure: Secondary | ICD-10-CM | POA: Diagnosis not present

## 2016-08-13 DIAGNOSIS — R0600 Dyspnea, unspecified: Secondary | ICD-10-CM

## 2016-08-13 DIAGNOSIS — E039 Hypothyroidism, unspecified: Secondary | ICD-10-CM | POA: Diagnosis not present

## 2016-08-13 NOTE — Progress Notes (Signed)
Cardiology Office Note    Date:  08/13/2016   ID:  Alyssa [name], DOB [birth year], MRN [id number]  PCP:  [name], MD  Cardiologist:  Dr. Caryl Comes  CC: No chief complaint on file.   History of Present Illness:  Alyssa [name] is a 83 y.o. female with a history of CHB w/PPM, permanent AFib on coumadin, hypothyroidism, IBS, GERD, LE edema/DOE that waxes and wanes who presents to clinic for evaluation of worsening SOB.   At her last visit with Dr. Caryl Comes in 08/2015, he mentioned historically a syncopal episode after prolonged standing and in the heat. Because of that somebody thought she might have been dehydrated and recommended that her diuretics be reduced. This was followed by increasing symptoms of shortness of breath and nocturnal dyspnea. She called and we suggested she increase her furosemide back to daily from every other day. With this her symptoms have largely resolved. She has some ongoing problems with dyspnea particularly at the end of the day and recommended at that visit she take her lasix BID alternating days with daily only.   She called the office on Monday with some symptoms of shortness of breath. Dr. Caryl Comes gave her instructions regarding additional lasix and potassium. She took 20 mg in the AM on (7/5) and 40 mg at 2 pm (7/5) then 40 mg today. She felt significantly better on the higher dose of lasix  Today she presents to clinic for evaluation of worsening SOB. She feels like she is overall improving but her SOB is intermittent, not necessarily worse laying down, standing up or exerting herself. Has required 3 pillows at night. She has been having worsened SOB for the past 1.5 weeks and does not feel like she has had a flair up this severe in years. She admits to a lot of stress lately but that it is improving. From October to march she had severe IBS flair and lost a lot weight, has gained 5 lbs of body weight back. She had to see an eye doctor recently for multiple styes in  her eye, she was told they are due to stress. No longer having diarrhea. Stress is improving. She is complaint with her medications and is overall well appearing. No chest pains, palpitations, speaks in full sentences. No LE swelling.     Past Medical History:  Diagnosis Date  . Atrial fibrillation ([place])    pacemaker, chronic anticoag  . Breast cancer ([place])   . Diverticulosis   . Fibroma    inner lips/mouth  . GERD (gastroesophageal reflux disease)   . Hx of breast cancer   . Hyperkalemia   . Hypothyroidism   . IBS (irritable bowel syndrome)   . Internal hemorrhoids   . Left inguinal hernia    direct  . MVP (mitral valve prolapse)   . Osteoporosis   . Renal insufficiency   . Thyroid cancer [facility]     Past Surgical History:  Procedure Laterality Date  . COLONOSCOPY  10/28/2005   internal hemorrhoids, diverticulosis (same as in 2002 and random bxs negative then)  . EP IMPLANTABLE DEVICE N/A 02/26/2015   Procedure: PPM Generator Changeout;  Surgeon: [name], MD;  Location: [facility];  Service: Cardiovascular;  Laterality: N/A;  . MASTECTOMY  1982   Bilateral  . PACEMAKER INSERTION    . THYROIDECTOMY    . TONSILLECTOMY    . UPPER GASTROINTESTINAL ENDOSCOPY  09/06/2000   normal    Current Medications: Outpatient  Medications Prior to Visit  Medication Sig Dispense Refill  . acetaminophen (TYLENOL ARTHRITIS PAIN) 650 MG CR tablet Take 1,300 mg by mouth 2 (two) times daily.      . diphenoxylate-atropine (LOMOTIL) 2.5-0.025 MG tablet Take 1 tablet by mouth 4 (four) times daily as needed for diarrhea or loose stools. 30 tablet 0  . furosemide (LASIX) 40 MG tablet Take 1 tablet (40 mg total) by mouth daily.    . irbesartan (AVAPRO) 150 MG tablet Take 1 tablet (150 mg total) by mouth daily. 90 tablet 1  . levothyroxine (SYNTHROID, LEVOTHROID) 88 MCG tablet TAKE ONE TABLET BY MOUTH ONCE DAILY 90 tablet 1  . potassium chloride (K-DUR) 10 MEQ tablet Take 2 tablets (20  mEq total) by mouth daily. 60 tablet 3  . warfarin (COUMADIN) 3 MG tablet TAKE BY MOUTH AS DIRECTED BY COUMADIN CLINIC 25 tablet 3  . levothyroxine (SYNTHROID, LEVOTHROID) 88 MCG tablet Take 1 tablet (88 mcg total) by mouth daily. (Patient not taking: Reported on 08/13/2016) 90 tablet 1  . OVER THE COUNTER MEDICATION Take 1 tablet by mouth as directed. Potassium OTC - as needed     No facility-administered medications prior to visit.      Allergies:   Patient has no known allergies.   Social History   Social History  . Marital status: Widowed    Spouse name: N/A  . Number of children: 3  . Years of education: N/A   Occupational History  . Retired     Social History Main Topics  . Smoking status: Former Smoker    Quit date: 02/08/1954  . Smokeless tobacco: Never Used  . Alcohol use No  . Drug use: No  . Sexual activity: Not Currently   Other Topics Concern  . None   Social History Narrative   Regular Exercise: Yes   Daily Caffeine Use: Sometimes.              Family History:  The patient's*family history includes Arthritis in her other; Colon cancer (age of onset: 16) in her father; Heart disease in her father; Hypertension in her other; Stroke in her mother.      The patient denies anorexia, fever, , vision loss, decreased hearing, hoarseness, chest pain, syncope, peripheral edema, balance deficits, hemoptysis, abdominal pain, melena, hematochezia, severe indigestion/heartburn, hematuria, incontinence, genital sores, muscle weakness, suspicious skin lesions, transient blindness, difficulty walking, depression, unusual weight change, abnormal bleeding, enlarged lymph nodes, angioedema, and breast masses.  Physical Exam  Constitutional: She is well-developed, well-nourished, and in no distress. No distress.  HENT:  Head: Normocephalic and atraumatic.  Eyes: Conjunctivae and EOM are normal.  Neck: Normal range of motion.  Cardiovascular: Normal rate.  An irregularly  irregular rhythm present. Exam reveals no gallop and no friction rub.   No murmur heard. Pulmonary/Chest: Effort normal and breath sounds normal.  Abdominal: She exhibits no distension.  Musculoskeletal: Normal range of motion.  Minimal le swelling  Neurological: She is alert.  Skin: Skin is dry. She is not diaphoretic.  Psychiatric: Affect normal.     Wt Readings from Last 3 Encounters:  08/13/16 119 lb 1.9 oz (54 kg)  04/20/16 119 lb (54 kg)  04/15/16 120 lb (54.4 kg)      Studies/Labs Reviewed:   EKG:  EKG is ordered today.  The ekg ordered today demonstrates HR 87 with ventricular paced rhythm and atrial fibrillation  Recent Labs: 02/09/2016: ALT 16; Hemoglobin 12.1; Platelets 259 04/15/2016: BUN 21; Creatinine, Ser  1.14; Potassium 4.7; Sodium 140; TSH 0.38   Lipid Panel    Component Value Date/Time   CHOL 155 02/11/2014 1521   TRIG 84.0 02/11/2014 1521   HDL 46.10 02/11/2014 1521   CHOLHDL 3 02/11/2014 1521   VLDL 16.8 02/11/2014 1521   LDLCALC 92 02/11/2014 1521    Additional studies/ records that were reviewed today include:   2D ECHO: 05/01/2015  LV EF: 55% -  60%  Study Conclusions  - Left ventricle: The cavity size was normal. Wall thickness was  normal. Systolic function was normal. The estimated ejection  fraction was in the range of 55% to 60%. Wall motion was normal;  there were no regional wall motion abnormalities. The study is  not technically sufficient to allow evaluation of LV diastolic  function.  - Mitral valve: Calcified annulus.  - Left atrium: The atrium was severely dilated.  - Right ventricle: The cavity size was mildly dilated.  - Right atrium: The atrium was severely dilated.  - Tricuspid valve: There was severe regurgitation.  - Pulmonary arteries: Systolic pressure was moderately increased.  PA peak pressure: 52 mm Hg (S).  - Pericardium, extracardiac: A small pericardial effusion was  identified.  Impressions:  -  Normal LV function; severe biatrial enlargement; mild RVE; severe  TR with moderately elevated pulmonary pressure; small pericardial  effusion    ASSESSMENT & PLAN:    SOB: Acutely short of breath, on exam her presentation is mild and she appears euvolemic. Unclear if this is cardiac but she does endorse feeling much improved the day she had 60 mg of lasix. Will obtain CBC, BMP, TSH, and BNP to better understand. Her most recent Echocardiogram (05/01/2015) showed LV EF 55-60%, normal wall motion and unable to assess diastolic dysfunction. I do no think we need to repeat this at this time, may need repeat if she continues to decline. Typically takes 20 mg Lasix Daily, took 60 mg Lasix yesterday and then 40 mg today. Felt best taking 60 mg.   -- Will look out for results of labs. Follow-up with Dr. Caryl Comes or APP in 2 weeks.  Permanent AFib on coumadin: compliant with coumadin, asymptomatic   Hypothyroidism: PCP managing  IBS: Mostly resolved, being managed by primary provider  CHB w/PPM: Follows with Dr. Caryl Comes,  [place] 10/30/1998 with gen change 02/26/15.    Medication Adjustments/Labs and Tests Ordered: Current medicines are reviewed at length with the patient today.  Concerns regarding medicines are outlined above.  Medication changes, Labs and Tests ordered today are listed in the Patient Instructions below. Patient Instructions  Medication Instructions:  Your physician recommends that you continue on your current medications as directed. Please refer to the Current Medication list given to you today.   Labwork: TODAY:  BMP, PRO BNP, CBC, & TSH  Testing/Procedures: None ordered  Follow-Up: Your physician recommends that you schedule a follow-up appointment in: 2 WEEKS WITH RENEE USURY   Any Other Special Instructions Will Be Listed Below (If Applicable).     If you need a refill on your cardiac medications before your next appointment, please call your pharmacy.       Melvern Banker  08/13/2016 4:26 PM    [facility] [place], [place], [place]  [address] Phone: [phone]; Fax: [phone]

## 2016-08-13 NOTE — Patient Instructions (Signed)
Medication Instructions:  Your physician recommends that you continue on your current medications as directed. Please refer to the Current Medication list given to you today.   Labwork: TODAY:  BMP, PRO BNP, CBC, & TSH  Testing/Procedures: None ordered  Follow-Up: Your physician recommends that you schedule a follow-up appointment in: 2 WEEKS WITH RENEE USURY   Any Other Special Instructions Will Be Listed Below (If Applicable).     If you need a refill on your cardiac medications before your next appointment, please call your pharmacy.

## 2016-08-14 LAB — BASIC METABOLIC PANEL
BUN / CREAT RATIO: 29 — AB (ref 12–28)
BUN: 35 mg/dL (ref 10–36)
CHLORIDE: 100 mmol/L (ref 96–106)
CO2: 26 mmol/L (ref 20–29)
CREATININE: 1.2 mg/dL — AB (ref 0.57–1.00)
Calcium: 8.8 mg/dL (ref 8.7–10.3)
GFR calc non Af Amer: 40 mL/min/{1.73_m2} — ABNORMAL LOW (ref >59)
GFR, EST AFRICAN AMERICAN: 46 mL/min/{1.73_m2} — AB (ref >59)
GLUCOSE: 87 mg/dL (ref 65–99)
Potassium: 5 mmol/L (ref 3.5–5.2)
Sodium: 141 mmol/L (ref 134–144)

## 2016-08-14 LAB — CBC
HEMOGLOBIN: 12.3 g/dL (ref 11.1–15.9)
Hematocrit: 37.4 % (ref 34.0–46.6)
MCH: 30 pg (ref 26.6–33.0)
MCHC: 32.9 g/dL (ref 31.5–35.7)
MCV: 91 fL (ref 79–97)
PLATELETS: 204 10*3/uL (ref 150–379)
RBC: 4.1 x10E6/uL (ref 3.77–5.28)
RDW: 13.5 % (ref 12.3–15.4)
WBC: 5.1 10*3/uL (ref 3.4–10.8)

## 2016-08-14 LAB — PRO B NATRIURETIC PEPTIDE: NT-PRO BNP: 1529 pg/mL — AB (ref 0–738)

## 2016-08-14 LAB — TSH: TSH: 1.01 u[IU]/mL (ref 0.450–4.500)

## 2016-08-16 ENCOUNTER — Ambulatory Visit (INDEPENDENT_AMBULATORY_CARE_PROVIDER_SITE_OTHER): Payer: Medicare Other | Admitting: *Deleted

## 2016-08-16 DIAGNOSIS — I4821 Permanent atrial fibrillation: Secondary | ICD-10-CM

## 2016-08-16 DIAGNOSIS — Z5181 Encounter for therapeutic drug level monitoring: Secondary | ICD-10-CM

## 2016-08-16 DIAGNOSIS — I482 Chronic atrial fibrillation: Secondary | ICD-10-CM | POA: Diagnosis not present

## 2016-08-16 LAB — POCT INR: INR: 1.9

## 2016-08-17 ENCOUNTER — Other Ambulatory Visit: Payer: Self-pay | Admitting: Internal Medicine

## 2016-08-18 ENCOUNTER — Telehealth: Payer: Self-pay | Admitting: Internal Medicine

## 2016-08-18 NOTE — Telephone Encounter (Signed)
Patient complaining of feeling " weird " and felt like she was going to pass out. Patient stated she sat down and was too weak to stand up, but she never passed out. Patient stated that she thinks it's the Potassium pills. Patient stated she was not taking Potassium before, until her last visit when her lasix was increased. Patient has a device. Will send to device clinic to see if anything was going on with the patient's device during episode. Patient stated this happened around 4:00 pm today.

## 2016-08-18 NOTE — Telephone Encounter (Signed)
Patient calling, states that she had a "fainting spell." She sat down in the chair and tried to stand up and became weak almost to the point of passing out.   Patient mentions that she is taking Lasix and potassium and that the dosage for Lasix was increased. She would like to know if maybe the potassium is causing her to fell this way. Patient is aware that heather is out of office but would like to speak with someone.

## 2016-08-19 NOTE — Telephone Encounter (Signed)
Patient called and stated that she talked to [employer] and they determined she needed a new monitor. Device Clinic RN recommended that call be routed to triage. Pt aware that a Triage nurse will be calling her back.

## 2016-08-19 NOTE — Telephone Encounter (Signed)
Called and spoke with patient. She states that [employer] is sending her a new monitor that will arrive in 7-10 days so that she will be able to send her remote transmissions. Patient states that she has not had any other episodes since yesterday afternoon and that she feels fine. Patient did not have any vitals to offer, but states that she is going to get a BP cuff today. Patient advised to change positions slowly, keep her appointment with Tommye Standard, PA on 7/25, and let us know if her symptoms return or worsen prior to her appointment.

## 2016-08-19 NOTE — Telephone Encounter (Signed)
No transmission received. Alyssa [name] feels fine at this time. Attempted transmission/ troubleshooting monitor. Error code 3230. Unable to troubleshoot. Will return call tomorrow around 9am. Alyssa [name] agreeable and appreciative.

## 2016-08-19 NOTE — Telephone Encounter (Signed)
Attempted transmission again this morning, unable to troubleshoot. Error code 3230. I gave Alyssa [name] the number for [employer] support, she may need a new monitor entirely. No transmission will be received today.  Yesterday during the episode she had recently gotten up from resting a while and reports that everything got hazy but she rested at the sink and the feeling went away quickly. I advised her that it may have been her BP adjusting to position change. She has not had an episode since yesterday afternoon.  Chronic AF, on warfarin. Normal PPM function on 05/24/16 remote transmission. ROV with Tommye Standard, PA 09/01/16.  Routing back to triage.

## 2016-08-23 ENCOUNTER — Telehealth: Payer: Self-pay | Admitting: Internal Medicine

## 2016-08-23 ENCOUNTER — Telehealth: Payer: Self-pay | Admitting: Cardiology

## 2016-08-23 ENCOUNTER — Encounter: Payer: Medicare Other | Admitting: *Deleted

## 2016-08-23 NOTE — Telephone Encounter (Signed)
LMOVM reminding pt to send remote transmission.   

## 2016-08-23 NOTE — Telephone Encounter (Signed)
New message    Pt is calling about her transmission for today. She states that her box is broken and it will be a few more days before she gets her new one.

## 2016-08-23 NOTE — Telephone Encounter (Signed)
Spoke with patient and informed her to keep appt w/ PA on 09-01-16 and her remote appt for today will be cancelled. Pt verbalized understanding.

## 2016-08-31 NOTE — Progress Notes (Signed)
Cardiology Office Note Date:  08/31/2016  Patient ID:  Alyssa, [name] September 07, 1924, MRN [id number] PCP:  [name], MD  Electrophysiologist: Dr. Caryl Comes   Chief Complaint: planned 3 month f/u  History of Present Illness: Alyssa [name] is a 83 y.o. female with history of CHB w/PPM, permanent AFib, hypothyroidism, IBS, GERD, LE edema/DOE that waxes and wanes, at her last visit with Dr. Caryl Comes, he mentioned historically a syncopal episode after prolonged standing and in the heat. Because of that somebody thought she might have been dehydrated and recommended that her diuretics be reduced. This was followed by increasing symptoms of shortness of breath and nocturnal dyspnea. She called and we suggested she increase her furosemide back to daily from every other day. With this her symptoms have largely resolved. She has some ongoing problems with dyspnea particularly at the end of the day and recommended at that visit she take her lasix BID alternating days with daily only.  She was in the ER 12/04/15 with an episode of dizziness/near syncope that occurred upon standing and SOB, orthostatic vitals were reported as negative, CXR negative, BMET/CBC stable, and pacer check with normal function and discharged from the ER to keep her visit in the office today.  She was seen by myself afterwards and and in discussion felt likely secondary to being a little dry with episodes of diarrhea prior to the symptoms.  Most recently seen by general cardiology for an acute increase/onset of SOB/DOE, 08/13/16 T. [place], PAC, not felt to be overtly fluid OL, though had been taking 60mg  dose of lasix once day and 40mg the day of that visit (usually on 20mg ), and planned for labs and to f/u  She reports that the day she had called about feeling dizzy, she had been out all day shopping for hours and when she came home felt like she had likely just over done it.  She will occassionally feel fleetingly lightheaded upon  standing, this was more a feeling of being over tired perhaps, but to me today denies feeling like she was going to faint.  She does continue to have an unusually SOB, though this is different then her original SOB that seemed to bother her at night.  That particular symptom on 40mg  of lasix has resolved, se denies DOE or exertional symptoms at all, when she is up and about/busy or distracted she doesn't feel it, but in the afternoon when trying to relax is when she has felt this need to take a few breaths to feel like she is getting enough air, sometimes this takes a few minutes to settle.  She is up a couple pounds but does nt perceive water retention.   She explains to me that she suffered for 3 months early this year with her IBS and lost several pounds, and is finally eating better and thinks this is finally where her gain is from.  She denies any kind of CP or palpitations, no near syncope or syncope.  She inquires about anxiety as a source of her breathlessness, explaining that her life has been very busy, and seemed to take her a long time to start to feel better after her 3 mo IBS issues, and family trips, events and so on.  She denies any bleeding or signs of bleeding.  DEVICE information: MDT dual chamber PPM, implanted 10/30/1998 with gen change 02/26/15, Dr. Caryl Comes, [place] today   Past Medical History:  Diagnosis Date  . Atrial fibrillation ([place])  pacemaker, chronic anticoag  . Breast cancer ([place])   . Diverticulosis   . Fibroma    inner lips/mouth  . GERD (gastroesophageal reflux disease)   . Hx of breast cancer   . Hyperkalemia   . Hypothyroidism   . IBS (irritable bowel syndrome)   . Internal hemorrhoids   . Left inguinal hernia    direct  . MVP (mitral valve prolapse)   . Osteoporosis   . Renal insufficiency   . Thyroid cancer [facility])     Past Surgical History:  Procedure Laterality Date  . COLONOSCOPY  10/28/2005   internal hemorrhoids, diverticulosis (same  as in 2002 and random bxs negative then)  . EP IMPLANTABLE DEVICE N/A 02/26/2015   Procedure: PPM Generator Changeout;  Surgeon: [name], MD;  Location: [facility];  Service: Cardiovascular;  Laterality: N/A;  . MASTECTOMY  1982   Bilateral  . PACEMAKER INSERTION    . THYROIDECTOMY    . TONSILLECTOMY    . UPPER GASTROINTESTINAL ENDOSCOPY  09/06/2000   normal    Current Outpatient Prescriptions  Medication Sig Dispense Refill  . acetaminophen (TYLENOL ARTHRITIS PAIN) 650 MG CR tablet Take 1,300 mg by mouth 2 (two) times daily.      . diphenoxylate-atropine (LOMOTIL) 2.5-0.025 MG tablet Take 1 tablet by mouth 4 (four) times daily as needed for diarrhea or loose stools. 30 tablet 0  . furosemide (LASIX) 40 MG tablet Take 1 tablet (40 mg total) by mouth daily.    . irbesartan (AVAPRO) 150 MG tablet Take 1 tablet (150 mg total) by mouth daily. 90 tablet 1  . levothyroxine (SYNTHROID, LEVOTHROID) 88 MCG tablet TAKE ONE TABLET BY MOUTH ONCE DAILY 90 tablet 1  . potassium chloride (K-DUR) 10 MEQ tablet Take 2 tablets (20 mEq total) by mouth daily. 60 tablet 3  . warfarin (COUMADIN) 3 MG tablet TAKE AS DIRECTED BY COUMADIN CLINIC. 30 tablet 3   No current facility-administered medications for this visit.     Allergies:   Patient has no known allergies.   Social History:  The patient  reports that she quit smoking about 62 years ago. She has never used smokeless tobacco. She reports that she does not drink alcohol or use drugs.   Family History:  The patient's family history includes Arthritis in her other; Colon cancer (age of onset: 18) in her father; Heart disease in her father; Hypertension in her other; Stroke in her mother.  ROS:  Please see the history of present illness.  All other systems are reviewed and otherwise negative.   PHYSICAL EXAM: VS:  There were no vitals taken for this visit. BMI: There is no height or weight on file to calculate BMI. Well nourished, well  developed, in no acute distress  HEENT: normocephalic, atraumatic  Neck: no JVD, carotid bruits or masses Cardiac:  RRR; no significant murmurs, no rubs, or gallops Lungs:  CTA b/l, no wheezing, rhonchi or rales  Abd: soft, nontender MS: no deformity, age appropriate atrophy Ext:  no edema  Skin: warm and dry, no rash Neuro:  No gross deficits appreciated Psych: euthymic mood, full affect  PPM site is stable, no tethering or discomfort   EKG:  Done 12/03/15 is V paced PPM interrogation today by industry and reviewed by myself: battery and lead measurements are good, 3 HVR episodes,longest 10 seconds, looks AF, none since may  stable battery and lead status/measurements.  There was one high threshold alert noted by MDT representative,  lead testing was good today, lead history has been stable, in discussion, suspect was secondary to likely competitive rate/ectopy at the time, noting she does have some HR up to 120bpm by histograms, she is dependent today.   05/01/15: TTE Study Conclusions - Left ventricle: The cavity size was normal. Wall thickness was   normal. Systolic function was normal. The estimated ejection   fraction was in the range of 55% to 60%. Wall motion was normal;   there were no regional wall motion abnormalities. The study is   not technically sufficient to allow evaluation of LV diastolic   function. - Mitral valve: Calcified annulus. - Left atrium: The atrium was severely dilated. 30mm - Right ventricle: The cavity size was mildly dilated. - Right atrium: The atrium was severely dilated. - Tricuspid valve: There was severe regurgitation. - Pulmonary arteries: Systolic pressure was moderately increased.   PA peak pressure: 52 mm Hg (S). - Pericardium, extracardiac: A small pericardial effusion was   identified. Impressions: - Normal LV function; severe biatrial enlargement; mild RVE; severe   TR with moderately elevated pulmonary pressure; small pericardial    effusion.  Recent Labs: 02/09/2016: ALT 16 08/13/2016: BUN 35; Creatinine, Ser 1.20; Hemoglobin 12.3; NT-Pro BNP 1,529; Platelets 204; Potassium 5.0; Sodium 141; TSH 1.010  No results found for requested labs within last 8760 hours.   CrCl cannot be calculated (Unknown ideal weight.).   Wt Readings from Last 3 Encounters:  08/13/16 119 lb 1.9 oz (54 kg)  04/20/16 119 lb (54 kg)  04/15/16 120 lb (54.4 kg)     Other studies reviewed: Additional studies/records reviewed today include: summarized above  ASSESSMENT AND PLAN:  1. CHB/PPM     normal device function, no changes made  2. Permanent Afib     CHA2DS2Vasc is at least 3 on warfarin, monitored and managed with the coumadin clinic       3. HFpEF     euvolemic by exam      Her SOB/breathless feeling sound atypical of fluid OL     Will get CXR, she did have elevated BNP a couple weeks ago and is up a couple pounds     she will d/w her PMD her suspicion of anxiety   4. Dizziness ?     She is reminded to keep adequately hydrated, discussed balance of lasix/and hydration       Disposition: BMET, CXR, she is asked to notify us of any ongoing/recurent symptoms and she will d/w her PMD as well.  Will have her back in 3 months, sooner if needed, pending tests and symptoms.  Current medicines are reviewed at length with the patient today.  The patient did not have any concerns regarding medicines.  Haywood Lasso, PA-C 08/31/2016 12:49 PM     [place] [address] [phone] (office)  [phone] (fax)

## 2016-09-01 ENCOUNTER — Ambulatory Visit (INDEPENDENT_AMBULATORY_CARE_PROVIDER_SITE_OTHER): Payer: Medicare Other | Admitting: Physician Assistant

## 2016-09-01 ENCOUNTER — Ambulatory Visit (INDEPENDENT_AMBULATORY_CARE_PROVIDER_SITE_OTHER): Payer: Medicare Other | Admitting: *Deleted

## 2016-09-01 ENCOUNTER — Ambulatory Visit
Admission: RE | Admit: 2016-09-01 | Discharge: 2016-09-01 | Disposition: A | Discharge status: Home / Self Care | Payer: Medicare Other | Source: Ambulatory Visit | Attending: Physician Assistant | Admitting: Physician Assistant

## 2016-09-01 VITALS — BP 126/70 | HR 66 | Ht 66.75 in | Wt 123.0 lb

## 2016-09-01 DIAGNOSIS — I482 Chronic atrial fibrillation: Secondary | ICD-10-CM

## 2016-09-01 DIAGNOSIS — Z950 Presence of cardiac pacemaker: Secondary | ICD-10-CM

## 2016-09-01 DIAGNOSIS — R0602 Shortness of breath: Secondary | ICD-10-CM

## 2016-09-01 DIAGNOSIS — I4821 Permanent atrial fibrillation: Secondary | ICD-10-CM

## 2016-09-01 DIAGNOSIS — Z5181 Encounter for therapeutic drug level monitoring: Secondary | ICD-10-CM | POA: Diagnosis not present

## 2016-09-01 DIAGNOSIS — I5030 Unspecified diastolic (congestive) heart failure: Secondary | ICD-10-CM | POA: Diagnosis not present

## 2016-09-01 LAB — POCT INR: INR: 2.1

## 2016-09-01 NOTE — Patient Instructions (Addendum)
Medication Instructions:   Your physician recommends that you continue on your current medications as directed. Please refer to the Current Medication list given to you today.   If you need a refill on your cardiac medications before your next appointment, please call your pharmacy.  Labwork:  BMET TODAY    Testing/Procedures: A chest x-ray takes a picture of the organs and structures inside the chest, including the heart, lungs, and blood vessels. This test can show several things, including, whether the heart is enlarges; whether fluid is building up in the lungs; and whether pacemaker / defibrillator leads are still in place.  ADDRESS  [place], [place] [address]  [facility] [phone]     Follow-Up:  IN 3 MONTHS WITH RENEE URSUY   Any Other Special Instructions Will Be Listed Below (If Applicable).

## 2016-09-02 ENCOUNTER — Telehealth: Payer: Self-pay | Admitting: *Deleted

## 2016-09-02 LAB — BASIC METABOLIC PANEL
BUN/Creatinine Ratio: 25 (ref 12–28)
BUN: 29 mg/dL (ref 10–36)
CO2: 25 mmol/L (ref 20–29)
CREATININE: 1.16 mg/dL — AB (ref 0.57–1.00)
Calcium: 9.1 mg/dL (ref 8.7–10.3)
Chloride: 102 mmol/L (ref 96–106)
GFR calc Af Amer: 48 mL/min/{1.73_m2} — ABNORMAL LOW (ref >59)
GFR, EST NON AFRICAN AMERICAN: 42 mL/min/{1.73_m2} — AB (ref >59)
GLUCOSE: 86 mg/dL (ref 65–99)
Potassium: 4.9 mmol/L (ref 3.5–5.2)
Sodium: 143 mmol/L (ref 134–144)

## 2016-09-02 NOTE — Telephone Encounter (Signed)
LMOVM TO CALL BACK ABOUT RESULTS

## 2016-09-02 NOTE — Telephone Encounter (Signed)
-----   Message from [facility], [place] sent at 09/01/2016  7:56 PM EDT ----- Please let the patient know her cxr did not show any evidence of fluid or infection.  She should see her PMD as we discussed, please forward result to her primary MD.  Thanks renee

## 2016-09-03 ENCOUNTER — Telehealth: Payer: Self-pay | Admitting: *Deleted

## 2016-09-03 ENCOUNTER — Telehealth: Payer: Self-pay | Admitting: Internal Medicine

## 2016-09-03 MED ORDER — FUROSEMIDE 40 MG PO TABS: 20.0000 mg | ORAL_TABLET | Freq: Every day | ORAL | Status: DC

## 2016-09-03 NOTE — Telephone Encounter (Signed)
LMOVM TO CALL BACK FOR RESULTS 

## 2016-09-03 NOTE — Telephone Encounter (Signed)
°  Follow Up  Calling to follow up on results. Please call.

## 2016-09-03 NOTE — Telephone Encounter (Signed)
Called patient back and provided lab and cxr results.  She has not been taking potassium at all for a while.  She will cut back lasix to 1/2 tablet daily and will monitor SOB/swelling and will call with any worsening symptoms.  She is aware I am forwarding CXR and labs to PCP.  I advised her to call and schedule follow up with Dr. [name].

## 2016-09-03 NOTE — Telephone Encounter (Signed)
-----   Message from [facility], [place] sent at 09/03/2016  8:07 AM EDT ----- Please let the patient know her lab looks OK, kidney function and potassium look OK.  Recommend since I do not think her current SOB is due to fluid, to decrease to 1/2 current dose of her lasix/potassium and let us know if she has any increase in symptom/SOB, or swelling.  Thanks [employer]

## 2016-09-14 ENCOUNTER — Telehealth: Payer: Self-pay | Admitting: Internal Medicine

## 2016-09-14 NOTE — Telephone Encounter (Signed)
I spoke with the patient. She states she has had a flare up of IBS again - starting Saturday after eating some spinach. She reports terrible diarrhea on Saturday. She took 2 lomtil on Sunday.   She is currently taking lasix 40 mg- 1/2 tablet once daily, but no potassium. She questions if she if she should be taking lasix at this time with her IBS flare up/ her potassium as well. She did have 1 episode of SOB today, but none since her office visit with [place], [place] on 09/01/16. Chest x-ray done at that time shows no fluid on board. I advised the patient I will review with Dr. Caryl Comes and call her back. She is agreeable.

## 2016-09-14 NOTE — Telephone Encounter (Signed)
I called and spoke with the patient- she is aware of Dr. Olin Pia recommendations to hold lasix/ potassium while having diarrhea.  I have advised her that when she starts back on lasix 20 mg once daily, she should be ok to continue to hold potassium based on last lab results (she was not on potassium at the time).  She will call if she develops leg cramps and we will recheck her BMP to know what dose of potassium to put her on.  She is agreeable.

## 2016-09-14 NOTE — Telephone Encounter (Signed)
Would hold diuretic and potassium during diarrhea

## 2016-09-14 NOTE — Telephone Encounter (Signed)
Patient calling, states that she is having some IBS troubles and that she has been talking with you about these issues.

## 2016-09-15 ENCOUNTER — Encounter: Payer: Self-pay | Admitting: Internal Medicine

## 2016-09-15 ENCOUNTER — Ambulatory Visit (INDEPENDENT_AMBULATORY_CARE_PROVIDER_SITE_OTHER): Payer: Medicare Other | Admitting: Internal Medicine

## 2016-09-15 VITALS — BP 100/54 | HR 90 | Temp 97.8°F | Resp 16 | Ht 66.75 in | Wt 120.0 lb

## 2016-09-15 DIAGNOSIS — K580 Irritable bowel syndrome with diarrhea: Secondary | ICD-10-CM

## 2016-09-15 MED ORDER — ELUXADOLINE 75 MG PO TABS: 1.0000 | ORAL_TABLET | Freq: Two times a day (BID) | ORAL | 1 refills | Status: DC

## 2016-09-15 NOTE — Progress Notes (Signed)
Subjective:  Patient ID: Alyssa [name], female    DOB: Nov 23, 1924  Age: 83 y.o. MRN: [id number]  CC: Diarrhea   HPI Alyssa [name] presents for recurrent episodes of diarrhea with an occasional abdominal cramping. She has tried to control the symptoms with Lomotil which does help but it causes her to feel constipated and bloated. She has had this for many years. She denies bloody stool, nausea, vomiting, loss of appetite, or weight loss.  Outpatient Medications Prior to Visit  Medication Sig Dispense Refill  . acetaminophen (TYLENOL ARTHRITIS PAIN) 650 MG CR tablet Take 1,300 mg by mouth 2 (two) times daily.      . diphenoxylate-atropine (LOMOTIL) 2.5-0.025 MG tablet Take 1 tablet by mouth 4 (four) times daily as needed for diarrhea or loose stools. 30 tablet 0  . furosemide (LASIX) 40 MG tablet Take 0.5 tablets (20 mg total) by mouth daily. 30 tablet   . irbesartan (AVAPRO) 150 MG tablet Take 1 tablet (150 mg total) by mouth daily. 90 tablet 1  . levothyroxine (SYNTHROID, LEVOTHROID) 88 MCG tablet TAKE ONE TABLET BY MOUTH ONCE DAILY 90 tablet 1  . potassium chloride (K-DUR) 10 MEQ tablet Take 2 tablets (20 mEq total) by mouth daily. 60 tablet 3  . warfarin (COUMADIN) 3 MG tablet TAKE AS DIRECTED BY COUMADIN CLINIC. 30 tablet 3   No facility-administered medications prior to visit.     ROS Review of Systems  Constitutional: Negative.  Negative for appetite change, chills, diaphoresis, fatigue and fever.  HENT: Negative.  Negative for trouble swallowing.   Eyes: Negative for visual disturbance.  Respiratory: Negative for cough, chest tightness, shortness of breath and wheezing.   Cardiovascular: Negative for chest pain, palpitations and leg swelling.  Gastrointestinal: Positive for abdominal pain and diarrhea. Negative for blood in stool, constipation, nausea and vomiting.  Endocrine: Negative.   Genitourinary: Negative.  Negative for difficulty urinating.  Musculoskeletal: Negative.   Negative for myalgias.  Skin: Negative.  Negative for color change and rash.  Allergic/Immunologic: Negative.   Neurological: Negative.  Negative for dizziness, weakness and headaches.  Hematological: Negative.  Negative for adenopathy. Does not bruise/bleed easily.  Psychiatric/Behavioral: Negative.     Objective:  BP (!) 100/54 (BP Location: Left Arm, Patient Position: Sitting, Cuff Size: Normal)   Pulse (!) 107   Temp 97.8 F (36.6 C) (Oral)   Ht 5' 6.75" (1.695 m)   Wt 120 lb (54.4 kg)   SpO2 97%   BMI 18.94 kg/m   BP Readings from Last 3 Encounters:  09/15/16 (!) 100/54  09/01/16 126/70  08/13/16 120/76    Wt Readings from Last 3 Encounters:  09/15/16 120 lb (54.4 kg)  09/01/16 123 lb (55.8 kg)  08/13/16 119 lb 1.9 oz (54 kg)    Physical Exam  Constitutional: She is oriented to person, place, and time. No distress.  HENT:  Mouth/Throat: Oropharynx is clear and moist. No oropharyngeal exudate.  Eyes: Conjunctivae are normal. Right eye exhibits no discharge. Left eye exhibits no discharge. No scleral icterus.  Neck: Normal range of motion. Neck supple. No JVD present. No thyromegaly present.  Cardiovascular: Normal rate, regular rhythm and intact distal pulses.  Exam reveals no gallop and no friction rub.   No murmur heard. Pulmonary/Chest: Effort normal and breath sounds normal. No respiratory distress. She has no wheezes. She has no rales. She exhibits no tenderness.  Abdominal: Soft. Bowel sounds are normal. She exhibits no distension and no mass. There is no  tenderness. There is no rebound and no guarding.  Musculoskeletal: Normal range of motion. She exhibits no edema, tenderness or deformity.  Lymphadenopathy:    She has no cervical adenopathy.  Neurological: She is alert and oriented to person, place, and time.  Skin: Skin is warm and dry. No rash noted. She is not diaphoretic. No erythema. No pallor.  Vitals reviewed.   Lab Results  Component Value Date    WBC 5.1 08/13/2016   HGB 12.3 08/13/2016   HCT 37.4 08/13/2016   PLT 204 08/13/2016   GLUCOSE 86 09/01/2016   CHOL 155 02/11/2014   TRIG 84.0 02/11/2014   HDL 46.10 02/11/2014   LDLCALC 92 02/11/2014   ALT 16 02/09/2016   AST 23 02/09/2016   NA 143 09/01/2016   K 4.9 09/01/2016   CL 102 09/01/2016   CREATININE 1.16 (H) 09/01/2016   BUN 29 09/01/2016   CO2 25 09/01/2016   TSH 1.010 08/13/2016   INR 2.1 09/01/2016   HGBA1C 5.9 06/09/2012    Dg Chest 2 View  Result Date: 09/01/2016 CLINICAL DATA:  Shortness of breath EXAM: CHEST  2 VIEW COMPARISON:  05/04/2016, 12/15/2015 FINDINGS: Left-sided dual lead pacing device as before. Postsurgical changes at the right axilla. Hyperinflation. No focal infiltrate or effusion. Moderate cardiomegaly with atherosclerosis. No pneumothorax. Stable compression deformity at approximate T12 level. IMPRESSION: Hyperinflation.  No acute infiltrate.  Cardiomegaly without edema Electronically Signed   By: Donavan Foil M.D.   On: 09/01/2016 17:05    Assessment & Plan:   Kashina was seen today for diarrhea.  Diagnoses and all orders for this visit:  Irritable bowel syndrome with diarrhea- Her current sx's do not suggest serious GI pathology. Will try Viberzi to control the sx's. -     Eluxadoline (VIBERZI) 75 MG TABS; Take 1 tablet by mouth 2 (two) times daily.   I am having Ms. Bleau start on Eluxadoline. I am also having her maintain her acetaminophen, diphenoxylate-atropine, irbesartan, levothyroxine, potassium chloride, warfarin, and furosemide.  Meds ordered this encounter  Medications  . Eluxadoline (VIBERZI) 75 MG TABS    Sig: Take 1 tablet by mouth 2 (two) times daily.    Dispense:  180 tablet    Refill:  1     Follow-up: No Follow-up on file.  Alyssa Calico, MD

## 2016-09-15 NOTE — Patient Instructions (Signed)

## 2016-09-17 ENCOUNTER — Telehealth: Payer: Self-pay

## 2016-09-17 ENCOUNTER — Telehealth: Payer: Self-pay | Admitting: Internal Medicine

## 2016-09-17 NOTE — Telephone Encounter (Signed)
F/u message  Pt states she is returning Rn call. Please call back to discuss

## 2016-09-17 NOTE — Telephone Encounter (Signed)
Pt called and stated that she feels like she is going to start having diarrhea again. She wanted to know if she can take both the lomotil and viberzi at the same time.   Informed that Dr. Carlean Purl removed the lomotil on 09/15/2016 after the appt with Dr. [name]. Instructed patient to only use the viberzi for now.

## 2016-09-17 NOTE — Telephone Encounter (Signed)
I spoke with the patient.  She states she saw her PCP for her IBS and he put her on Viberzi twice daily to replace the lomotil since she was still having diarrhea. She has maintained on the lasix 20 mg once daily, but has started to develop SOB again.   She questioned if she could go back up to the 40 mg dose of lasix once daily- I advised with her symptoms of SOB, to increase lasix to 40 mg once daily x the next 3 days to see if symptoms improve.  She is taking potassium 20 meq once daily. She questioned if there was any issue with her taking the lasix and viberzi- I advised I was not familiar with this drug so I will have to follow up with Dr. Caryl Comes about this and her symptoms next week.  She will call the office Monday if her sob is no better, otherwise, I will call her back on Tuesday when I return.  She is agreeable.

## 2016-09-20 NOTE — Telephone Encounter (Signed)
Follow up       Calling to give update on sob.  Pt c/o Shortness Of Breath: STAT if SOB developed within the last 24 hours or pt is noticeably SOB on the phone  1. Are you currently SOB (can you hear that pt is SOB on the phone)?  no  2. How long have you been experiencing SOB?   3. Are you SOB when sitting or when up moving around?  It is noticed more when pt is sitting still.  However, sob was better yesterday but this am around 10, sob was back again in full force  4. Are you currently experiencing any other symptoms?  no

## 2016-09-20 NOTE — Telephone Encounter (Signed)
I spoke with patient, confirmed she has taken lasix 40 mg Saturday, Sunday, and will take 40 mg  today. Pt states that she does not weigh daily, but she feels her weight is down. Pt denies LE edema or abdominal distention.  Pt states when she gets up in the morning she feels great, her shortness of breath returns about 10 AM in the morning. Pt states BP today at 1 PM 134/77. Pt denies shortness of breath at night, states it is worse when sitting down, getting up and walking around improves her shortness of breath. Pt states her shortness of breath is about the same as Friday. Pt states she will take her final dose of lasix 40 mg this evening, would like to give it another day and see how she feels.  Pt aware I will forward to [facility] for review and follow-up with her tomorrow.

## 2016-09-21 ENCOUNTER — Ambulatory Visit (INDEPENDENT_AMBULATORY_CARE_PROVIDER_SITE_OTHER): Payer: Medicare Other | Admitting: Internal Medicine

## 2016-09-21 ENCOUNTER — Ambulatory Visit (INDEPENDENT_AMBULATORY_CARE_PROVIDER_SITE_OTHER): Payer: Medicare Other | Admitting: Pharmacist

## 2016-09-21 VITALS — BP 120/62 | Ht 66.75 in | Wt 120.0 lb

## 2016-09-21 DIAGNOSIS — Z5181 Encounter for therapeutic drug level monitoring: Secondary | ICD-10-CM | POA: Diagnosis not present

## 2016-09-21 DIAGNOSIS — R0602 Shortness of breath: Secondary | ICD-10-CM

## 2016-09-21 DIAGNOSIS — I4821 Permanent atrial fibrillation: Secondary | ICD-10-CM

## 2016-09-21 DIAGNOSIS — I482 Chronic atrial fibrillation: Secondary | ICD-10-CM

## 2016-09-21 LAB — POCT INR: INR: 4.5

## 2016-09-21 NOTE — Telephone Encounter (Signed)
F/U Call:  Patient calling, states that she is just following up in regards to previous note

## 2016-09-21 NOTE — Telephone Encounter (Signed)
I spoke with the patient in regards to her symptoms. She states she woke up at 5 am with extreme SOB. She continues to take lasix 40 mg daily at night. She has not noticed much change in her urine output. She has developed some sinus drainage that has no color to it. She is having extreme dizziness in the morning, but by afternoon, her breathing and dizziness seem to resolve.  I advised I will address with Dr. Caryl Comes. She is coming for a coumadin check this afternoon and will discuss with her at that time.  She is agreeable.

## 2016-09-21 NOTE — Telephone Encounter (Signed)
Reviewed symptoms with Dr. Caryl Comes- he would like to see the patient this afternoon when she comes for her INR check.

## 2016-09-21 NOTE — Progress Notes (Signed)
Patient Care Team: [name], MD as PCP - General [name], MD (Cardiology)   HPI  Alyssa [name] is a 83 y.o. female seen  as an add-on because of worsening dyspnea and orthopnea/nocturnal dyspnea in the last couple of days.  She is also noted significant fatigue in the mid morning. This is not associated with shortness of breath. Typically last 15-30 minutes is unassociated with chest pain.   She has  noted only trace peripheral edema.   She has atrial fibrillation which is permanent. She is taking and tolerating her Coumadin.     3/17 echocardiography>>Normal LV function; severe biatrial enlargement; mild RVE; severe  TR with moderately elevated pulmonary pressure; small pericardial  effusion.    Date Cr Hgb  7/18 1.16 12.3          Past Medical History:  Diagnosis Date  . Atrial fibrillation ([place])    pacemaker, chronic anticoag  . Breast cancer ([place])   . Diverticulosis   . Fibroma    inner lips/mouth  . GERD (gastroesophageal reflux disease)   . Hx of breast cancer   . Hyperkalemia   . Hypothyroidism   . IBS (irritable bowel syndrome)   . Internal hemorrhoids   . Left inguinal hernia    direct  . MVP (mitral valve prolapse)   . Osteoporosis   . Renal insufficiency   . Thyroid cancer [facility])     Past Surgical History:  Procedure Laterality Date  . COLONOSCOPY  10/28/2005   internal hemorrhoids, diverticulosis (same as in 2002 and random bxs negative then)  . EP IMPLANTABLE DEVICE N/A 02/26/2015   Procedure: PPM Generator Changeout;  Surgeon: [name], MD;  Location: [place] [facility];  Service: Cardiovascular;  Laterality: N/A;  . MASTECTOMY  1982   Bilateral  . PACEMAKER INSERTION    . THYROIDECTOMY    . TONSILLECTOMY    . UPPER GASTROINTESTINAL ENDOSCOPY  09/06/2000   normal    Current Outpatient Prescriptions  Medication Sig Dispense Refill  . acetaminophen (TYLENOL ARTHRITIS PAIN) 650 MG CR tablet Take 1,300 mg by mouth 2 (two)  times daily.      . Eluxadoline (VIBERZI) 75 MG TABS Take 1 tablet by mouth 2 (two) times daily. 180 tablet 1  . furosemide (LASIX) 40 MG tablet Take 40 mg by mouth daily.    . irbesartan (AVAPRO) 150 MG tablet Take 1 tablet (150 mg total) by mouth daily. 90 tablet 1  . levothyroxine (SYNTHROID, LEVOTHROID) 88 MCG tablet TAKE ONE TABLET BY MOUTH ONCE DAILY 90 tablet 1  . potassium chloride (K-DUR) 10 MEQ tablet Take 2 tablets (20 mEq total) by mouth daily. 60 tablet 3  . warfarin (COUMADIN) 3 MG tablet TAKE AS DIRECTED BY COUMADIN CLINIC. 30 tablet 3   No current facility-administered medications for this visit.     No Known Allergies  Review of Systems negative except from HPI and PMH  Physical Exam BP 120/62   Ht 5' 6.75" (1.695 m)   Wt 120 lb (54.4 kg)   BMI 18.94 kg/m  Well developed and nourished in no acute distress HENT normal Neck supple with JVP8-10 Carotids brisk and full without bruits Clear Regular rate and rhythm, 3/6 m at LLSB  Abd-soft with active BS without hepatomegaly No Clubbing cyanosis edema Skin-warm and dry A & Oriented  Grossly normal sensory and motor function   ECG demonstrates ventricular pacing at 90 with underlying atrial fibrillation Intervals-/15/42 Axis left -85  Assessment and  Plan  Complete heart block  Atrial fibrillation   HFpEF Acute chronic  TR and RVE  Syncope  Pacemaker-[employer] The patient's device was interrogated.  The information was reviewed. No changes were made in the programming.    Continue anticoagulation  She is having orthopnea/PND-- hard to assess LV filling pressures given her TR  Will increase her diuretics and see how she does  Will repeat an echo

## 2016-09-21 NOTE — Patient Instructions (Addendum)
Medication Instructions: - Your physician has recommended you make the following change in your medication:  1) Increase lasix (furosemide) to 80 mg once daily x 3 days, then resume 40 mg once daily  Labwork: - none ordered  Procedures/Testing: - Your physician has requested that you have an echocardiogram. Echocardiography is a painless test that uses sound waves to create images of your heart. It provides your doctor with information about the size and shape of your heart and how well your heart's chambers and valves are working. This procedure takes approximately one hour. There are no restrictions for this procedure.  Follow-Up: - Your physician wants you to follow-up in: March 2019 with Dr. Caryl Comes as planned (pending echo results) You will receive a reminder letter in the mail two months in advance. If you don't receive a letter, please call our office to schedule the follow-up appointment.   Any Additional Special Instructions Will Be Listed Below (If Applicable).     If you need a refill on your cardiac medications before your next appointment, please call your pharmacy.

## 2016-09-24 ENCOUNTER — Telehealth: Payer: Self-pay | Admitting: Internal Medicine

## 2016-09-24 NOTE — Telephone Encounter (Signed)
Call received from the patient today to update Dr. Caryl Comes on her symptoms. She was seen in the office on Tuesday and has taken lasix 80 mg daily x 3 days. She states she felt really good on Wednesday with no sob, but this did start back up for her yesterday about 10am. She reports SOB and a "hazy" feeling in her eyes.  She has had increased urine output on the higher dose of lasix. She is pending a repeat echo on 8/21. I advised I will review symptoms with Dr. Caryl Comes and call her back later today. She is agreeable.

## 2016-09-24 NOTE — Telephone Encounter (Signed)
Lets continue the higher dose ( 80) for the weekend until the echo on tues PA visit next week

## 2016-09-24 NOTE — Telephone Encounter (Signed)
I called and spoke with the patient. She is aware of Dr. Olin Pia recommendations to continue lasix 80 mg daily until her echo on Tuesday next week.  She is also aware that she will be contacted next week to follow up with one of the PA/ NP's later in the week. This will be to re-evaluate her fluid status and go over her echo results per Dr. Caryl Comes.  She is agreeable with all of the above.

## 2016-09-27 ENCOUNTER — Other Ambulatory Visit: Payer: Self-pay | Admitting: Internal Medicine

## 2016-09-27 DIAGNOSIS — I071 Rheumatic tricuspid insufficiency: Secondary | ICD-10-CM

## 2016-09-27 DIAGNOSIS — I10 Essential (primary) hypertension: Secondary | ICD-10-CM

## 2016-09-28 ENCOUNTER — Other Ambulatory Visit: Payer: Self-pay

## 2016-09-28 ENCOUNTER — Ambulatory Visit (HOSPITAL_COMMUNITY): Payer: Medicare Other | Attending: Cardiovascular Disease

## 2016-09-28 DIAGNOSIS — I081 Rheumatic disorders of both mitral and tricuspid valves: Secondary | ICD-10-CM | POA: Insufficient documentation

## 2016-09-28 DIAGNOSIS — Z853 Personal history of malignant neoplasm of breast: Secondary | ICD-10-CM | POA: Diagnosis not present

## 2016-09-28 DIAGNOSIS — R0602 Shortness of breath: Secondary | ICD-10-CM | POA: Diagnosis not present

## 2016-09-29 ENCOUNTER — Telehealth: Payer: Self-pay | Admitting: Internal Medicine

## 2016-09-29 ENCOUNTER — Encounter: Payer: Self-pay | Admitting: Cardiology

## 2016-09-29 NOTE — Telephone Encounter (Signed)
I spoke with the patient. She states her breathing is better on the lasix 80 mg daily dose- she is not having the "heavy breathing" like she was. She states her weight is down from 120-122 to 117 lbs. She is having some dizziness- she came in her her echo yesterday and felt like she might pass out. She has decided to stop Viberzi for her IBS to see if this helps. I have recommended that she also cut her lasix back to 40 mg once daily today to see if this helps. She will follow up in the office tomorrow with Ellen Henri, PA for symptoms and her echo results.  She voices understanding.

## 2016-09-29 NOTE — Telephone Encounter (Signed)
New message    Pt is calling asking for a call back from [facility]. She said she was supposed to call after having an ultrasound yesterday.

## 2016-09-30 ENCOUNTER — Ambulatory Visit (INDEPENDENT_AMBULATORY_CARE_PROVIDER_SITE_OTHER): Payer: Medicare Other | Admitting: Cardiology

## 2016-09-30 ENCOUNTER — Encounter: Payer: Self-pay | Admitting: Cardiology

## 2016-09-30 VITALS — BP 110/64 | HR 67 | Ht 66.75 in | Wt 119.4 lb

## 2016-09-30 DIAGNOSIS — I5030 Unspecified diastolic (congestive) heart failure: Secondary | ICD-10-CM | POA: Diagnosis not present

## 2016-09-30 NOTE — Progress Notes (Signed)
09/30/2016 Alyssa [name]   Mar 23, 1924  [id number]  Primary Physician Alyssa Lima, MD Primary Cardiologist: Dr. Caryl [name]    Reason for Visit/CC: f/u for HFpEF  HPI:  83 y.o. female with history of CHB w/PPM, permanent AFib, hypothyroidism, IBS, GERD, LE edema/DOE that waxes and wanes. Echocardiogram 05/01/2015 showed normal left ventricular systolic function with an ejection fraction of 55-60%. This also noted severe by atrial enlargement and severe TR with moderately elevated pulmonary pressures. She is followed by Dr. Caryl [name] in the device clinic.  She was recently added on to his schedule on 09/21/2016 given complaints of increased dyspnea. She also noted orthopnea/nocturnal dyspnea as well as significant fatigue. She denied chest pain. Per Dr. Olin Pia note she only had trace peripheral edema. He noted that it was hard to assess LV filling pressures given her TR. He decided to increase her diuretics and also ordered a repeat echo. He instructed her to increase her Lasix to 80 mg once daily 3 days then resume 40 mg once daily. No laboratory work was obtained. She presents back to clinic today to review echo results and to assess her response to her diuretics.   Her office weight in clinic on 8/14 was 120 pounds. Her weight today is 119 pounds. Her echocardiogram was performed 09/28/16. This showed normal left ventricular systolic function. EF was 60-65%. Moderate MR was noted. Moderate to severe tricuspid regurgitation was also noted. Pulmonary artery systolic pressure was mildly increased at 32 mm Hg. No pericardial effusion.  She reports improvement in symptoms. Her breathing has improved. No further orthopnea or PND.  BP is stable.   No outpatient prescriptions have been marked as taking for the 09/30/16 encounter (Office Visit) with Alyssa Pandy, PA-C.   No Known Allergies Past Medical History:  Diagnosis Date  . Atrial fibrillation ([place])    pacemaker, chronic anticoag  . Breast  cancer ([place])   . Diverticulosis   . Fibroma    inner lips/mouth  . GERD (gastroesophageal reflux disease)   . Hx of breast cancer   . Hyperkalemia   . Hypothyroidism   . IBS (irritable bowel syndrome)   . Internal hemorrhoids   . Left inguinal hernia    direct  . MVP (mitral valve prolapse)   . Osteoporosis   . Renal insufficiency   . Thyroid cancer ([place])    Family History  Problem Relation Age of Onset  . Colon cancer Father 37  . Heart disease Father   . Stroke Mother   . Arthritis Other   . Hypertension Other   . Esophageal cancer Neg Hx   . Pancreatic cancer Neg Hx   . Kidney disease Neg Hx   . Liver disease Neg Hx   . Diabetes Neg Hx   . Rectal cancer Neg Hx   . Stomach cancer Neg Hx    Past Surgical History:  Procedure Laterality Date  . COLONOSCOPY  10/28/2005   internal hemorrhoids, diverticulosis (same as in 2002 and random bxs negative then)  . EP IMPLANTABLE DEVICE N/A 02/26/2015   Procedure: PPM Generator Changeout;  Surgeon: [name], MD;  Location: [facility];  Service: Cardiovascular;  Laterality: N/A;  . MASTECTOMY  1982   Bilateral  . PACEMAKER INSERTION    . THYROIDECTOMY    . TONSILLECTOMY    . UPPER GASTROINTESTINAL ENDOSCOPY  09/06/2000   normal   Social History   Social History  . Marital status: Widowed    Spouse  name: N/A  . Number of children: 3  . Years of education: N/A   Occupational History  . Retired     Social History Main Topics  . Smoking status: Former Smoker    Quit date: 02/08/1954  . Smokeless tobacco: Never Used  . Alcohol use No  . Drug use: No  . Sexual activity: Not Currently   Other Topics Concern  . Not on file   Social History Narrative   Regular Exercise: Yes   Daily Caffeine Use: Sometimes.              Review of Systems: General: negative for chills, fever, night sweats or weight changes.  Cardiovascular: negative for chest pain, dyspnea on exertion, edema, orthopnea, palpitations,  paroxysmal nocturnal dyspnea or shortness of breath Dermatological: negative for rash Respiratory: negative for cough or wheezing Urologic: negative for hematuria Abdominal: negative for nausea, vomiting, diarrhea, bright red blood per rectum, melena, or hematemesis Neurologic: negative for visual changes, syncope, or dizziness All other systems reviewed and are otherwise negative except as noted above.   Physical Exam:  Blood pressure 110/64, pulse 67, height 5' 6.75" (1.695 m), weight 119 lb 6.4 oz (54.2 kg), SpO2 95 %.  General appearance: alert, cooperative and no distress Neck: no carotid bruit and no JVD Lungs: clear to auscultation bilaterally Heart: regular rate and rhythm, S1, S2 normal, no murmur, click, rub or gallop Extremities: extremities normal, atraumatic, no cyanosis or edema Pulses: 2+ and symmetric Skin: Skin color, texture, turgor normal. No rashes or lesions Neurologic: Grossly normal  EKG not performed -- personally reviewed   ASSESSMENT AND PLAN:   1. HFpEF: Echo showed normal LVEF, but patient with recent orthopnea, PND and dyspnea. Symptoms resolved after temporary increase in Lasix dose to 80 mg daily. She is back on regular regimen of 40 mg daily and doing well. BP is stable.  Conitnue current dose. Avoid sodium. Pt will call if any recurrent issues. Can take extra lasix, PRN, however pt advised to check with office prior to doing so.  Follow-Up: continue routine f/u with Dr. Caryl [name] as scheduled. F/u sooner if needed.   Alyssa [name], MHS [facility] 09/30/2016 3:24 PM

## 2016-10-05 ENCOUNTER — Ambulatory Visit (INDEPENDENT_AMBULATORY_CARE_PROVIDER_SITE_OTHER): Payer: Medicare Other | Admitting: *Deleted

## 2016-10-05 DIAGNOSIS — I4821 Permanent atrial fibrillation: Secondary | ICD-10-CM

## 2016-10-05 DIAGNOSIS — Z5181 Encounter for therapeutic drug level monitoring: Secondary | ICD-10-CM

## 2016-10-05 DIAGNOSIS — I482 Chronic atrial fibrillation: Secondary | ICD-10-CM

## 2016-10-05 LAB — POCT INR: INR: 4.8

## 2016-10-08 ENCOUNTER — Telehealth: Payer: Self-pay | Admitting: Internal Medicine

## 2016-10-08 NOTE — Telephone Encounter (Signed)
Eluxadoline (VIBERZI) 75 MG TABS Calling about a PA for this medication. They wanted to have it done before the holiday weekend. I informed PCP is out of office. They faxed the information yesterday.   Please call: [phone] PICK #[phone]

## 2016-10-12 NOTE — Telephone Encounter (Signed)
LVM for pt to call back as soon as possible.   RE: Has pt completed the samples of viberzi and wanted to continue the medication.   Called [employer] and spoke to representative. They confirmed that the patient initiated the PA for the viberzi.  PA started over the phone.

## 2016-10-12 NOTE — Telephone Encounter (Signed)
[employer] called asking if you have received a form for this PA. States this is their 3rd attempt to receive this information and if not received within 24 hours the PA will be denied.  [phone] option 5

## 2016-10-19 ENCOUNTER — Ambulatory Visit (INDEPENDENT_AMBULATORY_CARE_PROVIDER_SITE_OTHER): Payer: Medicare Other | Admitting: *Deleted

## 2016-10-19 DIAGNOSIS — I482 Chronic atrial fibrillation: Secondary | ICD-10-CM

## 2016-10-19 DIAGNOSIS — Z5181 Encounter for therapeutic drug level monitoring: Secondary | ICD-10-CM | POA: Diagnosis not present

## 2016-10-19 DIAGNOSIS — I4821 Permanent atrial fibrillation: Secondary | ICD-10-CM

## 2016-10-19 LAB — POCT INR: INR: 1.7

## 2016-10-28 ENCOUNTER — Other Ambulatory Visit: Payer: Self-pay | Admitting: Internal Medicine

## 2016-10-29 ENCOUNTER — Other Ambulatory Visit: Payer: Self-pay | Admitting: Cardiology

## 2016-10-29 MED ORDER — FUROSEMIDE 40 MG PO TABS: 40.0000 mg | ORAL_TABLET | Freq: Every day | ORAL | 10 refills | Status: DC

## 2016-11-01 ENCOUNTER — Telehealth: Payer: Self-pay | Admitting: Internal Medicine

## 2016-11-01 DIAGNOSIS — K580 Irritable bowel syndrome with diarrhea: Secondary | ICD-10-CM

## 2016-11-01 NOTE — Telephone Encounter (Signed)
I will ask the rep to work on this at her local pharmacy

## 2016-11-01 NOTE — Telephone Encounter (Signed)
Pt states that she does need PA -   Pt states that even if the PA was approved it still would be 500-700/month. Pt is not able to approve - Is there an alternative that is as good?

## 2016-11-01 NOTE — Telephone Encounter (Signed)
Pt called asking to speak with you regarding some samples she was given. She can be reached at [phone].

## 2016-11-02 MED ORDER — ELUXADOLINE 75 MG PO TABS: 1.0000 | ORAL_TABLET | Freq: Two times a day (BID) | ORAL | 1 refills | Status: DC

## 2016-11-02 NOTE — Addendum Note (Signed)
Addended by: Aviva Signs M on: 11/02/2016 01:46 PM   Modules accepted: Orders

## 2016-11-03 ENCOUNTER — Ambulatory Visit (INDEPENDENT_AMBULATORY_CARE_PROVIDER_SITE_OTHER): Payer: Medicare Other | Admitting: Pharmacist

## 2016-11-03 DIAGNOSIS — Z5181 Encounter for therapeutic drug level monitoring: Secondary | ICD-10-CM | POA: Diagnosis not present

## 2016-11-03 DIAGNOSIS — I482 Chronic atrial fibrillation: Secondary | ICD-10-CM | POA: Diagnosis not present

## 2016-11-03 DIAGNOSIS — I4891 Unspecified atrial fibrillation: Secondary | ICD-10-CM | POA: Diagnosis not present

## 2016-11-03 DIAGNOSIS — I4821 Permanent atrial fibrillation: Secondary | ICD-10-CM

## 2016-11-03 LAB — POCT INR: INR: 1.5

## 2016-11-03 NOTE — Telephone Encounter (Signed)
Pt informed of med being called in, will call pharmacy to find out the charge.

## 2016-11-04 ENCOUNTER — Telehealth: Payer: Self-pay

## 2016-11-04 NOTE — Telephone Encounter (Signed)
Key: [id number]

## 2016-11-04 NOTE — Telephone Encounter (Signed)
Closing note. New note with PA information.

## 2016-11-23 ENCOUNTER — Ambulatory Visit (INDEPENDENT_AMBULATORY_CARE_PROVIDER_SITE_OTHER): Payer: Medicare Other | Admitting: *Deleted

## 2016-11-23 DIAGNOSIS — Z5181 Encounter for therapeutic drug level monitoring: Secondary | ICD-10-CM

## 2016-11-23 DIAGNOSIS — I4891 Unspecified atrial fibrillation: Secondary | ICD-10-CM

## 2016-11-23 DIAGNOSIS — I4821 Permanent atrial fibrillation: Secondary | ICD-10-CM

## 2016-11-23 DIAGNOSIS — I482 Chronic atrial fibrillation: Secondary | ICD-10-CM

## 2016-11-23 DIAGNOSIS — I071 Rheumatic tricuspid insufficiency: Secondary | ICD-10-CM | POA: Diagnosis not present

## 2016-11-23 LAB — POCT INR: INR: 2.4

## 2016-11-25 ENCOUNTER — Ambulatory Visit (INDEPENDENT_AMBULATORY_CARE_PROVIDER_SITE_OTHER): Payer: Medicare Other | Admitting: Internal Medicine

## 2016-11-25 VITALS — BP 110/64 | HR 72 | Temp 97.7°F | Ht 66.75 in | Wt 125.0 lb

## 2016-11-25 DIAGNOSIS — Z23 Encounter for immunization: Secondary | ICD-10-CM

## 2016-11-25 DIAGNOSIS — J301 Allergic rhinitis due to pollen: Secondary | ICD-10-CM

## 2016-11-25 DIAGNOSIS — H6983 Other specified disorders of Eustachian tube, bilateral: Secondary | ICD-10-CM

## 2016-11-25 MED ORDER — METHYLPREDNISOLONE 4 MG PO TBPK: ORAL_TABLET | ORAL | 0 refills | Status: AC

## 2016-11-25 NOTE — Progress Notes (Signed)
Subjective:  Patient ID: Alyssa [name], female    DOB: 1924-02-27  Age: 83 y.o. MRN: [id number]  CC: Allergic Rhinitis    HPI Alyssa [name] presents for a 1 month hx of popping and pressure in both ears with nasal congestion, runny nose, and PND.  Outpatient Medications Prior to Visit  Medication Sig Dispense Refill  . acetaminophen (TYLENOL ARTHRITIS PAIN) 650 MG CR tablet Take 1,300 mg by mouth 2 (two) times daily.      . Eluxadoline (VIBERZI) 75 MG TABS Take 1 tablet by mouth 2 (two) times daily. 180 tablet 1  . furosemide (LASIX) 40 MG tablet Take 1 tablet (40 mg total) by mouth daily. 30 tablet 10  . irbesartan (AVAPRO) 150 MG tablet TAKE ONE TABLET BY MOUTH ONCE DAILY 90 tablet 1  . levothyroxine (SYNTHROID, LEVOTHROID) 88 MCG tablet TAKE ONE TABLET BY MOUTH ONCE DAILY 90 tablet 1  . warfarin (COUMADIN) 3 MG tablet TAKE AS DIRECTED BY COUMADIN CLINIC. 30 tablet 3  . potassium chloride (K-DUR) 10 MEQ tablet Take 2 tablets (20 mEq total) by mouth daily. 60 tablet 3   No facility-administered medications prior to visit.     ROS Review of Systems  Constitutional: Negative for chills, fatigue and fever.  HENT: Positive for congestion, ear pain, postnasal drip, rhinorrhea and sneezing. Negative for facial swelling, hearing loss, nosebleeds, sinus pain, sinus pressure, sore throat, trouble swallowing and voice change.   Eyes: Negative.   Respiratory: Negative.  Negative for cough, chest tightness, shortness of breath and wheezing.   Cardiovascular: Negative for chest pain, palpitations and leg swelling.  Gastrointestinal: Negative.  Negative for abdominal pain, constipation, diarrhea, nausea and vomiting.  Genitourinary: Negative.   Musculoskeletal: Negative.  Negative for back pain and myalgias.  Skin: Negative.  Negative for color change and rash.  Hematological: Negative.  Negative for adenopathy. Does not bruise/bleed easily.  Psychiatric/Behavioral: Negative.     Objective:    BP 110/64 (BP Location: Left Arm, Patient Position: Sitting, Cuff Size: Normal)   Pulse 72   Temp 97.7 F (36.5 C) (Oral)   Ht 5' 6.75" (1.695 m)   Wt 125 lb (56.7 kg)   SpO2 98%   BMI 19.72 kg/m   BP Readings from Last 3 Encounters:  11/25/16 110/64  09/30/16 110/64  09/21/16 120/62    Wt Readings from Last 3 Encounters:  11/25/16 125 lb (56.7 kg)  09/30/16 119 lb 6.4 oz (54.2 kg)  09/21/16 120 lb (54.4 kg)    Physical Exam  Constitutional: She is oriented to person, place, and time. No distress.  HENT:  Right Ear: Hearing, tympanic membrane, external ear and ear canal normal.  Left Ear: Hearing, tympanic membrane, external ear and ear canal normal.  Nose: Mucosal edema present. No rhinorrhea. Right sinus exhibits no maxillary sinus tenderness and no frontal sinus tenderness. Left sinus exhibits no maxillary sinus tenderness and no frontal sinus tenderness.  Mouth/Throat: Oropharynx is clear and moist. No oropharyngeal exudate.  Eyes: Conjunctivae are normal. Right eye exhibits no discharge. Left eye exhibits no discharge. No scleral icterus.  Neck: Normal range of motion. Neck supple. No JVD present. No thyromegaly present.  Cardiovascular: Normal rate, regular rhythm and intact distal pulses.  Exam reveals no gallop and no friction rub.   No murmur heard. Pulmonary/Chest: Effort normal and breath sounds normal. No respiratory distress. She has no wheezes. She has no rales. She exhibits no tenderness.  Abdominal: Soft. Bowel sounds are normal. She  exhibits no distension and no mass. There is no tenderness. There is no rebound and no guarding.  Musculoskeletal: Normal range of motion.  Lymphadenopathy:    She has no cervical adenopathy.  Neurological: She is alert and oriented to person, place, and time.  Skin: Skin is warm and dry. No rash noted. She is not diaphoretic. No erythema. No pallor.  Vitals reviewed.   Lab Results  Component Value Date   WBC 5.1 08/13/2016    HGB 12.3 08/13/2016   HCT 37.4 08/13/2016   PLT 204 08/13/2016   GLUCOSE 86 09/01/2016   CHOL 155 02/11/2014   TRIG 84.0 02/11/2014   HDL 46.10 02/11/2014   LDLCALC 92 02/11/2014   ALT 16 02/09/2016   AST 23 02/09/2016   NA 143 09/01/2016   K 4.9 09/01/2016   CL 102 09/01/2016   CREATININE 1.16 (H) 09/01/2016   BUN 29 09/01/2016   CO2 25 09/01/2016   TSH 1.010 08/13/2016   INR 2.4 11/23/2016   HGBA1C 5.9 06/09/2012    Dg Chest 2 View  Result Date: 09/01/2016 CLINICAL DATA:  Shortness of breath EXAM: CHEST  2 VIEW COMPARISON:  05/04/2016, 12/15/2015 FINDINGS: Left-sided dual lead pacing device as before. Postsurgical changes at the right axilla. Hyperinflation. No focal infiltrate or effusion. Moderate cardiomegaly with atherosclerosis. No pneumothorax. Stable compression deformity at approximate T12 level. IMPRESSION: Hyperinflation.  No acute infiltrate.  Cardiomegaly without edema Electronically Signed   By: Donavan Foil M.D.   On: 09/01/2016 17:05    Assessment & Plan:   Alyssa [name] was seen today for allergic rhinitis .  Diagnoses and all orders for this visit:  Need for influenza vaccination -     Flu vaccine HIGH DOSE PF (Fluzone High dose)  Seasonal allergic rhinitis due to pollen-she is not willing to use a NS spray, will try a course of systemic steroids. She will also start Zyrtec -     methylPREDNISolone (MEDROL DOSEPAK) 4 MG TBPK tablet; TAKE AS DIRECTED  Eustachian tube dysfunction, bilateral- as above -     methylPREDNISolone (MEDROL DOSEPAK) 4 MG TBPK tablet; TAKE AS DIRECTED   I am having Alyssa [name] start on methylPREDNISolone. I am also having her maintain her acetaminophen, levothyroxine, potassium chloride, warfarin, irbesartan, furosemide, and Eluxadoline.  Meds ordered this encounter  Medications  . methylPREDNISolone (MEDROL DOSEPAK) 4 MG TBPK tablet    Sig: TAKE AS DIRECTED    Dispense:  21 tablet    Refill:  0     Follow-up: Return if symptoms  worsen or fail to improve.  [name], MD

## 2016-11-25 NOTE — Patient Instructions (Signed)
Eustachian Tube Dysfunction The eustachian tube connects the middle ear to the back of the nose. It regulates air pressure in the middle ear by allowing air to move between the ear and nose. It also helps to drain fluid from the middle ear space. When the eustachian tube does not function properly, air pressure, fluid, or both can build up in the middle ear. Eustachian tube dysfunction can affect one or both ears. What are the causes? This condition happens when the eustachian tube becomes blocked or cannot open normally. This may result from:  Ear infections.  Colds and other upper respiratory infections.  Allergies.  Irritation, such as from cigarette smoke or acid from the stomach coming up into the esophagus (gastroesophageal reflux).  Sudden changes in air pressure, such as from descending in an airplane.  Abnormal growths in the nose or throat, such as nasal polyps, tumors, or enlarged tissue at the back of the throat (adenoids).  What increases the risk? This condition may be more likely to develop in people who smoke and people who are overweight. Eustachian tube dysfunction may also be more likely to develop in children, especially children who have:  Certain birth defects of the mouth, such as cleft palate.  Large tonsils and adenoids.  What are the signs or symptoms? Symptoms of this condition may include:  A feeling of fullness in the ear.  Ear pain.  Clicking or popping noises in the ear.  Ringing in the ear.  Hearing loss.  Loss of balance.  Symptoms may get worse when the air pressure around you changes, such as when you travel to an area of high elevation or fly on an airplane. How is this diagnosed? This condition may be diagnosed based on:  Your symptoms.  A physical exam of your ear, nose, and throat.  Tests, such as those that measure: ? The movement of your eardrum (tympanogram). ? Your hearing (audiometry).  How is this treated? Treatment  depends on the cause and severity of your condition. If your symptoms are mild, you may be able to relieve your symptoms by moving air into ("popping") your ears. If you have symptoms of fluid in your ears, treatment may include:  Decongestants.  Antihistamines.  Nasal sprays or ear drops that contain medicines that reduce swelling (steroids).  In some cases, you may need to have a procedure to drain the fluid in your eardrum (myringotomy). In this procedure, a small tube is placed in the eardrum to:  Drain the fluid.  Restore the air in the middle ear space.  Follow these instructions at home:  Take over-the-counter and prescription medicines only as told by your health care provider.  Use techniques to help pop your ears as recommended by your health care provider. These may include: ? Chewing gum. ? Yawning. ? Frequent, forceful swallowing. ? Closing your mouth, holding your nose closed, and gently blowing as if you are trying to blow air out of your nose.  Do not do any of the following until your health care provider approves: ? Travel to high altitudes. ? Fly in airplanes. ? Work in a pressurized cabin or room. ? Scuba dive.  Keep your ears dry. Dry your ears completely after showering or bathing.  Do not smoke.  Keep all follow-up visits as told by your health care provider. This is important. Contact a health care provider if:  Your symptoms do not go away after treatment.  Your symptoms come back after treatment.  You are   unable to pop your ears.  You have: ? A fever. ? Pain in your ear. ? Pain in your head or neck. ? Fluid draining from your ear.  Your hearing suddenly changes.  You become very dizzy.  You lose your balance. This information is not intended to replace advice given to you by your health care provider. Make sure you discuss any questions you have with your health care provider. Document Released: 02/21/2015 Document Revised: 07/03/2015  Document Reviewed: 02/13/2014 Elsevier Interactive Patient Education  2018 Elsevier Inc.  

## 2016-11-30 IMAGING — CT CT ABD-PELV W/O CM
2 of 4 series · 16 of 46 positions shown, 18 images · non-contrast
Comparison: None.

CLINICAL DATA: Intermittent diarrhea x3 months, history of
diverticulitis.

EXAM:
CT ABDOMEN AND PELVIS WITHOUT CONTRAST
TECHNIQUE: Multidetector CT imaging of the abdomen and pelvis was performed
following the standard protocol without IV contrast.

[Series 2: ap without · axial · non-contrast · 0.71mm/px · z∈[+713,+1078]mm · 13 of 81 slices shown, 15 images]
[im 4/81  soft-tissue]
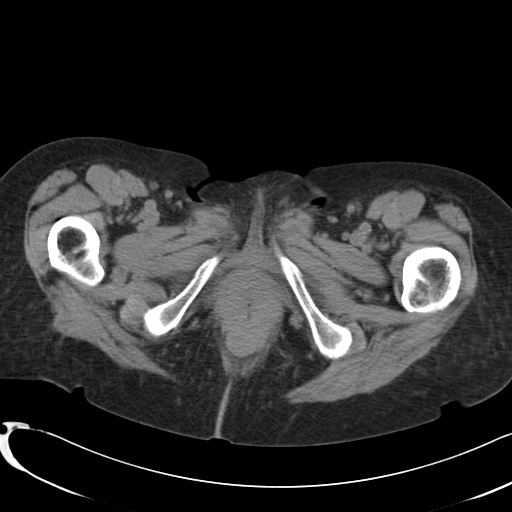
[im 4/81  bone]
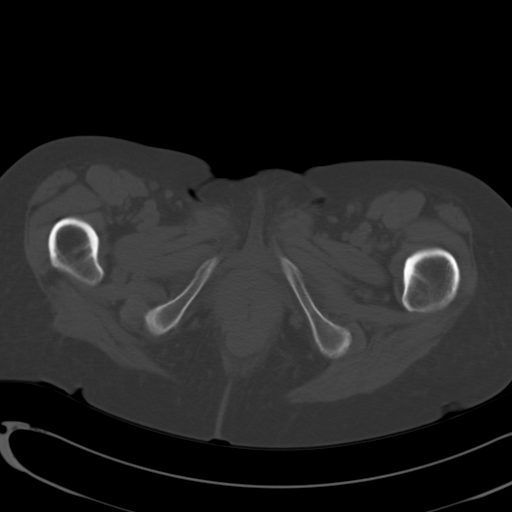
[im 11/81  soft-tissue]
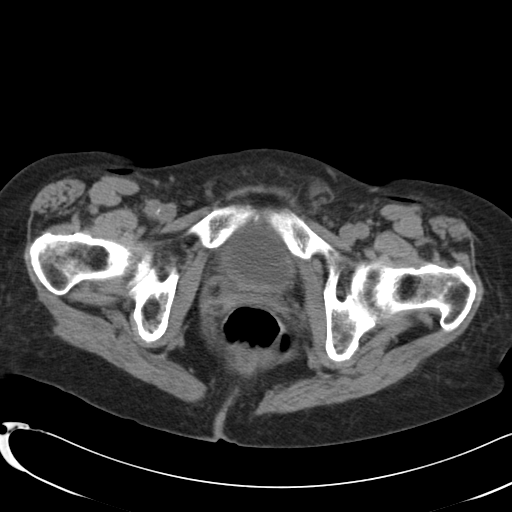
[im 17/81  soft-tissue]
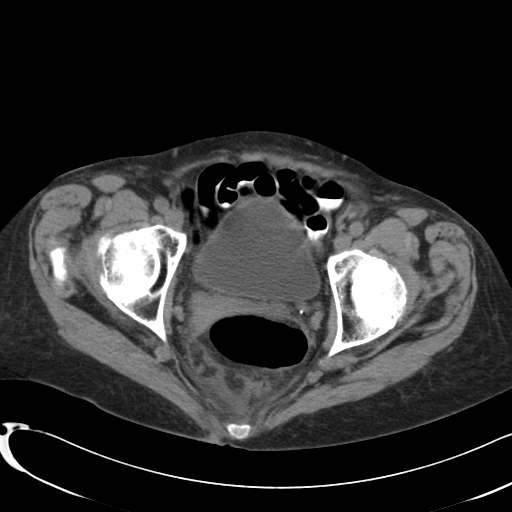
[im 24/81  soft-tissue]
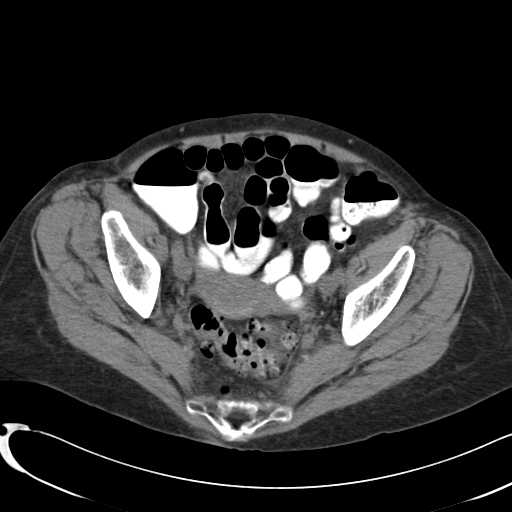
[im 27/81  soft-tissue]
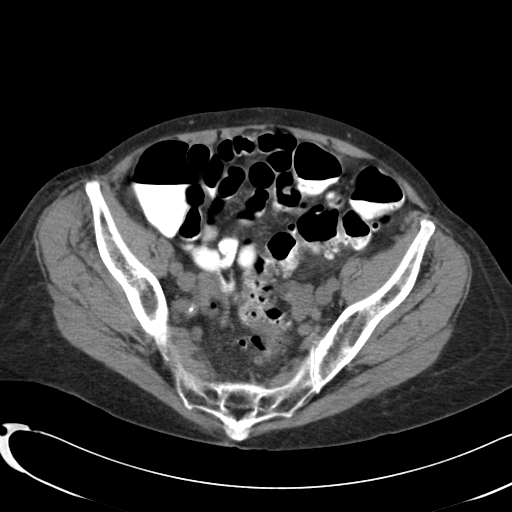
[im 34/81  soft-tissue]
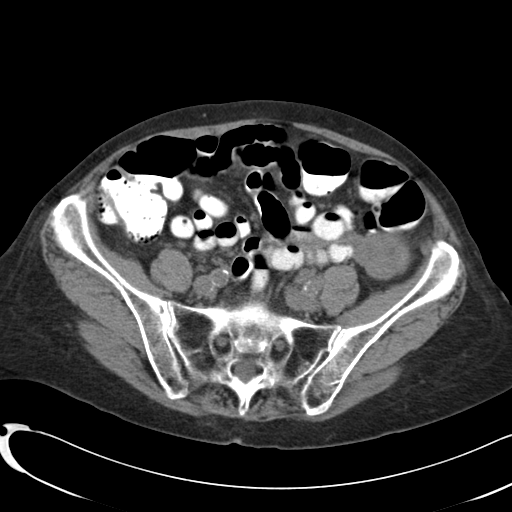
[im 41/81  soft-tissue]
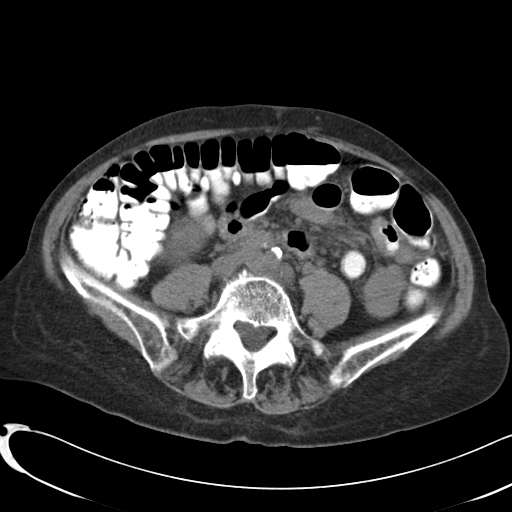
[im 47/81  soft-tissue]
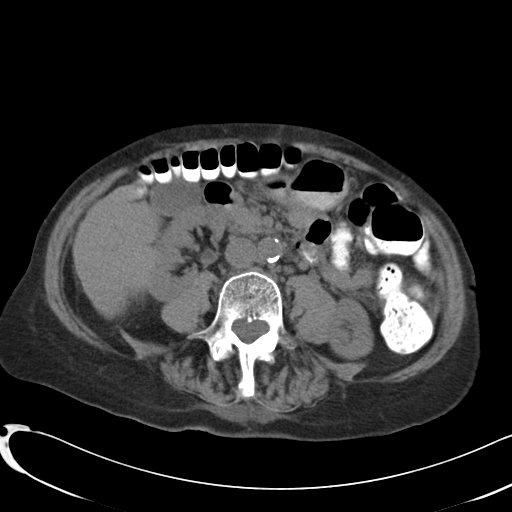
[im 54/81  soft-tissue]
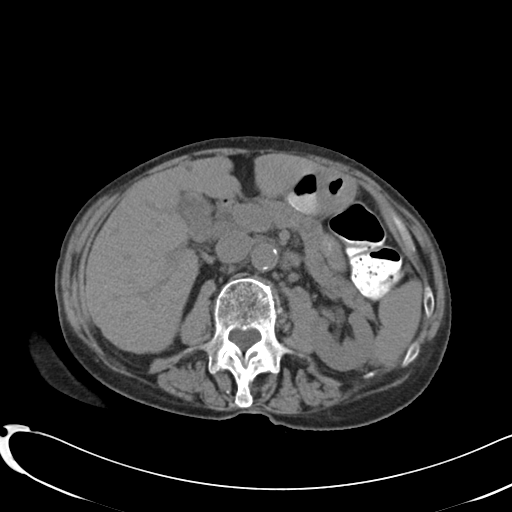
[im 54/81  bone]
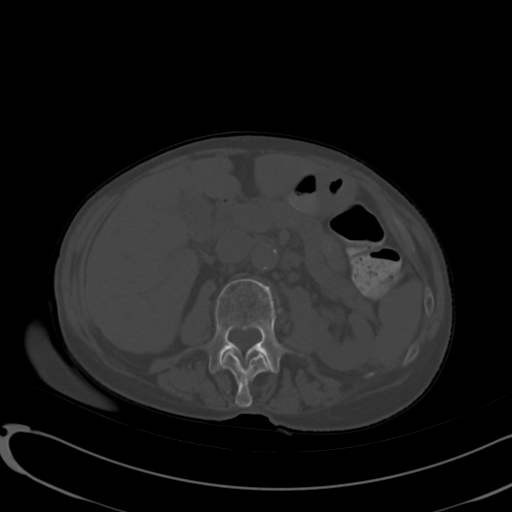
[im 57/81  soft-tissue]
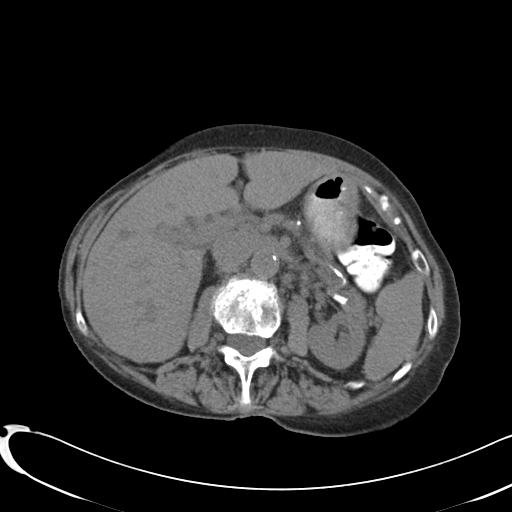
[im 64/81  soft-tissue]
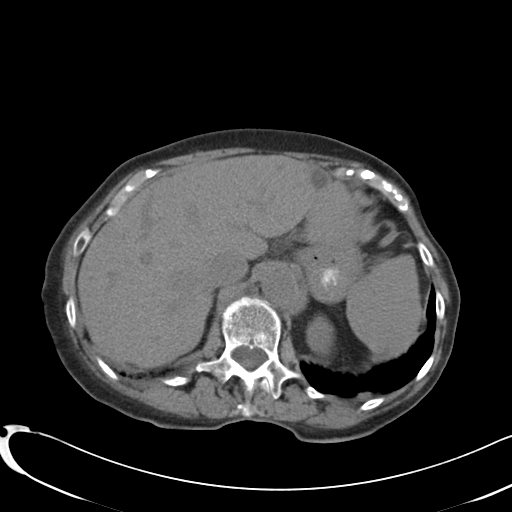
[im 71/81  soft-tissue]
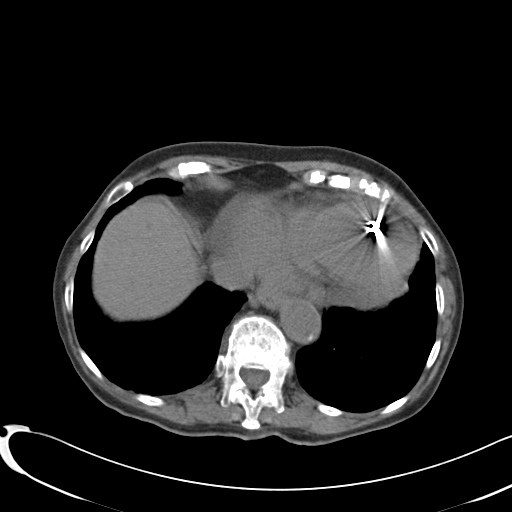
[im 77/81  soft-tissue]
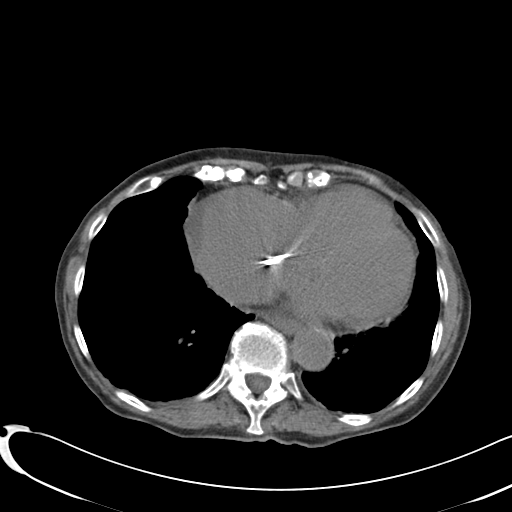

[Series 602: coronal · coronal · 0.82mm/px · 3 of 76 slices shown]
[im 26/76  soft-tissue]
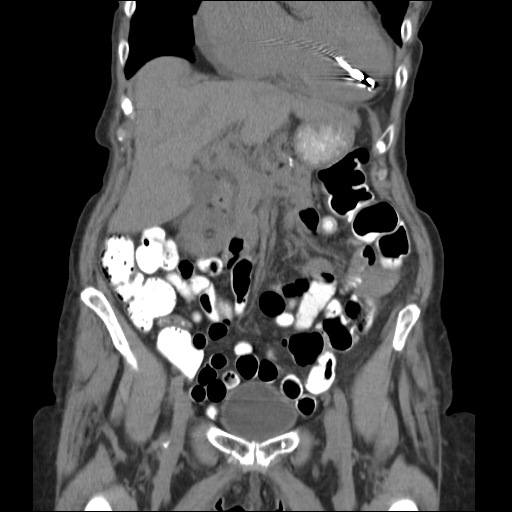
[im 34/76  soft-tissue]
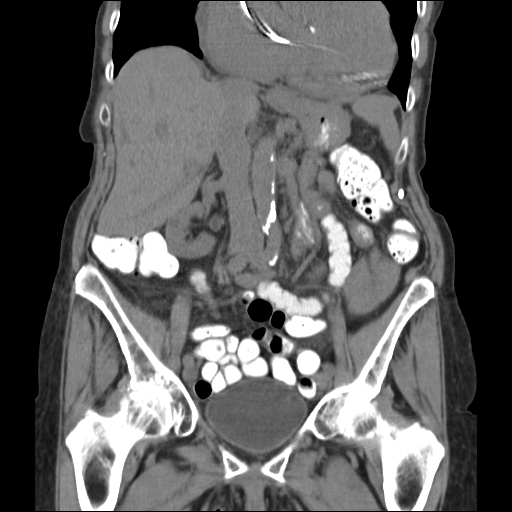
[im 42/76  soft-tissue]
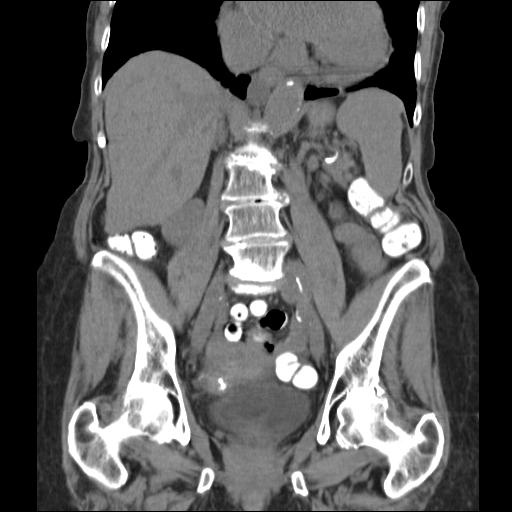

[16 of 46 positions shown; findings below may reference images not displayed]

FINDINGS: Lower chest:  Lung bases are clear.

Cardiomegaly.  Pacemaker leads, incompletely visualized.

Hepatobiliary: 5 mm cyst inferiorly in the medial segment left
hepatic lobe (series 2/ image 28). Unenhanced liver is otherwise
grossly unremarkable.

Gallbladder is unremarkable. No intrahepatic or extrahepatic ductal
dilatation.

Pancreas: Within normal limits.

Spleen: Within normal limits.

Adrenals/Urinary Tract: Adrenal glands are within normal limits.

Kidneys are within normal limits.

No renal, ureteral, or bladder calculi.  No hydronephrosis.

Bladder is within normal limits.

Stomach/Bowel: Stomach is within normal limits.

No evidence of bowel obstruction.

Normal appendix.

Sigmoid diverticulosis, without evidence of diverticulitis.

Vascular/Lymphatic: Atherosclerotic calcifications of the abdominal
aorta and branch vessels.

No suspicious abdominopelvic lymphadenopathy.

Reproductive: Uterus is notable for a probable calcified right
uterine body fibroid (series 2/ image 62).

No adnexal masses.

Other: Trace pelvic ascites.

1.9 x 2.3 cm fluid density lesion in the right retrocrural lesion,
possibly reflecting a duplication cyst or lymphocele, likely benign.

Musculoskeletal: Degenerative changes of the visualized
thoracolumbar spine.

Mild to moderate superior endplate compression fracture deformity at
T12, age indeterminate. No retropulsion.
IMPRESSION: Sigmoid diverticulosis, without evidence of diverticulitis.

No evidence of bowel obstruction.  Normal appendix.

Trace pelvic ascites, nonspecific.

Mild to moderate superior endplate compression fracture deformity at
T12, age indeterminate. No retropulsion.

## 2016-12-04 ENCOUNTER — Other Ambulatory Visit: Payer: Self-pay | Admitting: Internal Medicine

## 2016-12-04 DIAGNOSIS — E038 Other specified hypothyroidism: Secondary | ICD-10-CM

## 2016-12-06 ENCOUNTER — Telehealth: Payer: Self-pay | Admitting: Internal Medicine

## 2016-12-06 ENCOUNTER — Ambulatory Visit (INDEPENDENT_AMBULATORY_CARE_PROVIDER_SITE_OTHER): Payer: Medicare Other | Admitting: *Deleted

## 2016-12-06 DIAGNOSIS — I442 Atrioventricular block, complete: Secondary | ICD-10-CM

## 2016-12-06 NOTE — Telephone Encounter (Signed)
Informed patient that remote transmission was received. Patient verbalized understanding. 

## 2016-12-06 NOTE — Progress Notes (Signed)
Remote pacemaker transmission.   

## 2016-12-06 NOTE — Telephone Encounter (Signed)
New Message ° ° ° °1. Has your device fired? no ° °2. Is you device beeping? no ° °3. Are you experiencing draining or swelling at device site? no ° °4. Are you calling to see if we received your device transmission? yes ° °5. Have you passed out? no ° ° ° °Please route to Device Clinic Pool ° °

## 2016-12-07 LAB — CUP PACEART REMOTE DEVICE CHECK
Battery Impedance: 223 Ohm
Battery Remaining Longevity: 107 mo
Brady Statistic RV Percent Paced: 99 %
Implantable Lead Location: 753859
Implantable Pulse Generator Implant Date: 20170118
Lead Channel Impedance Value: 541 Ohm
Lead Channel Impedance Value: 67 Ohm
Lead Channel Pacing Threshold Amplitude: 0.625 V
Lead Channel Pacing Threshold Pulse Width: 0.4 ms
Lead Channel Setting Pacing Amplitude: 2.5 V
Lead Channel Setting Pacing Pulse Width: 0.4 ms
MDC IDC LEAD IMPLANT DT: 20000921
MDC IDC LEAD IMPLANT DT: 20000921
MDC IDC LEAD LOCATION: 753860
MDC IDC MSMT BATTERY VOLTAGE: 2.79 V
MDC IDC SESS DTM: 20181029154128
MDC IDC SET LEADCHNL RV SENSING SENSITIVITY: 2 mV

## 2016-12-14 ENCOUNTER — Encounter: Payer: Self-pay | Admitting: Cardiology

## 2016-12-14 ENCOUNTER — Ambulatory Visit (INDEPENDENT_AMBULATORY_CARE_PROVIDER_SITE_OTHER): Payer: Medicare Other | Admitting: *Deleted

## 2016-12-14 DIAGNOSIS — I482 Chronic atrial fibrillation: Secondary | ICD-10-CM

## 2016-12-14 DIAGNOSIS — Z5181 Encounter for therapeutic drug level monitoring: Secondary | ICD-10-CM

## 2016-12-14 DIAGNOSIS — I4821 Permanent atrial fibrillation: Secondary | ICD-10-CM

## 2016-12-14 LAB — POCT INR: INR: 2.2

## 2016-12-15 DIAGNOSIS — H16223 Keratoconjunctivitis sicca, not specified as Sjogren's, bilateral: Secondary | ICD-10-CM | POA: Diagnosis not present

## 2016-12-21 DIAGNOSIS — H16223 Keratoconjunctivitis sicca, not specified as Sjogren's, bilateral: Secondary | ICD-10-CM | POA: Diagnosis not present

## 2016-12-23 ENCOUNTER — Telehealth: Payer: Self-pay | Admitting: Internal Medicine

## 2016-12-23 NOTE — Telephone Encounter (Signed)
New message    Patient calling to inform staff of new medications she is taking.  Eyedrops- Tobrex Eyedrops-Prednisolone Acetate

## 2016-12-23 NOTE — Telephone Encounter (Signed)
Spoke with patient in regards to her starting on new eyedrops for the next 7 days.  I verified with Jinny Blossom, pharmacist that these were all compatible.  I left patient message affirming compatibility.

## 2017-01-12 ENCOUNTER — Other Ambulatory Visit: Payer: Self-pay | Admitting: Internal Medicine

## 2017-01-13 ENCOUNTER — Ambulatory Visit (INDEPENDENT_AMBULATORY_CARE_PROVIDER_SITE_OTHER): Payer: Medicare Other | Admitting: Pharmacist

## 2017-01-13 DIAGNOSIS — I482 Chronic atrial fibrillation: Secondary | ICD-10-CM

## 2017-01-13 DIAGNOSIS — Z5181 Encounter for therapeutic drug level monitoring: Secondary | ICD-10-CM | POA: Diagnosis not present

## 2017-01-13 DIAGNOSIS — I4821 Permanent atrial fibrillation: Secondary | ICD-10-CM

## 2017-01-13 LAB — POCT INR: INR: 1.5

## 2017-01-13 NOTE — Patient Instructions (Signed)
Take an extra half tablet today and tomorrow, then continue taking 1 tablet daily except 1/2 tablet on Mondays and Fridays. Recheck in 2 weeks. Call with any new or different medications [phone]

## 2017-01-27 ENCOUNTER — Ambulatory Visit (INDEPENDENT_AMBULATORY_CARE_PROVIDER_SITE_OTHER): Payer: Medicare Other | Admitting: Pharmacist

## 2017-01-27 DIAGNOSIS — Z5181 Encounter for therapeutic drug level monitoring: Secondary | ICD-10-CM | POA: Diagnosis not present

## 2017-01-27 DIAGNOSIS — I482 Chronic atrial fibrillation: Secondary | ICD-10-CM

## 2017-01-27 DIAGNOSIS — I4821 Permanent atrial fibrillation: Secondary | ICD-10-CM

## 2017-01-27 LAB — POCT INR: INR: 1.7

## 2017-01-27 NOTE — Patient Instructions (Signed)
Description   Take 1.5 tablets today, then start taking 1 tablet daily except 1/2 tablet on Fridays. Recheck in 2 weeks. Call with any new or different medications [phone]

## 2017-02-10 ENCOUNTER — Ambulatory Visit (INDEPENDENT_AMBULATORY_CARE_PROVIDER_SITE_OTHER): Payer: Medicare Other

## 2017-02-10 DIAGNOSIS — Z5181 Encounter for therapeutic drug level monitoring: Secondary | ICD-10-CM

## 2017-02-10 DIAGNOSIS — I4891 Unspecified atrial fibrillation: Secondary | ICD-10-CM

## 2017-02-10 LAB — POCT INR: INR: 2.4

## 2017-02-10 NOTE — Patient Instructions (Signed)
Description   Continue on same dosage 1 tablet daily except 1/2 tablet on Fridays. Recheck in 3 weeks. Call with any new or different medications [phone]

## 2017-03-03 ENCOUNTER — Ambulatory Visit (INDEPENDENT_AMBULATORY_CARE_PROVIDER_SITE_OTHER): Payer: Medicare Other | Admitting: *Deleted

## 2017-03-03 DIAGNOSIS — I4891 Unspecified atrial fibrillation: Secondary | ICD-10-CM

## 2017-03-03 DIAGNOSIS — Z5181 Encounter for therapeutic drug level monitoring: Secondary | ICD-10-CM | POA: Diagnosis not present

## 2017-03-03 LAB — POCT INR: INR: 2.2

## 2017-03-03 NOTE — Patient Instructions (Signed)
Description   Continue on same dosage 1 tablet daily except 1/2 tablet on Fridays. Recheck in 4 weeks. Call with any new or different medications #[phone]     

## 2017-03-07 ENCOUNTER — Telehealth: Payer: Self-pay | Admitting: Internal Medicine

## 2017-03-07 ENCOUNTER — Ambulatory Visit (INDEPENDENT_AMBULATORY_CARE_PROVIDER_SITE_OTHER): Payer: Medicare Other | Admitting: *Deleted

## 2017-03-07 DIAGNOSIS — I442 Atrioventricular block, complete: Secondary | ICD-10-CM

## 2017-03-07 NOTE — Progress Notes (Signed)
Remote pacemaker transmission.   

## 2017-03-07 NOTE — Telephone Encounter (Signed)
New Message ° ° ° °1. Has your device fired? no ° °2. Is you device beeping? no ° °3. Are you experiencing draining or swelling at device site? no ° °4. Are you calling to see if we received your device transmission? yes ° °5. Have you passed out? no ° ° ° °Please route to Device Clinic Pool ° °

## 2017-03-07 NOTE — Telephone Encounter (Signed)
Informed patient that remote was received. Patient verbalized understanding.

## 2017-03-08 LAB — CUP PACEART REMOTE DEVICE CHECK
Battery Remaining Longevity: 105 mo
Date Time Interrogation Session: 20190128143553
Implantable Lead Implant Date: 20000921
Implantable Pulse Generator Implant Date: 20170118
Lead Channel Pacing Threshold Amplitude: 0.75 V
Lead Channel Pacing Threshold Pulse Width: 0.4 ms
MDC IDC LEAD IMPLANT DT: 20000921
MDC IDC LEAD LOCATION: 753859
MDC IDC LEAD LOCATION: 753860
MDC IDC MSMT BATTERY IMPEDANCE: 247 Ohm
MDC IDC MSMT BATTERY VOLTAGE: 2.79 V
MDC IDC MSMT LEADCHNL RA IMPEDANCE VALUE: 67 Ohm
MDC IDC MSMT LEADCHNL RV IMPEDANCE VALUE: 575 Ohm
MDC IDC SET LEADCHNL RV PACING AMPLITUDE: 2.5 V
MDC IDC SET LEADCHNL RV PACING PULSEWIDTH: 0.4 ms
MDC IDC SET LEADCHNL RV SENSING SENSITIVITY: 2 mV
MDC IDC STAT BRADY RV PERCENT PACED: 99 %

## 2017-03-09 ENCOUNTER — Encounter: Payer: Self-pay | Admitting: Cardiology

## 2017-03-10 ENCOUNTER — Telehealth: Payer: Self-pay | Admitting: Internal Medicine

## 2017-03-10 NOTE — Telephone Encounter (Signed)
Joey with [facility] states they are out of pt.'s generic Synthroid and wants to switch generic companies. Gave him permission to do this.

## 2017-03-23 ENCOUNTER — Other Ambulatory Visit: Payer: Self-pay | Admitting: Internal Medicine

## 2017-03-23 DIAGNOSIS — I071 Rheumatic tricuspid insufficiency: Secondary | ICD-10-CM

## 2017-03-23 DIAGNOSIS — I10 Essential (primary) hypertension: Secondary | ICD-10-CM

## 2017-03-28 ENCOUNTER — Other Ambulatory Visit: Payer: Self-pay | Admitting: Internal Medicine

## 2017-03-28 DIAGNOSIS — I10 Essential (primary) hypertension: Secondary | ICD-10-CM

## 2017-03-28 DIAGNOSIS — I5030 Unspecified diastolic (congestive) heart failure: Secondary | ICD-10-CM

## 2017-03-28 MED ORDER — OLMESARTAN MEDOXOMIL 20 MG PO TABS: 20.0000 mg | ORAL_TABLET | Freq: Every day | ORAL | 1 refills | Status: DC

## 2017-03-30 ENCOUNTER — Other Ambulatory Visit: Payer: Self-pay | Admitting: Internal Medicine

## 2017-03-30 DIAGNOSIS — I10 Essential (primary) hypertension: Secondary | ICD-10-CM

## 2017-03-30 DIAGNOSIS — I5030 Unspecified diastolic (congestive) heart failure: Secondary | ICD-10-CM

## 2017-03-30 MED ORDER — AZILSARTAN MEDOXOMIL 40 MG PO TABS: 1.0000 | ORAL_TABLET | Freq: Every day | ORAL | 1 refills | Status: DC

## 2017-03-31 ENCOUNTER — Other Ambulatory Visit: Payer: Self-pay | Admitting: Internal Medicine

## 2017-03-31 DIAGNOSIS — I5030 Unspecified diastolic (congestive) heart failure: Secondary | ICD-10-CM

## 2017-03-31 DIAGNOSIS — I10 Essential (primary) hypertension: Secondary | ICD-10-CM

## 2017-03-31 MED ORDER — LOSARTAN POTASSIUM 100 MG PO TABS: 100.0000 mg | ORAL_TABLET | Freq: Every day | ORAL | 1 refills | Status: DC

## 2017-04-11 ENCOUNTER — Ambulatory Visit (INDEPENDENT_AMBULATORY_CARE_PROVIDER_SITE_OTHER): Payer: Medicare Other | Admitting: Pharmacist

## 2017-04-11 DIAGNOSIS — I4891 Unspecified atrial fibrillation: Secondary | ICD-10-CM | POA: Diagnosis not present

## 2017-04-11 DIAGNOSIS — Z5181 Encounter for therapeutic drug level monitoring: Secondary | ICD-10-CM

## 2017-04-11 LAB — POCT INR: INR: 2.6

## 2017-04-11 NOTE — Patient Instructions (Signed)
Description   Continue on same dosage 1 tablet daily except 1/2 tablet on Fridays. Recheck in 6 weeks. Call with any new or different medications #[phone]      

## 2017-04-19 ENCOUNTER — Ambulatory Visit: Payer: Medicare Other | Admitting: Internal Medicine

## 2017-04-21 ENCOUNTER — Encounter: Payer: Self-pay | Admitting: Internal Medicine

## 2017-04-21 ENCOUNTER — Ambulatory Visit (INDEPENDENT_AMBULATORY_CARE_PROVIDER_SITE_OTHER)
Admission: RE | Admit: 2017-04-21 | Discharge: 2017-04-21 | Disposition: A | Discharge status: Home / Self Care | Payer: Medicare Other | Source: Ambulatory Visit | Attending: Internal Medicine | Admitting: Internal Medicine

## 2017-04-21 ENCOUNTER — Ambulatory Visit: Payer: Medicare Other | Admitting: Internal Medicine

## 2017-04-21 VITALS — BP 110/60 | HR 89 | Temp 97.9°F | Resp 16 | Ht 66.0 in | Wt 130.0 lb

## 2017-04-21 DIAGNOSIS — R059 Cough, unspecified: Secondary | ICD-10-CM

## 2017-04-21 DIAGNOSIS — R05 Cough: Secondary | ICD-10-CM

## 2017-04-21 DIAGNOSIS — J4521 Mild intermittent asthma with (acute) exacerbation: Secondary | ICD-10-CM

## 2017-04-21 DIAGNOSIS — J45909 Unspecified asthma, uncomplicated: Secondary | ICD-10-CM | POA: Insufficient documentation

## 2017-04-21 DIAGNOSIS — J988 Other specified respiratory disorders: Secondary | ICD-10-CM

## 2017-04-21 LAB — POCT EXHALED NITRIC OXIDE: FENO LEVEL (PPB): 23

## 2017-04-21 MED ORDER — METHYLPREDNISOLONE 4 MG PO TBPK: ORAL_TABLET | ORAL | 0 refills | Status: AC

## 2017-04-21 MED ORDER — CEFDINIR 300 MG PO CAPS: 300.0000 mg | ORAL_CAPSULE | Freq: Two times a day (BID) | ORAL | 0 refills | Status: AC

## 2017-04-21 NOTE — Patient Instructions (Signed)
Cough, Adult  Coughing is a reflex that clears your throat and your airways. Coughing helps to heal and protect your lungs. It is normal to cough occasionally, but a cough that happens with other symptoms or lasts a long time may be a sign of a condition that needs treatment. A cough may last only 2-3 weeks (acute), or it may last longer than 8 weeks (chronic).  What are the causes?  Coughing is commonly caused by:   Breathing in substances that irritate your lungs.   A viral or bacterial respiratory infection.   Allergies.   Asthma.   Postnasal drip.   Smoking.   Acid backing up from the stomach into the esophagus (gastroesophageal reflux).   Certain medicines.   Chronic lung problems, including COPD (or rarely, lung cancer).   Other medical conditions such as heart failure.    Follow these instructions at home:  Pay attention to any changes in your symptoms. Take these actions to help with your discomfort:   Take medicines only as told by your health care provider.  ? If you were prescribed an antibiotic medicine, take it as told by your health care provider. Do not stop taking the antibiotic even if you start to feel better.  ? Talk with your health care provider before you take a cough suppressant medicine.   Drink enough fluid to keep your urine clear or pale yellow.   If the air is dry, use a cold steam vaporizer or humidifier in your bedroom or your home to help loosen secretions.   Avoid anything that causes you to cough at work or at home.   If your cough is worse at night, try sleeping in a semi-upright position.   Avoid cigarette smoke. If you smoke, quit smoking. If you need help quitting, ask your health care provider.   Avoid caffeine.   Avoid alcohol.   Rest as needed.    Contact a health care provider if:   You have new symptoms.   You cough up pus.   Your cough does not get better after 2-3 weeks, or your cough gets worse.   You cannot control your cough with suppressant  medicines and you are losing sleep.   You develop pain that is getting worse or pain that is not controlled with pain medicines.   You have a fever.   You have unexplained weight loss.   You have night sweats.  Get help right away if:   You cough up blood.   You have difficulty breathing.   Your heartbeat is very fast.  This information is not intended to replace advice given to you by your health care provider. Make sure you discuss any questions you have with your health care provider.  Document Released: 07/24/2010 Document Revised: 07/03/2015 Document Reviewed: 04/03/2014  Elsevier Interactive Patient Education  2018 Elsevier Inc[employer]

## 2017-04-23 ENCOUNTER — Encounter: Payer: Self-pay | Admitting: Internal Medicine

## 2017-04-23 NOTE — Progress Notes (Signed)
Subjective:  Patient ID: Alyssa [name], female    DOB: 03-14-1924  Age: 84 y.o. MRN: [id number]  CC: Cough   HPI Alyssa [name] presents for a 2-week history of cough that is productive of yellow phlegm with low-grade fever and chills.  She also complains of facial pain, shortness of breath, and wheezing.  Outpatient Medications Prior to Visit  Medication Sig Dispense Refill  . acetaminophen (TYLENOL ARTHRITIS PAIN) 650 MG CR tablet Take 1,300 mg by mouth 2 (two) times daily.      . Eluxadoline (VIBERZI) 75 MG TABS Take 1 tablet by mouth 2 (two) times daily. 180 tablet 1  . furosemide (LASIX) 40 MG tablet Take 1 tablet (40 mg total) by mouth daily. 30 tablet 10  . levothyroxine (SYNTHROID, LEVOTHROID) 88 MCG tablet TAKE 1 TABLET BY MOUTH ONCE DAILY 90 tablet 1  . warfarin (COUMADIN) 3 MG tablet TAKE AS DIRECTED BY COUMADIN CLINIC. 90 tablet 1  . losartan (COZAAR) 100 MG tablet Take 1 tablet (100 mg total) by mouth daily. (Patient not taking: Reported on 04/21/2017) 90 tablet 1  . potassium chloride (K-DUR) 10 MEQ tablet Take 2 tablets (20 mEq total) by mouth daily. 60 tablet 3   No facility-administered medications prior to visit.     ROS Review of Systems  Constitutional: Positive for chills and fever. Negative for appetite change, diaphoresis and fatigue.  HENT: Positive for sinus pressure, sinus pain and sore throat. Negative for nosebleeds, postnasal drip and sneezing.   Eyes: Negative.   Respiratory: Positive for choking, shortness of breath and wheezing. Negative for cough, chest tightness and stridor.   Cardiovascular: Negative for chest pain, palpitations and leg swelling.  Gastrointestinal: Negative for abdominal pain, constipation, diarrhea, nausea and vomiting.  Endocrine: Negative.   Genitourinary: Negative.  Negative for difficulty urinating.  Musculoskeletal: Negative.  Negative for arthralgias.  Skin: Negative.  Negative for color change and pallor.    Allergic/Immunologic: Negative.   Neurological: Negative.  Negative for dizziness, weakness, light-headedness and numbness.  Hematological: Negative for adenopathy. Does not bruise/bleed easily.  Psychiatric/Behavioral: Negative.     Objective:  BP 110/60 (BP Location: Left Arm, Patient Position: Sitting, Cuff Size: Small)   Pulse 89   Temp 97.9 F (36.6 C) (Oral)   Resp 16   Ht 5\' 6"  (1.676 m)   Wt 130 lb (59 kg)   SpO2 98%   BMI 20.98 kg/m   BP Readings from Last 3 Encounters:  04/21/17 110/60  11/25/16 110/64  09/30/16 110/64    Wt Readings from Last 3 Encounters:  04/21/17 130 lb (59 kg)  11/25/16 125 lb (56.7 kg)  09/30/16 119 lb 6.4 oz (54.2 kg)    Physical Exam  Constitutional: She is oriented to person, place, and time.  Non-toxic appearance. She does not have a sickly appearance. She does not appear ill. No distress.  HENT:  Mouth/Throat: Oropharynx is clear and moist. No oropharyngeal exudate.  Eyes: Conjunctivae are normal. Right eye exhibits no discharge. Left eye exhibits no discharge. No scleral icterus.  Neck: Normal range of motion. Neck supple. No JVD present. No thyromegaly present.  Cardiovascular: Normal rate, regular rhythm and normal heart sounds. Exam reveals no gallop.  No murmur heard. Pulmonary/Chest: Effort normal and breath sounds normal. No respiratory distress. She has no wheezes. She has no rales.  Abdominal: Soft. Bowel sounds are normal. She exhibits no distension and no mass. There is no tenderness. There is no guarding.  Musculoskeletal: Normal  range of motion. She exhibits no edema, tenderness or deformity.  Lymphadenopathy:    She has no cervical adenopathy.  Neurological: She is alert and oriented to person, place, and time.  Skin: Skin is warm and dry. No rash noted. She is not diaphoretic. No erythema. No pallor.  Vitals reviewed.   Lab Results  Component Value Date   WBC 5.1 08/13/2016   HGB 12.3 08/13/2016   HCT 37.4  08/13/2016   PLT 204 08/13/2016   GLUCOSE 86 09/01/2016   CHOL 155 02/11/2014   TRIG 84.0 02/11/2014   HDL 46.10 02/11/2014   LDLCALC 92 02/11/2014   ALT 16 02/09/2016   AST 23 02/09/2016   NA 143 09/01/2016   K 4.9 09/01/2016   CL 102 09/01/2016   CREATININE 1.16 (H) 09/01/2016   BUN 29 09/01/2016   CO2 25 09/01/2016   TSH 1.010 08/13/2016   INR 2.6 04/11/2017   HGBA1C 5.9 06/09/2012    Dg Chest 2 View  Result Date: 04/21/2017 CLINICAL DATA:  Cough for 2 weeks.  Wheezing EXAM: CHEST - 2 VIEW COMPARISON:  09/01/2016 FINDINGS: Chronic cardiopericardial enlargement. There is a dual-chamber pacer from the left. Greater interstitial markings than prior. No consolidation, effusion, or pneumothorax. Postoperative breast and right axilla IMPRESSION: 1. Slight interstitial prominence when compared to 2018. Question early edema. 2. Chronic cardiomegaly. Electronically Signed   By: Monte Fantasia M.D.   On: 04/21/2017 14:41    Assessment & Plan:   Alyssa [name] was seen today for cough.  Diagnoses and all orders for this visit:  Cough- Her chest x-ray is negative for mass or infiltrate. -     DG Chest 2 View; Future -     POCT EXHALED NITRIC OXIDE  Mild intermittent asthmatic bronchitis with acute exacerbation- She is having a mild flare of her symptoms.  Will treat with a course of systemic steroids. -     methylPREDNISolone (MEDROL DOSEPAK) 4 MG TBPK tablet; TAKE AS DIRECTED  RTI (respiratory tract infection)- I will treat the infection with Omnicef. -     cefdinir (OMNICEF) 300 MG capsule; Take 1 capsule (300 mg total) by mouth 2 (two) times daily for 10 days.   I am having Alyssa [name] start on cefdinir and methylPREDNISolone. I am also having her maintain her acetaminophen, potassium chloride, furosemide, Eluxadoline, levothyroxine, warfarin, and losartan.  Meds ordered this encounter  Medications  . cefdinir (OMNICEF) 300 MG capsule    Sig: Take 1 capsule (300 mg total) by mouth 2  (two) times daily for 10 days.    Dispense:  20 capsule    Refill:  0  . methylPREDNISolone (MEDROL DOSEPAK) 4 MG TBPK tablet    Sig: TAKE AS DIRECTED    Dispense:  21 tablet    Refill:  0     Follow-up: Return in about 2 weeks (around 05/05/2017).  Alyssa Calico, MD

## 2017-04-28 ENCOUNTER — Other Ambulatory Visit: Payer: Self-pay | Admitting: Internal Medicine

## 2017-04-28 ENCOUNTER — Telehealth: Payer: Self-pay | Admitting: Internal Medicine

## 2017-04-28 DIAGNOSIS — J988 Other specified respiratory disorders: Secondary | ICD-10-CM

## 2017-04-28 MED ORDER — HYDROCODONE-HOMATROPINE 5-1.5 MG/5ML PO SYRP: 5.0000 mL | ORAL_SOLUTION | Freq: Three times a day (TID) | ORAL | 0 refills | Status: DC | PRN

## 2017-04-28 NOTE — Telephone Encounter (Signed)
Pt requesting rx for cough syrup.   LOV: 04/21/2017 and c/c cough and dx with: intermittent asthmatic bronchitis and RTI. FENO was 23

## 2017-04-28 NOTE — Telephone Encounter (Signed)
Copied from [place] [id number]. Topic: General - Other >> Apr 28, 2017  9:29 AM Darl Householder, RMA wrote: Reason for CRM: Patient is requesting a prescription for cough syrup, patient states per Dr. [name] he said she can call and request cough syrup if she needed it, please return pt call, patient would like prescription sent to [facility] on [address]

## 2017-05-19 ENCOUNTER — Ambulatory Visit (INDEPENDENT_AMBULATORY_CARE_PROVIDER_SITE_OTHER): Payer: Medicare Other

## 2017-05-19 DIAGNOSIS — Z5181 Encounter for therapeutic drug level monitoring: Secondary | ICD-10-CM

## 2017-05-19 DIAGNOSIS — I4891 Unspecified atrial fibrillation: Secondary | ICD-10-CM | POA: Diagnosis not present

## 2017-05-19 LAB — POCT INR: INR: 2.4

## 2017-05-19 NOTE — Patient Instructions (Signed)
Description   Continue on same dosage 1 tablet daily except 1/2 tablet on Fridays. Recheck in 6 weeks. Call with any new or different medications #[phone]      

## 2017-05-24 ENCOUNTER — Ambulatory Visit: Payer: Medicare Other | Admitting: Internal Medicine

## 2017-05-24 ENCOUNTER — Encounter: Payer: Self-pay | Admitting: Internal Medicine

## 2017-05-24 VITALS — BP 110/50 | HR 87 | Ht 66.0 in | Wt 130.0 lb

## 2017-05-24 DIAGNOSIS — Z950 Presence of cardiac pacemaker: Secondary | ICD-10-CM

## 2017-05-24 DIAGNOSIS — I4891 Unspecified atrial fibrillation: Secondary | ICD-10-CM | POA: Diagnosis not present

## 2017-05-24 DIAGNOSIS — I442 Atrioventricular block, complete: Secondary | ICD-10-CM | POA: Diagnosis not present

## 2017-05-24 MED ORDER — POTASSIUM CHLORIDE ER 10 MEQ PO TBCR: 20.0000 meq | EXTENDED_RELEASE_TABLET | ORAL | 3 refills | Status: DC

## 2017-05-24 MED ORDER — FUROSEMIDE 40 MG PO TABS: 40.0000 mg | ORAL_TABLET | ORAL | 10 refills | Status: DC

## 2017-05-24 NOTE — Progress Notes (Signed)
Patient Care Team: [name], MD as PCP - General [name], MD (Cardiology)   HPI  Alyssa [name] is a 84 y.o. female Seen iin followup of  dyspnea and orthopnea/nocturnal dyspnea  She has permanent atrial fibrillation on coumadin.       3/17 echocardiography>>Normal LV function; severe biatrial enlargement; mild RVE; severe  TR with moderately elevated pulmonary pressure; small pericardial  effusion.    Date Cr K Hgb  7/18 1.16  4.9 12.3         Of late, no symptoms of congestive heart failure.  No peripheral edema nocturnal dyspnea orthopnea.  She awakens frequently with a dry mouth.  Past Medical History:  Diagnosis Date  . Atrial fibrillation ([place])    pacemaker, chronic anticoag  . Breast cancer ([place])   . Diverticulosis   . Fibroma    inner lips/mouth  . GERD (gastroesophageal reflux disease)   . Hx of breast cancer   . Hyperkalemia   . Hypothyroidism   . IBS (irritable bowel syndrome)   . Internal hemorrhoids   . Left inguinal hernia    direct  . MVP (mitral valve prolapse)   . Osteoporosis   . Renal insufficiency   . Thyroid cancer [facility])     Past Surgical History:  Procedure Laterality Date  . COLONOSCOPY  10/28/2005   internal hemorrhoids, diverticulosis (same as in 2002 and random bxs negative then)  . EP IMPLANTABLE DEVICE N/A 02/26/2015   Procedure: PPM Generator Changeout;  Surgeon: [name], MD;  Location: [facility];  Service: Cardiovascular;  Laterality: N/A;  . MASTECTOMY  1982   Bilateral  . PACEMAKER INSERTION    . THYROIDECTOMY    . TONSILLECTOMY    . UPPER GASTROINTESTINAL ENDOSCOPY  09/06/2000   normal    Current Outpatient Medications  Medication Sig Dispense Refill  . acetaminophen (TYLENOL ARTHRITIS PAIN) 650 MG CR tablet Take 1,300 mg by mouth 2 (two) times daily.      . furosemide (LASIX) 40 MG tablet Take 1 tablet (40 mg total) by mouth daily. 30 tablet 10  . HYDROcodone-homatropine (HYCODAN) 5-1.5 MG/5ML  syrup Take 5 mLs by mouth every 8 (eight) hours as needed for cough. 120 mL 0  . levothyroxine (SYNTHROID, LEVOTHROID) 88 MCG tablet TAKE 1 TABLET BY MOUTH ONCE DAILY 90 tablet 1  . losartan (COZAAR) 100 MG tablet Take 1 tablet (100 mg total) by mouth daily. 90 tablet 1  . warfarin (COUMADIN) 3 MG tablet TAKE AS DIRECTED BY COUMADIN CLINIC. 90 tablet 1  . potassium chloride (K-DUR) 10 MEQ tablet Take 2 tablets (20 mEq total) by mouth daily. 60 tablet 3   No current facility-administered medications for this visit.     No Known Allergies  Review of Systems negative except from HPI and PMH  Physical Exam BP (!) 110/50   Pulse 87   Ht 5\' 6"  (1.676 m)   Wt 130 lb (59 kg)   SpO2 96%   BMI 20.98 kg/m  Well developed and nourished in no acute distress HENT normal Neck supple with JVP-flat Clear Regular rate and rhythm, 2/6 m Abd-soft with active BS No Clubbing cyanosis edema Skin-warm and dry A & Oriented  Grossly normal sensory and motor function    ECG demonstrates ventricular pacing at 70   5 Assessment and  Plan  Complete heart block  Atrial fibrillation   HFpEF    TR and RVE  Syncope  Pacemaker-Medtronic The  patient's device was interrogated.  The information was reviewed. No changes were made in the programming.    She is euvolemic.  By symptoms, she may also be intravascularly depleted.  We will check her metabolic profile today and empirically decrease her furosemide to every other day.  She will also decrease her potassium at the same time and we will clarify ongoing potassium supplementation needs based on blood work.  We will check her CBC.  No interval syncope.  No bleeding on her warfarin.   We spent more than 50% of our >25 min visit in face to face counseling regarding the above

## 2017-05-24 NOTE — Patient Instructions (Addendum)
Medication Instructions:  Your physician has recommended you make the following change in your medication:   Take Lasix and Potassium every Mon, Wed, and Fri  Labwork: Your physician recommends that you have a CBC and BMP drawn today.  Testing/Procedures: None ordered.  Follow-Up: Your physician wants you to follow-up in: One Year with Dr Gari Crown will receive a reminder letter in the mail two months in advance. If you don't receive a letter, please call our office to schedule the follow-up appointment.  Remote monitoring is used to monitor your Pacemaker from home. This monitoring reduces the number of office visits required to check your device to one time per year. It allows [place] to keep an eye on the functioning of your device to ensure it is working properly. You are scheduled for a device check from home on 06/06/2017. You may send your transmission at any time that day. If you have a wireless device, the transmission will be sent automatically. After your physician reviews your transmission, you will receive a postcard with your next transmission date.    Any Other Special Instructions Will Be Listed Below (If Applicable).     If you need a refill on your cardiac medications before your next appointment, please call your pharmacy.

## 2017-05-25 LAB — CUP PACEART INCLINIC DEVICE CHECK
Battery Impedance: 247 Ohm
Brady Statistic RV Percent Paced: 99 %
Implantable Lead Implant Date: 20000921
Implantable Lead Implant Date: 20000921
Implantable Lead Location: 753859
Implantable Lead Location: 753860
Lead Channel Impedance Value: 562 Ohm
Lead Channel Impedance Value: 67 Ohm
Lead Channel Pacing Threshold Amplitude: 0.625 V
Lead Channel Pacing Threshold Amplitude: 0.75 V
Lead Channel Pacing Threshold Pulse Width: 0.4 ms
Lead Channel Pacing Threshold Pulse Width: 0.4 ms
MDC IDC MSMT BATTERY REMAINING LONGEVITY: 105 mo
MDC IDC MSMT BATTERY VOLTAGE: 2.79 V
MDC IDC PG IMPLANT DT: 20170118
MDC IDC SESS DTM: 20190416175416
MDC IDC SET LEADCHNL RV PACING AMPLITUDE: 2.5 V
MDC IDC SET LEADCHNL RV PACING PULSEWIDTH: 0.4 ms
MDC IDC SET LEADCHNL RV SENSING SENSITIVITY: 2 mV

## 2017-05-25 LAB — CBC WITH DIFFERENTIAL/PLATELET
BASOS ABS: 0 10*3/uL (ref 0.0–0.2)
Basos: 1 %
EOS (ABSOLUTE): 0.2 10*3/uL (ref 0.0–0.4)
EOS: 3 %
HEMATOCRIT: 35.5 % (ref 34.0–46.6)
HEMOGLOBIN: 11.4 g/dL (ref 11.1–15.9)
Immature Grans (Abs): 0 10*3/uL (ref 0.0–0.1)
Immature Granulocytes: 0 %
LYMPHS ABS: 2.1 10*3/uL (ref 0.7–3.1)
Lymphs: 37 %
MCH: 29.9 pg (ref 26.6–33.0)
MCHC: 32.1 g/dL (ref 31.5–35.7)
MCV: 93 fL (ref 79–97)
MONOCYTES: 9 %
Monocytes Absolute: 0.5 10*3/uL (ref 0.1–0.9)
NEUTROS ABS: 2.9 10*3/uL (ref 1.4–7.0)
Neutrophils: 50 %
Platelets: 245 10*3/uL (ref 150–379)
RBC: 3.81 x10E6/uL (ref 3.77–5.28)
RDW: 13.7 % (ref 12.3–15.4)
WBC: 5.8 10*3/uL (ref 3.4–10.8)

## 2017-05-25 LAB — BASIC METABOLIC PANEL
BUN / CREAT RATIO: 28 (ref 12–28)
BUN: 41 mg/dL — AB (ref 10–36)
CALCIUM: 8.8 mg/dL (ref 8.7–10.3)
CHLORIDE: 103 mmol/L (ref 96–106)
CO2: 24 mmol/L (ref 20–29)
CREATININE: 1.49 mg/dL — AB (ref 0.57–1.00)
GFR, EST AFRICAN AMERICAN: 35 mL/min/{1.73_m2} — AB (ref >59)
GFR, EST NON AFRICAN AMERICAN: 30 mL/min/{1.73_m2} — AB (ref >59)
Glucose: 85 mg/dL (ref 65–99)
Potassium: 4.8 mmol/L (ref 3.5–5.2)
Sodium: 142 mmol/L (ref 134–144)

## 2017-05-25 NOTE — Addendum Note (Signed)
Addended by: Campbell Riches on: 05/25/2017 10:51 AM   Modules accepted: Orders

## 2017-05-30 ENCOUNTER — Telehealth: Payer: Self-pay

## 2017-05-30 DIAGNOSIS — IMO0002 Reserved for concepts with insufficient information to code with codable children: Secondary | ICD-10-CM

## 2017-05-30 DIAGNOSIS — R7989 Other specified abnormal findings of blood chemistry: Secondary | ICD-10-CM

## 2017-05-30 NOTE — Telephone Encounter (Signed)
-----   Message from [name], MD sent at 05/28/2017 10:06 AM EDT ----- Please Inform Patient that labs are normal x## #modest worsening in renal function   Diuretics had been decreased empiracally so could we recheck in 2 week Thanks

## 2017-05-30 NOTE — Telephone Encounter (Signed)
Pt informed of test results and Dr Olin Pia recommendation. Will return on 5/7 for repeat BMP.

## 2017-06-06 ENCOUNTER — Other Ambulatory Visit: Payer: Self-pay | Admitting: Internal Medicine

## 2017-06-06 ENCOUNTER — Ambulatory Visit (INDEPENDENT_AMBULATORY_CARE_PROVIDER_SITE_OTHER): Payer: Medicare Other | Admitting: *Deleted

## 2017-06-06 DIAGNOSIS — I442 Atrioventricular block, complete: Secondary | ICD-10-CM

## 2017-06-06 DIAGNOSIS — E038 Other specified hypothyroidism: Secondary | ICD-10-CM

## 2017-06-06 NOTE — Progress Notes (Signed)
Remote pacemaker transmission.   

## 2017-06-07 ENCOUNTER — Encounter: Payer: Self-pay | Admitting: Cardiology

## 2017-06-08 LAB — CUP PACEART REMOTE DEVICE CHECK
Battery Remaining Longevity: 105 mo
Battery Voltage: 2.79 V
Implantable Lead Implant Date: 20000921
Implantable Lead Location: 753859
Implantable Pulse Generator Implant Date: 20170118
Lead Channel Impedance Value: 67 Ohm
Lead Channel Pacing Threshold Amplitude: 0.75 V
Lead Channel Pacing Threshold Pulse Width: 0.4 ms
Lead Channel Setting Pacing Amplitude: 2.5 V
Lead Channel Setting Pacing Pulse Width: 0.4 ms
MDC IDC LEAD IMPLANT DT: 20000921
MDC IDC LEAD LOCATION: 753860
MDC IDC MSMT BATTERY IMPEDANCE: 248 Ohm
MDC IDC MSMT LEADCHNL RV IMPEDANCE VALUE: 562 Ohm
MDC IDC SESS DTM: 20190429135540
MDC IDC SET LEADCHNL RV SENSING SENSITIVITY: 2 mV
MDC IDC STAT BRADY RV PERCENT PACED: 99 %

## 2017-06-14 ENCOUNTER — Other Ambulatory Visit: Payer: Medicare Other

## 2017-06-14 ENCOUNTER — Other Ambulatory Visit: Payer: Self-pay | Admitting: Internal Medicine

## 2017-06-14 DIAGNOSIS — R7989 Other specified abnormal findings of blood chemistry: Secondary | ICD-10-CM | POA: Diagnosis not present

## 2017-06-15 LAB — BASIC METABOLIC PANEL WITH GFR
BUN/Creatinine Ratio: 26 (ref 12–28)
BUN: 33 mg/dL (ref 10–36)
CO2: 22 mmol/L (ref 20–29)
Calcium: 8.8 mg/dL (ref 8.7–10.3)
Chloride: 104 mmol/L (ref 96–106)
Creatinine, Ser: 1.28 mg/dL — ABNORMAL HIGH (ref 0.57–1.00)
GFR calc Af Amer: 42 mL/min/1.73 — ABNORMAL LOW
GFR calc non Af Amer: 37 mL/min/1.73 — ABNORMAL LOW
Glucose: 92 mg/dL (ref 65–99)
Potassium: 5.1 mmol/L (ref 3.5–5.2)
Sodium: 143 mmol/L (ref 134–144)

## 2017-06-30 ENCOUNTER — Ambulatory Visit (INDEPENDENT_AMBULATORY_CARE_PROVIDER_SITE_OTHER): Payer: Medicare Other

## 2017-06-30 DIAGNOSIS — I4891 Unspecified atrial fibrillation: Secondary | ICD-10-CM

## 2017-06-30 DIAGNOSIS — Z5181 Encounter for therapeutic drug level monitoring: Secondary | ICD-10-CM | POA: Diagnosis not present

## 2017-06-30 LAB — POCT INR: INR: 2.4 (ref 2.0–3.0)

## 2017-06-30 NOTE — Patient Instructions (Signed)
Description   Continue on same dosage 1 tablet daily except 1/2 tablet on Fridays. Recheck in 6 weeks. Call with any new or different medications #[phone]      

## 2017-07-13 DIAGNOSIS — H5213 Myopia, bilateral: Secondary | ICD-10-CM | POA: Diagnosis not present

## 2017-07-23 ENCOUNTER — Other Ambulatory Visit: Payer: Self-pay | Admitting: Internal Medicine

## 2017-08-10 ENCOUNTER — Encounter: Payer: Self-pay | Admitting: Family

## 2017-08-10 ENCOUNTER — Ambulatory Visit: Payer: Medicare Other | Admitting: Family

## 2017-08-10 VITALS — BP 108/60 | HR 86 | Temp 97.9°F | Ht 66.0 in | Wt 128.0 lb

## 2017-08-10 DIAGNOSIS — H6983 Other specified disorders of Eustachian tube, bilateral: Secondary | ICD-10-CM

## 2017-08-10 DIAGNOSIS — J309 Allergic rhinitis, unspecified: Secondary | ICD-10-CM

## 2017-08-10 MED ORDER — FLUTICASONE PROPIONATE 50 MCG/ACT NA SUSP: 2.0000 | Freq: Every day | NASAL | 6 refills | Status: DC

## 2017-08-10 NOTE — Progress Notes (Signed)
Alyssa [name] is a 84 y.o. female with the following history as recorded in EpicCare:  Patient Active Problem List   Diagnosis Date Noted  . Cough 04/21/2017  . Asthmatic bronchitis 04/21/2017  . RTI (respiratory tract infection) 04/21/2017  . Seasonal allergic rhinitis due to pollen 11/25/2016  . (HFpEF) heart failure with preserved ejection fraction ([place]) 08/13/2016  . Situational anxiety 02/18/2016  . Essential hypertension, benign 09/04/2015  . Severe tricuspid regurgitation 04/22/2015  . Complete heart block ([place]) 02/26/2015  . Routine general medical examination at a health care facility 02/11/2014  . Encounter for therapeutic drug monitoring 07/23/2013  .  atrial fibrillation 10/11/2012  . Chronic renal insufficiency, stage III (moderate) ([place]) 06/12/2012  . Pacemaker-dual chamber  06/23/2010  . Allergic rhinitis 05/20/2010  . Irritable bowel syndrome 12/01/2009  . GERD 06/10/2009  . Hypothyroidism 02/26/2008  . OSTEOPOROSIS 02/26/2008    Current Outpatient Medications  Medication Sig Dispense Refill  . acetaminophen (TYLENOL ARTHRITIS PAIN) 650 MG CR tablet Take 1,300 mg by mouth 2 (two) times daily.      . furosemide (LASIX) 40 MG tablet Take 1 tablet (40 mg total) by mouth every other day. Take every Mon, Wed, Fri 30 tablet 10  . HYDROcodone-homatropine (HYCODAN) 5-1.5 MG/5ML syrup Take 5 mLs by mouth every 8 (eight) hours as needed for cough. 120 mL 0  . levothyroxine (SYNTHROID, LEVOTHROID) 88 MCG tablet TAKE 1 TABLET BY MOUTH ONCE DAILY 90 tablet 1  . losartan (COZAAR) 100 MG tablet Take 1 tablet (100 mg total) by mouth daily. 90 tablet 1  . potassium chloride (K-DUR) 10 MEQ tablet Take 2 tablets (20 mEq total) by mouth every other day. Take Mon, Wed, Fri 60 tablet 3  . warfarin (COUMADIN) 3 MG tablet TAKE AS DIRECTED BY COUMADIN CLINIC 90 tablet 1  . fluticasone (FLONASE) 50 MCG/ACT nasal spray Place 2 sprays into both nostrils daily. 16 g 6   No current  facility-administered medications for this visit.     Allergies: Patient has no known allergies.  Past Medical History:  Diagnosis Date  . Atrial fibrillation ([place])    pacemaker, chronic anticoag  . Breast cancer ([place])   . Diverticulosis   . Fibroma    inner lips/mouth  . GERD (gastroesophageal reflux disease)   . Hx of breast cancer   . Hyperkalemia   . Hypothyroidism   . IBS (irritable bowel syndrome)   . Internal hemorrhoids   . Left inguinal hernia    direct  . MVP (mitral valve prolapse)   . Osteoporosis   . Renal insufficiency   . Thyroid cancer [facility])     Past Surgical History:  Procedure Laterality Date  . COLONOSCOPY  10/28/2005   internal hemorrhoids, diverticulosis (same as in 2002 and random bxs negative then)  . EP IMPLANTABLE DEVICE N/A 02/26/2015   Procedure: PPM Generator Changeout;  Surgeon: [name], MD;  Location: [facility];  Service: Cardiovascular;  Laterality: N/A;  . MASTECTOMY  1982   Bilateral  . PACEMAKER INSERTION    . THYROIDECTOMY    . TONSILLECTOMY    . UPPER GASTROINTESTINAL ENDOSCOPY  09/06/2000   normal    Family History  Problem Relation Age of Onset  . Colon cancer Father 32  . Heart disease Father   . Stroke Mother   . Arthritis Other   . Hypertension Other   . Esophageal cancer Neg Hx   . Pancreatic cancer Neg Hx   .  Kidney disease Neg Hx   . Liver disease Neg Hx   . Diabetes Neg Hx   . Rectal cancer Neg Hx   . Stomach cancer Neg Hx     Social History   Tobacco Use  . Smoking status: Former Smoker    Last attempt to quit: 02/08/1954    Years since quitting: 63.5  . Smokeless tobacco: Never Used  Substance Use Topics  . Alcohol use: No    Alcohol/week: 0.0 oz    Subjective:  Patient started with "humming" sensation in her right ear for the past 3 days; notes that these symptoms have been occurring "on and off" for the past year; + chronic sinus congestion; + ear feels full; has tried OTC Claritin but could  not tolerate; uses OTC Saline rinse with some benefit;     Objective:  Vitals:   08/10/17 1419  BP: 108/60  Pulse: 86  Temp: 97.9 F (36.6 C)  TempSrc: Oral  SpO2: 95%  Weight: 128 lb (58.1 kg)  Height: 5\' 6"  (1.676 m)    General: Well developed, well nourished, in no acute distress  Skin : Warm and dry.  Head: Normocephalic and atraumatic  Eyes: Sclera and conjunctiva clear; pupils round and reactive to light; extraocular movements intact  Ears: External normal; canals clear; tympanic membranes congested bilaterally  Oropharynx: Pink, supple. No suspicious lesions  Neck: Supple without thyromegaly, adenopathy  Lungs: Respirations unlabored; clear to auscultation bilaterally without wheeze, rales, rhonchi  CVS exam: normal rate and regular rhythm.  Neurologic: Alert and oriented; speech intact; face symmetrical; moves all extremities well; CNII-XII intact without focal deficit   Assessment:  1. Allergic rhinitis, unspecified seasonality, unspecified trigger   2. Dysfunction of both eustachian tubes     Plan:  Trial of Flonase NS 2 sprays per nostril qd; okay to continue OTC nasal spray; follow-up worse, no better.   No follow-ups on file.  No orders of the defined types were placed in this encounter.   Requested Prescriptions   Signed Prescriptions Disp Refills  . fluticasone (FLONASE) 50 MCG/ACT nasal spray 16 g 6    Sig: Place 2 sprays into both nostrils daily.

## 2017-08-10 NOTE — Patient Instructions (Signed)
Eustachian Tube Dysfunction The eustachian tube connects the middle ear to the back of the nose. It regulates air pressure in the middle ear by allowing air to move between the ear and nose. It also helps to drain fluid from the middle ear space. When the eustachian tube does not function properly, air pressure, fluid, or both can build up in the middle ear. Eustachian tube dysfunction can affect one or both ears. What are the causes? This condition happens when the eustachian tube becomes blocked or cannot open normally. This may result from:  Ear infections.  Colds and other upper respiratory infections.  Allergies.  Irritation, such as from cigarette smoke or acid from the stomach coming up into the esophagus (gastroesophageal reflux).  Sudden changes in air pressure, such as from descending in an airplane.  Abnormal growths in the nose or throat, such as nasal polyps, tumors, or enlarged tissue at the back of the throat (adenoids).  What increases the risk? This condition may be more likely to develop in people who smoke and people who are overweight. Eustachian tube dysfunction may also be more likely to develop in children, especially children who have:  Certain birth defects of the mouth, such as cleft palate.  Large tonsils and adenoids.  What are the signs or symptoms? Symptoms of this condition may include:  A feeling of fullness in the ear.  Ear pain.  Clicking or popping noises in the ear.  Ringing in the ear.  Hearing loss.  Loss of balance.  Symptoms may get worse when the air pressure around you changes, such as when you travel to an area of high elevation or fly on an airplane. How is this diagnosed? This condition may be diagnosed based on:  Your symptoms.  A physical exam of your ear, nose, and throat.  Tests, such as those that measure: ? The movement of your eardrum (tympanogram). ? Your hearing (audiometry).  How is this treated? Treatment  depends on the cause and severity of your condition. If your symptoms are mild, you may be able to relieve your symptoms by moving air into ("popping") your ears. If you have symptoms of fluid in your ears, treatment may include:  Decongestants.  Antihistamines.  Nasal sprays or ear drops that contain medicines that reduce swelling (steroids).  In some cases, you may need to have a procedure to drain the fluid in your eardrum (myringotomy). In this procedure, a small tube is placed in the eardrum to:  Drain the fluid.  Restore the air in the middle ear space.  Follow these instructions at home:  Take over-the-counter and prescription medicines only as told by your health care provider.  Use techniques to help pop your ears as recommended by your health care provider. These may include: ? Chewing gum. ? Yawning. ? Frequent, forceful swallowing. ? Closing your mouth, holding your nose closed, and gently blowing as if you are trying to blow air out of your nose.  Do not do any of the following until your health care provider approves: ? Travel to high altitudes. ? Fly in airplanes. ? Work in a pressurized cabin or room. ? Scuba dive.  Keep your ears dry. Dry your ears completely after showering or bathing.  Do not smoke.  Keep all follow-up visits as told by your health care provider. This is important. Contact a health care provider if:  Your symptoms do not go away after treatment.  Your symptoms come back after treatment.  You are   unable to pop your ears.  You have: ? A fever. ? Pain in your ear. ? Pain in your head or neck. ? Fluid draining from your ear.  Your hearing suddenly changes.  You become very dizzy.  You lose your balance. This information is not intended to replace advice given to you by your health care provider. Make sure you discuss any questions you have with your health care provider. Document Released: 02/21/2015 Document Revised: 07/03/2015  Document Reviewed: 02/13/2014 Elsevier Interactive Patient Education  2018 Elsevier Inc.  

## 2017-08-15 ENCOUNTER — Ambulatory Visit: Payer: Medicare Other | Admitting: *Deleted

## 2017-08-15 DIAGNOSIS — Z5181 Encounter for therapeutic drug level monitoring: Secondary | ICD-10-CM

## 2017-08-15 DIAGNOSIS — I4891 Unspecified atrial fibrillation: Secondary | ICD-10-CM

## 2017-08-15 LAB — POCT INR: INR: 1.8 — AB (ref 2.0–3.0)

## 2017-08-15 NOTE — Patient Instructions (Signed)
Description   Today take 1.5 tablets then continue on same dosage 1 tablet daily except 1/2 tablet on Fridays. Recheck in 4 weeks. Call with any new or different medications [phone]

## 2017-09-03 ENCOUNTER — Other Ambulatory Visit: Payer: Self-pay | Admitting: Internal Medicine

## 2017-09-05 ENCOUNTER — Ambulatory Visit (INDEPENDENT_AMBULATORY_CARE_PROVIDER_SITE_OTHER): Payer: Medicare Other | Admitting: *Deleted

## 2017-09-05 ENCOUNTER — Telehealth: Payer: Self-pay

## 2017-09-05 DIAGNOSIS — I442 Atrioventricular block, complete: Secondary | ICD-10-CM

## 2017-09-05 NOTE — Progress Notes (Signed)
Remote pacemaker transmission.   

## 2017-09-05 NOTE — Telephone Encounter (Signed)
LMOVM reminding pt to send remote transmission.   

## 2017-09-06 ENCOUNTER — Encounter: Payer: Self-pay | Admitting: Cardiology

## 2017-09-07 ENCOUNTER — Encounter: Payer: Self-pay | Admitting: Family

## 2017-09-07 ENCOUNTER — Ambulatory Visit: Payer: Medicare Other | Admitting: Family

## 2017-09-07 VITALS — BP 108/64 | HR 73 | Temp 97.6°F | Ht 66.0 in | Wt 129.1 lb

## 2017-09-07 DIAGNOSIS — H9319 Tinnitus, unspecified ear: Secondary | ICD-10-CM | POA: Diagnosis not present

## 2017-09-07 MED ORDER — CEFDINIR 300 MG PO CAPS: 300.0000 mg | ORAL_CAPSULE | Freq: Two times a day (BID) | ORAL | 0 refills | Status: DC

## 2017-09-07 NOTE — Progress Notes (Signed)
Alyssa [name] is a 84 y.o. female with the following history as recorded in EpicCare:  Patient Active Problem List   Diagnosis Date Noted  . Cough 04/21/2017  . Asthmatic bronchitis 04/21/2017  . RTI (respiratory tract infection) 04/21/2017  . Seasonal allergic rhinitis due to pollen 11/25/2016  . (HFpEF) heart failure with preserved ejection fraction ([place]) 08/13/2016  . Situational anxiety 02/18/2016  . Essential hypertension, benign 09/04/2015  . Severe tricuspid regurgitation 04/22/2015  . Complete heart block ([place]) 02/26/2015  . Routine general medical examination at a health care facility 02/11/2014  . Encounter for therapeutic drug monitoring 07/23/2013  .  atrial fibrillation 10/11/2012  . Chronic renal insufficiency, stage III (moderate) ([place]) 06/12/2012  . Pacemaker-dual chamber  06/23/2010  . Allergic rhinitis 05/20/2010  . Irritable bowel syndrome 12/01/2009  . GERD 06/10/2009  . Hypothyroidism 02/26/2008  . OSTEOPOROSIS 02/26/2008    Current Outpatient Medications  Medication Sig Dispense Refill  . acetaminophen (TYLENOL ARTHRITIS PAIN) 650 MG CR tablet Take 1,300 mg by mouth 2 (two) times daily.      . fluticasone (FLONASE) 50 MCG/ACT nasal spray Place 2 sprays into both nostrils daily. 16 g 6  . furosemide (LASIX) 40 MG tablet Take 1 tablet (40 mg total) by mouth every other day. Take every Mon, Wed, Fri 30 tablet 10  . HYDROcodone-homatropine (HYCODAN) 5-1.5 MG/5ML syrup Take 5 mLs by mouth every 8 (eight) hours as needed for cough. 120 mL 0  . levothyroxine (SYNTHROID, LEVOTHROID) 88 MCG tablet TAKE 1 TABLET BY MOUTH ONCE DAILY 90 tablet 1  . losartan (COZAAR) 100 MG tablet Take 1 tablet (100 mg total) by mouth daily. 90 tablet 1  . potassium chloride (K-DUR) 10 MEQ tablet TAKE 2 TABLETS BY MOUTH DAILY 180 tablet 2  . warfarin (COUMADIN) 3 MG tablet TAKE AS DIRECTED BY COUMADIN CLINIC 90 tablet 1  . cefdinir (OMNICEF) 300 MG capsule Take 1 capsule (300 mg total) by  mouth 2 (two) times daily. 20 capsule 0   No current facility-administered medications for this visit.     Allergies: Patient has no known allergies.  Past Medical History:  Diagnosis Date  . Atrial fibrillation ([place])    pacemaker, chronic anticoag  . Breast cancer ([place])   . Diverticulosis   . Fibroma    inner lips/mouth  . GERD (gastroesophageal reflux disease)   . Hx of breast cancer   . Hyperkalemia   . Hypothyroidism   . IBS (irritable bowel syndrome)   . Internal hemorrhoids   . Left inguinal hernia    direct  . MVP (mitral valve prolapse)   . Osteoporosis   . Renal insufficiency   . Thyroid cancer [facility])     Past Surgical History:  Procedure Laterality Date  . COLONOSCOPY  10/28/2005   internal hemorrhoids, diverticulosis (same as in 2002 and random bxs negative then)  . EP IMPLANTABLE DEVICE N/A 02/26/2015   Procedure: PPM Generator Changeout;  Surgeon: [name], MD;  Location: [facility];  Service: Cardiovascular;  Laterality: N/A;  . MASTECTOMY  1982   Bilateral  . PACEMAKER INSERTION    . THYROIDECTOMY    . TONSILLECTOMY    . UPPER GASTROINTESTINAL ENDOSCOPY  09/06/2000   normal    Family History  Problem Relation Age of Onset  . Colon cancer Father 68  . Heart disease Father   . Stroke Mother   . Arthritis Other   . Hypertension Other   . Esophageal  cancer Neg Hx   . Pancreatic cancer Neg Hx   . Kidney disease Neg Hx   . Liver disease Neg Hx   . Diabetes Neg Hx   . Rectal cancer Neg Hx   . Stomach cancer Neg Hx     Social History   Tobacco Use  . Smoking status: Former Smoker    Last attempt to quit: 02/08/1954    Years since quitting: 63.6  . Smokeless tobacco: Never Used  Substance Use Topics  . Alcohol use: No    Alcohol/week: 0.0 oz    Subjective:  Patient presents with concerns for sinus infection; was seen earlier this month and treated for allergies; no benefit with Flonase; continuing to have "humming" in her right ear;  this humming is not a new problem for the patient- has been present x 6 months.   Objective:  Vitals:   09/07/17 1353  BP: 108/64  Pulse: 73  Temp: 97.6 F (36.4 C)  TempSrc: Oral  SpO2: 96%  Weight: 129 lb 1.9 oz (58.6 kg)  Height: 5\' 6"  (1.676 m)    General: Well developed, well nourished, in no acute distress  Skin : Warm and dry.  Head: Normocephalic and atraumatic  Eyes: Sclera and conjunctiva clear; pupils round and reactive to light; extraocular movements intact  Ears: External normal; canals clear; tympanic membranes congested bilaterally Oropharynx: Pink, supple. No suspicious lesions  Neck: Supple without thyromegaly, adenopathy  Lungs: Respirations unlabored; clear to auscultation bilaterally without wheeze, rales, rhonchi  CVS exam: normal rate and regular rhythm.  Neurologic: Alert and oriented; speech intact; face symmetrical; moves all extremities well; CNII-XII intact without focal deficit   Assessment:  1. Tinnitus, unspecified laterality     Plan:  Will treat for underlying sinus infection; will use Omnicef 300 mg bid x 10 days- this is the antibiotic that patient requests; will also refer to ENT for further evaluation.   No follow-ups on file.  Orders Placed This Encounter  Procedures  . Ambulatory referral to ENT    Referral Priority:   Routine    Referral Type:   Consultation    Referral Reason:   Specialty Services Required    Requested Specialty:   Otolaryngology    Number of Visits Requested:   1    Requested Prescriptions   Signed Prescriptions Disp Refills  . cefdinir (OMNICEF) 300 MG capsule 20 capsule 0    Sig: Take 1 capsule (300 mg total) by mouth 2 (two) times daily.

## 2017-09-09 LAB — CUP PACEART REMOTE DEVICE CHECK
Battery Impedance: 272 Ohm
Battery Remaining Longevity: 102 mo
Date Time Interrogation Session: 20190729153612
Implantable Lead Implant Date: 20000921
Implantable Lead Location: 753859
Implantable Pulse Generator Implant Date: 20170118
Lead Channel Pacing Threshold Amplitude: 0.875 V
Lead Channel Pacing Threshold Pulse Width: 0.4 ms
Lead Channel Setting Pacing Amplitude: 2.5 V
MDC IDC LEAD IMPLANT DT: 20000921
MDC IDC LEAD LOCATION: 753860
MDC IDC MSMT BATTERY VOLTAGE: 2.79 V
MDC IDC MSMT LEADCHNL RA IMPEDANCE VALUE: 67 Ohm
MDC IDC MSMT LEADCHNL RV IMPEDANCE VALUE: 550 Ohm
MDC IDC SET LEADCHNL RV PACING PULSEWIDTH: 0.4 ms
MDC IDC SET LEADCHNL RV SENSING SENSITIVITY: 2 mV
MDC IDC STAT BRADY RV PERCENT PACED: 99 %

## 2017-09-12 ENCOUNTER — Ambulatory Visit: Payer: Medicare Other | Admitting: *Deleted

## 2017-09-12 DIAGNOSIS — Z5181 Encounter for therapeutic drug level monitoring: Secondary | ICD-10-CM | POA: Diagnosis not present

## 2017-09-12 DIAGNOSIS — I4891 Unspecified atrial fibrillation: Secondary | ICD-10-CM

## 2017-09-12 LAB — POCT INR: INR: 2 (ref 2.0–3.0)

## 2017-09-12 NOTE — Patient Instructions (Signed)
Description   Continue on same dosage 1 tablet daily except 1/2 tablet on Fridays. Recheck in 4 weeks. Call with any new or different medications #[phone]     

## 2017-09-13 ENCOUNTER — Telehealth: Payer: Self-pay

## 2017-09-13 NOTE — Telephone Encounter (Addendum)
Patient is calling and wants to know where the referral went to? I only see where it was approved but not the actual office it went too. Patient is ready to schedule. Call back [phone]

## 2017-09-13 NOTE — Telephone Encounter (Signed)
Copied from [place]. Topic: General - Other >> Sep 13, 2017  8:26 AM Keene Breath wrote: Reason for CRM: Patient called to speak with nurse or doctor regarding the medication she prescribed for her ear.  Patient feels the medication is not working and thinks that she might need to see a specialist.  Please advise.  CB# [phone].  >> Sep 13, 2017  8:32 AM Para Skeans A wrote: Patient was seen by Jodi Mourning on 07/31.

## 2017-09-13 NOTE — Telephone Encounter (Signed)
Yes I would agree- that is why we discussed and I placed an ENT referral for her when she was here.

## 2017-09-13 NOTE — Telephone Encounter (Signed)
Left message for patient and CRM created incase she calls back.

## 2017-09-24 ENCOUNTER — Other Ambulatory Visit: Payer: Self-pay | Admitting: Internal Medicine

## 2017-09-24 DIAGNOSIS — I10 Essential (primary) hypertension: Secondary | ICD-10-CM

## 2017-09-24 DIAGNOSIS — I5030 Unspecified diastolic (congestive) heart failure: Secondary | ICD-10-CM

## 2017-09-28 DIAGNOSIS — H9311 Tinnitus, right ear: Secondary | ICD-10-CM | POA: Diagnosis not present

## 2017-09-28 DIAGNOSIS — H9313 Tinnitus, bilateral: Secondary | ICD-10-CM | POA: Diagnosis not present

## 2017-09-28 DIAGNOSIS — K219 Gastro-esophageal reflux disease without esophagitis: Secondary | ICD-10-CM | POA: Diagnosis not present

## 2017-09-28 DIAGNOSIS — J310 Chronic rhinitis: Secondary | ICD-10-CM | POA: Diagnosis not present

## 2017-09-28 DIAGNOSIS — H903 Sensorineural hearing loss, bilateral: Secondary | ICD-10-CM | POA: Diagnosis not present

## 2017-10-11 ENCOUNTER — Ambulatory Visit: Payer: Medicare Other

## 2017-10-11 DIAGNOSIS — Z5181 Encounter for therapeutic drug level monitoring: Secondary | ICD-10-CM | POA: Diagnosis not present

## 2017-10-11 DIAGNOSIS — I4891 Unspecified atrial fibrillation: Secondary | ICD-10-CM

## 2017-10-11 LAB — POCT INR: INR: 2 (ref 2.0–3.0)

## 2017-10-11 NOTE — Patient Instructions (Signed)
Description   Continue on same dosage 1 tablet daily except 1/2 tablet on Fridays. Recheck in 4 weeks. Call with any new or different medications #[phone]     

## 2017-10-21 ENCOUNTER — Telehealth: Payer: Self-pay | Admitting: Internal Medicine

## 2017-10-21 NOTE — Telephone Encounter (Signed)
I am sorry I don't have much else to tell her either. I can refer her to a neurologist. I will try to ask Dr. [name] to see if he has any thoughts on this too.

## 2017-10-21 NOTE — Telephone Encounter (Signed)
Copied from [place] [id number]. Topic: Quick Communication - See Telephone Encounter >> Oct 21, 2017 10:38 AM Rutherford Nail, NT wrote: CRM for notification. See Telephone encounter for: 10/21/17. Patient requesting a call back from Jodi Mourning. States that she is still hearing the musical tone in her ear when she moves her head. States that the ENT that she was referred to did not know anything about her situation. Please advise. CB#: [phone] (Can leave a voicemail)

## 2017-10-24 NOTE — Telephone Encounter (Signed)
She could try Nasacort AQ; this is OTC;

## 2017-10-24 NOTE — Telephone Encounter (Signed)
Spoke with patient and info given. I offered to make appointment to come in and see Dr. [name] but she wanted to wait and try the Nasacort first to see if it worked or not before coming in. She has been informed that if it didn't help to schedule appointment with Dr. [name] to follow up.

## 2017-10-24 NOTE — Telephone Encounter (Signed)
Spoke with patient this morning. She said ENT specialist she saw was very bad and didn't have much to offer. He told her to try Flonase which helped with her symptoms but it made her lose her breath? She is wanting to know if you knew of anything else similar to Flonase she could try?

## 2017-10-26 ENCOUNTER — Telehealth: Payer: Self-pay | Admitting: Internal Medicine

## 2017-10-26 NOTE — Telephone Encounter (Signed)
Spoke with patient - Same day used for tomorrow on 9/19 for ear infection.

## 2017-10-26 NOTE — Telephone Encounter (Signed)
Copied from [place]. Topic: Appointment Scheduling - Scheduling Inquiry for Clinic >> Oct 26, 2017  9:29 AM Cecelia Byars, NT wrote: Reason for CRM: Patient called and said she thinks she has  middle ear infection and has been to see  ENT ,and has been seen in the office in July 2019 and really needs to see Dr [name] per Alben Spittle  nurse via telephone yesterday ,  there is nothing on his schedule until 11/17/17 , please call her at  [phone] to schedule before then . Thanks

## 2017-10-27 ENCOUNTER — Ambulatory Visit (INDEPENDENT_AMBULATORY_CARE_PROVIDER_SITE_OTHER)
Admission: RE | Admit: 2017-10-27 | Discharge: 2017-10-27 | Disposition: A | Discharge status: Home / Self Care | Payer: Medicare Other | Source: Ambulatory Visit | Attending: Internal Medicine | Admitting: Internal Medicine

## 2017-10-27 ENCOUNTER — Other Ambulatory Visit (INDEPENDENT_AMBULATORY_CARE_PROVIDER_SITE_OTHER): Payer: Medicare Other

## 2017-10-27 ENCOUNTER — Encounter: Payer: Self-pay | Admitting: Internal Medicine

## 2017-10-27 ENCOUNTER — Ambulatory Visit: Payer: Medicare Other | Admitting: Internal Medicine

## 2017-10-27 VITALS — BP 160/80 | HR 83 | Temp 98.3°F | Resp 16 | Ht 66.0 in | Wt 129.5 lb

## 2017-10-27 DIAGNOSIS — I10 Essential (primary) hypertension: Secondary | ICD-10-CM

## 2017-10-27 DIAGNOSIS — E039 Hypothyroidism, unspecified: Secondary | ICD-10-CM

## 2017-10-27 DIAGNOSIS — N183 Chronic kidney disease, stage 3 unspecified: Secondary | ICD-10-CM

## 2017-10-27 DIAGNOSIS — R519 Headache, unspecified: Secondary | ICD-10-CM

## 2017-10-27 DIAGNOSIS — J301 Allergic rhinitis due to pollen: Secondary | ICD-10-CM | POA: Diagnosis not present

## 2017-10-27 DIAGNOSIS — R51 Headache: Secondary | ICD-10-CM

## 2017-10-27 DIAGNOSIS — Z23 Encounter for immunization: Secondary | ICD-10-CM

## 2017-10-27 LAB — URINALYSIS, ROUTINE W REFLEX MICROSCOPIC
BILIRUBIN URINE: NEGATIVE
HGB URINE DIPSTICK: NEGATIVE
KETONES UR: NEGATIVE
NITRITE: NEGATIVE
Specific Gravity, Urine: 1.015 (ref 1.000–1.030)
Total Protein, Urine: NEGATIVE
URINE GLUCOSE: NEGATIVE
UROBILINOGEN UA: 0.2 (ref 0.0–1.0)
pH: 5.5 (ref 5.0–8.0)

## 2017-10-27 LAB — CBC WITH DIFFERENTIAL/PLATELET
BASOS ABS: 0 10*3/uL (ref 0.0–0.1)
Basophils Relative: 0.8 % (ref 0.0–3.0)
EOS PCT: 3.1 % (ref 0.0–5.0)
Eosinophils Absolute: 0.1 10*3/uL (ref 0.0–0.7)
HEMATOCRIT: 37.3 % (ref 36.0–46.0)
Hemoglobin: 12.4 g/dL (ref 12.0–15.0)
LYMPHS PCT: 36.5 % (ref 12.0–46.0)
Lymphs Abs: 1.7 10*3/uL (ref 0.7–4.0)
MCHC: 33.2 g/dL (ref 30.0–36.0)
MCV: 91.7 fl (ref 78.0–100.0)
MONOS PCT: 10.1 % (ref 3.0–12.0)
Monocytes Absolute: 0.5 10*3/uL (ref 0.1–1.0)
Neutro Abs: 2.3 10*3/uL (ref 1.4–7.7)
Neutrophils Relative %: 49.5 % (ref 43.0–77.0)
PLATELETS: 181 10*3/uL (ref 150.0–400.0)
RBC: 4.07 Mil/uL (ref 3.87–5.11)
RDW: 12.8 % (ref 11.5–15.5)
WBC: 4.6 10*3/uL (ref 4.0–10.5)

## 2017-10-27 LAB — COMPREHENSIVE METABOLIC PANEL
ALBUMIN: 4.4 g/dL (ref 3.5–5.2)
ALK PHOS: 60 U/L (ref 39–117)
ALT: 17 U/L (ref 0–35)
AST: 21 U/L (ref 0–37)
BILIRUBIN TOTAL: 0.5 mg/dL (ref 0.2–1.2)
BUN: 26 mg/dL — AB (ref 6–23)
CO2: 27 mEq/L (ref 19–32)
Calcium: 9.1 mg/dL (ref 8.4–10.5)
Chloride: 106 mEq/L (ref 96–112)
Creatinine, Ser: 1.13 mg/dL (ref 0.40–1.20)
GFR: 47.92 mL/min — ABNORMAL LOW (ref >60.00)
GLUCOSE: 66 mg/dL — AB (ref 70–99)
POTASSIUM: 4.6 meq/L (ref 3.5–5.1)
SODIUM: 141 meq/L (ref 135–145)
Total Protein: 6.8 g/dL (ref 6.0–8.3)

## 2017-10-27 LAB — TSH: TSH: 0.95 u[IU]/mL (ref 0.35–4.50)

## 2017-10-27 LAB — SEDIMENTATION RATE: SED RATE: 21 mm/h (ref 0–30)

## 2017-10-27 LAB — VITAMIN D 25 HYDROXY (VIT D DEFICIENCY, FRACTURES): VITD: 33.15 ng/mL (ref 30.00–100.00)

## 2017-10-27 MED ORDER — TRIAMCINOLONE ACETONIDE 55 MCG/ACT NA AERO: 2.0000 | INHALATION_SPRAY | Freq: Every day | NASAL | 1 refills | Status: DC

## 2017-10-27 MED ORDER — OLMESARTAN MEDOXOMIL 20 MG PO TABS: 20.0000 mg | ORAL_TABLET | Freq: Every day | ORAL | 0 refills | Status: DC

## 2017-10-27 NOTE — Patient Instructions (Signed)

## 2017-10-27 NOTE — Progress Notes (Signed)
Subjective:  Patient ID: Alyssa [name], female    DOB: 08/24/1924  Age: 84 y.o. MRN: [id number]  CC: Headache and Hypertension   HPI [name] presents for a several day history of nasal congestion, postnasal drip, and headache.  She was using Flonase nasal spray but claims it caused shortness of breath so she has stopped using it.  She describes a headache as an achy sensation in the frontal area.  Outpatient Medications Prior to Visit  Medication Sig Dispense Refill  . acetaminophen (TYLENOL ARTHRITIS PAIN) 650 MG CR tablet Take 1,300 mg by mouth 2 (two) times daily.      . furosemide (LASIX) 40 MG tablet Take 1 tablet (40 mg total) by mouth every other day. Take every Mon, Wed, Fri 30 tablet 10  . levothyroxine (SYNTHROID, LEVOTHROID) 88 MCG tablet TAKE 1 TABLET BY MOUTH ONCE DAILY 90 tablet 1  . losartan (COZAAR) 100 MG tablet TAKE 1 TABLET BY MOUTH ONCE DAILY 90 tablet 1  . potassium chloride (K-DUR) 10 MEQ tablet TAKE 2 TABLETS BY MOUTH DAILY 180 tablet 2  . warfarin (COUMADIN) 3 MG tablet TAKE AS DIRECTED BY COUMADIN CLINIC 90 tablet 1  . fluticasone (FLONASE) 50 MCG/ACT nasal spray Place 2 sprays into both nostrils daily. 16 g 6  . HYDROcodone-homatropine (HYCODAN) 5-1.5 MG/5ML syrup Take 5 mLs by mouth every 8 (eight) hours as needed for cough. 120 mL 0   No facility-administered medications prior to visit.     ROS Review of Systems  Constitutional: Negative for chills, fatigue and fever.  HENT: Positive for congestion, postnasal drip and rhinorrhea. Negative for facial swelling, sinus pressure, sinus pain, sore throat and trouble swallowing.   Eyes: Negative for visual disturbance.  Respiratory: Negative for cough, chest tightness, shortness of breath and wheezing.   Gastrointestinal: Negative for abdominal pain, constipation, diarrhea, nausea and vomiting.  Genitourinary: Negative.  Negative for difficulty urinating and dysuria.  Musculoskeletal: Negative.  Negative for  arthralgias, myalgias and neck pain.  Skin: Negative.   Neurological: Positive for dizziness and headaches. Negative for facial asymmetry, speech difficulty, weakness and numbness.  Hematological: Negative for adenopathy. Does not bruise/bleed easily.  Psychiatric/Behavioral: The patient is nervous/anxious.     Objective:  BP (!) 160/80 (BP Location: Left Arm, Patient Position: Sitting, Cuff Size: Normal)   Pulse 83   Temp 98.3 F (36.8 C) (Oral)   Resp 16   Ht 5\' 6"  (1.676 m)   Wt 129 lb 8 oz (58.7 kg)   SpO2 98%   BMI 20.90 kg/m   BP Readings from Last 3 Encounters:  10/27/17 (!) 160/80  09/07/17 108/64  08/10/17 108/60    Wt Readings from Last 3 Encounters:  10/27/17 129 lb 8 oz (58.7 kg)  09/07/17 129 lb 1.9 oz (58.6 kg)  08/10/17 128 lb (58.1 kg)    Physical Exam  Constitutional: She is oriented to person, place, and time.  Non-toxic appearance. She does not appear ill. No distress.  Eyes: Pupils are equal, round, and reactive to light. EOM are normal. Right eye exhibits normal extraocular motion. Left eye exhibits normal extraocular motion. Right pupil is reactive. Left pupil is reactive.  Neck: Normal range of motion. Neck supple. No JVD present. No neck rigidity. No Brudzinski's sign and no Kernig's sign noted.  Cardiovascular: Normal rate and regular rhythm. Exam reveals no gallop.  Murmur heard. Pulmonary/Chest: Effort normal and breath sounds normal. No respiratory distress. She has no wheezes. She has no  rales.  Abdominal: Soft. Bowel sounds are normal. She exhibits no mass. There is no tenderness. There is no guarding.  Musculoskeletal: Normal range of motion. She exhibits no edema or tenderness.  Neurological: She is alert and oriented to person, place, and time. She displays normal reflexes. No cranial nerve deficit or sensory deficit. She exhibits normal muscle tone. Coordination normal.  Skin: Skin is warm and dry.  Vitals reviewed.   Lab Results    Component Value Date   WBC 4.6 10/27/2017   HGB 12.4 10/27/2017   HCT 37.3 10/27/2017   PLT 181.0 10/27/2017   GLUCOSE 66 (L) 10/27/2017   CHOL 155 02/11/2014   TRIG 84.0 02/11/2014   HDL 46.10 02/11/2014   LDLCALC 92 02/11/2014   ALT 17 10/27/2017   AST 21 10/27/2017   NA 141 10/27/2017   K 4.6 10/27/2017   CL 106 10/27/2017   CREATININE 1.13 10/27/2017   BUN 26 (H) 10/27/2017   CO2 27 10/27/2017   TSH 0.95 10/27/2017   INR 2.0 10/11/2017   HGBA1C 5.9 06/09/2012    Dg Chest 2 View  Result Date: 04/21/2017 CLINICAL DATA:  Cough for 2 weeks.  Wheezing EXAM: CHEST - 2 VIEW COMPARISON:  09/01/2016 FINDINGS: Chronic cardiopericardial enlargement. There is a dual-chamber pacer from the left. Greater interstitial markings than prior. No consolidation, effusion, or pneumothorax. Postoperative breast and right axilla IMPRESSION: 1. Slight interstitial prominence when compared to 2018. Question early edema. 2. Chronic cardiomegaly. Electronically Signed   By: Monte Fantasia M.D.   On: 04/21/2017 14:41    Assessment & Plan:   Anais was seen today for headache and hypertension.  Diagnoses and all orders for this visit:  Need for influenza vaccination -     Flu vaccine HIGH DOSE PF (Fluzone High dose)  Essential hypertension, benign - Her blood pressure is not adequately well controlled.  Her labs are negative for secondary causes or endorgan damage.  I have asked her to add an ARB to the loop diuretic. -     CBC with Differential/Platelet; Future -     VITAMIN D 25 Hydroxy (Vit-D Deficiency, Fractures); Future -     Urinalysis, Routine w reflex microscopic; Future -     Comprehensive metabolic panel; Future  Seasonal allergic rhinitis due to pollen -     triamcinolone (NASACORT) 55 MCG/ACT AERO nasal inhaler; Place 2 sprays into the nose daily.  Acquired hypothyroidism- Her TSH is in the normal range.  She will remain on the current dose of levothyroxine. -     TSH;  Future  Chronic renal insufficiency, stage III (moderate) ([place])- Her renal function is stable but her blood pressure is not adequately well controlled.  I have asked her to add an ARB. -     CBC with Differential/Platelet; Future -     Urinalysis, Routine w reflex microscopic; Future -     Comprehensive metabolic panel; Future  Intractable episodic headache, unspecified headache type- She has a vague description of a headache and other symptoms.  Her lab work is unremarkable and her sed rate is reassuring that she does not have arteritis.  CT scan of the brain is unremarkable.  I am concerned that her blood pressure elevation may be causing the headaches so I will add an ARB to her current regimen.  For now, I have offered her reassurance and asked her to let me know if she develops any new or worsening symptoms. -     CT  Head Wo Contrast; Future -     CBC with Differential/Platelet; Future -     Sedimentation rate; Future -     Comprehensive metabolic panel; Future   I have discontinued [place] Cerise T. Cartmell's HYDROcodone-homatropine and fluticasone. I am also having her start on triamcinolone. Additionally, I am having her maintain her acetaminophen, furosemide, levothyroxine, warfarin, potassium chloride, and losartan.  Meds ordered this encounter  Medications  . triamcinolone (NASACORT) 55 MCG/ACT AERO nasal inhaler    Sig: Place 2 sprays into the nose daily.    Dispense:  32.4 mL    Refill:  1     Follow-up: Return in about 1 week (around 11/03/2017).  [name], MD

## 2017-10-28 MED ORDER — OLMESARTAN MEDOXOMIL-HCTZ 20-12.5 MG PO TABS: 1.0000 | ORAL_TABLET | Freq: Every day | ORAL | 1 refills | Status: DC

## 2017-10-28 NOTE — Addendum Note (Signed)
Addended by: [name] on: 10/28/2017 09:58 AM   Modules accepted: Orders

## 2017-10-29 ENCOUNTER — Other Ambulatory Visit: Payer: Self-pay | Admitting: Internal Medicine

## 2017-10-31 ENCOUNTER — Telehealth: Payer: Self-pay

## 2017-10-31 NOTE — Telephone Encounter (Signed)
-----   Message from [name], MD sent at 10/28/2017 10:02 AM EDT ----- I need to clarify with the patient and her pharmacist what she is taking for high blood pressure.  When I saw her yesterday her systolic blood pressure was above 160.  Her medication list shows that she is taking losartan which is not very effective.  I want her to upgrade to olmesartan.  I also want her to start a thiazide diuretic.  I sent in a prescription yesterday for plain olmesartan.  Please ask the patient and the pharmacist to cancel that and to stop taking the losartan.  I want her to upgrade to olmesartan/HCTZ which I sent to the pharmacy today.  She can continue to take furosemide which has been prescribed by her cardiologist.

## 2017-10-31 NOTE — Telephone Encounter (Signed)
Pt called back and I informed of the rx change and why. Pt stated understanding and will go by the pharmacy tomorrow to pick up her new rx.

## 2017-10-31 NOTE — Telephone Encounter (Signed)
LVM for pt to call back as soon as possible.  RE: Change to bp medications  [facility] pharmacy and they have deleted the rx for losartan and plain olmesartan.  Pharmacy confirmed on 09/24/2017 - 90 day supply of losartan was picked.

## 2017-11-04 ENCOUNTER — Telehealth: Payer: Self-pay | Admitting: Internal Medicine

## 2017-11-04 NOTE — Telephone Encounter (Signed)
Pt calling today with c/o SOB. This has been occurring mainly when she wakes up and while she is laying down for bed. During her last OV, Dr Caryl Comes had reduced her lasix to 40mg  qod. Pt had a similar complaint of nocturnal dyspnea during her office visit in August 2018. At that time, Dr Caryl Comes increased her lasix to 80mg  qd x 3 days.  Pt states she notices her SOB is worse on the days she is not taking her lasix.   I have advised pt to begin taking her lasix, 40mg  qd, for the next 7 days and to call the office to up date [place] on her symptoms. Pt will also take her K supplement every time she takes a lasix as well.   Pt is agreeable to plan and had no additional questions.

## 2017-11-04 NOTE — Telephone Encounter (Signed)
New Message   Pt c/o Shortness Of Breath: STAT if SOB developed within the last 24 hours or pt is noticeably SOB on the phone  1. Are you currently SOB (can you hear that pt is SOB on the phone)?no 2. How long have you been experiencing SOB? Couple of weeks  3. Are you SOB when sitting or when up moving around? No   4. Are you currently experiencing any other symptoms? No    Patient states that the SOB normally occurs when she is sleep and it wakes her up.

## 2017-11-08 ENCOUNTER — Ambulatory Visit (INDEPENDENT_AMBULATORY_CARE_PROVIDER_SITE_OTHER): Payer: Medicare Other

## 2017-11-08 ENCOUNTER — Telehealth: Payer: Self-pay

## 2017-11-08 ENCOUNTER — Telehealth: Payer: Self-pay | Admitting: Internal Medicine

## 2017-11-08 DIAGNOSIS — I4891 Unspecified atrial fibrillation: Secondary | ICD-10-CM | POA: Diagnosis not present

## 2017-11-08 DIAGNOSIS — Z5181 Encounter for therapeutic drug level monitoring: Secondary | ICD-10-CM

## 2017-11-08 LAB — POCT INR: INR: 1.8 — AB (ref 2.0–3.0)

## 2017-11-08 NOTE — Telephone Encounter (Signed)
Pt came into the coumadin clinic today for a routine INR check. She asked if she could speak with me regarding her recent increase in lasix which we discussed last week. Pt has been experiencing nocturnal SOB. This has happened to her in the past and Dr Caryl Comes had increased her lasix at those times. Pt normally takes 40mg  lasix Mon, Wed, Fri but had began taking everyday on 9/27. Pt states initially this helped, but she has had to take an extra dose in the early morning (4am) for the last 2 mornings. She states after she takes her lasix, she has some relief but still has SOB.   I auscultated pt's lung sounds. Bilateral lungs sounds were clear. I have advised pt to call her PCP and let them know what has been going on. Pt has had a recent sinus infection. I advised pt to be further assessed by her PCP, perhaps she may need a CXR to be sure her SOB is from an infection, etc.   Pt has verbalized understanding and will call me tomorrow with an update.

## 2017-11-08 NOTE — Patient Instructions (Signed)
Please take 1.5 tablets tonight, then continue on same dosage 1 tablet daily except 1/2 tablet on Fridays. Recheck in 4 weeks. Call with any new or different medications [phone]

## 2017-11-08 NOTE — Telephone Encounter (Signed)
Copied from [place] [id number]. Topic: Quick Communication - See Telephone Encounter >> Nov 08, 2017  3:19 PM Hewitt Shorts wrote: Pt called stating her cardiologist feels she needs a chest xray due to her having breathing problems can an order be put in so patient can walk in   Best [phone]  please call her when orders are in the system

## 2017-11-09 NOTE — Telephone Encounter (Signed)
Pt calls today to let me know she is feeling much better and had a good nights rest. She has begun to take her lasix as scheduled. Pt still has some complaints of her "head and throat feeling full." I have advised her to call back her PCP. I believe she would benefit from seeing him so she may have a further assessment. Pt agrees and will call her PCP.

## 2017-11-11 ENCOUNTER — Other Ambulatory Visit: Payer: Self-pay | Admitting: Family

## 2017-11-11 ENCOUNTER — Other Ambulatory Visit: Payer: Self-pay | Admitting: *Deleted

## 2017-11-11 ENCOUNTER — Ambulatory Visit (INDEPENDENT_AMBULATORY_CARE_PROVIDER_SITE_OTHER)
Admission: RE | Admit: 2017-11-11 | Discharge: 2017-11-11 | Disposition: A | Discharge status: Home / Self Care | Payer: Medicare Other | Source: Ambulatory Visit | Attending: Family | Admitting: Family

## 2017-11-11 ENCOUNTER — Telehealth: Payer: Self-pay | Admitting: Internal Medicine

## 2017-11-11 DIAGNOSIS — R0602 Shortness of breath: Secondary | ICD-10-CM | POA: Diagnosis not present

## 2017-11-11 DIAGNOSIS — R0600 Dyspnea, unspecified: Secondary | ICD-10-CM | POA: Diagnosis not present

## 2017-11-11 NOTE — Telephone Encounter (Signed)
Pt called continues to complain with feeling the need to  catch breath, yawning and  fatigue b/p today was 135/71 and heart rate 89 the patient was told to contact Dr [name] office to get a CXR Pt contacted PMD on 11/08/17 and phone note was generated for a order to be put in for CXR and to contact pt so pt may walk in and have done.Called PMD and spoke with Aldona Bar and discussed, order entered and this nurse called pt to inform go to office and have CXR done.Pt aware and will go to [place] also  instructed if S/S worsen to go to ED or may call office and speak to the on call folks this weekend .Adonis Housekeeper

## 2017-11-11 NOTE — Telephone Encounter (Signed)
° ° °  Pt c/o Shortness Of Breath: STAT if SOB developed within the last 24 hours or pt is noticeably SOB on the phone  1. Are you currently SOB (can you hear that pt is SOB on the phone)? Comes and goes per patient  2. How long have you been experiencing SOB? At least 1 week  3. Are you SOB when sitting or when up moving around? Sitting and moving  4. Are you currently experiencing any other symptoms? Patient states the furosemide (LASIX) 40 MG tablet is not working. Patient states she needs a chest xray.  No swelling No chest pain

## 2017-11-14 ENCOUNTER — Ambulatory Visit: Payer: Self-pay

## 2017-11-14 NOTE — Telephone Encounter (Signed)
Pt. Reports she wants to report her BP readings from the past two days. Sunday 142/80 and this morning 149/82. No more shortness of breath since Friday. "I feel fine today.I just want Dr [name] to know my BP readings and if he feels their low enough."

## 2017-11-14 NOTE — Telephone Encounter (Signed)
Follow up    Patient is calling back in reference to the xray that she had done and her BP. She states that she was told to call back today with that information. Please call to discuss.

## 2017-11-14 NOTE — Telephone Encounter (Signed)
Spoke with pt today and reviewed cxr. I advised her that she may go back to her qod lasix since her CXR looked clear with no evidence of pulmonary edema. Pt states she would prefer to stay on her lasix qd at this time. I have also suggested to pt that she follow up with her PCP regarding her ongoing SOB. She states she agrees and has been having allergies lately. Pt will call back with any additional questions.

## 2017-12-04 ENCOUNTER — Other Ambulatory Visit: Payer: Self-pay | Admitting: Internal Medicine

## 2017-12-04 DIAGNOSIS — E038 Other specified hypothyroidism: Secondary | ICD-10-CM

## 2017-12-05 ENCOUNTER — Telehealth: Payer: Self-pay | Admitting: Cardiology

## 2017-12-05 ENCOUNTER — Ambulatory Visit (INDEPENDENT_AMBULATORY_CARE_PROVIDER_SITE_OTHER): Payer: Medicare Other | Admitting: *Deleted

## 2017-12-05 DIAGNOSIS — I442 Atrioventricular block, complete: Secondary | ICD-10-CM

## 2017-12-05 DIAGNOSIS — I4891 Unspecified atrial fibrillation: Secondary | ICD-10-CM

## 2017-12-05 NOTE — Telephone Encounter (Signed)
Spoke with pt and reminded pt of remote transmission that is due today. Pt verbalized understanding.   

## 2017-12-06 NOTE — Progress Notes (Signed)
Remote pacemaker transmission.   

## 2017-12-07 ENCOUNTER — Ambulatory Visit: Payer: Medicare Other | Admitting: *Deleted

## 2017-12-07 DIAGNOSIS — Z5181 Encounter for therapeutic drug level monitoring: Secondary | ICD-10-CM

## 2017-12-07 DIAGNOSIS — I4891 Unspecified atrial fibrillation: Secondary | ICD-10-CM

## 2017-12-07 LAB — POCT INR: INR: 2 (ref 2.0–3.0)

## 2017-12-07 NOTE — Patient Instructions (Signed)
Description   Please take 1.5 tablets today, then continue on same dosage 1 tablet daily except 1/2 tablet on Fridays. Recheck in 4 weeks. Call with any new or different medications [phone]

## 2017-12-30 ENCOUNTER — Other Ambulatory Visit: Payer: Self-pay | Admitting: Cardiology

## 2018-01-03 ENCOUNTER — Ambulatory Visit: Payer: Medicare Other | Admitting: Pharmacist

## 2018-01-03 DIAGNOSIS — Z5181 Encounter for therapeutic drug level monitoring: Secondary | ICD-10-CM | POA: Diagnosis not present

## 2018-01-03 DIAGNOSIS — I4891 Unspecified atrial fibrillation: Secondary | ICD-10-CM

## 2018-01-03 LAB — POCT INR: INR: 2 (ref 2.0–3.0)

## 2018-01-03 NOTE — Patient Instructions (Signed)
Description   Continue on same dosage 1 tablet daily except 1/2 tablet on Fridays. Recheck in 4 weeks. Call with any new or different medications [phone]

## 2018-01-30 LAB — CUP PACEART REMOTE DEVICE CHECK
Battery Impedance: 297 Ohm
Battery Remaining Longevity: 100 mo
Battery Voltage: 2.79 V
Brady Statistic RV Percent Paced: 99 %
Implantable Lead Implant Date: 20000921
Implantable Lead Implant Date: 20000921
Implantable Lead Location: 753859
Implantable Lead Location: 753860
Lead Channel Impedance Value: 67 Ohm
Lead Channel Setting Pacing Amplitude: 2.5 V
Lead Channel Setting Pacing Pulse Width: 0.4 ms
Lead Channel Setting Sensing Sensitivity: 2 mV
MDC IDC MSMT LEADCHNL RV IMPEDANCE VALUE: 596 Ohm
MDC IDC MSMT LEADCHNL RV PACING THRESHOLD AMPLITUDE: 0.75 V
MDC IDC MSMT LEADCHNL RV PACING THRESHOLD PULSEWIDTH: 0.4 ms
MDC IDC PG IMPLANT DT: 20170118
MDC IDC SESS DTM: 20191028152526

## 2018-02-02 ENCOUNTER — Other Ambulatory Visit: Payer: Self-pay | Admitting: Internal Medicine

## 2018-02-06 ENCOUNTER — Ambulatory Visit: Payer: Medicare Other | Admitting: *Deleted

## 2018-02-06 DIAGNOSIS — Z5181 Encounter for therapeutic drug level monitoring: Secondary | ICD-10-CM | POA: Diagnosis not present

## 2018-02-06 DIAGNOSIS — I4891 Unspecified atrial fibrillation: Secondary | ICD-10-CM

## 2018-02-06 LAB — POCT INR: INR: 2.3 (ref 2.0–3.0)

## 2018-02-06 NOTE — Patient Instructions (Signed)
Description   Continue on same dosage 1 tablet daily except 1/2 tablet on Fridays. Recheck in 6 weeks. Call with any new or different medications [phone]

## 2018-02-10 DIAGNOSIS — Z85828 Personal history of other malignant neoplasm of skin: Secondary | ICD-10-CM | POA: Diagnosis not present

## 2018-02-10 DIAGNOSIS — D485 Neoplasm of uncertain behavior of skin: Secondary | ICD-10-CM | POA: Diagnosis not present

## 2018-02-10 DIAGNOSIS — L814 Other melanin hyperpigmentation: Secondary | ICD-10-CM | POA: Diagnosis not present

## 2018-02-12 ENCOUNTER — Emergency Department (HOSPITAL_COMMUNITY): Payer: Medicare Other

## 2018-02-12 ENCOUNTER — Encounter (HOSPITAL_COMMUNITY): Payer: Self-pay | Admitting: *Deleted

## 2018-02-12 ENCOUNTER — Other Ambulatory Visit: Payer: Self-pay

## 2018-02-12 ENCOUNTER — Emergency Department (HOSPITAL_COMMUNITY)
Admission: EM | Admit: 2018-02-12 | Discharge: 2018-02-12 | Disposition: A | Discharge status: Home / Self Care | Payer: Medicare Other | Attending: Emergency Medicine | Admitting: Emergency Medicine

## 2018-02-12 DIAGNOSIS — Z87891 Personal history of nicotine dependence: Secondary | ICD-10-CM | POA: Insufficient documentation

## 2018-02-12 DIAGNOSIS — Z7901 Long term (current) use of anticoagulants: Secondary | ICD-10-CM | POA: Insufficient documentation

## 2018-02-12 DIAGNOSIS — R55 Syncope and collapse: Secondary | ICD-10-CM | POA: Insufficient documentation

## 2018-02-12 DIAGNOSIS — N183 Chronic kidney disease, stage 3 (moderate): Secondary | ICD-10-CM | POA: Insufficient documentation

## 2018-02-12 DIAGNOSIS — I959 Hypotension, unspecified: Secondary | ICD-10-CM | POA: Diagnosis not present

## 2018-02-12 DIAGNOSIS — R220 Localized swelling, mass and lump, head: Secondary | ICD-10-CM | POA: Diagnosis not present

## 2018-02-12 DIAGNOSIS — S3992XA Unspecified injury of lower back, initial encounter: Secondary | ICD-10-CM | POA: Diagnosis not present

## 2018-02-12 DIAGNOSIS — S0101XA Laceration without foreign body of scalp, initial encounter: Secondary | ICD-10-CM | POA: Diagnosis not present

## 2018-02-12 DIAGNOSIS — I129 Hypertensive chronic kidney disease with stage 1 through stage 4 chronic kidney disease, or unspecified chronic kidney disease: Secondary | ICD-10-CM | POA: Insufficient documentation

## 2018-02-12 DIAGNOSIS — E860 Dehydration: Secondary | ICD-10-CM | POA: Diagnosis not present

## 2018-02-12 DIAGNOSIS — S0990XA Unspecified injury of head, initial encounter: Secondary | ICD-10-CM | POA: Diagnosis not present

## 2018-02-12 DIAGNOSIS — R42 Dizziness and giddiness: Secondary | ICD-10-CM | POA: Diagnosis not present

## 2018-02-12 DIAGNOSIS — Z950 Presence of cardiac pacemaker: Secondary | ICD-10-CM | POA: Diagnosis not present

## 2018-02-12 DIAGNOSIS — W19XXXA Unspecified fall, initial encounter: Secondary | ICD-10-CM | POA: Insufficient documentation

## 2018-02-12 DIAGNOSIS — Y999 Unspecified external cause status: Secondary | ICD-10-CM | POA: Diagnosis not present

## 2018-02-12 DIAGNOSIS — E039 Hypothyroidism, unspecified: Secondary | ICD-10-CM | POA: Diagnosis not present

## 2018-02-12 DIAGNOSIS — Z23 Encounter for immunization: Secondary | ICD-10-CM | POA: Diagnosis not present

## 2018-02-12 DIAGNOSIS — Y93G9 Activity, other involving cooking and grilling: Secondary | ICD-10-CM | POA: Insufficient documentation

## 2018-02-12 DIAGNOSIS — Y92009 Unspecified place in unspecified non-institutional (private) residence as the place of occurrence of the external cause: Secondary | ICD-10-CM | POA: Diagnosis not present

## 2018-02-12 DIAGNOSIS — M533 Sacrococcygeal disorders, not elsewhere classified: Secondary | ICD-10-CM | POA: Diagnosis not present

## 2018-02-12 DIAGNOSIS — R52 Pain, unspecified: Secondary | ICD-10-CM | POA: Diagnosis not present

## 2018-02-12 DIAGNOSIS — S299XXA Unspecified injury of thorax, initial encounter: Secondary | ICD-10-CM | POA: Diagnosis not present

## 2018-02-12 HISTORY — DX: Essential (primary) hypertension: I10

## 2018-02-12 LAB — COMPREHENSIVE METABOLIC PANEL
ALT: 14 U/L (ref 0–44)
AST: 32 U/L (ref 15–41)
Albumin: 4 g/dL (ref 3.5–5.0)
Alkaline Phosphatase: 51 U/L (ref 38–126)
Anion gap: 9 (ref 5–15)
BUN: 35 mg/dL — ABNORMAL HIGH (ref 8–23)
CO2: 22 mmol/L (ref 22–32)
Calcium: 8.8 mg/dL — ABNORMAL LOW (ref 8.9–10.3)
Chloride: 100 mmol/L (ref 98–111)
Creatinine, Ser: 1.5 mg/dL — ABNORMAL HIGH (ref 0.44–1.00)
GFR calc non Af Amer: 30 mL/min — ABNORMAL LOW (ref >60)
GFR, EST AFRICAN AMERICAN: 35 mL/min — AB (ref >60)
Glucose, Bld: 136 mg/dL — ABNORMAL HIGH (ref 70–99)
Potassium: 5.4 mmol/L — ABNORMAL HIGH (ref 3.5–5.1)
Sodium: 131 mmol/L — ABNORMAL LOW (ref 135–145)
Total Bilirubin: 1.3 mg/dL — ABNORMAL HIGH (ref 0.3–1.2)
Total Protein: 6.4 g/dL — ABNORMAL LOW (ref 6.5–8.1)

## 2018-02-12 LAB — CBC WITH DIFFERENTIAL/PLATELET
Abs Immature Granulocytes: 0.03 10*3/uL (ref 0.00–0.07)
Basophils Absolute: 0 10*3/uL (ref 0.0–0.1)
Basophils Relative: 0 %
EOS ABS: 0 10*3/uL (ref 0.0–0.5)
Eosinophils Relative: 0 %
HCT: 38.5 % (ref 36.0–46.0)
Hemoglobin: 12.3 g/dL (ref 12.0–15.0)
Immature Granulocytes: 0 %
Lymphocytes Relative: 8 %
Lymphs Abs: 0.6 10*3/uL — ABNORMAL LOW (ref 0.7–4.0)
MCH: 29.9 pg (ref 26.0–34.0)
MCHC: 31.9 g/dL (ref 30.0–36.0)
MCV: 93.7 fL (ref 80.0–100.0)
Monocytes Absolute: 0.8 10*3/uL (ref 0.1–1.0)
Monocytes Relative: 10 %
Neutro Abs: 6.5 10*3/uL (ref 1.7–7.7)
Neutrophils Relative %: 82 %
Platelets: 176 10*3/uL (ref 150–400)
RBC: 4.11 MIL/uL (ref 3.87–5.11)
RDW: 11.5 % (ref 11.5–15.5)
WBC: 8 10*3/uL (ref 4.0–10.5)
nRBC: 0 % (ref 0.0–0.2)

## 2018-02-12 LAB — PROTIME-INR
INR: 2.47
Prothrombin Time: 26.4 seconds — ABNORMAL HIGH (ref 11.4–15.2)

## 2018-02-12 MED ORDER — TETANUS-DIPHTH-ACELL PERTUSSIS 5-2.5-18.5 LF-MCG/0.5 IM SUSP
0.5000 mL | Freq: Once | INTRAMUSCULAR | Status: AC
  Administered 2018-02-12: 0.5 mL via INTRAMUSCULAR
  Filled 2018-02-12: qty 0.5

## 2018-02-12 MED ORDER — SODIUM CHLORIDE 0.9 % IV BOLUS
500.0000 mL | Freq: Once | INTRAVENOUS | Status: AC
  Administered 2018-02-12: 500 mL via INTRAVENOUS

## 2018-02-12 MED ORDER — SODIUM CHLORIDE 0.9 % IV SOLN: INTRAVENOUS | Status: DC

## 2018-02-12 NOTE — Discharge Instructions (Signed)
Make sure you are drinking plenty of fluids and try eating small amounts at a time.  You can continue taking the medication for the diarrhea but talk to Dr. Adah Salvage office in the morning.  If you are standing and start feeling lightheaded lay down immediately and then call for help.  Any further passing out you need to return to the emergency room.  If you develop palpitations or chest pain return immediately.  Your staples will need to be taken out in 7 to 10 days and that can be done at your doctor's office.

## 2018-02-12 NOTE — ED Triage Notes (Signed)
PT reports  She has had a flare-up of IBS  Since Wednesday. Pt reports 3 episodes of loose stools.

## 2018-02-12 NOTE — ED Provider Notes (Signed)
[facility] EMERGENCY DEPARTMENT Provider Note   CSN: [id number] Arrival date & time: 02/12/18  1040     History   Chief Complaint Chief Complaint  Patient presents with  . Back Pain    HPI MARCHELLA HIBBARD is a 85 y.o. female.  Patient is a 85 year old female with a history of atrial fibrillation on Coumadin with a pacemaker, GERD, IBS, hypertension and renal insufficiency who presents today after a syncopal event at home.  Patient states for the last 3 to 4 days she has had a flare of her IBS where she has had at least one episode of voluminous diarrhea daily and abdominal discomfort.  She is denied any vomiting, black appearing stool or fever.  She said that she feels like things got off when she traveled out of town for Christmas.  Today while she was standing in the kitchen getting her breakfast she started feeling lightheaded and woozy.  She then went down on her knees and woke up on the floor.  She did note bleeding from her head and it took her approximately 20 to 25 minutes to get up but then was able to call her neighbors.  Her neighbors are present and state when they got there she was cleaning up her home and was walking and acting normally.  She denies any chest pain, palpitations, shortness of breath but did complain of feeling nauseated after waking up from passing out.  Last episode of syncope was 2 to 3 years ago.  Coumadin was checked on Friday and level was 2.3.   The history is provided by the patient.    Past Medical History:  Diagnosis Date  . Atrial fibrillation ([place])    pacemaker, chronic anticoag  . Breast cancer ([place])   . Diverticulosis   . Fibroma    inner lips/mouth  . GERD (gastroesophageal reflux disease)   . Hx of breast cancer   . Hyperkalemia   . Hypertension   . Hypothyroidism   . IBS (irritable bowel syndrome)   . Internal hemorrhoids   . Left inguinal hernia    direct  . MVP (mitral valve prolapse)   . Osteoporosis   . Renal  insufficiency   . Thyroid cancer [facility])     Patient Active Problem List   Diagnosis Date Noted  . Intractable episodic headache 10/27/2017  . Seasonal allergic rhinitis due to pollen 11/25/2016  . (HFpEF) heart failure with preserved ejection fraction ([place]) 08/13/2016  . Situational anxiety 02/18/2016  . Essential hypertension, benign 09/04/2015  . Severe tricuspid regurgitation 04/22/2015  . Complete heart block ([place]) 02/26/2015  . Routine general medical examination at a health care facility 02/11/2014  . Encounter for therapeutic drug monitoring 07/23/2013  .  atrial fibrillation 10/11/2012  . Chronic renal insufficiency, stage III (moderate) ([place]) 06/12/2012  . Pacemaker-dual chamber  06/23/2010  . Irritable bowel syndrome 12/01/2009  . GERD 06/10/2009  . Hypothyroidism 02/26/2008  . OSTEOPOROSIS 02/26/2008    Past Surgical History:  Procedure Laterality Date  . COLONOSCOPY  10/28/2005   internal hemorrhoids, diverticulosis (same as in 2002 and random bxs negative then)  . EP IMPLANTABLE DEVICE N/A 02/26/2015   Procedure: PPM Generator Changeout;  Surgeon: [name], MD;  Location: [facility];  Service: Cardiovascular;  Laterality: N/A;  . MASTECTOMY  1982   Bilateral  . PACEMAKER INSERTION    . THYROIDECTOMY    . TONSILLECTOMY    . UPPER GASTROINTESTINAL ENDOSCOPY  09/06/2000  normal     OB History   No obstetric history on file.      Home Medications    Prior to Admission medications   Medication Sig Start Date End Date Taking? Authorizing Provider  acetaminophen (TYLENOL ARTHRITIS PAIN) 650 MG CR tablet Take 1,300 mg by mouth 2 (two) times daily.      [provider]  furosemide (LASIX) 40 MG tablet Take 40 MG every Monday. Wednesday, and Friday. 12/30/17   Lyda Jester M, PA-C  levothyroxine (SYNTHROID, LEVOTHROID) 88 MCG tablet TAKE 1 TABLET BY MOUTH ONCE DAILY 12/04/17   [name], MD  olmesartan-hydrochlorothiazide (BENICAR  HCT) 20-12.5 MG tablet Take 1 tablet by mouth daily. 10/28/17   [name], MD  potassium chloride (K-DUR) 10 MEQ tablet TAKE 2 TABLETS BY MOUTH DAILY 10/31/17   [name], MD  triamcinolone (NASACORT) 55 MCG/ACT AERO nasal inhaler Place 2 sprays into the nose daily. 10/27/17   [name], MD  warfarin (COUMADIN) 3 MG tablet TAKE AS DIRECTED BY COUMADIN CLINIC 02/02/18   [name], MD    Family History Family History  Problem Relation Age of Onset  . Colon cancer Father 29  . Heart disease Father   . Stroke Mother   . Arthritis Other   . Hypertension Other   . Esophageal cancer Neg Hx   . Pancreatic cancer Neg Hx   . Kidney disease Neg Hx   . Liver disease Neg Hx   . Diabetes Neg Hx   . Rectal cancer Neg Hx   . Stomach cancer Neg Hx     Social History Social History   Tobacco Use  . Smoking status: Former Smoker    Last attempt to quit: 02/08/1954    Years since quitting: 64.0  . Smokeless tobacco: Never Used  Substance Use Topics  . Alcohol use: No    Alcohol/week: 0.0 standard drinks  . Drug use: No     Allergies   Patient has no known allergies.   Review of Systems Review of Systems  All other systems reviewed and are negative.    Physical Exam Updated Vital Signs BP (!) 133/56 (BP Location: Right Arm)   Pulse 60   Temp 98.1 F (36.7 C) (Oral)   Resp 18   Ht 5' 6.75" (1.695 m)   Wt 54.4 kg   SpO2 99%   BMI 18.94 kg/m   Physical Exam Vitals signs and nursing note reviewed.  Constitutional:      General: She is not in acute distress.    Appearance: She is well-developed.  HENT:     Head: Normocephalic. Laceration present.   Eyes:     Pupils: Pupils are equal, round, and reactive to light.  Neck:     Musculoskeletal: Normal range of motion. No pain with movement, spinous process tenderness or muscular tenderness.  Cardiovascular:     Rate and Rhythm: Normal rate and regular rhythm.     Heart sounds: Normal heart sounds. No  murmur. No friction rub.  Pulmonary:     Effort: Pulmonary effort is normal.     Breath sounds: Normal breath sounds. No wheezing or rales.  Abdominal:     General: Bowel sounds are normal. There is no distension.     Palpations: Abdomen is soft.     Tenderness: There is no abdominal tenderness. There is no guarding or rebound.  Musculoskeletal: Normal range of motion.     Lumbar back: She exhibits  tenderness and bony tenderness. She exhibits normal range of motion.       Back:     Right lower leg: No edema.     Left lower leg: No edema.     Comments: No edema  Skin:    General: Skin is warm and dry.     Findings: No rash.  Neurological:     Mental Status: She is alert and oriented to person, place, and time.     Cranial Nerves: No cranial nerve deficit.  Psychiatric:        Behavior: Behavior normal.      ED Treatments / Results  Labs (all labs ordered are listed, but only abnormal results are displayed) Labs Reviewed  CBC WITH DIFFERENTIAL/PLATELET - Abnormal; Notable for the following components:      Result Value   Lymphs Abs 0.6 (*)    All other components within normal limits  PROTIME-INR - Abnormal; Notable for the following components:   Prothrombin Time 26.4 (*)    All other components within normal limits  COMPREHENSIVE METABOLIC PANEL - Abnormal; Notable for the following components:   Sodium 131 (*)    Potassium 5.4 (*)    Glucose, Bld 136 (*)    BUN 35 (*)    Creatinine, Ser 1.50 (*)    Calcium 8.8 (*)    Total Protein 6.4 (*)    Total Bilirubin 1.3 (*)    GFR calc non Af Amer 30 (*)    GFR calc Af Amer 35 (*)    All other components within normal limits    EKG EKG Interpretation  Date/Time:  Sunday February 12 2018 11:34:01 EST Ventricular Rate:  61 PR Interval:    QRS Duration: 157 QT Interval:  473 QTC Calculation: 477 R Axis:   -83 Text Interpretation:  VENTRICULAR PACED RHYTHM LVH with secondary repolarization abnormality Inferior  infarct, acute Anterior Q waves, possibly due to LVH Confirmed by Blanchie Dessert [phone]) on 02/12/2018 12:46:54 PM   Radiology Dg Chest 2 View  Result Date: 02/12/2018 CLINICAL DATA:  Status post fall this morning. EXAM: CHEST - 2 VIEW COMPARISON:  PA and lateral chest 11/11/2017 and 04/21/2017. FINDINGS: Pacing device in place. There is marked cardiomegaly. Lungs are clear. No pneumothorax or pleural effusion. No acute bony abnormality. Compression fracture at the thoracolumbar junction is chronic. No acute bony abnormality. Surgical clips right axilla noted. IMPRESSION: No acute disease. Cardiomegaly. Electronically Signed   By: Inge Rise M.D.   On: 02/12/2018 12:36   Dg Sacrum/coccyx  Result Date: 02/12/2018 CLINICAL DATA:  Coccygeal pain. Status post fall this morning. Initial encounter. EXAM: SACRUM AND COCCYX - 2+ VIEW COMPARISON:  CT abdomen and pelvis 08/28/2016. FINDINGS: There is no evidence of fracture or other focal bone lesions. Degenerative disease lower lumbar spine noted. IMPRESSION: No acute finding. Electronically Signed   By: Inge Rise M.D.   On: 02/12/2018 12:37   Ct Head Wo Contrast  Result Date: 02/12/2018 CLINICAL DATA:  Dizziness. Fall, striking the back of the head, with laceration. EXAM: CT HEAD WITHOUT CONTRAST TECHNIQUE: Contiguous axial images were obtained from the base of the skull through the vertex without intravenous contrast. COMPARISON:  10/27/2017 FINDINGS: Brain: The brainstem, cerebellum, cerebral peduncles, thalami, basal ganglia, basilar cisterns, and ventricular system appear within normal limits. No intracranial hemorrhage, mass lesion, or acute CVA. Vascular: There is atherosclerotic calcification of the cavernous carotid arteries bilaterally. Skull: Unremarkable Sinuses/Orbits: Mild chronic ethmoid sinusitis. Other: Left posterior occipitoparietal scalp  soft tissue swelling. IMPRESSION: 1. No acute intracranial findings. 2. Left posterior  occipitoparietal scalp soft tissue swelling. 3. Mild chronic ethmoid sinusitis. Electronically Signed   By: Van Clines M.D.   On: 02/12/2018 12:07    Procedures Procedures (including critical care time)  LACERATION REPAIR Performed by: [employer] Authorized by: Blanchie Dessert Consent: Verbal consent obtained. Risks and benefits: risks, benefits and alternatives were discussed Consent given by: patient Patient identity confirmed: provided demographic data Prepped and Draped in normal sterile fashion Wound explored  Laceration Location: scalp  Laceration Length: 3cm  No Foreign Bodies seen or palpated  Anesthesia: none Irrigation method: syringe Amount of cleaning: standard  Skin closure: staples  Number of sutures: 2   Patient tolerance: Patient tolerated the procedure well with no immediate complications.   Medications Ordered in ED Medications  Tdap (BOOSTRIX) injection 0.5 mL (has no administration in time range)  sodium chloride 0.9 % bolus 500 mL (has no administration in time range)  0.9 %  sodium chloride infusion (has no administration in time range)     Initial Impression / Assessment and Plan / ED Course  I have reviewed the triage vital signs and the nursing notes.  Pertinent labs & imaging results that were available during my care of the patient were reviewed by me and considered in my medical decision making (see chart for details).     Patient presenting today after a syncopal event with head injury on Coumadin.  Patient is neurologically intact at this time.  She does have a laceration that will require repair on her head and tetanus shot was updated.  Patient has complained of 3 to 4 days of diarrhea and decreased p.o. intake which she describes as her flare of IBS.  She denies any stool that is black or appears bloody.  Patient is asymptomatic at this time.  Will interrogate pacemaker to ensure no dysrhythmia during the syncopal event.   Patient given IV fluids.  CBC, CMP, INR, head CT and chest x-ray pending.  1:16 PM Labs show a stable INR of 2.47, stable hemoglobin of 12, CMP with slight hemolysis with a potassium of 5.4 and a creatinine that is mildly elevated from the patient's baseline at 1.5.  Interrogating pacemaker shows no evidence of dysrhythmia.  Pacemaker is functioning as expected.  EKG with a paced rhythm and unchanged.  Head CT, chest x-ray and coccyx films without acute findings.  Wound repaired as above.  After fluids patient was ambulated and did well and feel that she is stable for discharge home.  2:43 PM Pt feeling ok and able to ambulate here.  Pt go home with friends and daughter coming to stay with her.  Pt will f/u with Dr. [name] this week  Final Clinical Impressions(s) / ED Diagnoses   Final diagnoses:  Syncope, unspecified syncope type  Laceration of scalp without foreign body, initial encounter  Dehydration    ED Discharge Orders    None       Blanchie Dessert, MD 02/12/18 1447

## 2018-02-12 NOTE — ED Triage Notes (Signed)
PT reports around 9AM today falling backwards and hitting her head while fixing breakfast . Pt reports going down to her knees then fell back ward hitting head. Fall was unwitnessed. Pt unsure how long she passed out.

## 2018-02-20 ENCOUNTER — Other Ambulatory Visit: Payer: Self-pay

## 2018-02-20 ENCOUNTER — Inpatient Hospital Stay (HOSPITAL_COMMUNITY)
Admission: EM | Admit: 2018-02-20 | Discharge: 2018-02-22 | DRG: 641 | Disposition: A | Discharge status: Home Health | Payer: Medicare Other | Attending: Family Medicine | Admitting: Family Medicine

## 2018-02-20 ENCOUNTER — Ambulatory Visit: Payer: Medicare Other | Admitting: Internal Medicine

## 2018-02-20 ENCOUNTER — Other Ambulatory Visit (INDEPENDENT_AMBULATORY_CARE_PROVIDER_SITE_OTHER): Payer: Medicare Other

## 2018-02-20 ENCOUNTER — Inpatient Hospital Stay (HOSPITAL_COMMUNITY): Payer: Medicare Other

## 2018-02-20 ENCOUNTER — Encounter: Payer: Self-pay | Admitting: Internal Medicine

## 2018-02-20 ENCOUNTER — Encounter (HOSPITAL_COMMUNITY): Payer: Self-pay

## 2018-02-20 VITALS — BP 144/86 | HR 68 | Temp 98.0°F | Resp 16 | Ht 66.75 in | Wt 126.0 lb

## 2018-02-20 DIAGNOSIS — Z79890 Hormone replacement therapy (postmenopausal): Secondary | ICD-10-CM

## 2018-02-20 DIAGNOSIS — I442 Atrioventricular block, complete: Secondary | ICD-10-CM

## 2018-02-20 DIAGNOSIS — E440 Moderate protein-calorie malnutrition: Secondary | ICD-10-CM | POA: Diagnosis not present

## 2018-02-20 DIAGNOSIS — R739 Hyperglycemia, unspecified: Secondary | ICD-10-CM | POA: Insufficient documentation

## 2018-02-20 DIAGNOSIS — M810 Age-related osteoporosis without current pathological fracture: Secondary | ICD-10-CM | POA: Diagnosis present

## 2018-02-20 DIAGNOSIS — J301 Allergic rhinitis due to pollen: Secondary | ICD-10-CM | POA: Diagnosis present

## 2018-02-20 DIAGNOSIS — E890 Postprocedural hypothyroidism: Secondary | ICD-10-CM | POA: Diagnosis present

## 2018-02-20 DIAGNOSIS — E861 Hypovolemia: Secondary | ICD-10-CM | POA: Diagnosis present

## 2018-02-20 DIAGNOSIS — I5030 Unspecified diastolic (congestive) heart failure: Secondary | ICD-10-CM | POA: Diagnosis not present

## 2018-02-20 DIAGNOSIS — N183 Chronic kidney disease, stage 3 unspecified: Secondary | ICD-10-CM | POA: Diagnosis present

## 2018-02-20 DIAGNOSIS — K589 Irritable bowel syndrome without diarrhea: Secondary | ICD-10-CM | POA: Diagnosis present

## 2018-02-20 DIAGNOSIS — I10 Essential (primary) hypertension: Secondary | ICD-10-CM | POA: Diagnosis not present

## 2018-02-20 DIAGNOSIS — Z8249 Family history of ischemic heart disease and other diseases of the circulatory system: Secondary | ICD-10-CM

## 2018-02-20 DIAGNOSIS — Z87891 Personal history of nicotine dependence: Secondary | ICD-10-CM

## 2018-02-20 DIAGNOSIS — I341 Nonrheumatic mitral (valve) prolapse: Secondary | ICD-10-CM | POA: Diagnosis not present

## 2018-02-20 DIAGNOSIS — N390 Urinary tract infection, site not specified: Secondary | ICD-10-CM | POA: Diagnosis present

## 2018-02-20 DIAGNOSIS — E039 Hypothyroidism, unspecified: Secondary | ICD-10-CM | POA: Diagnosis not present

## 2018-02-20 DIAGNOSIS — D649 Anemia, unspecified: Secondary | ICD-10-CM | POA: Diagnosis not present

## 2018-02-20 DIAGNOSIS — D638 Anemia in other chronic diseases classified elsewhere: Secondary | ICD-10-CM | POA: Diagnosis present

## 2018-02-20 DIAGNOSIS — E871 Hypo-osmolality and hyponatremia: Secondary | ICD-10-CM | POA: Insufficient documentation

## 2018-02-20 DIAGNOSIS — Z85850 Personal history of malignant neoplasm of thyroid: Secondary | ICD-10-CM

## 2018-02-20 DIAGNOSIS — K219 Gastro-esophageal reflux disease without esophagitis: Secondary | ICD-10-CM | POA: Diagnosis not present

## 2018-02-20 DIAGNOSIS — Z7901 Long term (current) use of anticoagulants: Secondary | ICD-10-CM | POA: Diagnosis not present

## 2018-02-20 DIAGNOSIS — Z79899 Other long term (current) drug therapy: Secondary | ICD-10-CM | POA: Diagnosis not present

## 2018-02-20 DIAGNOSIS — Z853 Personal history of malignant neoplasm of breast: Secondary | ICD-10-CM

## 2018-02-20 DIAGNOSIS — Z800 Family history of malignant neoplasm of digestive organs: Secondary | ICD-10-CM

## 2018-02-20 DIAGNOSIS — Z950 Presence of cardiac pacemaker: Secondary | ICD-10-CM | POA: Diagnosis not present

## 2018-02-20 DIAGNOSIS — F411 Generalized anxiety disorder: Secondary | ICD-10-CM | POA: Diagnosis present

## 2018-02-20 DIAGNOSIS — R51 Headache: Secondary | ICD-10-CM | POA: Diagnosis not present

## 2018-02-20 DIAGNOSIS — K580 Irritable bowel syndrome with diarrhea: Secondary | ICD-10-CM | POA: Diagnosis not present

## 2018-02-20 DIAGNOSIS — J449 Chronic obstructive pulmonary disease, unspecified: Secondary | ICD-10-CM | POA: Diagnosis not present

## 2018-02-20 DIAGNOSIS — I130 Hypertensive heart and chronic kidney disease with heart failure and stage 1 through stage 4 chronic kidney disease, or unspecified chronic kidney disease: Secondary | ICD-10-CM | POA: Diagnosis present

## 2018-02-20 DIAGNOSIS — N1832 Chronic kidney disease, stage 3b: Secondary | ICD-10-CM | POA: Diagnosis present

## 2018-02-20 DIAGNOSIS — I4821 Permanent atrial fibrillation: Secondary | ICD-10-CM | POA: Diagnosis present

## 2018-02-20 DIAGNOSIS — I5032 Chronic diastolic (congestive) heart failure: Secondary | ICD-10-CM

## 2018-02-20 DIAGNOSIS — Z9013 Acquired absence of bilateral breasts and nipples: Secondary | ICD-10-CM

## 2018-02-20 DIAGNOSIS — K5790 Diverticulosis of intestine, part unspecified, without perforation or abscess without bleeding: Secondary | ICD-10-CM | POA: Diagnosis present

## 2018-02-20 DIAGNOSIS — I4891 Unspecified atrial fibrillation: Secondary | ICD-10-CM | POA: Diagnosis not present

## 2018-02-20 DIAGNOSIS — R531 Weakness: Secondary | ICD-10-CM | POA: Diagnosis not present

## 2018-02-20 LAB — TSH: TSH: 0.82 u[IU]/mL (ref 0.35–4.50)

## 2018-02-20 LAB — BASIC METABOLIC PANEL
BUN: 20 mg/dL (ref 6–23)
CO2: 25 mEq/L (ref 19–32)
Calcium: 8.9 mg/dL (ref 8.4–10.5)
Chloride: 86 mEq/L — ABNORMAL LOW (ref 96–112)
Creatinine, Ser: 1.12 mg/dL (ref 0.40–1.20)
GFR: 48.38 mL/min — AB (ref >60.00)
Glucose, Bld: 104 mg/dL — ABNORMAL HIGH (ref 70–99)
Potassium: 4.2 mEq/L (ref 3.5–5.1)
Sodium: 119 mEq/L — CL (ref 135–145)

## 2018-02-20 LAB — CREATININE, URINE, RANDOM: Creatinine, Urine: 40.81 mg/dL

## 2018-02-20 LAB — CBC WITH DIFFERENTIAL/PLATELET
Abs Immature Granulocytes: 0.02 10*3/uL (ref 0.00–0.07)
Basophils Absolute: 0 10*3/uL (ref 0.0–0.1)
Basophils Relative: 0 %
EOS PCT: 1 %
Eosinophils Absolute: 0.1 10*3/uL (ref 0.0–0.5)
HCT: 37.8 % (ref 36.0–46.0)
Hemoglobin: 12.7 g/dL (ref 12.0–15.0)
Immature Granulocytes: 0 %
LYMPHS PCT: 28 %
Lymphs Abs: 1.4 10*3/uL (ref 0.7–4.0)
MCH: 30.7 pg (ref 26.0–34.0)
MCHC: 33.6 g/dL (ref 30.0–36.0)
MCV: 91.3 fL (ref 80.0–100.0)
Monocytes Absolute: 0.4 10*3/uL (ref 0.1–1.0)
Monocytes Relative: 8 %
Neutro Abs: 3.2 10*3/uL (ref 1.7–7.7)
Neutrophils Relative %: 63 %
Platelets: 259 10*3/uL (ref 150–400)
RBC: 4.14 MIL/uL (ref 3.87–5.11)
RDW: 11 % — ABNORMAL LOW (ref 11.5–15.5)
WBC: 5.1 10*3/uL (ref 4.0–10.5)
nRBC: 0 % (ref 0.0–0.2)

## 2018-02-20 LAB — URINALYSIS, ROUTINE W REFLEX MICROSCOPIC
Bilirubin Urine: NEGATIVE
Glucose, UA: NEGATIVE mg/dL
Ketones, ur: NEGATIVE mg/dL
Nitrite: NEGATIVE
PH: 5 (ref 5.0–8.0)
Protein, ur: NEGATIVE mg/dL
Specific Gravity, Urine: 1.006 (ref 1.005–1.030)

## 2018-02-20 LAB — COMPREHENSIVE METABOLIC PANEL
ALT: 21 U/L (ref 0–44)
AST: 29 U/L (ref 15–41)
Albumin: 4.7 g/dL (ref 3.5–5.0)
Alkaline Phosphatase: 63 U/L (ref 38–126)
Anion gap: 12 (ref 5–15)
BUN: 21 mg/dL (ref 8–23)
CHLORIDE: 88 mmol/L — AB (ref 98–111)
CO2: 22 mmol/L (ref 22–32)
Calcium: 8.8 mg/dL — ABNORMAL LOW (ref 8.9–10.3)
Creatinine, Ser: 1.13 mg/dL — ABNORMAL HIGH (ref 0.44–1.00)
GFR calc Af Amer: 49 mL/min — ABNORMAL LOW (ref >60)
GFR calc non Af Amer: 42 mL/min — ABNORMAL LOW (ref >60)
Glucose, Bld: 107 mg/dL — ABNORMAL HIGH (ref 70–99)
Potassium: 4.3 mmol/L (ref 3.5–5.1)
Sodium: 122 mmol/L — ABNORMAL LOW (ref 135–145)
Total Bilirubin: 1 mg/dL (ref 0.3–1.2)
Total Protein: 7.3 g/dL (ref 6.5–8.1)

## 2018-02-20 LAB — PROTIME-INR
INR: 2.88
Prothrombin Time: 29.7 seconds — ABNORMAL HIGH (ref 11.4–15.2)

## 2018-02-20 LAB — OSMOLALITY: Osmolality: 257 mOsm/kg — ABNORMAL LOW (ref 275–295)

## 2018-02-20 LAB — SODIUM, URINE, RANDOM: Sodium, Ur: 37 mmol/L

## 2018-02-20 LAB — OSMOLALITY, URINE: Osmolality, Ur: 229 mOsm/kg — ABNORMAL LOW (ref 300–900)

## 2018-02-20 LAB — HEMOGLOBIN A1C: Hgb A1c MFr Bld: 6 % (ref 4.6–6.5)

## 2018-02-20 MED ORDER — RIFAXIMIN 550 MG PO TABS
550.0000 mg | ORAL_TABLET | Freq: Three times a day (TID) | ORAL | Status: DC
  Administered 2018-02-21 – 2018-02-22 (×4): 550 mg via ORAL
  Filled 2018-02-20 (×7): qty 1

## 2018-02-20 MED ORDER — ONDANSETRON HCL 4 MG/2ML IJ SOLN: 4.0000 mg | Freq: Four times a day (QID) | INTRAMUSCULAR | Status: DC | PRN

## 2018-02-20 MED ORDER — SORBITOL 70 % SOLN
30.0000 mL | Freq: Every day | Status: DC | PRN
  Filled 2018-02-20: qty 30

## 2018-02-20 MED ORDER — ACETAMINOPHEN 650 MG RE SUPP: 650.0000 mg | Freq: Four times a day (QID) | RECTAL | Status: DC | PRN

## 2018-02-20 MED ORDER — SODIUM CHLORIDE 0.9 % IV SOLN
1.0000 g | INTRAVENOUS | Status: DC
  Administered 2018-02-20 – 2018-02-21 (×2): 1 g via INTRAVENOUS
  Filled 2018-02-20 (×2): qty 10
  Filled 2018-02-20: qty 1
  Filled 2018-02-20: qty 10

## 2018-02-20 MED ORDER — ONDANSETRON HCL 4 MG PO TABS: 4.0000 mg | ORAL_TABLET | Freq: Four times a day (QID) | ORAL | Status: DC | PRN

## 2018-02-20 MED ORDER — SENNOSIDES-DOCUSATE SODIUM 8.6-50 MG PO TABS: 1.0000 | ORAL_TABLET | Freq: Every evening | ORAL | Status: DC | PRN

## 2018-02-20 MED ORDER — OLMESARTAN MEDOXOMIL 20 MG PO TABS: 20.0000 mg | ORAL_TABLET | Freq: Every day | ORAL | 0 refills | Status: DC

## 2018-02-20 MED ORDER — ACETAMINOPHEN ER 650 MG PO TBCR: 1300.0000 mg | EXTENDED_RELEASE_TABLET | Freq: Two times a day (BID) | ORAL | Status: DC

## 2018-02-20 MED ORDER — LEVALBUTEROL HCL 0.63 MG/3ML IN NEBU: 0.6300 mg | INHALATION_SOLUTION | RESPIRATORY_TRACT | Status: DC | PRN

## 2018-02-20 MED ORDER — RIFAXIMIN 550 MG PO TABS: 550.0000 mg | ORAL_TABLET | Freq: Three times a day (TID) | ORAL | 0 refills | Status: DC

## 2018-02-20 MED ORDER — SODIUM CHLORIDE 0.9 % IV BOLUS
500.0000 mL | Freq: Once | INTRAVENOUS | Status: AC
  Administered 2018-02-20: 500 mL via INTRAVENOUS

## 2018-02-20 MED ORDER — CLONAZEPAM 0.5 MG PO TABS: 0.5000 mg | ORAL_TABLET | Freq: Two times a day (BID) | ORAL | 0 refills | Status: DC | PRN

## 2018-02-20 MED ORDER — LEVOTHYROXINE SODIUM 88 MCG PO TABS
88.0000 ug | ORAL_TABLET | Freq: Every day | ORAL | Status: DC
  Administered 2018-02-21 – 2018-02-22 (×2): 88 ug via ORAL
  Filled 2018-02-20 (×2): qty 1

## 2018-02-20 MED ORDER — ACETAMINOPHEN 325 MG PO TABS
650.0000 mg | ORAL_TABLET | Freq: Four times a day (QID) | ORAL | Status: DC | PRN
  Administered 2018-02-20 – 2018-02-22 (×5): 650 mg via ORAL
  Filled 2018-02-20 (×5): qty 2

## 2018-02-20 MED ORDER — FLEET ENEMA 7-19 GM/118ML RE ENEM: 1.0000 | ENEMA | Freq: Once | RECTAL | Status: DC | PRN

## 2018-02-20 MED ORDER — SODIUM CHLORIDE 0.9% FLUSH
3.0000 mL | Freq: Two times a day (BID) | INTRAVENOUS | Status: DC
  Administered 2018-02-21 – 2018-02-22 (×3): 3 mL via INTRAVENOUS

## 2018-02-20 MED ORDER — CLONAZEPAM 0.5 MG PO TABS: 0.5000 mg | ORAL_TABLET | Freq: Two times a day (BID) | ORAL | Status: DC | PRN

## 2018-02-20 MED ORDER — SODIUM CHLORIDE 0.9 % IV SOLN
INTRAVENOUS | Status: DC
  Administered 2018-02-20 – 2018-02-22 (×4): via INTRAVENOUS

## 2018-02-20 NOTE — Patient Instructions (Signed)

## 2018-02-20 NOTE — ED Triage Notes (Addendum)
Pt was sent from Dr [name]' office for hyponatremia. Pt has hx of IBS.  Pt states she has had some dizziness and weakness

## 2018-02-20 NOTE — Progress Notes (Signed)
Subjective:  Patient ID: Alyssa [name], female    DOB: 1924/11/28  Age: 85 y.o. MRN: [id number]  CC: Hypertension and Congestive Heart Failure   HPI Alyssa [name] presents for f/up - She returns for follow-up after recently being seen in the ED for a syncopal episode that was associated with diarrhea.  She has a history of IBS D.  She continues to have intermittent diarrhea but now tells me that she has not had a bowel movement for about 24 hours.  She complains of extreme weakness, fatigue, and dizziness.  Outpatient Medications Prior to Visit  Medication Sig Dispense Refill  . acetaminophen (TYLENOL ARTHRITIS PAIN) 650 MG CR tablet Take 1,300 mg by mouth 2 (two) times daily.      . furosemide (LASIX) 40 MG tablet Take 40 MG every Monday. Wednesday, and Friday. (Patient taking differently: Take 40 mg by mouth every Monday, Wednesday, and Friday. ) 36 tablet 1  . levothyroxine (SYNTHROID, LEVOTHROID) 88 MCG tablet TAKE 1 TABLET BY MOUTH ONCE DAILY (Patient taking differently: Take 88 mcg by mouth daily before breakfast. ) 90 tablet 1  . triamcinolone (NASACORT) 55 MCG/ACT AERO nasal inhaler Place 2 sprays into the nose daily. 32.4 mL 1  . warfarin (COUMADIN) 3 MG tablet TAKE AS DIRECTED BY COUMADIN CLINIC (Patient taking differently: Take 1.5-3 mg by mouth See admin instructions. Take 1/2 tablet (1.5mg ) by mouth every Friday, all other days take 1 tablet (3mg ).) 90 tablet 0  . olmesartan-hydrochlorothiazide (BENICAR HCT) 20-12.5 MG tablet Take 1 tablet by mouth daily. 90 tablet 1  . potassium chloride (K-DUR) 10 MEQ tablet TAKE 2 TABLETS BY MOUTH DAILY (Patient taking differently: Take 20 mEq by mouth daily. ) 180 tablet 1   No facility-administered medications prior to visit.     ROS Review of Systems  Constitutional: Positive for fatigue. Negative for activity change, appetite change, diaphoresis and unexpected weight change.  HENT: Negative.  Negative for sore throat and trouble  swallowing.   Eyes: Positive for visual disturbance (blurred vision).  Respiratory: Negative for cough, chest tightness, shortness of breath and wheezing.   Cardiovascular: Negative for chest pain, palpitations and leg swelling.  Gastrointestinal: Positive for diarrhea. Negative for abdominal pain, constipation, nausea and vomiting.  Endocrine: Negative for polydipsia, polyphagia and polyuria.  Genitourinary: Negative.  Negative for difficulty urinating, dysuria, hematuria and urgency.  Musculoskeletal: Negative.   Skin: Negative.  Negative for color change and pallor.  Neurological: Positive for dizziness and weakness. Negative for light-headedness and headaches.  Hematological: Negative for adenopathy. Does not bruise/bleed easily.  Psychiatric/Behavioral: Positive for decreased concentration and sleep disturbance. Negative for self-injury and suicidal ideas. The patient is nervous/anxious.     Objective:  BP (!) 144/86 (BP Location: Left Arm, Patient Position: Sitting, Cuff Size: Normal)   Pulse 68   Temp 98 F (36.7 C) (Oral)   Resp 16   Ht 5' 6.75" (1.695 m)   Wt 126 lb (57.2 kg)   SpO2 96%   BMI 19.88 kg/m   BP Readings from Last 3 Encounters:  02/20/18 (!) 144/86  02/12/18 136/71  10/27/17 (!) 160/80    Wt Readings from Last 3 Encounters:  02/20/18 126 lb (57.2 kg)  02/12/18 120 lb (54.4 kg)  10/27/17 129 lb 8 oz (58.7 kg)    Physical Exam Vitals signs reviewed.  HENT:     Nose: Nose normal.     Mouth/Throat:     Pharynx: Oropharynx is clear. No oropharyngeal exudate.  Eyes:     General: No scleral icterus.    Conjunctiva/sclera: Conjunctivae normal.  Cardiovascular:     Rate and Rhythm: Normal rate and regular rhythm.     Heart sounds: Murmur present. No friction rub. No gallop.   Pulmonary:     Effort: Pulmonary effort is normal.     Breath sounds: No stridor. No wheezing, rhonchi or rales.  Abdominal:     General: Abdomen is flat.     Palpations: There  is no mass.     Tenderness: There is no abdominal tenderness. There is no guarding.  Musculoskeletal: Normal range of motion.        General: No swelling or tenderness.     Right lower leg: No edema.     Left lower leg: No edema.  Skin:    General: Skin is warm and dry.     Findings: No rash.  Neurological:     General: No focal deficit present.     Mental Status: She is alert and oriented to person, place, and time. Mental status is at baseline.     Lab Results  Component Value Date   WBC 8.0 02/12/2018   HGB 12.3 02/12/2018   HCT 38.5 02/12/2018   PLT 176 02/12/2018   GLUCOSE 104 (H) 02/20/2018   CHOL 155 02/11/2014   TRIG 84.0 02/11/2014   HDL 46.10 02/11/2014   LDLCALC 92 02/11/2014   ALT 14 02/12/2018   AST 32 02/12/2018   NA 119 (LL) 02/20/2018   K 4.2 02/20/2018   CL 86 (L) 02/20/2018   CREATININE 1.12 02/20/2018   BUN 20 02/20/2018   CO2 25 02/20/2018   TSH 0.95 10/27/2017   INR 2.47 02/12/2018   HGBA1C 5.9 06/09/2012    Dg Chest 2 View  Result Date: 02/12/2018 CLINICAL DATA:  Status post fall this morning. EXAM: CHEST - 2 VIEW COMPARISON:  PA and lateral chest 11/11/2017 and 04/21/2017. FINDINGS: Pacing device in place. There is marked cardiomegaly. Lungs are clear. No pneumothorax or pleural effusion. No acute bony abnormality. Compression fracture at the thoracolumbar junction is chronic. No acute bony abnormality. Surgical clips right axilla noted. IMPRESSION: No acute disease. Cardiomegaly. Electronically Signed   By: Inge Rise M.D.   On: 02/12/2018 12:36   Dg Sacrum/coccyx  Result Date: 02/12/2018 CLINICAL DATA:  Coccygeal pain. Status post fall this morning. Initial encounter. EXAM: SACRUM AND COCCYX - 2+ VIEW COMPARISON:  CT abdomen and pelvis 08/28/2016. FINDINGS: There is no evidence of fracture or other focal bone lesions. Degenerative disease lower lumbar spine noted. IMPRESSION: No acute finding. Electronically Signed   By: Inge Rise M.D.    On: 02/12/2018 12:37   Ct Head Wo Contrast  Result Date: 02/12/2018 CLINICAL DATA:  Dizziness. Fall, striking the back of the head, with laceration. EXAM: CT HEAD WITHOUT CONTRAST TECHNIQUE: Contiguous axial images were obtained from the base of the skull through the vertex without intravenous contrast. COMPARISON:  10/27/2017 FINDINGS: Brain: The brainstem, cerebellum, cerebral peduncles, thalami, basal ganglia, basilar cisterns, and ventricular system appear within normal limits. No intracranial hemorrhage, mass lesion, or acute CVA. Vascular: There is atherosclerotic calcification of the cavernous carotid arteries bilaterally. Skull: Unremarkable Sinuses/Orbits: Mild chronic ethmoid sinusitis. Other: Left posterior occipitoparietal scalp soft tissue swelling. IMPRESSION: 1. No acute intracranial findings. 2. Left posterior occipitoparietal scalp soft tissue swelling. 3. Mild chronic ethmoid sinusitis. Electronically Signed   By: Van Clines M.D.   On: 02/12/2018 12:07    Assessment &  Plan:   Alyssa [name] was seen today for hypertension and congestive heart failure.  Diagnoses and all orders for this visit:  Acquired hypothyroidism -     TSH; Future  Essential hypertension, benign -     Basic metabolic panel; Future -     olmesartan (BENICAR) 20 MG tablet; Take 1 tablet (20 mg total) by mouth daily.  Hyperglycemia -     Basic metabolic panel; Future -     Hemoglobin A1c; Future  Irritable bowel syndrome with diarrhea- I recommended that she try a course of Xifaxan to treat the IBS-D after her admission. -     rifaximin (XIFAXAN) 550 MG TABS tablet; Take 1 tablet (550 mg total) by mouth 3 (three) times daily for 14 days.  GAD (generalized anxiety disorder) -     clonazePAM (KLONOPIN) 0.5 MG tablet; Take 1 tablet (0.5 mg total) by mouth 2 (two) times daily as needed for anxiety.  Chronic renal insufficiency, stage III (moderate) ([facility])-her renal function has declined.  Chronic heart  failure with preserved ejection fraction ([place])- It does not sound like she can tolerate the combination of a loop and thiazide diuretic.  I recommended that she stop taking the thiazide diuretic and continue the loop diuretic at the current dose with an ARB. -     olmesartan (BENICAR) 20 MG tablet; Take 1 tablet (20 mg total) by mouth daily.  Hyponatremia-she has symptomatic hyponatremia with a sodium down to 119.  She will be transferred to the ED for urgent evaluation and treatment.   I have discontinued Eileen Stanford olmesartan-hydrochlorothiazide and potassium chloride. I am also having her start on rifaximin, clonazePAM, and olmesartan. Additionally, I am having her maintain her acetaminophen, triamcinolone, levothyroxine, furosemide, and warfarin.  Meds ordered this encounter  Medications  . rifaximin (XIFAXAN) 550 MG TABS tablet    Sig: Take 1 tablet (550 mg total) by mouth 3 (three) times daily for 14 days.    Dispense:  42 tablet    Refill:  0  . clonazePAM (KLONOPIN) 0.5 MG tablet    Sig: Take 1 tablet (0.5 mg total) by mouth 2 (two) times daily as needed for anxiety.    Dispense:  180 tablet    Refill:  0  . olmesartan (BENICAR) 20 MG tablet    Sig: Take 1 tablet (20 mg total) by mouth daily.    Dispense:  90 tablet    Refill:  0     Follow-up: No follow-ups on file.  [name], MD

## 2018-02-20 NOTE — ED Provider Notes (Signed)
[facility] DEPT Provider Note   CSN: [id number] Arrival date & time: 02/20/18  1442     History   Chief Complaint Chief Complaint  Patient presents with  . Abnormal Lab    HPI Alyssa [name] is a 85 y.o. female.  HPI  Patient is a 85 year old female with a history of atrial fibrillation on warfarin, pacemaker in place, mitral valve prolapse, IBD, diverticulosis, thyroid cancer status post thyroidectomy presenting for acute hyponatremia with her primary care provider.  Patient reports that she has had a flare of her IBD, being managed by her primary care provider over the past 3 weeks.  She reports that she would have 2-3 episodes of diarrhea per day as well as some abdominal cramping.  She has not had any diarrhea in 2 days.  Patient reports she had a syncopal episode on 02-12-2018 with lab work obtained which showed a sodium of 131.  She saw her primary care provider recheck today and sodium was 119.  She reports that over the past several days she has experienced generalized fatigue as well as "dizziness", and some "haziness" to her vision but denies any syncopal episodes, tremors, seizures, changes in speech, or confusion.  She denies any nausea, vomiting, or abdominal pain.  Patient denies any dysuria, urgency, or frequency, but does report she has a decreased urination over the past couple days.  She is also had poor appetite.  Patient is previously on olmesartan-HCTZ, however her primary care provider discontinued the HCTZ component today.  Past Medical History:  Diagnosis Date  . Atrial fibrillation ([place])    pacemaker, chronic anticoag  . Breast cancer ([place])   . Diverticulosis   . Fibroma    inner lips/mouth  . GERD (gastroesophageal reflux disease)   . Hx of breast cancer   . Hyperkalemia   . Hypertension   . Hypothyroidism   . IBS (irritable bowel syndrome)   . Internal hemorrhoids   . Left inguinal hernia    direct  . MVP (mitral valve  prolapse)   . Osteoporosis   . Renal insufficiency   . Thyroid cancer [facility])     Patient Active Problem List   Diagnosis Date Noted  . Hyperglycemia 02/20/2018  . IBS (irritable bowel syndrome) 02/20/2018  . Hyponatremia 02/20/2018  . Intractable episodic headache 10/27/2017  . Seasonal allergic rhinitis due to pollen 11/25/2016  . (HFpEF) heart failure with preserved ejection fraction ([place]) 08/13/2016  . GAD (generalized anxiety disorder) 02/18/2016  . Essential hypertension, benign 09/04/2015  . Severe tricuspid regurgitation 04/22/2015  . Complete heart block ([place]) 02/26/2015  . Routine general medical examination at a health care facility 02/11/2014  . Encounter for therapeutic drug monitoring 07/23/2013  .  atrial fibrillation 10/11/2012  . Chronic renal insufficiency, stage III (moderate) ([place]) 06/12/2012  . Pacemaker-dual chamber  06/23/2010  . Irritable bowel syndrome 12/01/2009  . GERD 06/10/2009  . Hypothyroidism 02/26/2008  . OSTEOPOROSIS 02/26/2008    Past Surgical History:  Procedure Laterality Date  . COLONOSCOPY  10/28/2005   internal hemorrhoids, diverticulosis (same as in 2002 and random bxs negative then)  . EP IMPLANTABLE DEVICE N/A 02/26/2015   Procedure: PPM Generator Changeout;  Surgeon: [name], MD;  Location: [facility] LAB;  Service: Cardiovascular;  Laterality: N/A;  . MASTECTOMY  1982   Bilateral  . PACEMAKER INSERTION    . THYROIDECTOMY    . TONSILLECTOMY    . UPPER GASTROINTESTINAL ENDOSCOPY  09/06/2000  normal     OB History   No obstetric history on file.      Home Medications    Prior to Admission medications   Medication Sig Start Date End Date Taking? Authorizing Provider  acetaminophen (TYLENOL ARTHRITIS PAIN) 650 MG CR tablet Take 1,300 mg by mouth 2 (two) times daily.      [provider]  clonazePAM (KLONOPIN) 0.5 MG tablet Take 1 tablet (0.5 mg total) by mouth 2 (two) times daily as needed for anxiety.  02/20/18   [name], MD  furosemide (LASIX) 40 MG tablet Take 40 MG every Monday. Wednesday, and Friday. Patient taking differently: Take 40 mg by mouth every Monday, Wednesday, and Friday.  12/30/17   Lyda Jester M, PA-C  levothyroxine (SYNTHROID, LEVOTHROID) 88 MCG tablet TAKE 1 TABLET BY MOUTH ONCE DAILY Patient taking differently: Take 88 mcg by mouth daily before breakfast.  12/04/17   [name], MD  olmesartan (BENICAR) 20 MG tablet Take 1 tablet (20 mg total) by mouth daily. 02/20/18   [name], MD  rifaximin (XIFAXAN) 550 MG TABS tablet Take 1 tablet (550 mg total) by mouth 3 (three) times daily for 14 days. 02/20/18 03/06/18  [name], MD  triamcinolone (NASACORT) 55 MCG/ACT AERO nasal inhaler Place 2 sprays into the nose daily. 10/27/17   [name], MD  warfarin (COUMADIN) 3 MG tablet TAKE AS DIRECTED BY COUMADIN CLINIC Patient taking differently: Take 1.5-3 mg by mouth See admin instructions. Take 1/2 tablet (1.5mg ) by mouth every Friday, all other days take 1 tablet (3mg ). 02/02/18   [name], MD    Family History Family History  Problem Relation Age of Onset  . Colon cancer Father 4  . Heart disease Father   . Stroke Mother   . Arthritis Other   . Hypertension Other   . Esophageal cancer Neg Hx   . Pancreatic cancer Neg Hx   . Kidney disease Neg Hx   . Liver disease Neg Hx   . Diabetes Neg Hx   . Rectal cancer Neg Hx   . Stomach cancer Neg Hx     Social History Social History   Tobacco Use  . Smoking status: Former Smoker    Last attempt to quit: 02/08/1954    Years since quitting: 64.0  . Smokeless tobacco: Never Used  Substance Use Topics  . Alcohol use: No    Alcohol/week: 0.0 standard drinks  . Drug use: No     Allergies   Patient has no known allergies.   Review of Systems Review of Systems  Constitutional: Positive for appetite change. Negative for chills and fever.  HENT: Negative for congestion and sore  throat.   Eyes: Positive for visual disturbance.  Respiratory: Negative for cough, chest tightness and shortness of breath.   Cardiovascular: Negative for chest pain, palpitations and leg swelling.  Gastrointestinal: Negative for abdominal pain, diarrhea, nausea and vomiting.  Genitourinary: Negative for dysuria and flank pain.  Musculoskeletal: Negative for back pain and myalgias.  Skin: Negative for rash.  Neurological: Positive for weakness and light-headedness. Negative for dizziness, syncope and headaches.       +Generalized weakness     Physical Exam Updated Vital Signs BP (!) 167/78 (BP Location: Left Arm)   Pulse 72   Temp 97.6 F (36.4 C) (Oral)   Resp 16   Ht 5\' 6"  (1.676 m)   Wt 57 kg   SpO2 100%   BMI  20.28 kg/m   Physical Exam Vitals signs and nursing note reviewed.  Constitutional:      General: She is not in acute distress.    Appearance: Normal appearance. She is well-developed. She is not ill-appearing.     Comments: Well-appearing, fully alert, and conversant.  HENT:     Head: Normocephalic and atraumatic.  Eyes:     Conjunctiva/sclera: Conjunctivae normal.     Pupils: Pupils are equal, round, and reactive to light.  Neck:     Musculoskeletal: Normal range of motion and neck supple.  Cardiovascular:     Rate and Rhythm: Normal rate and regular rhythm.     Heart sounds: S1 normal and S2 normal. Murmur present.  Pulmonary:     Effort: Pulmonary effort is normal.     Breath sounds: Normal breath sounds. No wheezing or rales.  Abdominal:     General: There is no distension.     Palpations: Abdomen is soft.     Tenderness: There is no abdominal tenderness. There is no guarding.  Musculoskeletal: Normal range of motion.        General: No deformity.  Lymphadenopathy:     Cervical: No cervical adenopathy.  Skin:    General: Skin is warm and dry.     Findings: No erythema or rash.  Neurological:     Mental Status: She is alert.     Comments: Cranial  nerves grossly intact. Strength 5 out of 5 in upper and lower extremities.  Normal tone. Patient moves extremities symmetrically and with good coordination.  Psychiatric:        Behavior: Behavior normal.        Thought Content: Thought content normal.        Judgment: Judgment normal.      ED Treatments / Results  Labs (all labs ordered are listed, but only abnormal results are displayed) Labs Reviewed  COMPREHENSIVE METABOLIC PANEL - Abnormal; Notable for the following components:      Result Value   Sodium 122 (*)    Chloride 88 (*)    Glucose, Bld 107 (*)    Creatinine, Ser 1.13 (*)    Calcium 8.8 (*)    GFR calc non Af Amer 42 (*)    GFR calc Af Amer 49 (*)    All other components within normal limits  CBC WITH DIFFERENTIAL/PLATELET - Abnormal; Notable for the following components:   RDW 11.0 (*)    All other components within normal limits  PROTIME-INR - Abnormal; Notable for the following components:   Prothrombin Time 29.7 (*)    All other components within normal limits  OSMOLALITY  OSMOLALITY, URINE  URINALYSIS, ROUTINE W REFLEX MICROSCOPIC  TROPONIN I    EKG EKG Interpretation  Date/Time:  Monday February 20 2018 16:32:01 EST Ventricular Rate:  71 PR Interval:    QRS Duration: 153 QT Interval:  442 QTC Calculation: 481 R Axis:   -84 Text Interpretation:  Ventricular-paced rhythm No further analysis attempted due to paced rhythm Confirmed by Alyssa [name] [phone]) on 02/20/2018 5:54:07 PM   Radiology No results found.  Procedures Procedures (including critical care time)  CRITICAL CARE Performed by: Albesa Seen   Total critical care time: 35 minutes  Critical care time was exclusive of separately billable procedures and treating other patients.  Critical care was necessary to treat or prevent imminent or life-threatening deterioration.  Critical care was time spent personally by me on the following activities: development of treatment plan  with patient and/or surrogate as well as nursing, discussions with consultants, evaluation of patient's response to treatment, examination of patient, obtaining history from patient or surrogate, ordering and performing treatments and interventions, ordering and review of laboratory studies, ordering and review of radiographic studies, pulse oximetry and re-evaluation of patient's condition.   Medications Ordered in ED Medications - No data to display   Initial Impression / Assessment and Plan / ED Course  I have reviewed the triage vital signs and the nursing notes.  Pertinent labs & imaging results that were available during my care of the patient were reviewed by me and considered in my medical decision making (see chart for details).  Clinical Course as of Feb 20 1814  Mon Feb 20, 2018  1616 Improved from prior lab earlier today.   Sodium(!): 122 [AM]  1616 Overall at baseline.   Creatinine(!): 1.13 [AM]  8502 Spoke with Dr. Grandville Silos of [facility] hospitalist to admit patient.  Appreciate his involvement in the care of this patient.   [AM]    Clinical Course User Index [AM] Albesa Seen, PA-C    Patient is nontoxic-appearing, afebrile, hemodynamically stable in no acute distress.  Patient was referred from primary care provider office for sodium of 119.  This is appears acute, as a days ago patient had a sodium of 131.  This is likely multifactorial in the setting of recent diarrheal episodes, HCTZ use, and hypothyroidism. TSH normal as of 10/2017.  Patient also likely slightly volume down secondary to recent diarrhea.    Here in emergency department, patient's sodium is up to 122.  Patient slightly hypochloremic at 88, likely secondary to diarrhea.  Creatinine appears overall at baseline at 1.13.  Instructed patient not to drink any oral free water here in the emergency department.  Will administer soft fluid hydration with 500 mL of normal saline per discussion with attending  physician, Dr. Trish Mage. Will admit for further workup and monitoring.   Dr. Grandville Silos of [facility] hospital[facility] to admit.  Appreciate his involvement.  Final Clinical Impressions(s) / ED Diagnoses   Final diagnoses:  Hyponatremia    ED Discharge Orders    None       Alyssa [name] 02/20/18 1817    Alyssa Speak, MD 02/20/18 [phone]

## 2018-02-20 NOTE — H&P (Signed)
History and Physical    Alyssa [name]:[id number] DOB: 26-Oct-1924 DOA: 02/20/2018  PCP: [name], MD  Patient coming from: Home  I have personally briefly reviewed patient's old medical records in [place]  Chief Complaint: Abnormal labs.  HPI: Alyssa [name] is a 85 y.o. female with medical history significant of atrial fibrillation on chronic anti-coagulation therapy, history of breast cancer status post bilateral mastectomy mitral valve prolapse, status post PPM, IBD, diverticulosis, history of thyroid cancer status post thyroidectomy presenting to ED from PCPs office with hyponatremia noted to have a sodium of 119.  Patient states she has been having diarrhea off and on for the past 3 weeks with decreased oral intake noted to be on diuretics and Benicar HCT.  Patient does endorse some lightheadedness and dizziness over the past week.  Patient denies any seizures, no tremors, no fever, no nausea, no vomiting, no chest pain, no shortness of breath, no melena, no hematemesis, no hematochezia, no dysuria.  Patient does endorse difficulty urinating.  Patient does endorse some abdominal pain and some chills, extreme weakness, fatigue, dizziness..  Patient states had a syncopal episode about 8 days prior to admission when she hit her head was seen in the ED had staples placed and had followed up with PCP.  Patient's Benicar HCTZ was discontinued on day of admission per PCP.  ED Course: Patient seen in the ED, comprehensive metabolic profile with a sodium of 122, chloride of 88, glucose of 107, creatinine of 1.13 otherwise was within normal limits.  CBC was unremarkable.  Urinalysis large leukocytes, nitrite negative, rare bacteria, haling casts present, 11-20 WBCs.  Chest x-ray pending.  Patient given a bolus of 500 cc normal saline.  Hospitalists were called to admit the patient for further evaluation and management.  Review of Systems: As per HPI otherwise 10 point review of systems  negative.   Past Medical History:  Diagnosis Date  . Atrial fibrillation ([place])    pacemaker, chronic anticoag  . Breast cancer ([place])   . Diverticulosis   . Fibroma    inner lips/mouth  . GERD (gastroesophageal reflux disease)   . Hx of breast cancer   . Hyperkalemia   . Hypertension   . Hypothyroidism   . IBS (irritable bowel syndrome)   . Internal hemorrhoids   . Left inguinal hernia    direct  . MVP (mitral valve prolapse)   . Osteoporosis   . Renal insufficiency   . Thyroid cancer [facility])     Past Surgical History:  Procedure Laterality Date  . COLONOSCOPY  10/28/2005   internal hemorrhoids, diverticulosis (same as in 2002 and random bxs negative then)  . EP IMPLANTABLE DEVICE N/A 02/26/2015   Procedure: PPM Generator Changeout;  Surgeon: [name], MD;  Location: [facility] LAB;  Service: Cardiovascular;  Laterality: N/A;  . MASTECTOMY  1982   Bilateral  . PACEMAKER INSERTION    . THYROIDECTOMY    . TONSILLECTOMY    . UPPER GASTROINTESTINAL ENDOSCOPY  09/06/2000   normal     reports that she quit smoking about 64 years ago. She has never used smokeless tobacco. She reports that she does not drink alcohol or use drugs.  No Known Allergies  Family History  Problem Relation Age of Onset  . Colon cancer Father 48  . Heart disease Father   . Stroke Mother   . Arthritis Other   . Hypertension Other   . Esophageal cancer Neg Hx   .  Pancreatic cancer Neg Hx   . Kidney disease Neg Hx   . Liver disease Neg Hx   . Diabetes Neg Hx   . Rectal cancer Neg Hx   . Stomach cancer Neg Hx    Family history reviewed and not pertinent.  Prior to Admission medications   Medication Sig Start Date End Date Taking? Authorizing Provider  acetaminophen (TYLENOL ARTHRITIS PAIN) 650 MG CR tablet Take 1,300 mg by mouth 2 (two) times daily.     Yes [provider]  furosemide (LASIX) 40 MG tablet Take 40 MG every Monday. Wednesday, and Friday. Patient taking  differently: Take 40 mg by mouth every Monday, Wednesday, and Friday.  12/30/17  Yes Lyda Jester M, PA-C  levothyroxine (SYNTHROID, LEVOTHROID) 88 MCG tablet TAKE 1 TABLET BY MOUTH ONCE DAILY Patient taking differently: Take 88 mcg by mouth daily before breakfast.  12/04/17  Yes [name], MD  olmesartan (BENICAR) 20 MG tablet Take 1 tablet (20 mg total) by mouth daily. 02/20/18  Yes [name], MD  warfarin (COUMADIN) 3 MG tablet TAKE AS DIRECTED BY COUMADIN CLINIC Patient taking differently: Take 1.5-3 mg by mouth See admin instructions. Take 1/2 tablet (1.5mg ) by mouth every Friday, all other days take 1 tablet (3mg ). 02/02/18  Yes [name], MD  clonazePAM (KLONOPIN) 0.5 MG tablet Take 1 tablet (0.5 mg total) by mouth 2 (two) times daily as needed for anxiety. 02/20/18   [name], MD  rifaximin (XIFAXAN) 550 MG TABS tablet Take 1 tablet (550 mg total) by mouth 3 (three) times daily for 14 days. 02/20/18 03/06/18  [name], MD  triamcinolone (NASACORT) 55 MCG/ACT AERO nasal inhaler Place 2 sprays into the nose daily. Patient not taking: Reported on 02/20/2018 10/27/17   [name], MD    Physical Exam: Vitals:   02/20/18 1638 02/20/18 1702 02/20/18 1730 02/20/18 1800  BP:  (!) 152/82 (!) 142/70 (!) 125/94  Pulse: 61 62 61 60  Resp: 17 19 20 14   Temp:      TempSrc:      SpO2: 100% 100% 98% 97%  Weight:      Height:        Constitutional: NAD, calm, comfortable Vitals:   02/20/18 1638 02/20/18 1702 02/20/18 1730 02/20/18 1800  BP:  (!) 152/82 (!) 142/70 (!) 125/94  Pulse: 61 62 61 60  Resp: 17 19 20 14   Temp:      TempSrc:      SpO2: 100% 100% 98% 97%  Weight:      Height:       Eyes: PERRL, lids and conjunctivae normal ENMT: Mucous membranes are dry. Posterior pharynx clear of any exudate or lesions.Normal dentition.  Neck: normal, supple, no masses, no thyromegaly Respiratory: clear to auscultation bilaterally, no wheezing, no crackles.  Normal respiratory effort. No accessory muscle use.  Cardiovascular: Regular rate and rhythm, no murmurs / rubs / gallops. No extremity edema. 2+ pedal pulses. No carotid bruits.  Abdomen: no tenderness, no masses palpated. No hepatosplenomegaly. Bowel sounds positive.  Musculoskeletal: no clubbing / cyanosis. No joint deformity upper and lower extremities. Good ROM, no contractures. Normal muscle tone.  Skin: no rashes, lesions, ulcers. No induration Neurologic: CN 2-12 grossly intact. Sensation intact, DTR normal. Strength 5/5 in all 4.  Psychiatric: Normal judgment and insight. Alert and oriented x 3. Normal mood.   Labs on Admission: I have personally reviewed following labs and imaging studies  CBC: Recent Labs  Lab 02/20/18 1529  WBC 5.1  NEUTROABS 3.2  HGB 12.7  HCT 37.8  MCV 91.3  PLT 939   Basic Metabolic Panel: Recent Labs  Lab 02/20/18 1339 02/20/18 1529  NA 119* 122*  K 4.2 4.3  CL 86* 88*  CO2 25 22  GLUCOSE 104* 107*  BUN 20 21  CREATININE 1.12 1.13*  CALCIUM 8.9 8.8*   GFR: Estimated Creatinine Clearance: 29.2 mL/min (A) (by C-G formula based on SCr of 1.13 mg/dL (H)). Liver Function Tests: Recent Labs  Lab 02/20/18 1529  AST 29  ALT 21  ALKPHOS 63  BILITOT 1.0  PROT 7.3  ALBUMIN 4.7   No results for input(s): LIPASE, AMYLASE in the last 168 hours. No results for input(s): AMMONIA in the last 168 hours. Coagulation Profile: Recent Labs  Lab 02/20/18 1554  INR 2.88   Cardiac Enzymes: No results for input(s): CKTOTAL, CKMB, CKMBINDEX, TROPONINI in the last 168 hours. BNP (last 3 results) No results for input(s): PROBNP in the last 8760 hours. HbA1C: Recent Labs    02/20/18 1339  HGBA1C 6.0   CBG: No results for input(s): GLUCAP in the last 168 hours. Lipid Profile: No results for input(s): CHOL, HDL, LDLCALC, TRIG, CHOLHDL, LDLDIRECT in the last 72 hours. Thyroid Function Tests: Recent Labs    02/20/18 1339  TSH 0.82   Anemia  Panel: No results for input(s): VITAMINB12, FOLATE, FERRITIN, TIBC, IRON, RETICCTPCT in the last 72 hours. Urine analysis:    Component Value Date/Time   COLORURINE STRAW (A) 02/20/2018 1619   APPEARANCEUR CLEAR 02/20/2018 1619   LABSPEC 1.006 02/20/2018 1619   PHURINE 5.0 02/20/2018 1619   GLUCOSEU NEGATIVE 02/20/2018 1619   GLUCOSEU NEGATIVE 10/27/2017 1135   HGBUR SMALL (A) 02/20/2018 1619   BILIRUBINUR NEGATIVE 02/20/2018 1619   [place] 02/20/2018 1619   PROTEINUR NEGATIVE 02/20/2018 1619   UROBILINOGEN 0.2 10/27/2017 1135   NITRITE NEGATIVE 02/20/2018 1619   LEUKOCYTESUR LARGE (A) 02/20/2018 1619    Radiological Exams on Admission: No results found.  EKG: Independently reviewed.  Ventricular paced rhythm  Assessment/Plan Principal Problem:   Hyponatremia Active Problems:   Acute lower UTI   Hypothyroidism   GERD   Irritable bowel syndrome   Pacemaker-dual chamber    Chronic renal insufficiency, stage III (moderate) ([facility])    atrial fibrillation   Complete heart block ([facility])   Essential hypertension, benign   GAD (generalized anxiety disorder)   (HFpEF) heart failure with preserved ejection fraction ([facility])   Seasonal allergic rhinitis due to pollen   IBS (irritable bowel syndrome)    1 severe hyponatremia Patient noted to have a severe hyponatremia at PCPs office with a sodium of 119.  Repeat sodium in the ED with a sodium of 122.  Likely secondary to hypovolemic hyponatremia as patient with a 3-week history of diarrhea off and on secondary to IBD, on Benicar HCTZ as well as Lasix.  Patient also noted with poor oral intake.  Check a urine sodium, urine creatinine, serum osmolality, urine osmolality, TSH, cortisol level.  Patient given a bolus of IV fluids.  Place on normal saline 100 cc/h.  Serial BMETs.  Hold diuretics.  Will likely not resume HCTZ on discharge.  Follow.  2.  Gastroesophageal reflux disease PPI.  3.  Chronic kidney disease stage  III Stable.  Follow.  4.  Complete heart block status post PPM Stable.  5.  Atrial fibrillation on chronic anticoagulation V paced.  Coumadin per pharmacy.  6.  IBS Continue Xifaxan.  7.  Urinary tract infection Check urine cultures.  Place on IV Rocephin.  8.  Status post thyroidectomy Check a free T4.  Check a TSH.  Continue home dose Synthroid.  DVT prophylaxis: On Coumadin Code Status: Full Family Communication: Updated patient.  No family at bedside. Disposition Plan: Likely home once clinically improved and hyponatremia resolved. Consults called: None Admission status: Admit to stepdown unit.   Irine Seal MD [facility]  If 7PM-7AM, please contact night-coverage [email or web address]  02/20/2018, 6:29 PM

## 2018-02-20 NOTE — Progress Notes (Signed)
ANTICOAGULATION CONSULT NOTE - Initial Consult  Pharmacy Consult for coumadin Indication: atrial fibrillation  No Known Allergies  Patient Measurements: Height: 5\' 6"  (167.6 cm) Weight: 125 lb 10.6 oz (57 kg) IBW/kg (Calculated) : 59.3  Vital Signs: Temp: 97.6 F (36.4 C) (01/13 1448) Temp Source: Oral (01/13 1448) BP: 125/94 (01/13 1800) Pulse Rate: 60 (01/13 1800)  Labs: Recent Labs    02/20/18 1339 02/20/18 1529 02/20/18 1554  HGB  --  12.7  --   HCT  --  37.8  --   PLT  --  259  --   LABPROT  --   --  29.7*  INR  --   --  2.88  CREATININE 1.12 1.13*  --     Estimated Creatinine Clearance: 29.2 mL/min (A) (by C-G formula based on SCr of 1.13 mg/dL (H)).   Medical History: Past Medical History:  Diagnosis Date  . Atrial fibrillation ([place])    pacemaker, chronic anticoag  . Breast cancer ([place])   . Diverticulosis   . Fibroma    inner lips/mouth  . GERD (gastroesophageal reflux disease)   . Hx of breast cancer   . Hyperkalemia   . Hypertension   . Hypothyroidism   . IBS (irritable bowel syndrome)   . Internal hemorrhoids   . Left inguinal hernia    direct  . MVP (mitral valve prolapse)   . Osteoporosis   . Renal insufficiency   . Thyroid cancer [facility])    Assessment: [name] on coumadin PTA for Afib.  Home dose per coumadin clinic is 3 mg daily except 1.5 mg on Fridays. Admission INR is therapeutic at 2.88.  She took her last coumadin dose this morning at 07 am.  CBC WNL, no bleeding reported.   Goal of Therapy:  INR 2-3 Monitor platelets by anticoagulation protocol: Yes   Plan:  No coumadin now as pt took dose at home at 07 am Daily INR  Eudelia Bunch, Pharm.D 02/20/2018 6:32 PM

## 2018-02-20 NOTE — ED Notes (Signed)
Patient aware we need a urine sample. Patient will call out when ready to void.

## 2018-02-20 NOTE — ED Notes (Signed)
ED TO INPATIENT HANDOFF REPORT  Name/Age/Gender Alyssa [name] 85 y.o. female  Code Status    Code Status Orders  (From admission, onward)         Start     Ordered   02/20/18 1825  Full code  Continuous     02/20/18 1829        Code Status History    Date Active Date Inactive Code Status Order ID Comments User Context   02/26/2015 1611 02/26/2015 2117 Full Code [id number]  [name], MD Inpatient   02/26/2015 1611 02/26/2015 1611 Full Code [id number]  [name], MD Inpatient    Advance Directive Documentation     Most Recent Value  Type of Advance Directive  Healthcare Power of Attorney, Living will  Pre-existing out of facility DNR order (yellow form or pink MOST form)  -  "MOST" Form in Place?  -      Home/SNF/Other Home  Chief Complaint abnormal labs   Level of Care/Admitting Diagnosis ED Disposition    ED Disposition Condition [place]: [place] [[id number]]  Level of Care: Stepdown [14]  Admit to SDU based on following criteria: Hemodynamic compromise or significant risk of instability:  Patient requiring short term acute titration and management of vasoactive drips, and invasive monitoring (i.e., CVP and Arterial line).  Diagnosis: Hyponatremia [[id number]]  Admitting Physician: Eugenie Filler [[id number]]  Attending Physician: Eugenie Filler [[id number]]  Estimated length of stay: past midnight tomorrow  Certification:: I certify this patient will need inpatient services for at least 2 midnights  PT Class (Do Not Modify): Inpatient [101]  PT Acc Code (Do Not Modify): Private [1]       Medical History Past Medical History:  Diagnosis Date  . Atrial fibrillation ([place])    pacemaker, chronic anticoag  . Breast cancer ([place])   . Diverticulosis   . Fibroma    inner lips/mouth  . GERD (gastroesophageal reflux disease)   . Hx of breast cancer   . Hyperkalemia   . Hypertension   . Hypothyroidism   . IBS  (irritable bowel syndrome)   . Internal hemorrhoids   . Left inguinal hernia    direct  . MVP (mitral valve prolapse)   . Osteoporosis   . Renal insufficiency   . Thyroid cancer ([place])     Allergies No Known Allergies  IV Location/Drains/Wounds Patient Lines/Drains/Airways Status   Active Line/Drains/Airways    Name:   Placement date:   Placement time:   Site:   Days:   Peripheral IV 02/12/18 Left Antecubital   02/12/18    1046    Antecubital   8   Peripheral IV 02/20/18 Left Forearm   02/20/18    1553    Forearm   less than 1   External Urinary Catheter   02/20/18    1902    -   less than 1          Labs/Imaging Results for orders placed or performed during the hospital encounter of 02/20/18 (from the past 48 hour(s))  Osmolality, urine     Status: Abnormal   Collection Time: 02/20/18  3:18 PM  Result Value Ref Range   Osmolality, Ur 229 (L) 300 - 900 mOsm/kg    Comment: Performed at [facility], [place] [address] [place], [place] [address]  Comprehensive metabolic panel     Status: Abnormal   Collection Time: 02/20/18  3:29 PM  Result Value Ref Range   Sodium 122 (L) 135 - 145 mmol/L   Potassium 4.3 3.5 - 5.1 mmol/L   Chloride 88 (L) 98 - 111 mmol/L   CO2 22 22 - 32 mmol/L   Glucose, Bld 107 (H) 70 - 99 mg/dL   BUN 21 8 - 23 mg/dL   Creatinine, Ser 1.13 (H) 0.44 - 1.00 mg/dL   Calcium 8.8 (L) 8.9 - 10.3 mg/dL   Total Protein 7.3 6.5 - 8.1 g/dL   Albumin 4.7 3.5 - 5.0 g/dL   AST 29 15 - 41 U/L   ALT 21 0 - 44 U/L   Alkaline Phosphatase 63 38 - 126 U/L   Total Bilirubin 1.0 0.3 - 1.2 mg/dL   GFR calc non Af Amer 42 (L) >60 mL/min   GFR calc Af Amer 49 (L) >60 mL/min   Anion gap 12 5 - 15    Comment: Performed at [facility], [place] [address], [place], [place] [address]  CBC with Differential     Status: Abnormal   Collection Time: 02/20/18  3:29 PM  Result Value Ref Range   WBC 5.1 4.0 - 10.5 K/uL   RBC 4.14 3.87 - 5.11 MIL/uL    Hemoglobin 12.7 12.0 - 15.0 g/dL   HCT 37.8 36.0 - 46.0 %   MCV 91.3 80.0 - 100.0 fL   MCH 30.7 26.0 - 34.0 pg   MCHC 33.6 30.0 - 36.0 g/dL   RDW 11.0 (L) 11.5 - 15.5 %   Platelets 259 150 - 400 K/uL   nRBC 0.0 0.0 - 0.2 %   Neutrophils Relative % 63 %   Neutro Abs 3.2 1.7 - 7.7 K/uL   Lymphocytes Relative 28 %   Lymphs Abs 1.4 0.7 - 4.0 K/uL   Monocytes Relative 8 %   Monocytes Absolute 0.4 0.1 - 1.0 K/uL   Eosinophils Relative 1 %   Eosinophils Absolute 0.1 0.0 - 0.5 K/uL   Basophils Relative 0 %   Basophils Absolute 0.0 0.0 - 0.1 K/uL   Immature Granulocytes 0 %   Abs Immature Granulocytes 0.02 0.00 - 0.07 K/uL    Comment: Performed at [facility], [place] [address], [place], [place] [address]  Osmolality     Status: Abnormal   Collection Time: 02/20/18  3:54 PM  Result Value Ref Range   Osmolality 257 (L) 275 - 295 mOsm/kg    Comment: Performed at [facility], [place] [address], [place], [place] [address]  Protime-INR     Status: Abnormal   Collection Time: 02/20/18  3:54 PM  Result Value Ref Range   Prothrombin Time 29.7 (H) 11.4 - 15.2 seconds   INR 2.88     Comment: Performed at [facility], [place] [address], [place], [place] [address]  Urinalysis, Routine w reflex microscopic     Status: Abnormal   Collection Time: 02/20/18  4:19 PM  Result Value Ref Range   Color, Urine STRAW (A) YELLOW   APPearance CLEAR CLEAR   Specific Gravity, Urine 1.006 1.005 - 1.030   pH 5.0 5.0 - 8.0   Glucose, UA NEGATIVE NEGATIVE mg/dL   Hgb urine dipstick SMALL (A) NEGATIVE   Bilirubin Urine NEGATIVE NEGATIVE   Ketones, ur NEGATIVE NEGATIVE mg/dL   Protein, ur NEGATIVE NEGATIVE mg/dL   Nitrite NEGATIVE NEGATIVE   Leukocytes, UA LARGE (A) NEGATIVE   RBC / HPF 0-5 0 - 5 RBC/hpf   WBC, UA  11-20 0 - 5 WBC/hpf   Bacteria, UA RARE (A) NONE SEEN   Squamous Epithelial / LPF 0-5 0 - 5   Hyaline Casts, UA PRESENT    Non Squamous Epithelial 6-10 (A)  NONE SEEN    Comment: Performed at [facility], [place] [address], [place], [place] [address]  Sodium, urine, random     Status: None   Collection Time: 02/20/18  8:19 PM  Result Value Ref Range   Sodium, Ur 37 mmol/L    Comment: Performed at [facility], [place] [address] [place], [place] [address]  Creatinine, urine, random     Status: None   Collection Time: 02/20/18  8:19 PM  Result Value Ref Range   Creatinine, Urine 40.81 mg/dL    Comment: Performed at [facility], [place] [address], [place],  [address]   Dg Chest 2 View  Result Date: 02/20/2018 CLINICAL DATA:  Weakness, irritable bowel symptoms for 1 week, history atrial fibrillation, breast cancer, GERD, hypertension EXAM: CHEST - 2 VIEW COMPARISON:  02/12/2018 FINDINGS: Left subclavian sequential transvenous pacemaker leads project at right atrium and right ventricle. Enlargement of cardiac silhouette. Atherosclerotic calcification aorta. Mediastinal contours and pulmonary vascularity normal. Emphysematous changes without infiltrate, pleural effusion, or pneumothorax. Post RIGHT mastectomy, RIGHT axillary node dissection and breast prosthesis. Bones demineralized. IMPRESSION: Enlargement of cardiac silhouette post pacemaker. COPD changes without infiltrate. Electronically Signed   By: Lavonia Dana M.D.   On: 02/20/2018 18:53   EKG Interpretation  Date/Time:  Monday February 20 2018 16:32:01 EST Ventricular Rate:  71 PR Interval:    QRS Duration: 153 QT Interval:  442 QTC Calculation: 481 R Axis:   -84 Text Interpretation:  Ventricular-paced rhythm No further analysis attempted due to paced rhythm Confirmed by Veryl Speak [phone]) on 02/20/2018 5:54:07 PM   Pending Labs Unresulted Labs (From admission, onward)    Start     Ordered   02/21/18 0500  Magnesium  Tomorrow morning,   R     02/20/18 1829   02/21/18 0500  TSH  Tomorrow morning,   R     02/20/18 1829    02/21/18 0500  Comprehensive metabolic panel  Tomorrow morning,   R     02/20/18 1829   02/21/18 0500  CBC  Tomorrow morning,   R     02/20/18 1829   02/21/18 0500  Cortisol  Tomorrow morning,   R     02/20/18 1829   02/21/18 0500  Protime-INR  Daily,   R     02/20/18 1835   02/21/18 0500  T4, free  Tomorrow morning,   R     02/20/18 2010   02/20/18 2011  Osmolality  Once,   R     02/20/18 2010   02/20/18 2011  Osmolality, urine  Once,   R     02/20/18 2010   02/20/18 1814  Urine culture  Add-on,   STAT     02/20/18 1813   02/20/18 1620  Troponin I - Add-On to previous collection  Add-on,   STAT     02/20/18 1619          Vitals/Pain Today's Vitals   02/20/18 1911 02/20/18 1930 02/20/18 2000 02/20/18 2130  BP: (!) 129/56 (!) 145/67 (!) 143/69 (!) 113/50  Pulse: 62 63 61 62  Resp: 14 (!) 22 11 17   Temp:      TempSrc:      SpO2: 97% 98% 98%  98%  Weight:      Height:      PainSc:        Isolation Precautions No active isolations  Medications Medications  cefTRIAXone (ROCEPHIN) 1 g in sodium chloride 0.9 % 100 mL IVPB (0 g Intravenous Stopped 02/20/18 1901)  0.9 %  sodium chloride infusion ( Intravenous New Bag/Given 02/20/18 1831)  rifaximin (XIFAXAN) tablet 550 mg (has no administration in time range)  levothyroxine (SYNTHROID, LEVOTHROID) tablet 88 mcg (has no administration in time range)  clonazePAM (KLONOPIN) tablet 0.5 mg (has no administration in time range)  sodium chloride flush (NS) 0.9 % injection 3 mL (has no administration in time range)  acetaminophen (TYLENOL) tablet 650 mg (650 mg Oral Given 02/20/18 2140)    Or  acetaminophen (TYLENOL) suppository 650 mg ( Rectal See Alternative 02/20/18 2140)  senna-docusate (Senokot-S) tablet 1 tablet (has no administration in time range)  sorbitol 70 % solution 30 mL (has no administration in time range)  sodium phosphate (FLEET) 7-19 GM/118ML enema 1 enema (has no administration in time range)  ondansetron (ZOFRAN)  tablet 4 mg (has no administration in time range)    Or  ondansetron (ZOFRAN) injection 4 mg (has no administration in time range)  levalbuterol (XOPENEX) nebulizer solution 0.63 mg (has no administration in time range)  sodium chloride 0.9 % bolus 500 mL (0 mLs Intravenous Stopped 02/20/18 1659)    Mobility walks with person assist

## 2018-02-21 ENCOUNTER — Other Ambulatory Visit: Payer: Self-pay

## 2018-02-21 DIAGNOSIS — D649 Anemia, unspecified: Secondary | ICD-10-CM

## 2018-02-21 DIAGNOSIS — D638 Anemia in other chronic diseases classified elsewhere: Secondary | ICD-10-CM | POA: Diagnosis present

## 2018-02-21 DIAGNOSIS — E440 Moderate protein-calorie malnutrition: Secondary | ICD-10-CM

## 2018-02-21 LAB — CBC
HCT: 32.4 % — ABNORMAL LOW (ref 36.0–46.0)
Hemoglobin: 10.9 g/dL — ABNORMAL LOW (ref 12.0–15.0)
MCH: 30.1 pg (ref 26.0–34.0)
MCHC: 33.6 g/dL (ref 30.0–36.0)
MCV: 89.5 fL (ref 80.0–100.0)
Platelets: 220 10*3/uL (ref 150–400)
RBC: 3.62 MIL/uL — ABNORMAL LOW (ref 3.87–5.11)
RDW: 11 % — ABNORMAL LOW (ref 11.5–15.5)
WBC: 4.7 10*3/uL (ref 4.0–10.5)
nRBC: 0 % (ref 0.0–0.2)

## 2018-02-21 LAB — BASIC METABOLIC PANEL
ANION GAP: 10 (ref 5–15)
BUN: 17 mg/dL (ref 8–23)
CALCIUM: 8.1 mg/dL — AB (ref 8.9–10.3)
CO2: 22 mmol/L (ref 22–32)
Chloride: 97 mmol/L — ABNORMAL LOW (ref 98–111)
Creatinine, Ser: 0.9 mg/dL (ref 0.44–1.00)
GFR calc Af Amer: >60 mL/min (ref >60)
GFR calc non Af Amer: 56 mL/min — ABNORMAL LOW (ref >60)
GLUCOSE: 95 mg/dL (ref 70–99)
Potassium: 4.2 mmol/L (ref 3.5–5.1)
Sodium: 129 mmol/L — ABNORMAL LOW (ref 135–145)

## 2018-02-21 LAB — COMPREHENSIVE METABOLIC PANEL
ALT: 19 U/L (ref 0–44)
AST: 23 U/L (ref 15–41)
Albumin: 3.5 g/dL (ref 3.5–5.0)
Alkaline Phosphatase: 48 U/L (ref 38–126)
Anion gap: 10 (ref 5–15)
BUN: 20 mg/dL (ref 8–23)
CO2: 22 mmol/L (ref 22–32)
Calcium: 8.2 mg/dL — ABNORMAL LOW (ref 8.9–10.3)
Chloride: 94 mmol/L — ABNORMAL LOW (ref 98–111)
Creatinine, Ser: 1.02 mg/dL — ABNORMAL HIGH (ref 0.44–1.00)
GFR calc Af Amer: 56 mL/min — ABNORMAL LOW (ref >60)
GFR calc non Af Amer: 48 mL/min — ABNORMAL LOW (ref >60)
Glucose, Bld: 89 mg/dL (ref 70–99)
POTASSIUM: 3.9 mmol/L (ref 3.5–5.1)
Sodium: 126 mmol/L — ABNORMAL LOW (ref 135–145)
Total Bilirubin: 0.7 mg/dL (ref 0.3–1.2)
Total Protein: 5.7 g/dL — ABNORMAL LOW (ref 6.5–8.1)

## 2018-02-21 LAB — IRON AND TIBC
Iron: 64 ug/dL (ref 28–170)
Saturation Ratios: 22 % (ref 10.4–31.8)
TIBC: 296 ug/dL (ref 250–450)
UIBC: 232 ug/dL

## 2018-02-21 LAB — GLUCOSE, CAPILLARY: Glucose-Capillary: 104 mg/dL — ABNORMAL HIGH (ref 70–99)

## 2018-02-21 LAB — PROTIME-INR
INR: 2.94
Prothrombin Time: 30.2 seconds — ABNORMAL HIGH (ref 11.4–15.2)

## 2018-02-21 LAB — TROPONIN I: Troponin I: <0.03 ng/mL (ref <0.03)

## 2018-02-21 LAB — VITAMIN B12: Vitamin B-12: 586 pg/mL (ref 180–914)

## 2018-02-21 LAB — FOLATE: Folate: 16.8 ng/mL (ref >5.9)

## 2018-02-21 LAB — MRSA PCR SCREENING: MRSA by PCR: NEGATIVE

## 2018-02-21 LAB — FERRITIN: FERRITIN: 104 ng/mL (ref 11–307)

## 2018-02-21 LAB — OSMOLALITY, URINE: Osmolality, Ur: 230 mOsm/kg — ABNORMAL LOW (ref 300–900)

## 2018-02-21 LAB — T4, FREE: Free T4: 1.47 ng/dL (ref 0.82–1.77)

## 2018-02-21 LAB — CORTISOL: Cortisol, Plasma: 7 ug/dL

## 2018-02-21 LAB — OSMOLALITY: Osmolality: 268 mOsm/kg — ABNORMAL LOW (ref 275–295)

## 2018-02-21 LAB — MAGNESIUM: Magnesium: 1.7 mg/dL (ref 1.7–2.4)

## 2018-02-21 LAB — TSH: TSH: 0.748 u[IU]/mL (ref 0.350–4.500)

## 2018-02-21 MED ORDER — WARFARIN SODIUM 1 MG PO TABS
1.0000 mg | ORAL_TABLET | Freq: Once | ORAL | Status: AC
  Administered 2018-02-21: 1 mg via ORAL
  Filled 2018-02-21: qty 1

## 2018-02-21 MED ORDER — MAGNESIUM SULFATE 4 GM/100ML IV SOLN
4.0000 g | Freq: Once | INTRAVENOUS | Status: AC
  Administered 2018-02-21: 4 g via INTRAVENOUS
  Filled 2018-02-21: qty 100

## 2018-02-21 MED ORDER — POLYVINYL ALCOHOL 1.4 % OP SOLN
1.0000 [drp] | OPHTHALMIC | Status: DC | PRN
  Administered 2018-02-21: 1 [drp] via OPHTHALMIC
  Filled 2018-02-21: qty 15

## 2018-02-21 MED ORDER — WARFARIN - PHARMACIST DOSING INPATIENT: Freq: Every day | Status: DC

## 2018-02-21 MED ORDER — ADULT MULTIVITAMIN W/MINERALS CH
1.0000 | ORAL_TABLET | Freq: Every day | ORAL | Status: DC
  Administered 2018-02-21 – 2018-02-22 (×2): 1 via ORAL
  Filled 2018-02-21 (×2): qty 1

## 2018-02-21 MED ORDER — LIP MEDEX EX OINT
TOPICAL_OINTMENT | CUTANEOUS | Status: DC | PRN
  Filled 2018-02-21: qty 7

## 2018-02-21 NOTE — Progress Notes (Signed)
Initial Nutrition Assessment  DOCUMENTATION CODES:   Non-severe (moderate) malnutrition in context of chronic illness  INTERVENTION:  - Diet liberalization from Heart Healthy to Regular. - Will order daily multivitamin with minerals. - Continue to encourage PO intakes.    NUTRITION DIAGNOSIS:   Moderate Malnutrition related to chronic illness(IBS) as evidenced by mild fat depletion, moderate fat depletion, mild muscle depletion, moderate muscle depletion.  GOAL:   Patient will meet greater than or equal to 90% of their needs  MONITOR:   PO intake, Weight trends, Labs, I & O's  REASON FOR ASSESSMENT:   Malnutrition Screening Tool  ASSESSMENT:   85 y.o. female with medical history significant of atrial fibrillation, hx of breast cancer s/p bilateral mastectomy, mitral valve prolapse, s/p PPM, IBS, diverticulosis, history of thyroid cancer s/p thyroidectomy. She presented to the ED from PCPs office with hyponatremia (119 mmol/l). Patient stated she was having diarrhea on and off for the past 3 weeks with associated decreased oral intake. She endorsed some lightheadedness and dizziness over the past week. She was also experiencing difficulty urinating, some abdominal pain, chills, extreme weakness, fatigue, dizziness. Patient reported that she had a syncopal episode ~8 days PTA when she hit her head and was seen in the ED where she had staples placed; she then follow-up with her PCP.  No intakes documented since admission. Patient states that she ate a cup of yogurt for breakfast and asked RD to order lunch: an omelet with swiss cheese and a cup of decaf coffee. Patient reports that she has been on a BRAT diet the past 2.5-3 weeks d/t IBS flare/diarrhea. Reports no diarrhea for the past 2 days. Patient is unaware of any exacerbating factors which caused flare to occur. She often eats that same things each day (such as salmon, chicken, berries, rice) and that this helps keep IBS "in  check." She states that creamy items bother her stomach.   Patient reports having a bad flare of IBS in 2018 and that at that time she lost 25 lb. She reports since weight loss, her weight has been stable at 124-128 lb and that she weighs herself regularly at home. She reports she was at her UBW PTA.   Patient denies any chewing or swallowing issues. She denies any nausea or vomiting PTA. She was having some mild abdominal pain PTA but no issues with this since admission.   Medications reviewed; 88 mcg oral synthroid/day, 2 g IV Mg sulfate x1 run 1/14. Labs reviewed; CBG: 104 mg/dL, Na: 126 mmol/l, Cl: 94 mmol/l, creatinine: 1.02 mg/dl, Ca: 8.2 mg/dl, GFR: 48 ml/min. IVF; NS @ 100 ml/hr.      NUTRITION - FOCUSED PHYSICAL EXAM:    Most Recent Value  Orbital Region  Mild depletion  Upper Arm Region  Mild depletion  Thoracic and Lumbar Region  Unable to assess  Buccal Region  Moderate depletion  Temple Region  Mild depletion  Clavicle Bone Region  Mild depletion  Clavicle and Acromion Bone Region  Moderate depletion  Scapular Bone Region  Unable to assess  Dorsal Hand  Moderate depletion  Patellar Region  Mild depletion  Anterior Thigh Region  Unable to assess  Posterior Calf Region  Moderate depletion  Edema (RD Assessment)  None  Hair  Reviewed  Eyes  Reviewed  Mouth  Reviewed  Skin  Reviewed  Nails  Reviewed       Diet Order:   Diet Order  Diet regular Room service appropriate? Yes; Fluid consistency: Thin  Diet effective now              EDUCATION NEEDS:   No education needs have been identified at this time  Skin:  Skin Assessment: Reviewed RN Assessment  Last BM:  1/12 (day PTA)  Height:   Ht Readings from Last 1 Encounters:  02/21/18 5' 6.5" (1.689 m)    Weight:   Wt Readings from Last 1 Encounters:  02/21/18 43.8 kg    Ideal Body Weight:  60.23 kg  BMI:  Body mass index is 15.35 kg/m.  Estimated Nutritional Needs:   Kcal:   1425-1600 kcal  Protein:  55-65 grams  Fluid:  >/= 1.6 L/day     Jarome Matin, MS, RD, LDN, [facility] Inpatient Clinical Dietitian Pager # [phone] After hours/weekend pager # [phone]

## 2018-02-21 NOTE — Evaluation (Signed)
Physical Therapy Evaluation Patient Details Name: Alyssa [name] MRN: [id number] DOB: 11/11/1924 Today's Date: 02/21/2018   History of Present Illness  85 yo female, independent, admitted 02/20/18 with hyponatremia.  Recent syncopal episode/head laceration. H/O IBS, afib  Clinical Impression  The patient ambulated x 200' with light  1 UE support of IV pole. reorts feeling like she is swaying/slight balance loss. Will assess further next visit. Pt admitted with above diagnosis. Pt currently with functional limitations due to the deficits listed below (see PT Problem List).  Pt will benefit from skilled PT to increase their independence and safety with mobility to allow discharge to the venue listed below.   BP 152/64 after ambulating.    Follow Up Recommendations Home health PT;Supervision/Assistance - 24 hour    Equipment Recommendations  None recommended by PT    Recommendations for Other Services       Precautions / Restrictions Precautions Precautions: Fall      Mobility  Bed Mobility Overal bed mobility: Independent                Transfers Overall transfer level: Needs assistance Equipment used: 1 person hand held assist Transfers: Sit to/from Stand Sit to Stand: Min guard            Ambulation/Gait Ambulation/Gait assistance: Min assist Gait Distance (Feet): 20 Feet(then 200') Assistive device: 1 person hand held assist;IV Pole Gait Pattern/deviations: Step-through pattern     General Gait Details: patient reports intermittently feels " Off balance". Patient tolerated well. Does need light support.   Stairs            Wheelchair Mobility    Modified Rankin (Stroke Patients Only)       Balance Overall balance assessment: Needs assistance   Sitting balance-Leahy Scale: Normal     Standing balance support: During functional activity;No upper extremity supported Standing balance-Leahy Scale: Fair Standing balance comment: for self care in  BR.                             Pertinent Vitals/Pain Pain Assessment: No/denies pain    Home Living Family/patient expects to be discharged to:: Private residence Living Arrangements: Alone Available Help at Discharge: Family Type of Home: (town home) Home Access: Elevator     Home Layout: One level Home Equipment: None      Prior Function Level of Independence: Independent               Hand Dominance        Extremity/Trunk Assessment   Upper Extremity Assessment Upper Extremity Assessment: Defer to OT evaluation    Lower Extremity Assessment Lower Extremity Assessment: Generalized weakness    Cervical / Trunk Assessment Cervical / Trunk Assessment: Normal  Communication   Communication: No difficulties  Cognition Arousal/Alertness: Awake/alert Behavior During Therapy: WFL for tasks assessed/performed Overall Cognitive Status: Within Functional Limits for tasks assessed                                        General Comments      Exercises     Assessment/Plan    PT Assessment Patient needs continued PT services  PT Problem List Decreased strength;Decreased activity tolerance;Decreased balance;Decreased mobility       PT Treatment Interventions Gait training;Functional mobility training;Therapeutic activities;Patient/family education    PT Goals (Current goals can  be found in the Care Plan section)  Acute Rehab PT Goals Patient Stated Goal: to feel stronger and go home PT Goal Formulation: With patient Time For Goal Achievement: 03/07/18 Potential to Achieve Goals: Good    Frequency Min 3X/week   Barriers to discharge        Co-evaluation               AM-PAC PT "6 Clicks" Mobility  Outcome Measure Help needed turning from your back to your side while in a flat bed without using bedrails?: None Help needed moving from lying on your back to sitting on the side of a flat bed without using bedrails?:  None Help needed moving to and from a bed to a chair (including a wheelchair)?: A Little Help needed standing up from a chair using your arms (e.g., wheelchair or bedside chair)?: A Little Help needed to walk in hospital room?: A Little Help needed climbing 3-5 steps with a railing? : A Lot 6 Click Score: 19    End of Session Equipment Utilized During Treatment: Gait belt Activity Tolerance: Patient tolerated treatment well Patient left: in chair;with call bell/phone within reach;with chair alarm set Nurse Communication: Mobility status PT Visit Diagnosis: Unsteadiness on feet (R26.81)    Time: 1100-1133 PT Time Calculation (min) (ACUTE ONLY): 33 min   Charges:   PT Evaluation $PT Eval Low Complexity: 1 Low PT Treatments $Gait Training: 8-22 mins        [place] Pager [phone] Office [phone]    Claretha Cooper 02/21/2018, 1:07 PM

## 2018-02-21 NOTE — Progress Notes (Signed)
Alyssa [name] for warfarin Indication: atrial fibrillation  No Known Allergies  Patient Measurements: Height: 5' 6.5" (168.9 cm) Weight: 96 lb 9 oz (43.8 kg) IBW/kg (Calculated) : 60.45  Vital Signs: Temp: 97.8 F (36.6 C) (01/14 0348) Temp Source: Oral (01/14 0348) BP: 127/59 (01/14 0600) Pulse Rate: 59 (01/14 0600)  Labs: Recent Labs    02/20/18 1339 02/20/18 1529 02/20/18 1554 02/21/18 0232  HGB  --  12.7  --  10.9*  HCT  --  37.8  --  32.4*  PLT  --  259  --  220  LABPROT  --   --  29.7* 30.2*  INR  --   --  2.88 2.94  CREATININE 1.12 1.13*  --  1.02*  TROPONINI  --   --   --  <0.03    Estimated Creatinine Clearance: 24.8 mL/min (A) (by C-G formula based on SCr of 1.02 mg/dL (H)).   Assessment: [name] on coumadin PTA for Afib.  Home dose per coumadin clinic is 3 mg daily except 1.5 mg on Fridays. Admission INR is therapeutic at 2.88.    Today, 02/21/2018: INR 2.94 CBC: Hgb down to 10.9, Plt remain WNL. No bleeding or complications reported. No major drug-drug interactions noted.  Goal of Therapy:  INR 2-3 Monitor platelets by anticoagulation protocol: Yes   Plan:  Warfarin 1 mg PO x 1 at 1800 Daily PT/INR. Monitor for signs and symptoms of bleeding.   Gretta Arab PharmD, BCPS Pager [phone] 02/21/2018 9:04 AM

## 2018-02-21 NOTE — Progress Notes (Signed)
PROGRESS NOTE    Alyssa [name]  NOB:[id number] DOB: March 31, 1924 DOA: 02/20/2018 PCP: [name], MD    Brief Narrative:  Patient is a 85 year old female with history of atrial fibrillation on chronic anticoagulation therapy, history of breast cancer status post bilateral mastectomy, mitral valve prolapse, status post PPM, IBD, diverticulosis, history of thyroid cancer status post thyroidectomy presented to the ED from PCPs office with severe hyponatremia with sodium of 119.   Assessment & Plan:   Principal Problem:   Hyponatremia Active Problems:   Acute lower UTI   Hypothyroidism   GERD   Irritable bowel syndrome   Pacemaker-dual chamber    Chronic renal insufficiency, stage III (moderate) (HCC)    atrial fibrillation   Complete heart block (HCC)   Essential hypertension, benign   GAD (generalized anxiety disorder)   (HFpEF) heart failure with preserved ejection fraction (HCC)   Seasonal allergic rhinitis due to pollen   IBS (irritable bowel syndrome)   Anemia  1 severe hyponatremia Patient noted to have a severe hyponatremia at PCPs office with a sodium of 119.  Repeat sodium in the ED with a sodium of 122.  Likely secondary to hypovolemic hyponatremia as patient with a 3-week history of diarrhea off and on secondary to IBD, on Benicar HCTZ as well as Lasix.  Patient also noted with poor oral intake.  Urine osmolality 230, urine sodium 37, urine creatinine 40.81.  Urinalysis with large leukocytes nitrite negative specific gravity 1.006.  Cortisol level drawn around 2:32 AM was 7.0.  Sodium levels improving with hydration.  HCTZ and Lasix have been discontinued.  Will likely not resume HCTZ on discharge.  Check sodium level this afternoon and if improved could decrease IV fluids to 75 cc/h.  Supportive care.  Follow.    2.  Gastroesophageal reflux disease Continue PPI.  3.  Chronic kidney disease stage III Stable.  Follow.  4.  Complete heart block status post  PPM Stable.  5.  Atrial fibrillation on chronic anticoagulation/status post PPM V paced.    INR of 2.94.  Coumadin per pharmacy.  6.  IBS Continue Xifaxan.  7.  Urinary tract infection Urine cultures pending.  Continue IV Rocephin.    8.  Status post thyroidectomy TSH and free T4 within normal limits.  Continue home dose Synthroid.  9.  Anemia Likely dilutional.  Patient with no overt bleeding.  Check an anemia panel.  Follow H&H.   DVT prophylaxis: Coumadin Code Status: Full Family Communication: Updated patient.  No family at bedside. Disposition Plan: Home once clinically improved and medically stable with resolution of hyponatremia.   Consultants:   None  Procedures:   Chest x-ray 02/20/2018  Antimicrobials:   IV Rocephin 02/20/2018   Subjective: Laying in bed.  Feeling better.  Denies any dysuria.  Urinary urgency improving.  No chest pain.  No shortness of breath.  Objective: Vitals:   02/21/18 0100 02/21/18 0348 02/21/18 0550 02/21/18 0600  BP: 135/65  (!) 148/61 (!) 127/59  Pulse: 60  69 (!) 59  Resp: 20  15 18   Temp:  97.8 F (36.6 C)    TempSrc:  Oral    SpO2: 99%  97% 96%  Weight:      Height:        Intake/Output Summary (Last 24 hours) at 02/21/2018 0907 Last data filed at 02/21/2018 0600 Gross per 24 hour  Intake 1667.18 ml  Output 1000 ml  Net 667.18 ml   [employer]  02/20/18 1449 02/21/18 0054  Weight: 57 kg 43.8 kg    Examination:  General exam: Appears calm and comfortable  Respiratory system: Clear to auscultation. Respiratory effort normal. Cardiovascular system: S1 & S2 heard, RRR. No JVD, murmurs, rubs, gallops or clicks. No pedal edema. Gastrointestinal system: Abdomen is nondistended, soft and nontender. No organomegaly or masses felt. Normal bowel sounds heard. Central nervous system: Alert and oriented. No focal neurological deficits. Extremities: Symmetric 5 x 5 power. Skin: No rashes, lesions or  ulcers Psychiatry: Judgement and insight appear normal. Mood & affect appropriate.     Data Reviewed: I have personally reviewed following labs and imaging studies  CBC: Recent Labs  Lab 02/20/18 1529 02/21/18 0232  WBC 5.1 4.7  NEUTROABS 3.2  --   HGB 12.7 10.9*  HCT 37.8 32.4*  MCV 91.3 89.5  PLT 259 852   Basic Metabolic Panel: Recent Labs  Lab 02/20/18 1339 02/20/18 1529 02/21/18 0232  NA 119* 122* 126*  K 4.2 4.3 3.9  CL 86* 88* 94*  CO2 25 22 22   GLUCOSE 104* 107* 89  BUN 20 21 20   CREATININE 1.12 1.13* 1.02*  CALCIUM 8.9 8.8* 8.2*  MG  --   --  1.7   GFR: Estimated Creatinine Clearance: 24.8 mL/min (A) (by C-G formula based on SCr of 1.02 mg/dL (H)). Liver Function Tests: Recent Labs  Lab 02/20/18 1529 02/21/18 0232  AST 29 23  ALT 21 19  ALKPHOS 63 48  BILITOT 1.0 0.7  PROT 7.3 5.7*  ALBUMIN 4.7 3.5   No results for input(s): LIPASE, AMYLASE in the last 168 hours. No results for input(s): AMMONIA in the last 168 hours. Coagulation Profile: Recent Labs  Lab 02/20/18 1554 02/21/18 0232  INR 2.88 2.94   Cardiac Enzymes: Recent Labs  Lab 02/21/18 0232  TROPONINI <0.03   BNP (last 3 results) No results for input(s): PROBNP in the last 8760 hours. HbA1C: Recent Labs    02/20/18 1339  HGBA1C 6.0   CBG: Recent Labs  Lab 02/21/18 0849  GLUCAP 104*   Lipid Profile: No results for input(s): CHOL, HDL, LDLCALC, TRIG, CHOLHDL, LDLDIRECT in the last 72 hours. Thyroid Function Tests: Recent Labs    02/21/18 0232  TSH 0.748  FREET4 1.47   Anemia Panel: Recent Labs    02/21/18 0232  VITAMINB12 586  FOLATE 16.8  FERRITIN 104  TIBC 296  IRON 64   Sepsis Labs: No results for input(s): PROCALCITON, LATICACIDVEN in the last 168 hours.  Recent Results (from the past 240 hour(s))  MRSA PCR Screening     Status: None   Collection Time: 02/21/18  3:30 AM  Result Value Ref Range Status   MRSA by PCR NEGATIVE NEGATIVE Final     Comment:        The GeneXpert MRSA Assay (FDA approved for NASAL specimens only), is one component of a comprehensive MRSA colonization surveillance program. It is not intended to diagnose MRSA infection nor to guide or monitor treatment for MRSA infections. Performed at [facility], [place] [address] [place], [place] [address]          Radiology Studies: Dg Chest 2 View  Result Date: 02/20/2018 CLINICAL DATA:  Weakness, irritable bowel symptoms for 1 week, history atrial fibrillation, breast cancer, GERD, hypertension EXAM: CHEST - 2 VIEW COMPARISON:  02/12/2018 FINDINGS: Left subclavian sequential transvenous pacemaker leads project at right atrium and right ventricle. Enlargement of cardiac silhouette. Atherosclerotic calcification aorta. Mediastinal contours  and pulmonary vascularity normal. Emphysematous changes without infiltrate, pleural effusion, or pneumothorax. Post RIGHT mastectomy, RIGHT axillary node dissection and breast prosthesis. Bones demineralized. IMPRESSION: Enlargement of cardiac silhouette post pacemaker. COPD changes without infiltrate. Electronically Signed   By: Lavonia Dana M.D.   On: 02/20/2018 18:53        Scheduled Meds: . levothyroxine  88 mcg Oral Q0600  . rifaximin  550 mg Oral TID  . sodium chloride flush  3 mL Intravenous Q12H  . warfarin  1 mg Oral ONCE-1800  . Warfarin - Pharmacist Dosing Inpatient   Does not apply q1800   Continuous Infusions: . sodium chloride 100 mL/hr at 02/21/18 0556  . cefTRIAXone (ROCEPHIN)  IV Stopped (02/20/18 1901)  . magnesium sulfate 1 - 4 g bolus IVPB       LOS: 1 day    Time spent: 40 minutes    Irine Seal, MD [facility]  If 7PM-7AM, please contact night-coverage [email or web address] Password [email or web address] 02/21/2018, 9:07 AM

## 2018-02-21 NOTE — Progress Notes (Signed)
Patient transferred from ICU via wheelchair. Patent has no pain 0/10.   Patient has been orientated to the room, unit, and staff.  Orders have been reviewed and implemented. Will continue to monitor the patient. Call light has been placed within reach and bed alarm has been activated.   Tawni Carnes, RN Phone number: [phone]

## 2018-02-22 ENCOUNTER — Telehealth: Payer: Self-pay | Admitting: Internal Medicine

## 2018-02-22 LAB — CBC
HCT: 31.9 % — ABNORMAL LOW (ref 36.0–46.0)
Hemoglobin: 10.5 g/dL — ABNORMAL LOW (ref 12.0–15.0)
MCH: 30.9 pg (ref 26.0–34.0)
MCHC: 32.9 g/dL (ref 30.0–36.0)
MCV: 93.8 fL (ref 80.0–100.0)
Platelets: 193 10*3/uL (ref 150–400)
RBC: 3.4 MIL/uL — ABNORMAL LOW (ref 3.87–5.11)
RDW: 11.4 % — ABNORMAL LOW (ref 11.5–15.5)
WBC: 4.2 10*3/uL (ref 4.0–10.5)
nRBC: 0 % (ref 0.0–0.2)

## 2018-02-22 LAB — BASIC METABOLIC PANEL
Anion gap: 12 (ref 5–15)
BUN: 19 mg/dL (ref 8–23)
CHLORIDE: 99 mmol/L (ref 98–111)
CO2: 21 mmol/L — ABNORMAL LOW (ref 22–32)
Calcium: 8 mg/dL — ABNORMAL LOW (ref 8.9–10.3)
Creatinine, Ser: 0.94 mg/dL (ref 0.44–1.00)
GFR calc Af Amer: >60 mL/min (ref >60)
GFR calc non Af Amer: 53 mL/min — ABNORMAL LOW (ref >60)
Glucose, Bld: 96 mg/dL (ref 70–99)
Potassium: 4.4 mmol/L (ref 3.5–5.1)
Sodium: 132 mmol/L — ABNORMAL LOW (ref 135–145)

## 2018-02-22 LAB — PROTIME-INR
INR: 2.14
PROTHROMBIN TIME: 23.7 s — AB (ref 11.4–15.2)

## 2018-02-22 LAB — GLUCOSE, CAPILLARY: Glucose-Capillary: 90 mg/dL (ref 70–99)

## 2018-02-22 LAB — URINE CULTURE

## 2018-02-22 LAB — CORTISOL: Cortisol, Plasma: 11.6 ug/dL

## 2018-02-22 LAB — MAGNESIUM: Magnesium: 2.2 mg/dL (ref 1.7–2.4)

## 2018-02-22 MED ORDER — WARFARIN SODIUM 3 MG PO TABS: 3.0000 mg | ORAL_TABLET | Freq: Once | ORAL | Status: DC

## 2018-02-22 NOTE — Telephone Encounter (Signed)
We are getting sampLes of thIs RX tomorrow. Please advise on PA

## 2018-02-22 NOTE — Telephone Encounter (Signed)
Copied from [place] [id number]. Topic: Quick Communication - See Telephone Encounter >> Feb 22, 2018  2:32 PM Bea Graff, NT wrote: CRM for notification. See Telephone encounter for: 02/22/18. Pts daughter calling and states that the rifaximin (XIFAXAN) 550 MG TABS tablet requires PA. She states the medicine is $1000 without PA. Pts daughter also would like to know what they can do in the mean time while the PA is taking place so that her mom may be able to get some of the medication to take? Please advise.

## 2018-02-22 NOTE — Progress Notes (Signed)
ANTICOAGULATION CONSULT NOTE   Pharmacy Consult for warfarin Indication: atrial fibrillation  No Known Allergies  Patient Measurements: Height: 5' 6.5" (168.9 cm) Weight: 132 lb 14.4 oz (60.3 kg) IBW/kg (Calculated) : 60.45  Vital Signs: Temp: 97.5 F (36.4 C) (01/15 0637) Temp Source: Oral (01/15 0637) BP: 143/63 (01/15 0637) Pulse Rate: 61 (01/15 0637)  Labs: Recent Labs    02/20/18 1529 02/20/18 1554 02/21/18 0232 02/21/18 1443 02/22/18 0600  HGB 12.7  --  10.9*  --  10.5*  HCT 37.8  --  32.4*  --  31.9*  PLT 259  --  220  --  193  LABPROT  --  29.7* 30.2*  --  23.7*  INR  --  2.88 2.94  --  2.14  CREATININE 1.13*  --  1.02* 0.90 0.94  TROPONINI  --   --  <0.03  --   --    Estimated Creatinine Clearance: 37.1 mL/min (by C-G formula based on SCr of 0.94 mg/dL).  Assessment: [name] on coumadin PTA for Afib.  Home dose per coumadin clinic is 3 mg daily except 1.5 mg on Fridays. Admission INR is therapeutic at 2.88.    Today, 02/22/2018: INR 2.14 CBC: Hgb down to 10.5, Plt remain WNL, though decreased No bleeding or complications reported, tolerating diet Rocephin may increase INR  Goal of Therapy:  INR 2-3 Monitor platelets by anticoagulation protocol: Yes   Plan:  Warfarin 3mg  today Daily PT/INR. Monitor for signs and symptoms of bleeding.  Minda Ditto PharmD Pager [phone] 02/22/2018, 10:48 AM

## 2018-02-22 NOTE — Evaluation (Signed)
Occupational Therapy Evaluation Patient Details Name: Alyssa [name] MRN: [id number] DOB: 1924/03/18 Today's Date: 02/22/2018    History of Present Illness 85 yo female, independent, admitted 02/20/18 with hyponatremia.  Recent syncopal episode/head laceration. H/O IBS, afib   Clinical Impression   Pt admitted with hyponatremia. Pt currently with functional limitations due to the deficits listed below (see OT Problem List).  Pt will benefit from skilled OT to increase their safety and independence with ADL and functional mobility for ADL to facilitate discharge to venue listed below.     Follow Up Recommendations  Home health OT;Supervision/Assistance - 24 hour    Equipment Recommendations  None recommended by OT    Recommendations for Other Services       Precautions / Restrictions Precautions Precautions: Fall      Mobility Bed Mobility Overal bed mobility: Independent                Transfers Overall transfer level: Needs assistance Equipment used: 1 person hand held assist Transfers: Sit to/from Stand;Stand Pivot Transfers Sit to Stand: Supervision Stand pivot transfers: Supervision            Balance Overall balance assessment: Needs assistance   Sitting balance-Leahy Scale: Normal     Standing balance support: During functional activity;No upper extremity supported Standing balance-Leahy Scale: Good                             ADL either performed or assessed with clinical judgement   ADL Overall ADL's : Needs assistance/impaired                                       General ADL Comments: Pt overall S with ADL activity .  Daugther will provide S as well as neighbors as pt regaining strength. Educated on safety strategies with ADL activity . Pt veralized and demonstrated understanding.     Vision Patient Visual Report: No change from baseline              Pertinent Vitals/Pain Pain Assessment: No/denies pain      Hand Dominance     Extremity/Trunk Assessment Upper Extremity Assessment Upper Extremity Assessment: Overall WFL for tasks assessed           Communication Communication Communication: No difficulties   Cognition Arousal/Alertness: Awake/alert Behavior During Therapy: WFL for tasks assessed/performed Overall Cognitive Status: Within Functional Limits for tasks assessed                                                Home Living Family/patient expects to be discharged to:: Private residence Living Arrangements: Alone Available Help at Discharge: Family Type of Home: (town home) Home Access: Elevator     Home Layout: One level     Bathroom Shower/Tub: Chief Strategy Officer: None          Prior Functioning/Environment Level of Independence: Independent                 OT Problem List: Impaired balance (sitting and/or standing);Decreased strength      OT Treatment/Interventions:      OT Goals(Current goals can be found in the care plan section)  Acute Rehab OT Goals Patient Stated Goal: to feel stronger and go home  OT Frequency:      AM-PAC OT "6 Clicks" Daily Activity     Outcome Measure Help from another person eating meals?: None Help from another person taking care of personal grooming?: None Help from another person toileting, which includes using toliet, bedpan, or urinal?: A Little Help from another person bathing (including washing, rinsing, drying)?: A Little Help from another person to put on and taking off regular upper body clothing?: None Help from another person to put on and taking off regular lower body clothing?: A Little 6 Click Score: 21   End of Session Equipment Utilized During Treatment: Gait belt Nurse Communication: Mobility status  Activity Tolerance: Patient tolerated treatment well Patient left: in chair  OT Visit Diagnosis: Unsteadiness on feet (R26.81);Muscle weakness  (generalized) (M62.81)                Time: 4742-5956 OT Time Calculation (min): 10 min Charges:  OT General Charges $OT Visit: 1 Visit OT Evaluation $OT Eval Low Complexity: 1 Low  Kari Baars, OT Acute Rehabilitation Services [phone] Office- [phone], Edwena Felty D 02/22/2018, 2:19 PM

## 2018-02-22 NOTE — Discharge Summary (Signed)
Physician Discharge Summary  JAZIYAH GRADEL BTD:[id number] DOB: August 11, 1924 DOA: 02/20/2018  PCP: [name], MD  Admit date: 02/20/2018 Discharge date: 02/22/2018  Time spent: 40 minutes  Recommendations for Outpatient Follow-up:  1. Follow outpatient CBC/CMP 2. Stop HCTZ and lasix - follow Na as outpatient -- would not resume HCTZ, could consider resuming lasix based on volume status 3. Cortisol not suggestive of adrenal insufficiency at 11.6, could consider cort stim, if additional concern, but as improving not sure this is needed    Discharge Diagnoses:  Principal Problem:   Hyponatremia Active Problems:   Hypothyroidism   GERD   Irritable bowel syndrome   Pacemaker-dual chamber    Chronic renal insufficiency, stage III (moderate) (HCC)    atrial fibrillation   Complete heart block (HCC)   Essential hypertension, benign   GAD (generalized anxiety disorder)   (HFpEF) heart failure with preserved ejection fraction (HCC)   Seasonal allergic rhinitis due to pollen   IBS (irritable bowel syndrome)   Acute lower UTI   Anemia   Malnutrition of moderate degree   Discharge Condition: stable  Diet recommendation: heart healthy  Filed Weights   02/20/18 1449 02/21/18 0054 02/22/18 0459  Weight: 57 kg 43.8 kg 60.3 kg    History of present illness:  Patient is Alyssa [name] 85 year old female with history of atrial fibrillation on chronic anticoagulation therapy, history of breast cancer status post bilateral mastectomy, mitral valve prolapse, status post PPM, IBD, diverticulosis, history of thyroid cancer status post thyroidectomy presented to the ED from PCPs office with severe hyponatremia with sodium of 119.  She was admitted for hyponatremia.  She improved with IVF and discontinuing HCTZ.  She was stable on day of discharge.  Hospital Course:  1 severe hyponatremia Patient noted to have Bricia Taher severe hyponatremia at PCPs office with Datron Brakebill sodium of 119.  Repeat sodium in the ED with Lloyde Ludlam  sodium of 122.  Suspected secondary to hypovolemic hyponatremia as patient with Elizibeth Breau 3-week history of diarrhea off and on secondary to IBD, on Benicar HCTZ as well as Lasix.  Cortisol level drawn around 2:32 AM was 7.0, repeat cortisol 1/15 was ~11.6 making adrenal insufficiency unlikely (though could consider cort stim to r/o) Sodium levels improving with hydration.   Discontinue HCTZ and lasix at discharge, will need close follow up as outpatient   2. Gastroesophageal reflux disease Continue PPI.  3. Chronic kidney disease stage III Stable. Follow.  4. Complete heart block status post PPM Stable.  5. Atrial fibrillation on chronic anticoagulation/status post PPM V paced.   INR of 2.94.  Coumadin per pharmacy.  6. IBS Continue Xifaxan.  7. Urinary tract infection Urine cultures with multiple species.  Abx have been d/c'd.    8. Status post thyroidectomy TSH and free T4 within normal limits.  Continue home dose Synthroid.  9.  Anemia Likely dilutional.  Patient with no overt bleeding.  Check an anemia panel (AOCD).  Follow H&H.  Procedures:  none  Consultations:  none  Discharge Exam: Vitals:   02/21/18 2239 02/22/18 0637  BP: (!) 154/78 (!) 143/63  Pulse: 62 61  Resp: 20 17  Temp: 97.8 F (36.6 C) (!) 97.5 F (36.4 C)  SpO2: 100% 99%   NAD, feeling better. Daughter going to stay with her Ovella [place]goats while. Feels ready for discharge. Euvolemic. Discussed d/c plan with pt and duaghter at bedside.  General: No acute distress. Cardiovascular: Heart sounds show Damiyah Ditmars regular rate, and rhythm.  Lungs: Clear to auscultation  bilaterally  Abdomen: Soft, nontender, nondistended  Neurological: Alert and oriented 3. Moves all extremities 4. Cranial nerves II through XII grossly intact. Skin: Warm and dry. No rashes or lesions. Extremities: No clubbing or cyanosis. No edema  Discharge Instructions   Discharge Instructions    Call MD for:  difficulty  breathing, headache or visual disturbances   Complete by:  As directed    Call MD for:  extreme fatigue   Complete by:  As directed    Call MD for:  hives   Complete by:  As directed    Call MD for:  persistant dizziness or light-headedness   Complete by:  As directed    Call MD for:  persistant nausea and vomiting   Complete by:  As directed    Call MD for:  redness, tenderness, or signs of infection (pain, swelling, redness, odor or green/yellow discharge around incision site)   Complete by:  As directed    Call MD for:  severe uncontrolled pain   Complete by:  As directed    Call MD for:  temperature >100.4   Complete by:  As directed    Diet - low sodium heart healthy   Complete by:  As directed    Discharge instructions   Complete by:  As directed    You were seen for hyponatremia (low sodium levels).  We've stopped your diuretics for now.  Please stop your HCTZ going forward.  Hold your lasix for now until you follow up with your PCP for additional recommendations regarding your medications.  Your cortisol level did not suggest adrenal insufficiency, please follow this up as an outpatient to consider additional work up as recommended by your PCP.  Follow up with your PCP within the next day or so to repeat labs and follow up your INR level (warfarin monitoring).   Return for new, recurrent, or worsening symptoms.  Please ask your PCP to request records from this hospitalization so they know what was done and what the next steps will be.   Increase activity slowly   Complete by:  As directed      Allergies as of 02/22/2018   No Known Allergies     Medication List    STOP taking these medications   furosemide 40 MG tablet Commonly known as:  LASIX     TAKE these medications   clonazePAM 0.5 MG tablet Commonly known as:  KLONOPIN Take 1 tablet (0.5 mg total) by mouth 2 (two) times daily as needed for anxiety.   levothyroxine 88 MCG tablet Commonly known as:   SYNTHROID, LEVOTHROID TAKE 1 TABLET BY MOUTH ONCE DAILY What changed:  when to take this   olmesartan 20 MG tablet Commonly known as:  BENICAR Take 1 tablet (20 mg total) by mouth daily.   rifaximin 550 MG Tabs tablet Commonly known as:  XIFAXAN Take 1 tablet (550 mg total) by mouth 3 (three) times daily for 14 days.   triamcinolone 55 MCG/ACT Aero nasal inhaler Commonly known as:  NASACORT Place 2 sprays into the nose daily.   TYLENOL ARTHRITIS PAIN 650 MG CR tablet Generic drug:  acetaminophen Take 1,300 mg by mouth 2 (two) times daily.   warfarin 3 MG tablet Commonly known as:  COUMADIN Take as directed. If you are unsure how to take this medication, talk to your nurse or doctor. Original instructions:  TAKE AS DIRECTED BY COUMADIN CLINIC What changed:  See the new instructions.  No Known Allergies    The results of significant diagnostics from this hospitalization (including imaging, microbiology, ancillary and laboratory) are listed below for reference.    Significant Diagnostic Studies: Dg Chest 2 View  Result Date: 02/20/2018 CLINICAL DATA:  Weakness, irritable bowel symptoms for 1 week, history atrial fibrillation, breast cancer, GERD, hypertension EXAM: CHEST - 2 VIEW COMPARISON:  02/12/2018 FINDINGS: Left subclavian sequential transvenous pacemaker leads project at right atrium and right ventricle. Enlargement of cardiac silhouette. Atherosclerotic calcification aorta. Mediastinal contours and pulmonary vascularity normal. Emphysematous changes without infiltrate, pleural effusion, or pneumothorax. Post RIGHT mastectomy, RIGHT axillary node dissection and breast prosthesis. Bones demineralized. IMPRESSION: Enlargement of cardiac silhouette post pacemaker. COPD changes without infiltrate. Electronically Signed   By: Lavonia Dana M.D.   On: 02/20/2018 18:53   Dg Chest 2 View  Result Date: 02/12/2018 CLINICAL DATA:  Status post fall this morning. EXAM: CHEST - 2 VIEW  COMPARISON:  PA and lateral chest 11/11/2017 and 04/21/2017. FINDINGS: Pacing device in place. There is marked cardiomegaly. Lungs are clear. No pneumothorax or pleural effusion. No acute bony abnormality. Compression fracture at the thoracolumbar junction is chronic. No acute bony abnormality. Surgical clips right axilla noted. IMPRESSION: No acute disease. Cardiomegaly. Electronically Signed   By: Inge Rise M.D.   On: 02/12/2018 12:36   Dg Sacrum/coccyx  Result Date: 02/12/2018 CLINICAL DATA:  Coccygeal pain. Status post fall this morning. Initial encounter. EXAM: SACRUM AND COCCYX - 2+ VIEW COMPARISON:  CT abdomen and pelvis 08/28/2016. FINDINGS: There is no evidence of fracture or other focal bone lesions. Degenerative disease lower lumbar spine noted. IMPRESSION: No acute finding. Electronically Signed   By: Inge Rise M.D.   On: 02/12/2018 12:37   Ct Head Wo Contrast  Result Date: 02/12/2018 CLINICAL DATA:  Dizziness. Fall, striking the back of the head, with laceration. EXAM: CT HEAD WITHOUT CONTRAST TECHNIQUE: Contiguous axial images were obtained from the base of the skull through the vertex without intravenous contrast. COMPARISON:  10/27/2017 FINDINGS: Brain: The brainstem, cerebellum, cerebral peduncles, thalami, basal ganglia, basilar cisterns, and ventricular system appear within normal limits. No intracranial hemorrhage, mass lesion, or acute CVA. Vascular: There is atherosclerotic calcification of the cavernous carotid arteries bilaterally. Skull: Unremarkable Sinuses/Orbits: Mild chronic ethmoid sinusitis. Other: Left posterior occipitoparietal scalp soft tissue swelling. IMPRESSION: 1. No acute intracranial findings. 2. Left posterior occipitoparietal scalp soft tissue swelling. 3. Mild chronic ethmoid sinusitis. Electronically Signed   By: Van Clines M.D.   On: 02/12/2018 12:07    Microbiology: Recent Results (from the past 240 hour(s))  Urine culture     Status:  None   Collection Time: 02/20/18  4:32 PM  Result Value Ref Range Status   Specimen Description   Final    URINE, RANDOM Performed at [facility] [address], [place], [place] [address]    Special Requests   Final    NONE Performed at [facility], [place] [address] [place], [place] [address]    Culture   Final    Multiple bacterial morphotypes present, none predominant. Suggest appropriate recollection if clinically indicated.   Report Status 02/22/2018 FINAL  Final  MRSA PCR Screening     Status: None   Collection Time: 02/21/18  3:30 AM  Result Value Ref Range Status   MRSA by PCR NEGATIVE NEGATIVE Final    Comment:        The GeneXpert MRSA Assay (FDA approved for NASAL specimens only), is  one component of [facility] comprehensive MRSA colonization surveillance program. It is not intended to diagnose MRSA infection nor to guide or monitor treatment for MRSA infections. Performed at [facility], [address] [place], [place] [address]      Labs: Basic Metabolic Panel: Recent Labs  Lab 02/20/18 1339 02/20/18 1529 02/21/18 0232 02/21/18 1443 02/22/18 0600  NA 119* 122* 126* 129* 132*  K 4.2 4.3 3.9 4.2 4.4  CL 86* 88* 94* 97* 99  CO2 25 22 22 22  21*  GLUCOSE 104* 107* 89 95 96  BUN 20 21 20 17 19   CREATININE 1.12 1.13* 1.02* 0.90 0.94  CALCIUM 8.9 8.8* 8.2* 8.1* 8.0*  MG  --   --  1.7  --  2.2   Liver Function Tests: Recent Labs  Lab 02/20/18 1529 02/21/18 0232  AST 29 23  ALT 21 19  ALKPHOS 63 48  BILITOT 1.0 0.7  PROT 7.3 5.7*  ALBUMIN 4.7 3.5   No results for input(s): LIPASE, AMYLASE in the last 168 hours. No results for input(s): AMMONIA in the last 168 hours. CBC: Recent Labs  Lab 02/20/18 1529 02/21/18 0232 02/22/18 0600  WBC 5.1 4.7 4.2  NEUTROABS 3.2  --   --   HGB 12.7 10.9* 10.5*  HCT 37.8 32.4* 31.9*  MCV 91.3 89.5 93.8  PLT 259 220 193   Cardiac Enzymes: Recent Labs   Lab 02/21/18 0232  TROPONINI <0.03   BNP: BNP (last 3 results) No results for input(s): BNP in the last 8760 hours.  ProBNP (last 3 results) No results for input(s): PROBNP in the last 8760 hours.  CBG: Recent Labs  Lab 02/21/18 0849 02/22/18 0733  GLUCAP 104* 90       Signed:  Fayrene Helper MD.  [facility] 02/22/2018, 11:58 AM

## 2018-02-22 NOTE — Telephone Encounter (Signed)
I do not have samples. The only option is to wait for PA determination or to pay for a partial script out of pocket and then submit for reimbursement if the PA is approved.

## 2018-02-22 NOTE — Care Management Note (Signed)
Case Management Note  Patient Details  Name: [name] MRN: [id number] Date of Birth: 1924-02-10  Subjective/Objective:                  discharge  Action/Plan: Home with home health, RN, PT, Aide and sw/through [facility] List placed on shadow chart for agencies.  Expected Discharge Date:  02/22/18               Expected Discharge Plan:  [place]  In-House Referral:     Discharge planning Services  CM Consult  Post Acute Care Choice:  Home Health Choice offered to:  Patient  DME Arranged:  N/A DME Agency:     HH Arranged:  RN, PT, Nurse's Aide, Social Work [employer] Agency:  [facility]  Status of Service:  Completed, signed off  If discussed at [employer], dates discussed:    Additional Comments:  Leeroy Cha, RN 02/22/2018, 12:02 PM

## 2018-02-23 NOTE — Telephone Encounter (Signed)
PA started.  Key: [id number]

## 2018-02-23 NOTE — Telephone Encounter (Signed)
PA has been approved. My chart sent informing of same.

## 2018-02-24 ENCOUNTER — Telehealth: Payer: Self-pay | Admitting: *Deleted

## 2018-02-24 NOTE — Telephone Encounter (Signed)
Called pt to make hosp f/u appt. Pt had already had a previous appt schedule for 03/07/18. Will keep the same day, but change appt status to hosp follow-up. Also completed TCM call below.Johny Chess  Transition Care Management Follow-up Telephone Call   Date discharged? 02/22/18   How have you been since you were released from the hospital? Pt states she is doing fine..still a little weak, but that is to be expected since she has been in the hosp.    Do you understand why you were in the hospital? YES   Do you understand the discharge instructions? YES   Where were you discharged to? Home   Items Reviewed:  Medications reviewed: YES, no longer taking HCTZ or Lasix will need to discuss w/MD if she should start back taking  Allergies reviewed: YES  Dietary changes reviewed: YES  Referrals reviewed: No referral needed   Functional Questionnaire:   Activities of Daily Living (ADLs):   She states she are independent in the following: bathing and hygiene, feeding, continence, grooming, toileting and dressing States she require assistance with the following: ambulation   Any transportation issues/concerns?: NO   Any patient concerns? NO   Confirmed importance and date/time of follow-up visits scheduled YES, appt 03/07/18  Provider Appointment booked with Dr. [name]   Confirmed with patient if condition begins to worsen call PCP or go to the ER.  Patient was given the office number and encouraged to call back with question or concerns.  : YES

## 2018-02-25 DIAGNOSIS — D631 Anemia in chronic kidney disease: Secondary | ICD-10-CM | POA: Diagnosis not present

## 2018-02-25 DIAGNOSIS — M810 Age-related osteoporosis without current pathological fracture: Secondary | ICD-10-CM | POA: Diagnosis not present

## 2018-02-25 DIAGNOSIS — Z7901 Long term (current) use of anticoagulants: Secondary | ICD-10-CM | POA: Diagnosis not present

## 2018-02-25 DIAGNOSIS — I5030 Unspecified diastolic (congestive) heart failure: Secondary | ICD-10-CM | POA: Diagnosis not present

## 2018-02-25 DIAGNOSIS — K589 Irritable bowel syndrome without diarrhea: Secondary | ICD-10-CM | POA: Diagnosis not present

## 2018-02-25 DIAGNOSIS — K5790 Diverticulosis of intestine, part unspecified, without perforation or abscess without bleeding: Secondary | ICD-10-CM | POA: Diagnosis not present

## 2018-02-25 DIAGNOSIS — F411 Generalized anxiety disorder: Secondary | ICD-10-CM | POA: Diagnosis not present

## 2018-02-25 DIAGNOSIS — N183 Chronic kidney disease, stage 3 (moderate): Secondary | ICD-10-CM | POA: Diagnosis not present

## 2018-02-25 DIAGNOSIS — K219 Gastro-esophageal reflux disease without esophagitis: Secondary | ICD-10-CM | POA: Diagnosis not present

## 2018-02-25 DIAGNOSIS — I130 Hypertensive heart and chronic kidney disease with heart failure and stage 1 through stage 4 chronic kidney disease, or unspecified chronic kidney disease: Secondary | ICD-10-CM | POA: Diagnosis not present

## 2018-02-25 DIAGNOSIS — I4891 Unspecified atrial fibrillation: Secondary | ICD-10-CM | POA: Diagnosis not present

## 2018-02-25 DIAGNOSIS — E871 Hypo-osmolality and hyponatremia: Secondary | ICD-10-CM | POA: Diagnosis not present

## 2018-02-25 DIAGNOSIS — E039 Hypothyroidism, unspecified: Secondary | ICD-10-CM | POA: Diagnosis not present

## 2018-02-28 ENCOUNTER — Telehealth: Payer: Self-pay | Admitting: Internal Medicine

## 2018-02-28 ENCOUNTER — Telehealth: Payer: Self-pay | Admitting: Pharmacist

## 2018-02-28 NOTE — Telephone Encounter (Signed)
Called [facility] and gave verbal okay for PT orders as requested.

## 2018-02-28 NOTE — Telephone Encounter (Signed)
Patient called to say she was started on Clonazepam, olmesartan, rifaximin in hospital. Rifaximin can decrease INR. INR was trending down inpatient. Patient coumadin clinic appointment was rescheduled to come in earlier to check INR. Thus @ 1015

## 2018-02-28 NOTE — Telephone Encounter (Signed)
Copied from [place] [id number]. Topic: Quick Communication - Home Health Verbal Orders >> Feb 28, 2018  8:39 AM Bea Graff, NT wrote: Caller/Agency: [facility] Number: [phone] Requesting OT/PT/Skilled Nursing/Social Work: PT Frequency: 1 time a week for 1 week, 2 times a week for 2 weeks, and 1 time a week for 1 week effective 02/25/2018

## 2018-03-01 ENCOUNTER — Telehealth: Payer: Self-pay | Admitting: Internal Medicine

## 2018-03-01 NOTE — Telephone Encounter (Signed)
Spoke to [place] and gave verbal okay per PCP for verbals as requested.

## 2018-03-01 NOTE — Telephone Encounter (Signed)
Copied from [place]. Topic: Quick Communication - Home Health Verbal Orders >> Mar 01, 2018  8:45 AM Ivar Drape wrote: Caller/Agency:  Herma Mering Nurse w/[facility] Number:   [phone] Requesting OT/PT/Skilled Nursing/Social Work:  Skilled Nursing Frequency:   1w1, 2w1  Also an Aid for 2w2

## 2018-03-02 ENCOUNTER — Ambulatory Visit: Payer: Medicare Other

## 2018-03-02 DIAGNOSIS — Z5181 Encounter for therapeutic drug level monitoring: Secondary | ICD-10-CM

## 2018-03-02 DIAGNOSIS — I4891 Unspecified atrial fibrillation: Secondary | ICD-10-CM

## 2018-03-02 LAB — POCT INR: INR: 1.8 — AB (ref 2.0–3.0)

## 2018-03-02 NOTE — Patient Instructions (Signed)
Description   Take 1.5 tablets today, then start taking 1 tablet daily while on the antibiotic Rifaximin.  Recheck in 1 week. Call with any new or different medications [phone]

## 2018-03-06 ENCOUNTER — Ambulatory Visit (INDEPENDENT_AMBULATORY_CARE_PROVIDER_SITE_OTHER): Payer: Medicare Other

## 2018-03-06 DIAGNOSIS — I4891 Unspecified atrial fibrillation: Secondary | ICD-10-CM | POA: Diagnosis not present

## 2018-03-06 DIAGNOSIS — I442 Atrioventricular block, complete: Secondary | ICD-10-CM

## 2018-03-07 ENCOUNTER — Other Ambulatory Visit (INDEPENDENT_AMBULATORY_CARE_PROVIDER_SITE_OTHER): Payer: Medicare Other

## 2018-03-07 ENCOUNTER — Ambulatory Visit (INDEPENDENT_AMBULATORY_CARE_PROVIDER_SITE_OTHER)
Admission: RE | Admit: 2018-03-07 | Discharge: 2018-03-07 | Disposition: A | Discharge status: Home / Self Care | Payer: Medicare Other | Source: Ambulatory Visit | Attending: Internal Medicine | Admitting: Internal Medicine

## 2018-03-07 ENCOUNTER — Encounter: Payer: Self-pay | Admitting: Internal Medicine

## 2018-03-07 ENCOUNTER — Ambulatory Visit: Payer: Medicare Other | Admitting: Internal Medicine

## 2018-03-07 VITALS — BP 152/80 | HR 88 | Temp 97.8°F | Ht 66.5 in | Wt 131.8 lb

## 2018-03-07 DIAGNOSIS — N183 Chronic kidney disease, stage 3 unspecified: Secondary | ICD-10-CM

## 2018-03-07 DIAGNOSIS — I10 Essential (primary) hypertension: Secondary | ICD-10-CM

## 2018-03-07 DIAGNOSIS — D638 Anemia in other chronic diseases classified elsewhere: Secondary | ICD-10-CM

## 2018-03-07 DIAGNOSIS — S3992XA Unspecified injury of lower back, initial encounter: Secondary | ICD-10-CM | POA: Diagnosis not present

## 2018-03-07 DIAGNOSIS — E871 Hypo-osmolality and hyponatremia: Secondary | ICD-10-CM

## 2018-03-07 DIAGNOSIS — M533 Sacrococcygeal disorders, not elsewhere classified: Secondary | ICD-10-CM

## 2018-03-07 DIAGNOSIS — M545 Low back pain: Secondary | ICD-10-CM | POA: Diagnosis not present

## 2018-03-07 LAB — CBC WITH DIFFERENTIAL/PLATELET
Basophils Absolute: 0.1 10*3/uL (ref 0.0–0.1)
Basophils Relative: 1.1 % (ref 0.0–3.0)
Eosinophils Absolute: 0.1 10*3/uL (ref 0.0–0.7)
Eosinophils Relative: 2.7 % (ref 0.0–5.0)
HCT: 34.9 % — ABNORMAL LOW (ref 36.0–46.0)
Hemoglobin: 11.6 g/dL — ABNORMAL LOW (ref 12.0–15.0)
LYMPHS ABS: 1.3 10*3/uL (ref 0.7–4.0)
Lymphocytes Relative: 26.9 % (ref 12.0–46.0)
MCHC: 33.1 g/dL (ref 30.0–36.0)
MCV: 93.4 fl (ref 78.0–100.0)
Monocytes Absolute: 0.4 10*3/uL (ref 0.1–1.0)
Monocytes Relative: 8.6 % (ref 3.0–12.0)
NEUTROS ABS: 2.9 10*3/uL (ref 1.4–7.7)
NEUTROS PCT: 60.7 % (ref 43.0–77.0)
Platelets: 173 10*3/uL (ref 150.0–400.0)
RBC: 3.73 Mil/uL — ABNORMAL LOW (ref 3.87–5.11)
RDW: 13.2 % (ref 11.5–15.5)
WBC: 4.8 10*3/uL (ref 4.0–10.5)

## 2018-03-07 LAB — BASIC METABOLIC PANEL
BUN: 26 mg/dL — ABNORMAL HIGH (ref 6–23)
CO2: 27 meq/L (ref 19–32)
Calcium: 9 mg/dL (ref 8.4–10.5)
Chloride: 105 mEq/L (ref 96–112)
Creatinine, Ser: 1.25 mg/dL — ABNORMAL HIGH (ref 0.40–1.20)
GFR: 40.1 mL/min — ABNORMAL LOW (ref >60.00)
Glucose, Bld: 98 mg/dL (ref 70–99)
Potassium: 5.2 mEq/L — ABNORMAL HIGH (ref 3.5–5.1)
Sodium: 139 mEq/L (ref 135–145)

## 2018-03-07 LAB — CUP PACEART REMOTE DEVICE CHECK
Battery Impedance: 322 Ohm
Battery Remaining Longevity: 96 mo
Battery Voltage: 2.79 V
Brady Statistic RV Percent Paced: 99 %
Implantable Lead Implant Date: 20000921
Implantable Lead Implant Date: 20000921
Implantable Lead Location: 753859
Implantable Lead Location: 753860
Implantable Pulse Generator Implant Date: 20170118
Lead Channel Impedance Value: 527 Ohm
Lead Channel Impedance Value: 67 Ohm
Lead Channel Pacing Threshold Amplitude: 0.75 V
Lead Channel Pacing Threshold Pulse Width: 0.4 ms
Lead Channel Setting Pacing Amplitude: 2.5 V
Lead Channel Setting Pacing Pulse Width: 0.4 ms
Lead Channel Setting Sensing Sensitivity: 2 mV
MDC IDC SESS DTM: 20200127154409

## 2018-03-07 NOTE — Patient Instructions (Signed)
Tailbone Injury  The tailbone is the small bone at the lower end of the backbone (spine). The tailbone can become injured from:  · A fall.  · Sitting to row or bike for a long time.  · Having a baby.  This type of injury can be painful. Most tailbone injuries get better on their own in 4-6 weeks.  Follow these instructions at home:  Activity  · Avoid sitting in one place for a long time.  · Wear proper pads and gear when riding a bike or rowing.  · Increase your activity as the pain allows.  · Do exercises as told by your doctor or physical therapist.  Managing pain, stiffness, and swelling  · To lessen pain:  ? Sit on a large, rubber or inflated ring or cushion.  ? Lean forward when you sit.  · If told, apply ice to the injured area.  ? Put ice in a plastic bag.  ? Place a towel between your skin and the bag.  ? Leave the ice on for 20 minutes, 2-3 times per day. Do this for the first 1-2 days.  · If told, put heat on the injured area. Do this as often as told by your doctor. Use the heat source that your doctor recommends, such as a moist heat pack or a heating pad.  ? Place a towel between your skin and the heat source.  ? Leave the heat on for 20-30 minutes.  ? Remove the heat if your skin turns bright red. This is very important if you are unable to feel pain, heat, or cold. You may have a greater risk of getting burned.  General instructions  · Take over-the-counter and prescription medicines only as told by your doctor.  · To prevent or treat trouble pooping (constipation) or pain when pooping, your doctor may suggest that you:  ? Drink enough fluid to keep your pee (urine) pale yellow.  ? Eat foods that are high in fiber. These include fresh fruits and vegetables, whole grains, and beans.  ? Limit foods that are high in fat and sugar. These include fried and sweet foods.  ? Take an over-the-counter or prescription medicine to treat trouble pooping.  · Keep all follow-up visits as told by your doctor. This is  important.  Contact a doctor if:  · Your pain gets worse.  · Pooping causes you pain.  · You cannot poop after 4 days.  · You have pain during sex.  Summary  · A tailbone injury can happen from a fall, from sitting for a long time to row or bike, or after having a baby.  · These injuries can be painful. Most tailbone injuries get better on their own in 4-6 weeks.  · Sit on a large, rubber or inflated ring or cushion to lessen pain.  · Avoid sitting in one place for a long time.  · Follow your doctor's suggestions to prevent or treat trouble pooping.  This information is not intended to replace advice given to you by your health care provider. Make sure you discuss any questions you have with your health care provider.  Document Released: 02/27/2010 Document Revised: 02/22/2017 Document Reviewed: 02/22/2017  Elsevier Interactive Patient Education © 2019 [employer]

## 2018-03-07 NOTE — Progress Notes (Signed)
Remote pacemaker transmission.   

## 2018-03-07 NOTE — Progress Notes (Signed)
Subjective:  Patient ID: Alyssa [name], female    DOB: August 11, 1924  Age: 85 y.o. MRN: [id number]  CC: Back Pain and Anemia   HPI Alyssa [name] presents for hosp f/up - She was recently admitted for symptomatic hyponatremia.  She has stopped taking the diuretics and feels much better.  Her appetite is good and she is gaining weight.  She took a course of antibiotics for IBS with diarrhea and tells me that her bowel movements are normal now.  The weakness and dizziness has resolved.  She complains of tailbone pain for a month after a fall.  She is taking Tylenol for symptom relief.  Recommendations for Outpatient Follow-up:  1. Follow outpatient CBC/CMP 2. Stop HCTZ and lasix - follow Na as outpatient -- would not resume HCTZ, could consider resuming lasix based on volume status 3. Cortisol not suggestive of adrenal insufficiency at 11.6, could consider cort stim, if additional concern, but as improving not sure this is needed    Discharge Diagnoses:  Principal Problem:   Hyponatremia Active Problems:   Hypothyroidism   GERD   Irritable bowel syndrome   Pacemaker-dual chamber    Chronic renal insufficiency, stage III (moderate) (HCC)    atrial fibrillation   Complete heart block (HCC)   Essential hypertension, benign   GAD (generalized anxiety disorder)   (HFpEF) heart failure with preserved ejection fraction (HCC)   Seasonal allergic rhinitis due to pollen   IBS (irritable bowel syndrome)   Acute lower UTI   Anemia   Malnutrition of moderate degree    Outpatient Medications Prior to Visit  Medication Sig Dispense Refill  . acetaminophen (TYLENOL ARTHRITIS PAIN) 650 MG CR tablet Take 1,300 mg by mouth 2 (two) times daily.      . clonazePAM (KLONOPIN) 0.5 MG tablet Take 1 tablet (0.5 mg total) by mouth 2 (two) times daily as needed for anxiety. 180 tablet 0  . levothyroxine (SYNTHROID, LEVOTHROID) 88 MCG tablet TAKE 1 TABLET BY MOUTH ONCE DAILY (Patient taking differently:  Take 88 mcg by mouth daily before breakfast. ) 90 tablet 1  . olmesartan (BENICAR) 20 MG tablet Take 1 tablet (20 mg total) by mouth daily. 90 tablet 0  . triamcinolone (NASACORT) 55 MCG/ACT AERO nasal inhaler Place 2 sprays into the nose daily. 32.4 mL 1  . warfarin (COUMADIN) 3 MG tablet TAKE AS DIRECTED BY COUMADIN CLINIC (Patient taking differently: Take 1.5-3 mg by mouth See admin instructions. Take 1/2 tablet (1.5mg ) by mouth every Friday, all other days take 1 tablet (3mg ).) 90 tablet 0   No facility-administered medications prior to visit.     ROS Review of Systems  Constitutional: Negative for diaphoresis, fatigue and unexpected weight change.  HENT: Negative.   Eyes: Negative for visual disturbance.  Respiratory: Negative for cough, chest tightness, shortness of breath and wheezing.   Gastrointestinal: Negative for abdominal pain, diarrhea, nausea and vomiting.  Endocrine: Negative.   Genitourinary: Negative.  Negative for difficulty urinating and frequency.  Musculoskeletal: Positive for back pain.  Skin: Negative.  Negative for color change, pallor and rash.  Neurological: Negative.  Negative for dizziness, weakness, light-headedness and numbness.  Hematological: Negative for adenopathy. Does not bruise/bleed easily.  Psychiatric/Behavioral: Negative.     Objective:  BP (!) 152/80 (BP Location: Left Arm, Patient Position: Sitting, Cuff Size: Normal)   Pulse 88   Temp 97.8 F (36.6 C) (Oral)   Ht 5' 6.5" (1.689 m)   Wt 131 lb 12 oz (59.8  kg)   SpO2 98%   BMI 20.95 kg/m   BP Readings from Last 3 Encounters:  03/07/18 (!) 152/80  02/22/18 (!) 143/63  02/20/18 (!) 144/86    Wt Readings from Last 3 Encounters:  03/07/18 131 lb 12 oz (59.8 kg)  02/22/18 132 lb 14.4 oz (60.3 kg)  02/20/18 126 lb (57.2 kg)    Physical Exam Vitals signs reviewed.  Constitutional:      Appearance: She is not ill-appearing or diaphoretic.  HENT:     Nose: Nose normal. No  congestion or rhinorrhea.     Mouth/Throat:     Mouth: Mucous membranes are moist.     Pharynx: No posterior oropharyngeal erythema.  Eyes:     General: No scleral icterus.    Conjunctiva/sclera: Conjunctivae normal.  Neck:     Musculoskeletal: Normal range of motion and neck supple. No muscular tenderness.  Cardiovascular:     Rate and Rhythm: Normal rate and regular rhythm.     Heart sounds: S1 normal and S2 normal. No murmur. No friction rub. No gallop.   Pulmonary:     Effort: Pulmonary effort is normal.     Breath sounds: No stridor. No wheezing, rhonchi or rales.  Abdominal:     General: Abdomen is flat.     Palpations: There is no mass.     Tenderness: There is no abdominal tenderness. There is no guarding.     Hernia: No hernia is present.  Musculoskeletal: Normal range of motion.        General: No swelling.     Lumbar back: She exhibits normal range of motion, no tenderness, no bony tenderness, no swelling, no edema, no deformity and no pain.     Right lower leg: No edema.     Left lower leg: No edema.  Lymphadenopathy:     Cervical: No cervical adenopathy.  Skin:    General: Skin is warm and dry.     Coloration: Skin is not pale.     Findings: No erythema.  Neurological:     General: No focal deficit present.     Mental Status: She is oriented to person, place, and time. Mental status is at baseline.     Lab Results  Component Value Date   WBC 4.8 03/07/2018   HGB 11.6 (L) 03/07/2018   HCT 34.9 (L) 03/07/2018   PLT 173.0 03/07/2018   GLUCOSE 98 03/07/2018   CHOL 155 02/11/2014   TRIG 84.0 02/11/2014   HDL 46.10 02/11/2014   LDLCALC 92 02/11/2014   ALT 19 02/21/2018   AST 23 02/21/2018   NA 139 03/07/2018   K 5.2 (H) 03/07/2018   CL 105 03/07/2018   CREATININE 1.25 (H) 03/07/2018   BUN 26 (H) 03/07/2018   CO2 27 03/07/2018   TSH 0.748 02/21/2018   INR 1.8 (A) 03/02/2018   HGBA1C 6.0 02/20/2018    Dg Chest 2 View  Result Date:  02/20/2018 CLINICAL DATA:  Weakness, irritable bowel symptoms for 1 week, history atrial fibrillation, breast cancer, GERD, hypertension EXAM: CHEST - 2 VIEW COMPARISON:  02/12/2018 FINDINGS: Left subclavian sequential transvenous pacemaker leads project at right atrium and right ventricle. Enlargement of cardiac silhouette. Atherosclerotic calcification aorta. Mediastinal contours and pulmonary vascularity normal. Emphysematous changes without infiltrate, pleural effusion, or pneumothorax. Post RIGHT mastectomy, RIGHT axillary node dissection and breast prosthesis. Bones demineralized. IMPRESSION: Enlargement of cardiac silhouette post pacemaker. COPD changes without infiltrate. Electronically Signed   By: Crist Infante.D.  On: 02/20/2018 18:53    Assessment & Plan:   Brandilynn was seen today for back pain and anemia.  Diagnoses and all orders for this visit:  Chronic renal insufficiency, stage III (moderate) ([facility])- Her renal function is stable with a creatinine clearance of 27.7 mL/min.  She will avoid nephrotoxic agents. -     Basic metabolic panel; Future  Hyponatremia- This was caused by the diuretics.  Her sodium level is normal now.  For now I have asked her to hold on taking any diuretics unless she develops symptoms of fluid overload. -     Basic metabolic panel; Future  Coccydynia- Based on her symptoms, exam, normal x-ray this is a contusion.  She was asked to sit on a doughnut and to continue taking Tylenol as needed. -     DG Sacrum/Coccyx; Future  Anemia, chronic disease- Her H&H are stable.  This is likely caused by her renal disease. -     CBC with Differential/Platelet; Future  Essential hypertension, benign- Her blood pressure is adequately well controlled.   I am having Alyssa [name] maintain her acetaminophen, triamcinolone, levothyroxine, warfarin, clonazePAM, and olmesartan.  No orders of the defined types were placed in this encounter.    Follow-up: Return in about 3  months (around 06/06/2018).  [name], MD

## 2018-03-08 ENCOUNTER — Encounter: Payer: Self-pay | Admitting: Internal Medicine

## 2018-03-09 ENCOUNTER — Ambulatory Visit: Payer: Medicare Other | Admitting: Pharmacist

## 2018-03-09 DIAGNOSIS — Z5181 Encounter for therapeutic drug level monitoring: Secondary | ICD-10-CM | POA: Diagnosis not present

## 2018-03-09 DIAGNOSIS — I4891 Unspecified atrial fibrillation: Secondary | ICD-10-CM | POA: Diagnosis not present

## 2018-03-09 LAB — POCT INR: INR: 4.1 — AB (ref 2.0–3.0)

## 2018-03-09 NOTE — Patient Instructions (Signed)
Hold warfarin today and tomorrow then resume your previous dose of 1mg  daily except 1/2 on Fridays.  Recheck in 1 week. Resume eating greens about 3 times a week. Call with any new or different medications [phone]

## 2018-03-13 ENCOUNTER — Encounter: Payer: Medicare Other | Admitting: Internal Medicine

## 2018-03-20 ENCOUNTER — Ambulatory Visit: Payer: Medicare Other | Admitting: Internal Medicine

## 2018-03-20 ENCOUNTER — Encounter: Payer: Self-pay | Admitting: Internal Medicine

## 2018-03-20 ENCOUNTER — Ambulatory Visit: Payer: Self-pay | Admitting: *Deleted

## 2018-03-20 VITALS — BP 140/70 | HR 86 | Temp 98.3°F | Resp 16 | Ht 66.5 in | Wt 129.2 lb

## 2018-03-20 DIAGNOSIS — I10 Essential (primary) hypertension: Secondary | ICD-10-CM | POA: Diagnosis not present

## 2018-03-20 DIAGNOSIS — I5033 Acute on chronic diastolic (congestive) heart failure: Secondary | ICD-10-CM | POA: Diagnosis not present

## 2018-03-20 MED ORDER — TORSEMIDE 10 MG PO TABS: 10.0000 mg | ORAL_TABLET | Freq: Every day | ORAL | 1 refills | Status: DC

## 2018-03-20 NOTE — Patient Instructions (Signed)
Heart Failure °Heart failure is a condition in which the heart has trouble pumping blood because it has become weak or stiff. This means that the heart does not pump blood efficiently for the body to work well. For some people with heart failure, fluid may back up into the lungs and there may be swelling (edema) in the lower legs. Heart failure is usually a long-term (chronic) condition. It is important for you to take good care of yourself and follow the treatment plan from your health care provider. °What are the causes? °This condition is caused by some health problems, including: °· High blood pressure (hypertension). Hypertension causes the heart muscle to work harder than normal. High blood pressure eventually causes the heart to become stiff and weak. °· Coronary artery disease (CAD). CAD is the buildup of cholesterol and fat (plaques) in the arteries of the heart. °· Heart attack (myocardial infarction). Injured tissue, which is caused by the heart attack, does not contract as well and the heart's ability to pump blood is weakened. °· Abnormal heart valves. When the heart valves do not open and close properly, the heart muscle must pump harder to keep the blood flowing. °· Heart muscle disease (cardiomyopathy or myocarditis). Heart muscle disease is damage to the heart muscle from a variety of causes, such as drug or alcohol abuse, infections, or unknown causes. These can increase the risk of heart failure. °· Lung disease. When the lungs do not work properly, the heart must work harder. °What increases the risk? °Risk of heart failure increases as a person ages. This condition is also more likely to develop in people who: °· Are overweight. °· Are female. °· Smoke or chew tobacco. °· Abuse alcohol or illegal drugs. °· Have taken medicines that can damage the heart, such as chemotherapy drugs. °· Have diabetes. °? High blood sugar (glucose) is associated with high fat (lipid) levels in the blood. °? Diabetes  can also damage tiny blood vessels that carry nutrients to the heart muscle. °· Have abnormal heart rhythms. °· Have thyroid problems. °· Have low blood counts (anemia). °What are the signs or symptoms? °Symptoms of this condition include: °· Shortness of breath with activity, such as when climbing stairs. °· Persistent cough. °· Swelling of the feet, ankles, legs, or abdomen. °· Unexplained weight gain. °· Difficulty breathing when lying flat (orthopnea). °· Waking from sleep because of the need to sit up and get more air. °· Rapid heartbeat. °· Fatigue and loss of energy. °· Feeling light-headed, dizzy, or close to fainting. °· Loss of appetite. °· Nausea. °· Increased urination during the night (nocturia). °· Confusion. °How is this diagnosed? °This condition is diagnosed based on: °· Medical history, symptoms, and a physical exam. °· Diagnostic tests, which may include: °? Echocardiogram. °? Electrocardiogram (ECG). °? Chest X-ray. °? Blood tests. °? Exercise stress test. °? Radionuclide scans. °? Cardiac catheterization and angiogram. °How is this treated? °Treatment for this condition is aimed at managing the symptoms of heart failure. Medicines, behavioral changes, or other treatments may be necessary to treat heart failure. °Medicines °These may include: °· Angiotensin-converting enzyme (ACE) inhibitors. This type of medicine blocks the effects of a blood protein called angiotensin-converting enzyme. ACE inhibitors relax (dilate) the blood vessels and help to lower blood pressure. °· Angiotensin receptor blockers (ARBs). This type of medicine blocks the actions of a blood protein called angiotensin. ARBs dilate the blood vessels and help to lower blood pressure. °· Water pills (diuretics). Diuretics cause   the kidneys to remove salt and water from the blood. The extra fluid is removed through urination, leaving a lower volume of blood that the heart has to pump. °· Beta blockers. These improve heart muscle  strength and they prevent the heart from beating too quickly. °· Digoxin. This increases the force of the heartbeat. °Healthy behavior changes °These may include: °· Reaching and maintaining a healthy weight. °· Stopping smoking or chewing tobacco. °· Eating heart-healthy foods. °· Limiting or avoiding alcohol. °· Stopping use of street drugs (illegal drugs). °· Physical activity. °Other treatments °These may include: °· Surgery to open blocked coronary arteries or repair damaged heart valves. °· Placement of a biventricular pacemaker to improve heart muscle function (cardiac resynchronization therapy). This device paces both the right ventricle and left ventricle. °· Placement of a device to treat serious abnormal heart rhythms (implantable cardioverter defibrillator, or ICD). °· Placement of a device to improve the pumping ability of the heart (left ventricular assist device, or LVAD). °· Heart transplant. This can cure heart failure, and it is considered for certain patients who do not improve with other therapies. °Follow these instructions at home: °Medicines °· Take over-the-counter and prescription medicines only as told by your health care provider. Medicines are important in reducing the workload of your heart, slowing the progression of heart failure, and improving your symptoms. °? Do not stop taking your medicine unless your health care provider told you to do that. °? Do not skip any dose of medicine. °? Refill your prescriptions before you run out of medicine. You need your medicines every day. °Eating and drinking ° °· Eat heart-healthy foods. Talk with a dietitian to make an eating plan that is right for you. °? Choose foods that contain no trans fat and are low in saturated fat and cholesterol. Healthy choices include fresh or frozen fruits and vegetables, fish, lean meats, legumes, fat-free or low-fat dairy products, and whole-grain or high-fiber foods. °? Limit salt (sodium) if directed by your  health care provider. Sodium restriction may reduce symptoms of heart failure. Ask a dietitian to recommend heart-healthy seasonings. °? Use healthy cooking methods instead of frying. Healthy methods include roasting, grilling, broiling, baking, poaching, steaming, and stir-frying. °· Limit your fluid intake if directed by your health care provider. Fluid restriction may reduce symptoms of heart failure. °Lifestyle ° °· Stop smoking or using chewing tobacco. Nicotine and tobacco can damage your heart and your blood vessels. Do not use nicotine gum or patches before talking to your health care provider. °· Limit alcohol intake to no more than 1 drink per day for non-pregnant women and 2 drinks per day for men. One drink equals 12 oz of beer, 5 oz of wine, or 1½ oz of hard liquor. °? Drinking more than that is harmful to your heart. Tell your health care provider if you drink alcohol several times a week. °? Talk with your health care provider about whether any level of alcohol use is safe for you. °? If your heart has already been damaged by alcohol or you have severe heart failure, drinking alcohol should be stopped completely. °· Stop use of illegal drugs. °· Lose weight if directed by your health care provider. Weight loss may reduce symptoms of heart failure. °· Do moderate physical activity if directed by your health care provider. People who are elderly and people with severe heart failure should consult with a health care provider for physical activity recommendations. °Monitor important information ° °· Weigh   yourself every day. Keeping track of your weight daily helps you to notice excess fluid sooner. °? Weigh yourself every morning after you urinate and before you eat breakfast. °? Wear the same amount of clothing each time you weigh yourself. °? Record your daily weight. Provide your health care provider with your weight record. °· Monitor and record your blood pressure as told by your health care  provider. °· Check your pulse as told by your health care provider. °Dealing with extreme temperatures °· If the weather is extremely hot: °? Avoid vigorous physical activity. °? Use air conditioning or fans or seek a cooler location. °? Avoid caffeine and alcohol. °? Wear loose-fitting, lightweight, and light-colored clothing. °· If the weather is extremely cold: °? Avoid vigorous physical activity. °? Layer your clothes. °? Wear mittens or gloves, a hat, and a scarf when you go outside. °? Avoid alcohol. °General instructions °· Manage other health conditions such as hypertension, diabetes, thyroid disease, or abnormal heart rhythms as told by your health care provider. °· Learn to manage stress. If you need help to do this, ask your health care provider. °· Plan rest periods when fatigued. °· Get ongoing education and support as needed. °· Participate in or seek rehabilitation as needed to maintain or improve independence and quality of life. °· Stay up to date with immunizations. Keeping current on pneumococcal and influenza immunizations is especially important to prevent respiratory infections. °· Keep all follow-up visits as told by your health care provider. This is important. °Contact a health care provider if: °· You have a rapid weight gain. °· You have increasing shortness of breath that is unusual for you. °· You are unable to participate in your usual physical activities. °· You tire easily. °· You cough more than normal, especially with physical activity. °· You have any swelling or more swelling in areas such as your hands, feet, ankles, or abdomen. °· You are unable to sleep because it is hard to breathe. °· You feel like your heart is beating quickly (palpitations). °· You become dizzy or light-headed when you stand up. °Get help right away if: °· You have difficulty breathing. °· You notice or your family notices a change in your awareness, such as having trouble staying awake or having difficulty  with concentration. °· You have pain or discomfort in your chest. °· You have an episode of fainting (syncope). °This information is not intended to replace advice given to you by your health care provider. Make sure you discuss any questions you have with your health care provider. °Document Released: 01/25/2005 Document Revised: 12/24/2016 Document Reviewed: 08/20/2015 °[employer] Interactive Patient Education © 2019 [employer] ° °

## 2018-03-20 NOTE — Progress Notes (Signed)
Subjective:  Patient ID: Alyssa [name], female    DOB: 1924-11-30  Age: 85 y.o. MRN: [id number]  CC: Hypertension and Congestive Heart Failure   HPI Alyssa [name] presents for f/up - She complains of mild lower extremity edema since she stopped taking the diuretics.  She is taking the ARB and is tolerating it well.  Outpatient Medications Prior to Visit  Medication Sig Dispense Refill  . acetaminophen (TYLENOL ARTHRITIS PAIN) 650 MG CR tablet Take 1,300 mg by mouth 2 (two) times daily.      . clonazePAM (KLONOPIN) 0.5 MG tablet Take 1 tablet (0.5 mg total) by mouth 2 (two) times daily as needed for anxiety. 180 tablet 0  . levothyroxine (SYNTHROID, LEVOTHROID) 88 MCG tablet TAKE 1 TABLET BY MOUTH ONCE DAILY (Patient taking differently: Take 88 mcg by mouth daily before breakfast. ) 90 tablet 1  . olmesartan (BENICAR) 20 MG tablet Take 1 tablet (20 mg total) by mouth daily. 90 tablet 0  . triamcinolone (NASACORT) 55 MCG/ACT AERO nasal inhaler Place 2 sprays into the nose daily. 32.4 mL 1  . warfarin (COUMADIN) 3 MG tablet TAKE AS DIRECTED BY COUMADIN CLINIC (Patient taking differently: Take 1.5-3 mg by mouth See admin instructions. Take 1/2 tablet (1.5mg ) by mouth every Friday, all other days take 1 tablet (3mg ).) 90 tablet 0   No facility-administered medications prior to visit.     ROS Review of Systems  Constitutional: Negative for diaphoresis, fatigue and unexpected weight change.  HENT: Negative.   Respiratory: Negative.  Negative for cough, chest tightness, shortness of breath and wheezing.   Cardiovascular: Positive for leg swelling. Negative for chest pain and palpitations.  Gastrointestinal: Negative for abdominal pain, diarrhea, nausea and vomiting.  Genitourinary: Negative.  Negative for difficulty urinating and dysuria.  Musculoskeletal: Negative.  Negative for arthralgias and myalgias.  Skin: Negative.  Negative for color change.  Neurological: Negative.  Negative for  dizziness, weakness, light-headedness and headaches.  Hematological: Negative for adenopathy. Does not bruise/bleed easily.  Psychiatric/Behavioral: Negative.     Objective:  BP 140/70 (BP Location: Left Arm, Patient Position: Sitting, Cuff Size: Normal)   Pulse 86   Temp 98.3 F (36.8 C) (Oral)   Resp 16   Ht 5' 6.5" (1.689 m)   Wt 129 lb 4 oz (58.6 kg)   SpO2 96%   BMI 20.55 kg/m   BP Readings from Last 3 Encounters:  03/20/18 140/70  03/07/18 (!) 152/80  02/22/18 (!) 143/63    Wt Readings from Last 3 Encounters:  03/20/18 129 lb 4 oz (58.6 kg)  03/07/18 131 lb 12 oz (59.8 kg)  02/22/18 132 lb 14.4 oz (60.3 kg)    Physical Exam Vitals signs reviewed.  HENT:     Nose: Nose normal. No congestion or rhinorrhea.     Mouth/Throat:     Mouth: Mucous membranes are moist.     Pharynx: Oropharynx is clear. No oropharyngeal exudate or posterior oropharyngeal erythema.  Eyes:     General: No scleral icterus.    Conjunctiva/sclera: Conjunctivae normal.  Neck:     Musculoskeletal: Normal range of motion and neck supple.  Cardiovascular:     Rate and Rhythm: Normal rate and regular rhythm.     Heart sounds: Murmur present. Systolic murmur present with a grade of 1/6. No diastolic murmur. No gallop.   Pulmonary:     Effort: Pulmonary effort is normal. No respiratory distress.     Breath sounds: No stridor. No wheezing  or rales.  Abdominal:     General: Abdomen is flat.     Palpations: There is no hepatomegaly, splenomegaly or mass.     Tenderness: There is no abdominal tenderness.  Musculoskeletal:     Right lower leg: 1+ Edema present.     Left lower leg: 1+ Edema present.  Skin:    General: Skin is warm and dry.     Coloration: Skin is not pale.     Findings: No erythema or rash.  Neurological:     General: No focal deficit present.     Mental Status: She is oriented to person, place, and time. Mental status is at baseline.     Lab Results  Component Value Date     WBC 4.8 03/07/2018   HGB 11.6 (L) 03/07/2018   HCT 34.9 (L) 03/07/2018   PLT 173.0 03/07/2018   GLUCOSE 98 03/07/2018   CHOL 155 02/11/2014   TRIG 84.0 02/11/2014   HDL 46.10 02/11/2014   LDLCALC 92 02/11/2014   ALT 19 02/21/2018   AST 23 02/21/2018   NA 139 03/07/2018   K 5.2 (H) 03/07/2018   CL 105 03/07/2018   CREATININE 1.25 (H) 03/07/2018   BUN 26 (H) 03/07/2018   CO2 27 03/07/2018   TSH 0.748 02/21/2018   INR 4.1 (A) 03/09/2018   HGBA1C 6.0 02/20/2018    Dg Sacrum/coccyx  Result Date: 03/08/2018 CLINICAL DATA:  Fall.  Persistent pain. EXAM: SACRUM AND COCCYX - 2+ VIEW COMPARISON:  02/12/2018.  CT abdomen 08/29/2014. FINDINGS: Diffuse osteopenia. No evidence of acute fracture. No interim change from prior exam. Pelvic calcifications consistent phleboliths noted. Pelvic calcifications consistent with calcified fibroid again noted. IMPRESSION: Diffuse osteopenia. No evidence of acute fracture. No interim change from prior exam. Electronically Signed   By: Marcello Moores  Register   On: 03/08/2018 07:41    Assessment & Plan:   Alyssa [name] was seen today for hypertension and congestive heart failure.  Diagnoses and all orders for this visit:  Acute on chronic heart failure with preserved ejection fraction ([place])- She is starting to develop symptoms of fluid overload.  She was previously placed on Lasix and hydrochlorothiazide but developed severe symptomatic hyponatremia.  I think it would be best for her to try low-dose torsemide this time. -     torsemide (DEMADEX) 10 MG tablet; Take 1 tablet (10 mg total) by mouth daily.  Essential hypertension, benign -     torsemide (DEMADEX) 10 MG tablet; Take 1 tablet (10 mg total) by mouth daily.   I am having Alyssa [name] start on torsemide. I am also having her maintain her acetaminophen, triamcinolone, levothyroxine, warfarin, clonazePAM, and olmesartan.  Meds ordered this encounter  Medications  . torsemide (DEMADEX) 10 MG tablet    Sig:  Take 1 tablet (10 mg total) by mouth daily.    Dispense:  90 tablet    Refill:  1     Follow-up: Return in about 3 months (around 06/18/2018).  Alyssa Calico, MD

## 2018-03-20 NOTE — Telephone Encounter (Signed)
Been on 2 new medications for 29 days. Benicar 20 MG tab and clonazepam 0.5 MG tab. Symptoms now: Feeling sleepy and fatigued all day. Sometimes feels like she can't get a deep breathe in the mornings.  Some dizziness with stumbling that is worse in the mornings. She takes these 2 medications every morning.  B/P checks are around 136/68 HR 77 at last check. Also reports mild ankle edema since being taken off lasix. Office appointment made for 3:00p this afternoon.    Reason for Disposition . Caller has URGENT medication question about med that PCP prescribed and triager unable to answer question  Answer Assessment - Initial Assessment Questions 1. SYMPTOMS: "Do you have any symptoms?"     Fatigue, dizziness with stumbling at times. Sleepy all the time, now.  2. SEVERITY: If symptoms are present, ask "Are they mild, moderate or severe?"     Moderate  Protocols used: MEDICATION QUESTION CALL-A-AH

## 2018-03-20 NOTE — Telephone Encounter (Signed)
Pt seen today

## 2018-03-21 ENCOUNTER — Ambulatory Visit: Payer: Medicare Other

## 2018-03-21 DIAGNOSIS — I4891 Unspecified atrial fibrillation: Secondary | ICD-10-CM | POA: Diagnosis not present

## 2018-03-21 DIAGNOSIS — Z5181 Encounter for therapeutic drug level monitoring: Secondary | ICD-10-CM

## 2018-03-21 LAB — POCT INR: INR: 2.7 (ref 2.0–3.0)

## 2018-03-21 NOTE — Patient Instructions (Signed)
Please continue your previous dose of 1 tablet daily except 1/2 on Fridays.   Recheck in 3 weeks. Resume eating greens about 3 times a week. Call with any new or different medications [phone]

## 2018-03-22 ENCOUNTER — Telehealth: Payer: Self-pay | Admitting: Internal Medicine

## 2018-03-22 NOTE — Telephone Encounter (Signed)
Copied from [name] [phone]. Topic: Quick Communication - See Telephone Encounter >> Mar 22, 2018 10:37 AM Ivar Drape wrote: CRM for notification. See Telephone encounter for: 03/22/18. Patient stated when she went to pick up her torsemide (DEMADEX) 10 MG tablet medication it was to expensive.  She would like to know if there is something else she can take that is cheaper.  Please advise.

## 2018-03-23 NOTE — Telephone Encounter (Signed)
Key: [id number]

## 2018-03-24 NOTE — Telephone Encounter (Signed)
torsemide (DEMADEX) 10 MG tablet was not approved patient must try and fail at least one alternative (Bumetanide)   I can do an appeal [phone] if you would like.

## 2018-03-27 ENCOUNTER — Other Ambulatory Visit: Payer: Self-pay | Admitting: Internal Medicine

## 2018-03-27 DIAGNOSIS — I10 Essential (primary) hypertension: Secondary | ICD-10-CM

## 2018-03-27 DIAGNOSIS — I5033 Acute on chronic diastolic (congestive) heart failure: Secondary | ICD-10-CM

## 2018-03-27 MED ORDER — FUROSEMIDE 20 MG PO TABS: 20.0000 mg | ORAL_TABLET | Freq: Every day | ORAL | 1 refills | Status: DC

## 2018-03-27 NOTE — Telephone Encounter (Signed)
Change to lasix RX sent

## 2018-03-27 NOTE — Telephone Encounter (Signed)
Pt. Paid for torsemide out of pocket, bypassed insurance coverage---she will take that one until it runs out and then begin lasix---routing to dr [name], [facility] [place]ier.Marland KitchenMarland KitchenMarland Kitchen

## 2018-03-29 ENCOUNTER — Ambulatory Visit: Payer: Self-pay | Admitting: *Deleted

## 2018-03-29 NOTE — Telephone Encounter (Signed)
Patient phoned with complaint of very dry mouth and skin over the last several days. She began taking torsemide 10 MG daily 10 days ago. She cut the tab in half and only took one half tab(5 MG) today to see if the dryness would improve and reports "it has improved a lot". She denies any LE/ankle edema. Drinking >4 cups of water daily and voiding without difficulty several times today.  Reporting she has noticed having to breathe in more (faster) since she has started the torsemide-this occurs at times when she lies flat or on one pillow at night. She can use 2 pillows at night without any difficulty.  Wants to know if she can continue taking 1/2 tab torsemide instead of whole tablet? Routing to PCP for advice. Reason for Disposition . Caller has NON-URGENT medication question about med that PCP prescribed and triager unable to answer question  Answer Assessment - Initial Assessment Questions 1. SYMPTOMS: "Do you have any symptoms?"    Questions about torsemide 2. SEVERITY: If symptoms are present, ask "Are they mild, moderate or severe?"     Feeling very dry mouthed, dry skin.  Protocols used: MEDICATION QUESTION CALL-A-AH

## 2018-03-29 NOTE — Telephone Encounter (Signed)
   Answer Assessment - Initial Assessment Questions 1. SYMPTOMS: "Do you have any symptoms?"    Questions about torsemide 2. SEVERITY: If symptoms are present, ask "Are they mild, moderate or severe?"     Feeling very dry mouthed, dry skin.  Protocols used: MEDICATION QUESTION CALL-A-AH

## 2018-03-30 NOTE — Telephone Encounter (Signed)
TC to patient. Reviewed Dr.Jones note with her. She will take 1/2 tab torsemide daily. Discussed symptoms which she should CB if developed. Stated she understood.

## 2018-03-30 NOTE — Telephone Encounter (Signed)
Yes She can take 1/2 tab

## 2018-04-12 ENCOUNTER — Ambulatory Visit: Payer: Medicare Other | Admitting: *Deleted

## 2018-04-12 DIAGNOSIS — I4891 Unspecified atrial fibrillation: Secondary | ICD-10-CM

## 2018-04-12 DIAGNOSIS — Z5181 Encounter for therapeutic drug level monitoring: Secondary | ICD-10-CM | POA: Diagnosis not present

## 2018-04-12 LAB — POCT INR: INR: 2.7 (ref 2.0–3.0)

## 2018-04-12 NOTE — Patient Instructions (Signed)
Description   Continue taking 1 tablet daily except 1/2 on Fridays. Recheck in 4 weeks. Continue eating greens about 3 times a week. Call with any new or different medications [phone]

## 2018-05-06 ENCOUNTER — Other Ambulatory Visit: Payer: Self-pay | Admitting: Internal Medicine

## 2018-05-06 DIAGNOSIS — I10 Essential (primary) hypertension: Secondary | ICD-10-CM

## 2018-05-06 DIAGNOSIS — I5032 Chronic diastolic (congestive) heart failure: Secondary | ICD-10-CM

## 2018-05-15 ENCOUNTER — Telehealth: Payer: Self-pay | Admitting: *Deleted

## 2018-05-15 NOTE — Telephone Encounter (Signed)
1. Do you currently have a fever? no (yes = cancel and refer to pcp for e-visit) 2. Have you recently travelled on a cruise, internationally, or to [place], [place], [place], [place], [place], or [place], [place] ([employer]) ? no (yes = cancel, stay home, monitor symptoms, and contact pcp or initiate e-visit if symptoms develop) 3. Have you been in contact with someone that is currently pending confirmation of Covid19 testing or has been confirmed to have the Covid19 virus?  No (yes = cancel, stay home, away from tested individual, monitor symptoms, and contact pcp or initiate e-visit if symptoms develop) 4. Are you currently experiencing fatigue or cough?no(yes = pt should be prepared to have a mask placed at the time of their visit).  Pt. Advised that we are restricting visitors at this time and anyone present in the vehicle should meet the above criteria as well. Advised that visit will be at curbside for finger stick ONLY and will receive call with instructions. Pt also advised to please bring own pen for signature of arrival document.   

## 2018-05-17 ENCOUNTER — Telehealth: Payer: Self-pay

## 2018-05-17 NOTE — Telephone Encounter (Signed)

## 2018-05-18 ENCOUNTER — Ambulatory Visit (INDEPENDENT_AMBULATORY_CARE_PROVIDER_SITE_OTHER): Payer: Medicare Other | Admitting: *Deleted

## 2018-05-18 ENCOUNTER — Other Ambulatory Visit: Payer: Self-pay

## 2018-05-18 DIAGNOSIS — Z5181 Encounter for therapeutic drug level monitoring: Secondary | ICD-10-CM | POA: Diagnosis not present

## 2018-05-18 DIAGNOSIS — I4891 Unspecified atrial fibrillation: Secondary | ICD-10-CM | POA: Diagnosis not present

## 2018-05-18 LAB — POCT INR: INR: 2.9 (ref 2.0–3.0)

## 2018-05-18 NOTE — Patient Instructions (Addendum)
Description   Spoke with pt and instructed pt to continue taking 1 tablet daily except 1/2 on Fridays. Recheck in 6 weeks. Continue eating greens about 3 times a week. Call with any new or different medications [phone]

## 2018-05-18 NOTE — Telephone Encounter (Signed)
LVM for pt to call regarding her upcoming appt on 4/17

## 2018-05-26 ENCOUNTER — Other Ambulatory Visit: Payer: Self-pay

## 2018-05-26 ENCOUNTER — Telehealth (INDEPENDENT_AMBULATORY_CARE_PROVIDER_SITE_OTHER): Payer: Medicare Other | Admitting: Internal Medicine

## 2018-05-26 VITALS — BP 124/62 | HR 87

## 2018-05-26 DIAGNOSIS — I442 Atrioventricular block, complete: Secondary | ICD-10-CM

## 2018-05-26 DIAGNOSIS — R55 Syncope and collapse: Secondary | ICD-10-CM | POA: Diagnosis not present

## 2018-05-26 DIAGNOSIS — Z950 Presence of cardiac pacemaker: Secondary | ICD-10-CM

## 2018-05-26 DIAGNOSIS — D649 Anemia, unspecified: Secondary | ICD-10-CM

## 2018-05-26 DIAGNOSIS — I5030 Unspecified diastolic (congestive) heart failure: Secondary | ICD-10-CM

## 2018-05-26 DIAGNOSIS — I472 Ventricular tachycardia: Secondary | ICD-10-CM

## 2018-05-26 DIAGNOSIS — E871 Hypo-osmolality and hyponatremia: Secondary | ICD-10-CM

## 2018-05-26 DIAGNOSIS — Z5181 Encounter for therapeutic drug level monitoring: Secondary | ICD-10-CM

## 2018-05-26 DIAGNOSIS — Z952 Presence of prosthetic heart valve: Secondary | ICD-10-CM

## 2018-05-26 DIAGNOSIS — E875 Hyperkalemia: Secondary | ICD-10-CM

## 2018-05-26 DIAGNOSIS — I4891 Unspecified atrial fibrillation: Secondary | ICD-10-CM

## 2018-05-26 NOTE — Progress Notes (Signed)
Electrophysiology TeleHealth Note   Due to national recommendations of social distancing due to COVID 19, an audio/video telehealth visit is felt to be most appropriate for this patient at this time.  See MyChart message from today for the patient's consent to telehealth for [facility].   Date:  05/26/2018   ID:  Alyssa [name], DOB 05-Sep-1924, MRN [id number]  Location: patient's home  Provider location: [address], [place]  Evaluation Performed: Follow-up visit  PCP:  [name], MD  Cardiologist:     Electrophysiologist:  SK   Chief Complaint:  Atrial fib and pacemaker in place   History of Present Illness:    Alyssa [name] is a 85 y.o. female who presents via audio/video conferencing for a telehealth visit today.  Since last being seen in our clinic, the patient reports having been hospitalized 1/20.  Presented with diarrhea and syncope.   Persistent diarrhea, decreased p.o. intake and weakness; she was.  rehospitalized a few days later with profound hyponatremia 119.  Diuretics were discontinued.  At this point feels quite good.  No chest pain peripheral edema nocturnal dyspnea palpitations.  No syncope.  Minimal shortness of breath.  No clinical bleeding.  3/17 echocardiography>>Normal LV function; severe biatrial enlargement; mild RVE; severe  TR with moderately elevated pulmonary pressure; small pericardial  effusion.    Date Cr K Hgb  7/18 1.16  4.9 12.3   1.20 1.25 5.2 11.6     The patient denies symptoms of fevers, chills, cough, or new SOB worrisome for COVID 19.    Past Medical History:  Diagnosis Date  . Atrial fibrillation ([place])    pacemaker, chronic anticoag  . Breast cancer ([place])   . Diverticulosis   . Fibroma    inner lips/mouth  . GERD (gastroesophageal reflux disease)   . Hx of breast cancer   . Hyperkalemia   . Hypertension   . Hypothyroidism   . IBS (irritable bowel syndrome)   . Internal hemorrhoids   . Left  inguinal hernia    direct  . MVP (mitral valve prolapse)   . Osteoporosis   . Renal insufficiency   . Thyroid cancer [facility])     Past Surgical History:  Procedure Laterality Date  . COLONOSCOPY  10/28/2005   internal hemorrhoids, diverticulosis (same as in 2002 and random bxs negative then)  . EP IMPLANTABLE DEVICE N/A 02/26/2015   Procedure: PPM Generator Changeout;  Surgeon: [name], MD;  Location: [place] [facility];  Service: Cardiovascular;  Laterality: N/A;  . MASTECTOMY  1982   Bilateral  . PACEMAKER INSERTION    . THYROIDECTOMY    . TONSILLECTOMY    . UPPER GASTROINTESTINAL ENDOSCOPY  09/06/2000   normal    Current Outpatient Medications  Medication Sig Dispense Refill  . acetaminophen (TYLENOL ARTHRITIS PAIN) 650 MG CR tablet Take 1,300 mg by mouth 2 (two) times daily.      . clonazePAM (KLONOPIN) 0.5 MG tablet Take 1 tablet (0.5 mg total) by mouth 2 (two) times daily as needed for anxiety. 180 tablet 0  . levothyroxine (SYNTHROID, LEVOTHROID) 88 MCG tablet TAKE 1 TABLET BY MOUTH ONCE DAILY (Patient taking differently: Take 88 mcg by mouth daily before breakfast. ) 90 tablet 1  . olmesartan (BENICAR) 20 MG tablet Take 1 tablet by mouth once daily 90 tablet 0  . torsemide (DEMADEX) 10 MG tablet Take 1 tablet (10 mg total) by mouth daily. 90 tablet 1  .  warfarin (COUMADIN) 3 MG tablet TAKE AS DIRECTED BY MOUTH BY  COUMADIN  CLINIC 90 tablet 1  . triamcinolone (NASACORT) 55 MCG/ACT AERO nasal inhaler Place 2 sprays into the nose daily. (Patient not taking: Reported on 05/26/2018) 32.4 mL 1   No current facility-administered medications for this visit.     Allergies:   Patient has no known allergies.   Social History:  The patient  reports that she quit smoking about 64 years ago. She has never used smokeless tobacco. She reports that she does not drink alcohol or use drugs.   Family History:  The patient's   family history includes Arthritis in an other family member;  Colon cancer (age of onset: 79) in her father; Heart disease in her father; Hypertension in an other family member; Stroke in her mother.   ROS:  Please see the history of present illness.   All other systems are personally reviewed and negative.    Exam:    Vital Signs:  BP 124/62   Pulse 87  *   Well appearing, alert and conversant, regular work of breathing,  good skin color Eyes- anicteric, neuro- grossly intact, skin- no apparent rash or lesions or cyanosis, mouth- oral mucosa is pink   Labs/Other Tests and Data Reviewed:    Recent Labs: 02/21/2018: ALT 19; TSH 0.748 02/22/2018: Magnesium 2.2 03/07/2018: BUN 26; Creatinine, Ser 1.25; Hemoglobin 11.6; Platelets 173.0; Potassium 5.2; Sodium 139   Wt Readings from Last 3 Encounters:  03/20/18 129 lb 4 oz (58.6 kg)  03/07/18 131 lb 12 oz (59.8 kg)  02/22/18 132 lb 14.4 oz (60.3 kg)     Other studies personally reviewed: Additional studies/ records that were reviewed today include: As above, labs and hospital records      Last device remote is reviewed from [place] PDF dated 1/20* which reveals normal device function, no arrhythmias *VT ns   ASSESSMENT & PLAN:    Complete heart block  Atrial fibrillation   HFpEF    TR and RVE  Syncope  VT Nonsustained   Pacemaker-[employer]   Hyperkalemia  Hyponatremia  Anemia   Patient was hospitalized 1/20 with hyponatremia.  Thiazide diuretics were discontinued.  Reviewing the records, it seems that the hyponatremia emerged, not as simply medication effect, when on ongoing diuretics in the context of diarrhea and intravascular hypovolemia.  Thankfully, no interval edema  Potassium supplementation was discontinued.  Last potassium measurement was 5.2 on ARB therapy.  We will recheck it.  History of anemia.  Been progressive over the last few years but relatively stable over the last 2 years.  We will recheck her hemoglobin.  No clinical bleeding  Device function is  normal.  Intercurrent VT NS, when we check K will also check mg   Euvolemic continue current meds      COVID 19 screen The patient denies symptoms of COVID 19 at this time.  The importance of social distancing was discussed today.  Follow-up:  40 m Next remote: As Scheduled   Current medicines are reviewed at length with the patient today.   The patient does not have concerns regarding her medicines.  The following changes were made today:  none  Labs/ tests ordered today include:BMEt/Mg and CBC  Next week please No orders of the defined types were placed in this encounter.     Patient Risk:  after full review of this patients clinical status, I feel that they are at moderate risk at this time.  Today,  I have spent 14 minutes with the patient with telehealth technology discussing the above.  Signed, Virl Axe, MD  05/26/2018 2:50 PM     [address] [place] [address] [phone] (office) [phone] (fax)

## 2018-05-26 NOTE — Addendum Note (Signed)
Addended by: Dollene Primrose on: 05/26/2018 03:26 PM   Modules accepted: Orders

## 2018-05-27 ENCOUNTER — Other Ambulatory Visit: Payer: Self-pay | Admitting: Internal Medicine

## 2018-05-27 DIAGNOSIS — E038 Other specified hypothyroidism: Secondary | ICD-10-CM

## 2018-05-30 ENCOUNTER — Other Ambulatory Visit: Payer: Self-pay

## 2018-05-30 ENCOUNTER — Other Ambulatory Visit: Payer: Medicare Other

## 2018-05-30 ENCOUNTER — Telehealth: Payer: Self-pay | Admitting: Internal Medicine

## 2018-05-30 DIAGNOSIS — Z5181 Encounter for therapeutic drug level monitoring: Secondary | ICD-10-CM

## 2018-05-30 DIAGNOSIS — I4891 Unspecified atrial fibrillation: Secondary | ICD-10-CM

## 2018-05-30 DIAGNOSIS — Z950 Presence of cardiac pacemaker: Secondary | ICD-10-CM | POA: Diagnosis not present

## 2018-05-30 DIAGNOSIS — I442 Atrioventricular block, complete: Secondary | ICD-10-CM

## 2018-05-30 NOTE — Telephone Encounter (Signed)
Pt called asking when she should come in to do her bloodwork

## 2018-05-30 NOTE — Telephone Encounter (Signed)
Pt will come in for labs today around 2pm

## 2018-05-31 LAB — BASIC METABOLIC PANEL
BUN/Creatinine Ratio: 32 — ABNORMAL HIGH (ref 12–28)
BUN: 39 mg/dL — ABNORMAL HIGH (ref 10–36)
CO2: 25 mmol/L (ref 20–29)
Calcium: 9.1 mg/dL (ref 8.7–10.3)
Chloride: 102 mmol/L (ref 96–106)
Creatinine, Ser: 1.22 mg/dL — ABNORMAL HIGH (ref 0.57–1.00)
GFR calc Af Amer: 44 mL/min/{1.73_m2} — ABNORMAL LOW (ref >59)
GFR calc non Af Amer: 39 mL/min/{1.73_m2} — ABNORMAL LOW (ref >59)
Glucose: 100 mg/dL — ABNORMAL HIGH (ref 65–99)
Potassium: 4.3 mmol/L (ref 3.5–5.2)
Sodium: 142 mmol/L (ref 134–144)

## 2018-05-31 LAB — MAGNESIUM: Magnesium: 2 mg/dL (ref 1.6–2.3)

## 2018-05-31 LAB — CBC
Hematocrit: 36.2 % (ref 34.0–46.6)
Hemoglobin: 12.2 g/dL (ref 11.1–15.9)
MCH: 30.4 pg (ref 26.6–33.0)
MCHC: 33.7 g/dL (ref 31.5–35.7)
MCV: 90 fL (ref 79–97)
Platelets: 193 10*3/uL (ref 150–450)
RBC: 4.01 x10E6/uL (ref 3.77–5.28)
RDW: 12.1 % (ref 11.7–15.4)
WBC: 4.4 10*3/uL (ref 3.4–10.8)

## 2018-06-05 ENCOUNTER — Other Ambulatory Visit: Payer: Self-pay

## 2018-06-05 ENCOUNTER — Encounter: Payer: Medicare Other | Admitting: *Deleted

## 2018-06-05 ENCOUNTER — Telehealth: Payer: Self-pay

## 2018-06-05 NOTE — Telephone Encounter (Signed)
Pt tried to send a transmission but was unsuccessful. Pt tried to call [employer] tech support but the wait time was very long. I put a request into [employer] Tech support to contact the patient to help troubleshoot the monitor or send her a replacement monitor

## 2018-06-06 ENCOUNTER — Telehealth: Payer: Self-pay | Admitting: Internal Medicine

## 2018-06-06 ENCOUNTER — Telehealth: Payer: Self-pay

## 2018-06-06 NOTE — Telephone Encounter (Signed)
Spoke with patient to remind of missed remote transmission 

## 2018-06-06 NOTE — Telephone Encounter (Signed)
Pt c/o Shortness Of Breath: STAT if SOB developed within the last 24 hours or pt is noticeably SOB on the phone  1. Are you currently SOB (can you hear that pt is SOB on the phone)? NO  2. How long have you been experiencing SOB? A few weeks, on and off  3. Are you SOB when sitting or when up moving around? Both, sometimes a night, she sleeps with two pillows.   4. Are you currently experiencing any other symptoms? She said she takes a water pill, and she was told to take half a pill she is thinking it not enough.  She thinks she might need to take the whole pill.

## 2018-06-06 NOTE — Telephone Encounter (Signed)
Spoke with pt and feels "breathless" x 2 weeks  and forgot to mention this to Dr Caryl Comes and after speaking with pt. Pt had been only taking 1/2 tab  5 mg  of Torsemide for some time and Dr Olin Pia last note indicate pt taking 10 mg Pt going to resume 10 mg Pt's last BUN was 39 and Cr was 1.22 labs dated 4/21 and this was while taking 5 mg of Torsemide .Will forward to Dr Caryl Comes for review .Adonis Housekeeper

## 2018-06-07 ENCOUNTER — Telehealth: Payer: Self-pay

## 2018-06-07 NOTE — Telephone Encounter (Signed)
I called to follow up with the pt to see if [employer] called to help her trouble shoot her monitor. The pt states [employer] has not reached out to her yet.

## 2018-06-08 NOTE — Telephone Encounter (Signed)
Agree   Can we followup with patient next week to see how she is doing

## 2018-06-08 NOTE — Telephone Encounter (Signed)
Pt spoke with [employer]. [employer] advised the pt to come to the office and have someone help her with her monitor. I told her I will let the nurse know and someone will call her to set that appointment up. I told if not today, tomorrow someone will give her a call. The best number for the pt is [phone].

## 2018-06-09 ENCOUNTER — Telehealth: Payer: Self-pay | Admitting: Internal Medicine

## 2018-06-09 NOTE — Telephone Encounter (Signed)
See previous encounter; pt scheduled for device clinic check.

## 2018-06-09 NOTE — Telephone Encounter (Signed)
Spoke with pt today and she states she is feeling "much better" now that she has increase her dose. She states she will continue on her torsemide 10mg  qd. If she begins to feel SOB again, she will let us know.

## 2018-06-09 NOTE — Telephone Encounter (Signed)
Noted  

## 2018-06-09 NOTE — Telephone Encounter (Signed)
Spoke with pt and scheduled her for an in office device check on May 18th as this was first available. Pt will bring in her remote device to help troubleshoot as well.

## 2018-06-09 NOTE — Telephone Encounter (Signed)
New message:    Patient calling to speak with nurse. Please call patient. Patient states she to bring in her monitor for a appt.

## 2018-06-12 ENCOUNTER — Telehealth: Payer: Self-pay | Admitting: Internal Medicine

## 2018-06-12 NOTE — Telephone Encounter (Signed)
New Message    Pt c/o Shortness Of Breath: STAT if SOB developed within the last 24 hours or pt is noticeably SOB on the phone  1. Are you currently SOB (can you hear that pt is SOB on the phone)? Yes, comes and goes. Cannot really tell over the phone.   2. How long have you been experiencing SOB? A couple of weeks   3. Are you SOB when sitting or when up moving around? Mainly when sitting and she says it is hard for her to breath a lot   4. Are you currently experiencing any other symptoms? Pt says she feels fine, no fever, BP is okay. She thinks her SOB has something to do with a medication she is taking

## 2018-06-12 NOTE — Telephone Encounter (Signed)
Pt called to report that she is still having SOB but it is worsening and she is noticing it with rest and minimal exertion... she denies chest pain, dizziness, and plapitations..no peripheral edema and  unaware of her VS.. she has been taking Torsemide 10mg  a day but this morning she took a leftover 20mg  that she had at home... she denies fever, cough.   She is anxious about it and asking for Dr. Olin Pia recommendation. Will forward to him for review.

## 2018-06-13 NOTE — Telephone Encounter (Signed)
Spoke with pt. She will call the office on Friday to let us know how she is feeling. If the lasix is working better for her, we can add this back to her medication list and take the torsemide off for future refills.

## 2018-06-13 NOTE — Telephone Encounter (Signed)
Will take furosemide 40 mg qam x 2 days then 40 mg every other day   Will have her call back late this or early next week for an  Update on breathing status

## 2018-06-19 ENCOUNTER — Telehealth: Payer: Self-pay

## 2018-06-19 NOTE — Telephone Encounter (Signed)
The pt states that [employer] is sending her a new monitor. The pt wanted to cancel her appointment for the 06/26/2018. I advised her to wait to see if she can send the transmission before cancelling the appointment. The pt states if she is getting the new device she thinks the transmission will come thru and wants to cancel the appointment.

## 2018-06-22 ENCOUNTER — Other Ambulatory Visit: Payer: Self-pay

## 2018-06-22 ENCOUNTER — Ambulatory Visit (INDEPENDENT_AMBULATORY_CARE_PROVIDER_SITE_OTHER): Payer: Medicare Other | Admitting: *Deleted

## 2018-06-22 DIAGNOSIS — I442 Atrioventricular block, complete: Secondary | ICD-10-CM

## 2018-06-22 LAB — CUP PACEART REMOTE DEVICE CHECK
Battery Impedance: 371 Ohm
Battery Remaining Longevity: 93 mo
Battery Voltage: 2.79 V
Brady Statistic RV Percent Paced: 99 %
Date Time Interrogation Session: 20200513170610
Implantable Lead Implant Date: 20000921
Implantable Lead Implant Date: 20000921
Implantable Lead Location: 753859
Implantable Lead Location: 753860
Implantable Pulse Generator Implant Date: 20170118
Lead Channel Impedance Value: 582 Ohm
Lead Channel Impedance Value: 67 Ohm
Lead Channel Pacing Threshold Amplitude: 0.75 V
Lead Channel Pacing Threshold Pulse Width: 0.4 ms
Lead Channel Setting Pacing Amplitude: 2.5 V
Lead Channel Setting Pacing Pulse Width: 0.4 ms
Lead Channel Setting Sensing Sensitivity: 2 mV

## 2018-06-23 ENCOUNTER — Telehealth: Payer: Self-pay | Admitting: Internal Medicine

## 2018-06-23 NOTE — Telephone Encounter (Signed)
LMTCB

## 2018-06-23 NOTE — Telephone Encounter (Signed)
New Message            Patient is calling to let Dr. Caryl Comes know that the medication that was changed is doing good. Just a [place]

## 2018-06-26 MED ORDER — FUROSEMIDE 40 MG PO TABS: 40.0000 mg | ORAL_TABLET | ORAL | 3 refills | Status: DC

## 2018-06-26 NOTE — Telephone Encounter (Signed)
Spoke with pt who states she is doing well on her new medication regimen. She is taking 40mg  of lasix every Mon, Wed, and Friday. She states her SOB is much better. I have updated her medication list as torsemide was listed as her diuretic. This was recently changed by Dr Caryl Comes for medication cost.

## 2018-06-28 ENCOUNTER — Telehealth: Payer: Self-pay

## 2018-06-28 ENCOUNTER — Encounter: Payer: Self-pay | Admitting: Cardiology

## 2018-06-28 NOTE — Telephone Encounter (Signed)
lmom for prescreen  

## 2018-06-28 NOTE — Progress Notes (Signed)
Remote pacemaker transmission.   

## 2018-06-29 ENCOUNTER — Ambulatory Visit (INDEPENDENT_AMBULATORY_CARE_PROVIDER_SITE_OTHER): Payer: Medicare Other | Admitting: Pharmacist

## 2018-06-29 ENCOUNTER — Telehealth: Payer: Self-pay | Admitting: *Deleted

## 2018-06-29 ENCOUNTER — Other Ambulatory Visit: Payer: Self-pay

## 2018-06-29 DIAGNOSIS — I4891 Unspecified atrial fibrillation: Secondary | ICD-10-CM | POA: Diagnosis not present

## 2018-06-29 DIAGNOSIS — Z5181 Encounter for therapeutic drug level monitoring: Secondary | ICD-10-CM | POA: Diagnosis not present

## 2018-06-29 LAB — POCT INR: INR: 2 (ref 2.0–3.0)

## 2018-06-29 NOTE — Telephone Encounter (Signed)

## 2018-08-03 ENCOUNTER — Telehealth: Payer: Self-pay

## 2018-08-03 NOTE — Telephone Encounter (Signed)

## 2018-08-05 ENCOUNTER — Other Ambulatory Visit: Payer: Self-pay | Admitting: Internal Medicine

## 2018-08-05 DIAGNOSIS — I5032 Chronic diastolic (congestive) heart failure: Secondary | ICD-10-CM

## 2018-08-05 DIAGNOSIS — F411 Generalized anxiety disorder: Secondary | ICD-10-CM

## 2018-08-05 DIAGNOSIS — I10 Essential (primary) hypertension: Secondary | ICD-10-CM

## 2018-08-07 ENCOUNTER — Other Ambulatory Visit: Payer: Self-pay | Admitting: Internal Medicine

## 2018-08-07 ENCOUNTER — Telehealth: Payer: Self-pay | Admitting: Internal Medicine

## 2018-08-07 DIAGNOSIS — F411 Generalized anxiety disorder: Secondary | ICD-10-CM

## 2018-08-07 DIAGNOSIS — E876 Hypokalemia: Secondary | ICD-10-CM

## 2018-08-07 NOTE — Telephone Encounter (Signed)
Per 05/24/17 [facility] notes pt is in permanent AF and at this time programmed VVIR. Remote transmission received today. Pt is AF/VP at 79.

## 2018-08-07 NOTE — Telephone Encounter (Signed)
Spoke with pt and discussed her manual transmission and stable HR. I advised her to take her lasix qd x 3 days. I will call her on Thursday to see if she has improved.   She verbalized understanding and had no additional questions.

## 2018-08-07 NOTE — Telephone Encounter (Signed)
Pt c/o Shortness Of Breath: STAT if SOB developed within the last 24 hours or pt is noticeably SOB on the phone  1. Are you currently SOB (can you hear that pt is SOB on the phone)? 150/85 patient is out breath   2. How long have you been experiencing SOB? Since new  medication since 06/13/18  3. Are you SOB when sitting or when up moving around? At night and some doing the day.  4. Are you currently experiencing any other symptoms? No

## 2018-08-07 NOTE — Telephone Encounter (Signed)
No ventricular high rate episodes noted since 03/08/18. Histograms appropriate.

## 2018-08-07 NOTE — Telephone Encounter (Signed)
Spoke with pt. She states she has been SOB for the last few days making it hard to sleep. She takes lasix Mon, Wed, Fri, but took an extra dose (40mg ) this weekend which improved her sx. However, today she is audibly SOB. Given her hx of AF with some episodes of AFRVR, I have instructed her to send in a manual transmission to assess her avg HR over the past few days. Pt understands I will contact her back when the device team has evaluated.

## 2018-08-10 ENCOUNTER — Other Ambulatory Visit: Payer: Self-pay

## 2018-08-10 ENCOUNTER — Ambulatory Visit: Payer: Medicare Other | Admitting: *Deleted

## 2018-08-10 ENCOUNTER — Other Ambulatory Visit: Payer: Medicare Other | Admitting: *Deleted

## 2018-08-10 DIAGNOSIS — I4891 Unspecified atrial fibrillation: Secondary | ICD-10-CM

## 2018-08-10 DIAGNOSIS — Z5181 Encounter for therapeutic drug level monitoring: Secondary | ICD-10-CM | POA: Diagnosis not present

## 2018-08-10 DIAGNOSIS — E876 Hypokalemia: Secondary | ICD-10-CM

## 2018-08-10 LAB — POCT INR: INR: 1.8 — AB (ref 2.0–3.0)

## 2018-08-10 MED ORDER — POTASSIUM CHLORIDE CRYS ER 20 MEQ PO TBCR: 20.0000 meq | EXTENDED_RELEASE_TABLET | Freq: Every day | ORAL | 3 refills | Status: DC

## 2018-08-10 MED ORDER — FUROSEMIDE 40 MG PO TABS: 40.0000 mg | ORAL_TABLET | Freq: Every day | ORAL | 3 refills | Status: DC

## 2018-08-10 NOTE — Patient Instructions (Signed)
Description   Today take 1.5 tablets then continue taking 1 tablet daily except 1/2 on Fridays. Recheck in 5 weeks. Continue eating greens about 3 times a week. Call with any new or different medications [phone]

## 2018-08-10 NOTE — Telephone Encounter (Signed)
Lorren I agree-- daily lasix and K and BMET next week  Thx SK

## 2018-08-10 NOTE — Telephone Encounter (Signed)
Called pt to check up on her. She states yesterday and last night was the first time she did not feel SOB since taking her lasix qd since Friday June 25. Today she plans on taking it again since she does not want to become SOB through the course of the day. She wonders if she should be taking it every day or needing a higher dose. She states yesterday she had some mild cramping in her legs. She is not taking K+ regularly.  I advised pt I would discuss with Dr. Caryl Comes today. I advised her to take her K+ (39mEq) today with her lasix and it is possible she may need a BMP next week to be sure her K+ is WNL. She will await my return call this afternoon.

## 2018-08-10 NOTE — Addendum Note (Signed)
Addended by: Dollene Primrose on: 08/10/2018 12:05 PM   Modules accepted: Orders

## 2018-08-10 NOTE — Telephone Encounter (Signed)
Spoke with pt and gave her Dr. Olin Pia recommendations. She will begin Lasix 40mg , qd and K+ 60mEq qd on the days she takes her lasix. She will have a BMP drawn today when she arrives for her coumadin check. She understands we will call her back with any recommendations after it has resulted.   She has no additional questions.

## 2018-08-11 LAB — BASIC METABOLIC PANEL
BUN/Creatinine Ratio: 25 (ref 12–28)
BUN: 35 mg/dL (ref 10–36)
CO2: 23 mmol/L (ref 20–29)
Calcium: 8.9 mg/dL (ref 8.7–10.3)
Chloride: 103 mmol/L (ref 96–106)
Creatinine, Ser: 1.42 mg/dL — ABNORMAL HIGH (ref 0.57–1.00)
GFR calc Af Amer: 37 mL/min/{1.73_m2} — ABNORMAL LOW (ref >59)
GFR calc non Af Amer: 32 mL/min/{1.73_m2} — ABNORMAL LOW (ref >59)
Glucose: 97 mg/dL (ref 65–99)
Potassium: 4.6 mmol/L (ref 3.5–5.2)
Sodium: 142 mmol/L (ref 134–144)

## 2018-08-21 ENCOUNTER — Telehealth: Payer: Self-pay | Admitting: Internal Medicine

## 2018-08-21 NOTE — Telephone Encounter (Signed)
° °  Pt c/o medication issue:  1. Name of Medication: furosemide (LASIX) 40 MG tablet  2. How are you currently taking this medication (dosage and times per day)? As directed, 40 mg 1x daily  3. Are you having a reaction (difficulty breathing--STAT)? SOB /difficulty breathing  4. What is your medication issue? Changes in breathing since increasing dosage     Pt c/o Shortness Of Breath: STAT if SOB developed within the last 24 hours or pt is noticeably SOB on the phone  1. Are you currently SOB (can you hear that pt is SOB on the phone)? no  2. How long have you been experiencing SOB? Since starting new medication Sunday  3. Are you SOB when sitting or when up moving around? All the time  4. Are you currently experiencing any other symptoms? Labored breathing, hard to get a breath.

## 2018-08-21 NOTE — Telephone Encounter (Signed)
I called the patient regarding SOB and she requested to speak to Lorren about her symptoms and medication.

## 2018-08-21 NOTE — Telephone Encounter (Signed)
Spoke with pt who has an appt with her PCP tomorrow. She states she is SOB for periods lasting up to a few hours. Her HR remains in the 70's, BP's in the 130's-140's. She is compliant on her 40mg  lasix qd. She states she finds relief from taking her lasix most of the time, but continues to have periods of SOB.   She is concerned her sx could be related to COVID. She denies a fever, ache, pain, loss of sense of smell or taste. I advised her since her sx have been ongoing and without any additional COVID symptoms, I did not believe she should worry and would be okay for her appt tomorrow.   She verbalized understanding and had no additional questions.

## 2018-08-22 ENCOUNTER — Other Ambulatory Visit: Payer: Self-pay

## 2018-08-22 ENCOUNTER — Other Ambulatory Visit (INDEPENDENT_AMBULATORY_CARE_PROVIDER_SITE_OTHER): Payer: Medicare Other

## 2018-08-22 ENCOUNTER — Encounter: Payer: Self-pay | Admitting: Internal Medicine

## 2018-08-22 ENCOUNTER — Ambulatory Visit (INDEPENDENT_AMBULATORY_CARE_PROVIDER_SITE_OTHER): Payer: Medicare Other | Admitting: Internal Medicine

## 2018-08-22 VITALS — BP 128/66 | HR 82 | Temp 98.2°F | Resp 16 | Ht 66.5 in | Wt 123.8 lb

## 2018-08-22 DIAGNOSIS — E039 Hypothyroidism, unspecified: Secondary | ICD-10-CM

## 2018-08-22 DIAGNOSIS — J301 Allergic rhinitis due to pollen: Secondary | ICD-10-CM

## 2018-08-22 DIAGNOSIS — I10 Essential (primary) hypertension: Secondary | ICD-10-CM

## 2018-08-22 DIAGNOSIS — M5432 Sciatica, left side: Secondary | ICD-10-CM | POA: Diagnosis not present

## 2018-08-22 LAB — TSH: TSH: 0.68 u[IU]/mL (ref 0.35–4.50)

## 2018-08-22 MED ORDER — LEVOCETIRIZINE DIHYDROCHLORIDE 5 MG PO TABS: 5.0000 mg | ORAL_TABLET | Freq: Every evening | ORAL | 1 refills | Status: DC

## 2018-08-22 MED ORDER — MONTELUKAST SODIUM 10 MG PO TABS: 10.0000 mg | ORAL_TABLET | Freq: Every day | ORAL | 1 refills | Status: DC

## 2018-08-22 NOTE — Progress Notes (Signed)
Subjective:  Patient ID: Alyssa [name], female    DOB: 1924-12-31  Age: 85 y.o. MRN: [id number]  CC: Hypothyroidism and Allergic Rhinitis    HPI Alyssa [name] presents for f/up - She complains of a several week history of nontraumatic pain into her left buttocks that radiates into her left lower extremities.  She denies paresthesias.  Outpatient Medications Prior to Visit  Medication Sig Dispense Refill  . acetaminophen (TYLENOL ARTHRITIS PAIN) 650 MG CR tablet Take 1,300 mg by mouth 2 (two) times daily.      . clonazePAM (KLONOPIN) 0.5 MG tablet Take 1 tablet by mouth twice daily as needed for anxiety 180 tablet 0  . furosemide (LASIX) 40 MG tablet Take 1 tablet (40 mg total) by mouth daily. 90 tablet 3  . levothyroxine (SYNTHROID) 88 MCG tablet Take 1 tablet (88 mcg total) by mouth daily before breakfast. 90 tablet 1  . olmesartan (BENICAR) 20 MG tablet Take 1 tablet by mouth once daily 90 tablet 0  . potassium chloride SA (KLOR-CON M20) 20 MEQ tablet Take 1 tablet (20 mEq total) by mouth daily. 90 tablet 3  . triamcinolone (NASACORT) 55 MCG/ACT AERO nasal inhaler Place 2 sprays into the nose daily. 32.4 mL 1  . warfarin (COUMADIN) 3 MG tablet TAKE AS DIRECTED BY MOUTH BY  COUMADIN  CLINIC 90 tablet 1   No facility-administered medications prior to visit.     ROS Review of Systems  Constitutional: Negative.  Negative for chills, fatigue and fever.  HENT: Positive for congestion, postnasal drip and rhinorrhea. Negative for sinus pressure, sinus pain and trouble swallowing.   Eyes: Negative for visual disturbance.  Respiratory: Negative for cough, chest tightness, shortness of breath and wheezing.   Cardiovascular: Negative for chest pain, palpitations and leg swelling.  Gastrointestinal: Negative for abdominal pain, constipation, diarrhea, nausea and vomiting.  Endocrine: Negative for cold intolerance and heat intolerance.  Genitourinary: Negative.  Negative for difficulty urinating,  dysuria and hematuria.  Musculoskeletal: Positive for arthralgias. Negative for myalgias and neck pain.  Skin: Negative.  Negative for color change.  Neurological: Negative.  Negative for dizziness and weakness.  Hematological: Negative for adenopathy. Does not bruise/bleed easily.  Psychiatric/Behavioral: Negative.     Objective:  BP 128/66 (BP Location: Left Arm, Patient Position: Sitting, Cuff Size: Normal)   Pulse 82   Temp 98.2 F (36.8 C) (Oral)   Resp 16   Ht 5' 6.5" (1.689 m)   Wt 123 lb 12 oz (56.1 kg)   SpO2 95%   BMI 19.67 kg/m   BP Readings from Last 3 Encounters:  08/22/18 128/66  05/26/18 124/62  03/20/18 140/70    Wt Readings from Last 3 Encounters:  08/22/18 123 lb 12 oz (56.1 kg)  03/20/18 129 lb 4 oz (58.6 kg)  03/07/18 131 lb 12 oz (59.8 kg)    Physical Exam Vitals signs reviewed.  Constitutional:      Appearance: She is not ill-appearing or diaphoretic.  HENT:     Nose: Nose normal.     Mouth/Throat:     Mouth: Mucous membranes are moist.     Pharynx: No oropharyngeal exudate.  Eyes:     General: No scleral icterus.    Conjunctiva/sclera: Conjunctivae normal.  Neck:     Musculoskeletal: Normal range of motion. No neck rigidity or muscular tenderness.  Cardiovascular:     Rate and Rhythm: Normal rate and regular rhythm.     Heart sounds: Murmur present. Systolic murmur  present with a grade of 2/6. No diastolic murmur.  Pulmonary:     Breath sounds: No stridor. No wheezing, rhonchi or rales.  Abdominal:     General: Abdomen is flat.     Palpations: There is no mass.     Tenderness: There is no abdominal tenderness. There is no guarding.  Musculoskeletal: Normal range of motion.     Left hip: Normal. She exhibits normal range of motion, normal strength, no tenderness, no bony tenderness, no swelling, no crepitus and no deformity.     Left knee: Normal.     Right lower leg: No edema.     Left lower leg: Normal. No edema.  Lymphadenopathy:      Cervical: No cervical adenopathy.  Skin:    General: Skin is warm and dry.  Neurological:     General: No focal deficit present.     Mental Status: She is oriented to person, place, and time. Mental status is at baseline.     Motor: Motor function is intact. No weakness or abnormal muscle tone.     Coordination: Coordination is intact.     Deep Tendon Reflexes: Reflexes are normal and symmetric.  Psychiatric:        Mood and Affect: Mood normal.        Behavior: Behavior normal.     Lab Results  Component Value Date   WBC 4.4 05/30/2018   HGB 12.2 05/30/2018   HCT 36.2 05/30/2018   PLT 193 05/30/2018   GLUCOSE 97 08/10/2018   CHOL 155 02/11/2014   TRIG 84.0 02/11/2014   HDL 46.10 02/11/2014   LDLCALC 92 02/11/2014   ALT 19 02/21/2018   AST 23 02/21/2018   NA 142 08/10/2018   K 4.6 08/10/2018   CL 103 08/10/2018   CREATININE 1.42 (H) 08/10/2018   BUN 35 08/10/2018   CO2 23 08/10/2018   TSH 0.68 08/22/2018   INR 1.8 (A) 08/10/2018   HGBA1C 6.0 02/20/2018    Dg Sacrum/coccyx  Result Date: 03/08/2018 CLINICAL DATA:  Fall.  Persistent pain. EXAM: SACRUM AND COCCYX - 2+ VIEW COMPARISON:  02/12/2018.  CT abdomen 08/29/2014. FINDINGS: Diffuse osteopenia. No evidence of acute fracture. No interim change from prior exam. Pelvic calcifications consistent phleboliths noted. Pelvic calcifications consistent with calcified fibroid again noted. IMPRESSION: Diffuse osteopenia. No evidence of acute fracture. No interim change from prior exam. Electronically Signed   By: Marcello Moores  Register   On: 03/08/2018 07:41    Assessment & Plan:   Alyssa [name] was seen today for hypothyroidism and allergic rhinitis .  Diagnoses and all orders for this visit:  Seasonal allergic rhinitis due to pollen -     levocetirizine (XYZAL) 5 MG tablet; Take 1 tablet (5 mg total) by mouth every evening. -     montelukast (SINGULAIR) 10 MG tablet; Take 1 tablet (10 mg total) by mouth at bedtime.  Essential  hypertension, benign- Her blood pressure is adequately well controlled.  Acquired hypothyroidism- her TSH is in the normal range.  She will stay on the current dose of levothyroxine. -     TSH; Future  Sciatica of left side- NSAIDs are contraindicated.  I have asked her to read the attached instructions regarding exercises and treatment options for sciatica.   I am having Alyssa [name] start on levocetirizine and montelukast. I am also having her maintain her acetaminophen, triamcinolone, warfarin, levothyroxine, olmesartan, clonazePAM, potassium chloride SA, and furosemide.  Meds ordered this encounter  Medications  .  levocetirizine (XYZAL) 5 MG tablet    Sig: Take 1 tablet (5 mg total) by mouth every evening.    Dispense:  90 tablet    Refill:  1  . montelukast (SINGULAIR) 10 MG tablet    Sig: Take 1 tablet (10 mg total) by mouth at bedtime.    Dispense:  90 tablet    Refill:  1     Follow-up: Return in about 3 months (around 11/22/2018).  [name], MD

## 2018-08-22 NOTE — Patient Instructions (Signed)

## 2018-08-25 DIAGNOSIS — M5432 Sciatica, left side: Secondary | ICD-10-CM | POA: Insufficient documentation

## 2018-09-06 ENCOUNTER — Telehealth: Payer: Self-pay | Admitting: Internal Medicine

## 2018-09-06 NOTE — Telephone Encounter (Signed)
   Primary Cardiologist: Virl Axe, MD  Chart reviewed as part of pre-operative protocol coverage. Simple dental extractions are considered low risk procedures per guidelines and generally do not require any specific cardiac clearance. It is also generally accepted that for simple extractions and dental cleanings, there is no need to interrupt blood thinner therapy.   SBE prophylaxis is not required for the patient.  I will route this recommendation to the requesting party via Epic fax function and remove from pre-op pool.  Please call with questions.  Charlie Pitter, PA-C 09/06/2018, 4:04 PM

## 2018-09-06 NOTE — Telephone Encounter (Signed)
Copied from [place] [id number]. Topic: General - Other >> Sep 06, 2018  9:49 AM Keene Breath wrote: Reason for CRM: Caryl Pina with [facility] Called to request medical clearance to do an extraction for patient.  Please advise and call back or fax clearance at [phone]

## 2018-09-06 NOTE — Telephone Encounter (Signed)
Spoke to pt and informed of same.  Pt will call cards for clearance.

## 2018-09-06 NOTE — Telephone Encounter (Signed)
New Message   Patient is calling in stating that she is having a tooth extraction by [facility] (Dr. Wendie Agreste) and she has went through her PCP to get approval which there are notes in the system that they will approve it, but states that she will need Cardiac Clearance first.  Advised patient to have Dental Office call in to get the dental clearance complete.

## 2018-09-06 NOTE — Telephone Encounter (Signed)
Recommend continuing warfarin for single dental extraction.

## 2018-09-06 NOTE — Telephone Encounter (Signed)
I called [employer] and verbally obtained the information needed as Caryl Pina states they do not use a clearance form.      [facility] Pre-operative Risk Assessment    Request for surgical clearance:  1. What type of surgery is being performed? 1 TOOTH EXTRACTION   2. When is this surgery scheduled? TBD  3. What type of clearance is required (medical clearance vs. Pharmacy clearance to hold med vs. Both)? BOTH  4. Are there any medications that need to be held prior to surgery and how long? COUMADIN  5. Practice name and name of physician performing surgery? [facility]; DR. Harle Battiest  6. What is your office phone number [phone]   7.   What is your office fax number [phone]  8.   Anesthesia type (None, local, MAC, general) ? LOCAL   Alyssa [name] 09/06/2018, 3:44 PM  _________________________________________________________________   (provider comments below)

## 2018-09-06 NOTE — Telephone Encounter (Signed)
Please advise if pt is cleared for dental extraction.

## 2018-09-06 NOTE — Telephone Encounter (Signed)
She will have to get cardiac clearance first  TJ

## 2018-09-07 NOTE — Telephone Encounter (Signed)
Pt informed of clearance.   Will fax clearance dental office.

## 2018-09-07 NOTE — Telephone Encounter (Signed)
Pt contacted Cardiology:   Primary Cardiologist: Virl Axe, MD  Chart reviewed as part of pre-operative protocol coverage. Simple dental extractions are considered low risk procedures per guidelines and generally do not require any specific cardiac clearance. It is also generally accepted that for simple extractions and dental cleanings, there is no need to interrupt blood thinner therapy.   SBE prophylaxis is not required for the patient.  I will route this recommendation to the requesting party via Epic fax function and remove from pre-op pool.  Please call with questions.  Charlie Pitter, PA-C 09/06/2018, 4:04 PM

## 2018-09-07 NOTE — Telephone Encounter (Signed)
Yes, she is cleared  TJ

## 2018-09-11 NOTE — Telephone Encounter (Signed)
Letter has been faxed.

## 2018-09-14 ENCOUNTER — Ambulatory Visit (INDEPENDENT_AMBULATORY_CARE_PROVIDER_SITE_OTHER): Payer: Medicare Other | Admitting: *Deleted

## 2018-09-14 ENCOUNTER — Other Ambulatory Visit: Payer: Self-pay

## 2018-09-14 DIAGNOSIS — I4891 Unspecified atrial fibrillation: Secondary | ICD-10-CM | POA: Diagnosis not present

## 2018-09-14 DIAGNOSIS — Z85828 Personal history of other malignant neoplasm of skin: Secondary | ICD-10-CM | POA: Diagnosis not present

## 2018-09-14 DIAGNOSIS — L603 Nail dystrophy: Secondary | ICD-10-CM | POA: Diagnosis not present

## 2018-09-14 DIAGNOSIS — Z5181 Encounter for therapeutic drug level monitoring: Secondary | ICD-10-CM

## 2018-09-14 LAB — POCT INR: INR: 3.8 — AB (ref 2.0–3.0)

## 2018-09-14 NOTE — Patient Instructions (Signed)
Description   Do not take any Coumadin tomorrow and take 1/2 tablet on Saturday then continue taking 1 tablet daily except 1/2 on Fridays. Recheck in 2 weeks. Continue eating greens about 3 times a week. Call with any new or different medications [phone]

## 2018-09-21 ENCOUNTER — Other Ambulatory Visit: Payer: Self-pay | Admitting: Internal Medicine

## 2018-09-21 ENCOUNTER — Telehealth: Payer: Self-pay

## 2018-09-21 ENCOUNTER — Ambulatory Visit (INDEPENDENT_AMBULATORY_CARE_PROVIDER_SITE_OTHER): Payer: Medicare Other | Admitting: *Deleted

## 2018-09-21 DIAGNOSIS — I442 Atrioventricular block, complete: Secondary | ICD-10-CM

## 2018-09-21 DIAGNOSIS — J301 Allergic rhinitis due to pollen: Secondary | ICD-10-CM

## 2018-09-21 LAB — CUP PACEART REMOTE DEVICE CHECK
Battery Impedance: 421 Ohm
Battery Remaining Longevity: 89 mo
Battery Voltage: 2.79 V
Brady Statistic RV Percent Paced: 99 %
Date Time Interrogation Session: 20200813111228
Implantable Lead Implant Date: 20000921
Implantable Lead Implant Date: 20000921
Implantable Lead Location: 753859
Implantable Lead Location: 753860
Implantable Pulse Generator Implant Date: 20170118
Lead Channel Impedance Value: 605 Ohm
Lead Channel Pacing Threshold Amplitude: 0.875 V
Lead Channel Pacing Threshold Pulse Width: 0.4 ms
Lead Channel Setting Pacing Amplitude: 2.5 V
Lead Channel Setting Pacing Pulse Width: 0.4 ms
Lead Channel Setting Sensing Sensitivity: 2 mV

## 2018-09-21 MED ORDER — AZELASTINE HCL 0.1 % NA SOLN: 1.0000 | Freq: Two times a day (BID) | NASAL | 1 refills | Status: DC

## 2018-09-21 NOTE — Telephone Encounter (Signed)
Pt contacted. Pt is not taking the Xyzal or the Singulair. Pt stated that they make her very sleepy. Pt stated that she is not taking the Nasacort. Asked pt if she has tried anything OTC, pt stated that he Mucinex but that also makes her sleepy.   Pt wanted to know if there is anything else that can be tried to assist with the post nasal drip and mucus in her throat.   Please advise

## 2018-09-21 NOTE — Telephone Encounter (Signed)
Copied from [place] [id number]. Topic: General - Other >> Sep 21, 2018 10:03 AM Leward Quan A wrote: Reason for CRM: Patient called to say that she have a gravely feeling in her throat and asking Dr [name] if he can prescribe something to get rid of the mucus in the back of her throat. States that she has no cough but cant seem to clear her throat properly. Ph# [phone]

## 2018-09-21 NOTE — Telephone Encounter (Signed)
Try Astelin nasal spray. Prescription has been sent

## 2018-09-21 NOTE — Telephone Encounter (Signed)
Pt informed rx has been sent.  

## 2018-09-25 ENCOUNTER — Ambulatory Visit: Payer: Self-pay

## 2018-09-25 ENCOUNTER — Other Ambulatory Visit (INDEPENDENT_AMBULATORY_CARE_PROVIDER_SITE_OTHER): Payer: Medicare Other

## 2018-09-25 ENCOUNTER — Ambulatory Visit (INDEPENDENT_AMBULATORY_CARE_PROVIDER_SITE_OTHER): Payer: Medicare Other | Admitting: Internal Medicine

## 2018-09-25 ENCOUNTER — Encounter: Payer: Self-pay | Admitting: Internal Medicine

## 2018-09-25 ENCOUNTER — Other Ambulatory Visit: Payer: Self-pay

## 2018-09-25 VITALS — BP 122/66 | HR 56 | Temp 98.2°F | Resp 15 | Ht 66.5 in | Wt 121.0 lb

## 2018-09-25 DIAGNOSIS — N183 Chronic kidney disease, stage 3 unspecified: Secondary | ICD-10-CM

## 2018-09-25 DIAGNOSIS — K580 Irritable bowel syndrome with diarrhea: Secondary | ICD-10-CM

## 2018-09-25 DIAGNOSIS — I10 Essential (primary) hypertension: Secondary | ICD-10-CM

## 2018-09-25 DIAGNOSIS — E039 Hypothyroidism, unspecified: Secondary | ICD-10-CM

## 2018-09-25 DIAGNOSIS — I480 Paroxysmal atrial fibrillation: Secondary | ICD-10-CM | POA: Diagnosis not present

## 2018-09-25 LAB — CBC WITH DIFFERENTIAL/PLATELET
Basophils Absolute: 0 10*3/uL (ref 0.0–0.1)
Basophils Relative: 0.6 % (ref 0.0–3.0)
Eosinophils Absolute: 0.1 10*3/uL (ref 0.0–0.7)
Eosinophils Relative: 1.3 % (ref 0.0–5.0)
HCT: 37.3 % (ref 36.0–46.0)
Hemoglobin: 12.4 g/dL (ref 12.0–15.0)
Lymphocytes Relative: 33.5 % (ref 12.0–46.0)
Lymphs Abs: 1.9 10*3/uL (ref 0.7–4.0)
MCHC: 33.3 g/dL (ref 30.0–36.0)
MCV: 92 fl (ref 78.0–100.0)
Monocytes Absolute: 0.4 10*3/uL (ref 0.1–1.0)
Monocytes Relative: 7.9 % (ref 3.0–12.0)
Neutro Abs: 3.2 10*3/uL (ref 1.4–7.7)
Neutrophils Relative %: 56.7 % (ref 43.0–77.0)
Platelets: 193 10*3/uL (ref 150.0–400.0)
RBC: 4.06 Mil/uL (ref 3.87–5.11)
RDW: 12.1 % (ref 11.5–15.5)
WBC: 5.6 10*3/uL (ref 4.0–10.5)

## 2018-09-25 LAB — BASIC METABOLIC PANEL
BUN: 34 mg/dL — ABNORMAL HIGH (ref 6–23)
CO2: 28 mEq/L (ref 19–32)
Calcium: 9.2 mg/dL (ref 8.4–10.5)
Chloride: 102 mEq/L (ref 96–112)
Creatinine, Ser: 1.5 mg/dL — ABNORMAL HIGH (ref 0.40–1.20)
GFR: 32.45 mL/min — ABNORMAL LOW (ref >60.00)
Glucose, Bld: 102 mg/dL — ABNORMAL HIGH (ref 70–99)
Potassium: 5.3 mEq/L — ABNORMAL HIGH (ref 3.5–5.1)
Sodium: 137 mEq/L (ref 135–145)

## 2018-09-25 MED ORDER — VIBERZI 75 MG PO TABS: 1.0000 | ORAL_TABLET | Freq: Every day | ORAL | 0 refills | Status: DC

## 2018-09-25 NOTE — Telephone Encounter (Signed)
Pt called stating that today she felt dizzy and took her BP It was 94/54 HR 84.  She waited and rechecked it was up to 117/66 HR 77.  She states that she has trouble with low BP. Last year she fainted and ended up in ER. She states she has had some diarrhea but it is a chronic condition.  Care advice read to patient. She verbalized understanding. Call transferred to office for scheduling. Reason for Disposition . [2] Systolic BP [phone] AND [2] taking blood pressure medications AND [3] dizzy, lightheaded or weak  Answer Assessment - Initial Assessment Questions 1. BLOOD PRESSURE: "What is the blood pressure?" "Did you take at least two measurements 5 minutes apart?"     94/54 HR 84. 117/66 HR 79 2. ONSET: "When did you take your blood pressure?"    This Am 3. HOW: "How did you obtain the blood pressure?" (e.g., visiting nurse, automatic home BP monitor)     home 4. HISTORY: "Do you have a history of low blood pressure?" "What is your blood pressure normally?"     yes 5. MEDICATIONS: "Are you taking any medications for blood pressure?" If yes: "Have they been changed recently?"     Changed to 20 olmisartin 6. PULSE RATE: "Do you know what your pulse rate is?"     84 and 79 7. OTHER SYMPTOMS: "Have you been sick recently?" "Have you had a recent injury?"     Diarrhea, light headed 8. PREGNANCY: "Is there any chance you are pregnant?" "When was your last menstrual period?"     N/A  Protocols used: LOW BLOOD PRESSURE-A-AH

## 2018-09-25 NOTE — Patient Instructions (Signed)

## 2018-09-25 NOTE — Telephone Encounter (Signed)
Pt currently being seen by Dr [name]

## 2018-09-25 NOTE — Progress Notes (Signed)
Subjective:  Patient ID: Alyssa [name], female    DOB: [birth year]  Age: 85 y.o. MRN: [id number]  CC: Hypertension and Atrial Fibrillation   HPI Alyssa [name] presents for f/up - She complains of a one-week history of diarrhea.  She is having 1 loose stool a day.  She tells me this is her typical symptom of  IBS.  She found an outdated supply of Viberzi and has been taking it.  It has provided some symptom relief.  She denies abdominal pain, nausea, or vomiting.  She also complains that her blood pressure has been low and she has had episodes of dizziness and lightheadedness.  Outpatient Medications Prior to Visit  Medication Sig Dispense Refill  . acetaminophen (TYLENOL ARTHRITIS PAIN) 650 MG CR tablet Take 1,300 mg by mouth 2 (two) times daily.      Marland Kitchen azelastine (ASTELIN) 0.1 % nasal spray Place 1 spray into both nostrils 2 (two) times daily. Use in each nostril as directed 90 mL 1  . clonazePAM (KLONOPIN) 0.5 MG tablet Take 1 tablet by mouth twice daily as needed for anxiety 180 tablet 0  . furosemide (LASIX) 40 MG tablet Take 1 tablet (40 mg total) by mouth daily. 90 tablet 3  . levothyroxine (SYNTHROID) 88 MCG tablet Take 1 tablet (88 mcg total) by mouth daily before breakfast. 90 tablet 1  . montelukast (SINGULAIR) 10 MG tablet Take 1 tablet (10 mg total) by mouth at bedtime. 90 tablet 1  . potassium chloride SA (KLOR-CON M20) 20 MEQ tablet Take 1 tablet (20 mEq total) by mouth daily. 90 tablet 3  . triamcinolone (NASACORT) 55 MCG/ACT AERO nasal inhaler Place 2 sprays into the nose daily. 32.4 mL 1  . warfarin (COUMADIN) 3 MG tablet TAKE AS DIRECTED BY MOUTH BY  COUMADIN  CLINIC 90 tablet 1  . levocetirizine (XYZAL) 5 MG tablet Take 1 tablet (5 mg total) by mouth every evening. 90 tablet 1  . olmesartan (BENICAR) 20 MG tablet Take 1 tablet by mouth once daily 90 tablet 0   No facility-administered medications prior to visit.     ROS Review of Systems  Constitutional: Negative for  chills, diaphoresis, fatigue and unexpected weight change.  HENT: Negative.  Negative for trouble swallowing.   Eyes: Negative for visual disturbance.  Respiratory: Negative for cough, chest tightness, shortness of breath and wheezing.   Cardiovascular: Negative for chest pain, palpitations and leg swelling.  Gastrointestinal: Positive for diarrhea. Negative for abdominal pain, blood in stool, nausea and vomiting.  Endocrine: Negative.   Genitourinary: Negative.  Negative for difficulty urinating and hematuria.  Musculoskeletal: Negative.  Negative for myalgias.  Skin: Negative.  Negative for color change, pallor and rash.  Neurological: Positive for dizziness and light-headedness. Negative for weakness.  Hematological: Negative for adenopathy. Does not bruise/bleed easily.  Psychiatric/Behavioral: Negative.     Objective:  BP 122/66   Pulse (!) 56   Temp 98.2 F (36.8 C) (Temporal)   Resp 15   Ht 5' 6.5" (1.689 m)   Wt 121 lb (54.9 kg)   SpO2 94%   BMI 19.24 kg/m   BP Readings from Last 3 Encounters:  09/25/18 122/66  08/22/18 128/66  05/26/18 124/62    Wt Readings from Last 3 Encounters:  09/25/18 121 lb (54.9 kg)  08/22/18 123 lb 12 oz (56.1 kg)  03/20/18 129 lb 4 oz (58.6 kg)    Physical Exam Vitals signs reviewed.  Constitutional:      General:  She is not in acute distress.    Appearance: She is not ill-appearing, toxic-appearing or diaphoretic.  HENT:     Nose: Nose normal.     Mouth/Throat:     Mouth: Mucous membranes are moist.     Pharynx: No posterior oropharyngeal erythema.  Eyes:     General: No scleral icterus.    Conjunctiva/sclera: Conjunctivae normal.  Neck:     Musculoskeletal: Normal range of motion. No neck rigidity.  Cardiovascular:     Rate and Rhythm: Normal rate and regular rhythm.     Heart sounds: Murmur present. Diastolic murmur present with a grade of 1/4. No gallop.   Pulmonary:     Effort: Pulmonary effort is normal.     Breath  sounds: No stridor. No wheezing, rhonchi or rales.  Abdominal:     General: Abdomen is flat. Bowel sounds are normal. There is no distension.     Palpations: There is no hepatomegaly, splenomegaly or mass.     Tenderness: There is no abdominal tenderness. There is no guarding.  Musculoskeletal: Normal range of motion.     Right lower leg: No edema.     Left lower leg: No edema.  Skin:    General: Skin is warm and dry.     Coloration: Skin is not pale.  Neurological:     General: No focal deficit present.     Lab Results  Component Value Date   WBC 5.6 09/25/2018   HGB 12.4 09/25/2018   HCT 37.3 09/25/2018   PLT 193.0 09/25/2018   GLUCOSE 102 (H) 09/25/2018   CHOL 155 02/11/2014   TRIG 84.0 02/11/2014   HDL 46.10 02/11/2014   LDLCALC 92 02/11/2014   ALT 19 02/21/2018   AST 23 02/21/2018   NA 137 09/25/2018   K 5.3 (H) 09/25/2018   CL 102 09/25/2018   CREATININE 1.50 (H) 09/25/2018   BUN 34 (H) 09/25/2018   CO2 28 09/25/2018   TSH 0.68 08/22/2018   INR 3.8 (A) 09/14/2018   HGBA1C 6.0 02/20/2018    Dg Sacrum/coccyx  Result Date: 03/08/2018 CLINICAL DATA:  Fall.  Persistent pain. EXAM: SACRUM AND COCCYX - 2+ VIEW COMPARISON:  02/12/2018.  CT abdomen 08/29/2014. FINDINGS: Diffuse osteopenia. No evidence of acute fracture. No interim change from prior exam. Pelvic calcifications consistent phleboliths noted. Pelvic calcifications consistent with calcified fibroid again noted. IMPRESSION: Diffuse osteopenia. No evidence of acute fracture. No interim change from prior exam. Electronically Signed   By: Marcello Moores  Register   On: 03/08/2018 07:41    Assessment & Plan:   Alyssa [name] was seen today for hypertension and atrial fibrillation.  Diagnoses and all orders for this visit:  Essential hypertension, benign- She complains of episodes of low blood pressure.  She also has hyperkalemia and renal insufficiency.  I have asked her to stop taking the ARB. -     CBC with  Differential/Platelet; Future -     Basic metabolic panel; Future  Paroxysmal atrial fibrillation ([place])- She has good rate and rhythm control.  Acquired hypothyroidism- Her recent TSH was in the normal range.  Chronic renal insufficiency, stage III (moderate) ([place])- Her renal function is stable.  She will avoid nephrotoxic agents. -     CBC with Differential/Platelet; Future -     Basic metabolic panel; Future  Irritable bowel syndrome with diarrhea -     Eluxadoline (VIBERZI) 75 MG TABS; Take 1 tablet by mouth daily.   I have discontinued Alyssa [name]'s olmesartan  and levocetirizine. I am also having her start on Viberzi. Additionally, I am having her maintain her acetaminophen, triamcinolone, warfarin, levothyroxine, clonazePAM, potassium chloride SA, furosemide, montelukast, and azelastine.  Meds ordered this encounter  Medications  . Eluxadoline (VIBERZI) 75 MG TABS    Sig: Take 1 tablet by mouth daily.    Dispense:  56 tablet    Refill:  0     Follow-up: Return if symptoms worsen or fail to improve.  [name], MD

## 2018-09-26 ENCOUNTER — Encounter: Payer: Self-pay | Admitting: Internal Medicine

## 2018-09-28 ENCOUNTER — Ambulatory Visit (INDEPENDENT_AMBULATORY_CARE_PROVIDER_SITE_OTHER): Payer: Medicare Other | Admitting: *Deleted

## 2018-09-28 ENCOUNTER — Other Ambulatory Visit: Payer: Self-pay

## 2018-09-28 DIAGNOSIS — Z5181 Encounter for therapeutic drug level monitoring: Secondary | ICD-10-CM

## 2018-09-28 DIAGNOSIS — I4891 Unspecified atrial fibrillation: Secondary | ICD-10-CM | POA: Diagnosis not present

## 2018-09-28 LAB — POCT INR: INR: 4.3 — AB (ref 2.0–3.0)

## 2018-09-28 NOTE — Patient Instructions (Signed)
Description   Do not take any Coumadin on Friday (09/29/2018) or Saturday (09/30/2018) then start taking taking 1 tablet daily except for 1/2 a tablet on Mondays and Fridays. Recheck in 2 weeks. Continue eating greens about 3 times a week. Call with any new or different medications [phone]

## 2018-10-02 ENCOUNTER — Telehealth: Payer: Self-pay | Admitting: Internal Medicine

## 2018-10-02 ENCOUNTER — Encounter: Payer: Self-pay | Admitting: Cardiology

## 2018-10-02 NOTE — Telephone Encounter (Signed)
°  [facility] was calling to get the patient's recent Coumadin levels prior to her tooth extraction. The office requested a PT , but further clarified what they needed.  Anyone that answers the call back from the office will be able to take the information.

## 2018-10-02 NOTE — Telephone Encounter (Signed)
°  ° °  [facility] Pre-operative Risk Assessment    Request for surgical clearance:  1. What type of surgery is being performed? Tooth extraction  2. When is this surgery scheduled? TBD  3. What type of clearance is required (medical clearance vs. Pharmacy clearance to hold med vs. Both)? both  4. Are there any medications that need to be held prior to surgery and how long? Possibly coumadin, but that is up to Dr. Caryl Comes  5. Practice name and name of physician performing surgery? Dr. Ferdinand Lango at [facility]  6. What is your office phone number: [phone]   7.   What is your office fax number: office fax machine is broken. You can email the clearance to [email or web address]   8.   Anesthesia type (None, local, MAC, general) ? local  Office will also need a recent INR level for the patient prior to procedure. The patient was in the chair this morning, but the dental office will be rescheduling the patient.  Johnna Acosta 10/02/2018, 10:46 AM  _________________________________________________________________   (provider comments below)

## 2018-10-02 NOTE — Telephone Encounter (Signed)
   Primary Cardiologist: Virl Axe, MD  Chart reviewed as part of pre-operative protocol coverage. Simple dental extractions are considered low risk procedures per guidelines and generally do not require any specific cardiac clearance. It is also generally accepted that for simple extractions and dental cleanings, there is no need to interrupt blood thinner therapy.   SBE prophylaxis is not required for the patient.  I will route this recommendation to the requesting party via Epic fax function and remove from pre-op pool.  Please call with questions.  Ledora Bottcher, PA 10/02/2018, 12:35 PM

## 2018-10-02 NOTE — Progress Notes (Signed)
Remote pacemaker transmission.   

## 2018-10-02 NOTE — Telephone Encounter (Signed)
[facility] and made them aware of pt's INR from 09/28/2018. Made them aware that pt's next appointment would be 9/3 here at the coumadin clinic but that could change if needed. [facility] stated that they would schedule the tooth extraction 2-3 days after her next coumadin clinic visit on 9/3.

## 2018-10-02 NOTE — Telephone Encounter (Signed)
Emailed as requested.

## 2018-10-02 NOTE — Telephone Encounter (Signed)
Caryl Pina notified they did receive

## 2018-10-12 ENCOUNTER — Ambulatory Visit (INDEPENDENT_AMBULATORY_CARE_PROVIDER_SITE_OTHER): Payer: Medicare Other | Admitting: *Deleted

## 2018-10-12 ENCOUNTER — Telehealth: Payer: Self-pay | Admitting: Internal Medicine

## 2018-10-12 ENCOUNTER — Other Ambulatory Visit: Payer: Self-pay

## 2018-10-12 DIAGNOSIS — Z5181 Encounter for therapeutic drug level monitoring: Secondary | ICD-10-CM | POA: Diagnosis not present

## 2018-10-12 DIAGNOSIS — I4891 Unspecified atrial fibrillation: Secondary | ICD-10-CM | POA: Diagnosis not present

## 2018-10-12 LAB — POCT INR: INR: 4.8 — AB (ref 2.0–3.0)

## 2018-10-12 NOTE — Telephone Encounter (Signed)
Pt has had severe diarrhea for 2 months that she can not get rid of and she wants to speak with Alyssa [name] about what Dr. [name] would want her to do or if she needs to be seen/ please advise

## 2018-10-12 NOTE — Patient Instructions (Signed)
Description   Do not take any Coumadin on Friday (10/13/2018) or Saturday (10/14/2018) then start taking taking 1/2 tablet daily except for 1 tablet on Tuesdays, Thursdays, and Saturdays. Recheck in 11 days. Continue eating greens about 3 times a week. Call with any new or different medications [phone]

## 2018-10-13 NOTE — Telephone Encounter (Signed)
Pt contacted and we reviewed her medications.  Pt has been taking the viberzi but not correctly.  She stated that she took 2 in one day and had constipation and then mentions that if she takes one a day it holds symptoms for 3 days. I educated pt that her IBS is a longer term condition and she will need to continue the viberzi once a day or once every other day. If constipation is experienced then she can stop an extra day.    Pt stated understanding and has been scheduled to receive her flu vaccine.

## 2018-10-19 ENCOUNTER — Ambulatory Visit (INDEPENDENT_AMBULATORY_CARE_PROVIDER_SITE_OTHER): Payer: Medicare Other

## 2018-10-19 DIAGNOSIS — Z23 Encounter for immunization: Secondary | ICD-10-CM | POA: Diagnosis not present

## 2018-10-23 ENCOUNTER — Ambulatory Visit (INDEPENDENT_AMBULATORY_CARE_PROVIDER_SITE_OTHER): Payer: Medicare Other | Admitting: *Deleted

## 2018-10-23 ENCOUNTER — Other Ambulatory Visit: Payer: Self-pay

## 2018-10-23 DIAGNOSIS — I4891 Unspecified atrial fibrillation: Secondary | ICD-10-CM | POA: Diagnosis not present

## 2018-10-23 DIAGNOSIS — Z5181 Encounter for therapeutic drug level monitoring: Secondary | ICD-10-CM | POA: Diagnosis not present

## 2018-10-23 LAB — POCT INR: INR: 2 (ref 2.0–3.0)

## 2018-10-23 NOTE — Patient Instructions (Signed)
Description   Continue taking 1/2 tablet daily except for 1 tablet on Tuesdays, Thursdays, and Saturdays. Recheck in 3 weeks. Continue eating greens about 3 times a week. Call with any new or different medications [phone]

## 2018-11-14 ENCOUNTER — Other Ambulatory Visit: Payer: Self-pay

## 2018-11-14 ENCOUNTER — Ambulatory Visit (INDEPENDENT_AMBULATORY_CARE_PROVIDER_SITE_OTHER): Payer: Medicare Other | Admitting: *Deleted

## 2018-11-14 DIAGNOSIS — Z5181 Encounter for therapeutic drug level monitoring: Secondary | ICD-10-CM

## 2018-11-14 DIAGNOSIS — I4891 Unspecified atrial fibrillation: Secondary | ICD-10-CM

## 2018-11-14 LAB — POCT INR: INR: 2.1 (ref 2.0–3.0)

## 2018-11-14 NOTE — Patient Instructions (Signed)
Description   Continue taking 1/2 tablet daily except for 1 tablet on Tuesdays, Thursdays, and Saturdays. Recheck in 4 weeks. Continue eating greens about 3 times a week. Call with any new or different medications [phone]

## 2018-11-25 ENCOUNTER — Other Ambulatory Visit: Payer: Self-pay | Admitting: Internal Medicine

## 2018-11-25 DIAGNOSIS — E038 Other specified hypothyroidism: Secondary | ICD-10-CM

## 2018-12-09 ENCOUNTER — Other Ambulatory Visit: Payer: Self-pay | Admitting: Internal Medicine

## 2018-12-12 ENCOUNTER — Other Ambulatory Visit: Payer: Self-pay

## 2018-12-12 ENCOUNTER — Ambulatory Visit (INDEPENDENT_AMBULATORY_CARE_PROVIDER_SITE_OTHER): Payer: Medicare Other

## 2018-12-12 DIAGNOSIS — Z5181 Encounter for therapeutic drug level monitoring: Secondary | ICD-10-CM | POA: Diagnosis not present

## 2018-12-12 DIAGNOSIS — I4891 Unspecified atrial fibrillation: Secondary | ICD-10-CM

## 2018-12-12 LAB — POCT INR: INR: 1.7 — AB (ref 2.0–3.0)

## 2018-12-12 NOTE — Patient Instructions (Signed)
Description   Take 1.5 tablets today, then continue taking 1/2 tablet daily except for 1 tablet on Tuesdays, Thursdays, and Saturdays. Recheck in 3 weeks. Continue eating greens about 2-3 times a week. Call with any new or different medications [phone]

## 2018-12-21 ENCOUNTER — Ambulatory Visit (INDEPENDENT_AMBULATORY_CARE_PROVIDER_SITE_OTHER): Payer: Medicare Other | Admitting: *Deleted

## 2018-12-21 DIAGNOSIS — I4891 Unspecified atrial fibrillation: Secondary | ICD-10-CM | POA: Diagnosis not present

## 2018-12-21 DIAGNOSIS — I442 Atrioventricular block, complete: Secondary | ICD-10-CM

## 2018-12-21 LAB — CUP PACEART REMOTE DEVICE CHECK
Battery Impedance: 446 Ohm
Battery Remaining Longevity: 87 mo
Battery Voltage: 2.79 V
Brady Statistic RV Percent Paced: 99 %
Date Time Interrogation Session: 20201112134944
Implantable Lead Implant Date: 20000921
Implantable Lead Implant Date: 20000921
Implantable Lead Location: 753859
Implantable Lead Location: 753860
Implantable Pulse Generator Implant Date: 20170118
Lead Channel Impedance Value: 580 Ohm
Lead Channel Impedance Value: 67 Ohm
Lead Channel Pacing Threshold Amplitude: 0.75 V
Lead Channel Pacing Threshold Pulse Width: 0.4 ms
Lead Channel Setting Pacing Amplitude: 2.5 V
Lead Channel Setting Pacing Pulse Width: 0.4 ms
Lead Channel Setting Sensing Sensitivity: 2 mV

## 2018-12-28 ENCOUNTER — Telehealth: Payer: Self-pay

## 2018-12-28 ENCOUNTER — Other Ambulatory Visit: Payer: Self-pay

## 2018-12-28 ENCOUNTER — Encounter: Payer: Self-pay | Admitting: Internal Medicine

## 2018-12-28 ENCOUNTER — Other Ambulatory Visit (INDEPENDENT_AMBULATORY_CARE_PROVIDER_SITE_OTHER): Payer: Medicare Other

## 2018-12-28 ENCOUNTER — Ambulatory Visit (INDEPENDENT_AMBULATORY_CARE_PROVIDER_SITE_OTHER): Payer: Medicare Other | Admitting: Internal Medicine

## 2018-12-28 ENCOUNTER — Ambulatory Visit (INDEPENDENT_AMBULATORY_CARE_PROVIDER_SITE_OTHER)
Admission: RE | Admit: 2018-12-28 | Discharge: 2018-12-28 | Disposition: A | Discharge status: Home / Self Care | Payer: Medicare Other | Source: Ambulatory Visit | Attending: Internal Medicine | Admitting: Internal Medicine

## 2018-12-28 VITALS — BP 160/82 | HR 81 | Temp 98.1°F | Ht 66.5 in | Wt 121.8 lb

## 2018-12-28 DIAGNOSIS — N1832 Chronic kidney disease, stage 3b: Secondary | ICD-10-CM | POA: Diagnosis not present

## 2018-12-28 DIAGNOSIS — M8000XA Age-related osteoporosis with current pathological fracture, unspecified site, initial encounter for fracture: Secondary | ICD-10-CM

## 2018-12-28 DIAGNOSIS — M546 Pain in thoracic spine: Secondary | ICD-10-CM | POA: Diagnosis not present

## 2018-12-28 DIAGNOSIS — E039 Hypothyroidism, unspecified: Secondary | ICD-10-CM

## 2018-12-28 DIAGNOSIS — M5442 Lumbago with sciatica, left side: Secondary | ICD-10-CM | POA: Diagnosis not present

## 2018-12-28 DIAGNOSIS — M545 Low back pain: Secondary | ICD-10-CM | POA: Diagnosis not present

## 2018-12-28 DIAGNOSIS — I10 Essential (primary) hypertension: Secondary | ICD-10-CM

## 2018-12-28 DIAGNOSIS — M8008XD Age-related osteoporosis with current pathological fracture, vertebra(e), subsequent encounter for fracture with routine healing: Secondary | ICD-10-CM

## 2018-12-28 DIAGNOSIS — M8008XA Age-related osteoporosis with current pathological fracture, vertebra(e), initial encounter for fracture: Secondary | ICD-10-CM | POA: Insufficient documentation

## 2018-12-28 LAB — BASIC METABOLIC PANEL
BUN: 34 mg/dL — ABNORMAL HIGH (ref 6–23)
CO2: 28 mEq/L (ref 19–32)
Calcium: 9 mg/dL (ref 8.4–10.5)
Chloride: 105 mEq/L (ref 96–112)
Creatinine, Ser: 1.18 mg/dL (ref 0.40–1.20)
GFR: 42.78 mL/min — ABNORMAL LOW (ref >60.00)
Glucose, Bld: 77 mg/dL (ref 70–99)
Potassium: 4.6 mEq/L (ref 3.5–5.1)
Sodium: 142 mEq/L (ref 135–145)

## 2018-12-28 LAB — TSH: TSH: 0.48 u[IU]/mL (ref 0.35–4.50)

## 2018-12-28 LAB — MAGNESIUM: Magnesium: 2 mg/dL (ref 1.5–2.5)

## 2018-12-28 MED ORDER — CALCIUM CARBONATE-VITAMIN D 500-200 MG-UNIT PO TABS: 1.0000 | ORAL_TABLET | Freq: Two times a day (BID) | ORAL | 1 refills | Status: DC

## 2018-12-28 NOTE — Patient Instructions (Signed)
Acute Back Pain, Adult Acute back pain is sudden and usually short-lived. It is often caused by an injury to the muscles and tissues in the back. The injury may result from:  A muscle or ligament getting overstretched or torn (strained). Ligaments are tissues that connect bones to each other. Lifting something improperly can cause a back strain.  Wear and tear (degeneration) of the spinal disks. Spinal disks are circular tissue that provides cushioning between the bones of the spine (vertebrae).  Twisting motions, such as while playing sports or doing yard work.  A hit to the back.  Arthritis. You may have a physical exam, lab tests, and imaging tests to find the cause of your pain. Acute back pain usually goes away with rest and home care. Follow these instructions at home: Managing pain, stiffness, and swelling  Take over-the-counter and prescription medicines only as told by your health care provider.  Your health care provider may recommend applying ice during the first 24-48 hours after your pain starts. To do this: ? Put ice in a plastic bag. ? Place a towel between your skin and the bag. ? Leave the ice on for 20 minutes, 2-3 times a day.  If directed, apply heat to the affected area as often as told by your health care provider. Use the heat source that your health care provider recommends, such as a moist heat pack or a heating pad. ? Place a towel between your skin and the heat source. ? Leave the heat on for 20-30 minutes. ? Remove the heat if your skin turns bright red. This is especially important if you are unable to feel pain, heat, or cold. You have a greater risk of getting burned. Activity   Do not stay in bed. Staying in bed for more than 1-2 days can delay your recovery.  Sit up and stand up straight. Avoid leaning forward when you sit, or hunching over when you stand. ? If you work at a desk, sit close to it so you do not need to lean over. Keep your chin tucked  in. Keep your neck drawn back, and keep your elbows bent at a right angle. Your arms should look like the letter "L." ? Sit high and close to the steering wheel when you drive. Add lower back (lumbar) support to your car seat, if needed.  Take short walks on even surfaces as soon as you are able. Try to increase the length of time you walk each day.  Do not sit, drive, or stand in one place for more than 30 minutes at a time. Sitting or standing for long periods of time can put stress on your back.  Do not drive or use heavy machinery while taking prescription pain medicine.  Use proper lifting techniques. When you bend and lift, use positions that put less stress on your back: ? Bend your knees. ? Keep the load close to your body. ? Avoid twisting.  Exercise regularly as told by your health care provider. Exercising helps your back heal faster and helps prevent back injuries by keeping muscles strong and flexible.  Work with a physical therapist to make a safe exercise program, as recommended by your health care provider. Do any exercises as told by your physical therapist. Lifestyle  Maintain a healthy weight. Extra weight puts stress on your back and makes it difficult to have good posture.  Avoid activities or situations that make you feel anxious or stressed. Stress and anxiety increase muscle   tension and can make back pain worse. Learn ways to manage anxiety and stress, such as through exercise. General instructions  Sleep on a firm mattress in a comfortable position. Try lying on your side with your knees slightly bent. If you lie on your back, put a pillow under your knees.  Follow your treatment plan as told by your health care provider. This may include: ? Cognitive or behavioral therapy. ? Acupuncture or massage therapy. ? Meditation or yoga. Contact a health care provider if:  You have pain that is not relieved with rest or medicine.  You have increasing pain going down  into your legs or buttocks.  Your pain does not improve after 2 weeks.  You have pain at night.  You lose weight without trying.  You have a fever or chills. Get help right away if:  You develop new bowel or bladder control problems.  You have unusual weakness or numbness in your arms or legs.  You develop nausea or vomiting.  You develop abdominal pain.  You feel faint. Summary  Acute back pain is sudden and usually short-lived.  Use proper lifting techniques. When you bend and lift, use positions that put less stress on your back.  Take over-the-counter and prescription medicines and apply heat or ice as directed by your health care provider. This information is not intended to replace advice given to you by your health care provider. Make sure you discuss any questions you have with your health care provider. Document Released: 01/25/2005 Document Revised: 05/16/2018 Document Reviewed: 09/08/2016 Elsevier Patient Education  2020 [employer]  

## 2018-12-28 NOTE — Telephone Encounter (Signed)
Patient seen in office today. Requested to have Vitamin D and calcium called in for her to take. Would like for it to be sent to [facility] on [address].    Please advise

## 2018-12-28 NOTE — Progress Notes (Signed)
Subjective:  Patient ID: Alyssa [name], female    DOB: 04/06/1924  Age: 85 y.o. MRN: [id number]  CC: Hypertension, Hypothyroidism, and Back Pain   HPI Alyssa [name] presents for f/up - She complains of a several month history of back pain that is somewhat worse than her usual back pain.  She tells me that a couple of months ago she twisted and since then has had aching in her lower back that does not radiate into her lower extremities.  She also denies lower extremity paresthesias.  She is getting adequate symptom relief with Tylenol.  Outpatient Medications Prior to Visit  Medication Sig Dispense Refill  . acetaminophen (TYLENOL ARTHRITIS PAIN) 650 MG CR tablet Take 1,300 mg by mouth 2 (two) times daily.      Marland Kitchen azelastine (ASTELIN) 0.1 % nasal spray Place 1 spray into both nostrils 2 (two) times daily. Use in each nostril as directed 90 mL 1  . clonazePAM (KLONOPIN) 0.5 MG tablet Take 1 tablet by mouth twice daily as needed for anxiety 180 tablet 0  . Eluxadoline (VIBERZI) 75 MG TABS Take 1 tablet by mouth daily. 56 tablet 0  . furosemide (LASIX) 40 MG tablet Take 1 tablet (40 mg total) by mouth daily. 90 tablet 3  . levothyroxine (SYNTHROID) 88 MCG tablet TAKE 1 TABLET BY MOUTH ONCE DAILY BEFORE BREAKFAST 90 tablet 0  . montelukast (SINGULAIR) 10 MG tablet Take 1 tablet (10 mg total) by mouth at bedtime. 90 tablet 1  . triamcinolone (NASACORT) 55 MCG/ACT AERO nasal inhaler Place 2 sprays into the nose daily. 32.4 mL 1  . warfarin (COUMADIN) 3 MG tablet TAKE BY MOUTH AS DIRECTED BY COUMADIN CLINIC 70 tablet 0  . potassium chloride SA (KLOR-CON M20) 20 MEQ tablet Take 1 tablet (20 mEq total) by mouth daily. 90 tablet 3   No facility-administered medications prior to visit.     ROS Review of Systems  Constitutional: Negative.  Negative for chills, diaphoresis, fatigue and fever.  HENT: Negative.   Respiratory: Negative for cough, chest tightness, shortness of breath and wheezing.    Cardiovascular: Negative for chest pain, palpitations and leg swelling.  Gastrointestinal: Negative for abdominal pain, constipation, diarrhea, nausea and vomiting.  Endocrine: Negative.  Negative for cold intolerance and heat intolerance.  Genitourinary: Negative.  Negative for difficulty urinating.  Musculoskeletal: Positive for back pain. Negative for arthralgias, myalgias and neck pain.  Skin: Negative.  Negative for color change and pallor.  Neurological: Negative.  Negative for dizziness, weakness and light-headedness.  Hematological: Negative for adenopathy. Does not bruise/bleed easily.  Psychiatric/Behavioral: Negative.     Objective:  BP (!) 160/82 (BP Location: Left Arm, Patient Position: Sitting, Cuff Size: Normal)   Pulse 81   Temp 98.1 F (36.7 C) (Oral)   Ht 5' 6.5" (1.689 m)   Wt 121 lb 12 oz (55.2 kg)   SpO2 98%   BMI 19.36 kg/m   BP Readings from Last 3 Encounters:  12/28/18 (!) 160/82  09/25/18 122/66  08/22/18 128/66    Wt Readings from Last 3 Encounters:  12/28/18 121 lb 12 oz (55.2 kg)  09/25/18 121 lb (54.9 kg)  08/22/18 123 lb 12 oz (56.1 kg)    Physical Exam Vitals signs reviewed.  Constitutional:      Appearance: Normal appearance.  HENT:     Nose: Nose normal.     Mouth/Throat:     Mouth: Mucous membranes are moist.  Eyes:     General:  No scleral icterus.    Conjunctiva/sclera: Conjunctivae normal.  Neck:     Musculoskeletal: No muscular tenderness.  Cardiovascular:     Rate and Rhythm: Normal rate and regular rhythm.     Heart sounds: Murmur present.  Pulmonary:     Breath sounds: No stridor. No wheezing, rhonchi or rales.  Abdominal:     General: Abdomen is flat.     Palpations: There is no mass.     Tenderness: There is no abdominal tenderness. There is no guarding.  Musculoskeletal: Normal range of motion.        General: No swelling.     Lumbar back: She exhibits pain. She exhibits normal range of motion, no tenderness, no  bony tenderness, no swelling, no edema and no deformity.     Right lower leg: No edema.     Left lower leg: No edema.  Lymphadenopathy:     Cervical: No cervical adenopathy.  Skin:    General: Skin is warm and dry.  Neurological:     General: No focal deficit present.     Mental Status: She is alert.     Cranial Nerves: Cranial nerves are intact.     Motor: Motor function is intact.     Coordination: Coordination is intact.     Deep Tendon Reflexes: Reflexes are normal and symmetric.     Comments: Neg SLR in BLE     Lab Results  Component Value Date   WBC 5.6 09/25/2018   HGB 12.4 09/25/2018   HCT 37.3 09/25/2018   PLT 193.0 09/25/2018   GLUCOSE 77 12/28/2018   CHOL 155 02/11/2014   TRIG 84.0 02/11/2014   HDL 46.10 02/11/2014   LDLCALC 92 02/11/2014   ALT 19 02/21/2018   AST 23 02/21/2018   NA 142 12/28/2018   K 4.6 12/28/2018   CL 105 12/28/2018   CREATININE 1.18 12/28/2018   BUN 34 (H) 12/28/2018   CO2 28 12/28/2018   TSH 0.48 12/28/2018   INR 1.7 (A) 12/12/2018   HGBA1C 6.0 02/20/2018    Dg Sacrum/coccyx  Result Date: 03/08/2018 CLINICAL DATA:  Fall.  Persistent pain. EXAM: SACRUM AND COCCYX - 2+ VIEW COMPARISON:  02/12/2018.  CT abdomen 08/29/2014. FINDINGS: Diffuse osteopenia. No evidence of acute fracture. No interim change from prior exam. Pelvic calcifications consistent phleboliths noted. Pelvic calcifications consistent with calcified fibroid again noted. IMPRESSION: Diffuse osteopenia. No evidence of acute fracture. No interim change from prior exam. Electronically Signed   By: Marcello Moores  Register   On: 03/08/2018 07:41    Dg Thoracic Spine W/swimmers  Result Date: 12/28/2018 CLINICAL DATA:  Mid back pain and lower back pain since a twisting injury 2 months ago. Left leg pain and tingling. EXAM: THORACIC SPINE - 3 VIEWS COMPARISON:  Chest x-ray dated 02/20/2018 and CT scan of the chest dated 07/04/2016 FINDINGS: There is an old healed compression fracture of  the superior aspect of T12, unchanged since 05/04/2016. The remainder of the thoracic spine appears normal. IMPRESSION: No acute abnormality of the thoracic spine. Old healed compression fracture of T12, stable. Electronically Signed   By: Lorriane Shire M.D.   On: 12/28/2018 11:14   Dg Lumbar Spine Complete  Result Date: 12/28/2018 CLINICAL DATA:  Mid and lower back pain since a twisting injury 2 months ago. Left leg pain and tingling. EXAM: LUMBAR SPINE - COMPLETE 4+ VIEW COMPARISON:  CT scan of the abdomen and pelvis dated 08/29/2014 FINDINGS: There is no evidence of a lumbar  spine fracture or bone destruction. Chronic disc space narrowing and disc degeneration at L2-3 and L3-4 and L5-S1. Lateral alignment is normal. Moderate bilateral facet arthritis at L4-5 and L5-S1. Minimal lumbar scoliosis with convexity to the right centered at L3. Old healed mild compression fracture of T12, stable since 2016. Aortic atherosclerosis. IMPRESSION: 1. No acute abnormality of the lumbar spine. 2. 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 3. Old healed compression fracture of T12. 4.  Aortic Atherosclerosis (ICD10-I70.0). Electronically Signed   By: Lorriane Shire M.D.   On: 12/28/2018 11:16    Assessment & Plan:   Floria was seen today for hypertension, hypothyroidism and back pain.  Diagnoses and all orders for this visit:  Acquired hypothyroidism- Her TSH is in the normal range.  She will stay on the current dose of levothyroxine. -     TSH; Future  Acute bilateral low back pain with left-sided sciatica- Plain films show an old vertebral fracture in T12 but no new fractures.  She is getting adequate control of the pain with Tylenol.  There is no evidence of radiculopathy or other complications. -     DG Thoracic Spine W/Swimmers; Future -     DG Lumbar Spine Complete; Future  Essential hypertension, benign- Her blood pressure is adequately well controlled. -     Magnesium; Future  -     Basic metabolic panel; Future  Chronic renal impairment, stage 3b- She agrees to avoid nephrotoxic agents. -     Magnesium; Future -     Basic metabolic panel; Future  Fracture of vertebra due to osteoporosis with routine healing, subsequent encounter  Age-related osteoporosis with current pathological fracture, initial encounter-I recommended that she start receiving Evenity injections. -     calcium-vitamin D (OSCAL WITH D) 500-200 MG-UNIT tablet; Take 1 tablet by mouth 2 (two) times daily.   I am having Alyssa [name] start on calcium-vitamin D. I am also having her maintain her acetaminophen, triamcinolone, clonazePAM, potassium chloride SA, furosemide, montelukast, azelastine, Viberzi, levothyroxine, and warfarin.  Meds ordered this encounter  Medications  . calcium-vitamin D (OSCAL WITH D) 500-200 MG-UNIT tablet    Sig: Take 1 tablet by mouth 2 (two) times daily.    Dispense:  180 tablet    Refill:  1     Follow-up: Return if symptoms worsen or fail to improve.  [name], MD

## 2018-12-28 NOTE — Telephone Encounter (Signed)
RX sent

## 2019-01-02 ENCOUNTER — Ambulatory Visit (INDEPENDENT_AMBULATORY_CARE_PROVIDER_SITE_OTHER): Payer: Medicare Other | Admitting: *Deleted

## 2019-01-02 ENCOUNTER — Other Ambulatory Visit: Payer: Self-pay

## 2019-01-02 DIAGNOSIS — I4891 Unspecified atrial fibrillation: Secondary | ICD-10-CM | POA: Diagnosis not present

## 2019-01-02 DIAGNOSIS — Z5181 Encounter for therapeutic drug level monitoring: Secondary | ICD-10-CM | POA: Diagnosis not present

## 2019-01-02 LAB — POCT INR: INR: 1.7 — AB (ref 2.0–3.0)

## 2019-01-02 NOTE — Patient Instructions (Signed)
Description   Take an extra 1/2 tablet today, then take 1 tablet every day except for 1/2 a tablet on Monday, Wednesday and, Friday. Recheck in 3 weeks. Continue eating greens about 2-3 times a week. Call with any new or different medications [phone]

## 2019-01-12 NOTE — Progress Notes (Signed)
Remote pacemaker transmission.   

## 2019-01-23 ENCOUNTER — Other Ambulatory Visit: Payer: Self-pay

## 2019-01-23 ENCOUNTER — Ambulatory Visit (INDEPENDENT_AMBULATORY_CARE_PROVIDER_SITE_OTHER): Payer: Medicare Other | Admitting: *Deleted

## 2019-01-23 DIAGNOSIS — Z5181 Encounter for therapeutic drug level monitoring: Secondary | ICD-10-CM | POA: Diagnosis not present

## 2019-01-23 DIAGNOSIS — I4891 Unspecified atrial fibrillation: Secondary | ICD-10-CM

## 2019-01-23 LAB — POCT INR: INR: 1.7 — AB (ref 2.0–3.0)

## 2019-01-23 NOTE — Patient Instructions (Addendum)
Description   Take an extra 1/2 tablet today, then take 1 tablet every day except for 1/2 a tablet on Mondays and Fridays. Recheck in 3 weeks.Call with any new or different medications [phone]

## 2019-01-24 DIAGNOSIS — H1132 Conjunctival hemorrhage, left eye: Secondary | ICD-10-CM | POA: Diagnosis not present

## 2019-01-24 DIAGNOSIS — H5213 Myopia, bilateral: Secondary | ICD-10-CM | POA: Diagnosis not present

## 2019-01-30 ENCOUNTER — Other Ambulatory Visit: Payer: Self-pay | Admitting: Internal Medicine

## 2019-01-30 ENCOUNTER — Telehealth: Payer: Self-pay

## 2019-01-30 DIAGNOSIS — K580 Irritable bowel syndrome with diarrhea: Secondary | ICD-10-CM

## 2019-01-30 NOTE — Telephone Encounter (Signed)
Key: [facility]

## 2019-02-03 ENCOUNTER — Other Ambulatory Visit: Payer: Self-pay | Admitting: Internal Medicine

## 2019-02-03 DIAGNOSIS — F411 Generalized anxiety disorder: Secondary | ICD-10-CM

## 2019-02-06 ENCOUNTER — Other Ambulatory Visit: Payer: Self-pay | Admitting: Internal Medicine

## 2019-02-06 DIAGNOSIS — K580 Irritable bowel syndrome with diarrhea: Secondary | ICD-10-CM

## 2019-02-06 MED ORDER — RIFAXIMIN 550 MG PO TABS: 550.0000 mg | ORAL_TABLET | Freq: Three times a day (TID) | ORAL | 0 refills | Status: AC

## 2019-02-13 ENCOUNTER — Ambulatory Visit (INDEPENDENT_AMBULATORY_CARE_PROVIDER_SITE_OTHER): Payer: Medicare Other | Admitting: *Deleted

## 2019-02-13 ENCOUNTER — Other Ambulatory Visit: Payer: Self-pay

## 2019-02-13 DIAGNOSIS — Z5181 Encounter for therapeutic drug level monitoring: Secondary | ICD-10-CM | POA: Diagnosis not present

## 2019-02-13 DIAGNOSIS — I4891 Unspecified atrial fibrillation: Secondary | ICD-10-CM

## 2019-02-13 LAB — POCT INR: INR: 2.1 (ref 2.0–3.0)

## 2019-02-13 NOTE — Patient Instructions (Signed)
Description   Continue to taake 1 tablet every day except for 1/2 a tablet on Mondays and Fridays. Recheck in 4 weeks.Call with any new or different medications [phone]

## 2019-02-24 ENCOUNTER — Other Ambulatory Visit: Payer: Self-pay | Admitting: Internal Medicine

## 2019-02-24 DIAGNOSIS — E038 Other specified hypothyroidism: Secondary | ICD-10-CM

## 2019-02-26 ENCOUNTER — Other Ambulatory Visit: Payer: Self-pay | Admitting: Internal Medicine

## 2019-02-26 ENCOUNTER — Ambulatory Visit: Payer: Medicare Other

## 2019-02-26 DIAGNOSIS — E038 Other specified hypothyroidism: Secondary | ICD-10-CM

## 2019-02-26 MED ORDER — LEVOTHYROXINE SODIUM 88 MCG PO TABS: 88.0000 ug | ORAL_TABLET | Freq: Every day | ORAL | 0 refills | Status: DC

## 2019-02-27 ENCOUNTER — Ambulatory Visit: Payer: Medicare Other | Attending: Internal Medicine

## 2019-02-27 DIAGNOSIS — Z23 Encounter for immunization: Secondary | ICD-10-CM | POA: Insufficient documentation

## 2019-02-27 NOTE — Progress Notes (Signed)
   Covid-19 Vaccination Clinic  Name:  VIA FANSLER    MRN: [id number] DOB: 1924-02-24  02/27/2019  Ms. Langill was observed post Covid-19 immunization for 15 minutes without incidence. She was provided with Vaccine Information Sheet and instruction to access the V-Safe system.   Ms. Mukherjee was instructed to call 911 with any severe reactions post vaccine: Marland Kitchen Difficulty breathing  . Swelling of your face and throat  . A fast heartbeat  . A bad rash all over your body  . Dizziness and weakness    Immunizations Administered    Name Date Dose VIS Date Route   Pfizer COVID-19 Vaccine 02/27/2019  2:14 PM 0.3 mL 01/19/2019 Intramuscular   Manufacturer: [employer]   Lot: [id number]   [place]: [id number]

## 2019-03-13 ENCOUNTER — Ambulatory Visit (INDEPENDENT_AMBULATORY_CARE_PROVIDER_SITE_OTHER): Payer: Medicare Other | Admitting: *Deleted

## 2019-03-13 ENCOUNTER — Other Ambulatory Visit: Payer: Self-pay

## 2019-03-13 DIAGNOSIS — I4891 Unspecified atrial fibrillation: Secondary | ICD-10-CM

## 2019-03-13 DIAGNOSIS — Z5181 Encounter for therapeutic drug level monitoring: Secondary | ICD-10-CM

## 2019-03-13 LAB — POCT INR: INR: 2.6 (ref 2.0–3.0)

## 2019-03-13 NOTE — Patient Instructions (Addendum)
Description   Continue to take 1 tablet every day except for 1/2 a tablet on Mondays and Fridays. Recheck in 5 weeks.Call with any new or different medications [phone]

## 2019-03-20 ENCOUNTER — Ambulatory Visit: Payer: Medicare Other | Attending: Internal Medicine

## 2019-03-20 ENCOUNTER — Ambulatory Visit: Payer: Medicare Other

## 2019-03-20 DIAGNOSIS — Z23 Encounter for immunization: Secondary | ICD-10-CM

## 2019-03-20 NOTE — Progress Notes (Signed)
   Covid-19 Vaccination Clinic  Name:  Alyssa [name]    MRN: [id number] DOB: 1924-10-09  03/20/2019  [name] was observed post Covid-19 immunization for 15 minutes without incidence. She was provided with Vaccine Information Sheet and instruction to access the V-Safe system.   [name] was instructed to call 911 with any severe reactions post vaccine: Marland Kitchen Difficulty breathing  . Swelling of your face and throat  . A fast heartbeat  . A bad rash all over your body  . Dizziness and weakness    Immunizations Administered    Name Date Dose VIS Date Route   Pfizer COVID-19 Vaccine 03/20/2019 12:49 PM 0.3 mL 01/19/2019 Intramuscular   Manufacturer: [place]   Lot: [id number]   [place]: [id number]

## 2019-03-22 ENCOUNTER — Ambulatory Visit (INDEPENDENT_AMBULATORY_CARE_PROVIDER_SITE_OTHER): Payer: Medicare Other | Admitting: *Deleted

## 2019-03-22 DIAGNOSIS — I480 Paroxysmal atrial fibrillation: Secondary | ICD-10-CM | POA: Diagnosis not present

## 2019-03-22 LAB — CUP PACEART REMOTE DEVICE CHECK
Battery Impedance: 496 Ohm
Battery Remaining Longevity: 83 mo
Battery Voltage: 2.79 V
Brady Statistic RV Percent Paced: 99 %
Date Time Interrogation Session: 20210211082451
Implantable Lead Implant Date: 20000921
Implantable Lead Implant Date: 20000921
Implantable Lead Location: 753859
Implantable Lead Location: 753860
Implantable Pulse Generator Implant Date: 20170118
Lead Channel Impedance Value: 577 Ohm
Lead Channel Impedance Value: 67 Ohm
Lead Channel Pacing Threshold Amplitude: 1 V
Lead Channel Pacing Threshold Pulse Width: 0.4 ms
Lead Channel Setting Pacing Amplitude: 2.5 V
Lead Channel Setting Pacing Pulse Width: 0.4 ms
Lead Channel Setting Sensing Sensitivity: 2 mV

## 2019-03-22 NOTE — Progress Notes (Signed)
PPM Remote  

## 2019-03-26 ENCOUNTER — Other Ambulatory Visit: Payer: Self-pay | Admitting: Internal Medicine

## 2019-03-26 DIAGNOSIS — E038 Other specified hypothyroidism: Secondary | ICD-10-CM

## 2019-03-26 MED ORDER — LEVOTHYROXINE SODIUM 88 MCG PO TABS: 88.0000 ug | ORAL_TABLET | Freq: Every day | ORAL | 0 refills | Status: DC

## 2019-04-11 DIAGNOSIS — M5136 Other intervertebral disc degeneration, lumbar region: Secondary | ICD-10-CM | POA: Diagnosis not present

## 2019-04-11 DIAGNOSIS — M9903 Segmental and somatic dysfunction of lumbar region: Secondary | ICD-10-CM | POA: Diagnosis not present

## 2019-04-11 DIAGNOSIS — M62830 Muscle spasm of back: Secondary | ICD-10-CM | POA: Diagnosis not present

## 2019-04-11 DIAGNOSIS — M545 Low back pain: Secondary | ICD-10-CM | POA: Diagnosis not present

## 2019-04-17 ENCOUNTER — Ambulatory Visit (INDEPENDENT_AMBULATORY_CARE_PROVIDER_SITE_OTHER): Payer: Medicare Other | Admitting: *Deleted

## 2019-04-17 ENCOUNTER — Other Ambulatory Visit: Payer: Self-pay

## 2019-04-17 DIAGNOSIS — Z5181 Encounter for therapeutic drug level monitoring: Secondary | ICD-10-CM | POA: Diagnosis not present

## 2019-04-17 DIAGNOSIS — I4891 Unspecified atrial fibrillation: Secondary | ICD-10-CM | POA: Diagnosis not present

## 2019-04-17 LAB — POCT INR: INR: 1.9 — AB (ref 2.0–3.0)

## 2019-04-17 NOTE — Patient Instructions (Signed)
Description   Today take an extra 1/2 tablet when you get home then continue to take 1 tablet every day except for 1/2 tablet on Mondays and Fridays. Recheck in 5 weeks. Call with any new or different medications [phone]

## 2019-04-18 DIAGNOSIS — L814 Other melanin hyperpigmentation: Secondary | ICD-10-CM | POA: Diagnosis not present

## 2019-04-18 DIAGNOSIS — Z85828 Personal history of other malignant neoplasm of skin: Secondary | ICD-10-CM | POA: Diagnosis not present

## 2019-04-18 DIAGNOSIS — L570 Actinic keratosis: Secondary | ICD-10-CM | POA: Diagnosis not present

## 2019-05-01 ENCOUNTER — Telehealth: Payer: Self-pay | Admitting: Internal Medicine

## 2019-05-01 NOTE — Telephone Encounter (Signed)
New message   Pt c/o medication issue:  1. Name of Medication:potassium chloride SA (KLOR-CON M20) 20 MEQ tablet(Expired)  2. How are you currently taking this medication (dosage and times per day)? As written  3. Are you having a reaction (difficulty breathing--STAT)?no   4. What is your medication issue? Patient has some questions about this medication. Please call.

## 2019-05-02 NOTE — Telephone Encounter (Signed)
Attempted phone call to pt.  Left voicemail message for pt to contact RN at [phone]. 

## 2019-05-09 ENCOUNTER — Ambulatory Visit (INDEPENDENT_AMBULATORY_CARE_PROVIDER_SITE_OTHER): Payer: Medicare Other

## 2019-05-09 ENCOUNTER — Other Ambulatory Visit: Payer: Self-pay

## 2019-05-09 ENCOUNTER — Encounter: Payer: Self-pay | Admitting: Internal Medicine

## 2019-05-09 ENCOUNTER — Ambulatory Visit (INDEPENDENT_AMBULATORY_CARE_PROVIDER_SITE_OTHER): Payer: Medicare Other | Admitting: Internal Medicine

## 2019-05-09 VITALS — BP 148/80 | HR 68 | Temp 97.8°F | Ht 66.5 in | Wt 127.0 lb

## 2019-05-09 DIAGNOSIS — G8929 Other chronic pain: Secondary | ICD-10-CM

## 2019-05-09 DIAGNOSIS — M5136 Other intervertebral disc degeneration, lumbar region: Secondary | ICD-10-CM | POA: Diagnosis not present

## 2019-05-09 DIAGNOSIS — I071 Rheumatic tricuspid insufficiency: Secondary | ICD-10-CM | POA: Diagnosis not present

## 2019-05-09 DIAGNOSIS — Z298 Encounter for other specified prophylactic measures: Secondary | ICD-10-CM

## 2019-05-09 DIAGNOSIS — M545 Low back pain, unspecified: Secondary | ICD-10-CM

## 2019-05-09 DIAGNOSIS — M8008XG Age-related osteoporosis with current pathological fracture, vertebra(e), subsequent encounter for fracture with delayed healing: Secondary | ICD-10-CM | POA: Diagnosis not present

## 2019-05-09 DIAGNOSIS — M48061 Spinal stenosis, lumbar region without neurogenic claudication: Secondary | ICD-10-CM | POA: Diagnosis not present

## 2019-05-09 MED ORDER — AZITHROMYCIN 500 MG PO TABS: 500.0000 mg | ORAL_TABLET | Freq: Once | ORAL | 0 refills | Status: AC

## 2019-05-09 NOTE — Patient Instructions (Signed)

## 2019-05-09 NOTE — Progress Notes (Signed)
Subjective:  Patient ID: Alyssa [name], female    DOB: 1924-10-02  Age: 86 y.o. MRN: [id number]  CC: Back Pain  This visit occurred during the SARS-CoV-2 public health emergency.  Safety protocols were in place, including screening questions prior to the visit, additional usage of staff PPE, and extensive cleaning of exam room while observing appropriate contact time as indicated for disinfecting solutions.    HPI Alyssa [name] presents for f/up - She continues to complain of intermittent low back pain more on the right than the left.  The back pain does not radiate into her extremities and she denies paresthesias.  She is getting adequate symptom relief with her current medications.  She wants to check a x-ray of the lower back again to see if anything is changed.  She is undergoing a dental procedure soon and wants to take an antibiotic prophylaxis in light of her valvular heart disease.  She tells me she does not tolerate amoxicillin very well.  Outpatient Medications Prior to Visit  Medication Sig Dispense Refill  . acetaminophen (TYLENOL ARTHRITIS PAIN) 650 MG CR tablet Take 1,300 mg by mouth 2 (two) times daily.      . calcium-vitamin D (OSCAL WITH D) 500-200 MG-UNIT tablet Take 1 tablet by mouth 2 (two) times daily. 180 tablet 1  . clonazePAM (KLONOPIN) 0.5 MG tablet Take 1 tablet by mouth twice daily as needed for anxiety 180 tablet 1  . Eluxadoline (VIBERZI) 75 MG TABS Take 1 tablet by mouth daily. 56 tablet 0  . furosemide (LASIX) 40 MG tablet Take 1 tablet (40 mg total) by mouth daily. 90 tablet 3  . levothyroxine (EUTHYROX) 88 MCG tablet Take 1 tablet (88 mcg total) by mouth daily before breakfast. 90 tablet 0  . warfarin (COUMADIN) 3 MG tablet TAKE AS DIRECTED BY COUMADIN CLINIC 75 tablet 0  . azelastine (ASTELIN) 0.1 % nasal spray Place 1 spray into both nostrils 2 (two) times daily. Use in each nostril as directed (Patient not taking: Reported on 05/09/2019) 90 mL 1  .  montelukast (SINGULAIR) 10 MG tablet Take 1 tablet (10 mg total) by mouth at bedtime. (Patient not taking: Reported on 05/09/2019) 90 tablet 1  . potassium chloride SA (KLOR-CON M20) 20 MEQ tablet Take 1 tablet (20 mEq total) by mouth daily. 90 tablet 3  . triamcinolone (NASACORT) 55 MCG/ACT AERO nasal inhaler Place 2 sprays into the nose daily. (Patient not taking: Reported on 05/09/2019) 32.4 mL 1   No facility-administered medications prior to visit.    ROS Review of Systems  Constitutional: Negative.  Negative for chills, fatigue and fever.  HENT: Negative.   Eyes: Negative.   Respiratory: Negative.  Negative for cough, chest tightness and wheezing.   Cardiovascular: Negative for chest pain, palpitations and leg swelling.  Gastrointestinal: Negative for abdominal pain, constipation, diarrhea, nausea and vomiting.  Endocrine: Negative.   Genitourinary: Negative.  Negative for difficulty urinating and hematuria.  Musculoskeletal: Positive for back pain. Negative for arthralgias and neck stiffness.  Skin: Negative.   Neurological: Negative.  Negative for dizziness, weakness and light-headedness.  Hematological: Negative for adenopathy. Does not bruise/bleed easily.  Psychiatric/Behavioral: Negative.     Objective:  BP (!) 148/80 (BP Location: Right Arm, Patient Position: Sitting, Cuff Size: Normal)   Pulse 68   Temp 97.8 F (36.6 C) (Oral)   Ht 5' 6.5" (1.689 m)   Wt 127 lb (57.6 kg)   SpO2 99%   BMI 20.19 kg/m  BP Readings from Last 3 Encounters:  05/09/19 (!) 148/80  12/28/18 (!) 160/82  09/25/18 122/66    Wt Readings from Last 3 Encounters:  05/09/19 127 lb (57.6 kg)  12/28/18 121 lb 12 oz (55.2 kg)  09/25/18 121 lb (54.9 kg)    Physical Exam Vitals reviewed.  HENT:     Nose: Nose normal.     Mouth/Throat:     Mouth: Mucous membranes are moist.  Eyes:     General: No scleral icterus.    Conjunctiva/sclera: Conjunctivae normal.  Cardiovascular:     Rate and  Rhythm: Normal rate and regular rhythm.     Heart sounds: Murmur present.  Pulmonary:     Effort: Pulmonary effort is normal.     Breath sounds: No stridor. No wheezing, rhonchi or rales.  Abdominal:     General: Abdomen is flat.     Palpations: There is no mass.     Tenderness: There is no abdominal tenderness. There is no guarding.  Musculoskeletal:        General: Normal range of motion.     Cervical back: Normal and neck supple.     Thoracic back: Normal.     Lumbar back: Normal. No swelling, deformity, signs of trauma or tenderness. Normal range of motion. Negative right straight leg raise test and negative left straight leg raise test.  Lymphadenopathy:     Cervical: No cervical adenopathy.  Skin:    General: Skin is warm and dry.  Neurological:     General: No focal deficit present.     Mental Status: She is alert.     Motor: Motor function is intact. No weakness.     Coordination: Coordination is intact.     Gait: Gait is intact.     Deep Tendon Reflexes: Reflexes normal.  Psychiatric:        Mood and Affect: Mood normal.        Behavior: Behavior normal.     Lab Results  Component Value Date   WBC 5.6 09/25/2018   HGB 12.4 09/25/2018   HCT 37.3 09/25/2018   PLT 193.0 09/25/2018   GLUCOSE 77 12/28/2018   CHOL 155 02/11/2014   TRIG 84.0 02/11/2014   HDL 46.10 02/11/2014   LDLCALC 92 02/11/2014   ALT 19 02/21/2018   AST 23 02/21/2018   NA 142 12/28/2018   K 4.6 12/28/2018   CL 105 12/28/2018   CREATININE 1.18 12/28/2018   BUN 34 (H) 12/28/2018   CO2 28 12/28/2018   TSH 0.48 12/28/2018   INR 1.9 (A) 04/17/2019   HGBA1C 6.0 02/20/2018    DG Thoracic Spine W/Swimmers  Result Date: 12/28/2018 CLINICAL DATA:  Mid back pain and lower back pain since a twisting injury 2 months ago. Left leg pain and tingling. EXAM: THORACIC SPINE - 3 VIEWS COMPARISON:  Chest x-ray dated 02/20/2018 and CT scan of the chest dated 07/04/2016 FINDINGS: There is an old healed  compression fracture of the superior aspect of T12, unchanged since 05/04/2016. The remainder of the thoracic spine appears normal. IMPRESSION: No acute abnormality of the thoracic spine. Old healed compression fracture of T12, stable. Electronically Signed   By: Lorriane Shire M.D.   On: 12/28/2018 11:14   DG Lumbar Spine Complete  Result Date: 12/28/2018 CLINICAL DATA:  Mid and lower back pain since a twisting injury 2 months ago. Left leg pain and tingling. EXAM: LUMBAR SPINE - COMPLETE 4+ VIEW COMPARISON:  CT scan of the abdomen  and pelvis dated 08/29/2014 FINDINGS: There is no evidence of a lumbar spine fracture or bone destruction. Chronic disc space narrowing and disc degeneration at L2-3 and L3-4 and L5-S1. Lateral alignment is normal. Moderate bilateral facet arthritis at L4-5 and L5-S1. Minimal lumbar scoliosis with convexity to the right centered at L3. Old healed mild compression fracture of T12, stable since 2016. Aortic atherosclerosis. IMPRESSION: 1. No acute abnormality of the lumbar spine. 2. 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 3. Old healed compression fracture of T12. 4.  Aortic Atherosclerosis (ICD10-I70.0). Electronically Signed   By: Lorriane Shire M.D.   On: 12/28/2018 11:16   FINDINGS: Mild curvature of the lumbar spine is convex towards the right.  There is chronic compression deformity involving the T12 vertebra, unchanged from previous exam.  No lumbar spine fracture. Multi level disc space narrowing and endplate spurring is identified throughout the lumbar spine. Bilateral facet arthropathy noted at L4-5 and L5-S1.  IMPRESSION: 1. No acute abnormality. 2. Multilevel degenerative disc disease in facet degenerative change. 3. Chronic T12 compression deformity.   Electronically Signed   By: Kerby Moors M.D.   On: 05/09/2019 16:30   Assessment & Plan:   Mely was seen today for back pain.  Diagnoses and all orders for this  visit:  Fracture of vertebra due to osteoporosis with delayed healing, subsequent encounter -     DG Lumbar Spine Complete; Future  Chronic right-sided low back pain without sciatica- Based on her symptoms, exam, and x-rays there is no evidence of changes or deterioration in her low back condition.  She will continue Tylenol as needed. -     DG Lumbar Spine Complete; Future  Severe tricuspid regurgitation -     azithromycin (ZITHROMAX) 500 MG tablet; Take 1 tablet (500 mg total) by mouth once for 1 dose. Take one hour prior to dental proceudre  Indication present for endocarditis prophylaxis -     azithromycin (ZITHROMAX) 500 MG tablet; Take 1 tablet (500 mg total) by mouth once for 1 dose. Take one hour prior to dental proceudre   I have discontinued [place] Cerise T. Schmuck's warfarin. I am also having her start on azithromycin. Additionally, I am having her maintain her acetaminophen, triamcinolone, potassium chloride SA, furosemide, montelukast, azelastine, Viberzi, calcium-vitamin D, clonazePAM, and levothyroxine.  Meds ordered this encounter  Medications  . azithromycin (ZITHROMAX) 500 MG tablet    Sig: Take 1 tablet (500 mg total) by mouth once for 1 dose. Take one hour prior to dental proceudre    Dispense:  3 tablet    Refill:  0     Follow-up: Return in about 3 months (around 08/08/2019).  [name], MD

## 2019-05-11 NOTE — Telephone Encounter (Signed)
Attempted phone call to pt.  Left voicemail message for pt to contact RN at [phone]. 

## 2019-05-14 ENCOUNTER — Telehealth: Payer: Self-pay | Admitting: Internal Medicine

## 2019-05-14 ENCOUNTER — Encounter: Payer: Self-pay | Admitting: Internal Medicine

## 2019-05-14 NOTE — Telephone Encounter (Signed)
Error

## 2019-05-14 NOTE — Telephone Encounter (Signed)
New Message:   Pt is calling and states that [facility] will not fill her antibiotic due to her being on Coumadin. She states that Dr. Caryl Comes told her to stop her coumadin but since she last had coumadin with [place] on 05/09/19. But the patient is still needing the antibiotic before she gets her tooth pulled on 05/16/19. Please advise.

## 2019-05-14 NOTE — Telephone Encounter (Signed)
There is a concern with pt taking coumadin and an abx, but I do not see either on her list. Am I missing it?

## 2019-05-14 NOTE — Telephone Encounter (Signed)
Pt called our office today in regards to a having 1 tooth pulled Wed 05/16/19 by Dr. Ruthell Rummage with [facility] ph# [phone]. Pt states Dr. [name] (PCP) told her to stop her coumadin when she saw him last week 05/09/19 and that he was going to send in an antibiotic for her to have the dental work. Pt states the pharmacy denied the antibiotic that Dr. [name] called in for her. I first confirmed with the pt that her INR is followed her by our office, pt answered yes. I left a message with the dental office to please call [phone] to discuss dental work to be done. I then s/w our PharmD Lenna Sciara who after careful review of pt's chart stated the pt will not need to hold her coumadin for 1 tooth to be pulled, nor does she require SBE prior to dental work. I then called the pt back and advised her of PharmD recommendations she does not need SBE nor will she need to hold her coumadin for 1 tooth to be pulled. I did also confirm with the pt when she last took her coumadin, pt answered today. Pt thanked me for all of our help in this matter. I will forward my note to [facility]. PharmD for review tomorrow as it is after 5 pm today. Once notes have been reviewed by PharmD for accuracy of recommendations I will fax notes of clearance for pt to have her tooth pulled Wed 05/15/19. I will need to call dental office in the morning for the fax number.

## 2019-05-14 NOTE — Telephone Encounter (Signed)
New Message    Pt is calling and says she is having her tooth extracted on Wednesday.She says she is suppose to take an antibiotic before the procedure   Pt says Dr [name] sent a prescription but [facility] will not fill the medication  Pt also says Dr [name] took her off of the coumadin   Please advise

## 2019-05-15 NOTE — Telephone Encounter (Signed)
I called Dr. Ruthell Rummage office/[facility] and s/w Missy. I asked Missy for their fax number so that I may fax note over to Dr. Ruthell Rummage. Fax number is [phone]. I will fax note today.    I left message for the pt to let her know note will be sent to Dr. Ruthell Rummage office for her dental work. Left message no need for her to call back left message as FYI for her.

## 2019-05-15 NOTE — Telephone Encounter (Signed)
Pharmacy Engineer, building services on [address]) contacted and informed PCP was always okay with this since it is a very short course.   Pt contacted and informed that [facility] should be getting the rx ready for pick up.

## 2019-05-15 NOTE — Telephone Encounter (Signed)
I have reviewed recommendation and this is accurate. No need to hold coumadin for a 1 simple tooth extraction. After review of patients chart, SBE prophylaxis is not indicated in this patient.

## 2019-05-17 ENCOUNTER — Telehealth: Payer: Self-pay | Admitting: Internal Medicine

## 2019-05-17 NOTE — Telephone Encounter (Signed)
Pt was supposed to continue her warfarin for single extraction. She should continue taking this.

## 2019-05-17 NOTE — Telephone Encounter (Signed)
   Pt called, she would like to know when can she continue her coumadin. She got her tooth extracted yesterday.  Please advise

## 2019-05-17 NOTE — Telephone Encounter (Signed)
Alyssa [name] [facility]  Pharmacist  Specialty:  Pharmacist  Telephone Encounter     Signed  Encounter Date:  05/17/2019          Signed        -- Show:Clear all -Manual-Template-Copied  Added by: Alyssa [name], Murray[place]over for details- Pt was supposed to continue her warfarin for single extraction. She should continue taking this.         I s/w the pt about her coumadin. I asked her did she stop her coumadin for her tooth extraction, I stated to the pt the notes from her call we though maybe she had stopped her coumadin.          Pt answered no she did not stop her coumadin. Pt states the DDS                 advised pt to check with our office to make sure ok to take her                        coumadin today. I assured the pt per Pharm-D Alyssa [name] ok to take         her coumadin today. Pt thanked me for the call and the help.           I will send FYI to our Pharm-D.

## 2019-05-18 ENCOUNTER — Telehealth: Payer: Self-pay | Admitting: Internal Medicine

## 2019-05-18 NOTE — Telephone Encounter (Signed)
° °  [employer] requesting additional clinical for Prolia  Phone [phone] option 5

## 2019-05-22 ENCOUNTER — Ambulatory Visit (INDEPENDENT_AMBULATORY_CARE_PROVIDER_SITE_OTHER): Payer: Medicare Other | Admitting: *Deleted

## 2019-05-22 ENCOUNTER — Other Ambulatory Visit: Payer: Self-pay

## 2019-05-22 DIAGNOSIS — I4891 Unspecified atrial fibrillation: Secondary | ICD-10-CM

## 2019-05-22 DIAGNOSIS — Z5181 Encounter for therapeutic drug level monitoring: Secondary | ICD-10-CM | POA: Diagnosis not present

## 2019-05-22 LAB — POCT INR: INR: 2.4 (ref 2.0–3.0)

## 2019-05-22 NOTE — Patient Instructions (Signed)
Description   Continue to take 1 tablet every day except for 1/2 tablet on Mondays and Fridays. Recheck in 6 weeks. Call with any new or different medications #[phone]      

## 2019-05-25 NOTE — Telephone Encounter (Signed)
Appeal form has been faxed.

## 2019-05-26 ENCOUNTER — Other Ambulatory Visit: Payer: Self-pay | Admitting: Internal Medicine

## 2019-05-31 NOTE — Telephone Encounter (Signed)
    [employer] Appeals calling with Josem Kaufmann for Josephine Cables # [id number] Dates approved 05/18/19--> 05/29/20

## 2019-06-07 NOTE — Telephone Encounter (Signed)
Spoke with pt who states she had some previous swelling in her left leg bit :it has straightened itself out" and she is feeling "good"  Pt has an appointment next week with her PCP for regular f/u.  Pt with no further questions or concerns at this time.

## 2019-06-12 ENCOUNTER — Ambulatory Visit (INDEPENDENT_AMBULATORY_CARE_PROVIDER_SITE_OTHER): Payer: Medicare Other

## 2019-06-12 ENCOUNTER — Encounter: Payer: Self-pay | Admitting: Internal Medicine

## 2019-06-12 ENCOUNTER — Other Ambulatory Visit: Payer: Self-pay

## 2019-06-12 ENCOUNTER — Ambulatory Visit (INDEPENDENT_AMBULATORY_CARE_PROVIDER_SITE_OTHER): Payer: Medicare Other | Admitting: Internal Medicine

## 2019-06-12 VITALS — BP 118/60 | HR 68 | Temp 98.3°F | Resp 16 | Ht 67.0 in | Wt 122.8 lb

## 2019-06-12 VITALS — BP 146/74 | HR 68 | Temp 98.3°F | Resp 16 | Ht 66.5 in | Wt 122.0 lb

## 2019-06-12 DIAGNOSIS — N1832 Chronic kidney disease, stage 3b: Secondary | ICD-10-CM | POA: Diagnosis not present

## 2019-06-12 DIAGNOSIS — E039 Hypothyroidism, unspecified: Secondary | ICD-10-CM | POA: Diagnosis not present

## 2019-06-12 DIAGNOSIS — Z0000 Encounter for general adult medical examination without abnormal findings: Secondary | ICD-10-CM | POA: Diagnosis not present

## 2019-06-12 DIAGNOSIS — R739 Hyperglycemia, unspecified: Secondary | ICD-10-CM | POA: Diagnosis not present

## 2019-06-12 DIAGNOSIS — N2889 Other specified disorders of kidney and ureter: Secondary | ICD-10-CM

## 2019-06-12 DIAGNOSIS — Z23 Encounter for immunization: Secondary | ICD-10-CM

## 2019-06-12 DIAGNOSIS — I10 Essential (primary) hypertension: Secondary | ICD-10-CM | POA: Diagnosis not present

## 2019-06-12 LAB — URINALYSIS, ROUTINE W REFLEX MICROSCOPIC
Bilirubin Urine: NEGATIVE
Hgb urine dipstick: NEGATIVE
Ketones, ur: NEGATIVE
Leukocytes,Ua: NEGATIVE
Nitrite: NEGATIVE
RBC / HPF: NONE SEEN (ref 0–?)
Specific Gravity, Urine: 1.02 (ref 1.000–1.030)
Total Protein, Urine: NEGATIVE
Urine Glucose: NEGATIVE
Urobilinogen, UA: 0.2 (ref 0.0–1.0)
pH: 5.5 (ref 5.0–8.0)

## 2019-06-12 LAB — CBC WITH DIFFERENTIAL/PLATELET
Basophils Absolute: 0 10*3/uL (ref 0.0–0.1)
Basophils Relative: 0.8 % (ref 0.0–3.0)
Eosinophils Absolute: 0.1 10*3/uL (ref 0.0–0.7)
Eosinophils Relative: 1.9 % (ref 0.0–5.0)
HCT: 36.5 % (ref 36.0–46.0)
Hemoglobin: 12.2 g/dL (ref 12.0–15.0)
Lymphocytes Relative: 30.9 % (ref 12.0–46.0)
Lymphs Abs: 1.3 10*3/uL (ref 0.7–4.0)
MCHC: 33.3 g/dL (ref 30.0–36.0)
MCV: 92.5 fl (ref 78.0–100.0)
Monocytes Absolute: 0.4 10*3/uL (ref 0.1–1.0)
Monocytes Relative: 8.9 % (ref 3.0–12.0)
Neutro Abs: 2.4 10*3/uL (ref 1.4–7.7)
Neutrophils Relative %: 57.5 % (ref 43.0–77.0)
Platelets: 166 10*3/uL (ref 150.0–400.0)
RBC: 3.95 Mil/uL (ref 3.87–5.11)
RDW: 12.6 % (ref 11.5–15.5)
WBC: 4.2 10*3/uL (ref 4.0–10.5)

## 2019-06-12 LAB — TSH: TSH: 0.24 u[IU]/mL — ABNORMAL LOW (ref 0.35–4.50)

## 2019-06-12 LAB — BASIC METABOLIC PANEL
BUN: 47 mg/dL — ABNORMAL HIGH (ref 6–23)
CO2: 30 mEq/L (ref 19–32)
Calcium: 8.9 mg/dL (ref 8.4–10.5)
Chloride: 104 mEq/L (ref 96–112)
Creatinine, Ser: 1.38 mg/dL — ABNORMAL HIGH (ref 0.40–1.20)
GFR: 35.67 mL/min — ABNORMAL LOW (ref >60.00)
Glucose, Bld: 95 mg/dL (ref 70–99)
Potassium: 5 mEq/L (ref 3.5–5.1)
Sodium: 139 mEq/L (ref 135–145)

## 2019-06-12 LAB — HEMOGLOBIN A1C: Hgb A1c MFr Bld: 5.7 % (ref 4.6–6.5)

## 2019-06-12 NOTE — Patient Instructions (Addendum)
Alyssa [name] , Thank you for taking time to come for your Medicare Wellness Visit. I appreciate your ongoing commitment to your health goals. Please review the following plan we discussed and let me know if I can assist you in the future.   Screening recommendations/referrals: Colorectal Screening: 03/16/2016 Mammogram: 06/09/2012 Bone Density: 06/09/2012  Vision and Dental Exams: Recommended annual ophthalmology exams for early detection of glaucoma and other disorders of the eye Recommended annual dental exams for proper oral hygiene  Vaccinations: Influenza vaccine: 10/19/2018 Pneumococcal vaccine: completed; Prevnar13 02/11/2014, Pneumovax23 04/23/2010 Tdap vaccine: 02/12/2018; due every 10 years Shingles vaccine: Please call your insurance company to determine your out of pocket expense for the Shingrix vaccine. You may receive this vaccine at your local pharmacy. Covid vaccine: [employer] completed  Advanced directives: Advance directives discussed with you today. Please bring a copy of your POA (Power of [place]) and/or Living Will to your next appointment.  Goals:  Recommend to drink at least 6-8 8oz glasses of water per day.  Recommend to exercise for at least 150 minutes per week.  Recommend to remove any items from the home that may cause slips or trips.  Recommend to decrease portion sizes by eating 3 small healthy meals and at least 2 healthy snacks per day.  Recommend to begin DASH diet as directed below  Recommend to continue efforts to reduce smoking habits until no longer smoking. Smoking Cessation literature is attached below.  Next appointment: Please schedule your Annual Wellness Visit with your Nurse Health Advisor in one year.  Preventive Care 54 Years and Older, Female Preventive care refers to lifestyle choices and visits with your health care provider that can promote health and wellness. What does preventive care include?  A yearly physical exam. This is also called  an annual well check.  Dental exams once or twice a year.  Routine eye exams. Ask your health care provider how often you should have your eyes checked.  Personal lifestyle choices, including:  Daily care of your teeth and gums.  Regular physical activity.  Eating a healthy diet.  Avoiding tobacco and drug use.  Limiting alcohol use.  Practicing safe sex.  Taking low-dose aspirin every day if recommended by your health care provider.  Taking vitamin and mineral supplements as recommended by your health care provider. What happens during an annual well check? The services and screenings done by your health care provider during your annual well check will depend on your age, overall health, lifestyle risk factors, and family history of disease. Counseling  Your health care provider may ask you questions about your:  Alcohol use.  Tobacco use.  Drug use.  Emotional well-being.  Home and relationship well-being.  Sexual activity.  Eating habits.  History of falls.  Memory and ability to understand (cognition).  Work and work Statistician.  Reproductive health. Screening  You may have the following tests or measurements:  Height, weight, and BMI.  Blood pressure.  Lipid and cholesterol levels. These may be checked every 5 years, or more frequently if you are over 83 years old.  Skin check.  Lung cancer screening. You may have this screening every year starting at age 34 if you have a 30-pack-year history of smoking and currently smoke or have quit within the past 15 years.  Fecal occult blood test (FOBT) of the stool. You may have this test every year starting at age 75.  Flexible sigmoidoscopy or colonoscopy. You may have a sigmoidoscopy every 5 years or  a colonoscopy every 10 years starting at age 44.  Hepatitis C blood test.  Hepatitis B blood test.  Sexually transmitted disease (STD) testing.  Diabetes screening. This is done by checking your  blood sugar (glucose) after you have not eaten for a while (fasting). You may have this done every 1-3 years.  Bone density scan. This is done to screen for osteoporosis. You may have this done starting at age 79.  Mammogram. This may be done every 1-2 years. Talk to your health care provider about how often you should have regular mammograms. Talk with your health care provider about your test results, treatment options, and if necessary, the need for more tests. Vaccines  Your health care provider may recommend certain vaccines, such as:  Influenza vaccine. This is recommended every year.  Tetanus, diphtheria, and acellular pertussis (Tdap, Td) vaccine. You may need a Td booster every 10 years.  Zoster vaccine. You may need this after age 64.  Pneumococcal 13-valent conjugate (PCV13) vaccine. One dose is recommended after age 39.  Pneumococcal polysaccharide (PPSV23) vaccine. One dose is recommended after age 102. Talk to your health care provider about which screenings and vaccines you need and how often you need them. This information is not intended to replace advice given to you by your health care provider. Make sure you discuss any questions you have with your health care provider. Document Released: 02/21/2015 Document Revised: 10/15/2015 Document Reviewed: 11/26/2014 Elsevier Interactive Patient Education  2017 [place] Prevention in the Home Falls can cause injuries. They can happen to people of all ages. There are many things you can do to make your home safe and to help prevent falls. What can I do on the outside of my home?  Regularly fix the edges of walkways and driveways and fix any cracks.  Remove anything that might make you trip as you walk through a door, such as a raised step or threshold.  Trim any bushes or trees on the path to your home.  Use bright outdoor lighting.  Clear any walking paths of anything that might make someone trip, such as rocks  or tools.  Regularly check to see if handrails are loose or broken. Make sure that both sides of any steps have handrails.  Any raised decks and porches should have guardrails on the edges.  Have any leaves, snow, or ice cleared regularly.  Use sand or salt on walking paths during winter.  Clean up any spills in your garage right away. This includes oil or grease spills. What can I do in the bathroom?  Use night lights.  Install grab bars by the toilet and in the tub and shower. Do not use towel bars as grab bars.  Use non-skid mats or decals in the tub or shower.  If you need to sit down in the shower, use a plastic, non-slip stool.  Keep the floor dry. Clean up any water that spills on the floor as soon as it happens.  Remove soap buildup in the tub or shower regularly.  Attach bath mats securely with double-sided non-slip rug tape.  Do not have throw rugs and other things on the floor that can make you trip. What can I do in the bedroom?  Use night lights.  Make sure that you have a light by your bed that is easy to reach.  Do not use any sheets or blankets that are too big for your bed. They should not hang down onto  the floor.  Have a firm chair that has side arms. You can use this for support while you get dressed.  Do not have throw rugs and other things on the floor that can make you trip. What can I do in the kitchen?  Clean up any spills right away.  Avoid walking on wet floors.  Keep items that you use a lot in easy-to-reach places.  If you need to reach something above you, use a strong step stool that has a grab bar.  Keep electrical cords out of the way.  Do not use floor polish or wax that makes floors slippery. If you must use wax, use non-skid floor wax.  Do not have throw rugs and other things on the floor that can make you trip. What can I do with my stairs?  Do not leave any items on the stairs.  Make sure that there are handrails on both  sides of the stairs and use them. Fix handrails that are broken or loose. Make sure that handrails are as long as the stairways.  Check any carpeting to make sure that it is firmly attached to the stairs. Fix any carpet that is loose or worn.  Avoid having throw rugs at the top or bottom of the stairs. If you do have throw rugs, attach them to the floor with carpet tape.  Make sure that you have a light switch at the top of the stairs and the bottom of the stairs. If you do not have them, ask someone to add them for you. What else can I do to help prevent falls?  Wear shoes that:  Do not have high heels.  Have rubber bottoms.  Are comfortable and fit you well.  Are closed at the toe. Do not wear sandals.  If you use a stepladder:  Make sure that it is fully opened. Do not climb a closed stepladder.  Make sure that both sides of the stepladder are locked into place.  Ask someone to hold it for you, if possible.  Clearly mark and make sure that you can see:  Any grab bars or handrails.  First and last steps.  Where the edge of each step is.  Use tools that help you move around (mobility aids) if they are needed. These include:  Canes.  Walkers.  Scooters.  Crutches.  Turn on the lights when you go into a dark area. Replace any light bulbs as soon as they burn out.  Set up your furniture so you have a clear path. Avoid moving your furniture around.  If any of your floors are uneven, fix them.  If there are any pets around you, be aware of where they are.  Review your medicines with your doctor. Some medicines can make you feel dizzy. This can increase your chance of falling. Ask your doctor what other things that you can do to help prevent falls. This information is not intended to replace advice given to you by your health care provider. Make sure you discuss any questions you have with your health care provider. Document Released: 11/21/2008 Document Revised:  07/03/2015 Document Reviewed: 03/01/2014 Elsevier Interactive Patient Education  2017 [employer].

## 2019-06-12 NOTE — Progress Notes (Addendum)
Subjective:   Alyssa [name] is a 86 y.o. female who presents for Medicare Annual (Subsequent) preventive examination.  Review of Systems:  No ROS. Medicare Wellness Visit. Additional risk factors are reflected in social history. Cardiac Risk Factors include: advanced age (>69men, >52 women);hypertension;family history of premature cardiovascular disease  Sleep Patterns: No sleep issues, feels rested on waking and sleeps 8 hours nightly. Home Safety/Smoke Alarms: Feels safe in home; uses home alarm. Smoke alarms in place. Living environment: Widowed, lives in a 3rd floor condo; no needs for DME, good support system. Seat Belt Safety/Bike Helmet: Wears seat belt.    Objective:     Vitals: BP 118/60 (BP Location: Left Arm, Patient Position: Sitting, Cuff Size: Small)   Pulse 68   Temp 98.3 F (36.8 C)   Resp 16   Ht 5\' 7"  (1.702 m)   Wt 122 lb 12.8 oz (55.7 kg)   SpO2 96%   BMI 19.23 kg/m   Body mass index is 19.23 kg/m.  Advanced Directives 06/12/2019 02/20/2018 02/20/2018 02/12/2018 03/16/2016 03/10/2016 02/09/2016  Does Patient Have a Medical Advance Directive? Yes Yes Yes Yes Yes Yes Yes  Type of Paramedic of [place];Living will [place];Living will Living will Living will Healthcare Power of [place];Living will [place];Living will  Does patient want to make changes to medical advance directive? No - Patient declined No - Patient declined - - - - -  Copy of [place] in Chart? No - copy requested No - copy requested - - - No - copy requested No - copy requested    Tobacco Social History   Tobacco Use  Smoking Status Former Smoker  . Quit date: 02/08/1954  . Years since quitting: 65.3  Smokeless Tobacco Never Used     Counseling given: Not Answered   Clinical Intake:  Pre-visit preparation completed: Yes  Pain : 0-10 Pain Score: 5  Pain Type: Acute  pain Pain Location: Back Pain Orientation: Lower Pain Descriptors / Indicators: Aching, Discomfort Pain Onset: More than a month ago Pain Frequency: Intermittent     BMI - recorded: 19.23 Nutritional Status: BMI of 19-24  Normal Nutritional Risks: None Diabetes: No  How often do you need to have someone help you when you read instructions, pamphlets, or other written materials from your doctor or pharmacy?: 1 - Never  Interpreter Needed?: No  Information entered by :: Sheral Flow, LPN  Past Medical History:  Diagnosis Date  . Atrial fibrillation ([place])    pacemaker, chronic anticoag  . Breast cancer ([place])   . Diverticulosis   . Fibroma    inner lips/mouth  . GERD (gastroesophageal reflux disease)   . Hx of breast cancer   . Hyperkalemia   . Hypertension   . Hypothyroidism   . IBS (irritable bowel syndrome)   . Internal hemorrhoids   . Left inguinal hernia    direct  . MVP (mitral valve prolapse)   . Osteoporosis   . Renal insufficiency   . Thyroid cancer [facility])    Past Surgical History:  Procedure Laterality Date  . COLONOSCOPY  10/28/2005   internal hemorrhoids, diverticulosis (same as in 2002 and random bxs negative then)  . EP IMPLANTABLE DEVICE N/A 02/26/2015   Procedure: PPM Generator Changeout;  Surgeon: [name], MD;  Location: [facility];  Service: Cardiovascular;  Laterality: N/A;  . MASTECTOMY  1982   Bilateral  .  PACEMAKER INSERTION    . THYROIDECTOMY    . TONSILLECTOMY    . UPPER GASTROINTESTINAL ENDOSCOPY  09/06/2000   normal   Family History  Problem Relation Age of Onset  . Colon cancer Father 108  . Heart disease Father   . Stroke Mother   . Arthritis Other   . Hypertension Other   . Esophageal cancer Neg Hx   . Pancreatic cancer Neg Hx   . Kidney disease Neg Hx   . Liver disease Neg Hx   . Diabetes Neg Hx   . Rectal cancer Neg Hx   . Stomach cancer Neg Hx    Social History   Socioeconomic History  . Marital  status: Widowed    Spouse name: Not on file  . Number of children: 3  . Years of education: Not on file  . Highest education level: Not on file  Occupational History  . Occupation: Retired   Tobacco Use  . Smoking status: Former Smoker    Quit date: 02/08/1954    Years since quitting: 65.3  . Smokeless tobacco: Never Used  Substance and Sexual Activity  . Alcohol use: No    Alcohol/week: 0.0 standard drinks  . Drug use: No  . Sexual activity: Not Currently  Other Topics Concern  . Not on file  Social History Narrative   Regular Exercise: Yes   Daily Caffeine Use: Sometimes.            Social Determinants of Health   Financial Resource Strain:   . Difficulty of Paying Living Expenses:   Food Insecurity:   . Worried About Charity fundraiser in the Last Year:   . Arboriculturist in the Last Year:   Transportation Needs:   . Film/video editor (Medical):   Marland Kitchen Lack of Transportation (Non-Medical):   Physical Activity:   . Days of Exercise per Week:   . Minutes of Exercise per Session:   Stress:   . Feeling of Stress :   Social Connections:   . Frequency of Communication with Friends and Family:   . Frequency of Social Gatherings with Friends and Family:   . Attends Religious Services:   . Active Member of Clubs or Organizations:   . Attends Archivist Meetings:   Marland Kitchen Marital Status:     Outpatient Encounter Medications as of 06/12/2019  Medication Sig  . acetaminophen (TYLENOL ARTHRITIS PAIN) 650 MG CR tablet Take 1,300 mg by mouth 2 (two) times daily.    Marland Kitchen azelastine (ASTELIN) 0.1 % nasal spray Place 1 spray into both nostrils 2 (two) times daily. Use in each nostril as directed  . calcium-vitamin D (OSCAL WITH D) 500-200 MG-UNIT tablet Take 1 tablet by mouth 2 (two) times daily.  . clonazePAM (KLONOPIN) 0.5 MG tablet Take 1 tablet by mouth twice daily as needed for anxiety  . Eluxadoline (VIBERZI) 75 MG TABS Take 1 tablet by mouth daily.  . furosemide  (LASIX) 40 MG tablet Take 1 tablet (40 mg total) by mouth daily.  Marland Kitchen levothyroxine (EUTHYROX) 88 MCG tablet Take 1 tablet (88 mcg total) by mouth daily before breakfast.  . montelukast (SINGULAIR) 10 MG tablet Take 1 tablet (10 mg total) by mouth at bedtime.  . potassium chloride SA (KLOR-CON M20) 20 MEQ tablet Take 1 tablet (20 mEq total) by mouth daily.  Marland Kitchen triamcinolone (NASACORT) 55 MCG/ACT AERO nasal inhaler Place 2 sprays into the nose daily.  Marland Kitchen warfarin (COUMADIN) 3 MG tablet  Take 1 tablet daily except 1/2 tablet on Mondays and Fridays or as directed by Anticoagulation Clinic.   No facility-administered encounter medications on file as of 06/12/2019.    Activities of Daily Living In your present state of health, do you have any difficulty performing the following activities: 06/12/2019  Hearing? Y  Comment wears hearing aids  Vision? N  Difficulty concentrating or making decisions? Y  Comment sometimes  Walking or climbing stairs? N  Dressing or bathing? N  Doing errands, shopping? N  Preparing Food and eating ? N  Using the Toilet? N  In the past six months, have you accidently leaked urine? N  Do you have problems with loss of bowel control? Y  Comment has IBS  Managing your Medications? N  Managing your Finances? N  Housekeeping or managing your Housekeeping? N  Some recent data might be hidden    Patient Care Team: [name], MD as PCP - General [name], MD as PCP - Cardiology (Cardiology) [name], MD (Cardiology)    Assessment:   This is a routine wellness examination for [place].  Exercise Activities and Dietary recommendations Current Exercise Habits: Home exercise routine, Type of exercise: Other - see comments(does zoom exercises), Time (Minutes): 50, Frequency (Times/Week): 3, Weekly Exercise (Minutes/Week): 150, Intensity: Mild, Exercise limited by: orthopedic condition(s);cardiac condition(s)  Goals    . Client understands the importance of  follow-up with providers by attending scheduled visits    . Patient Stated     Continue to be as healthy and as independent as possible.  Will continue to do exercise via zoom and be physically active.       Fall Risk Fall Risk  06/12/2019 05/09/2019 10/27/2017 09/16/2016 09/15/2016  Falls in the past year? 0 0 No No No  Number falls in past yr: 0 0 - - -  Injury with Fall? 0 0 - - -  Risk for fall due to : Orthopedic patient - - - -  Risk for fall due to: Comment lower back and arthritis - - - -  Follow up Education provided;Falls evaluation completed;Falls prevention discussed - - - -   Is the patient's home free of loose throw rugs in walkways, pet beds, electrical cords, etc?   yes      Grab bars in the bathroom? yes      Handrails on the stairs?   yes      Adequate lighting?   yes   Depression Screen PHQ 2/9 Scores 06/12/2019 03/07/2018 09/16/2016 08/08/2014  PHQ - 2 Score 0 0 0 0           Immunization History  Administered Date(s) Administered  . Fluad Quad(high Dose 65+) 10/19/2018  . Influenza Split 11/27/2010, 12/23/2011  . Influenza Whole 12/13/2008, 12/01/2009  . Influenza, High Dose Seasonal PF 12/12/2012, 11/05/2015, 11/25/2016, 10/27/2017  . Influenza,inj,Quad PF,6+ Mos 11/29/2013, 11/18/2014  . PFIZER SARS-COV-2 Vaccination 02/27/2019, 03/20/2019  . Pneumococcal Conjugate-13 02/11/2014  . Pneumococcal Polysaccharide-23 04/23/2010  . Td 04/23/2010  . Tdap 02/12/2018  . Zoster 08/24/2012     Screening Tests Health Maintenance  Topic Date Due  . INFLUENZA VACCINE  09/09/2019  . TETANUS/TDAP  02/13/2028  . DEXA SCAN  Completed  . COVID-19 Vaccine  Completed  . PNA vac Low Risk Adult  Completed    Cancer Screenings: Lung: Low Dose CT Chest recommended if Age 4-80 years, 30 pack-year currently smoking OR have quit w/in 15years. Patient does not qualify. Breast:  Up to date on Mammogram? Yes   Up to date of Bone Density/Dexa? Yes Colorectal: Yes  Additional  Screenings: Hepatitis C Screening:      Plan:     Reviewed health maintenance screenings with patient today and relevant education, vaccines, and/or referrals were provided.    Continue doing brain stimulating activities (puzzles, reading, adult coloring books, staying active) to keep memory sharp.    Continue to eat heart healthy diet (full of fruits, vegetables, whole grains, lean protein, water--limit salt, fat, and sugar intake) and increase physical activity as tolerated.  I have personally reviewed and noted the following in the patient's chart:   . Medical and social history . Use of alcohol, tobacco or illicit drugs  . Current medications and supplements . Functional ability and status . Nutritional status . Physical activity . Advanced directives . List of other physicians . Hospitalizations, surgeries, and ER visits in previous 12 months . Vitals . Screenings to include cognitive, depression, and falls . Referrals and appointments  In addition, I have reviewed and discussed with patient certain preventive protocols, quality metrics, and best practice recommendations. A written personalized care plan for preventive services as well as general preventive health recommendations were provided to patient.     Sheral Flow, LPN  [phone]  Nurse Health Advisor  Medical screening examination/treatment/procedure(s) were performed by non-physician practitioner and as supervising physician I was immediately available for consultation/collaboration. I agree with above. [name], MD

## 2019-06-12 NOTE — Patient Instructions (Signed)
Health Maintenance, Female Adopting a healthy lifestyle and getting preventive care are important in promoting health and wellness. Ask your health care provider about:  The right schedule for you to have regular tests and exams.  Things you can do on your own to prevent diseases and keep yourself healthy. What should I know about diet, weight, and exercise? Eat a healthy diet   Eat a diet that includes plenty of vegetables, fruits, low-fat dairy products, and lean protein.  Do not eat a lot of foods that are high in solid fats, added sugars, or sodium. Maintain a healthy weight Body mass index (BMI) is used to identify weight problems. It estimates body fat based on height and weight. Your health care provider can help determine your BMI and help you achieve or maintain a healthy weight. Get regular exercise Get regular exercise. This is one of the most important things you can do for your health. Most adults should:  Exercise for at least 150 minutes each week. The exercise should increase your heart rate and make you sweat (moderate-intensity exercise).  Do strengthening exercises at least twice a week. This is in addition to the moderate-intensity exercise.  Spend less time sitting. Even light physical activity can be beneficial. Watch cholesterol and blood lipids Have your blood tested for lipids and cholesterol at 84 years of age, then have this test every 5 years. Have your cholesterol levels checked more often if:  Your lipid or cholesterol levels are high.  You are older than 84 years of age.  You are at high risk for heart disease. What should I know about cancer screening? Depending on your health history and family history, you may need to have cancer screening at various ages. This may include screening for:  Breast cancer.  Cervical cancer.  Colorectal cancer.  Skin cancer.  Lung cancer. What should I know about heart disease, diabetes, and high blood  pressure? Blood pressure and heart disease  High blood pressure causes heart disease and increases the risk of stroke. This is more likely to develop in people who have high blood pressure readings, are of African descent, or are overweight.  Have your blood pressure checked: ? Every 3-5 years if you are 18-39 years of age. ? Every year if you are 40 years old or older. Diabetes Have regular diabetes screenings. This checks your fasting blood sugar level. Have the screening done:  Once every three years after age 40 if you are at a normal weight and have a low risk for diabetes.  More often and at a younger age if you are overweight or have a high risk for diabetes. What should I know about preventing infection? Hepatitis B If you have a higher risk for hepatitis B, you should be screened for this virus. Talk with your health care provider to find out if you are at risk for hepatitis B infection. Hepatitis C Testing is recommended for:  Everyone born from 1945 through 1965.  Anyone with known risk factors for hepatitis C. Sexually transmitted infections (STIs)  Get screened for STIs, including gonorrhea and chlamydia, if: ? You are sexually active and are younger than 84 years of age. ? You are older than 84 years of age and your health care provider tells you that you are at risk for this type of infection. ? Your sexual activity has changed since you were last screened, and you are at increased risk for chlamydia or gonorrhea. Ask your health care provider if   you are at risk.  Ask your health care provider about whether you are at high risk for HIV. Your health care provider may recommend a prescription medicine to help prevent HIV infection. If you choose to take medicine to prevent HIV, you should first get tested for HIV. You should then be tested every 3 months for as long as you are taking the medicine. Pregnancy  If you are about to stop having your period (premenopausal) and  you may become pregnant, seek counseling before you get pregnant.  Take 400 to 800 micrograms (mcg) of folic acid every day if you become pregnant.  Ask for birth control (contraception) if you want to prevent pregnancy. Osteoporosis and menopause Osteoporosis is a disease in which the bones lose minerals and strength with aging. This can result in bone fractures. If you are 65 years old or older, or if you are at risk for osteoporosis and fractures, ask your health care provider if you should:  Be screened for bone loss.  Take a calcium or vitamin D supplement to lower your risk of fractures.  Be given hormone replacement therapy (HRT) to treat symptoms of menopause. Follow these instructions at home: Lifestyle  Do not use any products that contain nicotine or tobacco, such as cigarettes, e-cigarettes, and chewing tobacco. If you need help quitting, ask your health care provider.  Do not use street drugs.  Do not share needles.  Ask your health care provider for help if you need support or information about quitting drugs. Alcohol use  Do not drink alcohol if: ? Your health care provider tells you not to drink. ? You are pregnant, may be pregnant, or are planning to become pregnant.  If you drink alcohol: ? Limit how much you use to 0-1 drink a day. ? Limit intake if you are breastfeeding.  Be aware of how much alcohol is in your drink. In the [place] one drink equals one 12 oz bottle of beer (355 mL), one 5 oz glass of wine (148 mL), or one 1 oz glass of hard liquor (44 mL). General instructions  Schedule regular health, dental, and eye exams.  Stay current with your vaccines.  Tell your health care provider if: ? You often feel depressed. ? You have ever been abused or do not feel safe at home. Summary  Adopting a healthy lifestyle and getting preventive care are important in promoting health and wellness.  Follow your health care provider's instructions about healthy  diet, exercising, and getting tested or screened for diseases.  Follow your health care provider's instructions on monitoring your cholesterol and blood pressure. This information is not intended to replace advice given to you by your health care provider. Make sure you discuss any questions you have with your health care provider. Document Revised: 01/18/2018 Document Reviewed: 01/18/2018 Elsevier Patient Education  2020 [employer]  

## 2019-06-12 NOTE — Progress Notes (Signed)
Subjective:  Patient ID: Alyssa [name], female    DOB: [birth year]  Age: 86 y.o. MRN: [id number]  CC: Annual Exam and Hypothyroidism  This visit occurred during the SARS-CoV-2 public health emergency.  Safety protocols were in place, including screening questions prior to the visit, additional usage of staff PPE, and extensive cleaning of exam room while observing appropriate contact time as indicated for disinfecting solutions.    HPI Alyssa [name] presents for a CPX.  She tells me that her low back pain is getting better.  She has rare episodes of lower extremity edema and the swelling is well controlled with furosemide.  Outpatient Medications Prior to Visit  Medication Sig Dispense Refill  . acetaminophen (TYLENOL ARTHRITIS PAIN) 650 MG CR tablet Take 1,300 mg by mouth 2 (two) times daily.      Marland Kitchen azelastine (ASTELIN) 0.1 % nasal spray Place 1 spray into both nostrils 2 (two) times daily. Use in each nostril as directed 90 mL 1  . calcium-vitamin D (OSCAL WITH D) 500-200 MG-UNIT tablet Take 1 tablet by mouth 2 (two) times daily. 180 tablet 1  . clonazePAM (KLONOPIN) 0.5 MG tablet Take 1 tablet by mouth twice daily as needed for anxiety 180 tablet 1  . Eluxadoline (VIBERZI) 75 MG TABS Take 1 tablet by mouth daily. 56 tablet 0  . furosemide (LASIX) 40 MG tablet Take 1 tablet (40 mg total) by mouth daily. 90 tablet 3  . montelukast (SINGULAIR) 10 MG tablet Take 1 tablet (10 mg total) by mouth at bedtime. 90 tablet 1  . triamcinolone (NASACORT) 55 MCG/ACT AERO nasal inhaler Place 2 sprays into the nose daily. 32.4 mL 1  . warfarin (COUMADIN) 3 MG tablet Take 1 tablet daily except 1/2 tablet on Mondays and Fridays or as directed by Anticoagulation Clinic. 90 tablet 0  . levothyroxine (EUTHYROX) 88 MCG tablet Take 1 tablet (88 mcg total) by mouth daily before breakfast. 90 tablet 0  . potassium chloride SA (KLOR-CON M20) 20 MEQ tablet Take 1 tablet (20 mEq total) by mouth daily. 90 tablet 3    No facility-administered medications prior to visit.    ROS Review of Systems  Constitutional: Negative for diaphoresis, fatigue and unexpected weight change.  HENT: Negative.   Eyes: Negative for visual disturbance.  Respiratory: Negative for cough, chest tightness, shortness of breath and wheezing.   Cardiovascular: Positive for leg swelling. Negative for chest pain and palpitations.  Gastrointestinal: Negative for abdominal pain, constipation, diarrhea, nausea and vomiting.  Endocrine: Negative for cold intolerance and heat intolerance.  Genitourinary: Negative.  Negative for difficulty urinating.  Musculoskeletal: Positive for back pain.  Skin: Negative.   Neurological: Negative.  Negative for dizziness, weakness and light-headedness.  Hematological: Negative for adenopathy. Does not bruise/bleed easily.  Psychiatric/Behavioral: Negative.     Objective:  BP (!) 146/74 (BP Location: Left Arm, Patient Position: Sitting, Cuff Size: Normal)   Pulse 68   Temp 98.3 F (36.8 C) (Oral)   Resp 16   Ht 5' 6.5" (1.689 m)   Wt 122 lb (55.3 kg)   SpO2 96%   BMI 19.40 kg/m   BP Readings from Last 3 Encounters:  06/12/19 (!) 146/74  06/12/19 118/60  05/09/19 (!) 148/80    Wt Readings from Last 3 Encounters:  06/12/19 122 lb (55.3 kg)  06/12/19 122 lb 12.8 oz (55.7 kg)  05/09/19 127 lb (57.6 kg)    Physical Exam Vitals reviewed.  Constitutional:      Appearance:  Normal appearance.  HENT:     Nose: Nose normal.     Mouth/Throat:     Mouth: Mucous membranes are moist.  Eyes:     General: No scleral icterus.    Conjunctiva/sclera: Conjunctivae normal.  Cardiovascular:     Rate and Rhythm: Normal rate and regular rhythm.     Heart sounds: Murmur present. Systolic murmur present with a grade of 2/6. No diastolic murmur. No gallop.   Pulmonary:     Effort: Pulmonary effort is normal.     Breath sounds: No stridor. No wheezing, rhonchi or rales.  Abdominal:     General:  Abdomen is flat.     Palpations: There is no mass.     Tenderness: There is no abdominal tenderness. There is no guarding.  Musculoskeletal:        General: Normal range of motion.     Cervical back: Neck supple.     Right lower leg: Edema (trace) present.     Left lower leg: Edema (trace) present.  Lymphadenopathy:     Cervical: No cervical adenopathy.  Skin:    General: Skin is warm and dry.     Coloration: Skin is not pale.  Neurological:     General: No focal deficit present.     Mental Status: She is alert.     Lab Results  Component Value Date   WBC 4.2 06/12/2019   HGB 12.2 06/12/2019   HCT 36.5 06/12/2019   PLT 166.0 06/12/2019   GLUCOSE 95 06/12/2019   CHOL 155 02/11/2014   TRIG 84.0 02/11/2014   HDL 46.10 02/11/2014   LDLCALC 92 02/11/2014   ALT 19 02/21/2018   AST 23 02/21/2018   NA 139 06/12/2019   K 5.0 06/12/2019   CL 104 06/12/2019   CREATININE 1.38 (H) 06/12/2019   BUN 47 (H) 06/12/2019   CO2 30 06/12/2019   TSH 0.24 (L) 06/12/2019   INR 2.4 05/22/2019   HGBA1C 5.7 06/12/2019    DG Thoracic Spine W/Swimmers  Result Date: 12/28/2018 CLINICAL DATA:  Mid back pain and lower back pain since a twisting injury 2 months ago. Left leg pain and tingling. EXAM: THORACIC SPINE - 3 VIEWS COMPARISON:  Chest x-ray dated 02/20/2018 and CT scan of the chest dated 07/04/2016 FINDINGS: There is an old healed compression fracture of the superior aspect of T12, unchanged since 05/04/2016. The remainder of the thoracic spine appears normal. IMPRESSION: No acute abnormality of the thoracic spine. Old healed compression fracture of T12, stable. Electronically Signed   By: Lorriane Shire M.D.   On: 12/28/2018 11:14   DG Lumbar Spine Complete  Result Date: 12/28/2018 CLINICAL DATA:  Mid and lower back pain since a twisting injury 2 months ago. Left leg pain and tingling. EXAM: LUMBAR SPINE - COMPLETE 4+ VIEW COMPARISON:  CT scan of the abdomen and pelvis dated 08/29/2014  FINDINGS: There is no evidence of a lumbar spine fracture or bone destruction. Chronic disc space narrowing and disc degeneration at L2-3 and L3-4 and L5-S1. Lateral alignment is normal. Moderate bilateral facet arthritis at L4-5 and L5-S1. Minimal lumbar scoliosis with convexity to the right centered at L3. Old healed mild compression fracture of T12, stable since 2016. Aortic atherosclerosis. IMPRESSION: 1. No acute abnormality of the lumbar spine. 2. 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 3. Old healed compression fracture of T12. 4.  Aortic Atherosclerosis (ICD10-I70.0). Electronically Signed   By: Lorriane Shire M.D.  On: 12/28/2018 11:16    Assessment & Plan:   Alyssa [name] was seen today for annual exam and hypothyroidism.  Diagnoses and all orders for this visit:  Essential hypertension, benign- Her blood pressure is adequately well controlled. -     CBC with Differential/Platelet; Future -     Basic metabolic panel; Future -     Basic metabolic panel -     CBC with Differential/Platelet  Acquired hypothyroidism- Her TSH is suppressed.  I recommended that she decrease her dose of levothyroxine. -     TSH; Future -     TSH -     levothyroxine (EUTHYROX) 50 MCG tablet; Take 1 tablet (50 mcg total) by mouth daily before breakfast.  Chronic renal impairment, stage 3b- Her renal function is stable.  She agrees to avoid nephrotoxic agents. -     CBC with Differential/Platelet; Future -     Basic metabolic panel; Future -     Urinalysis, Routine w reflex microscopic; Future -     Urinalysis, Routine w reflex microscopic -     Basic metabolic panel -     CBC with Differential/Platelet  Hyperglycemia- Improvement noted. -     Basic metabolic panel; Future -     Hemoglobin A1c; Future -     Hemoglobin A1c -     Basic metabolic panel  Routine general medical examination at a health care facility- Exam completed, labs reviewed, vaccines reviewed and updated, no  cancer screenings are indicated, patient education was given.  Need for pneumococcal vaccination -     Pneumococcal polysaccharide vaccine 23-valent greater than or equal to 2yo subcutaneous/IM   I have discontinued Alyssa [name]'s levothyroxine. I am also having her start on levothyroxine. Additionally, I am having her maintain her acetaminophen, triamcinolone, potassium chloride SA, furosemide, montelukast, azelastine, Viberzi, calcium-vitamin D, clonazePAM, and warfarin.  Meds ordered this encounter  Medications  . levothyroxine (EUTHYROX) 50 MCG tablet    Sig: Take 1 tablet (50 mcg total) by mouth daily before breakfast.    Dispense:  90 tablet    Refill:  1     Follow-up: Return in about 6 months (around 12/13/2019).  [name], MD

## 2019-06-13 ENCOUNTER — Encounter: Payer: Self-pay | Admitting: Internal Medicine

## 2019-06-13 MED ORDER — LEVOTHYROXINE SODIUM 50 MCG PO TABS: 50.0000 ug | ORAL_TABLET | Freq: Every day | ORAL | 1 refills | Status: DC

## 2019-06-18 ENCOUNTER — Telehealth: Payer: Self-pay | Admitting: Internal Medicine

## 2019-06-18 NOTE — Telephone Encounter (Signed)
Spoke to [facility] office and they informed me that [facility]'s disposition was to go to the ED.   I called patient and she stated the following: She has been experiencing intermittent SOB with chest tightness. There was one night that that the chest tightness woke her from sleep and she was not able to go back to sleep. Patient stated that she feels like it is the new (lower dose) thyroid medication that is causing her symptoms. Informed patient that lowering the dose of this medication would not cause that issue.   Patient stated that she was told to go to the ED but she did not want to go. I did explain to patient that I understand and that no one wants to go to the ED, however, not going with these types of symptoms can mean more complicated issues that need to be evaluated quickly. Patient stated understanding but would rather wait to see if it happened again. I explained to patient again that waiting for it to happen again could end up being too late. Patient stated understanding but still refused to go to the ED.

## 2019-06-18 NOTE — Telephone Encounter (Signed)
I spoke to the patient who has been experiencing SOB over the past 4 days with 1 episode of chest "tightness".    Her PCP recommended the ED, but the patient would like an OV to evaluate status.  I scheduled her with Renee on 5/12 to discuss symptoms.  She verbalized understanding, but knows to go to the ED, if symptoms worsen.

## 2019-06-18 NOTE — Telephone Encounter (Signed)
Patient was transferred to [facility] on  06/18/2019 12:19:30 PM and states that she is having SOB, takes a diruetic daily, a medicationw as changed for thyroid to a lower dose, and she thinks that may be causing, more often after taking the lower dose. states that she has SOB and that is normal for her. She states that she takes a diuretic. She states that her thyroid medication has changed about 4 days ago. She is having increased SOB and more with exertion.  User: Marquis Buggy, RN Date/Time Eilene Ghazi Time): 06/18/2019 12:38:27 PM Caller given the outcome to go to the ED and she does not want to do that. She asked if she could see her doctor today or speak with the office. Called the backline number and was put on hold for several minutes. (10 minutes with no contact for about 7 and then Disconnected.)  User: Marquis Buggy, RN Date/Time Eilene Ghazi Time): 06/18/2019 12:43:16 PM Long hold time on the backline with no one come back for > 7 minutes. Went back to the caller and told her that she needs to go to the ED for her increasing SOB and chest tightness. Refuses to go. Wants to speak with family doctor. This nurse advised her to wait about 30 minutes and call the office back and see if she can speak with her doctor or his assistant. And if she worsens to go to the ED and she verbalized understanding.

## 2019-06-18 NOTE — Telephone Encounter (Signed)
    Patient calling to report she feels levothyroxine (EUTHYROX) 50 MCG tablet is causing SOB Call transferred to [facility] RN

## 2019-06-18 NOTE — Telephone Encounter (Signed)
Pt c/o Shortness Of Breath: STAT if SOB developed within the last 24 hours or pt is noticeably SOB on the phone  1. Are you currently SOB (can you hear that pt is SOB on the phone)? yes  2. How long have you been experiencing SOB? 4 days   3. Are you SOB when sitting or when up moving around? After activity   4. Are you currently experiencing any other symptoms? Pt also has periods of about ~4 hrs when she has a hard time catching her breath at night

## 2019-06-18 NOTE — Telephone Encounter (Addendum)
[facility] nurse called around 12:20 pm to explain what was going on with the patient. Nurse asked to speak to PCP's assistant. MD and assistant out of office for the day. Tried calling several other assistants in office but was unable to reach anyone by phone. Placed caller on hold and walked to the back to find a [place] or nurse to speak with caller. After brief discussion with triage RN I was told to transfer call to her. Once I got back to the call the nurse had disconnected. I reached out to PCP's assistant who was at another office for the day after lunch around 1:30pm who stated she would reach out to patient.

## 2019-06-19 NOTE — Telephone Encounter (Signed)
Pt has an appointment with Cardiology on 06/20/2019.

## 2019-06-20 ENCOUNTER — Other Ambulatory Visit: Payer: Self-pay

## 2019-06-20 ENCOUNTER — Ambulatory Visit (INDEPENDENT_AMBULATORY_CARE_PROVIDER_SITE_OTHER): Payer: Medicare Other | Admitting: Physician Assistant

## 2019-06-20 VITALS — BP 136/64 | HR 80 | Ht 66.75 in | Wt 123.0 lb

## 2019-06-20 DIAGNOSIS — I442 Atrioventricular block, complete: Secondary | ICD-10-CM | POA: Diagnosis not present

## 2019-06-20 DIAGNOSIS — R0602 Shortness of breath: Secondary | ICD-10-CM | POA: Diagnosis not present

## 2019-06-20 DIAGNOSIS — I5030 Unspecified diastolic (congestive) heart failure: Secondary | ICD-10-CM | POA: Diagnosis not present

## 2019-06-20 DIAGNOSIS — Z950 Presence of cardiac pacemaker: Secondary | ICD-10-CM | POA: Diagnosis not present

## 2019-06-20 DIAGNOSIS — R079 Chest pain, unspecified: Secondary | ICD-10-CM

## 2019-06-20 NOTE — Progress Notes (Signed)
Cardiology Office Note Date:  06/20/2019  Patient ID:  Alyssa, [name] 1924/07/15, MRN [id number] PCP:  [name], MD  Electrophysiologist: Dr. Caryl Comes   Chief Complaint: new SOB/CP  History of Present Illness: Alyssa [name] is a 86 y.o. female with history of CHB w/PPM, permanent AFib, hypothyroidism, IBS, GERD, LE edema/DOE that waxes and wanes  2017 Dr. Caryl Comes mentioned historically a syncopal episode after prolonged standing and in the heat. Because of that somebody thought she might have been dehydrated and recommended that her diuretics be reduced. This was followed by increasing symptoms of shortness of breath and nocturnal dyspnea. She called and we suggested she increase her furosemide back to daily from every other day. With this her symptoms had largely resolved. She has some ongoing problems with dyspnea particularly at the end of the day and recommended at that visit she take her lasix BID alternating days with daily only.  She was in the ER 12/04/15 with an episode of dizziness/near syncope that occurred upon standing and SOB, orthostatic vitals were reported as negative, CXR negative, BMET/CBC stable, and pacer check with normal function and discharged from the ER to keep her visit in the office.  I saw her afterwards and and in discussion felt likely secondary to being a little dry with episodes of diarrhea prior to the symptoms.  She was seen by general cardiology for an acute increase/onset of SOB/DOE, 08/13/16 T. [place], PAC, not felt to be overtly fluid OL, though had been taking 60mg  dose of lasix once day and 40mg the day of that visit (usually on 20mg ), and planned for labs and to f/u  04/20/2016 I saw her,  She reports that the day she had called about feeling dizzy, she had been out all day shopping for hours and when she came home felt like she had likely just over done it.  She will occassionally feel fleetingly lightheaded upon standing, this was more a feeling of  being over tired perhaps, but to me today denies feeling like she was going to faint.  She does continue to have an unusually SOB, though this is different then her original SOB that seemed to bother her at night.  That particular symptom on 40mg  of lasix has resolved, se denies DOE or exertional symptoms at all, when she is up and about/busy or distracted she doesn't feel it, but in the afternoon when trying to relax is when she has felt this need to take a few breaths to feel like she is getting enough air, sometimes this takes a few minutes to settle.  She is up a couple pounds but does nt perceive water retention.   She explains to me that she suffered for 3 months early this year with her IBS and lost several pounds, and is finally eating better and thinks this is finally where her gain is from.  She denies any kind of CP or palpitations, no near syncope or syncope.  She inquires about anxiety as a source of her breathlessness, explaining that her life has been very busy, and seemed to take her a long time to start to feel better after her 3 mo IBS issues, and family trips, events and so on.  She denies any bleeding or signs of bleeding. She was felt to be euvolemic and discussed balance between diuretic and adequate hydration  She was last seen by 05/2018 via tele health by Dr. Caryl Comes, se was feeling well, no symptoms of volume OL.  He discussed jan 2020 hospitalized with elecrolyte imbalance, hyponatremia, her thiazide diuretic stopped, and also felt 2/2 diarrhea/GI illness as well. Planned to repeat labs w/hx of hyperkalemia as well.Marland Kitchen NSVT (preumably by remote pacer check, planned to check mag as well.  She called 5/10 w/CP and SOB, her PMD recommended ER visit, though she preferred to come in and be seen, given today's appt. (I see she had a virtual visit with her PMD 06/12/2019, no complaints of CP mentioned in the note), planned to update labs, her levothyroxine dose was reduced  Last TTE, 2018, LVEF  60-65%, mild LVH, Mod MR, LA and RA severely dilated, mod-severe TR, PA 9mmhg  She mentions it seems that since her synthroid dose was changed she has felt more SOB.  She has discussed with her PMD, and he did not think there was a correlation.  She has no daytime rest SOB, but does feel at night intermittently she feels a little SOB.  Once she had to sit up to feel better, felt a heaviness in her chest that was alarming to her, this passed quickly, but took a few pillows to feel comfortable to hours to feel comfortable to get back to sleep. No recurrent chest heaviness, no exertional CP, no dizziness, near syncope or syncope. She weighs daily with stable weights, she does nofeel swollen, or bloated Her son had a [place] fire, she was very worried about him and mentions that they tend to "but heads" and this is very stressful for her.  In hindsight she thinks this may have been the trigger  She bruises easily, denies bleeding or signs of bleeding   DEVICE information:  MDT dual chamber PPM, implanted 10/30/1998 with gen change 02/26/15, Dr. Caryl Comes, [place]    Past Medical History:  Diagnosis Date  . Atrial fibrillation ([place])    pacemaker, chronic anticoag  . Breast cancer ([place])   . Diverticulosis   . Fibroma    inner lips/mouth  . GERD (gastroesophageal reflux disease)   . Hx of breast cancer   . Hyperkalemia   . Hypertension   . Hypothyroidism   . IBS (irritable bowel syndrome)   . Internal hemorrhoids   . Left inguinal hernia    direct  . MVP (mitral valve prolapse)   . Osteoporosis   . Renal insufficiency   . Thyroid cancer [facility])     Past Surgical History:  Procedure Laterality Date  . COLONOSCOPY  10/28/2005   internal hemorrhoids, diverticulosis (same as in 2002 and random bxs negative then)  . EP IMPLANTABLE DEVICE N/A 02/26/2015   Procedure: PPM Generator Changeout;  Surgeon: [name], MD;  Location: [facility];  Service: Cardiovascular;   Laterality: N/A;  . MASTECTOMY  1982   Bilateral  . PACEMAKER INSERTION    . THYROIDECTOMY    . TONSILLECTOMY    . UPPER GASTROINTESTINAL ENDOSCOPY  09/06/2000   normal    Current Outpatient Medications  Medication Sig Dispense Refill  . acetaminophen (TYLENOL ARTHRITIS PAIN) 650 MG CR tablet Take 1,300 mg by mouth 2 (two) times daily.      Marland Kitchen azelastine (ASTELIN) 0.1 % nasal spray Place 1 spray into both nostrils 2 (two) times daily. Use in each nostril as directed 90 mL 1  . calcium-vitamin D (OSCAL WITH D) 500-200 MG-UNIT tablet Take 1 tablet by mouth 2 (two) times daily. 180 tablet 1  . clonazePAM (KLONOPIN) 0.5 MG tablet Take 1 tablet by mouth twice daily as  needed for anxiety 180 tablet 1  . Eluxadoline (VIBERZI) 75 MG TABS Take 1 tablet by mouth daily. 56 tablet 0  . furosemide (LASIX) 40 MG tablet Take 1 tablet (40 mg total) by mouth daily. 90 tablet 3  . levothyroxine (EUTHYROX) 50 MCG tablet Take 1 tablet (50 mcg total) by mouth daily before breakfast. 90 tablet 1  . montelukast (SINGULAIR) 10 MG tablet Take 1 tablet (10 mg total) by mouth at bedtime. 90 tablet 1  . potassium chloride SA (KLOR-CON M20) 20 MEQ tablet Take 1 tablet (20 mEq total) by mouth daily. 90 tablet 3  . triamcinolone (NASACORT) 55 MCG/ACT AERO nasal inhaler Place 2 sprays into the nose daily. 32.4 mL 1  . warfarin (COUMADIN) 3 MG tablet Take 1 tablet daily except 1/2 tablet on Mondays and Fridays or as directed by Anticoagulation Clinic. 90 tablet 0   No current facility-administered medications for this visit.    Allergies:   Patient has no known allergies.   Social History:  The patient  reports that she quit smoking about 65 years ago. She has never used smokeless tobacco. She reports that she does not drink alcohol or use drugs.   Family History:  The patient's family history includes Arthritis in an other family member; Colon cancer (age of onset: 30) in her father; Heart disease in her father;  Hypertension in an other family member; Stroke in her mother.  ROS:  Please see the history of present illness.  All other systems are reviewed and otherwise negative.   PHYSICAL EXAM: VS:  There were no vitals taken for this visit. BMI: There is no height or weight on file to calculate BMI. Well nourished, thin, though well developed, in no acute distress  HEENT: normocephalic, atraumatic  Neck: no JVD, carotid bruits or masses Cardiac:  RRR; no significant murmurs, no rubs, or gallops Lungs:  CTA b/l, no wheezing, rhonchi or rales  Abd: soft, nontender MS: no deformity, age appropriate atrophy Ext:  no edema  Skin: warm and dry, no rash Neuro:  No gross deficits appreciated Psych: euthymic mood, full affect  PPM site is stable, no tethering or discomfort, skin is intact, no threatening errosion, (she is very thin)   EKG:  Done today and reviewed by myself: V paced, motion, PVC   PPM interrogation today reviewed by myself:  Battery and lead measurements are good She has had some HVR episodes Most with markers available only Longest 12 seonds (twice) 03/08/2018 and 04/18/19 04/18/19  Has SEGM is more irregular and suspect brief RVR EGM avail for 02/26/19 is more clearly AF She has h/o NSVT   09/28/2016: TTE Study Conclusions  - Left ventricle: The cavity size was normal. Wall thickness was  increased in a pattern of mild LVH. Systolic function was normal.  The estimated ejection fraction was in the range of 60% to 65%.  - Mitral valve: Mild systolic bowing without prolapse. There was  moderate regurgitation. Valve area by pressure half-time: 2.2  cm^2.  - Left atrium: The atrium was severely dilated.  - Right atrium: The atrium was severely dilated.  - Tricuspid valve: There was moderate-severe regurgitation.  - Pulmonary arteries: Systolic pressure was mildly increased. PA  peak pressure: 32 mm Hg (S).    05/01/15: TTE Study Conclusions - Left ventricle: The  cavity size was normal. Wall thickness was   normal. Systolic function was normal. The estimated ejection   fraction was in the range of 55% to 60%.  Wall motion was normal;   there were no regional wall motion abnormalities. The study is   not technically sufficient to allow evaluation of LV diastolic   function. - Mitral valve: Calcified annulus. - Left atrium: The atrium was severely dilated. 40mm - Right ventricle: The cavity size was mildly dilated. - Right atrium: The atrium was severely dilated. - Tricuspid valve: There was severe regurgitation. - Pulmonary arteries: Systolic pressure was moderately increased.   PA peak pressure: 52 mm Hg (S). - Pericardium, extracardiac: A small pericardial effusion was   identified. Impressions: - Normal LV function; severe biatrial enlargement; mild RVE; severe   TR with moderately elevated pulmonary pressure; small pericardial   effusion.  Recent Labs: 12/28/2018: Magnesium 2.0 06/12/2019: BUN 47; Creatinine, Ser 1.38; Hemoglobin 12.2; Platelets 166.0; Potassium 5.0; Sodium 139; TSH 0.24  No results found for requested labs within last 8760 hours.   Estimated Creatinine Clearance: 22.4 mL/min (A) (by C-G formula based on SCr of 1.38 mg/dL (H)).   Wt Readings from Last 3 Encounters:  06/12/19 122 lb (55.3 kg)  06/12/19 122 lb 12.8 oz (55.7 kg)  05/09/19 127 lb (57.6 kg)     Other studies reviewed: Additional studies/records reviewed today include: summarized above  ASSESSMENT AND PLAN:  1. CHB/PPM     normal device function, no changes made  2. Permanent Afib     CHA2DS2Vasc is at least 3 on warfarin, monitored and managed with the coumadin clinic       3. HFpEF     She describes mostly what sounds like orthopnea, once perhaps PND     She is not overtly volume OL by her exam     Has had some waxing/waning difficulties with this in the past 4. CP     Not recurrent     Discussed with her, she is not inclined to pursue ischemic  work up at her age  Will update her echo, she is instructed to take BID lasix today and tomorrow then back to daily.  When she goes to daily lasix again, reduce her K+ in 1/2 to 7meq She had hx of hyperkalemia, her last K+ 5.0 (last week)  If no better or any escalation in symptoms to let us know or seek attention. Will follow up in 6 weeks, sooner if needed       Disposition: as above  Current medicines are reviewed at length with the patient today.  The patient did not have any concerns regarding medicines.  Haywood Lasso, PA-C 06/20/2019 5:25 AM     [facility] [address] [place] [address] [phone] (office)  [phone] (fax)

## 2019-06-20 NOTE — Patient Instructions (Addendum)
  Medication Instructions:    (TODAY ) Wednesday AND Thursday  ONLY: TAKE LASIX TWICE A DAY THEN BACK TO ONCE A DAY     Friday START TAKING POTASSIUM 10 MEQ ONCE A DAY    *If you need a refill on your cardiac medications before your next appointment, please call your pharmacy*   Lab Work: NONE ORDERED  TODAY   If you have labs (blood work) drawn today and your tests are completely normal, you will receive your results only by: Marland Kitchen MyChart Message (if you have MyChart) OR . A paper copy in the mail If you have any lab test that is abnormal or we need to change your treatment, we will call you to review the results.   Testing/Procedures: SAME DAY AS COUMADIN  Your physician has requested that you have an echocardiogram. Echocardiography is a painless test that uses sound waves to create images of your heart. It provides your doctor with information about the size and shape of your heart and how well your heart's chambers and valves are working. This procedure takes approximately one hour. There are no restrictions for this procedure.   Follow-Up: At [facility], you and your health needs are our priority.  As part of our continuing mission to provide you with exceptional heart care, we have created designated Provider Care Teams.  These Care Teams include your primary Cardiologist (physician) and Advanced Practice Providers (APPs -  Physician Assistants and Nurse Practitioners) who all work together to provide you with the care you need, when you need it.  We recommend signing up for the patient portal called "MyChart".  Sign up information is provided on this After Visit Summary.  MyChart is used to connect with patients for Virtual Visits (Telemedicine).  Patients are able to view lab/test results, encounter notes, upcoming appointments, etc.  Non-urgent messages can be sent to your provider as well.   To learn more about what you can do with MyChart, go to [email or web address].     Your next appointment:   4-6 WEEKS   The format for your next appointment:   In Person  Provider:   You may see  or one of the following Advanced Practice Providers on your designated Care Team:    Chanetta Marshall, NP  Legrand Como "Oda [place]ts, [place]  Other Instructions

## 2019-06-21 ENCOUNTER — Ambulatory Visit (INDEPENDENT_AMBULATORY_CARE_PROVIDER_SITE_OTHER): Payer: Medicare Other | Admitting: *Deleted

## 2019-06-21 DIAGNOSIS — I480 Paroxysmal atrial fibrillation: Secondary | ICD-10-CM

## 2019-06-21 DIAGNOSIS — I442 Atrioventricular block, complete: Secondary | ICD-10-CM

## 2019-06-22 ENCOUNTER — Telehealth: Payer: Self-pay

## 2019-06-22 LAB — CUP PACEART REMOTE DEVICE CHECK
Battery Impedance: 522 Ohm
Battery Remaining Longevity: 81 mo
Battery Voltage: 2.79 V
Brady Statistic RV Percent Paced: 99 %
Date Time Interrogation Session: 20210514092256
Implantable Lead Implant Date: 20000921
Implantable Lead Implant Date: 20000921
Implantable Lead Location: 753859
Implantable Lead Location: 753860
Implantable Pulse Generator Implant Date: 20170118
Lead Channel Impedance Value: 577 Ohm
Lead Channel Impedance Value: 67 Ohm
Lead Channel Pacing Threshold Amplitude: 0.75 V
Lead Channel Pacing Threshold Pulse Width: 0.4 ms
Lead Channel Setting Pacing Amplitude: 2.5 V
Lead Channel Setting Pacing Pulse Width: 0.4 ms
Lead Channel Setting Sensing Sensitivity: 2 mV

## 2019-06-22 NOTE — Telephone Encounter (Signed)
Spoke with patient to remind of missed remote transmission 

## 2019-06-25 NOTE — Progress Notes (Signed)
Remote pacemaker transmission.   

## 2019-07-02 ENCOUNTER — Telehealth: Payer: Self-pay | Admitting: Internal Medicine

## 2019-07-02 DIAGNOSIS — I442 Atrioventricular block, complete: Secondary | ICD-10-CM

## 2019-07-02 NOTE — Progress Notes (Signed)
°  Chronic Care Management   Note  07/02/2019 Name: Alyssa [name] MRN: [id number] DOB: May 21, 1924  Alyssa [name] is a 86 y.o. year old female who is a primary care patient of [name], MD. I reached out to [name] by phone today in response to a referral sent by Ms. Eileen Stanford PCP, [name], MD.   Ms. Pich was given information about Chronic Care Management services today including:  1. CCM service includes personalized support from designated clinical staff supervised by her physician, including individualized plan of care and coordination with other care providers 2. 24/7 contact phone numbers for assistance for urgent and routine care needs. 3. Service will only be billed when office clinical staff spend 20 minutes or more in a month to coordinate care. 4. Only one practitioner may furnish and bill the service in a calendar month. 5. The patient may stop CCM services at any time (effective at the end of the month) by phone call to the office staff.   Patient agreed to services and verbal consent obtained.   This note is not being shared with the patient for the following reason: To respect privacy (The patient or proxy has requested that the information not be shared).  Follow up plan:   Earney Hamburg [employer] Scheduler

## 2019-07-03 ENCOUNTER — Ambulatory Visit (INDEPENDENT_AMBULATORY_CARE_PROVIDER_SITE_OTHER): Payer: Medicare Other | Admitting: Pharmacist

## 2019-07-03 ENCOUNTER — Ambulatory Visit (HOSPITAL_COMMUNITY): Payer: Medicare Other | Attending: Cardiology

## 2019-07-03 ENCOUNTER — Other Ambulatory Visit: Payer: Self-pay

## 2019-07-03 DIAGNOSIS — R0602 Shortness of breath: Secondary | ICD-10-CM

## 2019-07-03 DIAGNOSIS — I4891 Unspecified atrial fibrillation: Secondary | ICD-10-CM

## 2019-07-03 DIAGNOSIS — Z5181 Encounter for therapeutic drug level monitoring: Secondary | ICD-10-CM

## 2019-07-03 DIAGNOSIS — R079 Chest pain, unspecified: Secondary | ICD-10-CM

## 2019-07-03 LAB — POCT INR: INR: 2 (ref 2.0–3.0)

## 2019-07-03 NOTE — Patient Instructions (Signed)
Continue to take 1 tablet every day except for 1/2 tablet on Mondays and Fridays. Recheck in 6 weeks. Call with any new or different medications [phone]

## 2019-07-10 ENCOUNTER — Telehealth: Payer: Self-pay | Admitting: *Deleted

## 2019-07-10 MED ORDER — POTASSIUM CHLORIDE CRYS ER 20 MEQ PO TBCR: 10.0000 meq | EXTENDED_RELEASE_TABLET | Freq: Every day | ORAL | 3 refills | Status: DC

## 2019-07-10 NOTE — Telephone Encounter (Signed)
Spoke with patient and doing okay. Patient has been taking only half of potassium and only Lasix 40 mg once a day.

## 2019-07-18 ENCOUNTER — Telehealth: Payer: Self-pay | Admitting: Internal Medicine

## 2019-07-18 NOTE — Telephone Encounter (Signed)
Patient states she spoke with Rosann Auerbach a week or so ago about her potassium. She states she has been having a lot of cramps and would like to discuss this with the nurse.

## 2019-07-18 NOTE — Telephone Encounter (Signed)
Attempted phone call to pt.  Left voicemail message to contact RN at [phone]. 

## 2019-07-19 ENCOUNTER — Telehealth: Payer: Self-pay | Admitting: Internal Medicine

## 2019-07-19 NOTE — Telephone Encounter (Addendum)
    Patient wants to know if recent episodes of leg cramps can be a result of dosage change of levothyroxine (EUTHYROX) 50 MCG tablet  Please contact patient at [phone]

## 2019-07-19 NOTE — Telephone Encounter (Signed)
Follow Up  Patient returning call. Please give a call back.

## 2019-07-19 NOTE — Telephone Encounter (Signed)
Spoke with pt who states she has been having leg and hand cramps almost daily for the last few weeks.  Pt's K+ on 06/12/2019 was 5.0.  Pt reports she has been taking her KCL as prescribed.  Pt does report her thyroid medication has recently been changed by her PCP d/t abnormal thyroid labs.  Pt has appt scheduled with Tommye Standard, PA-C 08/15/2019. Pt reports she feels well with no other symptoms. Pt advised to contact her PCP as abnormal thyroid can cause cramping as well and plan to keep appointment 07/07 appointment.  Pt to contact office for earlier appointment if needed after speaking with PCP.  Pt verbalizes understanding and agrees with current plan.

## 2019-07-20 NOTE — Telephone Encounter (Signed)
Would levothyroxine cause leg cramps?

## 2019-07-23 NOTE — Telephone Encounter (Signed)
Per PCP - levothyroxine would not cause leg cramps.   Pt contacted and informed of same.

## 2019-07-28 ENCOUNTER — Other Ambulatory Visit: Payer: Self-pay | Admitting: Internal Medicine

## 2019-08-07 DIAGNOSIS — H1849 Other corneal degeneration: Secondary | ICD-10-CM | POA: Diagnosis not present

## 2019-08-13 NOTE — Progress Notes (Signed)
Cardiology Office Note Date:  08/13/2019  Patient ID:  Alyssa [name], Alyssa [name] 23-Feb-1924, MRN [id number] PCP:  [name], MD  Electrophysiologist: Dr. Caryl Comes   Chief Complaint: new SOB/CP  History of Present Illness: Alyssa [name] is a 86 y.o. female with history of CHB w/PPM, permanent AFib, hypothyroidism, IBS, GERD, LE edema/DOE that waxes and wanes  2017 Dr. Caryl Comes mentioned historically a syncopal episode after prolonged standing and in the heat. Because of that somebody thought she might have been dehydrated and recommended that her diuretics be reduced. This was followed by increasing symptoms of shortness of breath and nocturnal dyspnea. She called and we suggested she increase her furosemide back to daily from every other day. With this her symptoms had largely resolved. She has some ongoing problems with dyspnea particularly at the end of the day and recommended at that visit she take her lasix BID alternating days with daily only.  She was in the ER 12/04/15 with an episode of dizziness/near syncope that occurred upon standing and SOB, orthostatic vitals were reported as negative, CXR negative, BMET/CBC stable, and pacer check with normal function and discharged from the ER to keep her visit in the office.  I saw her afterwards and and in discussion felt likely secondary to being a little dry with episodes of diarrhea prior to the symptoms.  She was seen by general cardiology for an acute increase/onset of SOB/DOE, 08/13/16 T. [place], PAC, not felt to be overtly fluid OL, though had been taking 60mg  dose of lasix once day and 40mg the day of that visit (usually on 20mg ), and planned for labs and to f/u  04/20/2016 I saw her,  She reports that the day she had called about feeling dizzy, she had been out all day shopping for hours and when she came home felt like she had likely just over done it.  She will occassionally feel fleetingly lightheaded upon standing, this was more a feeling of  being over tired perhaps, but to me today denies feeling like she was going to faint.  She does continue to have an unusually SOB, though this is different then her original SOB that seemed to bother her at night.  That particular symptom on 40mg  of lasix has resolved, se denies DOE or exertional symptoms at all, when she is up and about/busy or distracted she doesn't feel it, but in the afternoon when trying to relax is when she has felt this need to take a few breaths to feel like she is getting enough air, sometimes this takes a few minutes to settle.  She is up a couple pounds but does nt perceive water retention.   She explains to me that she suffered for 3 months early this year with her IBS and lost several pounds, and is finally eating better and thinks this is finally where her gain is from.  She denies any kind of CP or palpitations, no near syncope or syncope.  She inquires about anxiety as a source of her breathlessness, explaining that her life has been very busy, and seemed to take her a long time to start to feel better after her 3 mo IBS issues, and family trips, events and so on.  She denies any bleeding or signs of bleeding. She was felt to be euvolemic and discussed balance between diuretic and adequate hydration  She was  seen 05/2018 via tele health by Dr. Caryl Comes, se was feeling well, no symptoms of volume OL.  He  discussed jan 2020 hospitalized with elecrolyte imbalance, hyponatremia, her thiazide diuretic stopped, and also felt 2/2 diarrhea/GI illness as well. Planned to repeat labs w/hx of hyperkalemia as well.Marland Kitchen NSVT (preumably by remote pacer check, planned to check mag as well.  She called 5/10 w/CP and SOB, her PMD recommended ER visit, though she preferred to come in and be seen, given today's appt. (I see she had a virtual visit with her PMD 06/12/2019, no complaints of CP mentioned in the note), planned to update labs, her levothyroxine dose was reduced  Last TTE, 2018, LVEF  60-65%, mild LVH, Mod MR, LA and RA severely dilated, mod-severe TR, PA 41mmhg  06/20/19, I saw her She mentions it seems that since her synthroid dose was changed she has felt more SOB.  She has discussed with her PMD, and he did not think there was a correlation.  She has no daytime rest SOB, but does feel at night intermittently she feels a little SOB.  Once she had to sit up to feel better, felt a heaviness in her chest that was alarming to her, this passed quickly, but took a few pillows to feel comfortable to hours to feel comfortable to get back to sleep. No recurrent chest heaviness, no exertional CP, no dizziness, near syncope or syncope. She weighs daily with stable weights, she does nofeel swollen, or bloated Her son had a [place] fire, she was very worried about him and mentions that they tend to "but heads" and this is very stressful for her.  In hindsight she thinks this may have been the trigger She bruises easily, denies bleeding or signs of bleeding  She was not inclined to pursue ischemic evaluation at her age, planned to update her echo to guide management, her lasix to BID for 2 days then daily and advised to reduce her K+ in 1/2 given tendency towards hyperkalemia  Echo noted LVEF 50-55%, Apical hypokinesis (possibly related to pacing); overall low normal LV  systolic function; trace AI; mild MR; severe biatrial enlargement;  moderate to severe TR; mild RVE; small pericardial effusion.   TODAY She is doing well, no ongoing SOB, no CP, palpitations or cardiac awareness.  No dizzy spells, near syncope or syncope. She denies bleeding or signs of bleeding.   DEVICE information:  MDT dual chamber PPM, implanted 10/30/1998 with gen change 02/26/15, Dr. Caryl Comes, [place]    Past Medical History:  Diagnosis Date  . Atrial fibrillation ([place])    pacemaker, chronic anticoag  . Breast cancer ([place])   . Diverticulosis   . Fibroma    inner lips/mouth  . GERD  (gastroesophageal reflux disease)   . Hx of breast cancer   . Hyperkalemia   . Hypertension   . Hypothyroidism   . IBS (irritable bowel syndrome)   . Internal hemorrhoids   . Left inguinal hernia    direct  . MVP (mitral valve prolapse)   . Osteoporosis   . Renal insufficiency   . Thyroid cancer [facility])     Past Surgical History:  Procedure Laterality Date  . COLONOSCOPY  10/28/2005   internal hemorrhoids, diverticulosis (same as in 2002 and random bxs negative then)  . EP IMPLANTABLE DEVICE N/A 02/26/2015   Procedure: PPM Generator Changeout;  Surgeon: [name], MD;  Location: [facility];  Service: Cardiovascular;  Laterality: N/A;  . MASTECTOMY  1982   Bilateral  . PACEMAKER INSERTION    . THYROIDECTOMY    . TONSILLECTOMY    .  UPPER GASTROINTESTINAL ENDOSCOPY  09/06/2000   normal    Current Outpatient Medications  Medication Sig Dispense Refill  . acetaminophen (TYLENOL ARTHRITIS PAIN) 650 MG CR tablet Take 1,300 mg by mouth 2 (two) times daily.      Marland Kitchen azelastine (ASTELIN) 0.1 % nasal spray Place 1 spray into both nostrils 2 (two) times daily. Use in each nostril as directed 90 mL 1  . calcium-vitamin D (OSCAL WITH D) 500-200 MG-UNIT tablet Take 1 tablet by mouth 2 (two) times daily. 180 tablet 1  . clonazePAM (KLONOPIN) 0.5 MG tablet Take 1 tablet by mouth twice daily as needed for anxiety 180 tablet 1  . Eluxadoline (VIBERZI) 75 MG TABS Take 1 tablet by mouth daily. 56 tablet 0  . furosemide (LASIX) 40 MG tablet Take 1 tablet by mouth once daily 90 tablet 0  . levothyroxine (EUTHYROX) 50 MCG tablet Take 1 tablet (50 mcg total) by mouth daily before breakfast. 90 tablet 1  . montelukast (SINGULAIR) 10 MG tablet Take 1 tablet (10 mg total) by mouth at bedtime. 90 tablet 1  . potassium chloride SA (KLOR-CON M20) 20 MEQ tablet Take 0.5 tablets (10 mEq total) by mouth daily. 90 tablet 3  . triamcinolone (NASACORT) 55 MCG/ACT AERO nasal inhaler Place 2 sprays into the  nose daily. 32.4 mL 1  . warfarin (COUMADIN) 3 MG tablet Take 1 tablet daily except 1/2 tablet on Mondays and Fridays or as directed by Anticoagulation Clinic. 90 tablet 0   No current facility-administered medications for this visit.    Allergies:   Patient has no known allergies.   Social History:  The patient  reports that she quit smoking about 65 years ago. She has never used smokeless tobacco. She reports that she does not drink alcohol and does not use drugs.   Family History:  The patient's family history includes Arthritis in an other family member; Colon cancer (age of onset: 45) in her father; Heart disease in her father; Hypertension in an other family member; Stroke in her mother.  ROS:  Please see the history of present illness.  All other systems are reviewed and otherwise negative.   PHYSICAL EXAM: VS:  There were no vitals taken for this visit. BMI: There is no height or weight on file to calculate BMI. Well nourished, thin, though well developed, in no acute distress  HEENT: normocephalic, atraumatic  Neck: no JVD, carotid bruits or masses Cardiac:   RRR; no significant murmurs, no rubs, or gallops Lungs:  CTA b/l, no wheezing, rhonchi or rales  Abd: soft, nontender MS: no deformity, age appropriate atrophy Ext:  Trace if any edema, numerous spider veins b/l Skin: warm and dry, no rash Neuro:  No gross deficits appreciated Psych: euthymic mood, full affect  PPM site is stable, no tethering or discomfort, skin is intact, no threatening errosion, (she is very thin)   EKG:  Not done today   PPM interrogation today reviewed by myself:  Battery and lead measurements are good Programmed VVIR No HVR  06/20/19 Battery and lead measurements are good She has had some HVR episodes Most with markers available only Longest 12 seonds (twice) 03/08/2018 and 04/18/19 04/18/19  Has SEGM is more irregular and suspect brief RVR EGM avail for 02/26/19 is more clearly AF She  has h/o NSVT   07/03/2019: TTE 1. Apical hypokinesis (possibly related to pacing); overall low normal LV  systolic function; trace AI; mild MR; severe biatrial enlargement;  moderate to severe TR;  mild RVE; small pericardial effusion.  2. Left ventricular ejection fraction, by estimation, is 50 to 55%. The  left ventricle has low normal function. The left ventricle demonstrates  regional wall motion abnormalities (see scoring diagram/findings for  description). Left ventricular diastolic  parameters are indeterminate.  3. Right ventricular systolic function is normal. The right ventricular  size is mildly enlarged. There is normal pulmonary artery systolic  pressure.  4. Left atrial size was severely dilated.  5. Right atrial size was severely dilated.  6. The mitral valve is normal in structure. Mild mitral valve  regurgitation. No evidence of mitral stenosis.  7. Tricuspid valve regurgitation is moderate to severe.  8. The aortic valve is tricuspid. Aortic valve regurgitation is trivial.  No aortic stenosis is present.  9. The inferior vena cava is normal in size with greater than 50%  respiratory variability, suggesting right atrial pressure of 3 mmHg.   Comparison(s): 09/28/16 EF 60-65%. PA pressure 92mmHg.     09/28/2016: TTE Study Conclusions  - Left ventricle: The cavity size was normal. Wall thickness was  increased in a pattern of mild LVH. Systolic function was normal.  The estimated ejection fraction was in the range of 60% to 65%.  - Mitral valve: Mild systolic bowing without prolapse. There was  moderate regurgitation. Valve area by pressure half-time: 2.2  cm^2.  - Left atrium: The atrium was severely dilated.  - Right atrium: The atrium was severely dilated.  - Tricuspid valve: There was moderate-severe regurgitation.  - Pulmonary arteries: Systolic pressure was mildly increased. PA  peak pressure: 32 mm Hg (S).    05/01/15: TTE Study  Conclusions - Left ventricle: The cavity size was normal. Wall thickness was   normal. Systolic function was normal. The estimated ejection   fraction was in the range of 55% to 60%. Wall motion was normal;   there were no regional wall motion abnormalities. The study is   not technically sufficient to allow evaluation of LV diastolic   function. - Mitral valve: Calcified annulus. - Left atrium: The atrium was severely dilated. 28mm - Right ventricle: The cavity size was mildly dilated. - Right atrium: The atrium was severely dilated. - Tricuspid valve: There was severe regurgitation. - Pulmonary arteries: Systolic pressure was moderately increased.   PA peak pressure: 52 mm Hg (S). - Pericardium, extracardiac: A small pericardial effusion was   identified. Impressions: - Normal LV function; severe biatrial enlargement; mild RVE; severe   TR with moderately elevated pulmonary pressure; small pericardial   effusion.  Recent Labs: 12/28/2018: Magnesium 2.0 06/12/2019: BUN 47; Creatinine, Ser 1.38; Hemoglobin 12.2; Platelets 166.0; Potassium 5.0; Sodium 139; TSH 0.24  No results found for requested labs within last 8760 hours.   CrCl cannot be calculated (Patient's most recent lab result is older than the maximum 21 days allowed.).   Wt Readings from Last 3 Encounters:  06/20/19 123 lb (55.8 kg)  06/12/19 122 lb (55.3 kg)  06/12/19 122 lb 12.8 oz (55.7 kg)     Other studies reviewed: Additional studies/records reviewed today include: summarized above  ASSESSMENT AND PLAN:  1. CHB/PPM     normal device function, no changes made   2. Permanent Afib     CHA2DS2Vasc is at least 3 on warfarin, monitored and managed with the coumadin clinic       3. HFpEF     No symptoms or exam findings to suggest volume OL     Echo reads very similar to  last several years, biatrial enlargement and TR   4. CP     Not recurrent     Discussed with her, she is not inclined to pursue ischemic  work up at her age        Disposition: BMET today on lower K+ replacement, otherwise remotes Q 11mo, and in clinic in 70mo, sooner if needed  Current medicines are reviewed at length with the patient today.  The patient did not have any concerns regarding medicines.  Haywood Lasso, PA-C 08/13/2019 4:44 PM     [address] [place] [id number] [phone] (office)  [phone] (fax)

## 2019-08-14 ENCOUNTER — Other Ambulatory Visit: Payer: Self-pay

## 2019-08-14 ENCOUNTER — Encounter: Payer: Self-pay | Admitting: Internal Medicine

## 2019-08-14 ENCOUNTER — Ambulatory Visit (INDEPENDENT_AMBULATORY_CARE_PROVIDER_SITE_OTHER): Payer: Medicare Other | Admitting: Internal Medicine

## 2019-08-14 VITALS — BP 120/60 | HR 84 | Temp 97.8°F | Ht 66.75 in | Wt 126.0 lb

## 2019-08-14 DIAGNOSIS — E039 Hypothyroidism, unspecified: Secondary | ICD-10-CM

## 2019-08-14 LAB — TSH: TSH: 4.98 u[IU]/mL — ABNORMAL HIGH (ref 0.35–4.50)

## 2019-08-14 NOTE — Patient Instructions (Signed)
Hypothyroidism  Hypothyroidism is when the thyroid gland does not make enough of certain hormones (it is underactive). The thyroid gland is a small gland located in the lower front part of the neck, just in front of the windpipe (trachea). This gland makes hormones that help control how the body uses food for energy (metabolism) as well as how the heart and brain function. These hormones also play a role in keeping your bones strong. When the thyroid is underactive, it produces too little of the hormones thyroxine (T4) and triiodothyronine (T3). What are the causes? This condition may be caused by:  Hashimoto's disease. This is a disease in which the body's disease-fighting system (immune system) attacks the thyroid gland. This is the most common cause.  Viral infections.  Pregnancy.  Certain medicines.  Birth defects.  Past radiation treatments to the head or neck for cancer.  Past treatment with radioactive iodine.  Past exposure to radiation in the environment.  Past surgical removal of part or all of the thyroid.  Problems with a gland in the center of the brain (pituitary gland).  Lack of enough iodine in the diet. What increases the risk? You are more likely to develop this condition if:  You are female.  You have a family history of thyroid conditions.  You use a medicine called lithium.  You take medicines that affect the immune system (immunosuppressants). What are the signs or symptoms? Symptoms of this condition include:  Feeling as though you have no energy (lethargy).  Not being able to tolerate cold.  Weight gain that is not explained by a change in diet or exercise habits.  Lack of appetite.  Dry skin.  Coarse hair.  Menstrual irregularity.  Slowing of thought processes.  Constipation.  Sadness or depression. How is this diagnosed? This condition may be diagnosed based on:  Your symptoms, your medical history, and a physical exam.  Blood  tests. You may also have imaging tests, such as an ultrasound or MRI. How is this treated? This condition is treated with medicine that replaces the thyroid hormones that your body does not make. After you begin treatment, it may take several weeks for symptoms to go away. Follow these instructions at home:  Take over-the-counter and prescription medicines only as told by your health care provider.  If you start taking any new medicines, tell your health care provider.  Keep all follow-up visits as told by your health care provider. This is important. ? As your condition improves, your dosage of thyroid hormone medicine may change. ? You will need to have blood tests regularly so that your health care provider can monitor your condition. Contact a health care provider if:  Your symptoms do not get better with treatment.  You are taking thyroid replacement medicine and you: ? Sweat a lot. ? Have tremors. ? Feel anxious. ? Lose weight rapidly. ? Cannot tolerate heat. ? Have emotional swings. ? Have diarrhea. ? Feel weak. Get help right away if you have:  Chest pain.  An irregular heartbeat.  A rapid heartbeat.  Difficulty breathing. Summary  Hypothyroidism is when the thyroid gland does not make enough of certain hormones (it is underactive).  When the thyroid is underactive, it produces too little of the hormones thyroxine (T4) and triiodothyronine (T3).  The most common cause is Hashimoto's disease, a disease in which the body's disease-fighting system (immune system) attacks the thyroid gland. The condition can also be caused by viral infections, medicine, pregnancy, or past   radiation treatment to the head or neck.  Symptoms may include weight gain, dry skin, constipation, feeling as though you do not have energy, and not being able to tolerate cold.  This condition is treated with medicine to replace the thyroid hormones that your body does not make. This information  is not intended to replace advice given to you by your health care provider. Make sure you discuss any questions you have with your health care provider. Document Revised: 01/07/2017 Document Reviewed: 01/05/2017 Elsevier Patient Education  2020 [employer]  

## 2019-08-14 NOTE — Progress Notes (Signed)
Subjective:  Patient ID: Alyssa [name], female    DOB: [birth year]  Age: 86 y.o. MRN: [id number]  CC: Hypothyroidism  This visit occurred during the SARS-CoV-2 public health emergency.  Safety protocols were in place, including screening questions prior to the visit, additional usage of staff PPE, and extensive cleaning of exam room while observing appropriate contact time as indicated for disinfecting solutions.    HPI Alyssa [name] presents for f/up - She feels well on the current dose of levothyroxine.  She denies weight changes, sleep disturbance, edema, fatigue, or abnormal bowel habits.  Outpatient Medications Prior to Visit  Medication Sig Dispense Refill  . acetaminophen (TYLENOL ARTHRITIS PAIN) 650 MG CR tablet Take 1,300 mg by mouth 2 (two) times daily.      . clonazePAM (KLONOPIN) 0.5 MG tablet Take 1 tablet by mouth twice daily as needed for anxiety 180 tablet 1  . Eluxadoline (VIBERZI) 75 MG TABS Take 1 tablet by mouth daily. 56 tablet 0  . furosemide (LASIX) 40 MG tablet Take 1 tablet by mouth once daily 90 tablet 0  . levothyroxine (EUTHYROX) 50 MCG tablet Take 1 tablet (50 mcg total) by mouth daily before breakfast. 90 tablet 1  . potassium chloride SA (KLOR-CON M20) 20 MEQ tablet Take 0.5 tablets (10 mEq total) by mouth daily. 90 tablet 3  . warfarin (COUMADIN) 3 MG tablet Take 1 tablet daily except 1/2 tablet on Mondays and Fridays or as directed by Anticoagulation Clinic. 90 tablet 0  . azelastine (ASTELIN) 0.1 % nasal spray Place 1 spray into both nostrils 2 (two) times daily. Use in each nostril as directed 90 mL 1  . calcium-vitamin D (OSCAL WITH D) 500-200 MG-UNIT tablet Take 1 tablet by mouth 2 (two) times daily. 180 tablet 1  . montelukast (SINGULAIR) 10 MG tablet Take 1 tablet (10 mg total) by mouth at bedtime. 90 tablet 1  . triamcinolone (NASACORT) 55 MCG/ACT AERO nasal inhaler Place 2 sprays into the nose daily. 32.4 mL 1   No facility-administered medications  prior to visit.    ROS Review of Systems  Constitutional: Negative for appetite change, diaphoresis and fatigue.  HENT: Negative.   Eyes: Negative.   Respiratory: Negative for cough, chest tightness, shortness of breath and wheezing.   Cardiovascular: Negative for chest pain, palpitations and leg swelling.  Gastrointestinal: Negative for abdominal pain, constipation, diarrhea, nausea and vomiting.  Endocrine: Negative for cold intolerance and heat intolerance.  Genitourinary: Negative.  Negative for difficulty urinating.  Musculoskeletal: Negative.  Negative for arthralgias.  Skin: Negative.   Neurological: Negative for dizziness, weakness and headaches.  Hematological: Negative for adenopathy. Does not bruise/bleed easily.  Psychiatric/Behavioral: Negative.     Objective:  BP 120/60 (BP Location: Left Arm, Patient Position: Sitting, Cuff Size: Normal)   Pulse 84   Temp 97.8 F (36.6 C) (Oral)   Ht 5' 6.75" (1.695 m)   Wt 126 lb (57.2 kg)   SpO2 93%   BMI 19.88 kg/m   BP Readings from Last 3 Encounters:  08/15/19 (!) 110/56  08/14/19 120/60  06/20/19 136/64    Wt Readings from Last 3 Encounters:  08/15/19 123 lb (55.8 kg)  08/14/19 126 lb (57.2 kg)  06/20/19 123 lb (55.8 kg)    Physical Exam Vitals reviewed.  Constitutional:      Appearance: Normal appearance.  HENT:     Nose: Nose normal.     Mouth/Throat:     Mouth: Mucous membranes are moist.  Eyes:     General: No scleral icterus.    Conjunctiva/sclera: Conjunctivae normal.  Cardiovascular:     Rate and Rhythm: Normal rate and regular rhythm.     Heart sounds: No murmur heard.   Pulmonary:     Effort: Pulmonary effort is normal.     Breath sounds: No stridor. No wheezing, rhonchi or rales.  Abdominal:     General: Abdomen is flat.     Palpations: There is no mass.     Tenderness: There is no abdominal tenderness. There is no guarding.  Musculoskeletal:        General: Normal range of motion.      Cervical back: Neck supple.     Right lower leg: No edema.     Left lower leg: No edema.  Lymphadenopathy:     Cervical: No cervical adenopathy.  Skin:    General: Skin is warm and dry.  Neurological:     General: No focal deficit present.     Mental Status: She is alert.  Psychiatric:        Mood and Affect: Mood normal.        Behavior: Behavior normal.     Lab Results  Component Value Date   WBC 4.2 06/12/2019   HGB 12.2 06/12/2019   HCT 36.5 06/12/2019   PLT 166.0 06/12/2019   GLUCOSE 95 06/12/2019   CHOL 155 02/11/2014   TRIG 84.0 02/11/2014   HDL 46.10 02/11/2014   LDLCALC 92 02/11/2014   ALT 19 02/21/2018   AST 23 02/21/2018   NA 139 06/12/2019   K 5.0 06/12/2019   CL 104 06/12/2019   CREATININE 1.38 (H) 06/12/2019   BUN 47 (H) 06/12/2019   CO2 30 06/12/2019   TSH 4.98 (H) 08/14/2019   INR 2.4 08/15/2019   HGBA1C 5.7 06/12/2019    DG Thoracic Spine W/Swimmers  Result Date: 12/28/2018 CLINICAL DATA:  Mid back pain and lower back pain since a twisting injury 2 months ago. Left leg pain and tingling. EXAM: THORACIC SPINE - 3 VIEWS COMPARISON:  Chest x-ray dated 02/20/2018 and CT scan of the chest dated 07/04/2016 FINDINGS: There is an old healed compression fracture of the superior aspect of T12, unchanged since 05/04/2016. The remainder of the thoracic spine appears normal. IMPRESSION: No acute abnormality of the thoracic spine. Old healed compression fracture of T12, stable. Electronically Signed   By: Lorriane Shire M.D.   On: 12/28/2018 11:14   DG Lumbar Spine Complete  Result Date: 12/28/2018 CLINICAL DATA:  Mid and lower back pain since a twisting injury 2 months ago. Left leg pain and tingling. EXAM: LUMBAR SPINE - COMPLETE 4+ VIEW COMPARISON:  CT scan of the abdomen and pelvis dated 08/29/2014 FINDINGS: There is no evidence of a lumbar spine fracture or bone destruction. Chronic disc space narrowing and disc degeneration at L2-3 and L3-4 and L5-S1. Lateral  alignment is normal. Moderate bilateral facet arthritis at L4-5 and L5-S1. Minimal lumbar scoliosis with convexity to the right centered at L3. Old healed mild compression fracture of T12, stable since 2016. Aortic atherosclerosis. IMPRESSION: 1. No acute abnormality of the lumbar spine. 2. 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 3. Old healed compression fracture of T12. 4.  Aortic Atherosclerosis (ICD10-I70.0). Electronically Signed   By: Lorriane Shire M.D.   On: 12/28/2018 11:16    Assessment & Plan:   Anye was seen today for hypothyroidism.  Diagnoses and all orders for this visit:  Acquired hypothyroidism- Considering her age her TSH is in the acceptable range.  She clinically appears euthyroid.  Wwill continue the current dose of levothyroxine. -     TSH; Future -     TSH   I have discontinued York Cerise T. Marcon's triamcinolone, montelukast, azelastine, and calcium-vitamin D. I am also having her maintain her acetaminophen, Viberzi, clonazePAM, warfarin, levothyroxine, potassium chloride SA, and furosemide.  No orders of the defined types were placed in this encounter.    Follow-up: Return in about 6 months (around 02/14/2020).  [name], MD

## 2019-08-15 ENCOUNTER — Ambulatory Visit (INDEPENDENT_AMBULATORY_CARE_PROVIDER_SITE_OTHER): Payer: Medicare Other | Admitting: *Deleted

## 2019-08-15 ENCOUNTER — Ambulatory Visit (INDEPENDENT_AMBULATORY_CARE_PROVIDER_SITE_OTHER): Payer: Medicare Other | Admitting: Physician Assistant

## 2019-08-15 VITALS — BP 110/56 | HR 85 | Ht 66.75 in | Wt 123.0 lb

## 2019-08-15 DIAGNOSIS — I480 Paroxysmal atrial fibrillation: Secondary | ICD-10-CM | POA: Diagnosis not present

## 2019-08-15 DIAGNOSIS — I4891 Unspecified atrial fibrillation: Secondary | ICD-10-CM | POA: Diagnosis not present

## 2019-08-15 DIAGNOSIS — I5032 Chronic diastolic (congestive) heart failure: Secondary | ICD-10-CM | POA: Diagnosis not present

## 2019-08-15 DIAGNOSIS — Z79899 Other long term (current) drug therapy: Secondary | ICD-10-CM

## 2019-08-15 DIAGNOSIS — Z5181 Encounter for therapeutic drug level monitoring: Secondary | ICD-10-CM | POA: Diagnosis not present

## 2019-08-15 DIAGNOSIS — I4821 Permanent atrial fibrillation: Secondary | ICD-10-CM | POA: Diagnosis not present

## 2019-08-15 DIAGNOSIS — Z950 Presence of cardiac pacemaker: Secondary | ICD-10-CM

## 2019-08-15 DIAGNOSIS — I442 Atrioventricular block, complete: Secondary | ICD-10-CM | POA: Diagnosis not present

## 2019-08-15 LAB — POCT INR: INR: 2.4 (ref 2.0–3.0)

## 2019-08-15 NOTE — Patient Instructions (Signed)
Description   Continue to take 1 tablet every day except for 1/2 tablet on Mondays and Fridays. Recheck in 6 weeks. Call with any new or different medications [phone]

## 2019-08-15 NOTE — Patient Instructions (Signed)
Medication Instructions:  Your physician recommends that you continue on your current medications as directed. Please refer to the Current Medication list given to you today.  *If you need a refill on your cardiac medications before your next appointment, please call your pharmacy*   Lab Work:   If you have labs (blood work) drawn today and your tests are completely normal, you will receive your results only by: Marland Kitchen MyChart Message (if you have MyChart) OR . A paper copy in the mail If you have any lab test that is abnormal or we need to change your treatment, we will call you to review the results.   Testing/Procedures: NONE ORDERED  TODAY   Follow-Up: At [facility], you and your health needs are our priority.  As part of our continuing mission to provide you with exceptional heart care, we have created designated Provider Care Teams.  These Care Teams include your primary Cardiologist (physician) and Advanced Practice Providers (APPs -  Physician Assistants and Nurse Practitioners) who all work together to provide you with the care you need, when you need it.  We recommend signing up for the patient portal called "MyChart".  Sign up information is provided on this After Visit Summary.  MyChart is used to connect with patients for Virtual Visits (Telemedicine).  Patients are able to view lab/test results, encounter notes, upcoming appointments, etc.  Non-urgent messages can be sent to your provider as well.   To learn more about what you can do with MyChart, go to [email or web address].    Your next appointment:   6 month(s)  The format for your next appointment:   In Person  Provider:   You may see Dr.Klein  or one of the following Advanced Practice Providers on your designated Care Team:    Chanetta Marshall, NP  Tommye Standard, PA-C  Legrand Como "Oda [place], [place]   Other Instructions

## 2019-08-16 LAB — BASIC METABOLIC PANEL
BUN/Creatinine Ratio: 25 (ref 12–28)
BUN: 32 mg/dL (ref 10–36)
CO2: 26 mmol/L (ref 20–29)
Calcium: 8.8 mg/dL (ref 8.7–10.3)
Chloride: 103 mmol/L (ref 96–106)
Creatinine, Ser: 1.3 mg/dL — ABNORMAL HIGH (ref 0.57–1.00)
GFR calc Af Amer: 41 mL/min/{1.73_m2} — ABNORMAL LOW (ref >59)
GFR calc non Af Amer: 35 mL/min/{1.73_m2} — ABNORMAL LOW (ref >59)
Glucose: 103 mg/dL — ABNORMAL HIGH (ref 65–99)
Potassium: 4.5 mmol/L (ref 3.5–5.2)
Sodium: 142 mmol/L (ref 134–144)

## 2019-09-05 NOTE — Chronic Care Management (AMB) (Signed)
Chronic Care Management Pharmacy  Name: Alyssa [name]  MRN: [id number] DOB: March 10, 1924   Chief Complaint/ HPI  Alyssa [name],  86 y.o. , female presents for their Initial CCM visit with the clinical pharmacist via telephone due to COVID-19 Pandemic.  PCP : [name], MD Patient Care Team: [name], MD as PCP - General [name], MD as PCP - Cardiology (Cardiology) [name], MD (Cardiology) [name], [facility] as Pharmacist (Pharmacist)  Their chronic conditions include: Hypertension, Atrial Fibrillation, Heart Failure, GERD, Chronic Kidney Disease, Hypothyroidism, Anxiety, Osteoporosis, Allergic Rhinitis and IBS, severe triscuspid regurgitation, CHB w/ PPM  Office Visits: 08/14/19 Dr [name] OV: f/u thyroid for leg cramps. TSH wnl, no med changes.  06/12/19 Dr [name] OV: TSH low, decreased levothyroxine to 50 mcg daily.  05/09/19 Dr [name] OV: worsening low back pain, X-ray w/o abnormalities, no med changes.  Consult Visit: 08/15/19 PA Tommye Standard (cardiology): f/u permanent Afib, CHB w/ PPM; c/o chest pain, pt declined ischemic workup  06/20/19 PA Tommye Standard (cardiology): take lasix BID x 2 days then return to once a day. Reduce potassium to 1/2 dose - 10 mEq  No Known Allergies  Medications: Outpatient Encounter Medications as of 09/06/2019  Medication Sig  . acetaminophen (TYLENOL ARTHRITIS PAIN) 650 MG CR tablet Take 1,300 mg by mouth 2 (two) times daily.    . clonazePAM (KLONOPIN) 0.5 MG tablet Take 1 tablet by mouth twice daily as needed for anxiety  . furosemide (LASIX) 40 MG tablet Take 1 tablet by mouth once daily  . levothyroxine (EUTHYROX) 50 MCG tablet Take 1 tablet (50 mcg total) by mouth daily before breakfast.  . potassium chloride SA (KLOR-CON M20) 20 MEQ tablet Take 0.5 tablets (10 mEq total) by mouth daily.  Marland Kitchen warfarin (COUMADIN) 3 MG tablet Take 1 tablet daily except 1/2 tablet on Mondays and Fridays or as directed by Anticoagulation  Clinic.  Marland Kitchen Eluxadoline (VIBERZI) 75 MG TABS Take 1 tablet by mouth daily. (Patient not taking: Reported on 09/06/2019)   No facility-administered encounter medications on file as of 09/06/2019.     Current Diagnosis/Assessment:  SDOH Interventions     Most Recent Value  SDOH Interventions  Financial Strain Interventions Intervention Not Indicated      Goals Addressed            This Visit's Progress   . Pharmacy Care Plan       CARE PLAN ENTRY (see longitudinal plan of care for additional care plan information)  Current Barriers:  . Chronic Disease Management support, education, and care coordination needs related to Hypertension, Atrial Fibrillation, Heart Failure, Hypothyroidism, and Osteoarthritis   Hypertension  / Heart Failure BP Readings from Last 3 Encounters:  08/15/19 (!) 110/56  08/14/19 120/60  06/20/19 136/64 .  Pharmacist Clinical Goal(s): o Over the next 180 days, patient will work with PharmD and providers to maintain BP goal <140/90 . Current regimen:  o Furosemide 40 mg daily o Potassium chloride 20 mEq - 1/2 tab daily . Interventions: o Discussed use of furosemide and potassium together to prevent low potassium . Patient self care activities - Over the next 180 days, patient will: o Continue medication as prescribed o Ensure daily salt intake < 2300 mg/day  Atrial fibrillation (permanent, with valvular disease) . Pharmacist Clinical Goal(s) o Over the next 180 days, patient will work with PharmD and providers to optimize therapy . Current regimen:  o Warfarin 3 mg as directed .  Interventions: o Discussed potential for bleeding with warfarin . Patient self care activities - Over the next 180 days, patient will: o Continue medication as prescribed o Monitor for signs/symptoms of bleeding  Hypothyroidism . Pharmacist Clinical Goal(s) o Over the next 180 days, patient will work with PharmD and providers to optimize therapy . Current regimen:   o Levothyroxine 50 mcg daily . Interventions: o Counseled to take levothyroxine 30-60 min before food or other medications to optimize absorption . Patient self care activities - Over the next 180 days, patient will: o Take levothyroxine as above  Arthritis . Pharmacist Clinical Goal(s) o Over the next 180 days, patient will work with PharmD and providers to optimize therapy . Current regimen:  o Tylenol Arthritis 650 mg - 2 tablets every 8 hours . Interventions: o Discussed maximum daily dose of Tylenol 2000 mg (due to interaction with warfarin) . Patient self care activities - Over the next 180 days, patient will: o Reduce Tylenol dose to 650 mg - 2 tablets twice a day  Medication management . Pharmacist Clinical Goal(s): o Over the next 180 days, patient will work with PharmD and providers to maintain optimal medication adherence . Current pharmacy: [facility] . Interventions o Comprehensive medication review performed. o Continue current medication management strategy . Patient self care activities - Over the next 180 days, patient will: o Focus on medication adherence by pill box o Take medications as prescribed o Report any questions or concerns to PharmD and/or provider(s)  Initial goal documentation       Heart Failure / Hypertension   Type: HFpEF Last ejection fraction: 50-55% (07/03/2019)  BP goal is:  <140/90 BP Readings from Last 3 Encounters:  08/15/19 (!) 110/56  08/14/19 120/60  06/20/19 136/64   Kidney Function Lab Results  Component Value Date/Time   CREATININE 1.30 (H) 08/15/2019 02:54 PM   CREATININE 1.38 (H) 06/12/2019 03:15 PM   CREATININE 1.10 (H) 02/20/2015 02:47 PM   GFR 35.67 (L) 06/12/2019 03:15 PM   GFRNONAA 35 (L) 08/15/2019 02:54 PM   GFRAA 41 (L) 08/15/2019 02:54 PM   K 4.5 08/15/2019 02:54 PM   K 5.0 06/12/2019 03:15 PM   Patient has failed these meds in past: n/a Patient is currently controlled on the following medications:   . Furosemide 40 mg daily - AM  . Potassium chloride 20 mEq - 1/2 tab daily  We discussed: furosemide contributes to K loss; pt reports adherence and denies issues  Plan  Continue current medications   AFIB w/ severe triscupid regurgitation   Patient is currently rate controlled. Office heart rates are  Pulse Readings from Last 3 Encounters:  08/15/19 85  08/14/19 84  06/20/19 80   CHA2DS2-VASc Score = 5  The patient's score is based upon: CHF History: 1 HTN History: 1 Age : 2 Diabetes History: 0 Stroke History: 0 Vascular Disease History: 0 Gender: 1  Lab Results  Component Value Date/Time   INR 2.4 08/15/2019 02:26 PM   INR 2.0 07/03/2019 12:06 PM   INR 2.14 02/22/2018 06:00 AM   INR 2.94 02/21/2018 02:32 AM   INR 2.0 04/20/2010 02:24 PM   INR 2.3 03/23/2010 03:37 PM   Patient has failed these meds in past: n/a Patient is currently controlled on the following medications:  . Warfarin 3 mg as directed  We discussed: bleeding risk; pt denies s/sx of bleeding; INR consistently at goal; pt reports her diet is consistent  Plan  Continue current medications  Hypothyroidism  Lab Results  Component Value Date/Time   TSH 4.98 (H) 08/14/2019 02:39 PM   TSH 0.24 (L) 06/12/2019 03:15 PM   FREET4 1.47 02/21/2018 02:32 AM   Patient has failed these meds in past: n/a Patient is currently controlled on the following medications:  . Levothyroxine 50 mcg daily  We discussed: optimal dosing of levothyroxine on empty stomach separate from food/other meds. Pt reports she has been taking it with other meds, also reports she has been feeling more tired than usual since dose change.  Plan  Take levothyroxine separate from other meds  Anxiety   No flowsheet data found.  Patient has failed these meds in past: n/a Patient is currently controlled on the following medications:  . Clonazepam 0.5 mg BID prn  We discussed:  Pt reports she takes clonazepam daily at  bedtime; reports anxiety is controlled, denies issues  Plan  Continue current medications  IBS   Patient has failed these meds in past: n/a Patient is currently controlled on the following medications:  Marland Kitchen Viberzi 75 mg daily - not taking  We discussed:  Pt has not taken medication in at least a year, she denies IBS flares and has been doing well without medication  Plan  Continue current medications   Osteoporosis   Last DEXA Scan: 11/07/2007  T-Score total hip: -1.6  T-Score forearm radius: -3.1  VITD  Date Value Ref Range Status  10/27/2017 33.15 30.00 - 100.00 ng/mL Final    Patient is a candidate for pharmacologic treatment due to history of vertebral fracture  Patient has failed these meds in past: alendronate (last noted 2012) Patient is currently controlled on the following medications:  . No medication  We discussed:  Recommend 1200 mg of calcium daily from dietary and supplemental sources.  Plan  Continue control with diet and exercise  Osteoarthritis   Patient is currently controlled on the following medications:  . Tylenol 650 mg - 2 tab TID . Falgei CBD cream . Pain relieving gel (icy-hot)  We discussed: pt reports she has been taking 6 Tylenol 650 mg daily; discussed maximum daily dose of Tylenol is 2000 mg due to interaction with warfarin. Pt agreed to reduce dose as tolerated.    Plan  Continue current medications  Reduce Tylenol to 2000 mg /day  Health Maintenance   Patient is currently controlled on the following medications:  . Align - 2 daily . Biotin - 1 daily  We discussed:  Patient is satisfied with current OTC regimen and denies issues  Plan  Continue current medications  Medication Management   Pt uses [facility] for all medications Uses pill box? Yes Pt endorses 100% compliance  We discussed: [facility] preferred [facility] with insurance; pt reports all medications are covered; denies issues with pharmacy  services  Plan  Continue current medication management strategy    Follow up: 6 month phone visit  Charlene Brooke, PharmD, BCACP Clinical Pharmacist [facility] [phone]

## 2019-09-06 ENCOUNTER — Other Ambulatory Visit: Payer: Self-pay

## 2019-09-06 ENCOUNTER — Ambulatory Visit: Payer: Medicare Other | Admitting: Pharmacist

## 2019-09-06 DIAGNOSIS — E039 Hypothyroidism, unspecified: Secondary | ICD-10-CM

## 2019-09-06 DIAGNOSIS — I10 Essential (primary) hypertension: Secondary | ICD-10-CM

## 2019-09-06 DIAGNOSIS — I5032 Chronic diastolic (congestive) heart failure: Secondary | ICD-10-CM

## 2019-09-06 DIAGNOSIS — I480 Paroxysmal atrial fibrillation: Secondary | ICD-10-CM

## 2019-09-06 NOTE — Patient Instructions (Addendum)
Visit Information  Phone number for Pharmacist: [phone]  Thank you for meeting with me to discuss your medications! I look forward to working with you to achieve your health care goals. Below is a summary of what we talked about during the visit:  Goals Addressed            This Visit's Progress   . Pharmacy Care Plan       CARE PLAN ENTRY (see longitudinal plan of care for additional care plan information)  Current Barriers:  . Chronic Disease Management support, education, and care coordination needs related to Hypertension, Atrial Fibrillation, Heart Failure, Hypothyroidism, and Osteoarthritis   Hypertension  / Heart Failure BP Readings from Last 3 Encounters:  08/15/19 (!) 110/56  08/14/19 120/60  06/20/19 136/64 .  Pharmacist Clinical Goal(s): o Over the next 180 days, patient will work with PharmD and providers to maintain BP goal <140/90 . Current regimen:  o Furosemide 40 mg daily o Potassium chloride 20 mEq - 1/2 tab daily . Interventions: o Discussed use of furosemide and potassium together to prevent low potassium . Patient self care activities - Over the next 180 days, patient will: o Continue medication as prescribed o Ensure daily salt intake < 2300 mg/day  Atrial fibrillation (permanent, with valvular disease) . Pharmacist Clinical Goal(s) o Over the next 180 days, patient will work with PharmD and providers to optimize therapy . Current regimen:  o Warfarin 3 mg as directed . Interventions: o Discussed potential for bleeding with warfarin . Patient self care activities - Over the next 180 days, patient will: o Continue medication as prescribed o Monitor for signs/symptoms of bleeding  Hypothyroidism . Pharmacist Clinical Goal(s) o Over the next 180 days, patient will work with PharmD and providers to optimize therapy . Current regimen:  o Levothyroxine 50 mcg daily . Interventions: o Counseled to take levothyroxine 30-60 min before food or  other medications to optimize absorption . Patient self care activities - Over the next 180 days, patient will: o Take levothyroxine as above  Arthritis . Pharmacist Clinical Goal(s) o Over the next 180 days, patient will work with PharmD and providers to optimize therapy . Current regimen:  o Tylenol Arthritis 650 mg - 2 tablets every 8 hours . Interventions: o Discussed maximum daily dose of Tylenol 2000 mg (due to interaction with warfarin) . Patient self care activities - Over the next 180 days, patient will: o Reduce Tylenol dose to 650 mg - 2 tablets twice a day  Medication management . Pharmacist Clinical Goal(s): o Over the next 180 days, patient will work with PharmD and providers to maintain optimal medication adherence . Current pharmacy: [facility] . Interventions o Comprehensive medication review performed. o Continue current medication management strategy . Patient self care activities - Over the next 180 days, patient will: o Focus on medication adherence by pill box o Take medications as prescribed o Report any questions or concerns to PharmD and/or provider(s)  Initial goal documentation       Alyssa [name] was given information about Chronic Care Management services today including:  1. CCM service includes personalized support from designated clinical staff supervised by her physician, including individualized plan of care and coordination with other care providers 2. 24/7 contact phone numbers for assistance for urgent and routine care needs. 3. Standard insurance, coinsurance, copays and deductibles apply for chronic care management only during months in which we provide at least 20 minutes of these services. Most insurances cover these services  at 100%, however patients may be responsible for any copay, coinsurance and/or deductible if applicable. This service may help you avoid the need for more expensive face-to-face services. 4. Only one practitioner may furnish  and bill the service in a calendar month. 5. The patient may stop CCM services at any time (effective at the end of the month) by phone call to the office staff.  Patient agreed to services and verbal consent obtained.   The patient verbalized understanding of instructions provided today and agreed to receive a mailed copy of patient instruction and/or educational materials. Telephone follow up appointment with pharmacy team member scheduled for: 6 months  Charlene Brooke, PharmD Clinical Pharmacist [facility] After Age 55 After age 20, you are at a higher risk for certain long-term diseases and infections as well as injuries from falls. Falls are a major cause of broken bones and head injuries in people who are older than age 34. Getting regular preventive care can help to keep you healthy and well. Preventive care includes getting regular testing and making lifestyle changes as recommended by your health care provider. Talk with your health care provider about:  Which screenings and tests you should have. A screening is a test that checks for a disease when you have no symptoms.  A diet and exercise plan that is right for you. What should I know about screenings and tests to prevent falls? Screening and testing are the best ways to find a health problem early. Early diagnosis and treatment give you the best chance of managing medical conditions that are common after age 72. Certain conditions and lifestyle choices may make you more likely to have a fall. Your health care provider may recommend:  Regular vision checks. Poor vision and conditions such as cataracts can make you more likely to have a fall. If you wear glasses, make sure to get your prescription updated if your vision changes.  Medicine review. Work with your health care provider to regularly review all of the medicines you are taking, including over-the-counter  medicines. Ask your health care provider about any side effects that may make you more likely to have a fall. Tell your health care provider if any medicines that you take make you feel dizzy or sleepy.  Osteoporosis screening. Osteoporosis is a condition that causes the bones to get weaker. This can make the bones weak and cause them to break more easily.  Blood pressure screening. Blood pressure changes and medicines to control blood pressure can make you feel dizzy.  Strength and balance checks. Your health care provider may recommend certain tests to check your strength and balance while standing, walking, or changing positions.  Foot health exam. Foot pain and numbness, as well as not wearing proper footwear, can make you more likely to have a fall.  Depression screening. You may be more likely to have a fall if you have a fear of falling, feel emotionally low, or feel unable to do activities that you used to do.  Alcohol use screening. Using too much alcohol can affect your balance and may make you more likely to have a fall. What actions can I take to lower my risk of falls? General instructions  Talk with your health care provider about your risks for falling. Tell your health care provider if: ? You fall. Be sure to tell your health care provider about all falls, even ones that seem minor. ? You feel dizzy,  sleepy, or off-balance.  Take over-the-counter and prescription medicines only as told by your health care provider. These include any supplements.  Eat a healthy diet and maintain a healthy weight. A healthy diet includes low-fat dairy products, low-fat (lean) meats, and fiber from whole grains, beans, and lots of fruits and vegetables. Home safety  Remove any tripping hazards, such as rugs, cords, and clutter.  Install safety equipment such as grab bars in bathrooms and safety rails on stairs.  Keep rooms and walkways well-lit. Activity   Follow a regular exercise  program to stay fit. This will help you maintain your balance. Ask your health care provider what types of exercise are appropriate for you.  If you need a cane or walker, use it as recommended by your health care provider.  Wear supportive shoes that have nonskid soles. Lifestyle  Do not drink alcohol if your health care provider tells you not to drink.  If you drink alcohol, limit how much you have: ? 0-1 drink a day for women. ? 0-2 drinks a day for men.  Be aware of how much alcohol is in your drink. In the [place] one drink equals one typical bottle of beer (12 oz), one-half glass of wine (5 oz), or one shot of hard liquor (1 oz).  Do not use any products that contain nicotine or tobacco, such as cigarettes and e-cigarettes. If you need help quitting, ask your health care provider. Summary  Having a healthy lifestyle and getting preventive care can help to protect your health and wellness after age 41.  Screening and testing are the best way to find a health problem early and help you avoid having a fall. Early diagnosis and treatment give you the best chance for managing medical conditions that are more common for people who are older than age 80.  Falls are a major cause of broken bones and head injuries in people who are older than age 38. Take precautions to prevent a fall at home.  Work with your health care provider to learn what changes you can make to improve your health and wellness and to prevent falls. This information is not intended to replace advice given to you by your health care provider. Make sure you discuss any questions you have with your health care provider. Document Revised: 05/18/2018 Document Reviewed: 12/08/2016 Elsevier Patient Education  2020 [employer].

## 2019-09-07 NOTE — Addendum Note (Signed)
Addended by: Aviva Signs M on: 09/07/2019 02:19 PM   Modules accepted: Orders

## 2019-09-08 ENCOUNTER — Other Ambulatory Visit: Payer: Self-pay | Admitting: Internal Medicine

## 2019-09-14 ENCOUNTER — Telehealth: Payer: Self-pay | Admitting: Internal Medicine

## 2019-09-14 NOTE — Progress Notes (Signed)
°  Chronic Care Management   Outreach Note  09/14/2019 Name: Alyssa [name] MRN: [id number] DOB: 03-11-1924  Referred by: [name], MD Reason for referral : No chief complaint on file.   An unsuccessful telephone outreach was attempted today. The patient was referred to the pharmacist for assistance with care management and care coordination.   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

## 2019-09-20 ENCOUNTER — Ambulatory Visit (INDEPENDENT_AMBULATORY_CARE_PROVIDER_SITE_OTHER): Payer: Medicare Other | Admitting: *Deleted

## 2019-09-20 ENCOUNTER — Other Ambulatory Visit: Payer: Self-pay | Admitting: Internal Medicine

## 2019-09-20 DIAGNOSIS — I442 Atrioventricular block, complete: Secondary | ICD-10-CM

## 2019-09-20 LAB — CUP PACEART REMOTE DEVICE CHECK
Battery Impedance: 727 Ohm
Battery Remaining Longevity: 69 mo
Battery Voltage: 2.79 V
Brady Statistic RV Percent Paced: 99 %
Date Time Interrogation Session: 20210812080704
Implantable Lead Implant Date: 20000921
Implantable Lead Implant Date: 20000921
Implantable Lead Location: 753859
Implantable Lead Location: 753860
Implantable Pulse Generator Implant Date: 20170118
Lead Channel Impedance Value: 566 Ohm
Lead Channel Impedance Value: 67 Ohm
Lead Channel Pacing Threshold Amplitude: 1 V
Lead Channel Pacing Threshold Pulse Width: 0.4 ms
Lead Channel Setting Pacing Amplitude: 2.5 V
Lead Channel Setting Pacing Pulse Width: 0.4 ms
Lead Channel Setting Sensing Sensitivity: 2 mV

## 2019-09-24 NOTE — Progress Notes (Signed)
Remote pacemaker transmission.   

## 2019-09-26 ENCOUNTER — Other Ambulatory Visit: Payer: Self-pay

## 2019-09-26 ENCOUNTER — Ambulatory Visit (INDEPENDENT_AMBULATORY_CARE_PROVIDER_SITE_OTHER): Payer: Medicare Other | Admitting: *Deleted

## 2019-09-26 DIAGNOSIS — I4891 Unspecified atrial fibrillation: Secondary | ICD-10-CM

## 2019-09-26 DIAGNOSIS — Z5181 Encounter for therapeutic drug level monitoring: Secondary | ICD-10-CM

## 2019-09-26 LAB — POCT INR: INR: 1.9 — AB (ref 2.0–3.0)

## 2019-09-26 NOTE — Patient Instructions (Signed)
Description   Today take 1.5 tablets then continue taking Warfarin 1 tablet every day except for 1/2 tablet on Mondays and Fridays. Recheck in 6 weeks. Call with any new or different medications [phone]

## 2019-10-08 ENCOUNTER — Telehealth: Payer: Self-pay | Admitting: Internal Medicine

## 2019-10-08 NOTE — Telephone Encounter (Signed)
New Message:   Pt is calling and states she has enlarged big toe and she has made an appt for 10/10/19 but just wanted to let the MA know just in case they think she needs to be seen by someone else. Please advise.

## 2019-10-10 ENCOUNTER — Ambulatory Visit: Payer: Medicare Other | Admitting: Internal Medicine

## 2019-10-27 ENCOUNTER — Other Ambulatory Visit: Payer: Self-pay | Admitting: Internal Medicine

## 2019-10-29 ENCOUNTER — Other Ambulatory Visit: Payer: Self-pay | Admitting: Internal Medicine

## 2019-11-01 DIAGNOSIS — H5213 Myopia, bilateral: Secondary | ICD-10-CM | POA: Diagnosis not present

## 2019-11-07 ENCOUNTER — Ambulatory Visit (INDEPENDENT_AMBULATORY_CARE_PROVIDER_SITE_OTHER): Payer: Medicare Other | Admitting: *Deleted

## 2019-11-07 ENCOUNTER — Other Ambulatory Visit: Payer: Self-pay

## 2019-11-07 DIAGNOSIS — I4891 Unspecified atrial fibrillation: Secondary | ICD-10-CM | POA: Diagnosis not present

## 2019-11-07 DIAGNOSIS — Z5181 Encounter for therapeutic drug level monitoring: Secondary | ICD-10-CM | POA: Diagnosis not present

## 2019-11-07 LAB — POCT INR: INR: 1.6 — AB (ref 2.0–3.0)

## 2019-11-07 NOTE — Patient Instructions (Signed)
Description   Today take 1.5 tablets then start taking Warfarin 1 tablet every day except for 1/2 tablet only on Fridays. Recheck in 4 weeks. Call with any new or different medications [phone]

## 2019-11-13 ENCOUNTER — Other Ambulatory Visit: Payer: Self-pay

## 2019-11-13 ENCOUNTER — Ambulatory Visit (INDEPENDENT_AMBULATORY_CARE_PROVIDER_SITE_OTHER): Payer: Medicare Other | Admitting: Internal Medicine

## 2019-11-13 ENCOUNTER — Encounter: Payer: Self-pay | Admitting: Internal Medicine

## 2019-11-13 ENCOUNTER — Ambulatory Visit (INDEPENDENT_AMBULATORY_CARE_PROVIDER_SITE_OTHER): Payer: Medicare Other

## 2019-11-13 VITALS — BP 116/72 | HR 84 | Temp 98.4°F | Resp 16 | Ht 66.75 in | Wt 124.0 lb

## 2019-11-13 DIAGNOSIS — M7989 Other specified soft tissue disorders: Secondary | ICD-10-CM | POA: Diagnosis not present

## 2019-11-13 DIAGNOSIS — K580 Irritable bowel syndrome with diarrhea: Secondary | ICD-10-CM | POA: Diagnosis not present

## 2019-11-13 DIAGNOSIS — S91132A Puncture wound without foreign body of left great toe without damage to nail, initial encounter: Secondary | ICD-10-CM

## 2019-11-13 DIAGNOSIS — Z23 Encounter for immunization: Secondary | ICD-10-CM | POA: Diagnosis not present

## 2019-11-13 DIAGNOSIS — R109 Unspecified abdominal pain: Secondary | ICD-10-CM | POA: Diagnosis not present

## 2019-11-13 DIAGNOSIS — J984 Other disorders of lung: Secondary | ICD-10-CM | POA: Diagnosis not present

## 2019-11-13 DIAGNOSIS — M79672 Pain in left foot: Secondary | ICD-10-CM | POA: Diagnosis not present

## 2019-11-13 LAB — URINALYSIS, ROUTINE W REFLEX MICROSCOPIC
Bilirubin Urine: NEGATIVE
Hgb urine dipstick: NEGATIVE
Ketones, ur: NEGATIVE
Nitrite: NEGATIVE
Specific Gravity, Urine: 1.02 (ref 1.000–1.030)
Total Protein, Urine: NEGATIVE
Urine Glucose: NEGATIVE
Urobilinogen, UA: 0.2 (ref 0.0–1.0)
pH: 5.5 (ref 5.0–8.0)

## 2019-11-13 LAB — BASIC METABOLIC PANEL
BUN: 30 mg/dL — ABNORMAL HIGH (ref 6–23)
CO2: 30 mEq/L (ref 19–32)
Calcium: 9.3 mg/dL (ref 8.4–10.5)
Chloride: 101 mEq/L (ref 96–112)
Creatinine, Ser: 1.39 mg/dL — ABNORMAL HIGH (ref 0.40–1.20)
GFR: 32.47 mL/min — ABNORMAL LOW (ref >60.00)
Glucose, Bld: 96 mg/dL (ref 70–99)
Potassium: 4.3 mEq/L (ref 3.5–5.1)
Sodium: 140 mEq/L (ref 135–145)

## 2019-11-13 LAB — CBC WITH DIFFERENTIAL/PLATELET
Basophils Absolute: 0 10*3/uL (ref 0.0–0.1)
Basophils Relative: 0.9 % (ref 0.0–3.0)
Eosinophils Absolute: 0.1 10*3/uL (ref 0.0–0.7)
Eosinophils Relative: 1.3 % (ref 0.0–5.0)
HCT: 38.5 % (ref 36.0–46.0)
Hemoglobin: 12.9 g/dL (ref 12.0–15.0)
Lymphocytes Relative: 36.5 % (ref 12.0–46.0)
Lymphs Abs: 1.7 10*3/uL (ref 0.7–4.0)
MCHC: 33.4 g/dL (ref 30.0–36.0)
MCV: 94.2 fl (ref 78.0–100.0)
Monocytes Absolute: 0.3 10*3/uL (ref 0.1–1.0)
Monocytes Relative: 7 % (ref 3.0–12.0)
Neutro Abs: 2.6 10*3/uL (ref 1.4–7.7)
Neutrophils Relative %: 54.3 % (ref 43.0–77.0)
Platelets: 157 10*3/uL (ref 150.0–400.0)
RBC: 4.09 Mil/uL (ref 3.87–5.11)
RDW: 12.5 % (ref 11.5–15.5)
WBC: 4.7 10*3/uL (ref 4.0–10.5)

## 2019-11-13 LAB — HEPATIC FUNCTION PANEL
ALT: 18 U/L (ref 0–35)
AST: 21 U/L (ref 0–37)
Albumin: 4.8 g/dL (ref 3.5–5.2)
Alkaline Phosphatase: 50 U/L (ref 39–117)
Bilirubin, Direct: 0.1 mg/dL (ref 0.0–0.3)
Total Bilirubin: 0.6 mg/dL (ref 0.2–1.2)
Total Protein: 7.2 g/dL (ref 6.0–8.3)

## 2019-11-13 LAB — LIPASE: Lipase: 52 U/L (ref 11.0–59.0)

## 2019-11-13 NOTE — Progress Notes (Signed)
Subjective:  Patient ID: Alyssa [name], female    DOB: [birth year]  Age: 86 y.o. MRN: [id number]  CC: Flank Pain   This visit occurred during the SARS-CoV-2 public health emergency.  Safety protocols were in place, including screening questions prior to the visit, additional usage of staff PPE, and extensive cleaning of exam room while observing appropriate contact time as indicated for disinfecting solutions.   HPI Alyssa [name] presents for concerns about a 10-day history of pain in her left flank and left lower abdomen.  She describes it as an achy sensation that she only notices when she presses on the area.  She also complains that she stepped on a toothpick about 5 months ago and complains of persistent pain in her left foot over the first MTP joint.  Outpatient Medications Prior to Visit  Medication Sig Dispense Refill  . acetaminophen (TYLENOL ARTHRITIS PAIN) 650 MG CR tablet Take 1,300 mg by mouth 2 (two) times daily.      . bifidobacterium infantis (ALIGN) capsule Take 2 capsules by mouth daily.    . Biotin 1000 MCG tablet Take 1,000 mcg by mouth daily.    . clonazePAM (KLONOPIN) 0.5 MG tablet Take 1 tablet by mouth twice daily as needed for anxiety 180 tablet 1  . Eluxadoline (VIBERZI) 75 MG TABS Take 1 tablet by mouth daily. 56 tablet 0  . furosemide (LASIX) 40 MG tablet Take 1 tablet by mouth once daily 90 tablet 0  . levothyroxine (EUTHYROX) 50 MCG tablet Take 1 tablet (50 mcg total) by mouth daily before breakfast. 90 tablet 1  . Menthol, Topical Analgesic, (PAIN RELIEVING) 3.5 % GEL Apply topically.    Marland Kitchen MISC NATURAL PRODUCTS EX Apply topically. CBD cream for arthritis pain    . potassium chloride SA (KLOR-CON) 20 MEQ tablet Take 1 tablet by mouth once daily 90 tablet 0  . warfarin (COUMADIN) 3 MG tablet TAKE 1 TABLET BY MOUTH ONCE DAILY EXCEPT  1/2  ON  MONDAY  AND  FRIDAYS  OR  AS  DIRECTED  BY  ANTICOAGULATION  CLINIC 90 tablet 0   No facility-administered medications prior  to visit.    ROS Review of Systems  Constitutional: Negative for appetite change, diaphoresis, fatigue, fever and unexpected weight change.  HENT: Negative.  Negative for sore throat and trouble swallowing.   Eyes: Negative for visual disturbance.  Respiratory: Negative for cough, chest tightness, shortness of breath and wheezing.   Cardiovascular: Negative for chest pain, palpitations and leg swelling.  Gastrointestinal: Positive for abdominal pain. Negative for blood in stool, constipation, diarrhea, nausea, rectal pain and vomiting.  Genitourinary: Positive for flank pain. Negative for difficulty urinating, dysuria, hematuria and urgency.  Musculoskeletal: Negative for arthralgias and myalgias.  Skin: Negative.  Negative for color change, pallor and rash.  Neurological: Negative.  Negative for dizziness, weakness, light-headedness and headaches.  Hematological: Negative for adenopathy. Does not bruise/bleed easily.  Psychiatric/Behavioral: Negative.     Objective:  BP 116/72   Pulse 84   Temp 98.4 F (36.9 C) (Oral)   Resp 16   Ht 5' 6.75" (1.695 m)   Wt 124 lb (56.2 kg)   SpO2 95%   BMI 19.57 kg/m   BP Readings from Last 3 Encounters:  11/13/19 116/72  08/15/19 (!) 110/56  08/14/19 120/60    Wt Readings from Last 3 Encounters:  11/13/19 124 lb (56.2 kg)  08/15/19 123 lb (55.8 kg)  08/14/19 126 lb (57.2 kg)  Physical Exam Vitals reviewed.  Constitutional:      General: She is not in acute distress.    Appearance: Normal appearance. She is not ill-appearing, toxic-appearing or diaphoretic.  HENT:     Nose: Nose normal.     Mouth/Throat:     Mouth: Mucous membranes are moist.  Eyes:     General: No scleral icterus.    Conjunctiva/sclera: Conjunctivae normal.  Cardiovascular:     Rate and Rhythm: Normal rate and regular rhythm.     Heart sounds: Murmur heard.  No gallop.   Pulmonary:     Effort: Pulmonary effort is normal.     Breath sounds: No stridor.  No wheezing, rhonchi or rales.  Abdominal:     General: Abdomen is flat. Bowel sounds are normal. There is no distension.     Palpations: There is no mass.     Tenderness: There is no abdominal tenderness. There is no right CVA tenderness, left CVA tenderness or guarding.  Musculoskeletal:        General: Normal range of motion.     Cervical back: Neck supple.     Right lower leg: No edema.     Left lower leg: No edema.     Left foot: Normal range of motion. Bunion present. No swelling, deformity, foot drop, tenderness, bony tenderness or crepitus.  Lymphadenopathy:     Cervical: No cervical adenopathy.  Skin:    General: Skin is warm and dry.  Neurological:     General: No focal deficit present.     Mental Status: She is alert.  Psychiatric:        Mood and Affect: Mood normal.        Behavior: Behavior normal.     Lab Results  Component Value Date   WBC 4.7 11/13/2019   HGB 12.9 11/13/2019   HCT 38.5 11/13/2019   PLT 157.0 11/13/2019   GLUCOSE 96 11/13/2019   CHOL 155 02/11/2014   TRIG 84.0 02/11/2014   HDL 46.10 02/11/2014   LDLCALC 92 02/11/2014   ALT 18 11/13/2019   AST 21 11/13/2019   NA 140 11/13/2019   K 4.3 11/13/2019   CL 101 11/13/2019   CREATININE 1.39 (H) 11/13/2019   BUN 30 (H) 11/13/2019   CO2 30 11/13/2019   TSH 4.98 (H) 08/14/2019   INR 1.6 (A) 11/07/2019   HGBA1C 5.7 06/12/2019    DG Thoracic Spine W/Swimmers  Result Date: 12/28/2018 CLINICAL DATA:  Mid back pain and lower back pain since a twisting injury 2 months ago. Left leg pain and tingling. EXAM: THORACIC SPINE - 3 VIEWS COMPARISON:  Chest x-ray dated 02/20/2018 and CT scan of the chest dated 07/04/2016 FINDINGS: There is an old healed compression fracture of the superior aspect of T12, unchanged since 05/04/2016. The remainder of the thoracic spine appears normal. IMPRESSION: No acute abnormality of the thoracic spine. Old healed compression fracture of T12, stable. Electronically Signed    By: Lorriane Shire M.D.   On: 12/28/2018 11:14   DG Lumbar Spine Complete  Result Date: 12/28/2018 CLINICAL DATA:  Mid and lower back pain since a twisting injury 2 months ago. Left leg pain and tingling. EXAM: LUMBAR SPINE - COMPLETE 4+ VIEW COMPARISON:  CT scan of the abdomen and pelvis dated 08/29/2014 FINDINGS: There is no evidence of a lumbar spine fracture or bone destruction. Chronic disc space narrowing and disc degeneration at L2-3 and L3-4 and L5-S1. Lateral alignment is normal. Moderate bilateral facet arthritis  at L4-5 and L5-S1. Minimal lumbar scoliosis with convexity to the right centered at L3. Old healed mild compression fracture of T12, stable since 2016. Aortic atherosclerosis. IMPRESSION: 1. No acute abnormality of the lumbar spine. 2. 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 3. Old healed compression fracture of T12. 4.  Aortic Atherosclerosis (ICD10-I70.0). Electronically Signed   By: Lorriane Shire M.D.   On: 12/28/2018 11:16   No results found.   CLINICAL DATA:  Pain.  History of breast carcinoma  EXAM: X-RAY PA CHEST; SUPINE AND UPRIGHT ABDOMEN  COMPARISON:  Chest radiograph April 21, 2017  FINDINGS: PA chest: There is mild scarring in the right lower lobe. There is no edema or consolidation. There is cardiomegaly with pulmonary vascularity normal. Pacemaker leads are attached to the right atrium and right ventricle. There is no evident adenopathy. There is aortic atherosclerosis. There are surgical clips in the right axillary region.  Supine and upright abdomen: There is moderate stool in the colon. There is no bowel dilatation or air-fluid level to suggest bowel obstruction. No free air. There is degenerative change in the lumbar spine.  IMPRESSION: No bowel obstruction or free air.  Mild scarring right lower lobe. No edema or airspace opacity. Stable cardiomegaly. Pacemaker leads attached to right atrium and  right ventricle.  Aortic Atherosclerosis (ICD10-I70.0).  Narrative & Impression  CLINICAL DATA:  Pain and swelling.  EXAM: LEFT FOOT - COMPLETE 3+ VIEW  COMPARISON:  None.  FINDINGS: Frontal, oblique, and lateral views were obtained. Bones are osteoporotic. No fracture or dislocation. Joint spaces appear unremarkable. No erosive change. No radiopaque foreign body or soft tissue air.  IMPRESSION: Bones osteoporotic. No radiopaque foreign body. No fracture or dislocation. No appreciable underlying arthropathic change.     Assessment & Plan:   Lynzy was seen today for flank pain.  Diagnoses and all orders for this visit:  Flu vaccine need -     Flu Vaccine QUAD High Dose(Fluad)  Acute left flank pain- Based on her symptoms, exam, normal lab work, normal plain films I do not think she has an acute abdominal process.  This is likely an exacerbation of IBS.  I offered her reassurance.  If she develops any new, persistent, or worsening symptoms and will consider doing a CT scan of the abdomen. -     DG ABD ACUTE 2+V W 1V CHEST; Future -     CBC with Differential/Platelet; Future -     Lipase; Future -     Urinalysis, Routine w reflex microscopic; Future -     Hepatic function panel; Future -     Cancel: BASIC METABOLIC PANEL WITH GFR; Future -     Basic metabolic panel; Future -     Basic metabolic panel -     Hepatic function panel -     Urinalysis, Routine w reflex microscopic -     Lipase -     CBC with Differential/Platelet  Puncture wound of great toe of left foot, initial encounter- Based on her symptoms, exam, and plain films I do not see any complications related to this. -     DG Foot Complete Left; Future  Irritable bowel syndrome with diarrhea- I recommended that she keep taking Viberzi as directed.   I am having Alyssa [name] maintain her acetaminophen, Viberzi, clonazePAM, levothyroxine, MISC NATURAL PRODUCTS EX, Pain Relieving, bifidobacterium infantis,  Biotin, warfarin, potassium chloride SA, and furosemide.  No orders of the defined types were  placed in this encounter.    Follow-up: Return in about 1 week (around 11/20/2019).  [name], MD

## 2019-11-13 NOTE — Patient Instructions (Signed)
Flank Pain, Adult Flank pain is pain that is located on the side of the body between the upper abdomen and the back. This area is called the flank. The pain may occur over a short period of time (acute), or it may be long-term or recurring (chronic). It may be mild or severe. Flank pain can be caused by many things, including:  Muscle soreness or injury.  Kidney stones or kidney disease.  Stress.  A disease of the spine (vertebral disk disease).  A lung infection (pneumonia).  Fluid around the lungs (pulmonary edema).  A skin rash caused by the chickenpox virus (shingles).  Tumors that affect the back of the abdomen.  Gallbladder disease. Follow these instructions at home:   Drink enough fluid to keep your urine clear or pale yellow.  Rest as told by your health care provider.  Take over-the-counter and prescription medicines only as told by your health care provider.  Keep a journal to track what has caused your flank pain and what has made it feel better.  Keep all follow-up visits as told by your health care provider. This is important. Contact a health care provider if:  Your pain is not controlled with medicine.  You have new symptoms.  Your pain gets worse.  You have a fever.  Your symptoms last longer than 2-3 days.  You have trouble urinating or you are urinating very frequently. Get help right away if:  You have trouble breathing or you are short of breath.  Your abdomen hurts or it is swollen or red.  You have nausea or vomiting.  You feel faint or you pass out.  You have blood in your urine. Summary  Flank pain is pain that is located on the side of the body between the upper abdomen and the back.  The pain may occur over a short period of time (acute), or it may be long-term or recurring (chronic). It may be mild or severe.  Flank pain can be caused by many things.  Contact your health care provider if your symptoms get worse or they last  longer than 2-3 days. This information is not intended to replace advice given to you by your health care provider. Make sure you discuss any questions you have with your health care provider. Document Revised: 01/07/2017 Document Reviewed: 04/09/2016 Elsevier Patient Education  2020 [employer]  

## 2019-11-15 DIAGNOSIS — M5136 Other intervertebral disc degeneration, lumbar region: Secondary | ICD-10-CM | POA: Diagnosis not present

## 2019-11-15 DIAGNOSIS — M62830 Muscle spasm of back: Secondary | ICD-10-CM | POA: Diagnosis not present

## 2019-11-15 DIAGNOSIS — M9903 Segmental and somatic dysfunction of lumbar region: Secondary | ICD-10-CM | POA: Diagnosis not present

## 2019-11-15 DIAGNOSIS — M545 Low back pain, unspecified: Secondary | ICD-10-CM | POA: Diagnosis not present

## 2019-11-19 DIAGNOSIS — M9903 Segmental and somatic dysfunction of lumbar region: Secondary | ICD-10-CM | POA: Diagnosis not present

## 2019-11-19 DIAGNOSIS — M5136 Other intervertebral disc degeneration, lumbar region: Secondary | ICD-10-CM | POA: Diagnosis not present

## 2019-11-19 DIAGNOSIS — M545 Low back pain, unspecified: Secondary | ICD-10-CM | POA: Diagnosis not present

## 2019-11-19 DIAGNOSIS — M62830 Muscle spasm of back: Secondary | ICD-10-CM | POA: Diagnosis not present

## 2019-11-26 ENCOUNTER — Other Ambulatory Visit: Payer: Self-pay | Admitting: Internal Medicine

## 2019-11-26 DIAGNOSIS — E039 Hypothyroidism, unspecified: Secondary | ICD-10-CM

## 2019-12-05 ENCOUNTER — Other Ambulatory Visit: Payer: Self-pay

## 2019-12-05 ENCOUNTER — Ambulatory Visit (INDEPENDENT_AMBULATORY_CARE_PROVIDER_SITE_OTHER): Payer: Medicare Other | Admitting: *Deleted

## 2019-12-05 DIAGNOSIS — I4891 Unspecified atrial fibrillation: Secondary | ICD-10-CM

## 2019-12-05 DIAGNOSIS — Z5181 Encounter for therapeutic drug level monitoring: Secondary | ICD-10-CM | POA: Diagnosis not present

## 2019-12-05 LAB — POCT INR: INR: 2 (ref 2.0–3.0)

## 2019-12-05 NOTE — Patient Instructions (Signed)
Description   Continue taking Warfarin 1 tablet every day except for 1/2 tablet only on Fridays. Recheck in 4 weeks. Call with any new or different medications [phone]

## 2019-12-15 ENCOUNTER — Other Ambulatory Visit: Payer: Self-pay | Admitting: Internal Medicine

## 2019-12-18 DIAGNOSIS — D485 Neoplasm of uncertain behavior of skin: Secondary | ICD-10-CM | POA: Diagnosis not present

## 2019-12-18 DIAGNOSIS — Z85828 Personal history of other malignant neoplasm of skin: Secondary | ICD-10-CM | POA: Diagnosis not present

## 2019-12-18 DIAGNOSIS — C44319 Basal cell carcinoma of skin of other parts of face: Secondary | ICD-10-CM | POA: Diagnosis not present

## 2019-12-18 DIAGNOSIS — L821 Other seborrheic keratosis: Secondary | ICD-10-CM | POA: Diagnosis not present

## 2019-12-18 DIAGNOSIS — L570 Actinic keratosis: Secondary | ICD-10-CM | POA: Diagnosis not present

## 2019-12-20 ENCOUNTER — Ambulatory Visit (INDEPENDENT_AMBULATORY_CARE_PROVIDER_SITE_OTHER): Payer: Medicare Other

## 2019-12-20 DIAGNOSIS — I4891 Unspecified atrial fibrillation: Secondary | ICD-10-CM

## 2019-12-20 LAB — CUP PACEART REMOTE DEVICE CHECK
Battery Impedance: 700 Ohm
Battery Remaining Longevity: 70 mo
Battery Voltage: 2.79 V
Brady Statistic RV Percent Paced: 100 %
Date Time Interrogation Session: 20211111075450
Implantable Lead Implant Date: 20000921
Implantable Lead Implant Date: 20000921
Implantable Lead Location: 753859
Implantable Lead Location: 753860
Implantable Pulse Generator Implant Date: 20170118
Lead Channel Impedance Value: 580 Ohm
Lead Channel Impedance Value: 67 Ohm
Lead Channel Pacing Threshold Amplitude: 1 V
Lead Channel Pacing Threshold Pulse Width: 0.4 ms
Lead Channel Setting Pacing Amplitude: 2.5 V
Lead Channel Setting Pacing Pulse Width: 0.4 ms
Lead Channel Setting Sensing Sensitivity: 2 mV

## 2019-12-24 ENCOUNTER — Ambulatory Visit (INDEPENDENT_AMBULATORY_CARE_PROVIDER_SITE_OTHER): Payer: Medicare Other | Admitting: *Deleted

## 2019-12-24 ENCOUNTER — Telehealth: Payer: Self-pay | Admitting: Pharmacist

## 2019-12-24 ENCOUNTER — Other Ambulatory Visit: Payer: Self-pay

## 2019-12-24 DIAGNOSIS — Z5181 Encounter for therapeutic drug level monitoring: Secondary | ICD-10-CM

## 2019-12-24 DIAGNOSIS — I4891 Unspecified atrial fibrillation: Secondary | ICD-10-CM | POA: Diagnosis not present

## 2019-12-24 LAB — POCT INR: INR: 1.7 — AB (ref 2.0–3.0)

## 2019-12-24 NOTE — Progress Notes (Signed)
Remote pacemaker transmission.   

## 2019-12-24 NOTE — Patient Instructions (Signed)
Description   Take 1.5 tablet today and then continue taking Warfarin 1 tablet every day except for 1/2 tablet only on Fridays. Recheck in 2 weeks. Call with any new or different medications [phone]

## 2019-12-24 NOTE — Telephone Encounter (Signed)
Pt called clinic and reports experiencing a bad nose bleed on Saturday night after she picked a spot. She states it bled for an hour and she called the paramedics. They evaluated her but did not think she needed to go to the ER. They did not obtain an INR. She has not had any other bleeding in her nose yesterday or today. She prefers to have INR checked sooner - added pt on in available slot today.

## 2019-12-29 ENCOUNTER — Encounter (HOSPITAL_COMMUNITY): Payer: Self-pay | Admitting: Emergency Medicine

## 2019-12-29 ENCOUNTER — Other Ambulatory Visit: Payer: Self-pay

## 2019-12-29 ENCOUNTER — Emergency Department (HOSPITAL_COMMUNITY)
Admission: EM | Admit: 2019-12-29 | Discharge: 2019-12-29 | Disposition: A | Discharge status: Home / Self Care | Payer: Medicare Other | Attending: Emergency Medicine | Admitting: Emergency Medicine

## 2019-12-29 DIAGNOSIS — Z853 Personal history of malignant neoplasm of breast: Secondary | ICD-10-CM | POA: Insufficient documentation

## 2019-12-29 DIAGNOSIS — Z85850 Personal history of malignant neoplasm of thyroid: Secondary | ICD-10-CM | POA: Diagnosis not present

## 2019-12-29 DIAGNOSIS — I130 Hypertensive heart and chronic kidney disease with heart failure and stage 1 through stage 4 chronic kidney disease, or unspecified chronic kidney disease: Secondary | ICD-10-CM | POA: Diagnosis not present

## 2019-12-29 DIAGNOSIS — E039 Hypothyroidism, unspecified: Secondary | ICD-10-CM | POA: Insufficient documentation

## 2019-12-29 DIAGNOSIS — I509 Heart failure, unspecified: Secondary | ICD-10-CM | POA: Diagnosis not present

## 2019-12-29 DIAGNOSIS — Z87891 Personal history of nicotine dependence: Secondary | ICD-10-CM | POA: Diagnosis not present

## 2019-12-29 DIAGNOSIS — N183 Chronic kidney disease, stage 3 unspecified: Secondary | ICD-10-CM | POA: Insufficient documentation

## 2019-12-29 DIAGNOSIS — Z950 Presence of cardiac pacemaker: Secondary | ICD-10-CM | POA: Insufficient documentation

## 2019-12-29 DIAGNOSIS — Z7901 Long term (current) use of anticoagulants: Secondary | ICD-10-CM | POA: Diagnosis not present

## 2019-12-29 DIAGNOSIS — R040 Epistaxis: Secondary | ICD-10-CM

## 2019-12-29 LAB — CBC
HCT: 41.7 % (ref 36.0–46.0)
Hemoglobin: 13.1 g/dL (ref 12.0–15.0)
MCH: 31 pg (ref 26.0–34.0)
MCHC: 31.4 g/dL (ref 30.0–36.0)
MCV: 98.6 fL (ref 80.0–100.0)
Platelets: 176 10*3/uL (ref 150–400)
RBC: 4.23 MIL/uL (ref 3.87–5.11)
RDW: 12.2 % (ref 11.5–15.5)
WBC: 3.6 10*3/uL — ABNORMAL LOW (ref 4.0–10.5)
nRBC: 0 % (ref 0.0–0.2)

## 2019-12-29 LAB — PROTIME-INR
INR: 1.8 — ABNORMAL HIGH (ref 0.8–1.2)
Prothrombin Time: 20.4 seconds — ABNORMAL HIGH (ref 11.4–15.2)

## 2019-12-29 MED ORDER — OXYMETAZOLINE HCL 0.05 % NA SOLN
1.0000 | Freq: Once | NASAL | Status: AC
  Administered 2019-12-29: 1 via NASAL
  Filled 2019-12-29: qty 30

## 2019-12-29 MED ORDER — OXYMETAZOLINE HCL 0.05 % NA SOLN: 1.0000 | Freq: Once | NASAL | Status: DC

## 2019-12-29 NOTE — ED Notes (Signed)
Placed Afrin soaked guaze in right nare at this time.

## 2019-12-29 NOTE — ED Provider Notes (Signed)
[name]   CSN: [id number] Arrival date & time: 12/29/19  1043     History Chief Complaint  Patient presents with  . Epistaxis    Alyssa [name] is a 86 y.o. female.  With a past medical history of A. fib on chronic anticoagulation with Coumadin who presents for evaluation of epistaxis.  Patient states that she was manipulating her nose 2 days ago when she began having bleeding out of the right nare.  She had excessive bleeding.  She tried to lie back and put an ice pack on her nose without relief of her symptoms.  Eventually she called 911 and was evaluated by EMS.  The were able to get the bleeding to stop.  Patient had recurrence of the bleeding today, went to urgent care and then was told to come here because she is on Coumadin.  She said no active bleeding since she has been in the emergency department.  She denies weakness, shortness of breath or feelings of presyncope  HPI     Past Medical History:  Diagnosis Date  . Atrial fibrillation ([place])    pacemaker, chronic anticoag  . Breast cancer ([place])   . Diverticulosis   . Fibroma    inner lips/mouth  . GERD (gastroesophageal reflux disease)   . Hx of breast cancer   . Hyperkalemia   . Hypertension   . Hypothyroidism   . IBS (irritable bowel syndrome)   . Internal hemorrhoids   . Left inguinal hernia    direct  . MVP (mitral valve prolapse)   . Osteoporosis   . Renal insufficiency   . Thyroid cancer [facility])     Patient Active Problem List   Diagnosis Date Noted  . Acute left flank pain 11/13/2019  . Puncture wound of great toe of left foot 11/13/2019  . Chronic right-sided low back pain without sciatica 05/09/2019  . Fracture of vertebra due to osteoporosis ([place]) 12/28/2018  . Hyperglycemia 02/20/2018  . IBS (irritable bowel syndrome) 02/20/2018  . Seasonal allergic rhinitis due to pollen 11/25/2016  . (HFpEF) heart failure with preserved ejection fraction  ([place]) 08/13/2016  . GAD (generalized anxiety disorder) 02/18/2016  . Essential hypertension, benign 09/04/2015  . Severe tricuspid regurgitation 04/22/2015  . Complete heart block ([place]) 02/26/2015  . Routine general medical examination at a health care facility 02/11/2014  . Encounter for therapeutic drug monitoring 07/23/2013  .  atrial fibrillation 10/11/2012  . Chronic renal insufficiency, stage III (moderate) ([place]) 06/12/2012  . Pacemaker-dual chamber  06/23/2010  . Irritable bowel syndrome 12/01/2009  . GERD 06/10/2009  . Hypothyroidism 02/26/2008  . OSTEOPOROSIS 02/26/2008    Past Surgical History:  Procedure Laterality Date  . COLONOSCOPY  10/28/2005   internal hemorrhoids, diverticulosis (same as in 2002 and random bxs negative then)  . EP IMPLANTABLE DEVICE N/A 02/26/2015   Procedure: PPM Generator Changeout;  Surgeon: [name], MD;  Location: [facility];  Service: Cardiovascular;  Laterality: N/A;  . MASTECTOMY  1982   Bilateral  . PACEMAKER INSERTION    . THYROIDECTOMY    . TONSILLECTOMY    . UPPER GASTROINTESTINAL ENDOSCOPY  09/06/2000   normal     OB History   No obstetric history on file.     Family History  Problem Relation Age of Onset  . Colon cancer Father 11  . Heart disease Father   . Stroke Mother   . Arthritis Other   .  Hypertension Other   . Esophageal cancer Neg Hx   . Pancreatic cancer Neg Hx   . Kidney disease Neg Hx   . Liver disease Neg Hx   . Diabetes Neg Hx   . Rectal cancer Neg Hx   . Stomach cancer Neg Hx     Social History   Tobacco Use  . Smoking status: Former Smoker    Quit date: 02/08/1954    Years since quitting: 65.9  . Smokeless tobacco: Never Used  Vaping Use  . Vaping Use: Never used  Substance Use Topics  . Alcohol use: No    Alcohol/week: 0.0 standard drinks  . Drug use: No    Home Medications Prior to Admission medications   Medication Sig Start Date End Date Taking? Authorizing Provider    acetaminophen (TYLENOL ARTHRITIS PAIN) 650 MG CR tablet Take 1,300 mg by mouth 2 (two) times daily.     Yes [provider]  bifidobacterium infantis (ALIGN) capsule Take 2 capsules by mouth daily.   Yes [provider]  clonazePAM (KLONOPIN) 0.5 MG tablet Take 1 tablet by mouth twice daily as needed for anxiety 02/07/19  Yes [name], MD  Eluxadoline (VIBERZI) 75 MG TABS Take 1 tablet by mouth daily. Patient taking differently: Take 1 tablet by mouth daily as needed (ibs).  09/25/18  Yes [name], MD  EUTHYROX 50 MCG tablet TAKE 1 TABLET BY MOUTH ONCE DAILY BEFORE BREAKFAST Patient taking differently: Take 50 mcg by mouth daily before breakfast.  11/26/19  Yes [name], MD  furosemide (LASIX) 40 MG tablet Take 1 tablet by mouth once daily 10/29/19  Yes [name], MD  MISC NATURAL PRODUCTS EX Apply 1 application topically in the morning and at bedtime. CBD cream for arthritis pain    Yes [provider]  potassium chloride SA (KLOR-CON) 20 MEQ tablet Take 1 tablet by mouth once daily Patient taking differently: Take 10 mEq by mouth daily.  09/21/19  Yes [name], MD  warfarin (COUMADIN) 3 MG tablet TAKE 1 TABLET BY MOUTH ONCE DAILY OR AS DIRECTED BY ANTICOAGULATION CLINIC Patient taking differently: Take 1.5-3 mg by mouth as directed. Take 1 tablet (3 mg) everyday except on Friday Take 1/2 tablet (1.5 mg) AS DIRECTED BY ANTICOAGULATION CLINIC 12/17/19  Yes [name], MD    Allergies    Patient has no known allergies.  Review of Systems   Review of Systems Ten systems reviewed and are negative for acute change, except as noted in the HPI. \ Physical Exam Updated Vital Signs BP (!) 156/94 (BP Location: Right Arm)   Pulse 64   Temp 97.7 F (36.5 C) (Oral)   Resp 20   Ht 5\' 6"  (1.676 m)   Wt 55.3 kg   SpO2 99%   BMI 19.69 kg/m   Physical Exam Vitals and nursing note reviewed.  Constitutional:      General: She is not in  acute distress.    Appearance: She is well-developed. She is not diaphoretic.  HENT:     Head: Normocephalic and atraumatic.     Nose:     Comments: Right nare with erythema and irritation of the right septal wall.  No obvious source of bleeding.  The left nare is otherwise normal No blood on the posterior oropharynx Eyes:     General: No scleral icterus.    Conjunctiva/sclera: Conjunctivae normal.  Cardiovascular:     Rate and Rhythm: Normal rate  and regular rhythm.     Heart sounds: Normal heart sounds. No murmur heard.  No friction rub. No gallop.   Pulmonary:     Effort: Pulmonary effort is normal. No respiratory distress.     Breath sounds: Normal breath sounds.  Abdominal:     General: Bowel sounds are normal. There is no distension.     Palpations: Abdomen is soft. There is no mass.     Tenderness: There is no abdominal tenderness. There is no guarding.  Musculoskeletal:     Cervical back: Normal range of motion.  Skin:    General: Skin is warm and dry.  Neurological:     Mental Status: She is alert and oriented to person, place, and time.  Psychiatric:        Behavior: Behavior normal.     ED Results / Procedures / Treatments   Labs (all labs ordered are listed, but only abnormal results are displayed) Labs Reviewed  CBC - Abnormal; Notable for the following components:      Result Value   WBC 3.6 (*)    All other components within normal limits  PROTIME-INR - Abnormal; Notable for the following components:   Prothrombin Time 20.4 (*)    INR 1.8 (*)    All other components within normal limits    EKG None  Radiology No results found.  Procedures Procedures (including critical care time)  Medications Ordered in ED Medications  oxymetazoline (AFRIN) 0.05 % nasal spray 1 spray (has no administration in time range)  oxymetazoline (AFRIN) 0.05 % nasal spray 1 spray (1 spray Each Nare Given 12/29/19 1353)    ED Course  I have reviewed the triage vital  signs and the nursing notes.  Pertinent labs & imaging results that were available during my care of the patient were reviewed by me and considered in my medical decision making (see chart for details).    MDM Rules/Calculators/A&P                          86 year old female here with epistaxis.  This appears to be anterior however she has no active bleeding at this time.  Patient does have slightly elevated blood pressure which she will follow up with her primary care about.  I reviewed and interpreted the patient's labs which shows no significant abnormalities, no drop in hemoglobin.  She is hemodynamically stable here in the emergency department.  She is mildly subtherapeutic on her INR and is to follow closely with her PCP for further management.  Patient given Afrin nasal spray here in the emergency department.  Discussed management of epistaxis at home and given a nasal hygiene handout.  She appears otherwise appropriate for discharge at this time Final Clinical Impression(s) / ED Diagnoses Final diagnoses:  Epistaxis    Rx / DC Orders ED Discharge Orders    None       Margarita Mail, PA-C 12/29/19 1419    Breck Coons, MD 12/30/19 [phone]

## 2019-12-29 NOTE — Discharge Instructions (Signed)
If your nose begins to bleed again.  Apply Afrin to cause and pack the nasal cavity.  Apply direct pressure to the soft part of the nose for 10 to 20 minutes without stopping.  If your bleeding continues come to the emergency department. Nasal Hygiene The nose has many positive effects on the air you breathe in that you may not be aware of. - Temperature regulation - Filtration and removal of particulate matter - Humidification - Defense against infections There are several things you can do to help keep your nose healthy. Foremost is nasal hygiene. This will help with your nose's natural function and keep it moist and healthy. Techniques to accomplish this are outlined below. These will help with nasal dryness, nasal crusting, and nose bleeds. They also assist with clearing thick mucus that cause you to blow your nose frequently and may be associated with thick postnasal drip.  1. Use nasal saline daily. You can buy small bottles of this over-the-counter at the drug store or grocery store. Some brand names are: [place], [place], [place]. Apply 2-3 sprays each nostril several times a day. If your nose feels dry or have had recent nasal surgery, try to use it every couple of hours. There is no medicine in it so it can be used as often as you like. Do NOT use the sprays containing decongestants. The appropriate way to apply nasal sprays: Place the nozzle just inside your nostril and point it towards the corner of your eye. Often, it is helpful to use the right-hand to spray into the left nasal cavity and use the left-hand to spray into the right side.  2. Use nasal saline irrigations and flushing.SinuCleanse, Simply Saline, [place]. Use this 1-2 times a day. We can also provide you with a recipe to use at home. Just ask [place]. To prevent reintroducing bacteria back into your nose, please keep your irrigation equipment clean and dry between uses. Throw away and replace reusable irrigation equipment  every 3 weeks.   3. Use Vaseline petroleum jelly or Aquaphor. You can apply this gently to each nostril 2-3 times a day to promote moisturization for your nose. You may also use triple antibiotic ointment such as Neosporin or Bacitracin. These can all be bought over-the-counter.  4. Consider other nasal emollients. A few preparations are available over-thecounter. Ponaris, Nose Better, Pretz. Ask your pharmacist what is available. Also, some nasal saline sprays have additives such as aloe and these are helpful.  5. Consider using a humidifier at home. If your nose feels dry and/or you have frequent nose bleeds, you can buy a humidifier for your home. Be cautious in using these if you have mold allergies.  6. Avoid excessive manual manipulation of your nose and nostrils. Frequent rubbing of your nostrils and the passing of tissues or fingers in your nostrils may aggravate nasal irritation from dryness and nose bleeds.

## 2019-12-29 NOTE — ED Triage Notes (Signed)
Pt arrives to ED with complaints of epistaxis x2 days. Pt states that her nose has been bleeding on and off for the last two days. Pt states it is taking about 71min to 1hr for her bleeds to stop. Pt is on coumadin. No nose bleed currently.

## 2019-12-31 ENCOUNTER — Telehealth: Payer: Self-pay | Admitting: Internal Medicine

## 2019-12-31 NOTE — Telephone Encounter (Signed)
Caller states she had a severe nose bleed 1 hour ago. It has quit now since pinching and holding ice on it. She also had a nose bleed 2 days ago and it bled for an hour or so. They had the paramedics come by and it stopped 10 minutes later. The bleeding was not profuse this am. She is on coumadin. They are wanting an appt for Monday. The urgent care cannot see her and wanted to send her to the [facility]. She does not want to do that. The patient went to the ED. She said they referred her to an ENT.

## 2020-01-09 DIAGNOSIS — R040 Epistaxis: Secondary | ICD-10-CM | POA: Diagnosis not present

## 2020-01-10 DIAGNOSIS — R040 Epistaxis: Secondary | ICD-10-CM | POA: Diagnosis not present

## 2020-01-11 ENCOUNTER — Ambulatory Visit (INDEPENDENT_AMBULATORY_CARE_PROVIDER_SITE_OTHER): Payer: Medicare Other | Admitting: *Deleted

## 2020-01-11 ENCOUNTER — Other Ambulatory Visit: Payer: Self-pay

## 2020-01-11 DIAGNOSIS — Z5181 Encounter for therapeutic drug level monitoring: Secondary | ICD-10-CM

## 2020-01-11 DIAGNOSIS — I4891 Unspecified atrial fibrillation: Secondary | ICD-10-CM

## 2020-01-11 LAB — POCT INR: INR: 1.9 — AB (ref 2.0–3.0)

## 2020-01-11 NOTE — Patient Instructions (Signed)
Description   Take 1 tablet today and then continue taking Warfarin 1 tablet every day except for 1/2 tablet only on Fridays. Recheck in 1 week. Call with any new or different medications [phone]

## 2020-01-13 ENCOUNTER — Other Ambulatory Visit: Payer: Self-pay | Admitting: Internal Medicine

## 2020-01-13 DIAGNOSIS — F411 Generalized anxiety disorder: Secondary | ICD-10-CM

## 2020-01-14 ENCOUNTER — Ambulatory Visit (INDEPENDENT_AMBULATORY_CARE_PROVIDER_SITE_OTHER): Payer: Medicare Other

## 2020-01-14 ENCOUNTER — Other Ambulatory Visit: Payer: Self-pay

## 2020-01-14 ENCOUNTER — Encounter: Payer: Self-pay | Admitting: Internal Medicine

## 2020-01-14 ENCOUNTER — Telehealth: Payer: Self-pay | Admitting: Internal Medicine

## 2020-01-14 ENCOUNTER — Ambulatory Visit (INDEPENDENT_AMBULATORY_CARE_PROVIDER_SITE_OTHER): Payer: Medicare Other | Admitting: Internal Medicine

## 2020-01-14 VITALS — BP 146/90 | HR 71 | Temp 97.6°F | Resp 16 | Ht 66.0 in | Wt 124.0 lb

## 2020-01-14 DIAGNOSIS — R042 Hemoptysis: Secondary | ICD-10-CM | POA: Diagnosis not present

## 2020-01-14 DIAGNOSIS — E039 Hypothyroidism, unspecified: Secondary | ICD-10-CM

## 2020-01-14 DIAGNOSIS — Z5181 Encounter for therapeutic drug level monitoring: Secondary | ICD-10-CM | POA: Diagnosis not present

## 2020-01-14 DIAGNOSIS — R040 Epistaxis: Secondary | ICD-10-CM | POA: Diagnosis not present

## 2020-01-14 LAB — PROTIME-INR
INR: 1.7 ratio — ABNORMAL HIGH (ref 0.8–1.0)
Prothrombin Time: 18.5 s — ABNORMAL HIGH (ref 9.6–13.1)

## 2020-01-14 LAB — CBC WITH DIFFERENTIAL/PLATELET
Basophils Absolute: 0 10*3/uL (ref 0.0–0.1)
Basophils Relative: 0.9 % (ref 0.0–3.0)
Eosinophils Absolute: 0.1 10*3/uL (ref 0.0–0.7)
Eosinophils Relative: 2.3 % (ref 0.0–5.0)
HCT: 39.4 % (ref 36.0–46.0)
Hemoglobin: 12.9 g/dL (ref 12.0–15.0)
Lymphocytes Relative: 37.1 % (ref 12.0–46.0)
Lymphs Abs: 1.2 10*3/uL (ref 0.7–4.0)
MCHC: 32.8 g/dL (ref 30.0–36.0)
MCV: 94 fl (ref 78.0–100.0)
Monocytes Absolute: 0.3 10*3/uL (ref 0.1–1.0)
Monocytes Relative: 8.5 % (ref 3.0–12.0)
Neutro Abs: 1.6 10*3/uL (ref 1.4–7.7)
Neutrophils Relative %: 51.2 % (ref 43.0–77.0)
Platelets: 172 10*3/uL (ref 150.0–400.0)
RBC: 4.19 Mil/uL (ref 3.87–5.11)
RDW: 12.8 % (ref 11.5–15.5)
WBC: 3.2 10*3/uL — ABNORMAL LOW (ref 4.0–10.5)

## 2020-01-14 LAB — APTT: aPTT: 45.4 s — ABNORMAL HIGH (ref 23.4–32.7)

## 2020-01-14 LAB — TSH: TSH: 8.08 u[IU]/mL — ABNORMAL HIGH (ref 0.35–4.50)

## 2020-01-14 NOTE — Telephone Encounter (Signed)
Patient's daughter, Maudie Mercury, would like to make Dr. Renold Genta aware that Dr. Deeann Saint office is in process of faxing a clearance request to our office. She requested a call back with updates when we hear from their office. Please contact Kim at [phone].

## 2020-01-14 NOTE — Progress Notes (Signed)
Subjective:  Patient ID: Alyssa [name], female    DOB: 18-Jul-1924  Age: 86 y.o. MRN: [id number]  CC: Epistaxis  This visit occurred during the SARS-CoV-2 public health emergency.  Safety protocols were in place, including screening questions prior to the visit, additional usage of staff PPE, and extensive cleaning of exam room while observing appropriate contact time as indicated for disinfecting solutions.    HPI Alyssa [name] presents for f/up - She complains of a several week history of epistaxis from her right nares.  She has called 911, she has been to the ED and has had it packed, she has seen ENT a couple of times and has undergone a superficial cautery.  She recently had her INR checked and it was in the therapeutic range of 1.9.  Her cardiologist told her to continue taking Coumadin.  Her ENT doctor has told her that if the bleeding does not stop she will need to have minor surgery.  The epistaxis has continued over the last few days.  Outpatient Medications Prior to Visit  Medication Sig Dispense Refill  . acetaminophen (TYLENOL ARTHRITIS PAIN) 650 MG CR tablet Take 1,300 mg by mouth 2 (two) times daily.      . bifidobacterium infantis (ALIGN) capsule Take 2 capsules by mouth daily.    . clonazePAM (KLONOPIN) 0.5 MG tablet Take 1 tablet by mouth twice daily as needed for anxiety 180 tablet 1  . Eluxadoline (VIBERZI) 75 MG TABS Take 1 tablet by mouth daily. (Patient taking differently: Take 1 tablet by mouth daily as needed (ibs). ) 56 tablet 0  . EUTHYROX 50 MCG tablet TAKE 1 TABLET BY MOUTH ONCE DAILY BEFORE BREAKFAST (Patient taking differently: Take 50 mcg by mouth daily before breakfast. ) 90 tablet 1  . furosemide (LASIX) 40 MG tablet Take 1 tablet by mouth once daily 90 tablet 0  . MISC NATURAL PRODUCTS EX Apply 1 application topically in the morning and at bedtime. CBD cream for arthritis pain     . potassium chloride SA (KLOR-CON) 20 MEQ tablet Take 1 tablet by mouth once daily  (Patient taking differently: Take 10 mEq by mouth daily. ) 90 tablet 0  . warfarin (COUMADIN) 3 MG tablet TAKE 1 TABLET BY MOUTH ONCE DAILY OR AS DIRECTED BY ANTICOAGULATION CLINIC (Patient taking differently: Take 1.5-3 mg by mouth as directed. Take 1 tablet (3 mg) everyday except on Friday Take 1/2 tablet (1.5 mg) AS DIRECTED BY ANTICOAGULATION CLINIC) 90 tablet 1   No facility-administered medications prior to visit.    ROS Review of Systems  Constitutional: Negative for chills, diaphoresis, fatigue and fever.  HENT: Positive for nosebleeds. Negative for facial swelling, rhinorrhea and sinus pressure.   Eyes: Negative.   Respiratory: Negative for cough, chest tightness, shortness of breath and wheezing.   Cardiovascular: Negative for chest pain, palpitations and leg swelling.  Gastrointestinal: Negative for abdominal pain, constipation, diarrhea, nausea and vomiting.  Endocrine: Negative.   Genitourinary: Negative.  Negative for difficulty urinating.  Musculoskeletal: Negative for arthralgias.  Skin: Negative.   Neurological: Negative.  Negative for dizziness and weakness.  Hematological: Negative for adenopathy. Bruises/bleeds easily.  Psychiatric/Behavioral: Negative.     Objective:  BP (!) 146/90   Pulse 71   Temp 97.6 F (36.4 C) (Oral)   Resp 16   Ht 5\' 6"  (1.676 m)   Wt 124 lb (56.2 kg)   SpO2 97%   BMI 20.01 kg/m   BP Readings from Last 3 Encounters:  01/14/20 (!) 146/90  12/29/19 (!) 164/86  11/13/19 116/72    Wt Readings from Last 3 Encounters:  01/14/20 124 lb (56.2 kg)  12/29/19 122 lb (55.3 kg)  11/13/19 124 lb (56.2 kg)    Physical Exam Vitals reviewed.  HENT:     Nose:     Comments: Right nares is packed.  There is a scant amount of dried blood on the packing.  There is no active bleeding.  Left nares is negative for bleeding.    Mouth/Throat:     Mouth: Mucous membranes are moist.  Eyes:     General: No scleral icterus.    Conjunctiva/sclera:  Conjunctivae normal.  Cardiovascular:     Rate and Rhythm: Normal rate and regular rhythm.     Heart sounds: Murmur heard.   Pulmonary:     Breath sounds: No stridor. No wheezing, rhonchi or rales.  Abdominal:     General: Abdomen is flat.     Palpations: There is no mass.     Tenderness: There is no abdominal tenderness. There is no guarding.  Musculoskeletal:        General: Normal range of motion.     Cervical back: Neck supple.     Right lower leg: No edema.     Left lower leg: No edema.  Lymphadenopathy:     Cervical: No cervical adenopathy.  Skin:    General: Skin is warm and dry.     Coloration: Skin is not pale.  Neurological:     General: No focal deficit present.     Mental Status: She is alert.  Psychiatric:        Mood and Affect: Mood normal.        Behavior: Behavior normal.     Lab Results  Component Value Date   WBC 3.2 (L) 01/14/2020   HGB 12.9 01/14/2020   HCT 39.4 01/14/2020   PLT 172.0 01/14/2020   GLUCOSE 96 11/13/2019   CHOL 155 02/11/2014   TRIG 84.0 02/11/2014   HDL 46.10 02/11/2014   LDLCALC 92 02/11/2014   ALT 18 11/13/2019   AST 21 11/13/2019   NA 140 11/13/2019   K 4.3 11/13/2019   CL 101 11/13/2019   CREATININE 1.39 (H) 11/13/2019   BUN 30 (H) 11/13/2019   CO2 30 11/13/2019   TSH 8.08 (H) 01/14/2020   INR 1.7 (H) 01/14/2020   HGBA1C 5.7 06/12/2019    No results found.  Assessment & Plan:   Alyssa [name] was seen today for epistaxis.  Diagnoses and all orders for this visit:  Right-sided epistaxis- Since she has recurrent epistaxis I recommended that she hold Coumadin for now.  I have also recommended that she notify her ENT doctor that it may be time to have the minor surgery. -     CBC with Differential/Platelet; Future -     Protime-INR; Future -     APTT; Future -     APTT -     Protime-INR -     CBC with Differential/Platelet  Hemoptysis- Her chest x-ray is negative. -     CBC with Differential/Platelet; Future -      Protime-INR; Future -     APTT; Future -     DG Chest 2 View; Future -     APTT -     Protime-INR -     CBC with Differential/Platelet  Acquired hypothyroidism- Her TSH is in the acceptable range.  She will remain on the current dose  of levothyroxine. -     TSH; Future -     TSH  Encounter for therapeutic drug monitoring- Her PT and PTT are mildly elevated.  Since she is experiencing a bleeding complication I recommended that she hold Coumadin for now. -     CBC with Differential/Platelet; Future -     Protime-INR; Future -     APTT; Future -     APTT -     Protime-INR -     CBC with Differential/Platelet   I am having Alyssa [name] maintain her acetaminophen, Viberzi, MISC NATURAL PRODUCTS EX, bifidobacterium infantis, potassium chloride SA, furosemide, Euthyrox, warfarin, and clonazePAM.  No orders of the defined types were placed in this encounter.    Follow-up: No follow-ups on file.  [name], MD

## 2020-01-15 ENCOUNTER — Encounter: Payer: Self-pay | Admitting: Internal Medicine

## 2020-01-15 NOTE — Telephone Encounter (Signed)
FYI

## 2020-01-15 NOTE — Patient Instructions (Signed)
Nosebleed  A nosebleed is when blood comes out of the nose. Nosebleeds are common. They are usually not a sign of a serious medical problem. Follow these instructions at home: When you have a nosebleed:  Sit down.  Tilt your head a little forward.  Follow these steps: 1. Pinch your nose with a clean towel or tissue. 2. Keep pinching your nose for 10 minutes. Do not let go. 3. After 10 minutes, let go of your nose. 4. If there is still bleeding, do these steps again. Keep doing these steps until the bleeding stops.  Do not put things in your nose to stop the bleeding.  Try not to lie down or put your head back.  Use a nose spray decongestant as told by your doctor.  Do not use petroleum jelly or mineral oil in your nose. These things can get into your lungs. After a nosebleed:  Try not to blow your nose or sniffle for several hours.  Try not to strain, lift, or bend at the waist for several days.  Use saline spray or a humidifier as told by your doctor.  Aspirin and blood-thinning medicines make bleeding more likely. If you take these medicines, ask your doctor if you should stop taking them, or if you should change how much you take. Do not stop taking the medicine unless your doctor tells you to. Contact a doctor if:  You have a fever.  You get nosebleeds often.  You are getting nosebleeds more often than usual.  You bruise very easily.  You have something stuck in your nose.  You have bleeding in your mouth.  You throw up (vomit) or cough up brown material.  You get a nosebleed after you start a new medicine. Get help right away if:  You have a nosebleed after you fall or hurt your head.  Your nosebleed does not go away after 20 minutes.  You feel dizzy or weak.  You have unusual bleeding from other parts of your body.  You have unusual bruising on other parts of your body.  You get sweaty.  You throw up blood. Summary  Nosebleeds are common. They  are usually not a sign of a serious medical problem.  When you have a nosebleed, sit down and tilt your head a little forward. Pinch your nose with a clean tissue.  After the bleeding stops, try not to blow your nose or sniffle for several hours. This information is not intended to replace advice given to you by your health care provider. Make sure you discuss any questions you have with your health care provider. Document Revised: 01/07/2017 Document Reviewed: 05/07/2016 Elsevier Patient Education  [place].

## 2020-01-16 ENCOUNTER — Telehealth: Payer: Self-pay | Admitting: Internal Medicine

## 2020-01-16 NOTE — Telephone Encounter (Signed)
Patient called to ask about if she should start  to take her coumadin again since her nose bleeds a few days ago. Please call to confirm

## 2020-01-16 NOTE — Telephone Encounter (Signed)
Pharmacy, patient is on Coumadin (although clearance form does not mention needing to hold this). Can you please comment on how long this can be hold for upcoming procedure just to cover all our basis?  Thank you!

## 2020-01-16 NOTE — Telephone Encounter (Signed)
Patient does not need to hold warfarin for this procedure.

## 2020-01-16 NOTE — Telephone Encounter (Signed)
   [facility] Pre-operative Risk Assessment    HEARTCARE STAFF: - Please ensure there is not already an duplicate clearance open for this procedure. - Under Visit Info/Reason for Call, type in Other and utilize the format Clearance MM/DD/YY or Clearance TBD. Do not use dashes or single digits. - If request is for dental extraction, please clarify the # of teeth to be extracted.  Request for surgical clearance:  1. What type of surgery is being performed? ELECTRO NASAL CAUTREY   2. When is this surgery scheduled? TBD   3. What type of clearance is required (medical clearance vs. Pharmacy clearance to hold med vs. Both)? MEDICAL  4. Are there any medications that need to be held prior to surgery and how long? PER REQUEST NO MEDICATIONS NEED TO BE HELD   5. Practice name and name of physician performing surgery? DR. Benjamine Mola   6. What is the office phone number? [phone]   7.   What is the office fax number? [phone]  8.   Anesthesia type (None, local, MAC, general) ? GENERAL   Julaine Hua 01/16/2020, 2:03 PM  _________________________________________________________________   (provider comments below)

## 2020-01-16 NOTE — Telephone Encounter (Signed)
Returned call to the patient but there was no answer so left a message and  will await a call back from the patient. Per the chart the pt will be having electro nasal cautery and no date at this time, need to see if she has a date and what was recommended as we did not know she has stopped her warfarin. On 01/14/2020 per OV note from Dr. [name] he recommended her holding her Warfarin for now and notify her ENT physician.

## 2020-01-16 NOTE — Telephone Encounter (Signed)
Pt called back in returning call to [facility] number [phone]

## 2020-01-16 NOTE — Telephone Encounter (Signed)
    Patient wants to know when she should resume Coumadin. Patient has also reached out to Cardiology for advice She states she has not had any nose bleeding in the past several days

## 2020-01-16 NOTE — Telephone Encounter (Signed)
Returned a call to the patient and she states she been off her Warfarin for 2 days and needs to know how long Dr. [name] wants her to hold her Warfarin. I clarified that I was calling from the Cardiologist office and she stated she needed to call Dr. [name]. Before ending call asked her about her bleeding. She stated let me get my calendar and stated the bleeding stopped on 01/10/2020 and she saw Anticoagulation Clinic on 01/11/20 and INR was 1.9 and she removed the nasal packing that night and did not have any nasal bleeding, then she saw Dr. [name], her PCP on 01/14/2020 and she states he told her to hold Warfarin and she was not having any bleeding but did not tell her when to resume so she needed to know what he wanted her to do. Advised since she has not been bleeding and Dr. [name] wanted her to hold her Warfarin then he needs to clarify how long and when to resume. Dr. Caryl Comes is not in the office today and speaking with Dr. [name] is important since he is the Physician that told her to stop it. She is aware of risks (stroke and clot) of holding Warfarin as she has been told this in the past.  Asked pt when she will have her nasal procedure by ENT and she stated that they are not planning to do that if she is not bleeding and advised to let us know if she does start bleeding again and if she does have to have the nasal procedure done.   Will follow up with pt so we can know when she has resumed so INR can be rechecked.

## 2020-01-17 NOTE — Telephone Encounter (Signed)
Pt has been informed and expressed understanding.  

## 2020-01-17 NOTE — Telephone Encounter (Signed)
Left message to call back and ask to speak with pre-op team.  Darreld Mclean, PA-C 01/17/2020 10:16 AM

## 2020-01-17 NOTE — Telephone Encounter (Signed)
If the nosebleeds have resolved then she should restart coumadin

## 2020-01-18 NOTE — Telephone Encounter (Signed)
Patient was wanting a call back she can be reached at [phone]. She has questions about her coumadin.

## 2020-01-18 NOTE — Telephone Encounter (Signed)
Pt informed to hold Coumadin until nosebleeds have resolved.  Pt states she has not taken Coumadin since yesterday; is advised to continue to hold through the w/e.    Pt states she has held the coumadin before & restarted & the nosebleeds returned.  She is concerned that it will happen again.  Pt states this second time the nosebleeds were not as heavy & she was bleeding from both nares.   She will keep Dr [name] updated.

## 2020-01-18 NOTE — Telephone Encounter (Signed)
Patient called and was wondering if she could get a call back. She has questions about her coumadin. She said that her nose has started to bleed again. She can be reached at [phone].

## 2020-01-18 NOTE — Telephone Encounter (Signed)
Can resume coumadin once nosebleeds resolve as before

## 2020-01-18 NOTE — Telephone Encounter (Signed)
Pt called Coumadin clinic. States her nose bleeding started up again and she's wanting to know if she should stop her Coumadin again. Previously had 2 places cauterized on 12/1 and 12/2 which had stopped the bleeding for a few days, but now it has returned.   Advised pt that her PCP Dr [name] had been advising on stopping/starting her Coumadin and that the Coumadin clinic would not provide this recommendation. Advised she call back to PCP office and I let her know I would forward this message as well. She would like to know what to do with her Coumadin before the weekend.

## 2020-01-21 NOTE — Telephone Encounter (Addendum)
I do not see where we are managing patients coumadin here at [facility].  Appears she has been managed by Cardiology.    Also, her PCP needs to be made aware of the numbness to her foot.   I tried to reach patient to triage her numbness symptoms to determine how acute this may be and obtain further information for better evaluation by PCP.   I was unsuccessful X 2 attempts to reach patient but did leave a message for patient to please call Dr. [name] office as soon as possible to discuss the numbness symptoms that she is having.

## 2020-01-21 NOTE — Telephone Encounter (Signed)
Pt called to inform us that she resumed taking coumadin this morning and has had no nosebleed so far. She did mention that she has been having some numbness in her foot. Wondering if PCP have any insight of the cause? Please advise.   I did say an OV may or may not be needed for evaluation of her feet.

## 2020-01-28 ENCOUNTER — Encounter: Payer: Self-pay | Admitting: Internal Medicine

## 2020-01-28 ENCOUNTER — Ambulatory Visit (INDEPENDENT_AMBULATORY_CARE_PROVIDER_SITE_OTHER): Payer: Medicare Other | Admitting: Internal Medicine

## 2020-01-28 ENCOUNTER — Other Ambulatory Visit: Payer: Self-pay

## 2020-01-28 VITALS — BP 122/72 | HR 89 | Temp 98.0°F | Ht 66.0 in | Wt 126.0 lb

## 2020-01-28 DIAGNOSIS — N1832 Chronic kidney disease, stage 3b: Secondary | ICD-10-CM

## 2020-01-28 DIAGNOSIS — Z5181 Encounter for therapeutic drug level monitoring: Secondary | ICD-10-CM | POA: Diagnosis not present

## 2020-01-28 DIAGNOSIS — I4821 Permanent atrial fibrillation: Secondary | ICD-10-CM

## 2020-01-28 DIAGNOSIS — I10 Essential (primary) hypertension: Secondary | ICD-10-CM

## 2020-01-28 LAB — PROTIME-INR
INR: 1.3 ratio — ABNORMAL HIGH (ref 0.8–1.0)
Prothrombin Time: 14.6 s — ABNORMAL HIGH (ref 9.6–13.1)

## 2020-01-28 LAB — BASIC METABOLIC PANEL
BUN: 30 mg/dL — ABNORMAL HIGH (ref 6–23)
CO2: 31 mEq/L (ref 19–32)
Calcium: 8.8 mg/dL (ref 8.4–10.5)
Chloride: 104 mEq/L (ref 96–112)
Creatinine, Ser: 1.29 mg/dL — ABNORMAL HIGH (ref 0.40–1.20)
GFR: 35.72 mL/min — ABNORMAL LOW (ref >60.00)
Glucose, Bld: 69 mg/dL — ABNORMAL LOW (ref 70–99)
Potassium: 4.4 mEq/L (ref 3.5–5.1)
Sodium: 141 mEq/L (ref 135–145)

## 2020-01-28 NOTE — Progress Notes (Signed)
Subjective:  Patient ID: Alyssa [name], female    DOB: [birth year]  Age: 86 y.o. MRN: [id number]  CC: Atrial Fibrillation  This visit occurred during the SARS-CoV-2 public health emergency.  Safety protocols were in place, including screening questions prior to the visit, additional usage of staff PPE, and extensive cleaning of exam room while observing appropriate contact time as indicated for disinfecting solutions.    HPI  Alyssa [name] presents for f/up -She has had no more nosebleeds.  She restarted Coumadin 6 days ago at 3 mg a day.  She denies palpitations, dizziness, lightheadedness, shortness of breath, edema, or near syncope.  Outpatient Medications Prior to Visit  Medication Sig Dispense Refill  . acetaminophen (TYLENOL) 650 MG CR tablet Take 1,300 mg by mouth 2 (two) times daily.    . bifidobacterium infantis (ALIGN) capsule Take 2 capsules by mouth daily.    . clonazePAM (KLONOPIN) 0.5 MG tablet Take 1 tablet by mouth twice daily as needed for anxiety 180 tablet 1  . Eluxadoline (VIBERZI) 75 MG TABS Take 1 tablet by mouth daily. (Patient taking differently: Take 1 tablet by mouth daily as needed (ibs).) 56 tablet 0  . EUTHYROX 50 MCG tablet TAKE 1 TABLET BY MOUTH ONCE DAILY BEFORE BREAKFAST (Patient taking differently: Take 50 mcg by mouth daily before breakfast.) 90 tablet 1  . furosemide (LASIX) 40 MG tablet Take 1 tablet by mouth once daily 90 tablet 0  . MISC NATURAL PRODUCTS EX Apply 1 application topically in the morning and at bedtime. CBD cream for arthritis pain    . potassium chloride SA (KLOR-CON) 20 MEQ tablet Take 1 tablet by mouth once daily (Patient taking differently: Take 10 mEq by mouth daily.) 90 tablet 0  . warfarin (COUMADIN) 3 MG tablet TAKE 1 TABLET BY MOUTH ONCE DAILY OR AS DIRECTED BY ANTICOAGULATION CLINIC (Patient taking differently: Take 1.5-3 mg by mouth as directed. Take 1 tablet (3 mg) everyday except on Friday Take 1/2 tablet (1.5 mg) AS DIRECTED BY  ANTICOAGULATION CLINIC) 90 tablet 1   No facility-administered medications prior to visit.    ROS Review of Systems  Constitutional: Negative for appetite change, diaphoresis, fatigue and unexpected weight change.  HENT: Negative.  Negative for congestion and nosebleeds.   Eyes: Negative.   Respiratory: Negative for cough, shortness of breath and wheezing.   Cardiovascular: Negative for chest pain, palpitations and leg swelling.  Gastrointestinal: Negative for abdominal pain, blood in stool, constipation, nausea and vomiting.  Endocrine: Negative.   Genitourinary: Negative.  Negative for difficulty urinating and hematuria.  Musculoskeletal: Negative.   Skin: Negative.   Neurological: Negative.  Negative for dizziness, weakness and light-headedness.  Hematological: Negative for adenopathy. Does not bruise/bleed easily.  Psychiatric/Behavioral: Negative.     Objective:  BP 122/72   Pulse 89   Temp 98 F (36.7 C) (Oral)   Ht 5\' 6"  (1.676 m)   Wt 126 lb (57.2 kg)   SpO2 96%   BMI 20.34 kg/m   BP Readings from Last 3 Encounters:  01/28/20 122/72  01/14/20 (!) 146/90  12/29/19 (!) 164/86    Wt Readings from Last 3 Encounters:  01/28/20 126 lb (57.2 kg)  01/14/20 124 lb (56.2 kg)  12/29/19 122 lb (55.3 kg)    Physical Exam Vitals reviewed.  HENT:     Nose: Nose normal.     Mouth/Throat:     Mouth: Mucous membranes are moist.  Eyes:     General: No scleral  icterus.    Conjunctiva/sclera: Conjunctivae normal.  Cardiovascular:     Rate and Rhythm: Normal rate and regular rhythm.     Heart sounds: Murmur heard.   Systolic murmur is present with a grade of 2/6. No gallop.   Pulmonary:     Effort: Pulmonary effort is normal.     Breath sounds: No stridor. No wheezing, rhonchi or rales.  Abdominal:     General: Abdomen is flat.     Palpations: There is no mass.     Tenderness: There is no abdominal tenderness.  Musculoskeletal:        General: Normal range of  motion.     Cervical back: Neck supple.     Right lower leg: No edema.  Lymphadenopathy:     Cervical: No cervical adenopathy.  Skin:    General: Skin is warm and dry.  Neurological:     General: No focal deficit present.     Mental Status: She is alert.     Lab Results  Component Value Date   WBC 3.2 (L) 01/14/2020   HGB 12.9 01/14/2020   HCT 39.4 01/14/2020   PLT 172.0 01/14/2020   GLUCOSE 69 (L) 01/28/2020   CHOL 155 02/11/2014   TRIG 84.0 02/11/2014   HDL 46.10 02/11/2014   LDLCALC 92 02/11/2014   ALT 18 11/13/2019   AST 21 11/13/2019   NA 141 01/28/2020   K 4.4 01/28/2020   CL 104 01/28/2020   CREATININE 1.29 (H) 01/28/2020   BUN 30 (H) 01/28/2020   CO2 31 01/28/2020   TSH 8.08 (H) 01/14/2020   INR 1.3 (H) 01/28/2020   HGBA1C 5.7 06/12/2019    No results found.  Assessment & Plan:   Alyssa [name] was seen today for atrial fibrillation.  Diagnoses and all orders for this visit:  Essential hypertension, benign- Her blood pressure is adequately well controlled. -     Basic metabolic panel; Future -     Basic metabolic panel  Chronic renal impairment, stage 3b ([place])- Her renal function is stable. -     Basic metabolic panel; Future -     Basic metabolic panel  Encounter for therapeutic drug monitoring- Her INR is too low at 1.3.  The desired goal is 2-2.5.  I recommended that she increase the Coumadin dose to 4 mg a day. -     Protime-INR; Future -     Protime-INR -     warfarin (COUMADIN) 4 MG tablet; Take 1 tablet (4 mg total) by mouth daily.  Permanent atrial fibrillation ([name])- She is maintaining sinus rhythm. -     Protime-INR; Future -     Protime-INR -     warfarin (COUMADIN) 4 MG tablet; Take 1 tablet (4 mg total) by mouth daily.   I have discontinued Alyssa [name] warfarin. I am also having her start on warfarin. Additionally, I am having her maintain her acetaminophen, Viberzi, MISC NATURAL PRODUCTS EX, bifidobacterium infantis, potassium chloride  SA, furosemide, Euthyrox, and clonazePAM.  Meds ordered this encounter  Medications  . warfarin (COUMADIN) 4 MG tablet    Sig: Take 1 tablet (4 mg total) by mouth daily.    Dispense:  90 tablet    Refill:  0     Follow-up: Return if symptoms worsen or fail to improve.  [name], MD

## 2020-01-28 NOTE — Patient Instructions (Signed)
Prothrombin Time, International Normalized Ratio Test Why am I having this test? A prothrombin time (pro-time, PT) test may be ordered if:  You have certain medical conditions that cause abnormal bleeding or blood clotting. These can include: ? Liver disease. ? Systemic infection (sepsis). ? Inherited (genetic) bleeding disorders.  You are taking a medicine to prevent excessive blood clotting (anticoagulant), such as warfarin. ? If you are taking warfarin, you will likely be asked to have this test done at regular intervals. The results of this test will help your health care provider determine what dose of warfarin you need based on how quickly or slowly your blood clots. It is very important to have this test done as often as your health care provider recommends. What is being tested? A prothrombin time (pro-time, PT) test measures how many seconds it takes your blood to clot. The international normalized ratio (INR) is a calculation of blood clotting time based on your PT result. Most labs report both PT and INR values when reporting blood clotting times. What kind of sample is taken?  A blood sample is required for this test. It is usually collected by inserting a needle into a blood vessel. Tell a health care provider about:  Any blood disorders you have.  All medicines you are taking, including vitamins, herbs, eye drops, creams, and over-the-counter medicines. Do not stop, add, or change any medicines without letting your health care provider know.  The foods you regularly eat, especially foods that contain moderate or high amounts of vitamin K. It is important to eat a consistent amount of foods rich in vitamin K. Let your health care provider know if you have recently changed your diet.  If you drink alcohol. This can affect your lab results. How are the results reported? Your test results will be reported as values. Your health care provider will compare your results to normal  ranges that were established after testing a large group of people (reference ranges). Reference ranges may vary among different labs and hospitals. For this test, common reference ranges are:  Without anticoagulant treatment (control value): 11.0-12.5 seconds; 85-100%.  INR: 0.8-1.1. If you are taking warfarin, talk with your health care provider about what your INR result should be. Generally, an INR of 2.0-3.0 is desired for blood clot prevention. This depends on your medical conditions. What do the results mean?  A higher than normal PT or INR means that your blood takes longer to form a clot. This can result from: ? Certain medicines. ? Liver disease. ? Lack of certain proteins that form clots (coagulation factors). ? Lack of some vitamins.  A lower than normal PT or INR means that your blood can form a clot easily. This can result from: ? Supplements that contain Vitamin K. ? Medicines that contain estrogen, such as birth control pills or hormone replacement. ? Cancer. ? Some blood disorders (disseminated intravascular coagulation). Talk with your health care provider about what your test results mean. If you are taking warfarin or another anticoagulant, your result ranges may be different. Talk to your health care provider about what your results should be. Questions to ask your health care provider Ask your health care provider or the department that is doing the test:  When will my results be ready?  How will I get my results?  What are my treatment options?  What other tests do I need?  What are my next steps? Summary  A prothrombin time (pro-time, PT) test measures how  many seconds it takes your blood to clot.  You may have this test if you have a medical condition that causes abnormal bleeding or blood clotting, or if you are taking a medicine to prevent abnormal blood clotting.  A test result that is higher than normal indicates that your blood is taking too long  to form a clot. This result may occur because you lack some vitamins, take certain medicines, or have certain medical conditions.  Talk with your health care provider about what your results mean. This information is not intended to replace advice given to you by your health care provider. Make sure you discuss any questions you have with your health care provider. Document Revised: 03/12/2017 Document Reviewed: 03/12/2017 Elsevier Patient Education  2020 [employer].

## 2020-01-29 ENCOUNTER — Telehealth: Payer: Self-pay | Admitting: Internal Medicine

## 2020-01-29 MED ORDER — WARFARIN SODIUM 4 MG PO TABS
4.0000 mg | ORAL_TABLET | Freq: Every day | ORAL | 0 refills | Status: DC
Start: 2020-01-29 — End: 2020-03-17

## 2020-01-29 NOTE — Telephone Encounter (Signed)
Patient is returning and has questions about information below.

## 2020-01-29 NOTE — Telephone Encounter (Signed)
Called pt, LVM stating info below

## 2020-01-29 NOTE — Telephone Encounter (Signed)
Patient calling responding to Dr. [name] result comment, she is taking 3 mgs every day but is only taking 1/2 on Friday. And she has been for the past 6 days. She states she was really sick and had to be off of it so that is why she thinks it is so low.  Wondering what she needs to do.  [phone]

## 2020-01-29 NOTE — Telephone Encounter (Signed)
Increase the coumadin dose to 4 mg QD Rx sent Recheck the INR in 2-3 weeks

## 2020-01-30 DIAGNOSIS — Z85828 Personal history of other malignant neoplasm of skin: Secondary | ICD-10-CM | POA: Diagnosis not present

## 2020-01-30 DIAGNOSIS — C44319 Basal cell carcinoma of skin of other parts of face: Secondary | ICD-10-CM | POA: Diagnosis not present

## 2020-01-30 NOTE — Telephone Encounter (Signed)
Called pt, LVM.   

## 2020-02-04 ENCOUNTER — Telehealth: Payer: Self-pay | Admitting: *Deleted

## 2020-02-04 NOTE — Telephone Encounter (Addendum)
Pt is overdue for a INR check and is in the coumadin clinic follow up box. Per notes in the past month pt's PCP has advised pt to stop/start warfarin. Called and spoke to pt who stated that she restarted taking her warfarin on 12/13 and was taking 1 tablet (3mg ) daily except for 1/2 a tablet on Saturdays. Pt then went to see PCP on 12/21 and while she was there he checked her INR and her INR was 1.3, pt stated that her PCP started her on warfarin 4mg  daily and she has been taking 4 mg daily. Pt stated that she would like to have her INR checked. Scheduled pt to have INR checked on 12/28 at 3:15 at the cardiology office.

## 2020-02-05 ENCOUNTER — Ambulatory Visit (INDEPENDENT_AMBULATORY_CARE_PROVIDER_SITE_OTHER): Payer: Medicare Other | Admitting: *Deleted

## 2020-02-05 ENCOUNTER — Other Ambulatory Visit: Payer: Self-pay

## 2020-02-05 DIAGNOSIS — I4891 Unspecified atrial fibrillation: Secondary | ICD-10-CM | POA: Diagnosis not present

## 2020-02-05 DIAGNOSIS — Z5181 Encounter for therapeutic drug level monitoring: Secondary | ICD-10-CM

## 2020-02-05 LAB — POCT INR: INR: 2.5 (ref 2.0–3.0)

## 2020-02-05 NOTE — Patient Instructions (Signed)
Description   Continue using the 4mg  tablets but start taking Warfarin 4mg  (1 tablet) daily except 2mg  (1/2 tablet) on Sundays and Thursdays. Recheck in 1 week. Call with any new or different medications #[phone]

## 2020-02-12 ENCOUNTER — Ambulatory Visit (INDEPENDENT_AMBULATORY_CARE_PROVIDER_SITE_OTHER): Payer: Medicare Other | Admitting: *Deleted

## 2020-02-12 ENCOUNTER — Other Ambulatory Visit: Payer: Self-pay

## 2020-02-12 DIAGNOSIS — Z5181 Encounter for therapeutic drug level monitoring: Secondary | ICD-10-CM

## 2020-02-12 DIAGNOSIS — I4891 Unspecified atrial fibrillation: Secondary | ICD-10-CM

## 2020-02-12 LAB — POCT INR: INR: 2.5 (ref 2.0–3.0)

## 2020-02-12 NOTE — Patient Instructions (Signed)
Description   Continue taking Warfarin 4mg  (1 tablet) daily except 2mg  (1/2 tablet) on Sundays and Thursdays. Recheck in 2 weeks. Call with any new or different medications #[phone]

## 2020-03-03 ENCOUNTER — Ambulatory Visit (INDEPENDENT_AMBULATORY_CARE_PROVIDER_SITE_OTHER): Payer: Medicare Other | Admitting: *Deleted

## 2020-03-03 ENCOUNTER — Other Ambulatory Visit: Payer: Self-pay

## 2020-03-03 DIAGNOSIS — I4891 Unspecified atrial fibrillation: Secondary | ICD-10-CM | POA: Diagnosis not present

## 2020-03-03 DIAGNOSIS — Z5181 Encounter for therapeutic drug level monitoring: Secondary | ICD-10-CM | POA: Diagnosis not present

## 2020-03-03 LAB — POCT INR: INR: 2.7 (ref 2.0–3.0)

## 2020-03-03 NOTE — Patient Instructions (Signed)
Description   Continue taking Warfarin 4mg  (1 tablet) daily except 2mg  (1/2 tablet) on Sundays and Thursdays. Recheck in 3 weeks. Call with any new or different medications [phone]

## 2020-03-05 ENCOUNTER — Encounter: Payer: Self-pay | Admitting: Internal Medicine

## 2020-03-05 ENCOUNTER — Ambulatory Visit (INDEPENDENT_AMBULATORY_CARE_PROVIDER_SITE_OTHER): Payer: Medicare Other | Admitting: Internal Medicine

## 2020-03-05 ENCOUNTER — Other Ambulatory Visit: Payer: Self-pay

## 2020-03-05 ENCOUNTER — Telehealth: Payer: Self-pay | Admitting: Internal Medicine

## 2020-03-05 VITALS — BP 118/72 | HR 83 | Temp 98.2°F | Resp 16 | Ht 66.0 in | Wt 124.0 lb

## 2020-03-05 DIAGNOSIS — L03116 Cellulitis of left lower limb: Secondary | ICD-10-CM | POA: Diagnosis not present

## 2020-03-05 DIAGNOSIS — L089 Local infection of the skin and subcutaneous tissue, unspecified: Secondary | ICD-10-CM

## 2020-03-05 DIAGNOSIS — T148XXA Other injury of unspecified body region, initial encounter: Secondary | ICD-10-CM

## 2020-03-05 MED ORDER — NUZYRA 150 MG PO TABS: 3.0000 | ORAL_TABLET | Freq: Every day | ORAL | 0 refills | Status: DC

## 2020-03-05 MED ORDER — SIVEXTRO 200 MG PO TABS
1.0000 | ORAL_TABLET | Freq: Every day | ORAL | 0 refills | Status: DC
Start: 2020-03-05 — End: 2020-03-06

## 2020-03-05 MED ORDER — NUZYRA 150 MG PO TABS
2.0000 | ORAL_TABLET | Freq: Every day | ORAL | 0 refills | Status: DC
Start: 2020-03-07 — End: 2020-03-05

## 2020-03-05 NOTE — Telephone Encounter (Signed)
New RX sent to [facility].  

## 2020-03-05 NOTE — Telephone Encounter (Signed)
Pt has been informed.

## 2020-03-05 NOTE — Telephone Encounter (Signed)
Omadacycline Tosylate (NUZYRA) 150 MG TABS After taking this medication today she got very sick, threw up, and is having diarrhea. Patient requesting something different be sent in.

## 2020-03-05 NOTE — Progress Notes (Signed)
Subjective:  Patient ID: Alyssa [name], female    DOB: 1925/01/20  Age: 87 y.o. MRN: [id number]  CC: Wound Infection  This visit occurred during the SARS-CoV-2 public health emergency.  Safety protocols were in place, including screening questions prior to the visit, additional usage of staff PPE, and extensive cleaning of exam room while observing appropriate contact time as indicated for disinfecting solutions.    HPI TYLIN STRADLEY presents for concerns about her LLE- She describes a superficial PW that occurred about 2 weeks ago and she complains that the area is not healing -she has worsening pain, redness, and swelling.  There is been no discharge and she denies fever or chills.  Outpatient Medications Prior to Visit  Medication Sig Dispense Refill  . acetaminophen (TYLENOL) 650 MG CR tablet Take 1,300 mg by mouth 2 (two) times daily.    . bifidobacterium infantis (ALIGN) capsule Take 2 capsules by mouth daily.    . clonazePAM (KLONOPIN) 0.5 MG tablet Take 1 tablet by mouth twice daily as needed for anxiety 180 tablet 1  . Eluxadoline (VIBERZI) 75 MG TABS Take 1 tablet by mouth daily. (Patient taking differently: Take 1 tablet by mouth daily as needed (ibs).) 56 tablet 0  . EUTHYROX 50 MCG tablet TAKE 1 TABLET BY MOUTH ONCE DAILY BEFORE BREAKFAST (Patient taking differently: Take 50 mcg by mouth daily before breakfast.) 90 tablet 1  . furosemide (LASIX) 40 MG tablet Take 1 tablet by mouth once daily 90 tablet 0  . MISC NATURAL PRODUCTS EX Apply 1 application topically in the morning and at bedtime. CBD cream for arthritis pain    . potassium chloride SA (KLOR-CON) 20 MEQ tablet Take 1 tablet by mouth once daily (Patient taking differently: Take 10 mEq by mouth daily.) 90 tablet 0  . warfarin (COUMADIN) 4 MG tablet Take 1 tablet (4 mg total) by mouth daily. 90 tablet 0   No facility-administered medications prior to visit.    ROS Review of Systems  Constitutional: Negative for  chills, fatigue and fever.  HENT: Negative.   Eyes: Negative.   Respiratory: Negative for cough, chest tightness, shortness of breath and wheezing.   Cardiovascular: Negative for chest pain, palpitations and leg swelling.  Gastrointestinal: Negative for abdominal pain, diarrhea, nausea and vomiting.  Endocrine: Negative.   Genitourinary: Negative.  Negative for difficulty urinating.  Musculoskeletal: Negative.  Negative for arthralgias and myalgias.  Skin: Positive for color change and wound.  Neurological: Negative.   Hematological: Negative for adenopathy. Does not bruise/bleed easily.  Psychiatric/Behavioral: Negative.     Objective:  BP 118/72   Pulse 83   Temp 98.2 F (36.8 C) (Oral)   Resp 16   Ht 5\' 6"  (1.676 m)   Wt 124 lb (56.2 kg)   SpO2 97%   BMI 20.01 kg/m   BP Readings from Last 3 Encounters:  03/05/20 118/72  01/28/20 122/72  01/14/20 (!) 146/90    Wt Readings from Last 3 Encounters:  03/05/20 124 lb (56.2 kg)  01/28/20 126 lb (57.2 kg)  01/14/20 124 lb (56.2 kg)    Physical Exam Musculoskeletal:       Legs:     Lab Results  Component Value Date   WBC 3.2 (L) 01/14/2020   HGB 12.9 01/14/2020   HCT 39.4 01/14/2020   PLT 172.0 01/14/2020   GLUCOSE 69 (L) 01/28/2020   CHOL 155 02/11/2014   TRIG 84.0 02/11/2014   HDL 46.10 02/11/2014  LDLCALC 92 02/11/2014   ALT 18 11/13/2019   AST 21 11/13/2019   NA 141 01/28/2020   K 4.4 01/28/2020   CL 104 01/28/2020   CREATININE 1.29 (H) 01/28/2020   BUN 30 (H) 01/28/2020   CO2 31 01/28/2020   TSH 8.08 (H) 01/14/2020   INR 2.7 03/03/2020   HGBA1C 5.7 06/12/2019    No results found.  Assessment & Plan:   Meilyn was seen today for wound infection.  Diagnoses and all orders for this visit:  Cellulitis of left lower extremity- See below -     Discontinue: Omadacycline Tosylate (NUZYRA) 150 MG TABS; Take 3 tablets by mouth daily for 1 day. -     Discontinue: Omadacycline Tosylate (NUZYRA) 150 MG  TABS; Take 2 tablets by mouth daily for 7 days. -     Tedizolid Phosphate (SIVEXTRO) 200 MG TABS; Take 1 tablet by mouth daily for 6 days.  Post-traumatic wound infection- I recommended that we treat this with omadacycline but after taking 1 dose she complained of GI side effects.  I think she needs broad coverage including strep and staph so I recommended that she take a 6-day course of tedizolid. -     Discontinue: Omadacycline Tosylate (NUZYRA) 150 MG TABS; Take 3 tablets by mouth daily for 1 day. -     Discontinue: Omadacycline Tosylate (NUZYRA) 150 MG TABS; Take 2 tablets by mouth daily for 7 days. -     Tedizolid Phosphate (SIVEXTRO) 200 MG TABS; Take 1 tablet by mouth daily for 6 days.   I have discontinued Rutherford Limerick and Elesa Hacker. I am also having her start on Sivextro. Additionally, I am having her maintain her acetaminophen, Viberzi, MISC NATURAL PRODUCTS EX, bifidobacterium infantis, potassium chloride SA, furosemide, Euthyrox, clonazePAM, and warfarin.  Meds ordered this encounter  Medications  . DISCONTD: Omadacycline Tosylate (NUZYRA) 150 MG TABS    Sig: Take 3 tablets by mouth daily for 1 day.    Dispense:  6 tablet    Refill:  0  . DISCONTD: Omadacycline Tosylate (NUZYRA) 150 MG TABS    Sig: Take 2 tablets by mouth daily for 7 days.    Dispense:  14 tablet    Refill:  0  . Tedizolid Phosphate (SIVEXTRO) 200 MG TABS    Sig: Take 1 tablet by mouth daily for 6 days.    Dispense:  6 tablet    Refill:  0     Follow-up: Return in about 1 week (around 03/12/2020).  [name], MD

## 2020-03-05 NOTE — Patient Instructions (Signed)
Wound Infection A wound infection happens when tiny organisms (microorganisms) start to grow in a wound. A wound infection is most often caused by bacteria. Infection can cause the wound to break open or worsen. Wound infection needs treatment. If a wound infection is left untreated, complications can occur. Untreated wound infections may lead to an infection in the bloodstream (septicemia) or a bone infection (osteomyelitis). What are the causes? This condition is most often caused by bacteria growing in a wound. Other microorganisms, like yeast and fungi, can also cause wound infections. What increases the risk? The following factors may make you more likely to develop this condition:  Having a weak body defense system (immune system).  Having diabetes.  Taking steroid medicines for a long time (chronic use).  Smoking.  Being an older person.  Being overweight.  Taking chemotherapy medicines. What are the signs or symptoms? Symptoms of this condition include:  Having more redness, swelling, or pain at the wound site.  Having more blood or fluid at the wound site.  A bad smell coming from a wound or bandage (dressing).  Having a fever.  Feeling tired or fatigued.  Having warmth at or around the wound.  Having pus at the wound site. How is this diagnosed? This condition is diagnosed with a medical history and physical exam. You may also have a wound culture or blood tests or both. How is this treated? This condition is usually treated with an antibiotic medicine.  The infection should improve 24-48 hours after you start antibiotics.  After 24-48 hours, redness around the wound should stop spreading, and the wound should be less painful. Follow these instructions at home: Medicines  Take or apply over-the-counter and prescription medicines only as told by your health care provider.  If you were prescribed an antibiotic medicine, take or apply it as told by your health  care provider. Do not stop using the antibiotic even if you start to feel better. Wound care  Clean the wound each day, or as told by your health care provider. ? Wash the wound with mild soap and water. ? Rinse the wound with water to remove all soap. ? Pat the wound dry with a clean towel. Do not rub it.  Follow instructions from your health care provider about how to take care of your wound. Make sure you: ? Wash your hands with soap and water before and after you change your dressing. If soap and water are not available, use hand sanitizer. ? Change your dressing as told by your health care provider. ? Leave stitches (sutures), skin glue, or adhesive strips in place if your wound has been closed. These skin closures may need to stay in place for 2 weeks or longer. If adhesive strip edges start to loosen and curl up, you may trim the loose edges. Do not remove adhesive strips completely unless your health care provider tells you to do that. Some wounds are left open to heal on their own.  Check your wound every day for signs of infection. Watch for: ? More redness, swelling, or pain. ? More fluid or blood. ? Warmth. ? Pus or a bad smell.   General instructions  Keep the dressing dry until your health care provider says it can be removed.  Do not take baths, swim, or use a hot tub until your health care provider approves. Ask your health care provider if you may take showers. You may only be allowed to take sponge baths.  Raise (  elevate) the injured area above the level of your heart while you are sitting or lying down.  Do not scratch or pick at the wound.  Keep all follow-up visits as told by your health care provider. This is important. Contact a health care provider if:  Your pain is not controlled with medicine.  You have more redness, swelling, or pain around your wound.  You have more fluid or blood coming from your wound.  Your wound feels warm to the touch.  You have  pus coming from your wound.  You continue to notice a bad smell coming from your wound or your dressing.  Your wound that was closed breaks open. Get help right away if:  You have a red streak going away from your wound.  You have a fever. Summary  A wound infection happens when tiny organisms (microorganisms) start to grow in a wound.  This condition is usually treated with an antibiotic medicine.  Follow instructions from your health care provider about how to take care of your wound.  Contact a health care provider if your wound infection does not begin to improve in 24-48 hours, or your symptoms worsen.  Keep all follow-up visits as told by your health care provider. This is important. This information is not intended to replace advice given to you by your health care provider. Make sure you discuss any questions you have with your health care provider. Document Revised: 09/06/2017 Document Reviewed: 09/06/2017 Elsevier Patient Education  2021 Elsevier Inc.  

## 2020-03-06 MED ORDER — SULFAMETHOXAZOLE-TRIMETHOPRIM 800-160 MG PO TABS: 1.0000 | ORAL_TABLET | Freq: Two times a day (BID) | ORAL | 0 refills | Status: DC

## 2020-03-06 MED ORDER — CEFDINIR 300 MG PO CAPS: 300.0000 mg | ORAL_CAPSULE | Freq: Two times a day (BID) | ORAL | 0 refills | Status: DC

## 2020-03-06 NOTE — Addendum Note (Signed)
Addended by: [name] on: 03/06/2020 03:39 PM   Modules accepted: Orders

## 2020-03-06 NOTE — Telephone Encounter (Signed)
Tedizolid Phosphate (SIVEXTRO) 200 MG TABS Both the patient and I have called [facility], [place], [place], [facility] and [facility] out patient pharmacies and no pharmacy has this prescription within 20 miles of [place], is there another medication we can send in for the patient?

## 2020-03-06 NOTE — Telephone Encounter (Signed)
Patient calling to report [facility] doesn't have medication. She is going to check with [facility] and return call to office

## 2020-03-07 ENCOUNTER — Telehealth: Payer: Medicare Other

## 2020-03-12 DIAGNOSIS — R040 Epistaxis: Secondary | ICD-10-CM | POA: Diagnosis not present

## 2020-03-13 ENCOUNTER — Telehealth: Payer: Self-pay

## 2020-03-13 ENCOUNTER — Ambulatory Visit (INDEPENDENT_AMBULATORY_CARE_PROVIDER_SITE_OTHER): Payer: Medicare Other | Admitting: Internal Medicine

## 2020-03-13 ENCOUNTER — Other Ambulatory Visit: Payer: Self-pay

## 2020-03-13 ENCOUNTER — Encounter: Payer: Self-pay | Admitting: Internal Medicine

## 2020-03-13 ENCOUNTER — Observation Stay (HOSPITAL_COMMUNITY)
Admission: EM | Admit: 2020-03-13 | Discharge: 2020-03-14 | Disposition: A | Discharge status: Home / Self Care | Payer: Medicare Other | Attending: Internal Medicine | Admitting: Internal Medicine

## 2020-03-13 ENCOUNTER — Telehealth: Payer: Self-pay | Admitting: Internal Medicine

## 2020-03-13 VITALS — BP 146/60 | HR 61 | Temp 98.0°F | Ht 66.0 in | Wt 124.0 lb

## 2020-03-13 DIAGNOSIS — Z5181 Encounter for therapeutic drug level monitoring: Secondary | ICD-10-CM | POA: Diagnosis not present

## 2020-03-13 DIAGNOSIS — D62 Acute posthemorrhagic anemia: Secondary | ICD-10-CM | POA: Insufficient documentation

## 2020-03-13 DIAGNOSIS — Z79899 Other long term (current) drug therapy: Secondary | ICD-10-CM | POA: Diagnosis not present

## 2020-03-13 DIAGNOSIS — I5030 Unspecified diastolic (congestive) heart failure: Secondary | ICD-10-CM | POA: Insufficient documentation

## 2020-03-13 DIAGNOSIS — Z85850 Personal history of malignant neoplasm of thyroid: Secondary | ICD-10-CM | POA: Diagnosis not present

## 2020-03-13 DIAGNOSIS — Z950 Presence of cardiac pacemaker: Secondary | ICD-10-CM | POA: Diagnosis not present

## 2020-03-13 DIAGNOSIS — R791 Abnormal coagulation profile: Secondary | ICD-10-CM

## 2020-03-13 DIAGNOSIS — I130 Hypertensive heart and chronic kidney disease with heart failure and stage 1 through stage 4 chronic kidney disease, or unspecified chronic kidney disease: Secondary | ICD-10-CM | POA: Insufficient documentation

## 2020-03-13 DIAGNOSIS — Z853 Personal history of malignant neoplasm of breast: Secondary | ICD-10-CM | POA: Insufficient documentation

## 2020-03-13 DIAGNOSIS — I10 Essential (primary) hypertension: Secondary | ICD-10-CM | POA: Diagnosis not present

## 2020-03-13 DIAGNOSIS — N1832 Chronic kidney disease, stage 3b: Secondary | ICD-10-CM | POA: Diagnosis not present

## 2020-03-13 DIAGNOSIS — N179 Acute kidney failure, unspecified: Secondary | ICD-10-CM | POA: Diagnosis present

## 2020-03-13 DIAGNOSIS — E039 Hypothyroidism, unspecified: Secondary | ICD-10-CM | POA: Insufficient documentation

## 2020-03-13 DIAGNOSIS — Z87891 Personal history of nicotine dependence: Secondary | ICD-10-CM | POA: Insufficient documentation

## 2020-03-13 DIAGNOSIS — R040 Epistaxis: Secondary | ICD-10-CM | POA: Diagnosis not present

## 2020-03-13 DIAGNOSIS — Z7901 Long term (current) use of anticoagulants: Secondary | ICD-10-CM | POA: Insufficient documentation

## 2020-03-13 DIAGNOSIS — Z20822 Contact with and (suspected) exposure to covid-19: Secondary | ICD-10-CM | POA: Insufficient documentation

## 2020-03-13 DIAGNOSIS — F411 Generalized anxiety disorder: Secondary | ICD-10-CM | POA: Diagnosis present

## 2020-03-13 DIAGNOSIS — E871 Hypo-osmolality and hyponatremia: Secondary | ICD-10-CM | POA: Diagnosis present

## 2020-03-13 DIAGNOSIS — E778 Other disorders of glycoprotein metabolism: Secondary | ICD-10-CM | POA: Diagnosis present

## 2020-03-13 LAB — CBC WITH DIFFERENTIAL/PLATELET
Basophils Absolute: 0 10*3/uL (ref 0.0–0.1)
Basophils Relative: 0.7 % (ref 0.0–3.0)
Eosinophils Absolute: 0 10*3/uL (ref 0.0–0.7)
Eosinophils Relative: 0.6 % (ref 0.0–5.0)
HCT: 37.9 % (ref 36.0–46.0)
Hemoglobin: 12.5 g/dL (ref 12.0–15.0)
Lymphocytes Relative: 16.4 % (ref 12.0–46.0)
Lymphs Abs: 0.7 10*3/uL (ref 0.7–4.0)
MCHC: 32.9 g/dL (ref 30.0–36.0)
MCV: 93.6 fl (ref 78.0–100.0)
Monocytes Absolute: 0.3 10*3/uL (ref 0.1–1.0)
Monocytes Relative: 7.9 % (ref 3.0–12.0)
Neutro Abs: 3.2 10*3/uL (ref 1.4–7.7)
Neutrophils Relative %: 74.4 % (ref 43.0–77.0)
Platelets: 163 10*3/uL (ref 150.0–400.0)
RBC: 4.05 Mil/uL (ref 3.87–5.11)
RDW: 12 % (ref 11.5–15.5)
WBC: 4.3 10*3/uL (ref 4.0–10.5)

## 2020-03-13 LAB — ABO/RH: ABO/RH(D): A POS

## 2020-03-13 LAB — CBC
HCT: 38.7 % (ref 36.0–46.0)
Hemoglobin: 12.1 g/dL (ref 12.0–15.0)
MCH: 30.4 pg (ref 26.0–34.0)
MCHC: 31.3 g/dL (ref 30.0–36.0)
MCV: 97.2 fL (ref 80.0–100.0)
Platelets: 177 10*3/uL (ref 150–400)
RBC: 3.98 MIL/uL (ref 3.87–5.11)
RDW: 11.9 % (ref 11.5–15.5)
WBC: 4.6 10*3/uL (ref 4.0–10.5)
nRBC: 0 % (ref 0.0–0.2)

## 2020-03-13 LAB — COMPREHENSIVE METABOLIC PANEL
ALT: 22 U/L (ref 0–44)
AST: 29 U/L (ref 15–41)
Albumin: 3.8 g/dL (ref 3.5–5.0)
Alkaline Phosphatase: 51 U/L (ref 38–126)
Anion gap: 10 (ref 5–15)
BUN: 27 mg/dL — ABNORMAL HIGH (ref 8–23)
CO2: 22 mmol/L (ref 22–32)
Calcium: 9 mg/dL (ref 8.9–10.3)
Chloride: 98 mmol/L (ref 98–111)
Creatinine, Ser: 1.74 mg/dL — ABNORMAL HIGH (ref 0.44–1.00)
GFR, Estimated: 27 mL/min — ABNORMAL LOW (ref >60)
Glucose, Bld: 114 mg/dL — ABNORMAL HIGH (ref 70–99)
Potassium: 4.7 mmol/L (ref 3.5–5.1)
Sodium: 130 mmol/L — ABNORMAL LOW (ref 135–145)
Total Bilirubin: 0.6 mg/dL (ref 0.3–1.2)
Total Protein: 6.4 g/dL — ABNORMAL LOW (ref 6.5–8.1)

## 2020-03-13 LAB — HEMOGLOBIN AND HEMATOCRIT, BLOOD
HCT: 33.1 % — ABNORMAL LOW (ref 36.0–46.0)
Hemoglobin: 10.1 g/dL — ABNORMAL LOW (ref 12.0–15.0)

## 2020-03-13 LAB — PROTIME-INR
INR: 9.1 ratio (ref 0.8–1.0)
INR: 5.7 (ref 0.8–1.2)
Prothrombin Time: 100.1 s (ref 9.6–13.1)
Prothrombin Time: 50.1 seconds — ABNORMAL HIGH (ref 11.4–15.2)

## 2020-03-13 LAB — POC OCCULT BLOOD, ED: Fecal Occult Bld: NEGATIVE

## 2020-03-13 MED ORDER — ELUXADOLINE 75 MG PO TABS: 1.0000 | ORAL_TABLET | Freq: Every day | ORAL | Status: DC | PRN

## 2020-03-13 MED ORDER — ACETAMINOPHEN 325 MG PO TABS: 650.0000 mg | ORAL_TABLET | Freq: Four times a day (QID) | ORAL | Status: DC | PRN

## 2020-03-13 MED ORDER — TRANEXAMIC ACID FOR EPISTAXIS
500.0000 mg | Freq: Once | TOPICAL | Status: AC
  Administered 2020-03-13: 500 mg via TOPICAL
  Filled 2020-03-13: qty 10

## 2020-03-13 MED ORDER — LEVOTHYROXINE SODIUM 50 MCG PO TABS
50.0000 ug | ORAL_TABLET | Freq: Every day | ORAL | Status: DC
  Administered 2020-03-14: 50 ug via ORAL
  Filled 2020-03-13: qty 1

## 2020-03-13 MED ORDER — ONDANSETRON HCL 4 MG/2ML IJ SOLN
4.0000 mg | Freq: Four times a day (QID) | INTRAMUSCULAR | Status: DC | PRN
  Administered 2020-03-13: 4 mg via INTRAVENOUS
  Filled 2020-03-13: qty 2

## 2020-03-13 MED ORDER — ACETAMINOPHEN 650 MG RE SUPP: 650.0000 mg | Freq: Four times a day (QID) | RECTAL | Status: DC | PRN

## 2020-03-13 MED ORDER — FENTANYL CITRATE (PF) 100 MCG/2ML IJ SOLN: 12.5000 ug | INTRAMUSCULAR | Status: DC | PRN

## 2020-03-13 MED ORDER — PHYTONADIONE 5 MG PO TABS: 2.5000 mg | ORAL_TABLET | Freq: Once | ORAL | Status: DC

## 2020-03-13 MED ORDER — VITAMIN K1 10 MG/ML IJ SOLN
1.0000 mg | Freq: Once | INTRAVENOUS | Status: AC
  Administered 2020-03-13: 1 mg via INTRAVENOUS
  Filled 2020-03-13: qty 0.1

## 2020-03-13 MED ORDER — LACTATED RINGERS IV SOLN: INTRAVENOUS | Status: AC

## 2020-03-13 MED ORDER — ONDANSETRON HCL 4 MG PO TABS: 4.0000 mg | ORAL_TABLET | Freq: Four times a day (QID) | ORAL | Status: DC | PRN

## 2020-03-13 MED ORDER — CLONAZEPAM 0.5 MG PO TABS
0.5000 mg | ORAL_TABLET | Freq: Every day | ORAL | Status: DC
  Administered 2020-03-13: 0.5 mg via ORAL
  Filled 2020-03-13: qty 1

## 2020-03-13 NOTE — ED Notes (Addendum)
This nurse ambulated to the bathroom however, pt became dizzy and started vomiting dark red blood. This RN got patient back to room into bed and VSS will notified MD   2108 Pt is resting comfortable in bed no complaints at this time

## 2020-03-13 NOTE — Progress Notes (Signed)
Procedure: Anterior/Posterior nasal packing for control of right epistaxis. Indication: Patient well known to me. Presents to ER today with severe right epistaxis. INR on presentation was >9. Anesthesia: Topical xylocaine and Afrin Description: The patient is placed upright in her hospital bed.  Blood clot is suctions from the right nasal cavity. Bleeding is noted from anterior and posterior right nasal cavity.Topical xylocaine and Afrin are applied. A 10 cm Merocel packing is placed in the right nasal cavity.  The patient tolerated the procedure well.

## 2020-03-13 NOTE — Telephone Encounter (Signed)
Opened on error

## 2020-03-13 NOTE — Progress Notes (Signed)
Subjective:  Patient ID: Alyssa [name], female    DOB: 01-25-1925  Age: 87 y.o. MRN: [id number]  CC: Epistaxis  This visit occurred during the SARS-CoV-2 public health emergency.  Safety protocols were in place, including screening questions prior to the visit, additional usage of staff PPE, and extensive cleaning of exam room while observing appropriate contact time as indicated for disinfecting solutions.    HPI Alyssa [name] presents for f/up - I saw her about a week ago for traumatic wound infection with cellulitis on her left lower leg.  She was treated with 2 antibiotics and asked to return last Friday to have her INR checked.  That appt did not take place.  She now tells me for the last few days she has had melena and epistaxis.  She has continued to take both the Coumadin and her antibiotics.  She tells me that she recently called EMS to take her to the hospital and that they were not willing to transport her and told her that if she went there she would have to wait 5 hours.  She has not been able to control the epistaxis.  Outpatient Medications Prior to Visit  Medication Sig Dispense Refill  . acetaminophen (TYLENOL) 650 MG CR tablet Take 1,300 mg by mouth 2 (two) times daily.    . bifidobacterium infantis (ALIGN) capsule Take 2 capsules by mouth daily.    . clonazePAM (KLONOPIN) 0.5 MG tablet Take 1 tablet by mouth twice daily as needed for anxiety 180 tablet 1  . Eluxadoline (VIBERZI) 75 MG TABS Take 1 tablet by mouth daily. (Patient taking differently: Take 1 tablet by mouth daily as needed (ibs).) 56 tablet 0  . EUTHYROX 50 MCG tablet TAKE 1 TABLET BY MOUTH ONCE DAILY BEFORE BREAKFAST (Patient taking differently: Take 50 mcg by mouth daily before breakfast.) 90 tablet 1  . furosemide (LASIX) 40 MG tablet Take 1 tablet by mouth once daily 90 tablet 0  . MISC NATURAL PRODUCTS EX Apply 1 application topically in the morning and at bedtime. CBD cream for arthritis pain    .  potassium chloride SA (KLOR-CON) 20 MEQ tablet Take 1 tablet by mouth once daily (Patient taking differently: Take 10 mEq by mouth daily.) 90 tablet 0  . warfarin (COUMADIN) 4 MG tablet Take 1 tablet (4 mg total) by mouth daily. 90 tablet 0  . cefdinir (OMNICEF) 300 MG capsule Take 1 capsule (300 mg total) by mouth 2 (two) times daily for 7 days. 14 capsule 0  . sulfamethoxazole-trimethoprim (BACTRIM DS) 800-160 MG tablet Take 1 tablet by mouth 2 (two) times daily for 7 days. 14 tablet 0   No facility-administered medications prior to visit.    ROS Review of Systems  Constitutional: Negative for diaphoresis and fatigue.  HENT: Positive for nosebleeds. Negative for facial swelling, sore throat and trouble swallowing.   Eyes: Negative.   Respiratory: Positive for shortness of breath. Negative for cough, chest tightness and wheezing.   Cardiovascular: Negative for chest pain, palpitations and leg swelling.  Gastrointestinal: Negative for abdominal pain, blood in stool, diarrhea, nausea and vomiting.  Endocrine: Negative.   Genitourinary: Negative.  Negative for difficulty urinating.  Musculoskeletal: Negative.   Skin: Negative.   Neurological: Positive for weakness. Negative for dizziness, light-headedness and headaches.  Hematological: Negative for adenopathy. Bruises/bleeds easily.  Psychiatric/Behavioral: Negative.     Objective:  BP (!) 146/60   Pulse 61   Temp 98 F (36.7 C) (Oral)  Ht 5\' 6"  (1.676 m)   Wt 124 lb (56.2 kg)   SpO2 97%   BMI 20.01 kg/m   BP Readings from Last 3 Encounters:  03/13/20 (!) 142/66  03/13/20 (!) 146/60  03/05/20 118/72    Wt Readings from Last 3 Encounters:  03/13/20 124 lb (56.2 kg)  03/05/20 124 lb (56.2 kg)  01/28/20 126 lb (57.2 kg)    Physical Exam Vitals reviewed.  Constitutional:      Appearance: She is ill-appearing (see photo).  HENT:     Nose:     Right Nostril: Epistaxis present.     Left Nostril: Epistaxis present.   Eyes:     General: No scleral icterus.    Conjunctiva/sclera: Conjunctivae normal.  Cardiovascular:     Rate and Rhythm: Normal rate.     Heart sounds: Murmur heard.    Pulmonary:     Breath sounds: No stridor. Examination of the right-middle field reveals rhonchi. Examination of the left-middle field reveals rhonchi. Rhonchi present. No wheezing or rales.  Abdominal:     General: Abdomen is flat.     Palpations: There is no mass.     Tenderness: There is no abdominal tenderness. There is no guarding.  Musculoskeletal:        General: Normal range of motion.     Cervical back: Neck supple.     Right lower leg: No edema.     Left lower leg: No edema.  Lymphadenopathy:     Cervical: No cervical adenopathy.  Skin:    General: Skin is warm and dry.  Neurological:     General: No focal deficit present.  Psychiatric:        Mood and Affect: Mood normal.        Behavior: Behavior normal.     Lab Results  Component Value Date   WBC 4.3 03/13/2020   HGB 12.5 03/13/2020   HCT 37.9 03/13/2020   PLT 163.0 03/13/2020   GLUCOSE 69 (L) 01/28/2020   CHOL 155 02/11/2014   TRIG 84.0 02/11/2014   HDL 46.10 02/11/2014   LDLCALC 92 02/11/2014   ALT 18 11/13/2019   AST 21 11/13/2019   NA 141 01/28/2020   K 4.4 01/28/2020   CL 104 01/28/2020   CREATININE 1.29 (H) 01/28/2020   BUN 30 (H) 01/28/2020   CO2 31 01/28/2020   TSH 8.08 (H) 01/14/2020   INR 9.1 Repeated and verified X2. (HH) 03/13/2020   HGBA1C 5.7 06/12/2019    No results found.  Assessment & Plan:   Elisha was seen today for epistaxis.  Diagnoses and all orders for this visit:  Epistaxis- See below. -     CBC with Differential/Platelet; Future -     Protime-INR; Future -     Protime-INR -     CBC with Differential/Platelet  Encounter for therapeutic drug monitoring -     CBC with Differential/Platelet; Future -     Protime-INR; Future -     Protime-INR -     CBC with Differential/Platelet  Supratherapeutic  international normalized ratio (INR)- Her INR is elevated at 9.1 but she is not anemic.  She has uncontrollable bleeding.  Will transfer her to the emergency department   I have discontinued York Cerise T. Sledd's cefdinir and sulfamethoxazole-trimethoprim. I am also having her maintain her acetaminophen, Viberzi, MISC NATURAL PRODUCTS EX, bifidobacterium infantis, potassium chloride SA, furosemide, Euthyrox, clonazePAM, and warfarin.  No orders of the defined types were placed in this encounter.  Follow-up: No follow-ups on file.  [name], MD

## 2020-03-13 NOTE — ED Notes (Signed)
Patient is resting comfortably., denies any pain, vss on monitor, nadn, bleeding controlled after ent consult, family at bedside, side rails up. Call bell in reach.

## 2020-03-13 NOTE — Telephone Encounter (Signed)
I have reached out to the pt again. She answer and was informed she needed to be seen today. She stated that she would need to arrange for a ride. I emphasized that she needed to be seen today for these symptoms. She expressed understanding and will call the office back as soon as she can find someone to bring her.

## 2020-03-13 NOTE — Patient Instructions (Signed)
Nosebleed, Adult A nosebleed is when blood comes out of the nose. Nosebleeds are common. Usually, they are not a sign of a serious condition. Nosebleeds can happen if a blood vessel in your nose starts to bleed or if the lining of your nose (mucous membrane) cracks. They are commonly caused by:  Allergies.  Colds.  Picking your nose.  Blowing your nose too hard.  An injury from sticking an object into your nose or getting hit in the nose.  Dry or cold air. Less common causes of nosebleeds include:  Toxic fumes.  Something abnormal in the nose or in the air-filled spaces in the bones of the face (sinuses).  Growths in the nose, such as polyps.  Blood thinners or conditions that cause blood to clot slowly.  Certain illnesses or procedures that irritate or dry out the nasal passages. Follow these instructions at home: When you have a nosebleed:  Sit down and tilt your head slightly forward.  Use a clean towel or tissue to pinch your nostrils under the bony part of your nose. After 5 minutes, let go of your nose and see if bleeding starts again. Do not release pressure before that time. If there is still bleeding, repeat the pinching and holding for 5 minutes or until the bleeding stops.  Do not place tissues or gauze in the nose to stop the bleeding.  Avoid lying down and avoid tilting your head backward. That may make blood collect in the throat and cause gagging or coughing.  Use a nasal spray decongestant to help with a nosebleed as told by your health care provider.   After a nosebleed:  Avoid blowing your nose or sniffing for a number of hours.  Avoid straining, lifting, or bending at the waist for several days. You may go back to other normal activities as you are able.  If you are taking aspirin or blood thinners and you have nosebleeds, talk to your health care provider. These medicines make bleeding more likely. ? Ask your health care provider if you should stop  taking the medicines or if you should adjust the dose. ? Do not stop taking medicines that your health care provider has recommended unless he or she tells you to stop taking them.  If your nosebleed was caused by dry mucous membranes, use over-the-counter saline nasal spray or gel and a humidifier as told by your health care provider. This will keep the mucous membranes moist and allow them to heal. If you need to use one of these products: ? Choose one that is water-soluble. ? Use only as much as you need and use it only as often as needed. ? Do not lie down right after you use it.  If you get nosebleeds often, talk with your health care provider about medical treatments. Options may include: ? Nasal cautery. This treatment stops and prevents nosebleeds by using a chemical swab or electrical device to lightly burn tiny blood vessels inside the nose. ? Nasal packing. A gauze or other material is placed in the nose to keep constant pressure on the bleeding area. Contact a health care provider if you:  Have a fever.  Get nosebleeds often or more often than usual.  Bruise very easily.  Have a nosebleed from having something stuck in your nose.  Have bleeding in your mouth.  Vomit or cough up brown material.  Have a nosebleed after you start a new medicine. Get help right away if:  You have a   nosebleed after a fall or a head injury.  Your nosebleed does not go away after 20 minutes.  You feel dizzy or weak.  You have unusual bleeding from other parts of your body.  You have unusual bruising on other parts of your body.  You become sweaty.  You vomit blood. Summary  A nosebleed is when blood comes out of the nose. Common causes include allergies, an injury to the nose, or cold or dry air.  Initial treatment includes applying pressure for 5 minutes.  Moisturizing the nose with saline nasal spray or gel after a nosebleed may help prevent future bleeding.  Get help right  away if your nosebleed does not go away after 20 minutes. This information is not intended to replace advice given to you by your health care provider. Make sure you discuss any questions you have with your health care provider. Document Revised: 11/23/2018 Document Reviewed: 11/23/2018 Elsevier Patient Education  2021 Elsevier Inc.  

## 2020-03-13 NOTE — Telephone Encounter (Signed)
CRITICAL VALUE STICKER  CRITICAL VALUE: Protime 100; INR 9.1  RECEIVER (on-site recipient of call): Elza Rafter rnc  [place] NOTIFIED: 03/13/20 at 1436   MESSENGER (representative from lab): Santiago Glad  MD NOTIFIED: [name]  TIME OF NOTIFICATION: 1428  RESPONSE: Pt is in the emergency department.

## 2020-03-13 NOTE — Telephone Encounter (Signed)
Called pt, LVM stating she needs to be seen today for below listed symptoms.

## 2020-03-13 NOTE — Telephone Encounter (Signed)
Patient states she is having a bloody nose so she doesn't know if she can come in. She said she would call back if its stopped to come in. Expressed to patient that Dr. [name] still would like to see her today and she understood and said she will try to come in if it stops.

## 2020-03-13 NOTE — Telephone Encounter (Signed)
Sounds like a GI bleed pls bring her in

## 2020-03-13 NOTE — H&P (Signed)
History and Physical    Alyssa [name] [id number] DOB: 1925-01-21 DOA: 03/13/2020  PCP: [name], MD  Patient coming from: Home.  I have personally briefly reviewed patient's old medical records in [place]  Chief Complaint: Nosebleed.  HPI: Alyssa [name] is a 87 y.o. female with medical history significant of chronic atrial fibrillation on chronic warfarin therapy, status post pacemaker placement, mitral valve prolapse, history of breast cancer, diverticulosis, fibroma in her lips, GERD, hyperkalemia, hypertension, hypothyroidism, irritable bowel syndrome, internal hemorrhoids, and direct left inguinal hernia, osteoporosis, renal insufficiency, thyroid cancer who is coming to the emergency department due to recurrence of epistaxis.  She just underwent silver nitrate cauterization yesterday with Dr. Benjamine Mola, which unfortunately was not successful.  Dr. Benjamine Mola came and performed nasal packing with partial resolution of her nasal bleeding.  He has asked [place] to monitor overnight.  She has been lightheaded times, likely due to acute blood loss.  She otherwise denies fever, chills, headaches, sinus pressure, rhinorrhea, sore throat, productive cough, wheezing or hemoptysis.  No chest pain, palpitations, dyspnea, diaphoresis, PND, orthopnea or recent pitting edema of the lower extremities.  Denies abdominal pain, nausea, emesis, constipation or hematochezia.  She occasionally gets diarrhea.  She has had recent melena secondary to epistaxis.  No dysuria, frequency or hematuria.  ED Course: Initial vital signs were temperature 98.5 F, pulse 62, respiration 18, BP 142/66 mmHg O2 sat 94% on room air.  The patient received 1 mg of vitamin K IVPB and tranexamic acid 500 mg topically with nasal packing.  Lab work: CBC showed a white count of 4.6, hemoglobin 12.1 g/dL platelets 177.  PT was 50.1 seconds and INR 5.7.  CMP showed a sodium 130 mmol/L.  All other electrolytes are within normal range.   Glucose 114, BUN 27 and creatinine 1.74 mg/dL with a GFR of 27 mg/dL (most recent baseline around 1.3 mg/dL).  Total protein 6.4 g/dL, all other values were within normal range.  Fecal occult blood was negative.  Review of Systems: As per HPI otherwise all other systems reviewed and are negative.  Past Medical History:  Diagnosis Date  . Atrial fibrillation ([place])    pacemaker, chronic anticoag  . Breast cancer ([place])   . Diverticulosis   . Fibroma    inner lips/mouth  . GERD (gastroesophageal reflux disease)   . Hx of breast cancer   . Hyperkalemia   . Hypertension   . Hypothyroidism   . IBS (irritable bowel syndrome)   . Internal hemorrhoids   . Left inguinal hernia    direct  . MVP (mitral valve prolapse)   . Osteoporosis   . Renal insufficiency   . Thyroid cancer [facility])     Past Surgical History:  Procedure Laterality Date  . COLONOSCOPY  10/28/2005   internal hemorrhoids, diverticulosis (same as in 2002 and random bxs negative then)  . EP IMPLANTABLE DEVICE N/A 02/26/2015   Procedure: PPM Generator Changeout;  Surgeon: [name], MD;  Location: [facility];  Service: Cardiovascular;  Laterality: N/A;  . MASTECTOMY  1982   Bilateral  . PACEMAKER INSERTION    . THYROIDECTOMY    . TONSILLECTOMY    . UPPER GASTROINTESTINAL ENDOSCOPY  09/06/2000   normal    Social History  reports that she quit smoking about 66 years ago. She has never used smokeless tobacco. She reports that she does not drink alcohol and does not use drugs.  No Known Allergies  Family  History  Problem Relation Age of Onset  . Colon cancer Father 59  . Heart disease Father   . Stroke Mother   . Arthritis Other   . Hypertension Other   . Esophageal cancer Neg Hx   . Pancreatic cancer Neg Hx   . Kidney disease Neg Hx   . Liver disease Neg Hx   . Diabetes Neg Hx   . Rectal cancer Neg Hx   . Stomach cancer Neg Hx    Prior to Admission medications   Medication Sig Start Date End Date  Taking? Authorizing Provider  acetaminophen (TYLENOL) 650 MG CR tablet Take 1,300 mg by mouth 2 (two) times daily.    [provider]  bifidobacterium infantis (ALIGN) capsule Take 2 capsules by mouth daily.    [provider]  clonazePAM (KLONOPIN) 0.5 MG tablet Take 1 tablet by mouth twice daily as needed for anxiety 01/13/20   [name], MD  Eluxadoline (VIBERZI) 75 MG TABS Take 1 tablet by mouth daily. Patient taking differently: Take 1 tablet by mouth daily as needed (ibs). 09/25/18   [name], MD  EUTHYROX 50 MCG tablet TAKE 1 TABLET BY MOUTH ONCE DAILY BEFORE BREAKFAST Patient taking differently: Take 50 mcg by mouth daily before breakfast. 11/26/19   [name], MD  furosemide (LASIX) 40 MG tablet Take 1 tablet by mouth once daily 10/29/19   [name], MD  MISC NATURAL PRODUCTS EX Apply 1 application topically in the morning and at bedtime. CBD cream for arthritis pain    [provider]  potassium chloride SA (KLOR-CON) 20 MEQ tablet Take 1 tablet by mouth once daily Patient taking differently: Take 10 mEq by mouth daily. 09/21/19   [name], MD  warfarin (COUMADIN) 4 MG tablet Take 1 tablet (4 mg total) by mouth daily. 01/29/20   [name], MD    Physical Exam: Vitals:   03/13/20 1707 03/13/20 1715 03/13/20 1722 03/13/20 1745  BP: (!) 165/65 (!) 160/81 (!) 160/81   Pulse:   64 62  Resp:   20   Temp:      TempSrc:      SpO2:   100% 100%    Constitutional: NAD, calm, comfortable Eyes: PERRL, lids and conjunctivae normal ENMT: Nasal packing in place.  Mucous membranes are dry. Posterior pharynx clear of any exudate or lesions. Neck: normal, supple, no masses, no thyromegaly Respiratory: clear to auscultation bilaterally, no wheezing, no crackles. Normal respiratory effort. No accessory muscle use.  Cardiovascular: Regular rate and rhythm, no murmurs / rubs / gallops. No extremity edema. 2+ pedal pulses. No carotid  bruits.  Abdomen: No distention.  Bowel sounds positive.  Soft, no tenderness, no masses palpated. No hepatosplenomegaly. Musculoskeletal: Generalized weakness.  No clubbing / cyanosis. Good ROM, no contractures. Normal muscle tone.  Skin: Some areas of ecchymosis from venipuncture. Neurologic: CN 2-12 grossly intact. Sensation intact, DTR normal. Strength 5/5 in all 4.  Psychiatric: Normal judgment and insight. Alert and oriented x 3. Normal mood.   Labs on Admission: I have personally reviewed following labs and imaging studies  CBC: Recent Labs  Lab 03/13/20 1309 03/13/20 1427  WBC 4.3 4.6  NEUTROABS 3.2  --   HGB 12.5 12.1  HCT 37.9 38.7  MCV 93.6 97.2  PLT 163.0 009    Basic Metabolic Panel: Recent Labs  Lab 03/13/20 1427  NA 130*  K 4.7  CL 98  CO2 22  GLUCOSE 114*  BUN 27*  CREATININE 1.74*  CALCIUM 9.0    GFR: Estimated Creatinine Clearance: 17.9 mL/min (A) (by C-G formula based on SCr of 1.74 mg/dL (H)).  Liver Function Tests: Recent Labs  Lab 03/13/20 1427  AST 29  ALT 22  ALKPHOS 51  BILITOT 0.6  PROT 6.4*  ALBUMIN 3.8   Radiological Exams on Admission: No results found.  EKG: Independently reviewed. Vent. rate 63 BPM PR interval * ms QRS duration 151 ms QT/QTc 442/453 ms P-R-T axes * -85 82 Ventricular paced rhythm.  Assessment/Plan Principal Problem:   Epistaxis, recurrent   Supratherapeutic international normalized ratio (INR) Observation/telemetry. Continue nasal packing. Hold warfarin. Follow-up PT/INR. Monitor hematocrit and hemoglobin. Acetaminophen as needed. Low-dose fentanyl as needed.  Active Problems:   Acute renal failure superimposed on stage 3b chronic kidney disease ([place]) Secondary to volume depletion. Hold furosemide. Time-limited IV hydration. Monitor intake and output. Follow renal function and electrolytes.    Essential hypertension, benign Hold diuretic. Monitor blood pressure.     Hypothyroidism Continue levothyroxine 50 mcg p.o. daily.    GAD (generalized anxiety disorder) Continue clonazepam as needed.    Hyponatremia Secondary to furosemide. Continue IVF. Follow-up sodium level in AM.    Hypoproteinemia (HCC) Protein supplementation. Consider nutritional services evaluation.    DVT prophylaxis: On warfarin (held). Code Status:   Full code. Family Communication: Disposition Plan:   Patient is from:  Home.  Anticipated DC to:  Home.  Anticipated DC date:  03/14/2020.  Anticipated DC barriers: Clinical status.  Consults called: Admission status:  Observation/telemetry.  Severity of Illness:  High due to persistent epistaxis in the setting of supratherapeutic INR requiring multiple medications and procedures to obtain partial control of bleeding.  ENT has recommended for the patient to be monitored closely in the hospital overnight due to lack of full resolution of epistaxis.  Reubin Milan MD [facility]  How to contact the [facility] Attending or Consulting provider [place] or covering provider during after hours [place], for this patient?   1. Check the care team in [facility] and look for a) attending/consulting TR[facility] provider listed and b) the [facility] team listed 2. Log into [email or web address] and use [place]'s universal password to access. If you do not have the password, please contact the hospital operator. 3. Locate the [facility] provider you are looking for under [facility] and page to a number that you can be directly reached. 4. If you still have difficulty reaching the provider, please page the [facility] (Director on Call) for the Hospitalists listed on amion for assistance.  03/13/2020, 6:40 PM   This document was prepared using Dragon voice recognition software and may contain some unintended transcription errors.

## 2020-03-13 NOTE — ED Triage Notes (Signed)
Pt arrives with [facility] from pcp with nose bleed that has been ongoing most of today, pt also reports black tarry stools for the last 2 days. Pt is on warfarin last inr 2.7 last week.

## 2020-03-13 NOTE — Telephone Encounter (Signed)
Patient calling, states she has been having black stool that floats and a lot of gas. She hasn't had any other symptoms but she wanted to ask if this is something she should be concerned about.  [phone]

## 2020-03-13 NOTE — ED Notes (Signed)
Provider notified of critical INR

## 2020-03-13 NOTE — Telephone Encounter (Signed)
I have spoke to pt's daughter, I have encouraged her to bring her mom at 1pm. She expressed understanding and will do so. Please double book.

## 2020-03-13 NOTE — ED Provider Notes (Addendum)
[facility] EMERGENCY DEPARTMENT Provider Note   CSN: [id number] Arrival date & time: 03/13/20  1412     History Chief Complaint  Patient presents with  . Epistaxis    Alyssa [name] is a 87 y.o. female.  The history is provided by the patient.  Epistaxis Location:  R nare Severity:  Moderate Duration:  7 hours Timing:  Constant Progression:  Unchanged Chronicity:  Recurrent Context: anticoagulants (coumadin)   Relieved by:  Nothing Worsened by:  Nothing Ineffective treatments:  Applying pressure (afrin) Associated symptoms: no blood in oropharynx, no cough, no fever and no sore throat   Risk factors: frequent nosebleeds (saw ENT yesterday and had silver nitrate applied to bleed)        Past Medical History:  Diagnosis Date  . Atrial fibrillation ([place])    pacemaker, chronic anticoag  . Breast cancer ([place])   . Diverticulosis   . Fibroma    inner lips/mouth  . GERD (gastroesophageal reflux disease)   . Hx of breast cancer   . Hyperkalemia   . Hypertension   . Hypothyroidism   . IBS (irritable bowel syndrome)   . Internal hemorrhoids   . Left inguinal hernia    direct  . MVP (mitral valve prolapse)   . Osteoporosis   . Renal insufficiency   . Thyroid cancer [facility])     Patient Active Problem List   Diagnosis Date Noted  . Epistaxis 03/13/2020  . Supratherapeutic international normalized ratio (INR) 03/13/2020  . Post-traumatic wound infection 03/05/2020  . Chronic right-sided low back pain without sciatica 05/09/2019  . Fracture of vertebra due to osteoporosis ([place]) 12/28/2018  . IBS (irritable bowel syndrome) 02/20/2018  . Seasonal allergic rhinitis due to pollen 11/25/2016  . (HFpEF) heart failure with preserved ejection fraction ([place]) 08/13/2016  . GAD (generalized anxiety disorder) 02/18/2016  . Essential hypertension, benign 09/04/2015  . Severe tricuspid regurgitation 04/22/2015  . Complete heart block ([place]) 02/26/2015  .  Encounter for therapeutic drug monitoring 07/23/2013  .  atrial fibrillation 10/11/2012  . Chronic renal insufficiency, stage III (moderate) ([place]) 06/12/2012  . Pacemaker-dual chamber  06/23/2010  . Irritable bowel syndrome 12/01/2009  . GERD 06/10/2009  . Hypothyroidism 02/26/2008  . OSTEOPOROSIS 02/26/2008    Past Surgical History:  Procedure Laterality Date  . COLONOSCOPY  10/28/2005   internal hemorrhoids, diverticulosis (same as in 2002 and random bxs negative then)  . EP IMPLANTABLE DEVICE N/A 02/26/2015   Procedure: PPM Generator Changeout;  Surgeon: [name], MD;  Location: [facility];  Service: Cardiovascular;  Laterality: N/A;  . MASTECTOMY  1982   Bilateral  . PACEMAKER INSERTION    . THYROIDECTOMY    . TONSILLECTOMY    . UPPER GASTROINTESTINAL ENDOSCOPY  09/06/2000   normal     OB History   No obstetric history on file.     Family History  Problem Relation Age of Onset  . Colon cancer Father 49  . Heart disease Father   . Stroke Mother   . Arthritis Other   . Hypertension Other   . Esophageal cancer Neg Hx   . Pancreatic cancer Neg Hx   . Kidney disease Neg Hx   . Liver disease Neg Hx   . Diabetes Neg Hx   . Rectal cancer Neg Hx   . Stomach cancer Neg Hx     Social History   Tobacco Use  . Smoking status: Former Smoker    Quit date:  02/08/1954    Years since quitting: 66.1  . Smokeless tobacco: Never Used  Vaping Use  . Vaping Use: Never used  Substance Use Topics  . Alcohol use: No    Alcohol/week: 0.0 standard drinks  . Drug use: No    Home Medications Prior to Admission medications   Medication Sig Start Date End Date Taking? Authorizing Provider  acetaminophen (TYLENOL) 650 MG CR tablet Take 1,300 mg by mouth 2 (two) times daily.    [provider]  bifidobacterium infantis (ALIGN) capsule Take 2 capsules by mouth daily.    [provider]  clonazePAM (KLONOPIN) 0.5 MG tablet Take 1 tablet by mouth twice  daily as needed for anxiety 01/13/20   [name], MD  Eluxadoline (VIBERZI) 75 MG TABS Take 1 tablet by mouth daily. Patient taking differently: Take 1 tablet by mouth daily as needed (ibs). 09/25/18   [name], MD  EUTHYROX 50 MCG tablet TAKE 1 TABLET BY MOUTH ONCE DAILY BEFORE BREAKFAST Patient taking differently: Take 50 mcg by mouth daily before breakfast. 11/26/19   [name], MD  furosemide (LASIX) 40 MG tablet Take 1 tablet by mouth once daily 10/29/19   [name], MD  MISC NATURAL PRODUCTS EX Apply 1 application topically in the morning and at bedtime. CBD cream for arthritis pain    [provider]  potassium chloride SA (KLOR-CON) 20 MEQ tablet Take 1 tablet by mouth once daily Patient taking differently: Take 10 mEq by mouth daily. 09/21/19   [name], MD  warfarin (COUMADIN) 4 MG tablet Take 1 tablet (4 mg total) by mouth daily. 01/29/20   [name], MD    Allergies    Patient has no known allergies.  Review of Systems   Review of Systems  Constitutional: Negative for chills and fever.  HENT: Positive for nosebleeds. Negative for ear pain and sore throat.   Eyes: Negative for pain and visual disturbance.  Respiratory: Negative for cough and shortness of breath.   Cardiovascular: Negative for chest pain and palpitations.  Gastrointestinal: Positive for blood in stool (thought last two bowel movement were black). Negative for abdominal pain and vomiting.  Genitourinary: Negative for dysuria and hematuria.  Musculoskeletal: Negative for arthralgias and back pain.  Skin: Negative for color change and rash.  Neurological: Negative for seizures and syncope.  All other systems reviewed and are negative.   Physical Exam Updated Vital Signs  ED Triage Vitals  Enc Vitals Group     BP 03/13/20 1416 (!) 142/66     Pulse Rate 03/13/20 1416 62     Resp 03/13/20 1416 18     Temp 03/13/20 1416 98.5 F (36.9 C)     Temp Source 03/13/20  1552 Oral     SpO2 03/13/20 1416 94 %     Weight --      Height --      Head Circumference --      Peak Flow --      Pain Score 03/13/20 1422 0     Pain Loc --      Pain Edu? --      Excl. in [place]? --     Physical Exam Vitals and nursing note reviewed.  Constitutional:      General: She is not in acute distress.    Appearance: She is well-developed and well-nourished. She is not ill-appearing.  HENT:     Head: Normocephalic and atraumatic.  Comments: Patient with brisk bleeding from the right nare, no obvious bleed in the posterior oropharynx Eyes:     Conjunctiva/sclera: Conjunctivae normal.  Cardiovascular:     Rate and Rhythm: Normal rate and regular rhythm.     Heart sounds: No murmur heard.   Pulmonary:     Effort: Pulmonary effort is normal. No respiratory distress.     Breath sounds: Normal breath sounds.  Abdominal:     Palpations: Abdomen is soft.     Tenderness: There is no abdominal tenderness.  Genitourinary:    Rectum: Guaiac result negative (brown stool on exam\).  Musculoskeletal:        General: No edema.     Cervical back: Neck supple.  Skin:    General: Skin is warm and dry.     Capillary Refill: Capillary refill takes less than 2 seconds.  Neurological:     General: No focal deficit present.     Mental Status: She is alert.  Psychiatric:        Mood and Affect: Mood and affect normal.     ED Results / Procedures / Treatments   Labs (all labs ordered are listed, but only abnormal results are displayed) Labs Reviewed  COMPREHENSIVE METABOLIC PANEL - Abnormal; Notable for the following components:      Result Value   Sodium 130 (*)    Glucose, Bld 114 (*)    BUN 27 (*)    Creatinine, Ser 1.74 (*)    Total Protein 6.4 (*)    GFR, Estimated 27 (*)    All other components within normal limits  PROTIME-INR - Abnormal; Notable for the following components:   Prothrombin Time 50.1 (*)    INR 5.7 (*)    All other components within normal  limits  SARS CORONAVIRUS 2 (TAT 6-24 HRS)  CBC  POC OCCULT BLOOD, ED    EKG EKG Interpretation  Date/Time:  Thursday March 13 2020 17:23:28 EST Ventricular Rate:  63 PR Interval:    QRS Duration: 151 QT Interval:  442 QTC Calculation: 453 R Axis:   -85 Text Interpretation: VENTRICULAR PACED RHYTHM Confirmed by Lennice Sites [phone]) on 03/13/2020 5:25:38 PM   Radiology No results found.  Procedures .Critical Care Performed by: Lennice Sites, DO Authorized by: Lennice Sites, DO   Critical care provider statement:    Critical care time (minutes):  35   Critical care was necessary to treat or prevent imminent or life-threatening deterioration of the following conditions: elevated INR, nose bleed, INR reversal.   Critical care was time spent personally by me on the following activities:  Blood draw for specimens, development of treatment plan with patient or surrogate, discussions with consultants, discussions with primary provider, evaluation of patient's response to treatment, examination of patient, obtaining history from patient or surrogate, ordering and performing treatments and interventions, ordering and review of laboratory studies, ordering and review of radiographic studies, pulse oximetry, re-evaluation of patient's condition and review of old charts   I assumed direction of critical care for this patient from another provider in my specialty: no    .Epistaxis Management  Date/Time: 03/13/2020 6:33 PM Performed by: Lennice Sites, DO Authorized by: Lennice Sites, DO   Consent:    Consent obtained:  Verbal   Consent given by:  Patient   Risks, benefits, and alternatives were discussed: yes     Risks discussed:  Bleeding, infection, nasal injury and pain   Alternatives discussed:  No treatment and delayed treatment Universal protocol:  Procedure explained and questions answered to patient or proxy's satisfaction: yes     Relevant documents present and  verified: yes     Immediately prior to procedure, a time out was called: yes     Patient identity confirmed:  Verbally with patient Anesthesia:    Anesthesia method:  None Procedure details:    Treatment site:  R anterior   Treatment method:  Anterior pack and silver nitrate   Treatment complexity:  Extensive   Treatment episode: recurring   Post-procedure details:    Assessment:  Bleeding decreased   Procedure completion:  Tolerated     Medications Ordered in ED Medications  phytonadione (VITAMIN K) 1 mg in dextrose 5 % 50 mL IVPB (1 mg Intravenous New Bag/Given 03/13/20 1740)  tranexamic acid (CYKLOKAPRON) 1000 MG/10ML topical solution 500 mg (500 mg Topical Given 03/13/20 1659)    ED Course  I have reviewed the triage vital signs and the nursing notes.  Pertinent labs & imaging results that were available during my care of the patient were reviewed by me and considered in my medical decision making (see chart for details).    MDM Rules/Calculators/A&P                          KEOSHA ROSSA is a 87 year old female with history of A. fib status post pacemaker on Coumadin who presents to the ED with nosebleed.  Bleeding from the right nare.  I packed the right nare but still blood fairly profusely.  Used TXA and Afrin with minimal relief.  She would intermittently continue to have heavy bleeding.  ENT, Dr. Benjamine Mola was consulted and came into the ED and also perform nasal packing.  INR was elevated to 5.7.  She was given a dose of vitamin K for INR reversal.  Otherwise lab work was unremarkable.  Bleeding somewhat controlled now but given elevated INR she is still at risk to have a fair amount of bleeding.  ENT recommended admission.  She did say that she has had bleeding from the nose the last 2 days.  Dr. Benjamine Mola performed silver nitrate cautery yesterday but obviously should be blood today.  She thought she has some darker stools.  Her stool was brown on exam and her Hemoccult was negative.  I  think dark stools could be secondary to swallowing blood as she has been bleeding.  Have a lower suspicion for GI bleed.  We will continue to trend hemoglobin.  Do not believe she needs any GI consultation at this time as I suspect nosebleed is her major issue at this time.  If she does develop some darker stools may need a GI consultation.  Hemodynamically stable and to be admitted to medicine for further care.  This chart was dictated using voice recognition software.  Despite best efforts to proofread,  errors can occur which can change the documentation meaning.    Final Clinical Impression(s) / ED Diagnoses Final diagnoses:  Epistaxis  Elevated INR    Rx / DC Orders ED Discharge Orders    None       Lennice Sites, DO 03/13/20 [place], [place], DO 03/13/20 4128

## 2020-03-14 ENCOUNTER — Encounter: Payer: Self-pay | Admitting: Internal Medicine

## 2020-03-14 DIAGNOSIS — F411 Generalized anxiety disorder: Secondary | ICD-10-CM

## 2020-03-14 DIAGNOSIS — E778 Other disorders of glycoprotein metabolism: Secondary | ICD-10-CM

## 2020-03-14 DIAGNOSIS — R791 Abnormal coagulation profile: Secondary | ICD-10-CM

## 2020-03-14 DIAGNOSIS — N179 Acute kidney failure, unspecified: Secondary | ICD-10-CM

## 2020-03-14 DIAGNOSIS — N1832 Chronic kidney disease, stage 3b: Secondary | ICD-10-CM

## 2020-03-14 DIAGNOSIS — I10 Essential (primary) hypertension: Secondary | ICD-10-CM | POA: Diagnosis not present

## 2020-03-14 DIAGNOSIS — E871 Hypo-osmolality and hyponatremia: Secondary | ICD-10-CM

## 2020-03-14 DIAGNOSIS — R040 Epistaxis: Secondary | ICD-10-CM | POA: Diagnosis not present

## 2020-03-14 LAB — SARS CORONAVIRUS 2 (TAT 6-24 HRS): SARS Coronavirus 2: NEGATIVE

## 2020-03-14 LAB — COMPREHENSIVE METABOLIC PANEL
ALT: 17 U/L (ref 0–44)
AST: 20 U/L (ref 15–41)
Albumin: 3.3 g/dL — ABNORMAL LOW (ref 3.5–5.0)
Alkaline Phosphatase: 39 U/L (ref 38–126)
Anion gap: 10 (ref 5–15)
BUN: 53 mg/dL — ABNORMAL HIGH (ref 8–23)
CO2: 19 mmol/L — ABNORMAL LOW (ref 22–32)
Calcium: 8.2 mg/dL — ABNORMAL LOW (ref 8.9–10.3)
Chloride: 106 mmol/L (ref 98–111)
Creatinine, Ser: 1.67 mg/dL — ABNORMAL HIGH (ref 0.44–1.00)
GFR, Estimated: 28 mL/min — ABNORMAL LOW (ref >60)
Glucose, Bld: 90 mg/dL (ref 70–99)
Potassium: 4.9 mmol/L (ref 3.5–5.1)
Sodium: 135 mmol/L (ref 135–145)
Total Bilirubin: 0.8 mg/dL (ref 0.3–1.2)
Total Protein: 5.2 g/dL — ABNORMAL LOW (ref 6.5–8.1)

## 2020-03-14 LAB — CBC
HCT: 28.1 % — ABNORMAL LOW (ref 36.0–46.0)
Hemoglobin: 9.2 g/dL — ABNORMAL LOW (ref 12.0–15.0)
MCH: 31.6 pg (ref 26.0–34.0)
MCHC: 32.7 g/dL (ref 30.0–36.0)
MCV: 96.6 fL (ref 80.0–100.0)
Platelets: 134 10*3/uL — ABNORMAL LOW (ref 150–400)
RBC: 2.91 MIL/uL — ABNORMAL LOW (ref 3.87–5.11)
RDW: 12 % (ref 11.5–15.5)
WBC: 3.1 10*3/uL — ABNORMAL LOW (ref 4.0–10.5)
nRBC: 0 % (ref 0.0–0.2)

## 2020-03-14 LAB — PROTIME-INR
INR: 2 — ABNORMAL HIGH (ref 0.8–1.2)
Prothrombin Time: 21.8 seconds — ABNORMAL HIGH (ref 11.4–15.2)

## 2020-03-14 LAB — HEMOGLOBIN AND HEMATOCRIT, BLOOD
HCT: 29 % — ABNORMAL LOW (ref 36.0–46.0)
Hemoglobin: 9.6 g/dL — ABNORMAL LOW (ref 12.0–15.0)

## 2020-03-14 MED ORDER — CEPHALEXIN 250 MG PO CAPS
500.0000 mg | ORAL_CAPSULE | Freq: Two times a day (BID) | ORAL | Status: DC
  Administered 2020-03-14: 500 mg via ORAL
  Filled 2020-03-14: qty 2

## 2020-03-14 MED ORDER — CEPHALEXIN 500 MG PO CAPS: 500.0000 mg | ORAL_CAPSULE | Freq: Two times a day (BID) | ORAL | 0 refills | Status: DC

## 2020-03-14 MED ORDER — NAPHAZOLINE-GLYCERIN 0.012-0.2 % OP SOLN
1.0000 [drp] | Freq: Four times a day (QID) | OPHTHALMIC | Status: DC | PRN
  Filled 2020-03-14: qty 15

## 2020-03-14 NOTE — Progress Notes (Signed)
Subjective: Pt resting comfortably in bed. No significant bleeding overnight.  Objective: Vital signs in last 24 hours: Temp:  [98 F (36.7 C)-98.5 F (36.9 C)] 98.5 F (36.9 C) (02/03 1552) Pulse Rate:  [59-130] 59 (02/04 0900) Resp:  [15-21] 20 (02/04 0900) BP: (106-176)/(44-119) 122/51 (02/04 0900) SpO2:  [92 %-100 %] 92 % (02/04 0900) Weight:  [56.2 kg] 56.2 kg (02/03 1315)  General appearance: alert, cooperative and no distress Head: Normocephalic, without obvious abnormality, atraumatic Eyes: conjunctivae/corneas clear. PERRL, EOM's intact. Fundi benign. Ears: normal TM's and external ear canals both ears Nose: Right nasal packing in place. No bleeding. Throat: lips, mucosa, and tongue normal; teeth and gums normal Neck: no adenopathy, no carotid bruit, no JVD, supple, symmetrical, trachea midline and thyroid not enlarged, symmetric, no tenderness/mass/nodules Skin: Skin color, texture, turgor normal. No rashes or lesions  Recent Labs    03/13/20 1427 03/13/20 2030 03/14/20 0300  WBC 4.6  --  3.1*  HGB 12.1 10.1* 9.2*  HCT 38.7 33.1* 28.1*  PLT 177  --  134*   Recent Labs    03/13/20 1427 03/14/20 0300  NA 130* 135  K 4.7 4.9  CL 98 106  CO2 22 19*  GLUCOSE 114* 90  BUN 27* 53*  CREATININE 1.74* 1.67*  CALCIUM 9.0 8.2*    Medications:  I have reviewed the patient's current medications. Scheduled: . clonazePAM  0.5 mg Oral QHS  . levothyroxine  50 mcg Oral QAC breakfast   Continuous:   Assessment/Plan: Pt with recurrent severe right epistaxis. INR was severely elevated yesterday (9.1 -> 5.7 -> 2.0). - Received vitamin K. - Will leave nasal packing in place for 5 days. - Pt will return to my office next Tuesday for packing removal. - Will need abx coverage (e.g. Keflex) while the packing is in place.   LOS: 0 days   Mayleigh Tetrault W Kortnie Stovall 03/14/2020, 9:11 AM

## 2020-03-14 NOTE — ED Notes (Signed)
Tele  Breakfast Ordered 

## 2020-03-14 NOTE — Discharge Summary (Addendum)
Physician Discharge Summary  Alyssa [name] QVZ:[id number] DOB: May 14, 1924 DOA: 03/13/2020  PCP: [name], MD  Admit date: 03/13/2020 Discharge date: 03/14/2020  Time spent: 45 minutes  Recommendations for Outpatient Follow-up:  Patient will be discharged to home.  Patient will need to follow up with primary care provider within one week of discharge, repeat CBC and BMP.  Follow up with ENT, Dr. Benjamine Mola. Patient should continue medications as prescribed.  Patient should follow a heart healthy diet.   Instructed patient to discuss coumadin with her cardiologist as she and her daughter were unsure about restarting the medication.  Discharge Diagnoses:  Recurrent epistaxis/acute blood loss anemia Acute kidney injury on chronic kidney disease, stage IIIb Essential hypertension Hypothyroid Anxiety Hyponatremia Hypoproteinemia Permanent atrial fibrillation with supratherapeutic INR/complete heart block  Discharge Condition: Stable  Diet recommendation: heart healthy  There were no vitals filed for this visit.  History of present illness:  On 03/13/20 by Dr. Tennis Must Alyssa [name] is a 87 y.o. female with medical history significant of chronic atrial fibrillation on chronic warfarin therapy, status post pacemaker placement, mitral valve prolapse, history of breast cancer, diverticulosis, fibroma in her lips, GERD, hyperkalemia, hypertension, hypothyroidism, irritable bowel syndrome, internal hemorrhoids, and direct left inguinal hernia, osteoporosis, renal insufficiency, thyroid cancer who is coming to the emergency department due to recurrence of epistaxis.  She just underwent silver nitrate cauterization yesterday with Dr. Benjamine Mola, which unfortunately was not successful.  Dr. Benjamine Mola came and performed nasal packing with partial resolution of her nasal bleeding.  He has asked [place] to monitor overnight.  She has been lightheaded times, likely due to acute blood loss.  She otherwise denies fever,  chills, headaches, sinus pressure, rhinorrhea, sore throat, productive cough, wheezing or hemoptysis.  No chest pain, palpitations, dyspnea, diaphoresis, PND, orthopnea or recent pitting edema of the lower extremities.  Denies abdominal pain, nausea, emesis, constipation or hematochezia.  She occasionally gets diarrhea.  She has had recent melena secondary to epistaxis.  No dysuria, frequency or hematuria.  Hospital Course:  Recurrent epistaxis/acute blood loss anemia -Suspect made worse by supratherapeutic INR -ENT consulted and appreciated, currently has nasal packing in place.  Recommended Keflex while packing is in place for approximately 5 days, with outpatient follow-up. -Hemoglobin did drop to 9.6-stable (baseline approximately 12) -FOBT negative -Repeat CBC in 1 week  Acute kidney injury on chronic kidney disease, stage IIIb -Suspect secondary to medications -Creatinine was 1.74 on admission -Patient placed on IV fluids -Kidney function mildly improved to 1.67 (baseline approximately 1.3) -Repeat BMP in 1 week  Essential hypertension -Lasix held, BP stable  Hypothyroid -Continue Synthroid  Anxiety -Continue clonazepam as needed  Hyponatremia -Likely secondary to Lasix -Was placed on IV fluids -Sodium is 130 on admission, now up to 135  Hypoproteinemia -Nutrition consulted  Permanent atrial fibrillation with supratherapeutic INR/complete heart block -Patient with pacemaker placement -Follows with cardiology -Currently on Coumadin which was supratherapeutic on admission- 9.1 -Patient was given vitamin K and tranexamic acid -INR currently 2 -Patient to follow-up with cardiology for INR check on Monday.  She does tell me that she was recently on antibiotic for an infection on her leg and was on this antibiotic for approximately 14 days.  Procedures: None  Consultations: ENT, Dr. Benjamine Mola  Discharge Exam: Vitals:   03/14/20 1200 03/14/20 1400  BP: (!) 137/57 (!)  109/50  Pulse: 61 62  Resp: (!) 21 18  Temp:    SpO2: 97% 94%  General: Well developed, elderly, thin, chronically ill-appearing, NAD  HEENT: NCAT, mucous membranes moist.  Right nasal packing in place  Cardiovascular: S1 S2 auscultated, RRR  Respiratory: Clear to auscultation bilaterally with equal chest rise  Abdomen: Soft, nontender, nondistended, + bowel sounds  Extremities: warm dry without cyanosis clubbing or edema  Neuro: AAOx3, nonfocal  Psych: Normal affect and demeanor with intact judgement and insight  Discharge Instructions Discharge Instructions    Diet - low sodium heart healthy   Complete by: As directed    Discharge instructions   Complete by: As directed    Patient will be discharged to home.  Patient will need to follow up with primary care provider within one week of discharge, repeat CBC and BMP.  Follow up with ENT, Dr. Benjamine Mola. Patient should continue medications as prescribed.  Patient should follow a heart healthy diet. Hold lasix until seen by your primary care physician.   Increase activity slowly   Complete by: As directed      Allergies as of 03/14/2020   No Known Allergies     Medication List    TAKE these medications   acetaminophen 650 MG CR tablet Commonly known as: TYLENOL Take 1,300 mg by mouth 2 (two) times daily.   bifidobacterium infantis capsule Take 2 capsules by mouth daily.   cephALEXin 500 MG capsule Commonly known as: KEFLEX Take 1 capsule (500 mg total) by mouth every 12 (twelve) hours.   clonazePAM 0.5 MG tablet Commonly known as: KLONOPIN Take 1 tablet by mouth twice daily as needed for anxiety What changed:   when to take this  additional instructions   Euthyrox 50 MCG tablet Generic drug: levothyroxine TAKE 1 TABLET BY MOUTH ONCE DAILY BEFORE BREAKFAST What changed: See the new instructions.   furosemide 40 MG tablet Commonly known as: LASIX Take 1 tablet by mouth once daily   MISC NATURAL PRODUCTS  EX Apply 1 application topically in the morning and at bedtime. CBD cream for arthritis pain   potassium chloride SA 20 MEQ tablet Commonly known as: KLOR-CON Take 1 tablet by mouth once daily What changed: how much to take   Viberzi 75 MG Tabs Generic drug: Eluxadoline Take 1 tablet by mouth daily. What changed:   when to take this  reasons to take this   warfarin 4 MG tablet Commonly known as: Coumadin Take as directed. If you are unsure how to take this medication, talk to your nurse or doctor. Original instructions: Take 1 tablet (4 mg total) by mouth daily. What changed:   how much to take  when to take this  additional instructions      No Known Allergies  Follow-up Information    [name], MD. Schedule an appointment as soon as possible for a visit in 1 week(s).   Specialty: Internal Medicine Why: Hospital follow up Contact information: [place] [address] [phone]        [name], MD .   Specialty: Cardiology Contact information: [phone] [place] [address] [phone]        Leta Baptist, MD. Daphane Shepherd on 03/18/2020.   Specialty: Otolaryngology Contact information: [place] [address] [phone]                The results of significant diagnostics from this hospitalization (including imaging, microbiology, ancillary and laboratory) are listed below for reference.    Significant Diagnostic Studies: No results  found.  Microbiology: Recent Results (from the past 240 hour(s))  SARS CORONAVIRUS 2 (TAT 6-24 HRS) Nasopharyngeal Nasopharyngeal Swab     Status: None   Collection Time: 03/13/20  6:06 PM   Specimen: Nasopharyngeal Swab  Result Value Ref Range Status   SARS Coronavirus 2 NEGATIVE NEGATIVE Final    Comment: (NOTE) SARS-CoV-2 target nucleic acids are NOT DETECTED.  The SARS-CoV-2 RNA is generally detectable in upper and lower respiratory  specimens during the acute phase of infection. Negative results do not preclude SARS-CoV-2 infection, do not rule out co-infections with other pathogens, and should not be used as the sole basis for treatment or other patient management decisions. Negative results must be combined with clinical observations, patient history, and epidemiological information. The expected result is Negative.  Fact Sheet for Patients: SugarRoll.be  Fact Sheet for Healthcare Providers: https://www.woods-mathews.com/  This test is not yet approved or cleared by the [place] FDA and  has been authorized for detection and/or diagnosis of SARS-CoV-2 by FDA under an Emergency Use Authorization (EUA). This EUA will remain  in effect (meaning this test can be used) for the duration of the COVID-19 declaration under Se ction 564(b)(1) of the Act, 21 U.S.C. section 360bbb-3(b)(1), unless the authorization is terminated or revoked sooner.  Performed at [facility], [place] [address] [place], [place] [address]      Labs: Basic Metabolic Panel: Recent Labs  Lab 03/13/20 1427 03/14/20 0300  NA 130* 135  K 4.7 4.9  CL 98 106  CO2 22 19*  GLUCOSE 114* 90  BUN 27* 53*  CREATININE 1.74* 1.67*  CALCIUM 9.0 8.2*   Liver Function Tests: Recent Labs  Lab 03/13/20 1427 03/14/20 0300  AST 29 20  ALT 22 17  ALKPHOS 51 39  BILITOT 0.6 0.8  PROT 6.4* 5.2*  ALBUMIN 3.8 3.3*   No results for input(s): LIPASE, AMYLASE in the last 168 hours. No results for input(s): AMMONIA in the last 168 hours. CBC: Recent Labs  Lab 03/13/20 1309 03/13/20 1427 03/13/20 2030 03/14/20 0300 03/14/20 1210  WBC 4.3 4.6  --  3.1*  --   NEUTROABS 3.2  --   --   --   --   HGB 12.5 12.1 10.1* 9.2* 9.6*  HCT 37.9 38.7 33.1* 28.1* 29.0*  MCV 93.6 97.2  --  96.6  --   PLT 163.0 177  --  134*  --    Cardiac Enzymes: No results for input(s): CKTOTAL, CKMB, CKMBINDEX,  TROPONINI in the last 168 hours. BNP: BNP (last 3 results) No results for input(s): BNP in the last 8760 hours.  ProBNP (last 3 results) No results for input(s): PROBNP in the last 8760 hours.  CBG: No results for input(s): GLUCAP in the last 168 hours.     Signed:  Cristal Ford  [facility] 03/14/2020, 2:12 PM

## 2020-03-14 NOTE — ED Notes (Signed)
RN assisted pt to bedside toilet, changed sheets and helped pt get comfortable.

## 2020-03-14 NOTE — ED Notes (Signed)
This RN notified MD pt nose starting to bleed again and also getting pt up to the [facility] stools was dark and tary

## 2020-03-14 NOTE — ED Notes (Signed)
Pt up to bedside commode for a liquid Dk Green/black BM. Linens changed. Pt tolerated well.

## 2020-03-14 NOTE — Discharge Instructions (Signed)
Nosebleed, Adult A nosebleed is when blood comes out of the nose. Nosebleeds are common and can be caused by many things. They are usually not a sign of a serious medical problem. Follow these instructions at home: When you have a nosebleed:  Sit down.  Tilt your head a little forward.  Follow these steps: 1. Pinch your nose with a clean towel or tissue. 2. Keep pinching your nose for 5 minutes. Do not let go. 3. After 5 minutes, let go of your nose. 4. If there is still bleeding, do these steps again. Keep doing these steps until the bleeding stops.  Do not put tissues or other things in your nose to stop the bleeding.  Avoid lying down or putting your head back.  Use a nose spray decongestant as told by your doctor.   After a nosebleed:  Try not to blow your nose or sniffle for several hours.  Try not to strain, lift, or bend at the waist for several days.  Aspirin and blood-thinning medicines make bleeding more likely. If you take these medicines: ? Ask your doctor if you should stop taking them or if you should change how much you take. ? Do not stop taking the medicine unless your doctor tells you to.  If your nosebleed was caused by dryness, use over-the-counter saline nasal spray or gel and humidifier as told by your doctor. This will keep the inside of your nose moist and allow it to heal. If you need to use one of these products: ? Choose one that is water-soluble. ? Use only as much as you need and use it only as often as needed. ? Do not lie down right away after you use it.  If you get nosebleeds often, talk with your doctor about treatments. These may include: ? Nasal cautery. A chemical swab or electrical device is used to lightly burn tiny blood vessels inside the nose. This helps stop or prevent nosebleeds. ? Nasal packing. A gauze or other material is placed in the nose to keep constant pressure on the bleeding area. Contact a doctor if:  You have a  fever.  You get nosebleeds often.  You are getting nosebleeds more often than usual.  You bruise very easily.  You have something stuck in your nose.  You have bleeding in your mouth.  You vomit or cough up brown material.  You get a nosebleed after you start a new medicine. Get help right away if:  You have a nosebleed after you fall or hurt your head.  Your nosebleed does not go away after 20 minutes.  You feel dizzy or weak.  You have unusual bleeding from other parts of your body.  You have unusual bruising on other parts of your body.  You get sweaty.  You vomit blood. Summary  Nosebleeds are common. They are usually not a sign of a serious medical problem.  When you have a nosebleed, sit down and tilt your head a little forward. Pinch your nose with a clean tissue for 5 minutes.  Use saline spray or saline gel and a humidifier as told by your doctor.  Get help right away if your nosebleed does not go away after 20 minutes. This information is not intended to replace advice given to you by your health care provider. Make sure you discuss any questions you have with your health care provider. Document Revised: 11/23/2018 Document Reviewed: 11/23/2018 Elsevier Patient Education  2021 Elsevier Inc.  

## 2020-03-14 NOTE — ED Notes (Signed)
Blood drawn for morning labs. Pt continuously dabbing at nasal packing. Any drainage is light pink in color. MD aware. Will notify MD if drainage becomes bright red.

## 2020-03-15 ENCOUNTER — Encounter: Payer: Self-pay | Admitting: Internal Medicine

## 2020-03-17 ENCOUNTER — Ambulatory Visit (INDEPENDENT_AMBULATORY_CARE_PROVIDER_SITE_OTHER): Payer: Medicare Other | Admitting: *Deleted

## 2020-03-17 ENCOUNTER — Telehealth: Payer: Self-pay | Admitting: Internal Medicine

## 2020-03-17 ENCOUNTER — Other Ambulatory Visit: Payer: Self-pay

## 2020-03-17 DIAGNOSIS — I4891 Unspecified atrial fibrillation: Secondary | ICD-10-CM | POA: Diagnosis not present

## 2020-03-17 DIAGNOSIS — Z5181 Encounter for therapeutic drug level monitoring: Secondary | ICD-10-CM

## 2020-03-17 LAB — POCT INR: INR: 1.3 — AB (ref 2.0–3.0)

## 2020-03-17 MED ORDER — APIXABAN 2.5 MG PO TABS: 2.5000 mg | ORAL_TABLET | Freq: Two times a day (BID) | ORAL | 1 refills | Status: DC

## 2020-03-17 NOTE — Telephone Encounter (Signed)
Patients daughter calling, patient was seen in the hospital on 02.03.22, an appointment was made on 02.28.22 for a hospital f/u, the first available appointment. Patients daughter wondering if she can get in this week because that is what ED docs recommended.

## 2020-03-17 NOTE — Patient Instructions (Addendum)
Description   Start Eliquis 2.5mg  twice a day and stop Warfarin. Anticoagulation Clinic [phone]

## 2020-03-18 ENCOUNTER — Telehealth: Payer: Self-pay | Admitting: Pharmacist

## 2020-03-18 ENCOUNTER — Ambulatory Visit (INDEPENDENT_AMBULATORY_CARE_PROVIDER_SITE_OTHER): Payer: Medicare Other | Admitting: Internal Medicine

## 2020-03-18 ENCOUNTER — Other Ambulatory Visit: Payer: Self-pay

## 2020-03-18 ENCOUNTER — Encounter: Payer: Self-pay | Admitting: Internal Medicine

## 2020-03-18 VITALS — BP 138/62 | HR 84 | Temp 98.4°F | Ht 66.0 in

## 2020-03-18 DIAGNOSIS — R040 Epistaxis: Secondary | ICD-10-CM | POA: Diagnosis not present

## 2020-03-18 DIAGNOSIS — D539 Nutritional anemia, unspecified: Secondary | ICD-10-CM | POA: Insufficient documentation

## 2020-03-18 DIAGNOSIS — D500 Iron deficiency anemia secondary to blood loss (chronic): Secondary | ICD-10-CM

## 2020-03-18 LAB — FERRITIN: Ferritin: 28.1 ng/mL (ref 10.0–291.0)

## 2020-03-18 LAB — CBC WITH DIFFERENTIAL/PLATELET
Basophils Absolute: 0 10*3/uL (ref 0.0–0.1)
Basophils Relative: 0.9 % (ref 0.0–3.0)
Eosinophils Absolute: 0.1 10*3/uL (ref 0.0–0.7)
Eosinophils Relative: 1.7 % (ref 0.0–5.0)
HCT: 29.9 % — ABNORMAL LOW (ref 36.0–46.0)
Hemoglobin: 10 g/dL — ABNORMAL LOW (ref 12.0–15.0)
Lymphocytes Relative: 34.3 % (ref 12.0–46.0)
Lymphs Abs: 1.7 10*3/uL (ref 0.7–4.0)
MCHC: 33.4 g/dL (ref 30.0–36.0)
MCV: 93.6 fl (ref 78.0–100.0)
Monocytes Absolute: 0.4 10*3/uL (ref 0.1–1.0)
Monocytes Relative: 7.1 % (ref 3.0–12.0)
Neutro Abs: 2.8 10*3/uL (ref 1.4–7.7)
Neutrophils Relative %: 56 % (ref 43.0–77.0)
Platelets: 200 10*3/uL (ref 150.0–400.0)
RBC: 3.2 Mil/uL — ABNORMAL LOW (ref 3.87–5.11)
RDW: 12.6 % (ref 11.5–15.5)
WBC: 5 10*3/uL (ref 4.0–10.5)

## 2020-03-18 LAB — IRON: Iron: 19 ug/dL — ABNORMAL LOW (ref 42–145)

## 2020-03-18 LAB — FOLATE: Folate: 12.3 ng/mL (ref >5.9)

## 2020-03-18 LAB — VITAMIN B12: Vitamin B-12: 367 pg/mL (ref 211–911)

## 2020-03-18 NOTE — Chronic Care Management (AMB) (Signed)
Chronic Care Management Pharmacy Assistant   Name: Alyssa [name]  MRN: [id number] DOB: 1924/05/01  Reason for Encounter: Disease State  PCP : [name], MD  Allergies:  No Known Allergies  Medications: Outpatient Encounter Medications as of 03/18/2020  Medication Sig   acetaminophen (TYLENOL) 650 MG CR tablet Take 1,300 mg by mouth 2 (two) times daily.   apixaban (ELIQUIS) 2.5 MG TABS tablet Take 1 tablet (2.5 mg total) by mouth 2 (two) times daily.   bifidobacterium infantis (ALIGN) capsule Take 2 capsules by mouth daily.   cephALEXin (KEFLEX) 500 MG capsule Take 1 capsule (500 mg total) by mouth every 12 (twelve) hours.   clonazePAM (KLONOPIN) 0.5 MG tablet Take 1 tablet by mouth twice daily as needed for anxiety (Patient taking differently: Take 0.5 mg by mouth at bedtime.)   Eluxadoline (VIBERZI) 75 MG TABS Take 1 tablet by mouth daily. (Patient taking differently: Take 1 tablet by mouth daily as needed (ibs).)   EUTHYROX 50 MCG tablet TAKE 1 TABLET BY MOUTH ONCE DAILY BEFORE BREAKFAST (Patient taking differently: Take 50 mcg by mouth daily before breakfast.)   furosemide (LASIX) 40 MG tablet Take 1 tablet by mouth once daily   MISC NATURAL PRODUCTS EX Apply 1 application topically in the morning and at bedtime. CBD cream for arthritis pain   potassium chloride SA (KLOR-CON) 20 MEQ tablet Take 1 tablet by mouth once daily (Patient taking differently: Take 10 mEq by mouth daily.)   No facility-administered encounter medications on file as of 03/18/2020.    Current Diagnosis: Patient Active Problem List   Diagnosis Date Noted   Epistaxis 03/13/2020   Supratherapeutic international normalized ratio (INR) 03/13/2020   Epistaxis, recurrent 03/13/2020   Acute renal failure superimposed on stage 3b chronic kidney disease ([place]) 03/13/2020   Hypoproteinemia ([place]) 03/13/2020   Post-traumatic wound infection 03/05/2020   Chronic right-sided low back pain without  sciatica 05/09/2019   Fracture of vertebra due to osteoporosis ([place]) 12/28/2018   IBS (irritable bowel syndrome) 02/20/2018   Hyponatremia 02/20/2018   Seasonal allergic rhinitis due to pollen 11/25/2016   (HFpEF) heart failure with preserved ejection fraction ([place]) 08/13/2016   GAD (generalized anxiety disorder) 02/18/2016   Essential hypertension, benign 09/04/2015   Severe tricuspid regurgitation 04/22/2015   Complete heart block ([place]) 02/26/2015   Chronic renal insufficiency, stage III (moderate) ([place]) 06/12/2012   Pacemaker-dual chamber  06/23/2010   Irritable bowel syndrome 12/01/2009   GERD 06/10/2009   Hypothyroidism 02/26/2008   OSTEOPOROSIS 02/26/2008     Have you had any problems recently with your health?      Patients daughter stated patient has been feeling weak and tired from all the blood loss from the bloody nose . Patients daughter stated they put a stent in her nose and she is going today to have it taken out . Patients daughter states she sees Dr.Jones at 3:20pm today. Patient daughter states they went to clinic and had a medication change from caumidin to eliquis. Patients daughter stated patient was instructed to stop taking lasix and potassium . However, patients daughter stated patient was having trouble last night sleeping so took a lasix pill at bedtime.   Have you had any problems with your pharmacy?      Patients daughter states she has had no problems with pharmacy   What issues or side effects are you having with your medications?       Patients daughter stated she has had no  side effects  ,  What can we do to take care of you better?       Patients daughter states they have an appointment today with Dr. Sharrell Ku ,[place] Pharmacist Assistant [phone]   Follow-Up:  Pharmacist Review

## 2020-03-18 NOTE — Progress Notes (Signed)
Subjective:  Patient ID: Alyssa [name], female    DOB: 1924/07/15  Age: 87 y.o. MRN: [id number]  CC: Hosptial follow up and Anemia  This visit occurred during the SARS-CoV-2 public health emergency.  Safety protocols were in place, including screening questions prior to the visit, additional usage of staff PPE, and extensive cleaning of exam room while observing appropriate contact time as indicated for disinfecting solutions.    HPI Alyssa [name] presents for f/up -  She was recently admitted for epistaxis and elevated INR in the setting of taking antibiotics to treat a LLE infection.  Her anticoagulation is being transitioned to a DOAC.  She is no longer having nosebleeds.  She saw ENT earlier today and had a large blood vessel cauterized.   Admit date: 03/13/2020 Discharge date: 03/14/2020  Time spent: 45 minutes  Recommendations for Outpatient Follow-up:  Patient will be discharged to home.  Patient will need to follow up with primary care provider within one week of discharge, repeat CBC and BMP.  Follow up with ENT, Dr. Benjamine [name]. Patient should continue medications as prescribed.  Patient should follow a heart healthy diet.   Instructed patient to discuss coumadin with her cardiologist as she and her daughter were unsure about restarting the medication.  Discharge Diagnoses:  Recurrent epistaxis/acute blood loss anemia Acute kidney injury on chronic kidney disease, stage IIIb Essential hypertension Hypothyroid Anxiety Hyponatremia Hypoproteinemia Permanent atrial fibrillation with supratherapeutic INR/complete heart block  Discharge Condition: Stable  Diet recommendation: heart healthy  Outpatient Medications Prior to Visit  Medication Sig Dispense Refill   acetaminophen (TYLENOL) 650 MG CR tablet Take 1,300 mg by mouth 2 (two) times daily.     apixaban (ELIQUIS) 2.5 MG TABS tablet Take 1 tablet (2.5 mg total) by mouth 2 (two) times daily. 180 tablet 1    bifidobacterium infantis (ALIGN) capsule Take 2 capsules by mouth daily.     cephALEXin (KEFLEX) 500 MG capsule Take 1 capsule (500 mg total) by mouth every 12 (twelve) hours. 9 capsule 0   clonazePAM (KLONOPIN) 0.5 MG tablet Take 1 tablet by mouth twice daily as needed for anxiety (Patient taking differently: Take 0.5 mg by mouth at bedtime.) 180 tablet 1   Eluxadoline (VIBERZI) 75 MG TABS Take 1 tablet by mouth daily. (Patient taking differently: Take 1 tablet by mouth daily as needed (ibs).) 56 tablet 0   EUTHYROX 50 MCG tablet TAKE 1 TABLET BY MOUTH ONCE DAILY BEFORE BREAKFAST (Patient taking differently: Take 50 mcg by mouth daily before breakfast.) 90 tablet 1   furosemide (LASIX) 40 MG tablet Take 1 tablet by mouth once daily 90 tablet 0   MISC NATURAL PRODUCTS EX Apply 1 application topically in the morning and at bedtime. CBD cream for arthritis pain     potassium chloride SA (KLOR-CON) 20 MEQ tablet Take 1 tablet by mouth once daily (Patient taking differently: Take 10 mEq by mouth daily.) 90 tablet 0   No facility-administered medications prior to visit.    ROS Review of Systems  Constitutional: Positive for fatigue. Negative for chills, diaphoresis and unexpected weight change.  HENT: Negative.  Negative for nosebleeds, sinus pressure and trouble swallowing.   Eyes: Negative.   Respiratory: Positive for shortness of breath. Negative for chest tightness and wheezing.   Cardiovascular: Negative for chest pain, palpitations and leg swelling.  Gastrointestinal: Negative for abdominal pain, diarrhea and vomiting.  Endocrine: Negative.   Genitourinary: Negative.  Negative for difficulty urinating, dysuria and hematuria.  Musculoskeletal: Negative.   Skin: Positive for pallor.  Neurological: Negative.  Negative for dizziness.  Hematological: Negative for adenopathy. Does not bruise/bleed easily.  Psychiatric/Behavioral: Negative.     Objective:  BP 138/62 (BP Location: Left  Arm, Patient Position: Sitting, Cuff Size: Normal)    Pulse 84    Temp 98.4 F (36.9 C) (Oral)    Ht 5\' 6"  (1.676 m)    SpO2 98%    BMI 20.01 kg/m   BP Readings from Last 3 Encounters:  03/18/20 138/62  03/14/20 (!) 109/50  03/13/20 (!) 146/60    Wt Readings from Last 3 Encounters:  03/13/20 124 lb (56.2 kg)  03/05/20 124 lb (56.2 kg)  01/28/20 126 lb (57.2 kg)    Physical Exam Vitals reviewed.  HENT:     Nose: Nose normal.     Mouth/Throat:     Mouth: Mucous membranes are moist.  Eyes:     General: No scleral icterus.    Conjunctiva/sclera: Conjunctivae normal.  Cardiovascular:     Rate and Rhythm: Normal rate and regular rhythm.     Heart sounds: Murmur heard.    Pulmonary:     Breath sounds: No stridor. No wheezing, rhonchi or rales.  Abdominal:     General: Abdomen is flat.     Palpations: There is no mass.     Tenderness: There is no abdominal tenderness. There is no guarding.  Musculoskeletal:        General: Normal range of motion.     Cervical back: Neck supple.     Right lower leg: No edema.     Left lower leg: Edema present.  Lymphadenopathy:     Cervical: No cervical adenopathy.  Skin:    General: Skin is warm and dry.     Coloration: Skin is pale.  Neurological:     General: No focal deficit present.     Mental Status: She is alert and oriented to person, place, and time.     Lab Results  Component Value Date   WBC 5.0 03/18/2020   HGB 10.0 (L) 03/18/2020   HCT 29.9 (L) 03/18/2020   PLT 200.0 03/18/2020   GLUCOSE 90 03/14/2020   CHOL 155 02/11/2014   TRIG 84.0 02/11/2014   HDL 46.10 02/11/2014   LDLCALC 92 02/11/2014   ALT 17 03/14/2020   AST 20 03/14/2020   NA 135 03/14/2020   K 4.9 03/14/2020   CL 106 03/14/2020   CREATININE 1.67 (H) 03/14/2020   BUN 53 (H) 03/14/2020   CO2 19 (L) 03/14/2020   TSH 8.08 (H) 01/14/2020   INR 1.3 (A) 03/17/2020   HGBA1C 5.7 06/12/2019    No results found.  Assessment & Plan:   Alyssa [name] was seen  today for hosptial follow up and anemia.  Diagnoses and all orders for this visit:  Deficiency anemia- The blood loss has resolved but she has IDA. I have recommended that she receive a series of iron infusions. -     CBC with Differential/Platelet; Future -     Vitamin B12; Future -     Iron; Future -     Folate; Future -     Ferritin; Future -     Ferritin -     Folate -     Iron -     Vitamin B12 -     CBC with Differential/Platelet  Iron deficiency anemia due to chronic blood loss   I am having Alyssa [name] maintain her  acetaminophen, Viberzi, MISC NATURAL PRODUCTS EX, bifidobacterium infantis, potassium chloride SA, furosemide, Euthyrox, clonazePAM, cephALEXin, and apixaban.  No orders of the defined types were placed in this encounter.    Follow-up: No follow-ups on file.  [name], MD

## 2020-03-19 ENCOUNTER — Encounter: Payer: Self-pay | Admitting: Internal Medicine

## 2020-03-19 DIAGNOSIS — D500 Iron deficiency anemia secondary to blood loss (chronic): Secondary | ICD-10-CM | POA: Insufficient documentation

## 2020-03-20 ENCOUNTER — Ambulatory Visit (INDEPENDENT_AMBULATORY_CARE_PROVIDER_SITE_OTHER): Payer: Medicare Other

## 2020-03-20 DIAGNOSIS — I442 Atrioventricular block, complete: Secondary | ICD-10-CM

## 2020-03-20 LAB — CUP PACEART REMOTE DEVICE CHECK
Battery Impedance: 725 Ohm
Battery Remaining Longevity: 68 mo
Battery Voltage: 2.79 V
Brady Statistic RV Percent Paced: 100 %
Date Time Interrogation Session: 20220210095905
Implantable Lead Implant Date: 20000921
Implantable Lead Implant Date: 20000921
Implantable Lead Location: 753859
Implantable Lead Location: 753860
Implantable Pulse Generator Implant Date: 20170118
Lead Channel Impedance Value: 555 Ohm
Lead Channel Impedance Value: 67 Ohm
Lead Channel Pacing Threshold Amplitude: 0.75 V
Lead Channel Pacing Threshold Pulse Width: 0.4 ms
Lead Channel Setting Pacing Amplitude: 2.5 V
Lead Channel Setting Pacing Pulse Width: 0.4 ms
Lead Channel Setting Sensing Sensitivity: 2 mV

## 2020-03-24 ENCOUNTER — Telehealth: Payer: Self-pay

## 2020-03-24 ENCOUNTER — Other Ambulatory Visit: Payer: Self-pay

## 2020-03-24 ENCOUNTER — Telehealth: Payer: Self-pay | Admitting: Internal Medicine

## 2020-03-24 NOTE — Telephone Encounter (Signed)
   Daughter calling, states an iron infusion was to be arranged for patient.   Be schedule

## 2020-03-24 NOTE — Telephone Encounter (Signed)
I have called the Patient Alyssa [name] and scheduled the pt an iron infusion for [facility] 2/21 @ 9am.   Pt is aware, expressed understanding,  and have been provided the address and phone number.

## 2020-03-24 NOTE — Telephone Encounter (Signed)
-----   Message from [name], MD sent at 03/24/2020  2:42 PM EST ----- Regarding: FW: iron infusion Help !  TJ  ----- Message ----- From: Allean Found, CMA Sent: 03/24/2020   1:01 PM EST To: [name], MD Subject: iron infusion                                  Dr. [name], Ms. Massi showed up for an iron infusion at the heart care center we do not do those here. I told her she needed to follow up with your practice and that I would send you a message so she can get scheduled if possible. She seemed very confused if you could have someone from your office reach out to her to help clarify that would be great.  Thanks, Haleigh F RMA [facility]

## 2020-03-26 NOTE — Progress Notes (Signed)
Remote pacemaker transmission.   

## 2020-03-28 ENCOUNTER — Telehealth: Payer: Self-pay | Admitting: Internal Medicine

## 2020-03-28 NOTE — Telephone Encounter (Signed)
Patient called and is requesting a call back in regards to an iron infusion. She can be reached at [phone]. Please advise

## 2020-03-31 ENCOUNTER — Other Ambulatory Visit: Payer: Self-pay

## 2020-03-31 ENCOUNTER — Non-Acute Institutional Stay (HOSPITAL_COMMUNITY)
Admission: RE | Admit: 2020-03-31 | Discharge: 2020-03-31 | Disposition: A | Discharge status: Home / Self Care | Payer: Medicare Other | Source: Ambulatory Visit | Attending: Internal Medicine | Admitting: Internal Medicine

## 2020-03-31 DIAGNOSIS — D500 Iron deficiency anemia secondary to blood loss (chronic): Secondary | ICD-10-CM | POA: Insufficient documentation

## 2020-03-31 MED ORDER — SODIUM CHLORIDE 0.9 % IV SOLN
750.0000 mg | Freq: Once | INTRAVENOUS | Status: AC
  Administered 2020-03-31: 750 mg via INTRAVENOUS
  Filled 2020-03-31: qty 15

## 2020-03-31 MED ORDER — SODIUM CHLORIDE 0.9 % IV SOLN
INTRAVENOUS | Status: DC | PRN
  Administered 2020-03-31: 250 mL via INTRAVENOUS

## 2020-03-31 NOTE — Telephone Encounter (Signed)
Called pt, LVM.   

## 2020-03-31 NOTE — Discharge Instructions (Signed)
Ferric carboxymaltose injection What is this medicine? FERRIC CARBOXYMALTOSE (ferr-ik car-box-ee-mol-toes) is an iron complex. Iron is used to make healthy red blood cells, which carry oxygen and nutrients throughout the body. This medicine is used to treat anemia in people with chronic kidney disease or people who cannot take iron by mouth. This medicine may be used for other purposes; ask your health care provider or pharmacist if you have questions. COMMON BRAND NAME(S): Injectafer What should I tell my health care provider before I take this medicine? They need to know if you have any of these conditions:  high levels of iron in the blood  liver disease  an unusual or allergic reaction to iron, other medicines, foods, dyes, or preservatives  pregnant or trying to get pregnant  breast-feeding How should I use this medicine? This medicine is for infusion into a vein. It is given by a health care professional in a hospital or clinic setting. Talk to your pediatrician regarding the use of this medicine in children. Special care may be needed. Overdosage: If you think you have taken too much of this medicine contact a poison control center or emergency room at once. NOTE: This medicine is only for you. Do not share this medicine with others. What if I miss a dose? Keep appointments for follow-up doses. It is important not to miss your dose. Call your care team if you are unable to keep an appointment. What may interact with this medicine? Do not take this medicine with any of the following medications:  deferoxamine  dimercaprol  other iron products This list may not describe all possible interactions. Give your health care provider a list of all the medicines, herbs, non-prescription drugs, or dietary supplements you use. Also tell them if you smoke, drink alcohol, or use illegal drugs. Some items may interact with your medicine. What should I watch for while using this  medicine? Visit your doctor or health care professional regularly. Tell your doctor if your symptoms do not start to get better or if they get worse. You may need blood work done while you are taking this medicine. You may need to follow a special diet. Talk to your doctor. Foods that contain iron include: whole grains/cereals, dried fruits, beans, or peas, leafy green vegetables, and organ meats (liver, kidney). What side effects may I notice from receiving this medicine? Side effects that you should report to your doctor or health care professional as soon as possible:  allergic reactions like skin rash, itching or hives, swelling of the face, lips, or tongue  dizziness  facial flushing Side effects that usually do not require medical attention (report to your doctor or health care professional if they continue or are bothersome):  changes in taste  constipation  headache  nausea, vomiting  pain, redness, or irritation at site where injected This list may not describe all possible side effects. Call your doctor for medical advice about side effects. You may report side effects to FDA at 1-800-FDA-1088. Where should I keep my medicine? This drug is given in a hospital or clinic and will not be stored at home. NOTE: This sheet is a summary. It may not cover all possible information. If you have questions about this medicine, talk to your doctor, pharmacist, or health care provider.  2021 Elsevier/Gold Standard (2020-01-08 14:00:47)  

## 2020-03-31 NOTE — Progress Notes (Signed)
PATIENT CARE CENTER NOTE  Diagnosis: Iron deficiency anemia due to chronic blood loss (D50.0)   Provider: [name], MD   Procedure: Christeen Douglas infusion    Note: Patient received Injectafer infusion via PIV. Patient tolerated well with no adverse reaction. Observed patient for 30 minutes post-infusion. BP elevated (168/72) post-infusion after patient came back from the rest-room. Patient allowed to rest for 10 minutes, BP rechecked and decreased to 146/63. Discharge instructions given. Patient to come back next week for second infusion. Patient alert, oriented and ambulatory at discharge.

## 2020-04-01 ENCOUNTER — Telehealth: Payer: Self-pay | Admitting: Internal Medicine

## 2020-04-01 MED ORDER — POTASSIUM CHLORIDE CRYS ER 20 MEQ PO TBCR: 20.0000 meq | EXTENDED_RELEASE_TABLET | Freq: Every day | ORAL | 1 refills | Status: DC

## 2020-04-01 NOTE — Telephone Encounter (Signed)
°*  STAT* If patient is at the pharmacy, call can be transferred to refill team.   1. Which medications need to be refilled? (please list name of each medication and dose if known)  potassium chloride SA (KLOR-CON) 20 MEQ tablet  2. Which pharmacy/location (including street and city if local pharmacy) is medication to be sent to? [place], [place] - [address] AT [place]  3. Do they need a 30 day or 90 day supply? [place]

## 2020-04-01 NOTE — Telephone Encounter (Signed)
Pt's medication was sent to pt's pharmacy as requested. Confirmation received.  °

## 2020-04-07 ENCOUNTER — Inpatient Hospital Stay: Payer: Medicare Other | Admitting: Internal Medicine

## 2020-04-07 ENCOUNTER — Other Ambulatory Visit: Payer: Self-pay

## 2020-04-07 ENCOUNTER — Non-Acute Institutional Stay (HOSPITAL_COMMUNITY)
Admission: RE | Admit: 2020-04-07 | Discharge: 2020-04-07 | Disposition: A | Discharge status: Home / Self Care | Payer: Medicare Other | Source: Ambulatory Visit | Attending: Internal Medicine | Admitting: Internal Medicine

## 2020-04-07 DIAGNOSIS — D509 Iron deficiency anemia, unspecified: Secondary | ICD-10-CM | POA: Diagnosis not present

## 2020-04-07 MED ORDER — SODIUM CHLORIDE 0.9 % IV SOLN
750.0000 mg | Freq: Once | INTRAVENOUS | Status: AC
  Administered 2020-04-07: 750 mg via INTRAVENOUS
  Filled 2020-04-07: qty 15

## 2020-04-07 MED ORDER — SODIUM CHLORIDE 0.9 % IV SOLN
INTRAVENOUS | Status: DC | PRN
  Administered 2020-04-07: 250 mL via INTRAVENOUS

## 2020-04-07 NOTE — Discharge Instructions (Signed)
Ferric carboxymaltose injection What is this medicine? FERRIC CARBOXYMALTOSE (ferr-ik car-box-ee-mol-toes) is an iron complex. Iron is used to make healthy red blood cells, which carry oxygen and nutrients throughout the body. This medicine is used to treat anemia in people with chronic kidney disease or people who cannot take iron by mouth. This medicine may be used for other purposes; ask your health care provider or pharmacist if you have questions. COMMON BRAND NAME(S): Injectafer What should I tell my health care provider before I take this medicine? They need to know if you have any of these conditions:  high levels of iron in the blood  liver disease  an unusual or allergic reaction to iron, other medicines, foods, dyes, or preservatives  pregnant or trying to get pregnant  breast-feeding How should I use this medicine? This medicine is for infusion into a vein. It is given by a health care professional in a hospital or clinic setting. Talk to your pediatrician regarding the use of this medicine in children. Special care may be needed. Overdosage: If you think you have taken too much of this medicine contact a poison control center or emergency room at once. NOTE: This medicine is only for you. Do not share this medicine with others. What if I miss a dose? Keep appointments for follow-up doses. It is important not to miss your dose. Call your care team if you are unable to keep an appointment. What may interact with this medicine? Do not take this medicine with any of the following medications:  deferoxamine  dimercaprol  other iron products This list may not describe all possible interactions. Give your health care provider a list of all the medicines, herbs, non-prescription drugs, or dietary supplements you use. Also tell them if you smoke, drink alcohol, or use illegal drugs. Some items may interact with your medicine. What should I watch for while using this  medicine? Visit your doctor or health care professional regularly. Tell your doctor if your symptoms do not start to get better or if they get worse. You may need blood work done while you are taking this medicine. You may need to follow a special diet. Talk to your doctor. Foods that contain iron include: whole grains/cereals, dried fruits, beans, or peas, leafy green vegetables, and organ meats (liver, kidney). What side effects may I notice from receiving this medicine? Side effects that you should report to your doctor or health care professional as soon as possible:  allergic reactions like skin rash, itching or hives, swelling of the face, lips, or tongue  dizziness  facial flushing Side effects that usually do not require medical attention (report to your doctor or health care professional if they continue or are bothersome):  changes in taste  constipation  headache  nausea, vomiting  pain, redness, or irritation at site where injected This list may not describe all possible side effects. Call your doctor for medical advice about side effects. You may report side effects to FDA at 1-800-FDA-1088. Where should I keep my medicine? This drug is given in a hospital or clinic and will not be stored at home. NOTE: This sheet is a summary. It may not cover all possible information. If you have questions about this medicine, talk to your doctor, pharmacist, or health care provider.  2021 Elsevier/Gold Standard (2020-01-08 14:00:47)  

## 2020-04-07 NOTE — Progress Notes (Signed)
PATIENT CARE CENTER NOTE  Diagnosis: Iron deficiency anemia due to chronic blood loss (D50.0)   Provider: [name], MD   Procedure: Christeen Douglas infusion    Note: Patient received Injectafer infusion via PIV. Patient tolerated well with no adverse reaction. Observed patient for 30 minutes post-infusion. Vital signs stable. Discharge instructions given. Patient alert, oriented and ambulatory at discharge.

## 2020-04-11 DIAGNOSIS — R040 Epistaxis: Secondary | ICD-10-CM | POA: Diagnosis not present

## 2020-05-05 ENCOUNTER — Telehealth: Payer: Self-pay | Admitting: Internal Medicine

## 2020-05-05 MED ORDER — FUROSEMIDE 40 MG PO TABS: 40.0000 mg | ORAL_TABLET | Freq: Every day | ORAL | 0 refills | Status: DC

## 2020-05-05 NOTE — Telephone Encounter (Signed)
*  STAT* If patient is at the pharmacy, call can be transferred to refill team.   1. Which medications need to be refilled? (please list name of each medication and dose if known) new prescription for Furosemide- changing pharmacy  2. Which pharmacy/location (including street and city if local pharmacy) is medication to be sent to?[facility] [place], [place]  3. Do they need a 30 day or 90 day supply?90 days and refills

## 2020-05-05 NOTE — Telephone Encounter (Signed)
Pt's medication was sent to pt's pharmacy as requested. Confirmation received.  °

## 2020-05-22 ENCOUNTER — Ambulatory Visit (INDEPENDENT_AMBULATORY_CARE_PROVIDER_SITE_OTHER): Payer: Medicare Other | Admitting: Internal Medicine

## 2020-05-22 ENCOUNTER — Encounter: Payer: Self-pay | Admitting: Internal Medicine

## 2020-05-22 ENCOUNTER — Other Ambulatory Visit: Payer: Self-pay

## 2020-05-22 VITALS — BP 124/80 | HR 90 | Temp 97.8°F | Ht 66.0 in | Wt 121.0 lb

## 2020-05-22 DIAGNOSIS — E039 Hypothyroidism, unspecified: Secondary | ICD-10-CM | POA: Diagnosis not present

## 2020-05-22 DIAGNOSIS — N1832 Chronic kidney disease, stage 3b: Secondary | ICD-10-CM

## 2020-05-22 DIAGNOSIS — I10 Essential (primary) hypertension: Secondary | ICD-10-CM

## 2020-05-22 DIAGNOSIS — D500 Iron deficiency anemia secondary to blood loss (chronic): Secondary | ICD-10-CM | POA: Diagnosis not present

## 2020-05-22 LAB — BASIC METABOLIC PANEL
BUN: 28 mg/dL — ABNORMAL HIGH (ref 6–23)
CO2: 31 mEq/L (ref 19–32)
Calcium: 9 mg/dL (ref 8.4–10.5)
Chloride: 103 mEq/L (ref 96–112)
Creatinine, Ser: 1.21 mg/dL — ABNORMAL HIGH (ref 0.40–1.20)
GFR: 38.49 mL/min — ABNORMAL LOW (ref >60.00)
Glucose, Bld: 86 mg/dL (ref 70–99)
Potassium: 4.1 mEq/L (ref 3.5–5.1)
Sodium: 142 mEq/L (ref 135–145)

## 2020-05-22 LAB — CBC WITH DIFFERENTIAL/PLATELET
Basophils Absolute: 0 10*3/uL (ref 0.0–0.1)
Basophils Relative: 0.8 % (ref 0.0–3.0)
Eosinophils Absolute: 0 10*3/uL (ref 0.0–0.7)
Eosinophils Relative: 1.3 % (ref 0.0–5.0)
HCT: 39.5 % (ref 36.0–46.0)
Hemoglobin: 13 g/dL (ref 12.0–15.0)
Lymphocytes Relative: 29.5 % (ref 12.0–46.0)
Lymphs Abs: 1.1 10*3/uL (ref 0.7–4.0)
MCHC: 32.9 g/dL (ref 30.0–36.0)
MCV: 93.4 fl (ref 78.0–100.0)
Monocytes Absolute: 0.3 10*3/uL (ref 0.1–1.0)
Monocytes Relative: 7.5 % (ref 3.0–12.0)
Neutro Abs: 2.2 10*3/uL (ref 1.4–7.7)
Neutrophils Relative %: 60.9 % (ref 43.0–77.0)
Platelets: 157 10*3/uL (ref 150.0–400.0)
RBC: 4.23 Mil/uL (ref 3.87–5.11)
RDW: 15.3 % (ref 11.5–15.5)
WBC: 3.6 10*3/uL — ABNORMAL LOW (ref 4.0–10.5)

## 2020-05-22 LAB — FERRITIN: Ferritin: 202.3 ng/mL (ref 10.0–291.0)

## 2020-05-22 LAB — TSH: TSH: 5.13 u[IU]/mL — ABNORMAL HIGH (ref 0.35–4.50)

## 2020-05-22 LAB — IRON: Iron: 73 ug/dL (ref 42–145)

## 2020-05-22 NOTE — Patient Instructions (Signed)
Iron Deficiency Anemia, Adult Iron deficiency anemia is a condition in which the concentration of red blood cells or hemoglobin in the blood is below normal because of too little iron. Hemoglobin is a substance in red blood cells that carries oxygen to the body's tissues. When the concentration of red blood cells or hemoglobin is too low, not enough oxygen reaches these tissues. Iron deficiency anemia is usually long-lasting, and it develops over time. It may or may not cause symptoms. It is a common type of anemia. What are the causes? This condition may be caused by: Not enough iron in the diet. Abnormal absorption in the gut. Increased need for iron because of pregnancy or heavy menstrual periods, for females. Cancers of the gastrointestinal system, such as colon cancer. Blood loss caused by bleeding in the intestine. This may be from a gastrointestinal condition like Crohn's disease. Frequent blood draws, such as from blood donation. What increases the risk? The following factors may make you more likely to develop this condition: Being pregnant. Being a teenage girl going through a growth spurt. What are the signs or symptoms? Symptoms of this condition may include: Pale skin, lips, and nail beds. Weakness, dizziness, and getting tired easily. Headache. Shortness of breath when moving or exercising. Cold hands and feet. Fast or irregular heartbeat. Irritability or rapid breathing. These are more common in severe anemia. Mild anemia may not cause any symptoms. How is this diagnosed? This condition is diagnosed based on: Your medical history. A physical exam. Blood tests. You may have additional tests to find the underlying cause of your anemia, such as: Testing for blood in the stool (fecal occult blood test). A procedure to see inside your colon and rectum (colonoscopy). A procedure to see inside your esophagus and stomach (endoscopy). A test in which cells are removed from  bone marrow (bone marrow aspiration) or fluid is removed from the bone marrow to be examined. This is rarely needed. How is this treated? This condition is treated by correcting the cause of your iron deficiency. Treatment may involve: Adding iron-rich foods to your diet. Taking iron supplements. If you are pregnant or breastfeeding, you may need to take extra iron because your normal diet usually does not provide the amount of iron that you need. Increasing vitamin C intake. Vitamin C helps your body absorb iron. Your health care provider may recommend that you take iron supplements along with a glass of orange juice or a vitamin C supplement. Medicines to make heavy menstrual flow lighter. Surgery. You may need repeat blood tests to determine whether treatment is working. If the treatment does not seem to be working, you may need more tests. Follow these instructions at home: Medicines Take over-the-counter and prescription medicines only as told by your health care provider. This includes iron supplements and vitamins. For the best iron absorption, you should take iron supplements when your stomach is empty. If you cannot tolerate them on an empty stomach, you may need to take them with food. Do not drink milk or take antacids at the same time as your iron supplements. Milk and antacids may interfere with iron absorption. Iron supplements may turn stool (feces) a darker color and it may appear black. If you cannot tolerate taking iron supplements by mouth, talk with your health care provider about taking them through an IV or through an injection into a muscle. Eating and drinking Talk with your health care provider before changing your diet. He or she may recommend that   you eat foods that contain a lot of iron, such as: Liver. Low-fat (lean) beef. Breads and cereals that have iron added to them (are fortified). Eggs. Dried fruit. Dark green, leafy vegetables. To help your body use the  iron from iron-rich foods, eat those foods at the same time as fresh fruits and vegetables that are high in vitamin C. Foods that are high in vitamin C include: Oranges. Peppers. Tomatoes. Mangoes. Drink enough fluid to keep your urine pale yellow.   Managing constipation If you are taking an iron supplement, it may cause constipation. To prevent or treat constipation, you may need to: Take over-the-counter or prescription medicines. Eat foods that are high in fiber, such as beans, whole grains, and fresh fruits and vegetables. Limit foods that are high in fat and processed sugars, such as fried or sweet foods. General instructions Return to your normal activities as told by your health care provider. Ask your health care provider what activities are safe for you. Practice good hygiene. Anemia can make you more prone to illness and infection. Keep all follow-up visits as told by your health care provider. This is important. Contact a health care provider if you: Feel nauseous or you vomit. Feel weak. Have unexplained sweating. Develop symptoms of constipation, such as: Having fewer than three bowel movements a week. Straining to have a bowel movement. Having stools that are hard, dry, or larger than normal. Feeling full or bloated. Pain in the lower abdomen. Not feeling relief after having a bowel movement. Get help right away if you: Faint. If this happens, do not drive yourself to the hospital. Have chest pain. Have shortness of breath that: Is severe. Gets worse with physical activity. Have an irregular or rapid heartbeat. Become light-headed when getting up from a sitting or lying down position. These symptoms may represent a serious problem that is an emergency. Do not wait to see if the symptoms will go away. Get medical help right away. Call your local emergency services (911 in the [place] Do not drive yourself to the hospital. Summary Iron deficiency anemia is a condition  in which the concentration of red blood cells or hemoglobin in the blood is below normal because of too little iron. This condition is treated by correcting the cause of your iron deficiency. Take over-the-counter and prescription medicines only as told by your health care provider. This includes iron supplements and vitamins. To help your body use the iron from iron-rich foods, eat those foods at the same time as fresh fruits and vegetables that are high in vitamin C. Get help right away if you have shortness of breath that gets worse with physical activity. This information is not intended to replace advice given to you by your health care provider. Make sure you discuss any questions you have with your health care provider. Document Revised: 10/03/2018 Document Reviewed: 10/03/2018 Elsevier Patient Education  2021 Elsevier Inc. .  

## 2020-05-22 NOTE — Progress Notes (Signed)
Subjective:  Patient ID: Alyssa [name], female    DOB: 1924/03/04  Age: 87 y.o. MRN: [id number]  CC: Hypertension, Anemia, and Hypothyroidism  This visit occurred during the SARS-CoV-2 public health emergency.  Safety protocols were in place, including screening questions prior to the visit, additional usage of staff PPE, and extensive cleaning of exam room while observing appropriate contact time as indicated for disinfecting solutions.    HPI Alyssa [name] presents for f/up -  She has had no recent episodes of blood loss.  She has felt well recently and offers no complaints.  Outpatient Medications Prior to Visit  Medication Sig Dispense Refill  . acetaminophen (TYLENOL) 650 MG CR tablet Take 1,300 mg by mouth 2 (two) times daily.    Marland Kitchen apixaban (ELIQUIS) 2.5 MG TABS tablet Take 1 tablet (2.5 mg total) by mouth 2 (two) times daily. 180 tablet 1  . bifidobacterium infantis (ALIGN) capsule Take 2 capsules by mouth daily.    . clonazePAM (KLONOPIN) 0.5 MG tablet Take 1 tablet by mouth twice daily as needed for anxiety (Patient taking differently: Take 0.5 mg by mouth at bedtime.) 180 tablet 1  . Eluxadoline (VIBERZI) 75 MG TABS Take 1 tablet by mouth daily. (Patient taking differently: Take 1 tablet by mouth daily as needed (ibs).) 56 tablet 0  . EUTHYROX 50 MCG tablet TAKE 1 TABLET BY MOUTH ONCE DAILY BEFORE BREAKFAST (Patient taking differently: Take 50 mcg by mouth daily before breakfast.) 90 tablet 1  . furosemide (LASIX) 40 MG tablet Take 1 tablet (40 mg total) by mouth daily. Please make yearly appt with Dr. Caryl Comes for July 2022 for future refills. Thank you 1st attempt 90 tablet 0  . MISC NATURAL PRODUCTS EX Apply 1 application topically in the morning and at bedtime. CBD cream for arthritis pain    . potassium chloride SA (KLOR-CON) 20 MEQ tablet Take 1 tablet (20 mEq total) by mouth daily. 90 tablet 1  . cephALEXin (KEFLEX) 500 MG capsule Take 1 capsule (500 mg total) by mouth every 12  (twelve) hours. 9 capsule 0   No facility-administered medications prior to visit.    ROS Review of Systems  Constitutional: Negative for appetite change, diaphoresis, fatigue and unexpected weight change.  HENT: Negative.   Eyes: Negative.   Respiratory: Negative for cough, chest tightness, shortness of breath and wheezing.   Cardiovascular: Negative for chest pain, palpitations and leg swelling.  Gastrointestinal: Negative for abdominal pain, blood in stool and diarrhea.  Endocrine: Negative.  Negative for cold intolerance and heat intolerance.  Genitourinary: Negative.  Negative for difficulty urinating.  Musculoskeletal: Negative.  Negative for arthralgias and myalgias.  Skin: Negative.   Neurological: Negative.  Negative for dizziness, weakness and light-headedness.  Hematological: Negative for adenopathy. Does not bruise/bleed easily.  Psychiatric/Behavioral: Negative.     Objective:  BP 124/80   Pulse 90   Temp 97.8 F (36.6 C) (Oral)   Ht 5\' 6"  (1.676 m)   Wt 121 lb (54.9 kg)   SpO2 94%   BMI 19.53 kg/m   BP Readings from Last 3 Encounters:  05/22/20 124/80  04/07/20 (!) 144/67  03/31/20 (!) 146/63    Wt Readings from Last 3 Encounters:  05/22/20 121 lb (54.9 kg)  03/13/20 124 lb (56.2 kg)  03/05/20 124 lb (56.2 kg)    Physical Exam Vitals reviewed.  HENT:     Nose: Nose normal.     Mouth/Throat:     Mouth: Mucous membranes are  moist.  Eyes:     General: No scleral icterus.    Conjunctiva/sclera: Conjunctivae normal.  Cardiovascular:     Rate and Rhythm: Normal rate and regular rhythm.     Heart sounds: Murmur heard.    Pulmonary:     Effort: Pulmonary effort is normal.     Breath sounds: Normal breath sounds. No wheezing, rhonchi or rales.  Abdominal:     General: Abdomen is flat.     Palpations: There is no mass.     Tenderness: There is no abdominal tenderness. There is no guarding.     Hernia: No hernia is present.  Musculoskeletal:         General: Normal range of motion.     Cervical back: Neck supple.     Right lower leg: No edema.     Left lower leg: No edema.  Lymphadenopathy:     Cervical: No cervical adenopathy.  Skin:    General: Skin is warm and dry.     Coloration: Skin is not pale.  Neurological:     General: No focal deficit present.     Mental Status: She is alert.  Psychiatric:        Mood and Affect: Mood normal.        Behavior: Behavior normal.     Lab Results  Component Value Date   WBC 3.6 (L) 05/22/2020   HGB 13.0 05/22/2020   HCT 39.5 05/22/2020   PLT 157.0 05/22/2020   GLUCOSE 86 05/22/2020   CHOL 155 02/11/2014   TRIG 84.0 02/11/2014   HDL 46.10 02/11/2014   LDLCALC 92 02/11/2014   ALT 17 03/14/2020   AST 20 03/14/2020   NA 142 05/22/2020   K 4.1 05/22/2020   CL 103 05/22/2020   CREATININE 1.21 (H) 05/22/2020   BUN 28 (H) 05/22/2020   CO2 31 05/22/2020   TSH 5.13 (H) 05/22/2020   INR 1.3 (A) 03/17/2020   HGBA1C 5.7 06/12/2019    No results found.  Assessment & Plan:   Alyssa [name] was seen today for hypertension, anemia and hypothyroidism.  Diagnoses and all orders for this visit:  Essential hypertension, benign- Her blood pressure is adequately well controlled. -     Basic metabolic panel; Future -     Basic metabolic panel  Acquired hypothyroidism- Her TSH is in an acceptable range.  She will stay on the current dose of levothyroxine. -     TSH; Future -     TSH  Chronic renal impairment, stage 3b ([place])- Her renal function has improved. -     Basic metabolic panel; Future -     Basic metabolic panel  Iron deficiency anemia due to chronic blood loss- Her H&H and iron levels are normal now. -     CBC with Differential/Platelet; Future -     Iron; Future -     Ferritin; Future -     Ferritin -     Iron -     CBC with Differential/Platelet   I have discontinued Alyssa [name]'s cephALEXin. I am also having her maintain her acetaminophen, Viberzi, MISC NATURAL PRODUCTS  EX, bifidobacterium infantis, Euthyrox, clonazePAM, apixaban, potassium chloride SA, and furosemide.  No orders of the defined types were placed in this encounter.    Follow-up: Return in about 4 months (around 09/21/2020).  Alyssa Calico, MD

## 2020-05-24 ENCOUNTER — Encounter: Payer: Self-pay | Admitting: Internal Medicine

## 2020-06-03 ENCOUNTER — Other Ambulatory Visit: Payer: Self-pay | Admitting: Internal Medicine

## 2020-06-03 ENCOUNTER — Telehealth: Payer: Self-pay | Admitting: Internal Medicine

## 2020-06-03 DIAGNOSIS — E039 Hypothyroidism, unspecified: Secondary | ICD-10-CM

## 2020-06-03 MED ORDER — LEVOTHYROXINE SODIUM 50 MCG PO TABS
50.0000 ug | ORAL_TABLET | Freq: Every day | ORAL | 1 refills | Status: DC
Start: 2020-06-03 — End: 2020-11-25

## 2020-06-03 NOTE — Telephone Encounter (Signed)
EUTHYROX 50 MCG tablet [facility] [place] [place] Phone:  [phone]  Fax:  [phone]     Requesting refill

## 2020-06-04 ENCOUNTER — Telehealth: Payer: Medicare Other

## 2020-06-11 ENCOUNTER — Telehealth: Payer: Self-pay | Admitting: Pharmacist

## 2020-06-11 NOTE — Progress Notes (Signed)
    Chronic Care Management Pharmacy Assistant   Name: Alyssa [name]  MRN: [id number] DOB: 06/01/1924  Reason for Encounter: Disease State - General Adherence   Recent office visits:  05/22/20 [name] (PCP) - Hypertension, anemia and hypothyroidism. F/u 4 months.  03/18/20 [name] (PCP) - Anemia.  03/13/20 [name] (PCP) - Epistaxis. Sent to hospital by EMS.  03/05/20 [name] (PCP) - Cellulitis of lower extremity. Start Cefdinir 300 mg & Bactrim 800-160mg . F/u 1 week  01/28/20 [name] (PCP) - Hypertension. Change Warfarin Sodium 4 mg daily.  01/14/20 [name] (PCP) - Epistaxis.   11/13/19 [name] (PCP) - Left flank pain. No changes. F/u in a week.   Recent consult visits:   None noted  Hospital visits:  Medication Reconciliation was completed by comparing discharge summary, patient's EMR and Pharmacy list, and upon discussion with patient.  Admitted to the hospital on 03/13/20 due to Epistaxis. Discharge date was 03/13/20. Discharged from [facility].     Medications that remain the same after Hospital Discharge:??  -All other medications will remain the same.    Medications: Outpatient Encounter Medications as of 06/11/2020  Medication Sig  . acetaminophen (TYLENOL) 650 MG CR tablet Take 1,300 mg by mouth 2 (two) times daily.  Marland Kitchen apixaban (ELIQUIS) 2.5 MG TABS tablet Take 1 tablet (2.5 mg total) by mouth 2 (two) times daily.  . bifidobacterium infantis (ALIGN) capsule Take 2 capsules by mouth daily.  . clonazePAM (KLONOPIN) 0.5 MG tablet Take 1 tablet by mouth twice daily as needed for anxiety (Patient taking differently: Take 0.5 mg by mouth at bedtime.)  . Eluxadoline (VIBERZI) 75 MG TABS Take 1 tablet by mouth daily. (Patient taking differently: Take 1 tablet by mouth daily as needed (ibs).)  . furosemide (LASIX) 40 MG tablet Take 1 tablet (40 mg total) by mouth daily. Please make yearly appt with Dr. Caryl Comes for July 2022 for future refills. Thank you 1st attempt  . levothyroxine (EUTHYROX) 50  MCG tablet Take 1 tablet (50 mcg total) by mouth daily before breakfast.  . MISC NATURAL PRODUCTS EX Apply 1 application topically in the morning and at bedtime. CBD cream for arthritis pain  . potassium chloride SA (KLOR-CON) 20 MEQ tablet Take 1 tablet (20 mEq total) by mouth daily.   No facility-administered encounter medications on file as of 06/11/2020.    Have you had any problems recently with your health? Patient stated No, but her son had just recently passed away.   Have you had any problems with your pharmacy? Patient state not at this time.  What issues or side effects are you having with your medications? Patient states not at this time.  What would you like me to pass along to [facility] for them to help you with?  Patient states not at this time.  What can we do to take care of you better? Patient states not at this time.   Star Rating Drugs: None  [name], RMA Clinical Pharmacists Assistant [phone]  Time Spent: 64

## 2020-06-19 ENCOUNTER — Ambulatory Visit (INDEPENDENT_AMBULATORY_CARE_PROVIDER_SITE_OTHER): Payer: Medicare Other

## 2020-06-19 DIAGNOSIS — I442 Atrioventricular block, complete: Secondary | ICD-10-CM | POA: Diagnosis not present

## 2020-06-19 LAB — CUP PACEART REMOTE DEVICE CHECK
Battery Impedance: 882 Ohm
Battery Remaining Longevity: 62 mo
Battery Voltage: 2.78 V
Brady Statistic RV Percent Paced: 100 %
Date Time Interrogation Session: 20220512082030
Implantable Lead Implant Date: 20000921
Implantable Lead Implant Date: 20000921
Implantable Lead Location: 753859
Implantable Lead Location: 753860
Implantable Pulse Generator Implant Date: 20170118
Lead Channel Impedance Value: 575 Ohm
Lead Channel Impedance Value: 67 Ohm
Lead Channel Pacing Threshold Amplitude: 0.75 V
Lead Channel Pacing Threshold Pulse Width: 0.4 ms
Lead Channel Setting Pacing Amplitude: 2.5 V
Lead Channel Setting Pacing Pulse Width: 0.4 ms
Lead Channel Setting Sensing Sensitivity: 2 mV

## 2020-07-10 DIAGNOSIS — R040 Epistaxis: Secondary | ICD-10-CM | POA: Diagnosis not present

## 2020-07-10 DIAGNOSIS — J310 Chronic rhinitis: Secondary | ICD-10-CM | POA: Diagnosis not present

## 2020-07-11 NOTE — Progress Notes (Signed)
Remote pacemaker transmission.   

## 2020-07-14 ENCOUNTER — Telehealth: Payer: Self-pay | Admitting: Internal Medicine

## 2020-07-14 ENCOUNTER — Other Ambulatory Visit: Payer: Self-pay | Admitting: Internal Medicine

## 2020-07-14 DIAGNOSIS — F411 Generalized anxiety disorder: Secondary | ICD-10-CM

## 2020-07-14 MED ORDER — CLONAZEPAM 0.5 MG PO TABS: 0.5000 mg | ORAL_TABLET | Freq: Two times a day (BID) | ORAL | 1 refills | Status: DC | PRN

## 2020-07-14 NOTE — Telephone Encounter (Signed)
1.Medication Requested: clonazePAM (KLONOPIN) 0.5 MG tablet    2. Pharmacy (Name, [place]): [facility] [place] - [address]  3. On Med List: yes   4. Last Visit with PCP: 05-22-20  5. Next visit date with PCP: n/a    Agent: Please be advised that RX refills may take up to 3 business days. We ask that you follow-up with your pharmacy.

## 2020-07-22 ENCOUNTER — Ambulatory Visit: Payer: Medicare Other | Admitting: Internal Medicine

## 2020-07-22 ENCOUNTER — Telehealth: Payer: Self-pay | Admitting: Internal Medicine

## 2020-07-22 DIAGNOSIS — Z0289 Encounter for other administrative examinations: Secondary | ICD-10-CM

## 2020-07-22 NOTE — Telephone Encounter (Signed)
Called pt,LVM to call and schedule appointment tomorrow at 11:20 with Dr. [name]. Okay per [name] to [place]

## 2020-07-22 NOTE — Telephone Encounter (Signed)
   Patient calling to report elevated blood pressure and palpitations  172/93  Call transferred to [facility]

## 2020-07-23 ENCOUNTER — Ambulatory Visit (INDEPENDENT_AMBULATORY_CARE_PROVIDER_SITE_OTHER): Payer: Medicare Other | Admitting: Internal Medicine

## 2020-07-23 ENCOUNTER — Other Ambulatory Visit: Payer: Self-pay

## 2020-07-23 ENCOUNTER — Encounter: Payer: Self-pay | Admitting: Internal Medicine

## 2020-07-23 VITALS — BP 156/88 | HR 90 | Temp 98.2°F | Resp 16 | Ht 66.0 in | Wt 119.0 lb

## 2020-07-23 DIAGNOSIS — I442 Atrioventricular block, complete: Secondary | ICD-10-CM

## 2020-07-23 DIAGNOSIS — E039 Hypothyroidism, unspecified: Secondary | ICD-10-CM | POA: Diagnosis not present

## 2020-07-23 DIAGNOSIS — I10 Essential (primary) hypertension: Secondary | ICD-10-CM | POA: Diagnosis not present

## 2020-07-23 NOTE — Progress Notes (Signed)
Subjective:  Patient ID: Alyssa [name], female    DOB: August 30, 1924  Age: 87 y.o. MRN: [id number]  CC: Hypertension  This visit occurred during the SARS-CoV-2 public health emergency.  Safety protocols were in place, including screening questions prior to the visit, additional usage of staff PPE, and extensive cleaning of exam room while observing appropriate contact time as indicated for disinfecting solutions.    HPI Alyssa [name] presents for f/up -   She is concerned about her blood pressure.  Over the last few days she has had a couple of systolic blood pressures that were in the 170-180 range.  She is asymptomatic with this.  She denies headache, blurred vision, chest pain, shortness of breath, dizziness, or lightheadedness.  She thinks her blood pressure is elevated because her son recently died from liver cancer and this has caused her stress.  Outpatient Medications Prior to Visit  Medication Sig Dispense Refill   acetaminophen (TYLENOL) 650 MG CR tablet Take 1,300 mg by mouth 2 (two) times daily.     apixaban (ELIQUIS) 2.5 MG TABS tablet Take 1 tablet (2.5 mg total) by mouth 2 (two) times daily. 180 tablet 1   bifidobacterium infantis (ALIGN) capsule Take 2 capsules by mouth daily.     clonazePAM (KLONOPIN) 0.5 MG tablet Take 1 tablet (0.5 mg total) by mouth 2 (two) times daily as needed. for anxiety 180 tablet 1   Eluxadoline (VIBERZI) 75 MG TABS Take 1 tablet by mouth daily. (Patient taking differently: Take 1 tablet by mouth daily as needed (ibs).) 56 tablet 0   furosemide (LASIX) 40 MG tablet Take 1 tablet (40 mg total) by mouth daily. Please make yearly appt with Dr. Caryl Comes for July 2022 for future refills. Thank you 1st attempt 90 tablet 0   levothyroxine (EUTHYROX) 50 MCG tablet Take 1 tablet (50 mcg total) by mouth daily before breakfast. 90 tablet 1   MISC NATURAL PRODUCTS EX Apply 1 application topically in the morning and at bedtime. CBD cream for arthritis pain     potassium  chloride SA (KLOR-CON) 20 MEQ tablet Take 1 tablet (20 mEq total) by mouth daily. 90 tablet 1   No facility-administered medications prior to visit.    ROS Review of Systems  Constitutional:  Negative for diaphoresis and fatigue.  HENT: Negative.    Eyes: Negative.  Negative for visual disturbance.  Respiratory:  Negative for chest tightness, shortness of breath and wheezing.   Cardiovascular:  Negative for chest pain, palpitations and leg swelling.  Gastrointestinal:  Negative for abdominal pain, constipation, diarrhea, nausea and vomiting.  Endocrine: Negative.   Genitourinary: Negative.  Negative for hematuria.  Musculoskeletal: Negative.   Skin: Negative.  Negative for color change.  Neurological:  Negative for dizziness, weakness, light-headedness and headaches.  Hematological:  Negative for adenopathy. Does not bruise/bleed easily.  Psychiatric/Behavioral: Negative.     Objective:  BP (!) 156/88 (BP Location: Left Arm, Patient Position: Sitting, Cuff Size: Normal)   Pulse 90   Temp 98.2 F (36.8 C) (Oral)   Resp 16   Ht 5\' 6"  (1.676 m)   Wt 119 lb (54 kg)   SpO2 93%   BMI 19.21 kg/m   BP Readings from Last 3 Encounters:  07/23/20 (!) 156/88  05/22/20 124/80  04/07/20 (!) 144/67    Wt Readings from Last 3 Encounters:  07/23/20 119 lb (54 kg)  05/22/20 121 lb (54.9 kg)  03/13/20 124 lb (56.2 kg)    Physical Exam  Vitals reviewed.  Constitutional:      Appearance: Normal appearance.  HENT:     Mouth/Throat:     Mouth: Mucous membranes are moist.  Eyes:     General: No scleral icterus.    Conjunctiva/sclera: Conjunctivae normal.  Cardiovascular:     Rate and Rhythm: Normal rate and regular rhythm.     Heart sounds: Normal heart sounds, S1 normal and S2 normal. No murmur heard.   No gallop.     Comments: EKG- V-paced rhythm, 64 bpm Pulmonary:     Effort: Pulmonary effort is normal.     Breath sounds: No stridor. No wheezing, rhonchi or rales.   Abdominal:     General: Abdomen is flat.     Palpations: There is no mass.     Tenderness: There is no abdominal tenderness. There is no guarding.     Hernia: No hernia is present.  Musculoskeletal:        General: Normal range of motion.     Cervical back: Neck supple.     Right lower leg: No edema.     Left lower leg: No edema.  Lymphadenopathy:     Cervical: No cervical adenopathy.  Skin:    General: Skin is warm.     Findings: No lesion.  Neurological:     General: No focal deficit present.     Mental Status: She is alert.    Lab Results  Component Value Date   WBC 3.6 (L) 05/22/2020   HGB 13.0 05/22/2020   HCT 39.5 05/22/2020   PLT 157.0 05/22/2020   GLUCOSE 86 05/22/2020   CHOL 155 02/11/2014   TRIG 84.0 02/11/2014   HDL 46.10 02/11/2014   LDLCALC 92 02/11/2014   ALT 17 03/14/2020   AST 20 03/14/2020   NA 142 05/22/2020   K 4.1 05/22/2020   CL 103 05/22/2020   CREATININE 1.21 (H) 05/22/2020   BUN 28 (H) 05/22/2020   CO2 31 05/22/2020   TSH 5.13 (H) 05/22/2020   INR 1.3 (A) 03/17/2020   HGBA1C 5.7 06/12/2019    No results found.  Assessment & Plan:   Alyssa [name] was seen today for hypertension.  Diagnoses and all orders for this visit:  Hypertension, unspecified type- Based on the reading here today her blood pressure is adequately well controlled.  She is not willing to take another antihypertensive.  If she changes her mind she will let me know and I will recommend a beta-blocker like nebivolol. -     EKG 12-Lead  Complete heart block (HCC)  Essential hypertension, benign  Acquired hypothyroidism- Her recent TSH was in the acceptable range.  She will stay on the current dose of levothyroxine.  I am having Alyssa [name] maintain her acetaminophen, Viberzi, MISC NATURAL PRODUCTS EX, bifidobacterium infantis, apixaban, potassium chloride SA, furosemide, levothyroxine, and clonazePAM.  No orders of the defined types were placed in this  encounter.    Follow-up: No follow-ups on file.  Alyssa Calico, MD

## 2020-07-23 NOTE — Telephone Encounter (Signed)
---  Caller states that she has elevated blood pressure. Her BP was 184/92, but after she exercised, it went down. It's around 173/92 right now. No other symptoms.  Advised to see PCP within 3 days- lvm for pt

## 2020-07-24 ENCOUNTER — Encounter: Payer: Self-pay | Admitting: Internal Medicine

## 2020-07-24 NOTE — Patient Instructions (Signed)

## 2020-07-25 ENCOUNTER — Other Ambulatory Visit: Payer: Self-pay | Admitting: Internal Medicine

## 2020-07-25 ENCOUNTER — Telehealth: Payer: Self-pay | Admitting: Internal Medicine

## 2020-07-25 DIAGNOSIS — I10 Essential (primary) hypertension: Secondary | ICD-10-CM

## 2020-07-25 MED ORDER — NEBIVOLOL HCL 2.5 MG PO TABS: 2.5000 mg | ORAL_TABLET | Freq: Every day | ORAL | 0 refills | Status: DC

## 2020-07-25 NOTE — Telephone Encounter (Signed)
Pt stated that she changed her mind and would like to start a hypertension medication. Please advise.

## 2020-07-25 NOTE — Telephone Encounter (Signed)
Patient called and said that she would like a call back to discuss medication for high blood pressure. Please advise

## 2020-07-25 NOTE — Telephone Encounter (Signed)
Pt has been informed.

## 2020-08-04 ENCOUNTER — Other Ambulatory Visit: Payer: Self-pay | Admitting: Internal Medicine

## 2020-08-06 ENCOUNTER — Ambulatory Visit: Payer: Medicare Other

## 2020-08-21 ENCOUNTER — Telehealth: Payer: Self-pay | Admitting: Internal Medicine

## 2020-08-21 NOTE — Telephone Encounter (Signed)
  BJ:SEGBTDVVO (BYSTOLIC) 2.5 MG tablet  Patient calling to report  medication  causes her to feel SOB during the night. She states she takes medication around [place] to feel short of breath in the early hours of the morning  Advised patient also to reach out to her Cardiologist   Please call

## 2020-08-22 ENCOUNTER — Telehealth: Payer: Self-pay | Admitting: Internal Medicine

## 2020-08-22 NOTE — Telephone Encounter (Addendum)
Spoke to the patient about Dr. [name] prescribing the medication. (See on going note at that office) However, we will make sure it doesn't sound like an excess of fluid problem. Per the patient she has no swelling, has not missed any doses of furosemide, has no changes in diet or salt intake, and no change in daily weights. No chest pain. The patient does not seem to have extra fluid therefore will defer to Dr. [name]. Patient aware and in agreement.

## 2020-08-22 NOTE — Telephone Encounter (Signed)
Pt c/o medication issue:  1. Name of Medication: Nebivolol  2. How are you currently taking this medication (dosage and times per day)? 1 time a day  3. Are you having a reaction (difficulty breathing--STAT)? Short of breath- but not at this time- it wakes her up night  4. What is your medication issue? This medicine is causing her to be short of breath

## 2020-08-26 ENCOUNTER — Telehealth: Payer: Self-pay

## 2020-08-26 DIAGNOSIS — H02055 Trichiasis without entropian left lower eyelid: Secondary | ICD-10-CM | POA: Diagnosis not present

## 2020-08-26 NOTE — Telephone Encounter (Signed)
Pt has stated that after taking new medication nebivolol (BYSTOLIC) 2.5 MG tablet   Patient calling to report  medication  causes her to feel SOB during the night. Pt has spoken to her Cardiologist about this as well and was informed to contact PCP to f/u on medication and has been awaiting a response x1 week about the matter.  **Sent to pcp/ma to advise.

## 2020-08-27 NOTE — Telephone Encounter (Signed)
   Patient made aware of Dr [name] recommendation

## 2020-08-27 NOTE — Telephone Encounter (Signed)
LVM for pt of Dr. [name] instructions.  " If the med is causing a side effect then she should stop taking it   TJ

## 2020-08-29 ENCOUNTER — Other Ambulatory Visit: Payer: Self-pay | Admitting: Internal Medicine

## 2020-08-29 NOTE — Telephone Encounter (Addendum)
Pt last saw Tommye Standard, [place] on 08/15/19, pt is due for follow-up.  Last labs 05/22/20 Creat 1.21, age 87, weight 54kg, based on specified criteria pt is on appropriate dosage of Eliquis 2.'5mg'$  BID.  Will refill rx with note must see MD for future refills. Msg sent to schedulers to contact pt to schedule follow-up appt.

## 2020-09-02 ENCOUNTER — Other Ambulatory Visit: Payer: Self-pay

## 2020-09-02 ENCOUNTER — Ambulatory Visit (INDEPENDENT_AMBULATORY_CARE_PROVIDER_SITE_OTHER): Payer: Medicare Other | Admitting: Pharmacist

## 2020-09-02 DIAGNOSIS — N1832 Chronic kidney disease, stage 3b: Secondary | ICD-10-CM

## 2020-09-02 DIAGNOSIS — I10 Essential (primary) hypertension: Secondary | ICD-10-CM | POA: Diagnosis not present

## 2020-09-02 DIAGNOSIS — I5032 Chronic diastolic (congestive) heart failure: Secondary | ICD-10-CM | POA: Diagnosis not present

## 2020-09-02 DIAGNOSIS — E039 Hypothyroidism, unspecified: Secondary | ICD-10-CM | POA: Diagnosis not present

## 2020-09-02 DIAGNOSIS — I4821 Permanent atrial fibrillation: Secondary | ICD-10-CM

## 2020-09-02 DIAGNOSIS — F411 Generalized anxiety disorder: Secondary | ICD-10-CM

## 2020-09-02 MED ORDER — POTASSIUM CHLORIDE CRYS ER 10 MEQ PO TBCR: 10.0000 meq | EXTENDED_RELEASE_TABLET | Freq: Every day | ORAL | 0 refills | Status: DC

## 2020-09-02 NOTE — Progress Notes (Signed)
Chronic Care Management Pharmacy Note  09/04/2020 Name:  Alyssa [name] MRN:  [id number] DOB:  [birth year]  Summary: -Pt reports BP has returned to normal (SBP 130-140) without nebivolol (stopped due to shortness of breath) -Pt has been cutting potassium in 1/2 due to size of pill -Pt endorses compliance with medications and denies issues  Recommendations/Changes made from today's visit: -Removed nebivolol from med list -Sent rx for Potassium Cl 10 mEq for smaller pill size -Advised pt to get COVID booster    Subjective: Alyssa [name] is an 87 y.o. year old female who is a primary patient of Alyssa Lima, MD.  The CCM team was consulted for assistance with disease management and care coordination needs.    Engaged with patient by telephone for follow up visit in response to provider referral for pharmacy case management and/or care coordination services.   Consent to Services:  The patient was given information about Chronic Care Management services, agreed to services, and gave verbal consent prior to initiation of services.  Please see initial visit note for detailed documentation.   Patient Care Team: Alyssa Lima, MD as PCP - General Alyssa Sprang, MD as PCP - Cardiology (Cardiology) Alyssa Sprang, MD (Cardiology) Alyssa [name], [facility] as Pharmacist (Pharmacist)  Recent office visits: 08/27/20 pt advised to stop taking Bystolic as she is having shortness of breath.  07/23/20 Dr [name] OV: f/u BP. SBP 170-180. Suffered death of son recently. BP 156/88 in office. Pt initially not willing to take another BP med but changed her mind. Started nebivolol 2.5 mg daily.  05/22/20 [name] (PCP) - Hypertension, anemia and hypothyroidism. F/u 4 months.   03/18/20 [name] (PCP) - Anemia. 03/13/20 [name] (PCP) - Epistaxis. Sent to hospital by EMS. 03/05/20 [name] (PCP) - Cellulitis of lower extremity. Start Cefdinir 300 mg & Bactrim 800-126m. F/u 1 week  [facility]   Objective:  Lab Results  Component Value Date   CREATININE 1.21 (H) 05/22/2020   BUN 28 (H) 05/22/2020   GFR 38.49 (L) 05/22/2020   GFRNONAA 28 (L) 03/14/2020   GFRAA 41 (L) 08/15/2019   NA 142 05/22/2020   K 4.1 05/22/2020   CALCIUM 9.0 05/22/2020   CO2 31 05/22/2020   GLUCOSE 86 05/22/2020    Lab Results  Component Value Date/Time   HGBA1C 5.7 06/12/2019 03:15 PM   HGBA1C 6.0 02/20/2018 01:39 PM   GFR 38.49 (L) 05/22/2020 02:15 PM   GFR 35.72 (L) 01/28/2020 03:26 PM    Last diabetic Eye exam: No results found for: HMDIABEYEEXA  Last diabetic Foot exam: No results found for: HMDIABFOOTEX   Lab Results  Component Value Date   CHOL 155 02/11/2014   HDL 46.10 02/11/2014   LDLCALC 92 02/11/2014   TRIG 84.0 02/11/2014   CHOLHDL 3 02/11/2014    Hepatic Function Latest Ref Rng & Units 03/14/2020 03/13/2020 11/13/2019  Total Protein 6.5 - 8.1 g/dL 5.2(L) 6.4(L) 7.2  Albumin 3.5 - 5.0 g/dL 3.3(L) 3.8 4.8  AST 15 - 41 U/L _0 ALT 0 - 44 U/L _1 Alk Phosphatase 38 - 126 U/L 39 51 50  Total Bilirubin 0.3 - 1.2 mg/dL 0.8 0.6 0.6  Bilirubin, Direct 0.0 - 0.3 mg/dL - - 0.1    Lab Results  Component Value Date/Time   TSH 5.13 (H) 05/22/2020 02:15 PM   TSH 8.08 (H) 01/14/2020 10:02 AM  FREET4 1.47 02/21/2018 02:32 AM    CBC Latest Ref Rng & Units 05/22/2020 03/18/2020 03/14/2020  WBC 4.0 - 10.5 K/uL 3.6(L) 5.0 -  Hemoglobin 12.0 - 15.0 g/dL 13.0 10.0(L) 9.6(L)  Hematocrit 36.0 - 46.0 % 39.5 29.9(L) 29.0(L)  Platelets 150.0 - 400.0 K/uL 157.0 200.0 -    Lab Results  Component Value Date/Time   VD25OH 33.15 10/27/2017 11:35 AM    Clinical ASCVD: No  The ASCVD Risk score Alyssa Bussing DC Jr., et al., 2013) failed to calculate for the following reasons:   The 2013 ASCVD risk score is only valid for ages 20 to 22    Depression screen PHQ 2/9 06/12/2019 03/07/2018 09/16/2016  Decreased Interest 0 0 0  Down,  Depressed, Hopeless 0 0 0  PHQ - 2 Score 0 0 0  Some recent data might be hidden   CHA2DS2-VASc Score = 5  The patient's score is based upon: CHF History: Yes HTN History: Yes Diabetes History: No Stroke History: No Vascular Disease History: No      Social History   Tobacco Use  Smoking Status Former   Types: Cigarettes   Quit date: 02/08/1954   Years since quitting: 66.6  Smokeless Tobacco Never   BP Readings from Last 3 Encounters:  07/23/20 (!) 156/88  05/22/20 124/80  04/07/20 (!) 144/67   Pulse Readings from Last 3 Encounters:  07/23/20 90  05/22/20 90  04/07/20 62   Wt Readings from Last 3 Encounters:  07/23/20 119 lb (54 kg)  05/22/20 121 lb (54.9 kg)  03/13/20 124 lb (56.2 kg)   BMI Readings from Last 3 Encounters:  07/23/20 19.21 kg/m  05/22/20 19.53 kg/m  03/18/20 20.01 kg/m    Assessment/Interventions: Review of patient past medical history, allergies, medications, health status, including review of consultants reports, laboratory and other test data, was performed as part of comprehensive evaluation and provision of chronic care management services.   SDOH:  (Social Determinants of Health) assessments and interventions performed: Yes  SDOH Screenings   Alcohol Screen: Not on file  Depression (PHQ2-9): Not on file  Financial Resource Strain: Low Risk    Difficulty of Paying Living Expenses: Not hard at all  Food Insecurity: Not on file  Housing: Not on file  Physical Activity: Not on file  Social Connections: Not on file  Stress: Not on file  Tobacco Use: Medium Risk   Smoking Tobacco Use: Former   Smokeless Tobacco Use: Never  Transportation Needs: Not on file    [place]  No Known Allergies  Medications Reviewed Today     Reviewed by Alyssa [name], [place] (Pharmacist) on 09/02/20 at [facility] List Status: <None>   Medication Order Taking? Sig Documenting Provider Last Dose Status Informant  acetaminophen (TYLENOL) 650 MG  CR tablet [id number] Yes Take 1,300 mg by mouth 2 (two) times daily. [provider] Taking Active Self           Med Note Harrel Lemon, Lynelle Smoke T   Thu May 08, 2015  2:56 PM)    apixaban (ELIQUIS) 2.5 MG TABS tablet [id number] Yes Take 1 tablet (2.5 mg total) by mouth 2 (two) times daily. Pt is overdue for follow-up, MUST see MD for FUTURE refills. Alyssa Sprang, MD Taking Active   bifidobacterium infantis [facility]) capsule [id number] Yes Take 2 capsules by mouth daily. [provider] Taking Active Self  clonazePAM (KLONOPIN) 0.5 MG tablet [id number] Yes Take 1 tablet (0.5 mg total) by mouth 2 (  two) times daily as needed. for anxiety Alyssa Lima, MD Taking Active   furosemide (LASIX) 40 MG tablet [id number] Yes Take 1 tablet (40 mg total) by mouth daily. Please schedule yearly appointment with Dr. Caryl Comes for future refills. 2nd attempt. Thank you Alyssa Sprang, MD Taking Active   levothyroxine (EUTHYROX) 50 MCG tablet [id number] Yes Take 1 tablet (50 mcg total) by mouth daily before breakfast. Alyssa Lima, MD Taking Active   MISC NATURAL PRODUCTS EX [id number] Yes Apply 1 application topically in the morning and at bedtime. CBD cream for arthritis pain [provider] Taking Active Self  potassium chloride SA (KLOR-CON) 10 MEQ tablet [id number] Yes Take 1 tablet (10 mEq total) by mouth daily. Alyssa Lima, MD Taking Active             Patient Active Problem List   Diagnosis Date Noted   Iron deficiency anemia due to chronic blood loss 03/19/2020   Deficiency anemia 03/18/2020   Hypoproteinemia ([place]) 03/13/2020   Chronic right-sided low back pain without sciatica 05/09/2019   Fracture of vertebra due to osteoporosis ([place]) 12/28/2018   IBS (irritable bowel syndrome) 02/20/2018   Seasonal allergic rhinitis due to pollen 11/25/2016   (HFpEF) heart failure with preserved ejection fraction ([place]) 08/13/2016   GAD (generalized anxiety disorder) 02/18/2016   Essential  hypertension, benign 09/04/2015   Severe tricuspid regurgitation 04/22/2015   Complete heart block ([place]) 02/26/2015   Chronic renal insufficiency, stage III (moderate) ([place]) 06/12/2012   Pacemaker-dual chamber  06/23/2010   Irritable bowel syndrome 12/01/2009   GERD 06/10/2009   Hypothyroidism 02/26/2008   OSTEOPOROSIS 02/26/2008    Immunization History  Administered Date(s) Administered   Fluad Quad(high Dose 65+) 10/19/2018, 11/13/2019   Influenza Split 11/27/2010, 12/23/2011   Influenza Whole 12/13/2008, 12/01/2009   Influenza, High Dose Seasonal PF 12/12/2012, 11/05/2015, 11/25/2016, 10/27/2017   Influenza,inj,Quad PF,6+ Mos 11/29/2013, 11/18/2014   PFIZER(Purple Top)SARS-COV-2 Vaccination 02/27/2019, 03/20/2019, 11/27/2019   Pneumococcal Conjugate-13 02/11/2014   Pneumococcal Polysaccharide-23 04/23/2010, 06/12/2019   Td 04/23/2010   Tdap 02/12/2018   Zoster, Live 08/24/2012    Conditions to be addressed/monitored:  Hypertension, Atrial Fibrillation, Heart Failure, Chronic Kidney Disease, Hypothyroidism, and Anxiety  Care Plan : [place]  Updates made by Alyssa [name], RPH since 09/04/2020 12:00 AM     Problem: Hypertension, Atrial Fibrillation, Heart Failure, Chronic Kidney Disease, Hypothyroidism, and Anxiety   Priority: High     Long-Range Goal: Disease management   Start Date: 09/04/2020  Expected End Date: 09/04/2021  This Visit's Progress: On track  Priority: High  Note:   Current Barriers:  Unable to independently monitor therapeutic efficacy  Pharmacist Clinical Goal(s):  Patient will achieve adherence to monitoring guidelines and medication adherence to achieve therapeutic efficacy through collaboration with PharmD and provider.   Interventions: 1:1 collaboration with Alyssa Lima, MD regarding development and update of comprehensive plan of care as evidenced by provider attestation and co-signature Inter-disciplinary care team  collaboration (see longitudinal plan of care) Comprehensive medication review performed; medication list updated in electronic medical record   Heart Failure / Hypertension / CKD    Type: HFpEF Last ejection fraction: 50-55% (07/03/2019) SBP 131-142  Patient has failed these meds in past: nebivolol (SOB) Patient is currently controlled on the following medications: Furosemide 40 mg daily - AM  Potassium chloride 20 mEq - 1/2 tab daily   We discussed: pt reports she is no longer taking nebivolol, BP has returned to  normal; she believes it was elevated before due to stress of losing her son -pt endorses compliance with meds as above; of note she is taking 1/2 of prescribed Kcl dose for a long time, most recent K+ was WNL. Pt has trouble swallowing large 20 mEq pills, she would prefer smaller pill size   Plan: Change Potassium Cl 20 mEq to 10 mEq for smaller pill size   AFIB w/ severe triscupid regurgitation    Patient is currently rate controlled.  Patient has failed these meds in past: warfarin (epistaxis) Patient is currently controlled on the following medications: Eliquis 2.5 mg BID   We discussed: Pt was switched from warfarin to Eliquis earlier this year after epistaxis requiring ED admission; she reports doing well on Eliquis, denies further bleeding issues; it is somewhat expensive ($161 for 90 days) but manageable currently   Plan: Continue current medications   Hypothyroidism  Patient has failed these meds in past: n/a Patient is currently controlled on the following medications: Levothyroxine 50 mcg daily   We discussed: optimal dosing of levothyroxine on empty stomach separate from food/other meds. Pt reports she has been taking it with other meds, also reports she has been feeling more tired than usual since dose change.   Plan: Take levothyroxine separate from other meds   Anxiety     Patient has failed these meds in past: n/a Patient is currently controlled on  the following medications: Clonazepam 0.5 mg BID prn   We discussed: Pt was under significant stress and grief after loss of son in Spring 2022; she reports she has been doing better lately   Plan: Continue current medications  Patient Goals/Self-Care Activities Patient will:  - take medications as prescribed focus on medication adherence by routine check blood pressure 3x a week, document, and provide at future appointments -Advised to get COVID booster      Medication Assistance: None required.  Patient affirms current coverage meets needs.  Compliance/Adherence/Medication fill history: Care Gaps: Shingrix Covid booster (due 02/27/20)  Star-Rating Drugs: None  Patient's preferred pharmacy is:  [facility] [place], [place] - [address] AT [address] [place] [address] Phone: [phone] Fax: [phone]  Uses pill box? No - prefers bottles Pt endorses 100% compliance  We discussed: Current pharmacy is preferred with insurance plan and patient is satisfied with pharmacy services Patient decided to: Continue current medication management strategy  Care Plan and Follow Up Patient Decision:  Patient agrees to Care Plan and Follow-up.  Plan: Telephone follow up appointment with care management team member scheduled for:  6 months  Charlene Brooke, PharmD, [place], CPP Clinical Pharmacist [facility] at [facility] [phone]

## 2020-09-04 NOTE — Patient Instructions (Signed)
Visit Information  Phone number for Pharmacist: [phone]   Goals Addressed             This Visit's Progress    Manage My Medicine       Timeframe:  Long-Range Goal Priority:  Medium Start Date:       09/02/20                      Expected End Date:    09/02/21                   Follow Up Date Jan 2023   - call for medicine refill 2 or 3 days before it runs out - call if I am sick and can't take my medicine - keep a list of all the medicines I take; vitamins and herbals too - use a pillbox to sort medicine  -Get COVID booster   Why is this important?   These steps will help you keep on track with your medicines.   Notes:         Care Plan : CCM Pharmacy Care Plan  Updates made by [name], RPH since 09/04/2020 12:00 AM     Problem: Hypertension, Atrial Fibrillation, Heart Failure, Chronic Kidney Disease, Hypothyroidism, and Anxiety   Priority: High     Long-Range Goal: Disease management   Start Date: 09/04/2020  Expected End Date: 09/04/2021  This Visit's Progress: On track  Priority: High  Note:   Current Barriers:  Unable to independently monitor therapeutic efficacy  Pharmacist Clinical Goal(s):  Patient will achieve adherence to monitoring guidelines and medication adherence to achieve therapeutic efficacy through collaboration with PharmD and provider.   Interventions: 1:1 collaboration with [name], MD regarding development and update of comprehensive plan of care as evidenced by provider attestation and co-signature Inter-disciplinary care team collaboration (see longitudinal plan of care) Comprehensive medication review performed; medication list updated in electronic medical record   Heart Failure / Hypertension / CKD    Type: HFpEF Last ejection fraction: 50-55% (07/03/2019) SBP 131-142  Patient has failed these meds in past: nebivolol (SOB) Patient is currently controlled on the following medications: Furosemide 40 mg  daily - AM  Potassium chloride 20 mEq - 1/2 tab daily   We discussed: pt reports she is no longer taking nebivolol, BP has returned to normal; she believes it was elevated before due to stress of losing her son -pt endorses compliance with meds as above; of note she is taking 1/2 of prescribed Kcl dose for a long time, most recent K+ was WNL. Pt has trouble swallowing large 20 mEq pills, she would prefer smaller pill size   Plan: Change Potassium Cl 20 mEq to 10 mEq for smaller pill size   AFIB w/ severe triscupid regurgitation    Patient is currently rate controlled.  Patient has failed these meds in past: warfarin (epistaxis) Patient is currently controlled on the following medications: Eliquis 2.5 mg BID   We discussed: Pt was switched from warfarin to Eliquis earlier this year after epistaxis requiring ED admission; she reports doing well on Eliquis, denies further bleeding issues; it is somewhat expensive ($161 for 90 days) but manageable currently   Plan: Continue current medications   Hypothyroidism  Patient has failed these meds in past: n/a Patient is currently controlled on the following medications: Levothyroxine 50 mcg daily   We discussed: optimal dosing of levothyroxine on empty stomach separate from food/other meds. Pt reports  she has been taking it with other meds, also reports she has been feeling more tired than usual since dose change.   Plan: Take levothyroxine separate from other meds   Anxiety     Patient has failed these meds in past: n/a Patient is currently controlled on the following medications: Clonazepam 0.5 mg BID prn   We discussed: Pt was under significant stress and grief after loss of son in Spring 2022; she reports she has been doing better lately   Plan: Continue current medications  Patient Goals/Self-Care Activities Patient will:  - take medications as prescribed focus on medication adherence by routine check blood pressure 3x a week,  document, and provide at future appointments -Advised to get COVID booster      Patient verbalizes understanding of instructions provided today and agrees to view in [place].  Telephone follow up appointment with pharmacy team member scheduled for: 6 months  Charlene Brooke, PharmD, [place], CPP Clinical Pharmacist [facility] at [facility] [phone]

## 2020-09-06 ENCOUNTER — Other Ambulatory Visit: Payer: Self-pay | Admitting: Internal Medicine

## 2020-09-11 DIAGNOSIS — H5203 Hypermetropia, bilateral: Secondary | ICD-10-CM | POA: Diagnosis not present

## 2020-09-14 ENCOUNTER — Other Ambulatory Visit: Payer: Self-pay | Admitting: Internal Medicine

## 2020-09-15 ENCOUNTER — Telehealth: Payer: Self-pay | Admitting: Internal Medicine

## 2020-09-15 MED ORDER — FUROSEMIDE 40 MG PO TABS: 40.0000 mg | ORAL_TABLET | Freq: Every day | ORAL | 0 refills | Status: DC

## 2020-09-15 NOTE — Telephone Encounter (Signed)
Refill for Lasix 40 mg, #90 as pt has appointment with Dr. Caryl Comes in October, sent to [facility].

## 2020-09-15 NOTE — Telephone Encounter (Signed)
*  STAT* If patient is at the pharmacy, call can be transferred to refill team.   1. Which medications need to be refilled? (please list name of each medication and dose if known)  furosemide (LASIX) 40 MG tablet  2. Which pharmacy/location (including street and city if local pharmacy) is medication to be sent to?  [place], [place] - [address] AT [place]    3. Do they need a 30 day or 90 day supply?  90 day supply

## 2020-09-18 ENCOUNTER — Ambulatory Visit (INDEPENDENT_AMBULATORY_CARE_PROVIDER_SITE_OTHER): Payer: Medicare Other

## 2020-09-18 DIAGNOSIS — I442 Atrioventricular block, complete: Secondary | ICD-10-CM

## 2020-09-18 LAB — CUP PACEART REMOTE DEVICE CHECK
Battery Impedance: 882 Ohm
Battery Remaining Longevity: 62 mo
Battery Voltage: 2.78 V
Brady Statistic RV Percent Paced: 100 %
Date Time Interrogation Session: 20220811080908
Implantable Lead Implant Date: 20000921
Implantable Lead Implant Date: 20000921
Implantable Lead Location: 753859
Implantable Lead Location: 753860
Implantable Pulse Generator Implant Date: 20170118
Lead Channel Impedance Value: 575 Ohm
Lead Channel Impedance Value: 67 Ohm
Lead Channel Pacing Threshold Amplitude: 0.875 V
Lead Channel Pacing Threshold Pulse Width: 0.4 ms
Lead Channel Setting Pacing Amplitude: 2.5 V
Lead Channel Setting Pacing Pulse Width: 0.4 ms
Lead Channel Setting Sensing Sensitivity: 2 mV

## 2020-10-09 NOTE — Progress Notes (Signed)
Remote pacemaker transmission.   

## 2020-10-14 DIAGNOSIS — L603 Nail dystrophy: Secondary | ICD-10-CM | POA: Diagnosis not present

## 2020-10-14 DIAGNOSIS — L608 Other nail disorders: Secondary | ICD-10-CM | POA: Diagnosis not present

## 2020-10-14 DIAGNOSIS — Z85828 Personal history of other malignant neoplasm of skin: Secondary | ICD-10-CM | POA: Diagnosis not present

## 2020-10-22 ENCOUNTER — Telehealth: Payer: Self-pay | Admitting: Internal Medicine

## 2020-10-22 NOTE — Telephone Encounter (Signed)
Patient calling in about 2 new meds other provider prescribed her: Fluconazole (1 weekly for 4 months) & Hair, Skin, Nail vitamin extra strength 3x daily   Patient wants to make sure it is okay to take while still taking current meds  Wants nurse to call her back [phone]

## 2020-10-23 NOTE — Telephone Encounter (Signed)
Pt has been informed and expressed understanding.  

## 2020-10-27 ENCOUNTER — Telehealth: Payer: Self-pay | Admitting: Internal Medicine

## 2020-10-27 NOTE — Telephone Encounter (Signed)
Left a message for the pt to call back.   Echo 06/2019 EF 50-55% Next OV 12/02/20 with SK

## 2020-10-27 NOTE — Telephone Encounter (Signed)
..  Pt c/o Shortness Of Breath: STAT if SOB developed within the last 24 hours or pt is noticeably SOB on the phone  1. Are you currently SOB (can you hear that pt is SOB on the phone)? Not at this time, was just a few minutes ago, itis very uncomfortable   2. How long have you been experiencing SOB? A couple of months on and off- last night was short of breath every hour  3. Are you SOB when sitting or when up moving around?  When laying down, it wakes her up  4. Are you currently experiencing any other symptoms? no

## 2020-10-27 NOTE — Telephone Encounter (Addendum)
Pt called to report that she has been having dyspnea with rest for several months but she does well with exercise on Mondays and Fridays.. she says last night she had to keep getting up to urinate several times and she had increased SOB each time she had to get up so about 5 am she propped herself up with 2 extra pillows and she was able to rest easier.   She says this morning she had some Sob at rest but since she has been up and around she is doing much better. She has had some sneezing, nasal congestion, and dry cough as she always does this time of the year.   She denies chest pain, but has palpitations time to time but nothing that affects her day and no SOB associated with the palpitations.   She does not have any peripheral edema. She still takes her Lasix 40 mg daily... I advised her to continue until she is seen since she is having dyspnea... she has been hydrating well at home and does not feel she is too "dry" with dry mouth or skin.   Pt says she did start taking Fluconazole since last week for yeast infection on her finger nails.   Pt says she would like an appt later in the week. I made her an appt with [facility] PA for this Thursday but she will call sooner if her symptoms worsens.   She will call her PCP if her urination continues to be more frequent and develops any other symptoms to suggest UTI and to discuss her "allergy" symptoms that may be affecting her breathing.

## 2020-10-29 NOTE — Progress Notes (Signed)
Electrophysiology Office Note Date: 10/30/2020  ID:  [name], [name] [birth year], MRN [id number]  PCP: [name], MD Primary Cardiologist: Virl Axe, MD Electrophysiologist: Virl Axe, MD   CC: Pacemaker follow-up  Alyssa [name] is a 87 y.o. female seen today for Virl Axe, MD for acute visit due to SOB .  Since last being seen in our clinic the patient reports doing more SOB, mostly in the evenings.  She is able to exercise twice weekly without difficulty or SOB. Starting in the summer she has been having several glasses of gatorade daily. She has multiple glasses of water, tea, and juice on occasion as well. On some nights she wakes up multiple times between 0100 and 0500 with SOB. She feels like her SOB starts to increase slightly after about 3 pm, after her "lasix has worn off".  No chest pain, lightheadedness, or syncope.   Device History: Medtronic Dual Chamber PPM implanted 2017 for CHB  Past Medical History:  Diagnosis Date   Atrial fibrillation ([place])    pacemaker, chronic anticoag   Breast cancer ([place])    Diverticulosis    Fibroma    inner lips/mouth   GERD (gastroesophageal reflux disease)    Hx of breast cancer    Hyperkalemia    Hypertension    Hypothyroidism    IBS (irritable bowel syndrome)    Internal hemorrhoids    Left inguinal hernia    direct   MVP (mitral valve prolapse)    Osteoporosis    Renal insufficiency    Thyroid cancer [facility])    Past Surgical History:  Procedure Laterality Date   COLONOSCOPY  10/28/2005   internal hemorrhoids, diverticulosis (same as in 2002 and random bxs negative then)   EP IMPLANTABLE DEVICE N/A 02/26/2015   Procedure: PPM Generator Changeout;  Surgeon: [name], MD;  Location: [place] [facility];  Service: Cardiovascular;  Laterality: N/A;   MASTECTOMY  1982   Bilateral   PACEMAKER INSERTION     THYROIDECTOMY     TONSILLECTOMY     UPPER GASTROINTESTINAL ENDOSCOPY  09/06/2000   normal    Current  Outpatient Medications  Medication Sig Dispense Refill   acetaminophen (TYLENOL) 650 MG CR tablet Take 1,300 mg by mouth 2 (two) times daily.     apixaban (ELIQUIS) 2.5 MG TABS tablet Take 1 tablet (2.5 mg total) by mouth 2 (two) times daily. Pt is overdue for follow-up, MUST see MD for FUTURE refills. 180 tablet 0   bifidobacterium infantis (ALIGN) capsule Take 2 capsules by mouth daily.     clonazePAM (KLONOPIN) 0.5 MG tablet Take 1 tablet (0.5 mg total) by mouth 2 (two) times daily as needed. for anxiety 180 tablet 1   fluconazole (DIFLUCAN) 200 MG tablet Take 200 mg by mouth once a week. For four months only     furosemide (LASIX) 40 MG tablet Take 1 tablet (40 mg total) by mouth daily. Please keep appointment with Dr. Caryl Comes in October future refills.. Thank you 90 tablet 0   levothyroxine (EUTHYROX) 50 MCG tablet Take 1 tablet (50 mcg total) by mouth daily before breakfast. 90 tablet 1   MISC NATURAL PRODUCTS EX Apply 1 application topically in the morning and at bedtime. CBD cream for arthritis pain     potassium chloride SA (KLOR-CON) 10 MEQ tablet Take 1 tablet (10 mEq total) by mouth daily. 90 tablet 0   No current facility-administered medications for this visit.    Allergies:  Patient has no known allergies.   Social History: Social History   Socioeconomic History   Marital status: Widowed    Spouse name: Not on file   Number of children: 3   Years of education: Not on file   Highest education level: Not on file  Occupational History   Occupation: Retired   Tobacco Use   Smoking status: Former    Types: Cigarettes    Quit date: 02/08/1954    Years since quitting: 66.7   Smokeless tobacco: Never  Vaping Use   Vaping Use: Never used  Substance and Sexual Activity   Alcohol use: No    Alcohol/week: 0.0 standard drinks   Drug use: No   Sexual activity: Not Currently  Other Topics Concern   Not on file  Social History Narrative   Regular Exercise: Yes   Daily  Caffeine Use: Sometimes.            Social Determinants of Health   Financial Resource Strain: Not on file  Food Insecurity: Not on file  Transportation Needs: Not on file  Physical Activity: Not on file  Stress: Not on file  Social Connections: Not on file  Intimate Partner Violence: Not on file    Family History: Family History  Problem Relation Age of Onset   Colon cancer Father 23   Heart disease Father    Stroke Mother    Arthritis Other    Hypertension Other    Esophageal cancer Neg Hx    Pancreatic cancer Neg Hx    Kidney disease Neg Hx    Liver disease Neg Hx    Diabetes Neg Hx    Rectal cancer Neg Hx    Stomach cancer Neg Hx      Review of Systems: All other systems reviewed and are otherwise negative except as noted above.  Physical Exam: Vitals:   10/30/20 1138  BP: 136/70  Pulse: 70  Weight: 122 lb (55.3 kg)  Height: 5' 6.75" (1.695 m)     GEN- The patient is well appearing, alert and oriented x 3 today.   HEENT: normocephalic, atraumatic; sclera clear, conjunctiva pink; hearing intact; oropharynx clear; neck supple  Lungs- Clear to ausculation bilaterally, normal work of breathing.  No wheezes, rales, rhonchi Heart- Regular rate and rhythm, no murmurs, rubs or gallops  GI- soft, non-tender, non-distended, bowel sounds present  Extremities- no clubbing or cyanosis. No edema MS- no significant deformity or atrophy Skin- warm and dry, no rash or lesion; PPM pocket well healed Psych- euthymic mood, full affect Neuro- strength and sensation are intact  PPM Interrogation- reviewed in detail today,  See PACEART report  EKG:  EKG is ordered today. Personal review shows V pacing at 70 bpm.  Recent Labs: 03/14/2020: ALT 17 05/22/2020: BUN 28; Creatinine, Ser 1.21; Hemoglobin 13.0; Platelets 157.0; Potassium 4.1; Sodium 142; TSH 5.13   Wt Readings from Last 3 Encounters:  10/30/20 122 lb (55.3 kg)  07/23/20 119 lb (54 kg)  05/22/20 121 lb (54.9 kg)      Other studies Reviewed: Additional studies/ records that were reviewed today include: Previous EP office notes, Previous remote checks, Most recent labwork.   Assessment and Plan:  1. CHB s/p Medtronic PPM  Normal PPM function See Pace Art report No changes today  2. Permanent Afib Continue coumadin for CHA2DS2-VASc of at least 3  3. Acute on chronic diastolic CHF Echo 03/5051 LVEF 50-55%, trace AI, mild MR, severe BAE, mod/severe TR, mild RVE ->  Stable from previous NYHA II-III symptoms.  Volume status mildly elevated but not significantly so.  She has been hydrating significantly over the summer and has continued to do so, she appears to be drinking well over 2 L daily.  Her symptoms are most consistent with orthopnea and PND. We reviewed fluid and sodium restriction at length.  Take lasix 60 mg daily x 2 days. Then continue lasix 40 mg daily with extra 1/2 tablet as needed for weight gain 3 lbs overnight or 5 lbs within one week. Labs today.  There is a slight suspicion that her symptoms have worsened since starting on diflucan. Encouraged PCP follow up in this regard if symptoms not improved on lasix. She is only taking diflucan once weekly.  Current medicines are reviewed at length with the patient today.    Labs/ tests ordered today include:  Orders Placed This Encounter  Procedures   Basic metabolic panel   CBC   Pro b natriuretic peptide (BNP)    Disposition:   Follow up with Dr. Caryl Comes in 6 Months    Signed, Annamaria Helling  10/30/2020 11:57 AM  [facility] [address] [place] [address] [phone] (office) [phone] (fax)

## 2020-10-30 ENCOUNTER — Encounter: Payer: Self-pay | Admitting: Student

## 2020-10-30 ENCOUNTER — Ambulatory Visit (INDEPENDENT_AMBULATORY_CARE_PROVIDER_SITE_OTHER): Payer: Medicare Other | Admitting: Student

## 2020-10-30 ENCOUNTER — Other Ambulatory Visit: Payer: Self-pay

## 2020-10-30 VITALS — BP 136/70 | HR 70 | Ht 66.75 in | Wt 122.0 lb

## 2020-10-30 DIAGNOSIS — I5032 Chronic diastolic (congestive) heart failure: Secondary | ICD-10-CM

## 2020-10-30 DIAGNOSIS — R079 Chest pain, unspecified: Secondary | ICD-10-CM

## 2020-10-30 DIAGNOSIS — I4821 Permanent atrial fibrillation: Secondary | ICD-10-CM

## 2020-10-30 DIAGNOSIS — I442 Atrioventricular block, complete: Secondary | ICD-10-CM | POA: Diagnosis not present

## 2020-10-30 LAB — CUP PACEART INCLINIC DEVICE CHECK
Battery Impedance: 961 Ohm
Battery Remaining Longevity: 59 mo
Battery Voltage: 2.78 V
Brady Statistic RV Percent Paced: 100 %
Date Time Interrogation Session: 20220922125439
Implantable Lead Implant Date: 20000921
Implantable Lead Implant Date: 20000921
Implantable Lead Location: 753859
Implantable Lead Location: 753860
Implantable Pulse Generator Implant Date: 20170118
Lead Channel Impedance Value: 585 Ohm
Lead Channel Impedance Value: 67 Ohm
Lead Channel Pacing Threshold Amplitude: 0.75 V
Lead Channel Pacing Threshold Amplitude: 1.25 V
Lead Channel Pacing Threshold Pulse Width: 0.4 ms
Lead Channel Pacing Threshold Pulse Width: 0.4 ms
Lead Channel Setting Pacing Amplitude: 2.5 V
Lead Channel Setting Pacing Pulse Width: 0.4 ms
Lead Channel Setting Sensing Sensitivity: 2 mV

## 2020-10-30 MED ORDER — FUROSEMIDE 40 MG PO TABS: 40.0000 mg | ORAL_TABLET | Freq: Every day | ORAL | 0 refills | Status: DC

## 2020-10-30 NOTE — Patient Instructions (Signed)
Medication Instructions:  Your physician has recommended you make the following change in your medication:   1) Furosemide 40mg  daily .Marland Kitchen..you may take an additional 1/2 tablet as needed for weight gain of 3 lbs overnight or 5 lbs in 1 week.   *If you need a refill on your cardiac medications before your next appointment, please call your pharmacy*   Lab Work: TODAY: BMET, CBC, BNP  If you have labs (blood work) drawn today and your tests are completely normal, you will receive your results only by: [place] (if you have MyChart) OR A paper copy in the mail If you have any lab test that is abnormal or we need to change your treatment, we will call you to review the results.   Follow-Up: At [facility], you and your health needs are our priority.  As part of our continuing mission to provide you with exceptional heart care, we have created designated Provider Care Teams.  These Care Teams include your primary Cardiologist (physician) and Advanced Practice Providers (APPs -  Physician Assistants and Nurse Practitioners) who all work together to provide you with the care you need, when you need it.   Your next appointment:   6 month(s)  The format for your next appointment:   In Person  Provider:   You may see Virl Axe, MD or one of the following Advanced Practice Providers on your designated Care Team:   Tommye Standard, [place] Legrand Como "Jonni Sanger" Chalmers Cater, [place]   Other Instructions Limit your fluid intake to no more than 6-8 standard glasses of fluid daily.

## 2020-10-31 ENCOUNTER — Encounter: Payer: Self-pay | Admitting: Internal Medicine

## 2020-10-31 ENCOUNTER — Ambulatory Visit (INDEPENDENT_AMBULATORY_CARE_PROVIDER_SITE_OTHER): Payer: Medicare Other | Admitting: Internal Medicine

## 2020-10-31 VITALS — BP 126/82 | HR 82 | Temp 98.2°F | Ht 66.75 in | Wt 121.0 lb

## 2020-10-31 DIAGNOSIS — R39198 Other difficulties with micturition: Secondary | ICD-10-CM

## 2020-10-31 DIAGNOSIS — N3001 Acute cystitis with hematuria: Secondary | ICD-10-CM

## 2020-10-31 LAB — BASIC METABOLIC PANEL
BUN/Creatinine Ratio: 25 (ref 12–28)
BUN: 31 mg/dL (ref 10–36)
CO2: 26 mmol/L (ref 20–29)
Calcium: 9.4 mg/dL (ref 8.7–10.3)
Chloride: 102 mmol/L (ref 96–106)
Creatinine, Ser: 1.22 mg/dL — ABNORMAL HIGH (ref 0.57–1.00)
Glucose: 87 mg/dL (ref 65–99)
Potassium: 5 mmol/L (ref 3.5–5.2)
Sodium: 143 mmol/L (ref 134–144)
eGFR: 41 mL/min/{1.73_m2} — ABNORMAL LOW (ref >59)

## 2020-10-31 LAB — POC URINALSYSI DIPSTICK (AUTOMATED)
Bilirubin, UA: NEGATIVE
Glucose, UA: NEGATIVE
Ketones, UA: NEGATIVE
Nitrite, UA: NEGATIVE
Protein, UA: POSITIVE — AB
Spec Grav, UA: 1.02 (ref 1.010–1.025)
Urobilinogen, UA: 0.2 E.U./dL
pH, UA: 5 (ref 5.0–8.0)

## 2020-10-31 LAB — CBC
Hematocrit: 39.5 % (ref 34.0–46.6)
Hemoglobin: 13.4 g/dL (ref 11.1–15.9)
MCH: 31.6 pg (ref 26.6–33.0)
MCHC: 33.9 g/dL (ref 31.5–35.7)
MCV: 93 fL (ref 79–97)
Platelets: 179 10*3/uL (ref 150–450)
RBC: 4.24 x10E6/uL (ref 3.77–5.28)
RDW: 11.8 % (ref 11.7–15.4)
WBC: 4.1 10*3/uL (ref 3.4–10.8)

## 2020-10-31 LAB — PRO B NATRIURETIC PEPTIDE: NT-Pro BNP: 2138 pg/mL — ABNORMAL HIGH (ref 0–738)

## 2020-10-31 MED ORDER — SULFAMETHOXAZOLE-TRIMETHOPRIM 800-160 MG PO TABS: 1.0000 | ORAL_TABLET | Freq: Two times a day (BID) | ORAL | 0 refills | Status: DC

## 2020-10-31 MED ORDER — SULFAMETHOXAZOLE-TRIMETHOPRIM 400-80 MG PO TABS: 1.0000 | ORAL_TABLET | Freq: Two times a day (BID) | ORAL | 0 refills | Status: AC

## 2020-10-31 NOTE — Progress Notes (Signed)
Subjective:    Patient ID: Alyssa [name], female    DOB: 26-Oct-1924, 87 y.o.   MRN: [id number]  This visit occurred during the SARS-CoV-2 public health emergency.  Safety protocols were in place, including screening questions prior to the visit, additional usage of staff PPE, and extensive cleaning of exam room while observing appropriate contact time as indicated for disinfecting solutions.    HPI The patient is here for an acute visit.   Trouble with urination-she states deep yellow urine and wonders if it is related to some of the vitamins she started taking.  She also states frequent urination, but decreased urination.  Yesterday she saw cardiology because she was having shortness of breath and was told she was fluid overloaded and is taking an increased dose of Lasix for couple of days.  She has chronic numbness bottom of her feet-last night when she got up to go to the bathroom her feet were more numb than usual and she was not sure if she was in the middle of the walk.  She is not sure why that is.  She is unsure if her symptoms are related or not.    Medications and allergies reviewed with patient and updated if appropriate.  Patient Active Problem List   Diagnosis Date Noted   Iron deficiency anemia due to chronic blood loss 03/19/2020   Deficiency anemia 03/18/2020   Hypoproteinemia ([place]) 03/13/2020   Chronic right-sided low back pain without sciatica 05/09/2019   Fracture of vertebra due to osteoporosis ([place]) 12/28/2018   IBS (irritable bowel syndrome) 02/20/2018   Seasonal allergic rhinitis due to pollen 11/25/2016   (HFpEF) heart failure with preserved ejection fraction ([place]) 08/13/2016   GAD (generalized anxiety disorder) 02/18/2016   Essential hypertension, benign 09/04/2015   Severe tricuspid regurgitation 04/22/2015   Complete heart block ([place]) 02/26/2015   Chronic renal insufficiency, stage III (moderate) ([place]) 06/12/2012   Pacemaker-dual chamber   06/23/2010   Irritable bowel syndrome 12/01/2009   GERD 06/10/2009   Hypothyroidism 02/26/2008   OSTEOPOROSIS 02/26/2008    Current Outpatient Medications on File Prior to Visit  Medication Sig Dispense Refill   acetaminophen (TYLENOL) 650 MG CR tablet Take 1,300 mg by mouth 2 (two) times daily.     apixaban (ELIQUIS) 2.5 MG TABS tablet Take 1 tablet (2.5 mg total) by mouth 2 (two) times daily. Pt is overdue for follow-up, MUST see MD for FUTURE refills. 180 tablet 0   bifidobacterium infantis (ALIGN) capsule Take 2 capsules by mouth daily.     clonazePAM (KLONOPIN) 0.5 MG tablet Take 1 tablet (0.5 mg total) by mouth 2 (two) times daily as needed. for anxiety 180 tablet 1   fluconazole (DIFLUCAN) 200 MG tablet Take 200 mg by mouth once a week. For four months only     furosemide (LASIX) 40 MG tablet Take 1 tablet (40 mg total) by mouth daily. You may take an additional 1/2 tablet as needed for weight gain 90 tablet 0   levothyroxine (EUTHYROX) 50 MCG tablet Take 1 tablet (50 mcg total) by mouth daily before breakfast. 90 tablet 1   MISC NATURAL PRODUCTS EX Apply 1 application topically in the morning and at bedtime. CBD cream for arthritis pain     potassium chloride SA (KLOR-CON) 10 MEQ tablet Take 1 tablet (10 mEq total) by mouth daily. 90 tablet 0   No current facility-administered medications on file prior to visit.    Past Medical History:  Diagnosis Date  Atrial fibrillation ([place])    pacemaker, chronic anticoag   Breast cancer ([place])    Diverticulosis    Fibroma    inner lips/mouth   GERD (gastroesophageal reflux disease)    Hx of breast cancer    Hyperkalemia    Hypertension    Hypothyroidism    IBS (irritable bowel syndrome)    Internal hemorrhoids    Left inguinal hernia    direct   MVP (mitral valve prolapse)    Osteoporosis    Renal insufficiency    Thyroid cancer [facility])     Past Surgical History:  Procedure Laterality Date   COLONOSCOPY  10/28/2005   internal  hemorrhoids, diverticulosis (same as in 2002 and random bxs negative then)   EP IMPLANTABLE DEVICE N/A 02/26/2015   Procedure: PPM Generator Changeout;  Surgeon: [name], MD;  Location: [place] [facility];  Service: Cardiovascular;  Laterality: N/A;   MASTECTOMY  1982   Bilateral   PACEMAKER INSERTION     THYROIDECTOMY     TONSILLECTOMY     UPPER GASTROINTESTINAL ENDOSCOPY  09/06/2000   normal    Social History   Socioeconomic History   Marital status: Widowed    Spouse name: Not on file   Number of children: 3   Years of education: Not on file   Highest education level: Not on file  Occupational History   Occupation: Retired   Tobacco Use   Smoking status: Former    Types: Cigarettes    Quit date: 02/08/1954    Years since quitting: 66.7   Smokeless tobacco: Never  Vaping Use   Vaping Use: Never used  Substance and Sexual Activity   Alcohol use: No    Alcohol/week: 0.0 standard drinks   Drug use: No   Sexual activity: Not Currently  Other Topics Concern   Not on file  Social History Narrative   Regular Exercise: Yes   Daily Caffeine Use: Sometimes.            Social Determinants of Health   Financial Resource Strain: Not on file  Food Insecurity: Not on file  Transportation Needs: Not on file  Physical Activity: Not on file  Stress: Not on file  Social Connections: Not on file    Family History  Problem Relation Age of Onset   Colon cancer Father 8   Heart disease Father    Stroke Mother    Arthritis Other    Hypertension Other    Esophageal cancer Neg Hx    Pancreatic cancer Neg Hx    Kidney disease Neg Hx    Liver disease Neg Hx    Diabetes Neg Hx    Rectal cancer Neg Hx    Stomach cancer Neg Hx     Review of Systems  Constitutional:  Negative for chills and fever.  Cardiovascular:  Negative for leg swelling.  Genitourinary:  Positive for difficulty urinating (1 week) and frequency. Negative for dysuria and hematuria.  Neurological:   Positive for headaches (occ sinus). Negative for light-headedness.      Objective:   Vitals:   10/31/20 1543  BP: 126/82  Pulse: 82  Temp: 98.2 F (36.8 C)  SpO2: 96%   BP Readings from Last 3 Encounters:  10/31/20 126/82  10/30/20 136/70  07/23/20 (!) 156/88   Wt Readings from Last 3 Encounters:  10/31/20 121 lb (54.9 kg)  10/30/20 122 lb (55.3 kg)  07/23/20 119 lb (54 kg)   Body mass index is  19.09 kg/m.   Physical Exam    Constitutional:      General: She is not in acute distress.    Appearance: Normal appearance. She is not ill-appearing.  HENT:     Head: Normocephalic and atraumatic.  Abdominal:     General: There is no distension.     Palpations: Abdomen is soft.     Tenderness: There is no abdominal tenderness. There is no right CVA tenderness, left CVA tenderness, guarding or rebound.  Skin:    General: Skin is warm and dry.  Neurological:     Mental Status: She is alert.     Lab Results  Component Value Date   WBC 4.1 10/30/2020   HGB 13.4 10/30/2020   HCT 39.5 10/30/2020   PLT 179 10/30/2020   GLUCOSE 87 10/30/2020   CHOL 155 02/11/2014   TRIG 84.0 02/11/2014   HDL 46.10 02/11/2014   LDLCALC 92 02/11/2014   ALT 17 03/14/2020   AST 20 03/14/2020   NA 143 10/30/2020   K 5.0 10/30/2020   CL 102 10/30/2020   CREATININE 1.22 (H) 10/30/2020   BUN 31 10/30/2020   CO2 26 10/30/2020   TSH 5.13 (H) 05/22/2020   INR 1.3 (A) 03/17/2020   HGBA1C 5.7 06/12/2019     Assessment & Plan:    Acute cystitis: Acute Having about 1 week of dark urine, difficulty urinating and increasing frequency of urination Denies a history of urinary tract infections He is only able to give [place] a very small sample and it was very suggestive of UTI so we will go ahead and start an antibiotic.  She has had difficulty with antibiotics in the past and does not remember exactly which ones, but is very concerned about taking an antibiotic Start Bactrim DS 1 tab twice daily  x5 days Advised if she has any side effects to call Advised if her symptoms do not completely resolve we will get another sample so that we can have a better analysis and send for culture    Numbness in the feet is a chronic issue-likely neuropathy.  Can discuss with Dr. [name] if further evaluation is needed since she is not having any pain.

## 2020-10-31 NOTE — Patient Instructions (Signed)
Take the antibiotic as prescribed.  Take tylenol if needed.     Increase your water intake.   Call if no improvement     Urinary Tract Infection, Adult A urinary tract infection (UTI) is an infection of any part of the urinary tract, which includes the kidneys, ureters, bladder, and urethra. These organs make, store, and get rid of urine in the body. UTI can be a bladder infection (cystitis) or kidney infection (pyelonephritis). What are the causes? This infection may be caused by fungi, viruses, or bacteria. Bacteria are the most common cause of UTIs. This condition can also be caused by repeated incomplete emptying of the bladder during urination. What increases the risk? This condition is more likely to develop if:  You ignore your need to urinate or hold urine for long periods of time.  You do not empty your bladder completely during urination.  You wipe back to front after urinating or having a bowel movement, if you are female.  You are uncircumcised, if you are female.  You are constipated.  You have a urinary catheter that stays in place (indwelling).  You have a weak defense (immune) system.  You have a medical condition that affects your bowels, kidneys, or bladder.  You have diabetes.  You take antibiotic medicines frequently or for long periods of time, and the antibiotics no longer work well against certain types of infections (antibiotic resistance).  You take medicines that irritate your urinary tract.  You are exposed to chemicals that irritate your urinary tract.  You are female.  What are the signs or symptoms? Symptoms of this condition include:  Fever.  Frequent urination or passing small amounts of urine frequently.  Needing to urinate urgently.  Pain or burning with urination.  Urine that smells bad or unusual.  Cloudy urine.  Pain in the lower abdomen or back.  Trouble urinating.  Blood in the urine.  Vomiting or being less hungry than  normal.  Diarrhea or abdominal pain.  Vaginal discharge, if you are female.  How is this diagnosed? This condition is diagnosed with a medical history and physical exam. You will also need to provide a urine sample to test your urine. Other tests may be done, including:  Blood tests.  Sexually transmitted disease (STD) testing.  If you have had more than one UTI, a cystoscopy or imaging studies may be done to determine the cause of the infections. How is this treated? Treatment for this condition often includes a combination of two or more of the following:  Antibiotic medicine.  Other medicines to treat less common causes of UTI.  Over-the-counter medicines to treat pain.  Drinking enough water to stay hydrated.  Follow these instructions at home:  Take over-the-counter and prescription medicines only as told by your health care provider.  If you were prescribed an antibiotic, take it as told by your health care provider. Do not stop taking the antibiotic even if you start to feel better.  Avoid alcohol, caffeine, tea, and carbonated beverages. They can irritate your bladder.  Drink enough fluid to keep your urine clear or pale yellow.  Keep all follow-up visits as told by your health care provider. This is important.  Make sure to: ? Empty your bladder often and completely. Do not hold urine for long periods of time. ? Empty your bladder before and after sex. ? Wipe from front to back after a bowel movement if you are female. Use each tissue one time when you   wipe. Contact a health care provider if:  You have back pain.  You have a fever.  You feel nauseous or vomit.  Your symptoms do not get better after 3 days.  Your symptoms go away and then return. Get help right away if:  You have severe back pain or lower abdominal pain.  You are vomiting and cannot keep down any medicines or water. This information is not intended to replace advice given to you by  your health care provider. Make sure you discuss any questions you have with your health care provider. Document Released: 11/04/2004 Document Revised: 07/09/2015 Document Reviewed: 12/16/2014 Elsevier Interactive Patient Education  2018 Elsevier Inc.   

## 2020-11-03 NOTE — Addendum Note (Signed)
Addended by: Claude Manges on: 11/03/2020 08:00 AM   Modules accepted: Orders

## 2020-11-11 ENCOUNTER — Telehealth: Payer: Self-pay | Admitting: Cardiology

## 2020-11-11 ENCOUNTER — Telehealth: Payer: Self-pay | Admitting: Pharmacist

## 2020-11-11 ENCOUNTER — Telehealth: Payer: Self-pay | Admitting: Internal Medicine

## 2020-11-11 DIAGNOSIS — I5032 Chronic diastolic (congestive) heart failure: Secondary | ICD-10-CM

## 2020-11-11 NOTE — Progress Notes (Signed)
    Chronic Care Management Pharmacy Assistant   Name: JANIKA JEDLICKA  MRN: [id number] DOB: 05/28/1924  Reason for Encounter: Disease State   Conditions to be addressed/monitored: General Adherence  Recent office visits:  10/31/20 [place] (PCP) - Acute cystitis w/hematuria. Start Sulfamethoxazole-Trimethoprim 400-80 mg.   Recent consult visits:  10/30/20 Chalmers Cater, [place] (Cardiology) - Complete heart block. Change how to take Furosemide 40 mg. EKG done.  Hospital visits:  None in previous 6 months  Medications: Outpatient Encounter Medications as of 11/11/2020  Medication Sig   acetaminophen (TYLENOL) 650 MG CR tablet Take 1,300 mg by mouth 2 (two) times daily.   apixaban (ELIQUIS) 2.5 MG TABS tablet Take 1 tablet (2.5 mg total) by mouth 2 (two) times daily. Pt is overdue for follow-up, MUST see MD for FUTURE refills.   bifidobacterium infantis (ALIGN) capsule Take 2 capsules by mouth daily.   clonazePAM (KLONOPIN) 0.5 MG tablet Take 1 tablet (0.5 mg total) by mouth 2 (two) times daily as needed. for anxiety   fluconazole (DIFLUCAN) 200 MG tablet Take 200 mg by mouth once a week. For four months only   furosemide (LASIX) 40 MG tablet Take 1 tablet (40 mg total) by mouth daily. You may take an additional 1/2 tablet as needed for weight gain   levothyroxine (EUTHYROX) 50 MCG tablet Take 1 tablet (50 mcg total) by mouth daily before breakfast.   MISC NATURAL PRODUCTS EX Apply 1 application topically in the morning and at bedtime. CBD cream for arthritis pain   potassium chloride SA (KLOR-CON) 10 MEQ tablet Take 1 tablet (10 mEq total) by mouth daily.   No facility-administered encounter medications on file as of 11/11/2020.   Attempted contact with [name] 3 times on 10/4, 10/5 & 10/7. Unsuccessful outreach, will attempt again next month.     Star Rating Drugs: None   [name], [place] Clinical Pharmacists Assistant [phone]

## 2020-11-11 NOTE — Telephone Encounter (Signed)
Spoke with pt who reports she had minimal improvement in her SOB after her last visit with Oda Kilts, PA-C with increase of Lasix to 60mg  x 2 days.  She does sleep better when she takes the extra 1/2 tablet of Lasix 40mg  that was recommended.  She is asking if she should have a consistent increase in her diuretic.  She denies CP or edema.  She continues to complain of increased SOB on exertion and at night.  She has decreased her daily fluid intake and is monitoring her Na+ intake as well. Pt advised will forward request to Dr Caryl Comes for further review and recommendation.  Reviewed ED precautions.  Pt verbalizes understanding and agrees with current plan.

## 2020-11-11 NOTE — Telephone Encounter (Signed)
Pt c/o Shortness Of Breath: STAT if SOB developed within the last 24 hours or pt is noticeably SOB on the phone  1. Are you currently SOB (can you hear that pt is SOB on the phone)? Yes  2. How long have you been experiencing SOB? For a while  3. Are you SOB when sitting or when up moving around? Both especially at night  4. Are you currently experiencing any other symptoms? Constipation real bad  Patient stated what was told to her on 9/22 hasnt work. She wants to see Dr. Caryl Comes

## 2020-11-11 NOTE — Telephone Encounter (Signed)
Pt called back about instructions.  She will take lasix 80 mg daily for 3 days and call back to get further recommendations, she wanted to touch base after the higher dose.  I asked her to call if she started feeling weak on higher dose as well.

## 2020-11-11 NOTE — Telephone Encounter (Signed)
Attempted phone call to pt and left voicemail message to contact RN at [phone]. 

## 2020-11-12 MED ORDER — FUROSEMIDE 40 MG PO TABS: 80.0000 mg | ORAL_TABLET | ORAL | 0 refills | Status: DC

## 2020-11-12 NOTE — Telephone Encounter (Signed)
Spoke with pt and reviewed Dr Olin Pia instructions to take Furosemide 40mg  - (2 tablets-80mg ) by mouth daily x 3 days then begin taking Furosemide 80mg  every other day.  Contact office if Sx continue.  Reviewed ED precautions.  Pt verbalizes understanding and agrees with current plan.

## 2020-11-12 NOTE — Telephone Encounter (Signed)
Patient states she is returning a call from this morning.

## 2020-11-12 NOTE — Addendum Note (Signed)
Addended by: Thora Lance on: 11/12/2020 08:33 AM   Modules accepted: Orders

## 2020-11-13 ENCOUNTER — Telehealth: Payer: Self-pay | Admitting: Internal Medicine

## 2020-11-13 DIAGNOSIS — I5032 Chronic diastolic (congestive) heart failure: Secondary | ICD-10-CM

## 2020-11-13 NOTE — Telephone Encounter (Signed)
Spoke with pt who reports she has taken her second day of Furosemide 80mg .  Pt reports she has had some increase in urination but has only had a cup of coffee and a half glass of water today (12 oz total liquid).  She states she feels a little dizzy and  sleepy, not quite herself but has slept well the last 2 nights and only occasional minimal SOB.  Pt states she is going to hold her Furosemide 80mg  tomorrow and will begin alternating every other day as instructed by Dr Caryl Comes.  Pt advised to change positions slowly and try to increase her intake some this afternoon and try eating several small meals although she has been advised restrict her fluids.  Reviewed ED precautions and on call [place] availability.  Pt verbalizes understanding and thanked Therapist, sports for the call.

## 2020-11-13 NOTE — Telephone Encounter (Signed)
Alyssa [name] called requesting to speak with you regarding medication that was prescribed

## 2020-11-14 NOTE — Progress Notes (Signed)
Have you had any problems recently with your health? Patient states she has been experiencing SOB and Dr. [name] recently started her on Furosemide 80 mg.  Have you had any problems with your pharmacy? Patient states her kids made her switch from [place] to [facility] because it was closer to her home but no problems.   What issues or side effects are you having with your medications? Patient states she was taking Furosemide 80 mg, 2x a day and it made her sick.   What would you like me to pass along to [facility] for them to help you with?  Patient states she will start taking 1.5 pills of Furosemide tomorrow after speaking with the nurse at Dr. Caryl Comes office.   What can we do to take care of you better? Patient states not at this time.

## 2020-11-17 ENCOUNTER — Telehealth: Payer: Self-pay | Admitting: Internal Medicine

## 2020-11-17 MED ORDER — FUROSEMIDE 40 MG PO TABS: 60.0000 mg | ORAL_TABLET | Freq: Every day | ORAL | 1 refills | Status: DC

## 2020-11-17 NOTE — Telephone Encounter (Signed)
Pt c/o medication issue:  1. Name of Medication: furosemide (LASIX) 40 MG tablet  2. How are you currently taking this medication (dosage and times per day)? Take 1.5 tablets (60 mg total) by mouth daily.  3. Are you having a reaction (difficulty breathing--STAT)? No   4. What is your medication issue? Patient says that it makes her dizzy

## 2020-11-17 NOTE — Telephone Encounter (Signed)
Patient called in to get her update on how the medication is going. Please advise

## 2020-11-17 NOTE — Telephone Encounter (Signed)
Spoke with pt who reports she has been unable to tolerated the Furosemide 80mg  qod as recommended first by Dr Caryl Comes so she started the Furosemide 60mg  daily as also suggested by Dr Caryl Comes and is tolerating this dose without problems and is feeling better as far as her SOB.  She states she is having some problems with post nasal drip, cough and back pain for which she plans on contact her PCP to schedule appointment.  RN thanked pt for the update.

## 2020-11-17 NOTE — Addendum Note (Signed)
Addended by: Thora Lance on: 11/17/2020 09:35 AM   Modules accepted: Orders

## 2020-11-17 NOTE — Telephone Encounter (Signed)
Spoke with pt who states she got up from a sitting position and became dizzy (lightheadedness) and felt nauseous for just a minute.  Prior to this episode today she has been feeling well and even did chair exercises with her Zoom group.  Current BP 153/82 and HR - 73.  Pt has changed her Furosemide dose to 60mg  daily as suggested by Dr Caryl Comes due to not being able to tolerate original dose change of 80mg  qod.  Pt reports SOB has improved and she is sleeping well at night.  Discussed position change previously with pt who states she did not stand quickly this afternoon. Pt is eating and drinking without issues.  Pt reports she is feeling fine now.She is asking if she should continue on the Furosemide 60mg  daily due to this episode of lightheadedness.   Will forward to Oda Kilts, PA-C for review and recommendation.  Pt verbalizes understanding and agrees with current plan.

## 2020-11-18 ENCOUNTER — Encounter: Payer: Self-pay | Admitting: Internal Medicine

## 2020-11-18 NOTE — Telephone Encounter (Signed)
Patient was calling back to speak to the nurse. Please advise

## 2020-11-18 NOTE — Progress Notes (Deleted)
Subjective:    Patient ID: Alyssa [name], female    DOB: 09-18-1924, 87 y.o.   MRN: [id number]  This visit occurred during the SARS-CoV-2 public health emergency.  Safety protocols were in place, including screening questions prior to the visit, additional usage of staff PPE, and extensive cleaning of exam room while observing appropriate contact time as indicated for disinfecting solutions.    HPI The patient is here for an acute visit.  Cardiology increased her lasix to 80 mg daily and she was dizzy, sleepy and not herself.  She then went to QOD per Dr Caryl Comes.  She did not tolerate this so went to 60 mg daily per Dr Caryl Comes.  Her SOB was better and she tolerated this dose.  She did hold the dose yesterday.     Having PND, cough and back pain -    Medications and allergies reviewed with patient and updated if appropriate.  Patient Active Problem List   Diagnosis Date Noted   Iron deficiency anemia due to chronic blood loss 03/19/2020   Deficiency anemia 03/18/2020   Hypoproteinemia ([place]) 03/13/2020   Chronic right-sided low back pain without sciatica 05/09/2019   Fracture of vertebra due to osteoporosis ([place]) 12/28/2018   IBS (irritable bowel syndrome) 02/20/2018   Seasonal allergic rhinitis due to pollen 11/25/2016   (HFpEF) heart failure with preserved ejection fraction ([place]) 08/13/2016   GAD (generalized anxiety disorder) 02/18/2016   Essential hypertension, benign 09/04/2015   Severe tricuspid regurgitation 04/22/2015   Complete heart block ([place]) 02/26/2015   Chronic renal insufficiency, stage III (moderate) ([place]) 06/12/2012   Pacemaker-dual chamber  06/23/2010   Irritable bowel syndrome 12/01/2009   GERD 06/10/2009   Hypothyroidism 02/26/2008   OSTEOPOROSIS 02/26/2008    Current Outpatient Medications on File Prior to Visit  Medication Sig Dispense Refill   acetaminophen (TYLENOL) 650 MG CR tablet Take 1,300 mg by mouth 2 (two) times daily.     apixaban (ELIQUIS) 2.5  MG TABS tablet Take 1 tablet (2.5 mg total) by mouth 2 (two) times daily. Pt is overdue for follow-up, MUST see MD for FUTURE refills. 180 tablet 0   bifidobacterium infantis (ALIGN) capsule Take 2 capsules by mouth daily.     clonazePAM (KLONOPIN) 0.5 MG tablet Take 1 tablet (0.5 mg total) by mouth 2 (two) times daily as needed. for anxiety 180 tablet 1   fluconazole (DIFLUCAN) 200 MG tablet Take 200 mg by mouth once a week. For four months only     furosemide (LASIX) 40 MG tablet Take 1.5 tablets (60 mg total) by mouth daily. 135 tablet 1   levothyroxine (EUTHYROX) 50 MCG tablet Take 1 tablet (50 mcg total) by mouth daily before breakfast. 90 tablet 1   MISC NATURAL PRODUCTS EX Apply 1 application topically in the morning and at bedtime. CBD cream for arthritis pain     potassium chloride SA (KLOR-CON) 10 MEQ tablet Take 1 tablet (10 mEq total) by mouth daily. 90 tablet 0   No current facility-administered medications on file prior to visit.    Past Medical History:  Diagnosis Date   Atrial fibrillation ([place])    pacemaker, chronic anticoag   Breast cancer ([place])    Diverticulosis    Fibroma    inner lips/mouth   GERD (gastroesophageal reflux disease)    Hx of breast cancer    Hyperkalemia    Hypertension    Hypothyroidism    IBS (irritable bowel syndrome)    Internal hemorrhoids  Left inguinal hernia    direct   MVP (mitral valve prolapse)    Osteoporosis    Renal insufficiency    Thyroid cancer [facility])     Past Surgical History:  Procedure Laterality Date   COLONOSCOPY  10/28/2005   internal hemorrhoids, diverticulosis (same as in 2002 and random bxs negative then)   EP IMPLANTABLE DEVICE N/A 02/26/2015   Procedure: PPM Generator Changeout;  Surgeon: [name], MD;  Location: [facility];  Service: Cardiovascular;  Laterality: N/A;   MASTECTOMY  1982   Bilateral   PACEMAKER INSERTION     THYROIDECTOMY     TONSILLECTOMY     UPPER GASTROINTESTINAL ENDOSCOPY   09/06/2000   normal    Social History   Socioeconomic History   Marital status: Widowed    Spouse name: Not on file   Number of children: 3   Years of education: Not on file   Highest education level: Not on file  Occupational History   Occupation: Retired   Tobacco Use   Smoking status: Former    Types: Cigarettes    Quit date: 02/08/1954    Years since quitting: 66.8   Smokeless tobacco: Never  Vaping Use   Vaping Use: Never used  Substance and Sexual Activity   Alcohol use: No    Alcohol/week: 0.0 standard drinks   Drug use: No   Sexual activity: Not Currently  Other Topics Concern   Not on file  Social History Narrative   Regular Exercise: Yes   Daily Caffeine Use: Sometimes.            Social Determinants of Health   Financial Resource Strain: Not on file  Food Insecurity: Not on file  Transportation Needs: Not on file  Physical Activity: Not on file  Stress: Not on file  Social Connections: Not on file    Family History  Problem Relation Age of Onset   Colon cancer Father 11   Heart disease Father    Stroke Mother    Arthritis Other    Hypertension Other    Esophageal cancer Neg Hx    Pancreatic cancer Neg Hx    Kidney disease Neg Hx    Liver disease Neg Hx    Diabetes Neg Hx    Rectal cancer Neg Hx    Stomach cancer Neg Hx     Review of Systems     Objective:  There were no vitals filed for this visit. BP Readings from Last 3 Encounters:  10/31/20 126/82  10/30/20 136/70  07/23/20 (!) 156/88   Wt Readings from Last 3 Encounters:  10/31/20 121 lb (54.9 kg)  10/30/20 122 lb (55.3 kg)  07/23/20 119 lb (54 kg)   There is no height or weight on file to calculate BMI.   Physical Exam         Assessment & Plan:    See Problem List for Assessment and Plan of chronic medical problems.

## 2020-11-18 NOTE — Telephone Encounter (Signed)
Spoke with pt and advised per Oda Kilts, PA-C please monitor episodes of dizziness/lightheadedness and check BP and HR.  Pt reports she has lost about 5-6 pounds since seeing Jonni Sanger and increasing her Furosemide dose.  Pt reports she has had no SOB over the last few days and is able to sleep at night.  She states she is hesitant to take  and may hold her Furosemide today as she has a hair appointment and will have to sit for a few hours.  She continues to monitor/limit her fluid and Na+ intake.   Pt is agreeable with PA's recommendation and will keep log.  Pt thanked Therapist, sports for the call.

## 2020-11-19 ENCOUNTER — Ambulatory Visit: Payer: Medicare Other | Admitting: Internal Medicine

## 2020-11-21 NOTE — Telephone Encounter (Signed)
Patient called and wanted to speak to [facility] again.

## 2020-11-21 NOTE — Telephone Encounter (Signed)
Spoke with pt who reports she did have one brief episode of dizziness last night on standing.  Pt states she felt well yesterday and was very active.  She continues to sleep will at night on the increased dose of Lasix without waking SOB.  She still has some times during the day where she might have some SOB on exertion.  Pt states she had to cancel her PCP appointment on Wednesday due to not feeling like going.  She did reschedule for 11/25/2020. Pt advised to continue taking current medications and see PCP on 10/18 for further evaluation.  Pt verbalizes understanding and thanked Therapist, sports for the phone call.

## 2020-11-25 ENCOUNTER — Encounter: Payer: Self-pay | Admitting: Internal Medicine

## 2020-11-25 ENCOUNTER — Other Ambulatory Visit: Payer: Self-pay

## 2020-11-25 ENCOUNTER — Other Ambulatory Visit: Payer: Self-pay | Admitting: Internal Medicine

## 2020-11-25 ENCOUNTER — Ambulatory Visit (INDEPENDENT_AMBULATORY_CARE_PROVIDER_SITE_OTHER): Payer: Medicare Other | Admitting: Internal Medicine

## 2020-11-25 VITALS — BP 116/80 | HR 84 | Temp 98.0°F | Ht 66.75 in | Wt 119.0 lb

## 2020-11-25 DIAGNOSIS — I5033 Acute on chronic diastolic (congestive) heart failure: Secondary | ICD-10-CM | POA: Diagnosis not present

## 2020-11-25 DIAGNOSIS — E039 Hypothyroidism, unspecified: Secondary | ICD-10-CM

## 2020-11-25 DIAGNOSIS — Z23 Encounter for immunization: Secondary | ICD-10-CM

## 2020-11-25 DIAGNOSIS — J019 Acute sinusitis, unspecified: Secondary | ICD-10-CM | POA: Diagnosis not present

## 2020-11-25 DIAGNOSIS — R42 Dizziness and giddiness: Secondary | ICD-10-CM

## 2020-11-25 NOTE — Addendum Note (Signed)
Addended by: Marcina Millard on: 11/25/2020 05:00 PM   Modules accepted: Orders

## 2020-11-25 NOTE — Patient Instructions (Addendum)
     Flu immunization administered today.      Medications changes include :   you can try the mucinex if you want.     Follow up with cardiology about your fluid allowance.     If your swallowing issues continue let us - we may need to do a test to further evaluate your swallowing.

## 2020-11-25 NOTE — Progress Notes (Signed)
Subjective:    Patient ID: Alyssa [name], female    DOB: [birth year], 87 y.o.   MRN: [id number]  This visit occurred during the SARS-CoV-2 public health emergency.  Safety protocols were in place, including screening questions prior to the visit, additional usage of staff PPE, and extensive cleaning of exam room while observing appropriate contact time as indicated for disinfecting solutions.    HPI The patient is here for an acute visit.  Dizziness - she was dizzy when she was on two lasix a day, but has done well with 1.5 pills a day.  She no longer feels dizzy when she gets up, except on occasion at night very mild and transient.  She denies SOB and she is sleeping better.   She has sinus problems - she keeps getting mucus all the time up.  She has to cough a lot to get it up.  She has not tried mucinex, but has it and wonders if she can take it.  She knows she has to drink a lot of water when she takes it and wanted to wait until she is able to drink more water.  She denies any fevers, sinus pain or pressure.  She denies significant congestion, but does have a runny nose.  She has a limitation with how much water she can drink.  Her throat is dry.  She feels like something is in her throat and at times has difficulty swallowing.  She is hoping that cardiology will allow her to drink a little more water.    She has a feeling in her feet like she is walking on rocks.  She denies pain.    She has an ache in her right lower back and that is getting better with cbd cream.    She wanted to have her ears checked.    Medications and allergies reviewed with patient and updated if appropriate.  Patient Active Problem List   Diagnosis Date Noted   Iron deficiency anemia due to chronic blood loss 03/19/2020   Deficiency anemia 03/18/2020   Hypoproteinemia ([place]) 03/13/2020   Chronic right-sided low back pain without sciatica 05/09/2019   Fracture of vertebra due to osteoporosis ([place])  12/28/2018   IBS (irritable bowel syndrome) 02/20/2018   Seasonal allergic rhinitis due to pollen 11/25/2016   (HFpEF) heart failure with preserved ejection fraction ([place]) 08/13/2016   GAD (generalized anxiety disorder) 02/18/2016   Essential hypertension, benign 09/04/2015   Severe tricuspid regurgitation 04/22/2015   Complete heart block ([place]) 02/26/2015   Chronic renal insufficiency, stage III (moderate) ([place]) 06/12/2012   Pacemaker-dual chamber  06/23/2010   Irritable bowel syndrome 12/01/2009   GERD 06/10/2009   Hypothyroidism 02/26/2008   OSTEOPOROSIS 02/26/2008    Current Outpatient Medications on File Prior to Visit  Medication Sig Dispense Refill   acetaminophen (TYLENOL) 650 MG CR tablet Take 1,300 mg by mouth 2 (two) times daily.     apixaban (ELIQUIS) 2.5 MG TABS tablet Take 1 tablet (2.5 mg total) by mouth 2 (two) times daily. Pt is overdue for follow-up, MUST see MD for FUTURE refills. 180 tablet 0   bifidobacterium infantis (ALIGN) capsule Take 2 capsules by mouth daily.     clonazePAM (KLONOPIN) 0.5 MG tablet Take 1 tablet (0.5 mg total) by mouth 2 (two) times daily as needed. for anxiety 180 tablet 1   fluconazole (DIFLUCAN) 200 MG tablet Take 200 mg by mouth once a week. For four months only  furosemide (LASIX) 40 MG tablet Take 1.5 tablets (60 mg total) by mouth daily. 135 tablet 1   MISC NATURAL PRODUCTS EX Apply 1 application topically in the morning and at bedtime. CBD cream for arthritis pain     potassium chloride SA (KLOR-CON) 10 MEQ tablet Take 1 tablet (10 mEq total) by mouth daily. 90 tablet 0   No current facility-administered medications on file prior to visit.    Past Medical History:  Diagnosis Date   Atrial fibrillation ([place])    pacemaker, chronic anticoag   Breast cancer ([place])    Diverticulosis    Fibroma    inner lips/mouth   GERD (gastroesophageal reflux disease)    Hx of breast cancer    Hyperkalemia    Hypertension    Hypothyroidism     IBS (irritable bowel syndrome)    Internal hemorrhoids    Left inguinal hernia    direct   MVP (mitral valve prolapse)    Osteoporosis    Renal insufficiency    Thyroid cancer [facility])     Past Surgical History:  Procedure Laterality Date   COLONOSCOPY  10/28/2005   internal hemorrhoids, diverticulosis (same as in 2002 and random bxs negative then)   EP IMPLANTABLE DEVICE N/A 02/26/2015   Procedure: PPM Generator Changeout;  Surgeon: [name], MD;  Location: [facility];  Service: Cardiovascular;  Laterality: N/A;   MASTECTOMY  1982   Bilateral   PACEMAKER INSERTION     THYROIDECTOMY     TONSILLECTOMY     UPPER GASTROINTESTINAL ENDOSCOPY  09/06/2000   normal    Social History   Socioeconomic History   Marital status: Widowed    Spouse name: Not on file   Number of children: 3   Years of education: Not on file   Highest education level: Not on file  Occupational History   Occupation: Retired   Tobacco Use   Smoking status: Former    Types: Cigarettes    Quit date: 02/08/1954    Years since quitting: 66.8   Smokeless tobacco: Never  Vaping Use   Vaping Use: Never used  Substance and Sexual Activity   Alcohol use: No    Alcohol/week: 0.0 standard drinks   Drug use: No   Sexual activity: Not Currently  Other Topics Concern   Not on file  Social History Narrative   Regular Exercise: Yes   Daily Caffeine Use: Sometimes.            Social Determinants of Health   Financial Resource Strain: Not on file  Food Insecurity: Not on file  Transportation Needs: Not on file  Physical Activity: Not on file  Stress: Not on file  Social Connections: Not on file    Family History  Problem Relation Age of Onset   Colon cancer Father 29   Heart disease Father    Stroke Mother    Arthritis Other    Hypertension Other    Esophageal cancer Neg Hx    Pancreatic cancer Neg Hx    Kidney disease Neg Hx    Liver disease Neg Hx    Diabetes Neg Hx    Rectal  cancer Neg Hx    Stomach cancer Neg Hx     Review of Systems  Constitutional:  Negative for chills and fever.  HENT:  Positive for rhinorrhea and trouble swallowing (pills, food - worse in last week). Negative for congestion, postnasal drip, sinus pressure, sinus pain and sore throat.  Respiratory:  Negative for cough.   Gastrointestinal:  Negative for nausea.       No gerd  Neurological:  Positive for dizziness (occ, transient - improved). Negative for headaches.      Objective:   Vitals:   11/25/20 1313  BP: 116/80  Pulse: 84  Temp: 98 F (36.7 C)  SpO2: 98%   BP Readings from Last 3 Encounters:  11/25/20 116/80  10/31/20 126/82  10/30/20 136/70   Wt Readings from Last 3 Encounters:  11/25/20 119 lb (54 kg)  10/31/20 121 lb (54.9 kg)  10/30/20 122 lb (55.3 kg)   Body mass index is 18.78 kg/m.   Physical Exam    GENERAL APPEARANCE: Appears stated age, well appearing, NAD EYES: conjunctiva clear, no icterus HENT: bilateral tympanic membranes and ear canals normal, oropharynx with no erythema or exudates, trachea midline, no cervical or supraclavicular lymphadenopathy LUNGS: Unlabored breathing, good air entry bilaterally, clear to auscultation without wheeze or crackles CARDIOVASCULAR: Normal V2,N1 , 2/6 systolic murmur, no edema SKIN: Warm, dry      Assessment & Plan:    Sinusitis: Acute No evidence of infection so an antibiotic is not necessary She does have more of a runny nose and it could be related to allergies or viral infection Can try Mucinex, but feels she needs to be able to drink more water so she will wait until she sees or talks to cardiology about that Continue saline nasal spray  Heart failure with preserved ejection fracture: Chronic Following with cardiology Appears euvolemic on exam Doing well currently with 60 mg of Lasix daily Has cardiology follow-up  Dizziness: Acute She had some transient dizziness while on Lasix 80 mg  daily, but when she decreased this to 60 mg daily the dizziness improved She does on occasion have transient, mild dizziness with quick movements, which may be related to her low BP, but is mild and very tolerable I do not think she needs any changes in medications She will discuss with cardiology if she can increase her fluids a little   Flu vaccine today   I spent 20 minutes dedicated to the care of this patient on the date of this encounter including review of recent labs, imaging and procedures, speciality notes, obtaining history, communicating with the patient and documenting clinical information in the EHR

## 2020-12-01 ENCOUNTER — Telehealth: Payer: Self-pay | Admitting: Internal Medicine

## 2020-12-01 ENCOUNTER — Other Ambulatory Visit: Payer: Self-pay

## 2020-12-01 MED ORDER — APIXABAN 2.5 MG PO TABS: 2.5000 mg | ORAL_TABLET | Freq: Two times a day (BID) | ORAL | 0 refills | Status: DC

## 2020-12-01 NOTE — Telephone Encounter (Signed)
Pt states she has become increasingly thirsty being on the increased dosage of Lasix.  Pt states she has followed recommendation of Oda Kilts, [place] of only drinking 32oz of liquid daily.  She is tolerating the Furosemide 60mg  daily now without any problems.  Denied edema or SOB.  She is asking if she can increase her fluid intake.  Pt advised there is a very fine line for pt's with HF and it is important to limit fluids.  She has tried OTC gum for dry mouth which has not helped.  Recommended pt try increasing her water intake by 4oz for a few days but to monitor for S/Sx of volume overload.  Pt advised if she begins to have increase SOB again or feels as if she is retaining any fluid she should reduce fluid intake back to 32oz.  Continue medications as prescribed.  Pt verbalizes understanding and thanked Therapist, sports for the call.

## 2020-12-01 NOTE — Telephone Encounter (Signed)
Prescription refill request for Eliquis received. Indication:  Last office visit:10/30/20 ([place])  Scr: 1.22 (10/30/20) Age: 87 Weight: 54kg  Appropriate dose and refill sent to requested pharmacy.

## 2020-12-01 NOTE — Telephone Encounter (Signed)
  Patient is calling to follow up with Rosann Auerbach in regards to the phone note from 11/17/20.

## 2020-12-02 ENCOUNTER — Encounter: Payer: Medicare Other | Admitting: Internal Medicine

## 2020-12-10 ENCOUNTER — Other Ambulatory Visit: Payer: Self-pay | Admitting: Internal Medicine

## 2020-12-10 DIAGNOSIS — I5032 Chronic diastolic (congestive) heart failure: Secondary | ICD-10-CM

## 2020-12-22 ENCOUNTER — Telehealth: Payer: Self-pay | Admitting: Internal Medicine

## 2020-12-22 NOTE — Telephone Encounter (Signed)
    1. Has your device fired?   2. Is you device beeping?   3. Are you experiencing draining or swelling at device site?   4. Are you calling to see if we received your device transmission? Yes, pt said monitor was not working and she called [employer], she was told battery is low and they will send her a new monitor in 7-10 days. She wanted to know if we got any transmission, if not she needs to r/s her transmission appt   5. Have you passed out?    Please route to [place]

## 2020-12-22 NOTE — Telephone Encounter (Signed)
LMOVM letting the patient know that we got her message about [employer] sending her a new monitor. I cancelled her appointment and left the device clinic number to call once she receive her new monitor.

## 2020-12-28 ENCOUNTER — Other Ambulatory Visit: Payer: Self-pay | Admitting: Student

## 2020-12-28 DIAGNOSIS — I5032 Chronic diastolic (congestive) heart failure: Secondary | ICD-10-CM

## 2021-01-08 DIAGNOSIS — R040 Epistaxis: Secondary | ICD-10-CM | POA: Diagnosis not present

## 2021-01-08 NOTE — Progress Notes (Deleted)
Subjective:    Patient ID: Alyssa [name], female    DOB: 04-Jul-1924, 87 y.o.   MRN: [id number]  This visit occurred during the SARS-CoV-2 public health emergency.  Safety protocols were in place, including screening questions prior to the visit, additional usage of staff PPE, and extensive cleaning of exam room while observing appropriate contact time as indicated for disinfecting solutions.    HPI The patient is here for an acute visit.  Ear symptoms -    Medications and allergies reviewed with patient and updated if appropriate.  Patient Active Problem List   Diagnosis Date Noted   Iron deficiency anemia due to chronic blood loss 03/19/2020   Deficiency anemia 03/18/2020   Hypoproteinemia ([place]) 03/13/2020   Chronic right-sided low back pain without sciatica 05/09/2019   Fracture of vertebra due to osteoporosis ([place]) 12/28/2018   IBS (irritable bowel syndrome) 02/20/2018   Seasonal allergic rhinitis due to pollen 11/25/2016   (HFpEF) heart failure with preserved ejection fraction ([place]) 08/13/2016   GAD (generalized anxiety disorder) 02/18/2016   Essential hypertension, benign 09/04/2015   Severe tricuspid regurgitation 04/22/2015   Complete heart block ([place]) 02/26/2015   Chronic renal insufficiency, stage III (moderate) ([place]) 06/12/2012   Pacemaker-dual chamber  06/23/2010   Irritable bowel syndrome 12/01/2009   GERD 06/10/2009   Hypothyroidism 02/26/2008   OSTEOPOROSIS 02/26/2008    Current Outpatient Medications on File Prior to Visit  Medication Sig Dispense Refill   acetaminophen (TYLENOL) 650 MG CR tablet Take 1,300 mg by mouth 2 (two) times daily.     apixaban (ELIQUIS) 2.5 MG TABS tablet Take 1 tablet (2.5 mg total) by mouth 2 (two) times daily. Pt is overdue for follow-up, MUST see MD for FUTURE refills. 180 tablet 0   bifidobacterium infantis (ALIGN) capsule Take 2 capsules by mouth daily.     clonazePAM (KLONOPIN) 0.5 MG tablet Take 1 tablet (0.5 mg total) by  mouth 2 (two) times daily as needed. for anxiety 180 tablet 1   fluconazole (DIFLUCAN) 200 MG tablet Take 200 mg by mouth once a week. For four months only     furosemide (LASIX) 40 MG tablet TAKE 1 TABLET BY MOUTH DAILY, MAY TAKE ADDITIONAL 1/2 TABLET AS NEEDED FOR WEIGHT GAIN 90 tablet 3   levothyroxine (SYNTHROID) 50 MCG tablet TAKE 1 TABLET(50 MCG) BY MOUTH DAILY BEFORE AND BREAKFAST 90 tablet 0   MISC NATURAL PRODUCTS EX Apply 1 application topically in the morning and at bedtime. CBD cream for arthritis pain     potassium chloride (KLOR-CON) 10 MEQ tablet TAKE 1 TABLET(10 MEQ) BY MOUTH DAILY 90 tablet 0   No current facility-administered medications on file prior to visit.    Past Medical History:  Diagnosis Date   Atrial fibrillation ([place])    pacemaker, chronic anticoag   Breast cancer ([place])    Diverticulosis    Fibroma    inner lips/mouth   GERD (gastroesophageal reflux disease)    Hx of breast cancer    Hyperkalemia    Hypertension    Hypothyroidism    IBS (irritable bowel syndrome)    Internal hemorrhoids    Left inguinal hernia    direct   MVP (mitral valve prolapse)    Osteoporosis    Renal insufficiency    Thyroid cancer [facility])     Past Surgical History:  Procedure Laterality Date   COLONOSCOPY  10/28/2005   internal hemorrhoids, diverticulosis (same as in 2002 and random bxs negative then)  EP IMPLANTABLE DEVICE N/A 02/26/2015   Procedure: PPM Generator Changeout;  Surgeon: [name], MD;  Location: [facility];  Service: Cardiovascular;  Laterality: N/A;   MASTECTOMY  1982   Bilateral   PACEMAKER INSERTION     THYROIDECTOMY     TONSILLECTOMY     UPPER GASTROINTESTINAL ENDOSCOPY  09/06/2000   normal    Social History   Socioeconomic History   Marital status: Widowed    Spouse name: Not on file   Number of children: 3   Years of education: Not on file   Highest education level: Not on file  Occupational History   Occupation: Retired    Tobacco Use   Smoking status: Former    Types: Cigarettes    Quit date: 02/08/1954    Years since quitting: 66.9   Smokeless tobacco: Never  Vaping Use   Vaping Use: Never used  Substance and Sexual Activity   Alcohol use: No    Alcohol/week: 0.0 standard drinks   Drug use: No   Sexual activity: Not Currently  Other Topics Concern   Not on file  Social History Narrative   Regular Exercise: Yes   Daily Caffeine Use: Sometimes.            Social Determinants of Health   Financial Resource Strain: Not on file  Food Insecurity: Not on file  Transportation Needs: Not on file  Physical Activity: Not on file  Stress: Not on file  Social Connections: Not on file    Family History  Problem Relation Age of Onset   Colon cancer Father 71   Heart disease Father    Stroke Mother    Arthritis Other    Hypertension Other    Esophageal cancer Neg Hx    Pancreatic cancer Neg Hx    Kidney disease Neg Hx    Liver disease Neg Hx    Diabetes Neg Hx    Rectal cancer Neg Hx    Stomach cancer Neg Hx     Review of Systems     Objective:  There were no vitals filed for this visit. BP Readings from Last 3 Encounters:  11/25/20 116/80  10/31/20 126/82  10/30/20 136/70   Wt Readings from Last 3 Encounters:  11/25/20 119 lb (54 kg)  10/31/20 121 lb (54.9 kg)  10/30/20 122 lb (55.3 kg)   There is no height or weight on file to calculate BMI.   Physical Exam    GENERAL APPEARANCE: Appears stated age, well appearing, NAD EYES: conjunctiva clear, no icterus HENT: bilateral tympanic membranes and ear canals normal, oropharynx with no erythema or exudates, trachea midline, no cervical or supraclavicular lymphadenopathy LUNGS: Unlabored breathing, good air entry bilaterally, clear to auscultation without wheeze or crackles CARDIOVASCULAR: Normal S1,S2 , no edema SKIN: Warm, dry      Assessment & Plan:    See Problem List for Assessment and Plan of chronic medical problems.

## 2021-01-09 ENCOUNTER — Telehealth: Payer: Self-pay | Admitting: Internal Medicine

## 2021-01-09 ENCOUNTER — Ambulatory Visit: Payer: Medicare Other | Admitting: Internal Medicine

## 2021-01-09 NOTE — Telephone Encounter (Signed)
pt has a bloody nose and is on Eliquis. Pt would like to speak with nurse in regards to this.Marland Kitchen pt thinks this issue ongoing due to sinuses    Pt c/o medication issue:  1. Name of Medication: apixaban (ELIQUIS) 2.5 MG TABS tablet  2. How are you currently taking this medication (dosage and times per day)? Take 1 tablet (2.5 mg total) by mouth 2 (two) times daily. Pt is overdue for follow-up, MUST see MD for FUTURE refills.  3. Are you having a reaction (difficulty breathing--STAT)? Bloody nose  4. What is your medication issue?  pt has a bloody nose and is on Eliquis. Pt would like to speak with you in regards to this

## 2021-01-09 NOTE — Telephone Encounter (Signed)
Spoke with pt re message and pt is starting to have nosebleeds again this year Per pt had this last year during this time and was hospitalized with one episode Per pt was seen yesterday by Dr Benjamine Mola and nose was cauterized  Pt had nosebleed again last night  and used Afrin  and self packed nose pt also is using humidifier  Pt also was told by Dr Benjamine Mola to hold last nights Eliquis Pt is questioning about taking Eliquis and instructed pt to take med Will forward to Dr Caryl Comes for review and recommendations ./cy

## 2021-01-12 ENCOUNTER — Ambulatory Visit (INDEPENDENT_AMBULATORY_CARE_PROVIDER_SITE_OTHER): Payer: Medicare Other

## 2021-01-12 DIAGNOSIS — I442 Atrioventricular block, complete: Secondary | ICD-10-CM | POA: Diagnosis not present

## 2021-01-13 LAB — CUP PACEART REMOTE DEVICE CHECK
Battery Impedance: 1067 Ohm
Battery Remaining Longevity: 54 mo
Battery Voltage: 2.78 V
Brady Statistic RV Percent Paced: 99 %
Date Time Interrogation Session: 20221205112310
Implantable Lead Implant Date: 20000921
Implantable Lead Implant Date: 20000921
Implantable Lead Location: 753859
Implantable Lead Location: 753860
Implantable Pulse Generator Implant Date: 20170118
Lead Channel Impedance Value: 549 Ohm
Lead Channel Impedance Value: 67 Ohm
Lead Channel Pacing Threshold Amplitude: 0.75 V
Lead Channel Pacing Threshold Pulse Width: 0.4 ms
Lead Channel Setting Pacing Amplitude: 2.5 V
Lead Channel Setting Pacing Pulse Width: 0.4 ms
Lead Channel Setting Sensing Sensitivity: 2 mV

## 2021-01-16 DIAGNOSIS — R040 Epistaxis: Secondary | ICD-10-CM | POA: Diagnosis not present

## 2021-01-16 NOTE — Telephone Encounter (Signed)
Spoke with pt and advised of recommendation to restart Eliquis.  Pt states she has already restarted medication. And will follow up with Dr Benjamine Mola.  Pt thanked Therapist, sports for the phone call.  Spoke to Dr Benjamine Mola and he thought reasonable to resume her anticoagulation   he is planning to see her in a few weeks  Thanks SK

## 2021-01-20 DIAGNOSIS — R040 Epistaxis: Secondary | ICD-10-CM | POA: Diagnosis not present

## 2021-01-21 NOTE — Progress Notes (Signed)
Remote pacemaker transmission.   

## 2021-01-26 DIAGNOSIS — R040 Epistaxis: Secondary | ICD-10-CM | POA: Diagnosis not present

## 2021-02-13 ENCOUNTER — Encounter: Payer: Self-pay | Admitting: Nurse Practitioner

## 2021-02-13 ENCOUNTER — Telehealth (INDEPENDENT_AMBULATORY_CARE_PROVIDER_SITE_OTHER): Payer: Medicare Other | Admitting: Nurse Practitioner

## 2021-02-13 VITALS — BP 133/66 | HR 92 | Temp 98.0°F | Ht 66.75 in | Wt 114.0 lb

## 2021-02-13 DIAGNOSIS — R051 Acute cough: Secondary | ICD-10-CM | POA: Diagnosis not present

## 2021-02-13 MED ORDER — BENZONATATE 100 MG PO CAPS: 100.0000 mg | ORAL_CAPSULE | Freq: Two times a day (BID) | ORAL | 0 refills | Status: DC | PRN

## 2021-02-13 MED ORDER — DOXYCYCLINE HYCLATE 100 MG PO TABS
100.0000 mg | ORAL_TABLET | Freq: Two times a day (BID) | ORAL | 0 refills | Status: DC
Start: 2021-02-13 — End: 2021-02-25

## 2021-02-13 NOTE — Progress Notes (Signed)
An audio-only tele-health visit was completed today for this patient. I connected with  Alyssa [name] on 02/13/21 utilizing audio-only technology and verified that I am speaking with the correct person using two identifiers. The patient was located at their home, and I was located at the office of [place] at [facility] during the encounter. I discussed the limitations of evaluation and management by telemedicine. The patient expressed understanding and agreed to proceed.    Subjective:  Patient ID: Alyssa [name], female    DOB: Aug 20, 1924  Age: 88 y.o. MRN: [id number]  CC:  Chief Complaint  Patient presents with   Cough      HPI  This patient arrives today for a virtual visit for the above.  Symptoms have been going on for approximately 13 days.  Main concern is productive cough, shortness of breath with coughing spells, and wheezing.  She also has been experiencing nasal congestion and fatigue.  She denies any fever or headache.  She also denies abdominal pain or diarrhea.  She has used a heating pad on her chest as well as Mucinex.  With mild to moderate improvement in her symptoms.  She also mentions she has a humidifier at home.  As far she knows she has not been in close contact with anybody sick.  She has been fully vaccinated against COVID-19.  Past Medical History:  Diagnosis Date   Atrial fibrillation ([place])    pacemaker, chronic anticoag   Breast cancer ([place])    Diverticulosis    Fibroma    inner lips/mouth   GERD (gastroesophageal reflux disease)    Hx of breast cancer    Hyperkalemia    Hypertension    Hypothyroidism    IBS (irritable bowel syndrome)    Internal hemorrhoids    Left inguinal hernia    direct   MVP (mitral valve prolapse)    Osteoporosis    Renal insufficiency    Thyroid cancer ([place])       Family History  Problem Relation Age of Onset   Colon cancer Father 4   Heart disease Father    Stroke Mother    Arthritis Other     Hypertension Other    Esophageal cancer Neg Hx    Pancreatic cancer Neg Hx    Kidney disease Neg Hx    Liver disease Neg Hx    Diabetes Neg Hx    Rectal cancer Neg Hx    Stomach cancer Neg Hx     Social History   Social History Narrative   Regular Exercise: Yes   Daily Caffeine Use: Sometimes.            Social History   Tobacco Use   Smoking status: Former    Types: Cigarettes    Quit date: 02/08/1954    Years since quitting: 67.0   Smokeless tobacco: Never  Substance Use Topics   Alcohol use: No    Alcohol/week: 0.0 standard drinks     Current Meds  Medication Sig   acetaminophen (TYLENOL) 650 MG CR tablet Take 1,300 mg by mouth 2 (two) times daily.   apixaban (ELIQUIS) 2.5 MG TABS tablet Take 1 tablet (2.5 mg total) by mouth 2 (two) times daily. Pt is overdue for follow-up, MUST see MD for FUTURE refills.   benzonatate (TESSALON) 100 MG capsule Take 1 capsule (100 mg total) by mouth 2 (two) times daily as needed for cough.   bifidobacterium infantis (ALIGN) capsule Take 2  capsules by mouth daily.   clonazePAM (KLONOPIN) 0.5 MG tablet Take 1 tablet (0.5 mg total) by mouth 2 (two) times daily as needed. for anxiety   doxycycline (VIBRA-TABS) 100 MG tablet Take 1 tablet (100 mg total) by mouth 2 (two) times daily.   fluconazole (DIFLUCAN) 200 MG tablet Take 200 mg by mouth once a week. For four months only   furosemide (LASIX) 40 MG tablet TAKE 1 TABLET BY MOUTH DAILY, MAY TAKE ADDITIONAL 1/2 TABLET AS NEEDED FOR WEIGHT GAIN   levothyroxine (SYNTHROID) 50 MCG tablet TAKE 1 TABLET(50 MCG) BY MOUTH DAILY BEFORE AND BREAKFAST   MISC NATURAL PRODUCTS EX Apply 1 application topically in the morning and at bedtime. CBD cream for arthritis pain   potassium chloride (KLOR-CON) 10 MEQ tablet TAKE 1 TABLET(10 MEQ) BY MOUTH DAILY    ROS:  Review of Systems  Constitutional:  Positive for malaise/fatigue. Negative for fever.  HENT:  Positive for congestion. Negative for sore  throat.   Respiratory:  Positive for cough, sputum production (yellow), shortness of breath (with coughing spells) and wheezing.   Cardiovascular:  Negative for chest pain.  Gastrointestinal:  Negative for abdominal pain and diarrhea.  Neurological:  Negative for headaches.    Objective:   Today's Vitals: BP 133/66    Pulse 92    Temp 98 F (36.7 C)    Ht 5' 6.75" (1.695 m)    Wt 114 lb (51.7 kg)    BMI 17.99 kg/m  Vitals with BMI 02/13/2021 11/25/2020 10/31/2020  Height 5' 6.75" 5' 6.75" 5' 6.75"  Weight 114 lbs 119 lbs 121 lbs  BMI 18 74.08 14.4  Systolic 818 563 149  Diastolic 66 80 82  Pulse 92 84 82     Physical Exam Comprehensive physical exam not completed today as office visit was conducted remotely.  Patient sounded fairly well over the phone, she did cough a few times but was able to speak in complete sentences without having to take a break to breathe.  Patient was alert and oriented, and appeared to have appropriate judgment.       Assessment and Plan   1. Acute cough      Plan: 1.  Based on patient's age and chronic conditions will treat her with antibiotics as her symptoms have been ongoing for almost 2 weeks.  Will prescribe doxycycline as well as Tessalon Perles for cough suppression.  She was told to monitor herself closely and if symptoms worsen in any way or are not improving despite these measures she needs to call our office for a in person appointment.  She tells me she understands.   Tests ordered No orders of the defined types were placed in this encounter.     Meds ordered this encounter  Medications   doxycycline (VIBRA-TABS) 100 MG tablet    Sig: Take 1 tablet (100 mg total) by mouth 2 (two) times daily.    Dispense:  20 tablet    Refill:  0    Order Specific Question:   Supervising Provider    Answer:   BURNS, Claudina Lick [[id number]]   benzonatate (TESSALON) 100 MG capsule    Sig: Take 1 capsule (100 mg total) by mouth 2 (two) times daily as  needed for cough.    Dispense:  20 capsule    Refill:  0    Order Specific Question:   Supervising Provider    Answer:   Binnie Rail [[id number]]    Patient to  follow-up later this month as scheduled or sooner as needed.  Total time spent on the telephone today was 24 minutes and 42 seconds.  Ailene Ards, NP

## 2021-02-16 ENCOUNTER — Telehealth: Payer: Self-pay | Admitting: Internal Medicine

## 2021-02-16 NOTE — Telephone Encounter (Signed)
Patient calling in  Patient says she spoke w/ Triage nurse yesterday & was told to be seen by provider within 24 hours  Patient says she is still experiencing cough & believes the cough med & antibiotic she was previously prescribed 01/06 is making her sick  She is requesting to be seen today as she can not keep waiting & prefers to see Dr. [name].. please see if patient can be placed on scheduled

## 2021-02-22 ENCOUNTER — Other Ambulatory Visit: Payer: Self-pay | Admitting: Internal Medicine

## 2021-02-22 DIAGNOSIS — E039 Hypothyroidism, unspecified: Secondary | ICD-10-CM

## 2021-02-23 DIAGNOSIS — R040 Epistaxis: Secondary | ICD-10-CM | POA: Diagnosis not present

## 2021-02-25 ENCOUNTER — Encounter: Payer: Self-pay | Admitting: Internal Medicine

## 2021-02-25 ENCOUNTER — Other Ambulatory Visit: Payer: Self-pay

## 2021-02-25 ENCOUNTER — Ambulatory Visit (INDEPENDENT_AMBULATORY_CARE_PROVIDER_SITE_OTHER): Payer: Medicare Other | Admitting: Internal Medicine

## 2021-02-25 ENCOUNTER — Ambulatory Visit (INDEPENDENT_AMBULATORY_CARE_PROVIDER_SITE_OTHER): Payer: Medicare Other

## 2021-02-25 VITALS — BP 126/80 | HR 88 | Temp 97.9°F | Ht 66.75 in | Wt 113.0 lb

## 2021-02-25 DIAGNOSIS — R052 Subacute cough: Secondary | ICD-10-CM

## 2021-02-25 DIAGNOSIS — Z0001 Encounter for general adult medical examination with abnormal findings: Secondary | ICD-10-CM | POA: Diagnosis not present

## 2021-02-25 DIAGNOSIS — J208 Acute bronchitis due to other specified organisms: Secondary | ICD-10-CM

## 2021-02-25 DIAGNOSIS — N1832 Chronic kidney disease, stage 3b: Secondary | ICD-10-CM | POA: Diagnosis not present

## 2021-02-25 DIAGNOSIS — D500 Iron deficiency anemia secondary to blood loss (chronic): Secondary | ICD-10-CM

## 2021-02-25 DIAGNOSIS — E039 Hypothyroidism, unspecified: Secondary | ICD-10-CM | POA: Diagnosis not present

## 2021-02-25 DIAGNOSIS — U071 COVID-19: Secondary | ICD-10-CM

## 2021-02-25 DIAGNOSIS — J4531 Mild persistent asthma with (acute) exacerbation: Secondary | ICD-10-CM

## 2021-02-25 LAB — CBC WITH DIFFERENTIAL/PLATELET
Basophils Absolute: 0 10*3/uL (ref 0.0–0.1)
Basophils Relative: 0.7 % (ref 0.0–3.0)
Eosinophils Absolute: 0.1 10*3/uL (ref 0.0–0.7)
Eosinophils Relative: 1.5 % (ref 0.0–5.0)
HCT: 38.9 % (ref 36.0–46.0)
Hemoglobin: 12.8 g/dL (ref 12.0–15.0)
Lymphocytes Relative: 28.2 % (ref 12.0–46.0)
Lymphs Abs: 1.4 10*3/uL (ref 0.7–4.0)
MCHC: 32.9 g/dL (ref 30.0–36.0)
MCV: 96 fl (ref 78.0–100.0)
Monocytes Absolute: 0.4 10*3/uL (ref 0.1–1.0)
Monocytes Relative: 8.6 % (ref 3.0–12.0)
Neutro Abs: 3.1 10*3/uL (ref 1.4–7.7)
Neutrophils Relative %: 61 % (ref 43.0–77.0)
Platelets: 203 10*3/uL (ref 150.0–400.0)
RBC: 4.06 Mil/uL (ref 3.87–5.11)
RDW: 12.7 % (ref 11.5–15.5)
WBC: 5 10*3/uL (ref 4.0–10.5)

## 2021-02-25 LAB — TSH: TSH: 7.26 u[IU]/mL — ABNORMAL HIGH (ref 0.35–5.50)

## 2021-02-25 LAB — SARS-COV-2 IGG: SARS-COV-2 IgG: 22.08

## 2021-02-25 NOTE — Progress Notes (Signed)
Subjective:  Patient ID: Alyssa [name], female    DOB: 21-Apr-1924  Age: 88 y.o. MRN: [id number]  CC: Annual Exam and Hypothyroidism  This visit occurred during the SARS-CoV-2 public health emergency.  Safety protocols were in place, including screening questions prior to the visit, additional usage of staff PPE, and extensive cleaning of exam room while observing appropriate contact time as indicated for disinfecting solutions.    HPI Alyssa [name] presents for a CPX.  She complains of a 2-week history of cough productive of clear phlegm with shortness of breath.  She has also had some weight loss.  She denies chest pain, hemoptysis, fever, chills, or night sweats.  Outpatient Medications Prior to Visit  Medication Sig Dispense Refill   acetaminophen (TYLENOL) 650 MG CR tablet Take 1,300 mg by mouth 2 (two) times daily.     apixaban (ELIQUIS) 2.5 MG TABS tablet Take 1 tablet (2.5 mg total) by mouth 2 (two) times daily. Pt is overdue for follow-up, MUST see MD for FUTURE refills. 180 tablet 0   bifidobacterium infantis (ALIGN) capsule Take 2 capsules by mouth daily.     clonazePAM (KLONOPIN) 0.5 MG tablet Take 1 tablet (0.5 mg total) by mouth 2 (two) times daily as needed. for anxiety 180 tablet 1   fluconazole (DIFLUCAN) 200 MG tablet Take 200 mg by mouth once a week. For four months only     furosemide (LASIX) 40 MG tablet TAKE 1 TABLET BY MOUTH DAILY, MAY TAKE ADDITIONAL 1/2 TABLET AS NEEDED FOR WEIGHT GAIN 90 tablet 3   levothyroxine (SYNTHROID) 50 MCG tablet TAKE 1 TABLET(50 MCG) BY MOUTH DAILY BEFORE BREAKFAST 90 tablet 0   MISC NATURAL PRODUCTS EX Apply 1 application topically in the morning and at bedtime. CBD cream for arthritis pain     potassium chloride (KLOR-CON) 10 MEQ tablet TAKE 1 TABLET(10 MEQ) BY MOUTH DAILY 90 tablet 0   benzonatate (TESSALON) 100 MG capsule Take 1 capsule (100 mg total) by mouth 2 (two) times daily as needed for cough. 20 capsule 0   doxycycline  (VIBRA-TABS) 100 MG tablet Take 1 tablet (100 mg total) by mouth 2 (two) times daily. 20 tablet 0   No facility-administered medications prior to visit.    ROS Review of Systems  Constitutional:  Positive for unexpected weight change (wt loss). Negative for diaphoresis, fatigue and fever.  HENT: Negative.    Respiratory:  Positive for cough. Negative for chest tightness, shortness of breath and wheezing.   Cardiovascular:  Negative for chest pain, palpitations and leg swelling.  Gastrointestinal:  Negative for abdominal pain, constipation, diarrhea, nausea and vomiting.  Endocrine: Negative.   Genitourinary: Negative.  Negative for difficulty urinating and dysuria.  Musculoskeletal: Negative.  Negative for arthralgias.  Skin: Negative.  Negative for color change and pallor.  Neurological:  Negative for dizziness, weakness and headaches.  Hematological:  Negative for adenopathy. Does not bruise/bleed easily.  Psychiatric/Behavioral: Negative.     Objective:  BP 126/80 (BP Location: Left Arm, Patient Position: Sitting, Cuff Size: Normal)    Pulse 88    Temp 97.9 F (36.6 C) (Oral)    Ht 5' 6.75" (1.695 m)    Wt 113 lb (51.3 kg)    SpO2 94%    BMI 17.83 kg/m   BP Readings from Last 3 Encounters:  02/25/21 126/80  02/13/21 133/66  11/25/20 116/80    Wt Readings from Last 3 Encounters:  02/25/21 113 lb (51.3 kg)  02/13/21 114 lb (  51.7 kg)  11/25/20 119 lb (54 kg)    Physical Exam Vitals reviewed.  Constitutional:      General: She is not in acute distress.    Appearance: She is not ill-appearing, toxic-appearing or diaphoretic.  HENT:     Nose: Nose normal.     Mouth/Throat:     Mouth: Mucous membranes are moist.  Eyes:     General: No scleral icterus.    Conjunctiva/sclera: Conjunctivae normal.  Cardiovascular:     Rate and Rhythm: Normal rate and regular rhythm.     Heart sounds: No murmur heard. Pulmonary:     Effort: Pulmonary effort is normal. No accessory muscle  usage or respiratory distress.     Breath sounds: No stridor. Examination of the right-middle field reveals wheezing. Wheezing present. No decreased breath sounds or rhonchi.  Abdominal:     General: Abdomen is flat.     Palpations: There is no mass.     Tenderness: There is no abdominal tenderness. There is no guarding.  Musculoskeletal:     Cervical back: Neck supple.  Lymphadenopathy:     Cervical: No cervical adenopathy.  Skin:    General: Skin is warm and dry.     Findings: No rash.  Neurological:     General: No focal deficit present.     Mental Status: She is alert.  Psychiatric:        Mood and Affect: Mood normal.        Behavior: Behavior normal.    Lab Results  Component Value Date   WBC 5.0 02/25/2021   HGB 12.8 02/25/2021   HCT 38.9 02/25/2021   PLT 203.0 02/25/2021   GLUCOSE 89 02/25/2021   CHOL 155 02/11/2014   TRIG 84.0 02/11/2014   HDL 46.10 02/11/2014   LDLCALC 92 02/11/2014   ALT 17 03/14/2020   AST 20 03/14/2020   NA 142 02/25/2021   K 4.5 02/25/2021   CL 101 02/25/2021   CREATININE 1.34 (H) 02/25/2021   BUN 33 (H) 02/25/2021   CO2 27 02/25/2021   TSH 7.26 (H) 02/25/2021   INR 1.3 (A) 03/17/2020   HGBA1C 5.7 06/12/2019    DG Chest 2 View  Result Date: 02/26/2021 CLINICAL DATA:  Cough for 3 weeks, no known injury, history atrial fibrillation, breast cancer, former smoker, hypertension, pacemaker EXAM: CHEST - 2 VIEW COMPARISON:  01/14/2020 FINDINGS: LEFT subclavian sequential transvenous pacemaker leads project at RIGHT atrium and RIGHT ventricle. Enlargement of cardiac silhouette. Mediastinal contours and pulmonary vascularity normal. Atherosclerotic calcification aorta. Emphysematous and bronchitic changes consistent with COPD. No acute infiltrate, pleural effusion, or pneumothorax. Bones demineralized with chronic compression deformity of a lower thoracic vertebra. BILATERAL breast prostheses. IMPRESSION: Enlargement of cardiac silhouette. COPD  changes without acute abnormalities. Aortic Atherosclerosis (ICD10-I70.0) and Emphysema (ICD10-J43.9). Electronically Signed   By: Lavonia Dana M.D.   On: 02/26/2021 10:53     Assessment & Plan:   Alyssa [name] was seen today for annual exam and hypothyroidism.  Diagnoses and all orders for this visit:  Acquired hypothyroidism- Her TSH is in the acceptable range.  Will continue the current dose of levothyroxine. -     TSH; Future -     TSH  Chronic renal impairment, stage 3b ([place])- Her renal function is stable. -     Basic metabolic panel; Future -     Basic metabolic panel  Iron deficiency anemia due to chronic blood loss- H&H and iron levels are normal now. -  CBC with Differential/Platelet; Future -     IBC + Ferritin; Future -     IBC + Ferritin -     CBC with Differential/Platelet  Encounter for general adult medical examination with abnormal findings- Exam completed, labs reviewed, vaccines are up-to-date, no cancer screenings indicated, patient education was given.  Subacute cough- Her chest x-ray is negative for mass or infiltrate.  Her COVID antibody is elevated. -     SARS-COV-2 IgG; Future -     DG Chest 2 View; Future -     SARS-COV-2 IgG  Acute bronchitis due to COVID-19 virus -     HYDROcodone bit-homatropine (HYCODAN) 5-1.5 MG/5ML syrup; Take 5 mLs by mouth every 8 (eight) hours as needed for up to 7 days for cough. -     methylPREDNISolone (MEDROL DOSEPAK) 4 MG TBPK tablet; TAKE AS DIRECTED  Mild persistent asthmatic bronchitis with acute exacerbation -     HYDROcodone bit-homatropine (HYCODAN) 5-1.5 MG/5ML syrup; Take 5 mLs by mouth every 8 (eight) hours as needed for up to 7 days for cough. -     methylPREDNISolone (MEDROL DOSEPAK) 4 MG TBPK tablet; TAKE AS DIRECTED   I have discontinued [facility] Cerise T. Howes's doxycycline and benzonatate. I am also having her start on HYDROcodone bit-homatropine and methylPREDNISolone. Additionally, I am having her maintain her acetaminophen,  MISC NATURAL PRODUCTS EX, bifidobacterium infantis, clonazePAM, fluconazole, apixaban, potassium chloride, furosemide, and levothyroxine.  Meds ordered this encounter  Medications   HYDROcodone bit-homatropine (HYCODAN) 5-1.5 MG/5ML syrup    Sig: Take 5 mLs by mouth every 8 (eight) hours as needed for up to 7 days for cough.    Dispense:  120 mL    Refill:  0   methylPREDNISolone (MEDROL DOSEPAK) 4 MG TBPK tablet    Sig: TAKE AS DIRECTED    Dispense:  21 tablet    Refill:  0     Follow-up: Return in about 6 months (around 08/25/2021).  [name], MD

## 2021-02-25 NOTE — Patient Instructions (Signed)

## 2021-02-26 DIAGNOSIS — U071 COVID-19: Secondary | ICD-10-CM | POA: Insufficient documentation

## 2021-02-26 LAB — IBC + FERRITIN
Ferritin: 250.3 ng/mL (ref 10.0–291.0)
Iron: 45 ug/dL (ref 42–145)
Saturation Ratios: 12.9 % — ABNORMAL LOW (ref 20.0–50.0)
TIBC: 348.6 ug/dL (ref 250.0–450.0)
Transferrin: 249 mg/dL (ref 212.0–360.0)

## 2021-02-26 LAB — BASIC METABOLIC PANEL
BUN: 33 mg/dL — ABNORMAL HIGH (ref 6–23)
CO2: 27 mEq/L (ref 19–32)
Calcium: 9.5 mg/dL (ref 8.4–10.5)
Chloride: 101 mEq/L (ref 96–112)
Creatinine, Ser: 1.34 mg/dL — ABNORMAL HIGH (ref 0.40–1.20)
GFR: 33.87 mL/min — ABNORMAL LOW (ref >60.00)
Glucose, Bld: 89 mg/dL (ref 70–99)
Potassium: 4.5 mEq/L (ref 3.5–5.1)
Sodium: 142 mEq/L (ref 135–145)

## 2021-02-27 ENCOUNTER — Telehealth: Payer: Medicare Other | Admitting: Nurse Practitioner

## 2021-02-27 ENCOUNTER — Telehealth: Payer: Self-pay

## 2021-02-27 ENCOUNTER — Other Ambulatory Visit: Payer: Self-pay | Admitting: Internal Medicine

## 2021-02-27 DIAGNOSIS — J208 Acute bronchitis due to other specified organisms: Secondary | ICD-10-CM

## 2021-02-27 DIAGNOSIS — J4531 Mild persistent asthma with (acute) exacerbation: Secondary | ICD-10-CM

## 2021-02-27 DIAGNOSIS — J45909 Unspecified asthma, uncomplicated: Secondary | ICD-10-CM | POA: Insufficient documentation

## 2021-02-27 DIAGNOSIS — U071 COVID-19: Secondary | ICD-10-CM

## 2021-02-27 MED ORDER — METHYLPREDNISOLONE 4 MG PO TBPK: ORAL_TABLET | ORAL | 0 refills | Status: AC

## 2021-02-27 MED ORDER — HYDROCODONE BIT-HOMATROP MBR 5-1.5 MG/5ML PO SOLN: 5.0000 mL | Freq: Three times a day (TID) | ORAL | 0 refills | Status: DC | PRN

## 2021-02-27 MED ORDER — HYDROCODONE BIT-HOMATROP MBR 5-1.5 MG/5ML PO SOLN: 5.0000 mL | Freq: Three times a day (TID) | ORAL | 0 refills | Status: AC | PRN

## 2021-02-27 NOTE — Telephone Encounter (Signed)
Per Nelva Bush instruction I have reached out to the pt in regard to the telephone appointment she has scheduled today 1/20 @ 4.40pm. Judson Roch noticed that PCP has addressed her concerns by sending in Rx to the pharmacy for cough syrup and a medrol dose pack.   I called the pt to inquire is she would like to keep the phone visit or to cancel. Pt said she would like to cancel the visit, which I have done, but she stated that she was "mad as hell" referring to PCP because he did not call her personally to inform her that she had COVID and she does not have a way to get to the pharmacy to pick up her medications.   She stated that she spoke to [place], CMA 20-60min ago and stated that CMA would pick up meds and bring to her home. Pt stated that she expect someone to get her meds and bring them to her. I recommended that she call a friend to do that for her. Pt stated that she wanted me to follow up with PCP.   After disconnecting the call with the pt I reached out to [facility] [place] about statement above. Verdis Frederickson said she informed the pt that she would reach out to the pharmacy to see if they had a delivery service that could provide her that help but stated that she informed the pt that they do not have that service.

## 2021-02-27 NOTE — Telephone Encounter (Signed)
Pt has stated she feels really bad and has a bad cough.  She is having a persistent productive cough and not feeling good at all. I have asked the pt has she taken a COVID test and she has stated she does not have COVID.

## 2021-03-06 ENCOUNTER — Other Ambulatory Visit: Payer: Self-pay

## 2021-03-06 MED ORDER — APIXABAN 2.5 MG PO TABS: 2.5000 mg | ORAL_TABLET | Freq: Two times a day (BID) | ORAL | 0 refills | Status: DC

## 2021-03-06 NOTE — Telephone Encounter (Signed)
Prescription refill request for Eliquis received. Indication: Afib  Last office visit:10/30/20 ([place])  Scr: 1.34 (02/25/21)  Age: 88 Weight: 51.3kg  Appropriate dose and refill sent to requested pharmacy.

## 2021-03-11 ENCOUNTER — Telehealth: Payer: Self-pay | Admitting: Internal Medicine

## 2021-03-11 NOTE — Telephone Encounter (Signed)
°  Pt c/o Shortness Of Breath: STAT if SOB developed within the last 24 hours or pt is noticeably SOB on the phone  1. Are you currently SOB (can you hear that pt is SOB on the phone)? No   2. How long have you been experiencing SOB? Been having SOB but she felt like its getting worst  3. Are you SOB when sitting or when up moving around? Moving around  4. Are you currently experiencing any other symptoms?   Pt is requesting to speak with Rosann Auerbach, she said, her SOB is getting worst and also need to discuss about her medications

## 2021-03-11 NOTE — Telephone Encounter (Signed)
Spent 15 min on phone with pt. She reports recent Covid infection in January, "which may account for the issues I have been having". She is continuing to experience sob and has been taking extra 1/2 Lasix for about a week now. Pt denies weight gain/swelling. Educated pt to Lasix Rx advised her to take for weight gain not sob. Educated to possible correlation to recent covid infection that she did not take prescribed medications for.  Advised to call back to PCP, whom she is not happy with, to further discuss ongoing/worsening sob.  Aware that cardiology will see her if they feel heart related symptom. Patient verbalized understanding and agreeable to plan.

## 2021-03-17 ENCOUNTER — Other Ambulatory Visit: Payer: Self-pay | Admitting: Internal Medicine

## 2021-03-17 DIAGNOSIS — I5032 Chronic diastolic (congestive) heart failure: Secondary | ICD-10-CM

## 2021-03-18 ENCOUNTER — Telehealth: Payer: Self-pay | Admitting: Internal Medicine

## 2021-03-18 NOTE — Telephone Encounter (Signed)
Pt states she had recent Covid infection in January, "which may be the reason for the sob issues I've been  having"  Pt is continuing to experience sob; according to pt she did not take HYDROcodone bit-homatropine (HYCODAN) 5-1.5 MG/5ML syrup prescribed on 1-20 until 1-31  Pt states cough medicine " helped tremendously"  Pt requesting a call back for providers recommendations regarding sob

## 2021-03-24 NOTE — Telephone Encounter (Signed)
Pt checking status of return call, pt states she is "very upset" because no one has returned her call  Pt states she never took HYDROcodone bit-homatropine (HYCODAN) 5-1.5 MG/5ML syrup  as stated in last phone call, pt states she took otc medication instead  Pt states she is still sob

## 2021-03-24 NOTE — Telephone Encounter (Signed)
Spoke to pt Scheduled OV for 2/16 with Dr. Quay Burow for follow up of continued Sx

## 2021-03-25 NOTE — Progress Notes (Signed)
Subjective:    Patient ID: Alyssa [name], female    DOB: 07-Mar-1924, 88 y.o.   MRN: [id number]  This visit occurred during the SARS-CoV-2 public health emergency.  Safety protocols were in place, including screening questions prior to the visit, additional usage of staff PPE, and extensive cleaning of exam room while observing appropriate contact time as indicated for disinfecting solutions.    HPI The patient is here for an acute visit.  Lingering SOB post covid - she had covid in January ( positive SARS-COV-2-IgG).    At home BP in Jan 138/69, 116/60, 102/54                     In Feb  176/90, 174/85, 184/99, 184/104, 184/109, 188/98-she was concerned because her BP has been so high and she is not sure why.  Difficulty breathing: Usually wakes up at 3 am and can not breath.  At 3 am she sits up in bed and puts a heating pad on her chest .  It lasts about 2-3 hrs.  At 5 am or so she can get her breath and can sleep for an 1-2 hours then gets up.  During day she is ok but at 6pm she starts to have breathing problems - difficulty getting deep breath.  It comes and goes for about 20 minutes - one hour.  She is ususally sitting down when it comes.  After she saw Dr. [name] at her last visit he had given her a few days of a steroid and she states that really helped and she felt well.  She really thinks this breathing problem started a couple of months ago.   Has occasional cough, which is not new.   Occasionally brings up sputum. More this morning.   Usually clear.  She thinks her cough may have gotten slightly worse recently.  She is bringing up more mucus.  She takes mucinex and does nasal saline spray.    Medications and allergies reviewed with patient and updated if appropriate.  Patient Active Problem List   Diagnosis Date Noted   Asthmatic bronchitis 02/27/2021   Acute bronchitis due to COVID-19 virus 02/26/2021   Encounter for general adult medical examination with abnormal  findings 02/25/2021   Subacute cough 02/25/2021   Iron deficiency anemia due to chronic blood loss 03/19/2020   Hypoproteinemia ([place]) 03/13/2020   Chronic right-sided low back pain without sciatica 05/09/2019   Fracture of vertebra due to osteoporosis ([place]) 12/28/2018   IBS (irritable bowel syndrome) 02/20/2018   Seasonal allergic rhinitis due to pollen 11/25/2016   (HFpEF) heart failure with preserved ejection fraction ([place]) 08/13/2016   GAD (generalized anxiety disorder) 02/18/2016   Essential hypertension, benign 09/04/2015   Severe tricuspid regurgitation 04/22/2015   Complete heart block ([place]) 02/26/2015   Chronic renal insufficiency, stage III (moderate) ([place]) 06/12/2012   Pacemaker-dual chamber  06/23/2010   Irritable bowel syndrome 12/01/2009   GERD 06/10/2009   Hypothyroidism 02/26/2008   OSTEOPOROSIS 02/26/2008    Current Outpatient Medications on File Prior to Visit  Medication Sig Dispense Refill   acetaminophen (TYLENOL) 650 MG CR tablet Take 1,300 mg by mouth 2 (two) times daily.     apixaban (ELIQUIS) 2.5 MG TABS tablet Take 1 tablet (2.5 mg total) by mouth 2 (two) times daily. Pt is overdue for follow-up, MUST see MD for FUTURE refills. 180 tablet 0   bifidobacterium infantis (ALIGN) capsule Take 2 capsules by mouth daily.  clonazePAM (KLONOPIN) 0.5 MG tablet Take 1 tablet (0.5 mg total) by mouth 2 (two) times daily as needed. for anxiety 180 tablet 1   fluconazole (DIFLUCAN) 200 MG tablet Take 200 mg by mouth once a week. For four months only     furosemide (LASIX) 40 MG tablet TAKE 1 TABLET BY MOUTH DAILY, MAY TAKE ADDITIONAL 1/2 TABLET AS NEEDED FOR WEIGHT GAIN 90 tablet 3   levothyroxine (SYNTHROID) 50 MCG tablet TAKE 1 TABLET(50 MCG) BY MOUTH DAILY BEFORE BREAKFAST 90 tablet 0   MISC NATURAL PRODUCTS EX Apply 1 application topically in the morning and at bedtime. CBD cream for arthritis pain     potassium chloride (KLOR-CON M) 10 MEQ tablet TAKE 1 TABLET(10 MEQ)  BY MOUTH DAILY 90 tablet 0   No current facility-administered medications on file prior to visit.    Past Medical History:  Diagnosis Date   Atrial fibrillation ([place])    pacemaker, chronic anticoag   Breast cancer ([place])    Diverticulosis    Fibroma    inner lips/mouth   GERD (gastroesophageal reflux disease)    Hx of breast cancer    Hyperkalemia    Hypertension    Hypothyroidism    IBS (irritable bowel syndrome)    Internal hemorrhoids    Left inguinal hernia    direct   MVP (mitral valve prolapse)    Osteoporosis    Renal insufficiency    Thyroid cancer [place])     Past Surgical History:  Procedure Laterality Date   COLONOSCOPY  10/28/2005   internal hemorrhoids, diverticulosis (same as in 2002 and random bxs negative then)   EP IMPLANTABLE DEVICE N/A 02/26/2015   Procedure: PPM Generator Changeout;  Surgeon: [name], MD;  Location: [facility];  Service: Cardiovascular;  Laterality: N/A;   MASTECTOMY  1982   Bilateral   PACEMAKER INSERTION     THYROIDECTOMY     TONSILLECTOMY     UPPER GASTROINTESTINAL ENDOSCOPY  09/06/2000   normal    Social History   Socioeconomic History   Marital status: Widowed    Spouse name: Not on file   Number of children: 3   Years of education: Not on file   Highest education level: Not on file  Occupational History   Occupation: Retired   Tobacco Use   Smoking status: Former    Types: Cigarettes    Quit date: 02/08/1954    Years since quitting: 67.1   Smokeless tobacco: Never  Vaping Use   Vaping Use: Never used  Substance and Sexual Activity   Alcohol use: No    Alcohol/week: 0.0 standard drinks   Drug use: No   Sexual activity: Not Currently  Other Topics Concern   Not on file  Social History Narrative   Regular Exercise: Yes   Daily Caffeine Use: Sometimes.            Social Determinants of Health   Financial Resource Strain: Not on file  Food Insecurity: Not on file  Transportation Needs: Not on  file  Physical Activity: Not on file  Stress: Not on file  Social Connections: Not on file    Family History  Problem Relation Age of Onset   Colon cancer Father 82   Heart disease Father    Stroke Mother    Arthritis Other    Hypertension Other    Esophageal cancer Neg Hx    Pancreatic cancer Neg Hx    Kidney disease Neg Hx  Liver disease Neg Hx    Diabetes Neg Hx    Rectal cancer Neg Hx    Stomach cancer Neg Hx     Review of Systems  Constitutional:  Negative for chills and fever.  HENT:  Positive for postnasal drip. Negative for congestion, ear pain and sore throat.   Respiratory:  Positive for cough (occasional - occ brings up sputum - yellow), chest tightness and shortness of breath (if she does too much activity). Negative for wheezing.   Cardiovascular:  Positive for palpitations (chronic, intermittent). Negative for chest pain.  Neurological:  Positive for light-headedness (yesterday).      Objective:   Vitals:   03/26/21 0935  BP: 124/78  Pulse: 83  Temp: 98 F (36.7 C)  SpO2: 99%   BP Readings from Last 3 Encounters:  03/26/21 124/78  02/25/21 126/80  02/13/21 133/66   Wt Readings from Last 3 Encounters:  03/26/21 113 lb (51.3 kg)  02/25/21 113 lb (51.3 kg)  02/13/21 114 lb (51.7 kg)   Body mass index is 17.83 kg/m.   Physical Exam Constitutional:      General: She is not in acute distress.    Appearance: Normal appearance.  HENT:     Head: Normocephalic and atraumatic.  Eyes:     Conjunctiva/sclera: Conjunctivae normal.  Cardiovascular:     Rate and Rhythm: Normal rate and regular rhythm.     Heart sounds: Normal heart sounds. No murmur heard. Pulmonary:     Effort: Pulmonary effort is normal. No respiratory distress.     Breath sounds: Normal breath sounds. No wheezing.  Musculoskeletal:     Cervical back: Neck supple.     Right lower leg: No edema.     Left lower leg: No edema.  Lymphadenopathy:     Cervical: No cervical  adenopathy.  Skin:    Findings: No rash.  Neurological:     Mental Status: She is alert. Mental status is at baseline.  Psychiatric:        Mood and Affect: Mood normal.        Behavior: Behavior normal.           Assessment & Plan:    Sinusitis: Subacute She has had some increased sinus drainage and intermittent coughing because of it This has gotten worse recently Trial of prednisone 20 mg daily x5 days  Episodic difficulty breathing: This is a late August and is occurring approximately twice a day and seems to occur at the same time every day ?  Related to the drainage triggering it ?  Related to anxiety Does not happen when she is active or during the day so less likely cardiac or pulmonary in nature We will see how she responds to the prednisone  Hypertension: Blood pressure recently very high at home for no apparent reason Blood pressure perfect here today Advised her to bring her cuff in some time to have it checked ?  Anxiety She will continue to monitor and come in for nurse visit and bring her cough Continue Lasix 40 mg daily

## 2021-03-26 ENCOUNTER — Encounter: Payer: Self-pay | Admitting: Internal Medicine

## 2021-03-26 ENCOUNTER — Ambulatory Visit (INDEPENDENT_AMBULATORY_CARE_PROVIDER_SITE_OTHER): Payer: Medicare Other | Admitting: Internal Medicine

## 2021-03-26 ENCOUNTER — Other Ambulatory Visit: Payer: Self-pay

## 2021-03-26 VITALS — BP 124/78 | HR 83 | Temp 98.0°F | Ht 66.75 in | Wt 113.0 lb

## 2021-03-26 DIAGNOSIS — J0100 Acute maxillary sinusitis, unspecified: Secondary | ICD-10-CM

## 2021-03-26 DIAGNOSIS — R0602 Shortness of breath: Secondary | ICD-10-CM | POA: Diagnosis not present

## 2021-03-26 MED ORDER — PREDNISONE 20 MG PO TABS: 20.0000 mg | ORAL_TABLET | Freq: Every day | ORAL | 0 refills | Status: DC

## 2021-03-26 NOTE — Patient Instructions (Addendum)
° ° ° °  Medications changes include :   prednisone 20 mg daily x 5 days     Your prescription(s) have been sent to your pharmacy.

## 2021-03-28 ENCOUNTER — Other Ambulatory Visit: Payer: Self-pay

## 2021-03-28 ENCOUNTER — Emergency Department (HOSPITAL_COMMUNITY): Payer: Medicare Other

## 2021-03-28 ENCOUNTER — Encounter (HOSPITAL_COMMUNITY): Payer: Self-pay

## 2021-03-28 ENCOUNTER — Emergency Department (HOSPITAL_COMMUNITY)
Admission: EM | Admit: 2021-03-28 | Discharge: 2021-03-28 | Disposition: A | Discharge status: Home / Self Care | Payer: Medicare Other | Attending: Emergency Medicine | Admitting: Emergency Medicine

## 2021-03-28 DIAGNOSIS — I517 Cardiomegaly: Secondary | ICD-10-CM | POA: Diagnosis not present

## 2021-03-28 DIAGNOSIS — R0601 Orthopnea: Secondary | ICD-10-CM | POA: Diagnosis not present

## 2021-03-28 DIAGNOSIS — R079 Chest pain, unspecified: Secondary | ICD-10-CM | POA: Diagnosis not present

## 2021-03-28 DIAGNOSIS — R0602 Shortness of breath: Secondary | ICD-10-CM | POA: Diagnosis not present

## 2021-03-28 DIAGNOSIS — R0902 Hypoxemia: Secondary | ICD-10-CM | POA: Insufficient documentation

## 2021-03-28 DIAGNOSIS — N189 Chronic kidney disease, unspecified: Secondary | ICD-10-CM | POA: Diagnosis not present

## 2021-03-28 DIAGNOSIS — Z8616 Personal history of COVID-19: Secondary | ICD-10-CM | POA: Insufficient documentation

## 2021-03-28 LAB — CBC
HCT: 38.8 % (ref 36.0–46.0)
Hemoglobin: 12.5 g/dL (ref 12.0–15.0)
MCH: 31.4 pg (ref 26.0–34.0)
MCHC: 32.2 g/dL (ref 30.0–36.0)
MCV: 97.5 fL (ref 80.0–100.0)
Platelets: 189 10*3/uL (ref 150–400)
RBC: 3.98 MIL/uL (ref 3.87–5.11)
RDW: 12.1 % (ref 11.5–15.5)
WBC: 5.3 10*3/uL (ref 4.0–10.5)
nRBC: 0 % (ref 0.0–0.2)

## 2021-03-28 LAB — BASIC METABOLIC PANEL
Anion gap: 8 (ref 5–15)
BUN: 33 mg/dL — ABNORMAL HIGH (ref 8–23)
CO2: 26 mmol/L (ref 22–32)
Calcium: 8.8 mg/dL — ABNORMAL LOW (ref 8.9–10.3)
Chloride: 105 mmol/L (ref 98–111)
Creatinine, Ser: 1.24 mg/dL — ABNORMAL HIGH (ref 0.44–1.00)
GFR, Estimated: 40 mL/min — ABNORMAL LOW (ref >60)
Glucose, Bld: 130 mg/dL — ABNORMAL HIGH (ref 70–99)
Potassium: 4.2 mmol/L (ref 3.5–5.1)
Sodium: 139 mmol/L (ref 135–145)

## 2021-03-28 LAB — TROPONIN I (HIGH SENSITIVITY): Troponin I (High Sensitivity): 14 ng/L (ref <18)

## 2021-03-28 LAB — BRAIN NATRIURETIC PEPTIDE: B Natriuretic Peptide: 217.3 pg/mL — ABNORMAL HIGH (ref 0.0–100.0)

## 2021-03-28 LAB — D-DIMER, QUANTITATIVE: D-Dimer, Quant: 0.31 ug/mL-FEU (ref 0.00–0.50)

## 2021-03-28 NOTE — ED Notes (Signed)
Pt ambulated down the hall without assistance. O2 saturation stayed between 87% and 90% while ambulating with slightly heavier breathing.

## 2021-03-28 NOTE — Discharge Instructions (Addendum)
You were seen in the ER about shortness of breath.  Our work-up does not reveal any clear evidence of severe heart failure, blood clots in the lungs or anemia.  Bleeding and might be a good idea to follow-up with pulmonologist.  Please call the number provided on Monday.  I will also send a message to your primary care doctor about this visit, so that they can coordinate appropriate care.  Return to the ER if you start having worsening symptoms.

## 2021-03-28 NOTE — ED Provider Notes (Signed)
[facility] DEPT Provider Note   CSN: [id number] Arrival date & time: 03/28/21  1550     History  Chief Complaint  Patient presents with   Shortness of Breath   Chest Pain    Alyssa [name] is a 88 y.o. female.  HPI     88 year old female comes in with chief complaint of shortness of breath and chest pain.  She has history of severe tricuspid regurgitation, general anxiety disorder, CKD.  She indicates that she has been having intermittent episodes of shortness of breath and chest pain over the last couple of months.  The episodes typically occur at nighttime.  Between 3 and 5 she is often woken up with shortness of breath and at that time she has some chest discomfort.  Symptoms will persist for several minutes before they resolve.  Chest pain is described as pressure type pain.  No known history of coronary artery disease.  She denies any new cough.  Denies any leg swelling or weight gain.  She has seen her cardiologist about this complaint, but they did not think that the primary reason was her heart.  Patient's PCP put her on prednisone, but it has not helped.  In the past prednisone did help but, but she was sick with COVID-19 as well.  Patient allegedly had COVID-19 around Christmas time.  Symptoms however had begun even before the COVID-19 infection.  Patient denies any exertional shortness of breath or chest pain.  Indicates that during the daytime she does not typically get the symptoms.  Home Medications Prior to Admission medications   Medication Sig Start Date End Date Taking? Authorizing Provider  acetaminophen (TYLENOL) 650 MG CR tablet Take 1,300 mg by mouth 2 (two) times daily.   Yes [provider]  apixaban (ELIQUIS) 2.5 MG TABS tablet Take 1 tablet (2.5 mg total) by mouth 2 (two) times daily. Pt is overdue for follow-up, MUST see MD for FUTURE refills. 03/06/21  Yes [name], MD  clonazePAM (KLONOPIN) 0.5 MG tablet  Take 1 tablet (0.5 mg total) by mouth 2 (two) times daily as needed. for anxiety 07/14/20  Yes [name], MD  furosemide (LASIX) 40 MG tablet TAKE 1 TABLET BY MOUTH DAILY, MAY TAKE ADDITIONAL 1/2 TABLET AS NEEDED FOR WEIGHT GAIN Patient taking differently: Take 40 mg by mouth daily. May take additional 1/2 tablet as needed for weight gain 12/30/20  Yes [name], MD  Homeopathic Products ([employer] EAR PAIN RELIEF OT) Place 3 drops into the left ear as needed (Pain).   Yes [provider]  levothyroxine (SYNTHROID) 50 MCG tablet TAKE 1 TABLET(50 MCG) BY MOUTH DAILY BEFORE BREAKFAST Patient taking differently: Take 50 mcg by mouth daily before breakfast. 02/22/21  Yes [name], MD  MISC NATURAL PRODUCTS EX Apply 1 application topically in the morning and at bedtime. CBD cream for back and ankle pain   Yes [provider]  potassium chloride (KLOR-CON M) 10 MEQ tablet TAKE 1 TABLET(10 MEQ) BY MOUTH DAILY Patient taking differently: Take 10 mEq by mouth daily. 03/17/21  Yes [name], MD  predniSONE (DELTASONE) 20 MG tablet Take 1 tablet (20 mg total) by mouth daily with breakfast. 03/26/21  Yes Burns, Claudina Lick, MD  Propylene Glycol (SYSTANE COMPLETE OP) Apply 1 drop to eye as needed (dryness).   Yes [provider]  sodium chloride (OCEAN) 0.65 % SOLN nasal spray Place 1 spray into both nostrils as needed for  congestion.   Yes [provider]  bifidobacterium infantis (ALIGN) capsule Take 2 capsules by mouth daily. Patient not taking: Reported on 03/28/2021    [provider]  fluconazole (DIFLUCAN) 200 MG tablet Take 200 mg by mouth once a week. For four months only Patient not taking: Reported on 03/28/2021 10/21/20   [provider]      Allergies    Patient has no known allergies.    Review of Systems   Review of Systems  Constitutional:  Positive for activity change.  Respiratory:  Positive for shortness of breath.    Cardiovascular:  Positive for chest pain.  All other systems reviewed and are negative.  Physical Exam Updated Vital Signs BP (!) 152/75    Pulse 60    Temp 98.7 F (37.1 C) (Oral)    Resp 17    SpO2 92%  Physical Exam Vitals and nursing note reviewed.  Constitutional:      Appearance: She is well-developed.  HENT:     Head: Atraumatic.  Neck:     Vascular: No JVD.  Cardiovascular:     Rate and Rhythm: Normal rate.  Pulmonary:     Effort: Pulmonary effort is normal.     Breath sounds: No wheezing or rales.  Musculoskeletal:     Cervical back: Normal range of motion and neck supple.     Right lower leg: No edema.     Left lower leg: No edema.  Skin:    General: Skin is warm and dry.  Neurological:     Mental Status: She is alert and oriented to person, place, and time.    ED Results / Procedures / Treatments   Labs (all labs ordered are listed, but only abnormal results are displayed) Labs Reviewed  BASIC METABOLIC PANEL - Abnormal; Notable for the following components:      Result Value   Glucose, Bld 130 (*)    BUN 33 (*)    Creatinine, Ser 1.24 (*)    Calcium 8.8 (*)    GFR, Estimated 40 (*)    All other components within normal limits  BRAIN NATRIURETIC PEPTIDE - Abnormal; Notable for the following components:   B Natriuretic Peptide 217.3 (*)    All other components within normal limits  CBC  D-DIMER, QUANTITATIVE  TROPONIN I (HIGH SENSITIVITY)  TROPONIN I (HIGH SENSITIVITY)    EKG EKG Interpretation  Date/Time:  Saturday March 28 2021 16:00:25 EST Ventricular Rate:  68 PR Interval:    QRS Duration: 163 QT Interval:  456 QTC Calculation: 485 R Axis:   -86 Text Interpretation: Junctional rhythm RBBB and LAFB LVH with secondary repolarization abnormality Inferior infarct, acute (RCA) Lateral leads are also involved Probable RV involvement, suggest recording right precordial leads Nonspecific ST abnormality - MORE PRONOUNCED IN V5-V6 Confirmed by  Varney Biles [phone]) on 03/28/2021 5:04:37 PM  Radiology DG Chest 2 View  Result Date: 03/28/2021 CLINICAL DATA:  Intermittent shortness of breath and chest pain over the last month. EXAM: CHEST - 2 VIEW COMPARISON:  02/25/2021 FINDINGS: Chronic cardiomegaly. Dual lead pacemaker as seen previously. Aortic atherosclerotic calcification. There is chronic pulmonary hyperinflation with interstitial markings but no sign of active infiltrate, mass, effusion or collapse. Surgical clips are present in the right axilla. There is an old appearing superior endplate fracture in the lower thoracic spine, probably T12. IMPRESSION: No acute radiographic finding. Chronic cardiomegaly. Pacemaker. Aortic atherosclerosis. Chronic interstitial lung markings. Electronically Signed   By: Jan Fireman.D.  On: 03/28/2021 16:48    Procedures Procedures    Medications Ordered in ED Medications - No data to display  ED Course/ Medical Decision Making/ A&P Clinical Course as of 03/28/21 2200  Sat Mar 28, 2021  2148 B Natriuretic Peptide(!): 217.3 [MH]    Clinical Course User Index [MH] Lawerance Bach                           Medical Decision Making Amount and/or Complexity of Data Reviewed Labs: ordered. Radiology: ordered.   This patient presents to the ED with chief complaint(s) of shortness of breath - more consistent with orthopnea that is recurring every night with associated chest with pertinent past medical history of severe tricuspid valve regurgitation, CKD which further complicates the presenting complaint. The complaint involves an extensive differential diagnosis and treatment options and also carries with it a high risk of complications and morbidity.    The differential diagnosis includes : CHF due to her valvular disorder, pleural effusion, PE, general anxiety disorder, ACS, Anemia  Symptoms are not provoked by exertion. Upon ambulation - pt's O2 sats dropped to 88% - but she  had no chest pain or severe shob.  The initial plan is to order trops, dimer, bnp, cxr and to treat patient .   Additional history obtained: Records reviewed previous admission documents and Primary Care Documents  Reassessment and review (also see workup area): Lab Tests: I Ordered, and personally interpreted labs.  The pertinent results include:  slightly elevated BNP and negative dimer.  PT's troponin was not processed by lab - pt doesn't want to stay longer and my clinical suspicion for ACS is quite low.  Imaging Studies: I ordered and independently visualized and interpreted the following imaging CXR  which showed - no evidence of pulm edema. The interpretation of the imaging was limited to assessing for emergent pathology, for which purpose it was ordered.   Cardiac Monitoring: The patient was maintained on a cardiac monitor.  I personally viewed and interpreted the cardiac monitor which showed an underlying rhythm of:  sinus rhythm  Complexity of problems addressed: Patients presentation is most consistent with  acute illness/injury with systemic symptoms During patient's assessment  Disposition: After consideration of the diagnostic results and the patients response to treatment,  I feel that the patent would benefit from discharge . Marland Kitchen   PT given the option for admission - but she declined. I too think IP stay would not be very helpful.  She is otherwise stable, comfortable with outpatient follow-up.  I think pulmonary follow-up to see if she would benefit from sleep study order closer look at her lung parenchyma or pulmonary function test could be helpful.  Additionally, I did send a message to her PCP.  Might benefit with a repeat echocardiogram given that she has known history of severe tricuspid regurgitation.  Strict ER return precautions have been discussed, and patient is agreeing with the plan and is comfortable with the workup done and the recommendations from the  ER.   Final Clinical Impression(s) / ED Diagnoses Final diagnoses:  Orthopnea  Hypoxia    Rx / DC Orders ED Discharge Orders     None         Varney Biles, MD 03/28/21 2210

## 2021-03-28 NOTE — ED Triage Notes (Signed)
Pt reports intermittent SHOB and chest pain over the past month. Pt reports increasing SHOB today and palpitations. Pt has a pacemaker.

## 2021-03-29 ENCOUNTER — Telehealth: Payer: Self-pay | Admitting: Internal Medicine

## 2021-03-29 DIAGNOSIS — R0902 Hypoxemia: Secondary | ICD-10-CM

## 2021-03-29 DIAGNOSIS — R0602 Shortness of breath: Secondary | ICD-10-CM

## 2021-03-29 NOTE — Telephone Encounter (Signed)
Please call her - I know she went to the ED after seeing me.  Her oxygen level was a little low there. I have referred her to a lung doctor to be evaluated.    She should also follow up with her cardiologist

## 2021-03-30 ENCOUNTER — Ambulatory Visit: Payer: Medicare Other | Admitting: Internal Medicine

## 2021-03-30 ENCOUNTER — Encounter: Payer: Self-pay | Admitting: Internal Medicine

## 2021-03-30 ENCOUNTER — Other Ambulatory Visit: Payer: Self-pay

## 2021-03-30 VITALS — BP 120/60 | HR 103 | Temp 97.8°F | Ht 66.5 in | Wt 113.8 lb

## 2021-03-30 DIAGNOSIS — R0602 Shortness of breath: Secondary | ICD-10-CM | POA: Diagnosis not present

## 2021-03-30 MED ORDER — ALBUTEROL SULFATE HFA 108 (90 BASE) MCG/ACT IN AERS: 2.0000 | INHALATION_SPRAY | Freq: Four times a day (QID) | RESPIRATORY_TRACT | 5 refills | Status: DC | PRN

## 2021-03-30 NOTE — Telephone Encounter (Signed)
Spoke with patient today. 

## 2021-03-30 NOTE — Progress Notes (Signed)
Alyssa [name]    [id number]    08-Sep-1924  Primary Care Physician:Jones, Arvid Right, MD  Referring Physician: [name], MD [address] [place],  [place] [address] Reason for Consultation: shortness of breath Date of Consultation: 03/30/2021  Chief complaint:   Chief Complaint  Patient presents with   Consult    Sob and sats down to 87% (in the ER Saturday).  Was sick in December.     HPI: Alyssa [name] is a 88 y.o. woman with history of heart failure and tricuspid regurgitation on lasix.   Had asthma in college.   Has had shortness of breath since the past 6 months but has been progressing. Occasional wheezing.   She had a URI in end of December and January treated with doxycycline and tessalon perles. She had a cough then. She is on a prednisone taper right now which is her second one. Which has improved her cough.   At 3am like clockwork she wakes up because she can't breath and sits up.   She is not currently on inhaler therapy.   Pacemaker for atrial fib queried in Dec 2022  Device remote reviewed. Remote is normal.  Battery status is good.  Lead measurements unchanged.  Histograms are appropriate.   Social history:  Exposures: lives at home independently Smoking history: mild smoking history. Smoked 1/2 ppd for 10 years. Husband smoked, died at 27 from lung cancer, her son died in 18-Jun-2022 from bile duct cancer. Another son has lung cancer and he smoked.   Social History   Occupational History   Occupation: Retired   Tobacco Use   Smoking status: Former    Types: Cigarettes    Quit date: 02/08/1954    Years since quitting: 55.1   Smokeless tobacco: Never  Vaping Use   Vaping Use: Never used  Substance and Sexual Activity   Alcohol use: No    Alcohol/week: 0.0 standard drinks   Drug use: No   Sexual activity: Not Currently    Relevant family history:  Family History  Problem Relation Age of Onset   Colon cancer Father 26   Heart disease  Father    Stroke Mother    Arthritis Other    Hypertension Other    Esophageal cancer Neg Hx    Pancreatic cancer Neg Hx    Kidney disease Neg Hx    Liver disease Neg Hx    Diabetes Neg Hx    Rectal cancer Neg Hx    Stomach cancer Neg Hx     Past Medical History:  Diagnosis Date   Atrial fibrillation ([place])    pacemaker, chronic anticoag   Breast cancer ([place])    Diverticulosis    Fibroma    inner lips/mouth   GERD (gastroesophageal reflux disease)    Hx of breast cancer    Hyperkalemia    Hypertension    Hypothyroidism    IBS (irritable bowel syndrome)    Internal hemorrhoids    Left inguinal hernia    direct   MVP (mitral valve prolapse)    Osteoporosis    Renal insufficiency    Thyroid cancer [facility])     Past Surgical History:  Procedure Laterality Date   COLONOSCOPY  10/28/2005   internal hemorrhoids, diverticulosis (same as in 2002 and random bxs negative then)   EP IMPLANTABLE DEVICE N/A 02/26/2015   Procedure: PPM Generator Changeout;  Surgeon: [name], MD;  Location:  [facility];  Service: Cardiovascular;  Laterality: N/A;   MASTECTOMY  1982   Bilateral   PACEMAKER INSERTION     THYROIDECTOMY     TONSILLECTOMY     UPPER GASTROINTESTINAL ENDOSCOPY  09/06/2000   normal     Physical Exam: Blood pressure 120/60, pulse (!) 103, temperature 97.8 F (36.6 C), temperature source Oral, height 5' 6.5" (1.689 m), weight 113 lb 12.8 oz (51.6 kg), SpO2 95 %. Gen:      No acute distress, thin, well preserved ENT:  no nasal polyps, mucus membranes moist Lungs:    Diminished No increased respiratory effort, symmetric chest wall excursion, clear to auscultation bilaterally, no wheezes or crackles CV:         Regular rate and rhythm; no murmurs, rubs, or gallops.  No pedal edema Abd:      + bowel sounds; soft, non-tender; no distension MSK: no acute synovitis of DIP or PIP joints, no mechanics hands.  Skin:      Warm and dry; no rashes Neuro: normal speech,  no focal facial asymmetry Psych: alert and oriented x3, normal mood and affect   Data Reviewed/Medical Decision Making:  Independent interpretation of tests: Imaging:  Review of patient's chest xray Feb 2023 images revealed hyperinflation and cardiomegaly. The patient's images have been independently reviewed by me.    Echocardiogram May 2021   1. Apical hypokinesis (possibly related to pacing); overall low normal LV  systolic function; trace AI; mild MR; severe biatrial enlargement;  moderate to severe TR; mild RVE; small pericardial effusion.   2. Left ventricular ejection fraction, by estimation, is 50 to 55%. The  left ventricle has low normal function. The left ventricle demonstrates  regional wall motion abnormalities (see scoring diagram/findings for  description). Left ventricular diastolic   parameters are indeterminate.   3. Right ventricular systolic function is normal. The right ventricular  size is mildly enlarged. There is normal pulmonary artery systolic  pressure.   4. Left atrial size was severely dilated.   5. Right atrial size was severely dilated.   6. The mitral valve is normal in structure. Mild mitral valve  regurgitation. No evidence of mitral stenosis.   7. Tricuspid valve regurgitation is moderate to severe.   8. The aortic valve is tricuspid. Aortic valve regurgitation is trivial.  No aortic stenosis is present.   9. The inferior vena cava is normal in size with greater than 50%  respiratory variability, suggesting right atrial pressure of 3 mmHg.   PFTs:  No flowsheet data found.  Labs:  Lab Results  Component Value Date   WBC 5.3 03/28/2021   HGB 12.5 03/28/2021   HCT 38.8 03/28/2021   MCV 97.5 03/28/2021   PLT 189 03/28/2021   Lab Results  Component Value Date   NA 139 03/28/2021   K 4.2 03/28/2021   CL 105 03/28/2021   CO2 26 03/28/2021     Immunization status:  Immunization History  Administered Date(s) Administered   Fluad  Quad(high Dose 65+) 10/19/2018, 11/13/2019, 11/25/2020   Influenza Split 11/27/2010, 12/23/2011   Influenza Whole 12/13/2008, 12/01/2009   Influenza, High Dose Seasonal PF 12/12/2012, 11/05/2015, 11/25/2016, 10/27/2017   Influenza,inj,Quad PF,6+ Mos 11/29/2013, 11/18/2014   PFIZER(Purple Top)SARS-COV-2 Vaccination 02/27/2019, 03/20/2019, 11/27/2019   Pneumococcal Conjugate-13 02/11/2014   Pneumococcal Polysaccharide-23 04/23/2010, 06/12/2019   Td 04/23/2010   Tdap 02/12/2018   Zoster, Live 08/24/2012     I reviewed prior external note(s) from ED  I reviewed the  result(s) of the labs and imaging as noted above.   I have ordered Chest xray   Assessment:  Shortness of breath Possible Asthma Cardiomegaly with possible pericardial effusion  Plan/Recommendations: Dyspnea suspicious for asthma  Will do a trial of albuterol prn obtain a full set of PFTs Will obtain echo to evaluate the heart Nocturnal symptoms are concerning would investigate sleep disorder if cardiopulmonary workup unremarkable.  She was reportedly 87% in the ED last week. We did an ambulatory desat study which showed  desaturation to 93% without hypoxemia.   We discussed disease management and progression at length today.   Return to Care: Return in about 4 weeks (around 04/27/2021).  Lenice Llamas, MD Pulmonary and [place]  CC: [name], MD

## 2021-03-30 NOTE — Patient Instructions (Addendum)
Please schedule follow up scheduled with myself in 1 months.  If my schedule is not open yet, we will contact you with a reminder closer to that time. Please call [phone] if you haven't heard from [place] a month before.   Before your next visit I would like you to have: Full set of PFTs Echocardiogram - we will call you to schedule  Take the albuterol rescue inhaler every 4 to 6 hours as needed for wheezing or shortness of breath. You can also take it 15 minutes before exercise or exertional activity. Side effects include heart racing or pounding, jitters or anxiety. If you have a history of an irregular heart rhythm, it can make this worse. Can also give some patients a hard time sleeping.  To inhale the aerosol using an inhaler, follow these steps:  Remove the protective dust cap from the end of the mouthpiece. If the dust cap was not placed on the mouthpiece, check the mouthpiece for dirt or other objects. Be sure that the canister is fully and firmly inserted in the mouthpiece. 2. If you are using the inhaler for the first time or if you have not used the inhaler in more than 14 days, you will need to prime it. You may also need to prime the inhaler if it has been dropped. Ask your pharmacist or check the manufacturer's information if this happens. To prime the inhaler, shake it well and then press down on the canister 4 times to release 4 sprays into the air, away from your face. Be careful not to get albuterol in your eyes. 3. Shake the inhaler well. 4. Breathe out as completely as possible through your mouth. 4. Hold the canister with the mouthpiece on the bottom, facing you and the canister pointing upward. Place the open end of the mouthpiece into your mouth. Close your lips tightly around the mouthpiece. 6. Breathe in slowly and deeply through the mouthpiece.At the same time, press down once on the container to spray the medication into your mouth. 7. Try to hold your breath for 10  seconds. remove the inhaler, and breathe out slowly. 8. If you were told to use 2 puffs, wait 1 minute and then repeat steps 3-7. 9. Replace the protective cap on the inhaler. 10. Clean your inhaler regularly. Follow the manufacturer's directions carefully and ask your doctor or pharmacist if you have any questions about cleaning your inhaler.  Check the back of the inhaler to keep track of the total number of doses left on the inhaler.

## 2021-03-30 NOTE — Telephone Encounter (Signed)
Connected to [facility] 2.18.2023.  Caller was in the office this week. The caller states she was prescribed Prednisone. Caller is having difficulty breathing. After taking Prednisone, caller is having breathing issues, is more tired and is asking if she should continue to take Prednisone for 3 more pills.  Advised to go to ED.

## 2021-04-03 ENCOUNTER — Telehealth: Payer: Self-pay | Admitting: Internal Medicine

## 2021-04-03 NOTE — Telephone Encounter (Signed)
Called and spoke with patient. She has been using the albuterol inhaler since her OV with ND on 03/30/21. When she first used it, she stated that she felt great. On average, she had been using it 3 times per a day with 2 puffs. Yesterday morning she felt like she didn't get enough of the medication so she decided to do 3 puffs. She began to feel jittery and lightheaded.   I explained to her that it is best for her to only use the inhaler as needed for any wheezing, coughing or SOB. Also reminded her to only use the prescribed amount (1-2 puffs) and to never use 3 puffs. She is aware to try to space out the puffs every 6 hours.   She verbalized understanding and is aware to call us back if anything changes.   Nothing further needed at time of call.

## 2021-04-06 ENCOUNTER — Other Ambulatory Visit: Payer: Self-pay

## 2021-04-06 ENCOUNTER — Ambulatory Visit (HOSPITAL_COMMUNITY)
Admission: RE | Admit: 2021-04-06 | Discharge: 2021-04-06 | Disposition: A | Discharge status: Home / Self Care | Payer: Medicare Other | Source: Ambulatory Visit | Attending: Internal Medicine | Admitting: Internal Medicine

## 2021-04-06 DIAGNOSIS — R0602 Shortness of breath: Secondary | ICD-10-CM | POA: Insufficient documentation

## 2021-04-06 DIAGNOSIS — I10 Essential (primary) hypertension: Secondary | ICD-10-CM | POA: Insufficient documentation

## 2021-04-06 DIAGNOSIS — I081 Rheumatic disorders of both mitral and tricuspid valves: Secondary | ICD-10-CM | POA: Diagnosis not present

## 2021-04-06 DIAGNOSIS — I3139 Other pericardial effusion (noninflammatory): Secondary | ICD-10-CM | POA: Diagnosis not present

## 2021-04-06 DIAGNOSIS — R0600 Dyspnea, unspecified: Secondary | ICD-10-CM | POA: Insufficient documentation

## 2021-04-06 DIAGNOSIS — Z950 Presence of cardiac pacemaker: Secondary | ICD-10-CM | POA: Diagnosis not present

## 2021-04-07 LAB — ECHOCARDIOGRAM COMPLETE
Area-P 1/2: 2.26 cm2
Calc EF: 56.6 %
MV M vel: 5.69 m/s
MV Peak grad: 129.5 mmHg
S' Lateral: 3.5 cm
Single Plane A2C EF: 62.9 %
Single Plane A4C EF: 50.2 %

## 2021-04-13 ENCOUNTER — Ambulatory Visit (INDEPENDENT_AMBULATORY_CARE_PROVIDER_SITE_OTHER): Payer: Medicare Other

## 2021-04-13 DIAGNOSIS — I442 Atrioventricular block, complete: Secondary | ICD-10-CM

## 2021-04-14 LAB — CUP PACEART REMOTE DEVICE CHECK
Battery Impedance: 1176 Ohm
Battery Remaining Longevity: 51 mo
Battery Voltage: 2.78 V
Brady Statistic RV Percent Paced: 99 %
Date Time Interrogation Session: 20230307144730
Implantable Lead Implant Date: 20000921
Implantable Lead Implant Date: 20000921
Implantable Lead Location: 753859
Implantable Lead Location: 753860
Implantable Pulse Generator Implant Date: 20170118
Lead Channel Impedance Value: 544 Ohm
Lead Channel Impedance Value: 67 Ohm
Lead Channel Pacing Threshold Amplitude: 0.625 V
Lead Channel Pacing Threshold Pulse Width: 0.4 ms
Lead Channel Setting Pacing Amplitude: 2.5 V
Lead Channel Setting Pacing Pulse Width: 0.4 ms
Lead Channel Setting Sensing Sensitivity: 2 mV

## 2021-04-22 NOTE — Progress Notes (Signed)
Remote pacemaker transmission.   

## 2021-05-01 ENCOUNTER — Telehealth: Payer: Self-pay

## 2021-05-01 ENCOUNTER — Other Ambulatory Visit: Payer: Self-pay | Admitting: Internal Medicine

## 2021-05-01 DIAGNOSIS — K580 Irritable bowel syndrome with diarrhea: Secondary | ICD-10-CM

## 2021-05-01 MED ORDER — DIPHENOXYLATE-ATROPINE 2.5-0.025 MG PO TABS: 1.0000 | ORAL_TABLET | Freq: Four times a day (QID) | ORAL | 2 refills | Status: DC | PRN

## 2021-05-01 MED ORDER — VIBERZI 75 MG PO TABS: 75.0000 mg | ORAL_TABLET | Freq: Two times a day (BID) | ORAL | 0 refills | Status: DC

## 2021-05-01 NOTE — Telephone Encounter (Signed)
Pt informed Rx was sent ?

## 2021-05-01 NOTE — Telephone Encounter (Signed)
Pt is requesting to have a refill of Viberzi.  ? ?Pt is wanting to know if she can take what she has left it has an expiration date of 8/22. Pt feels that she is about to have an IBS diarrhea  flare up. ? ?Pharmacy: ?[facility] [address] [facility] [address] ? ?LOV 02/25/21 ? ?Please advise pt if she can take what she has or a new Rx. ?

## 2021-05-01 NOTE — Telephone Encounter (Signed)
Pt called back to report that Viberzi will be 5000$ and she is requesting another medication that's more affordable. ? ?Please advise ?

## 2021-05-02 ENCOUNTER — Other Ambulatory Visit: Payer: Self-pay | Admitting: Internal Medicine

## 2021-05-02 DIAGNOSIS — F411 Generalized anxiety disorder: Secondary | ICD-10-CM

## 2021-05-04 NOTE — Telephone Encounter (Signed)
LVM stating that new rx was sent.  ?

## 2021-05-09 DIAGNOSIS — T63441A Toxic effect of venom of bees, accidental (unintentional), initial encounter: Secondary | ICD-10-CM | POA: Diagnosis not present

## 2021-05-10 DIAGNOSIS — M79644 Pain in right finger(s): Secondary | ICD-10-CM | POA: Diagnosis not present

## 2021-05-10 DIAGNOSIS — Z79899 Other long term (current) drug therapy: Secondary | ICD-10-CM | POA: Diagnosis not present

## 2021-05-10 DIAGNOSIS — I10 Essential (primary) hypertension: Secondary | ICD-10-CM | POA: Diagnosis not present

## 2021-05-10 DIAGNOSIS — E7800 Pure hypercholesterolemia, unspecified: Secondary | ICD-10-CM | POA: Diagnosis not present

## 2021-05-10 DIAGNOSIS — M7989 Other specified soft tissue disorders: Secondary | ICD-10-CM | POA: Diagnosis not present

## 2021-05-19 DIAGNOSIS — H903 Sensorineural hearing loss, bilateral: Secondary | ICD-10-CM | POA: Diagnosis not present

## 2021-05-25 ENCOUNTER — Other Ambulatory Visit: Payer: Self-pay | Admitting: Internal Medicine

## 2021-05-25 DIAGNOSIS — E039 Hypothyroidism, unspecified: Secondary | ICD-10-CM

## 2021-06-01 ENCOUNTER — Encounter: Payer: Self-pay | Admitting: Internal Medicine

## 2021-06-01 ENCOUNTER — Ambulatory Visit: Payer: Medicare Other | Admitting: Internal Medicine

## 2021-06-01 ENCOUNTER — Ambulatory Visit (INDEPENDENT_AMBULATORY_CARE_PROVIDER_SITE_OTHER): Payer: Medicare Other | Admitting: Internal Medicine

## 2021-06-01 VITALS — BP 130/72 | HR 84 | Ht 66.0 in | Wt 115.6 lb

## 2021-06-01 DIAGNOSIS — R0602 Shortness of breath: Secondary | ICD-10-CM | POA: Diagnosis not present

## 2021-06-01 DIAGNOSIS — J4530 Mild persistent asthma, uncomplicated: Secondary | ICD-10-CM | POA: Diagnosis not present

## 2021-06-01 LAB — PULMONARY FUNCTION TEST
FEF 25-75 Post: 0.8 L/sec
FEF 25-75 Pre: 0.91 L/sec
FEF2575-%Change-Post: -12 %
FEF2575-%Pred-Post: 111 %
FEF2575-%Pred-Pre: 126 %
FEV1-%Change-Post: -4 %
FEV1-%Pred-Post: 77 %
FEV1-%Pred-Pre: 80 %
FEV1-Post: 1.21 L
FEV1-Pre: 1.27 L
FEV1FVC-%Change-Post: -12 %
FEV1FVC-%Pred-Pre: 99 %
FEV6-%Change-Post: 9 %
FEV6-%Pred-Post: 99 %
FEV6-%Pred-Pre: 90 %
FEV6-Post: 1.97 L
FEV6-Pre: 1.8 L
FEV6FVC-%Change-Post: 0 %
FEV6FVC-%Pred-Post: 107 %
FEV6FVC-%Pred-Pre: 107 %
FVC-%Change-Post: 9 %
FVC-%Pred-Post: 92 %
FVC-%Pred-Pre: 84 %
FVC-Post: 1.97 L
FVC-Pre: 1.8 L
Post FEV1/FVC ratio: 61 %
Post FEV6/FVC ratio: 100 %
Pre FEV1/FVC ratio: 70 %
Pre FEV6/FVC Ratio: 100 %

## 2021-06-01 NOTE — Progress Notes (Signed)
? ?      ?REMINGTYN [name]    [id number]    Sep 26, 1924 ? ?Primary Care Physician:Jones, Arvid Right, MD ?Date of Appointment: 06/01/2021 ?Established Patient Visit ? ?Chief complaint:   ?Chief Complaint  ?Patient presents with  ? Follow-up  ?  2 mo f/u for SOB. States her SOB has improved since last visit.   ? ? ? ?HPI: ?Alyssa [name] is a 88 y.o. woman with history of asthma here for shortness of breath ? ?Interval Updates: ?Feels great with albuterol. Usually just using once a day. Only needed twice a day last week for one time. Doesn't feel like she needs a maintenance inhaler. She is feeling well and is able to enjoy a fairly active lifestyle. Her son died last month and her other son has lung cancer and is in the hospital.  ?No flares of her breathing requiring hospital visit.  ? ?I have reviewed the patient's family social and past medical history and updated as appropriate.  ? ?Past Medical History:  ?Diagnosis Date  ? Atrial fibrillation ([place])   ? pacemaker, chronic anticoag  ? Breast cancer ([place])   ? Diverticulosis   ? Fibroma   ? inner lips/mouth  ? GERD (gastroesophageal reflux disease)   ? Hx of breast cancer   ? Hyperkalemia   ? Hypertension   ? Hypothyroidism   ? IBS (irritable bowel syndrome)   ? Internal hemorrhoids   ? Left inguinal hernia   ? direct  ? MVP (mitral valve prolapse)   ? Osteoporosis   ? Renal insufficiency   ? Thyroid cancer ([place])   ? ? ?Past Surgical History:  ?Procedure Laterality Date  ? COLONOSCOPY  10/28/2005  ? internal hemorrhoids, diverticulosis (same as in 2002 and random bxs negative then)  ? EP IMPLANTABLE DEVICE N/A 02/26/2015  ? Procedure: PPM Generator Changeout;  Surgeon: [name], MD;  Location: [facility];  Service: Cardiovascular;  Laterality: N/A;  ? MASTECTOMY  1982  ? Bilateral  ? PACEMAKER INSERTION    ? THYROIDECTOMY    ? TONSILLECTOMY    ? UPPER GASTROINTESTINAL ENDOSCOPY  09/06/2000  ? normal  ? ? ?Family History  ?Problem Relation Age of Onset  ? Colon  cancer Father 80  ? Heart disease Father   ? Stroke Mother   ? Arthritis Other   ? Hypertension Other   ? Esophageal cancer Neg Hx   ? Pancreatic cancer Neg Hx   ? Kidney disease Neg Hx   ? Liver disease Neg Hx   ? Diabetes Neg Hx   ? Rectal cancer Neg Hx   ? Stomach cancer Neg Hx   ? ? ?Social History  ? ?Occupational History  ? Occupation: Retired   ?Tobacco Use  ? Smoking status: Former  ?  Types: Cigarettes  ?  Quit date: 02/08/1954  ?  Years since quitting: 67.3  ? Smokeless tobacco: Never  ?Vaping Use  ? Vaping Use: Never used  ?Substance and Sexual Activity  ? Alcohol use: No  ?  Alcohol/week: 0.0 standard drinks  ? Drug use: No  ? Sexual activity: Not Currently  ? ? ? ?Physical Exam: ?Blood pressure 130/72, pulse 84, height '5\' 6"'$  (1.676 m), weight 115 lb 9.6 oz (52.4 kg), SpO2 96 %. ? ?Gen:      No acute distress, appears younger than stated age ?ENT:  no nasal polyps, mucus membranes moist ?Lungs:    No increased respiratory effort, symmetric chest  wall excursion, clear to auscultation bilaterally, no wheezes or crackles ?CV:         Regular rate and rhythm; no murmurs, rubs, or gallops.  No pedal edema ? ? ?Data Reviewed: ?Imaging: ?I have personally reviewed the chest xray Feb 2023 shows cardiomegaly, pace maker in place, hyperinflation.  ? ?PFTs: ? ? ?  Latest Ref Rng & Units 06/01/2021  ? 12:54 PM  ?PFT Results  ?FVC-Pre L 1.80  P  ?FVC-Predicted Pre % 84  P  ?FVC-Post L 1.97  P  ?FVC-Predicted Post % 92  P  ?Pre FEV1/FVC % % 70  P  ?Post FEV1/FCV % % 61  P  ?FEV1-Pre L 1.27  P  ?FEV1-Predicted Pre % 80  P  ?FEV1-Post L 1.21  P  ?  ?P Preliminary result  ? ?I have personally reviewed the patient's PFTs and mild airflow limitation with borderline response to BD ? ?Labs: ?Lab Results  ?Component Value Date  ? WBC 5.3 03/28/2021  ? HGB 12.5 03/28/2021  ? HCT 38.8 03/28/2021  ? MCV 97.5 03/28/2021  ? PLT 189 03/28/2021  ? ?Lab Results  ?Component Value Date  ? NA 139 03/28/2021  ? K 4.2 03/28/2021  ? CL 105  03/28/2021  ? CO2 26 03/28/2021  ? ? ?Immunization status: ?Immunization History  ?Administered Date(s) Administered  ? Fluad Quad(high Dose 65+) 10/19/2018, 11/13/2019, 11/25/2020  ? Influenza Split 11/27/2010, 12/23/2011  ? Influenza Whole 12/13/2008, 12/01/2009  ? Influenza, High Dose Seasonal PF 12/12/2012, 11/05/2015, 11/25/2016, 10/27/2017  ? Influenza,inj,Quad PF,6+ Mos 11/29/2013, 11/18/2014  ? PFIZER(Purple Top)SARS-COV-2 Vaccination 02/27/2019, 03/20/2019, 11/27/2019  ? Pneumococcal Conjugate-13 02/11/2014  ? Pneumococcal Polysaccharide-23 04/23/2010, 06/12/2019  ? Td 04/23/2010  ? Tdap 02/12/2018  ? Zoster, Live 08/24/2012  ? ? ?External Records Personally Reviewed: ED visit ? ?Assessment:  ?Mild persistent asthma ? ?Plan/Recommendations: ?Continue prn albuterol ?She will let me know if she needs to be seen sooner, and if her prn albuterol use increases to require maintenance.  ? ? ? ?Return to Care: ?Return in about 6 months (around 12/01/2021). ? ? ?Lenice Llamas, MD ?Pulmonary and Critical Care Medicine ?[place] ?Office:[phone] ? ? ? ? ? ?

## 2021-06-01 NOTE — Progress Notes (Signed)
Pre and post Spirometry completed today  ?

## 2021-06-01 NOTE — Patient Instructions (Signed)
Please schedule follow up scheduled with myself in 6 months.  If my schedule is not open yet, we will contact you with a reminder closer to that time. Please call [phone] if you haven't heard from [place] a month before.  ? ?Continue albuterol rescue inhaler. If you are consistently using it 2 times a day let me know and you may need a maintenance inhaler. ? ?

## 2021-06-02 ENCOUNTER — Other Ambulatory Visit: Payer: Self-pay | Admitting: Pharmacist

## 2021-06-02 MED ORDER — APIXABAN 2.5 MG PO TABS: 2.5000 mg | ORAL_TABLET | Freq: Two times a day (BID) | ORAL | 1 refills | Status: DC

## 2021-06-10 ENCOUNTER — Telehealth: Payer: Self-pay | Admitting: Internal Medicine

## 2021-06-10 MED ORDER — ASMANEX (60 METERED DOSES) 220 MCG/ACT IN AEPB: 2.0000 | INHALATION_SPRAY | Freq: Every day | RESPIRATORY_TRACT | 5 refills | Status: DC

## 2021-06-10 NOTE — Telephone Encounter (Signed)
Called and spoke with pt letting her know that Dr. Shearon Stalls sent Rx to pharmacy for her and she verbalized understanding. Nothing further needed. ?

## 2021-06-10 NOTE — Telephone Encounter (Signed)
Called and spoke with patient who states that she was told if she is using the Albuterol inhaler twice a day or more to call the office and let Dr. Shearon Stalls know. Patient states that she is using it twice a day and is currently out of it. She states that Dr. Shearon Stalls mentioned she may need to be on a daily/maintenance inhaler. ? ?Please advise ? ? ? ?Instructions ? ? ?Return in about 6 months (around 12/01/2021). ?Please schedule follow up scheduled with myself in 6 months.  If my schedule is not open yet, we will contact you with a reminder closer to that time. Please call [phone] if you haven't heard from [place] a month before.  ?  ?Continue albuterol rescue inhaler. If you are consistently using it 2 times a day let me know and you may need a maintenance inhaler.  ?  ? ?

## 2021-06-13 ENCOUNTER — Other Ambulatory Visit: Payer: Self-pay | Admitting: Internal Medicine

## 2021-06-13 DIAGNOSIS — I5032 Chronic diastolic (congestive) heart failure: Secondary | ICD-10-CM

## 2021-06-15 ENCOUNTER — Telehealth: Payer: Self-pay | Admitting: Internal Medicine

## 2021-06-15 NOTE — Telephone Encounter (Signed)
Patient has been taking Asmanex for 3 days (2 puffs) and it makes her feel strange and gives her cramps in her legs. Patient wants to  know if she can take 1 puff to see if that helps.  ? ?Patient has a scratchy throat in the morning and wants to know if Asmanex would help that too. ? ?Please advise, call back number is [phone]. ?

## 2021-06-15 NOTE — Telephone Encounter (Signed)
Sorry to hear that it made her feel bad she can definitely stop the Asmanex for now.  If she was to rechallenge she can try Asmanex 1 puff daily and see if she is able to tolerate this dose.  If increased symptoms stop this and call our office back we can reevaluate and get patient back into the office. ?May use albuterol as needed ? ?Please contact office for sooner follow up if symptoms do not improve or worsen or seek emergency care  ? ?

## 2021-06-15 NOTE — Telephone Encounter (Signed)
Called and spoke with pt who states she has been on Asmanex for 3 days now. States when she used it today, she began having pains all over her body, especially in her legs and also stated that it made her feel strange. ? ?Pt said that she had thought about trying to take it by doing one puff a day instead of two puffs a day but wanted to know if this would be okay or if there could be any other recommendations. ? ?With Dr. Shearon Stalls being unavailable, routing this to provider of the day. Tammy, please advise. ?

## 2021-06-16 NOTE — Telephone Encounter (Signed)
Called and spoke with patient. She verbalized understanding. She will continue the Asmanex at 1 puff once daily. I advised her to try Mucinex for the chest congestion that she is having. She has some at home that she can use. She is aware to call us back if anything changes.  ? ?Nothing further needed at time of call.  ?

## 2021-06-22 ENCOUNTER — Telehealth: Payer: Self-pay | Admitting: Internal Medicine

## 2021-06-22 NOTE — Telephone Encounter (Signed)
Called and spoke with patient. Clarified the directions and how she is supposed to take her inhaler. Patient verbalized understanding. Nothing further needed.  ?

## 2021-06-25 ENCOUNTER — Telehealth: Payer: Self-pay | Admitting: Internal Medicine

## 2021-06-25 NOTE — Telephone Encounter (Signed)
The directions for asmanex are 1 puff bid and gargle after use. If she has breakthrough symptoms she can take her albuterol inhaler. I recommend gargling with an etoh based mouthwash instead of just plain water to help prevent throat irritation and thrush.

## 2021-06-25 NOTE — Telephone Encounter (Signed)
Left message for patient to call back  

## 2021-06-25 NOTE — Telephone Encounter (Signed)
Called and spoke with patient. Patient states that after she uses her asmanex inhaler, she still has trouble with shortness of breath. Patient wants to know if she can take more puffs when it happens or what she should do.  ND, please advise.

## 2021-06-29 NOTE — Telephone Encounter (Signed)
Called and spoke with pt who states when using the asmanex inhaler, she states that it does help some but then also has SOB in between using the inhaler. Asked pt if she had the ventolin inhaler and she said no. Pt said that she did not have a ventolin inhaler as she was told by another nurse that she could not use it since she was now on the asmanex inhaler. I stated to pt that she could use the ventolin inhaler in between the asmanex as a rescue method if she had problems with SOB and she verbalized understanding. Stated to pt that she could get a refill from the pharmacy. Nothing further needed.

## 2021-07-02 ENCOUNTER — Encounter: Payer: Self-pay | Admitting: Gastroenterology

## 2021-07-07 DIAGNOSIS — M9903 Segmental and somatic dysfunction of lumbar region: Secondary | ICD-10-CM | POA: Diagnosis not present

## 2021-07-07 DIAGNOSIS — M62830 Muscle spasm of back: Secondary | ICD-10-CM | POA: Diagnosis not present

## 2021-07-07 DIAGNOSIS — M5136 Other intervertebral disc degeneration, lumbar region: Secondary | ICD-10-CM | POA: Diagnosis not present

## 2021-07-07 DIAGNOSIS — M9904 Segmental and somatic dysfunction of sacral region: Secondary | ICD-10-CM | POA: Diagnosis not present

## 2021-07-07 NOTE — Telephone Encounter (Signed)
NOTE NOT NEEDED ?

## 2021-07-08 ENCOUNTER — Ambulatory Visit (INDEPENDENT_AMBULATORY_CARE_PROVIDER_SITE_OTHER): Payer: Medicare Other | Admitting: Internal Medicine

## 2021-07-08 ENCOUNTER — Encounter: Payer: Self-pay | Admitting: Internal Medicine

## 2021-07-08 ENCOUNTER — Telehealth: Payer: Self-pay

## 2021-07-08 VITALS — BP 120/82 | HR 72 | Temp 97.8°F | Ht 66.0 in | Wt 111.0 lb

## 2021-07-08 DIAGNOSIS — M62830 Muscle spasm of back: Secondary | ICD-10-CM | POA: Diagnosis not present

## 2021-07-08 DIAGNOSIS — N1832 Chronic kidney disease, stage 3b: Secondary | ICD-10-CM | POA: Diagnosis not present

## 2021-07-08 DIAGNOSIS — K580 Irritable bowel syndrome with diarrhea: Secondary | ICD-10-CM

## 2021-07-08 DIAGNOSIS — M9903 Segmental and somatic dysfunction of lumbar region: Secondary | ICD-10-CM | POA: Diagnosis not present

## 2021-07-08 DIAGNOSIS — E039 Hypothyroidism, unspecified: Secondary | ICD-10-CM

## 2021-07-08 DIAGNOSIS — D500 Iron deficiency anemia secondary to blood loss (chronic): Secondary | ICD-10-CM

## 2021-07-08 DIAGNOSIS — I10 Essential (primary) hypertension: Secondary | ICD-10-CM

## 2021-07-08 DIAGNOSIS — M9904 Segmental and somatic dysfunction of sacral region: Secondary | ICD-10-CM | POA: Diagnosis not present

## 2021-07-08 DIAGNOSIS — M5136 Other intervertebral disc degeneration, lumbar region: Secondary | ICD-10-CM | POA: Diagnosis not present

## 2021-07-08 LAB — BASIC METABOLIC PANEL
BUN: 29 mg/dL — ABNORMAL HIGH (ref 6–23)
CO2: 28 mEq/L (ref 19–32)
Calcium: 9.7 mg/dL (ref 8.4–10.5)
Chloride: 103 mEq/L (ref 96–112)
Creatinine, Ser: 1.23 mg/dL — ABNORMAL HIGH (ref 0.40–1.20)
GFR: 37.44 mL/min — ABNORMAL LOW (ref >60.00)
Glucose, Bld: 87 mg/dL (ref 70–99)
Potassium: 4.6 mEq/L (ref 3.5–5.1)
Sodium: 141 mEq/L (ref 135–145)

## 2021-07-08 LAB — CBC WITH DIFFERENTIAL/PLATELET
Basophils Absolute: 0 10*3/uL (ref 0.0–0.1)
Basophils Relative: 0.8 % (ref 0.0–3.0)
Eosinophils Absolute: 0 10*3/uL (ref 0.0–0.7)
Eosinophils Relative: 1.2 % (ref 0.0–5.0)
HCT: 38.8 % (ref 36.0–46.0)
Hemoglobin: 12.7 g/dL (ref 12.0–15.0)
Lymphocytes Relative: 33.5 % (ref 12.0–46.0)
Lymphs Abs: 1 10*3/uL (ref 0.7–4.0)
MCHC: 32.8 g/dL (ref 30.0–36.0)
MCV: 94.6 fl (ref 78.0–100.0)
Monocytes Absolute: 0.3 10*3/uL (ref 0.1–1.0)
Monocytes Relative: 10.5 % (ref 3.0–12.0)
Neutro Abs: 1.7 10*3/uL (ref 1.4–7.7)
Neutrophils Relative %: 54 % (ref 43.0–77.0)
Platelets: 159 10*3/uL (ref 150.0–400.0)
RBC: 4.1 Mil/uL (ref 3.87–5.11)
RDW: 12.4 % (ref 11.5–15.5)
WBC: 3.1 10*3/uL — ABNORMAL LOW (ref 4.0–10.5)

## 2021-07-08 LAB — IBC + FERRITIN
Ferritin: 171.8 ng/mL (ref 10.0–291.0)
Iron: 73 ug/dL (ref 42–145)
Saturation Ratios: 19.3 % — ABNORMAL LOW (ref 20.0–50.0)
TIBC: 378 ug/dL (ref 250.0–450.0)
Transferrin: 270 mg/dL (ref 212.0–360.0)

## 2021-07-08 LAB — TSH: TSH: 7.23 u[IU]/mL — ABNORMAL HIGH (ref 0.35–5.50)

## 2021-07-08 MED ORDER — RIFAXIMIN 550 MG PO TABS: 550.0000 mg | ORAL_TABLET | Freq: Three times a day (TID) | ORAL | 0 refills | Status: AC

## 2021-07-08 NOTE — Telephone Encounter (Signed)
Key: [id number]

## 2021-07-08 NOTE — Progress Notes (Unsigned)
Subjective:  Patient ID: Alyssa [name], female    DOB: [birth year]  Age: 88 y.o. MRN: [id number]  CC: Hypothyroidism   HPI Alyssa [name] presents for f/up -  She complains of a 25-monthhistory of diarrhea, crampy abdominal pain, gas, and stool urgency.  She has not gotten much symptom relief with Imodium.  She denies nausea, vomiting, loss of appetite, bright red blood per rectum, or melena.  Outpatient Medications Prior to Visit  Medication Sig Dispense Refill   acetaminophen (TYLENOL) 650 MG CR tablet Take 1,300 mg by mouth 2 (two) times daily.     albuterol (VENTOLIN HFA) 108 (90 Base) MCG/ACT inhaler Inhale 2 puffs into the lungs every 6 (six) hours as needed. 18 g 5   apixaban (ELIQUIS) 2.5 MG TABS tablet Take 1 tablet (2.5 mg total) by mouth 2 (two) times daily. 180 tablet 1   bifidobacterium infantis (ALIGN) capsule Take 2 capsules by mouth daily.     clonazePAM (KLONOPIN) 0.5 MG tablet TAKE 1 TABLET(0.5 MG) BY MOUTH TWICE DAILY AS NEEDED FOR ANXIETY 180 tablet 1   diphenoxylate-atropine (LOMOTIL) 2.5-0.025 MG tablet Take 1 tablet by mouth 4 (four) times daily as needed for diarrhea or loose stools. 45 tablet 2   furosemide (LASIX) 40 MG tablet TAKE 1 TABLET BY MOUTH DAILY, MAY TAKE ADDITIONAL 1/2 TABLET AS NEEDED FOR WEIGHT GAIN (Patient taking differently: Take 40 mg by mouth daily. May take additional 1/2 tablet as needed for weight gain) 90 tablet 3   Homeopathic Products ([employer] EAR PAIN RELIEF OT) Place 3 drops into the left ear as needed (Pain).     levothyroxine (SYNTHROID) 50 MCG tablet TAKE 1 TABLET(50 MCG) BY MOUTH DAILY BEFORE BREAKFAST 90 tablet 0   MISC NATURAL PRODUCTS EX Apply 1 application topically in the morning and at bedtime. CBD cream for back and ankle pain     mometasone (ASMANEX, 60 METERED DOSES,) 220 MCG/ACT inhaler Inhale 2 puffs into the lungs daily. 1 each 5   potassium chloride (KLOR-CON M) 10 MEQ tablet TAKE 1 TABLET(10 MEQ) BY MOUTH DAILY 90 tablet 0    Propylene Glycol (SYSTANE COMPLETE OP) Apply 1 drop to eye as needed (dryness).     sodium chloride (OCEAN) 0.65 % SOLN nasal spray Place 1 spray into both nostrils as needed for congestion.     cimetidine (TAGAMET) 300 MG tablet Take 300 mg by mouth 2 (two) times daily.     No facility-administered medications prior to visit.    ROS Review of Systems  Constitutional:  Positive for unexpected weight change (weight loss). Negative for appetite change, chills, diaphoresis and fatigue.  HENT: Negative.    Eyes: Negative.   Respiratory:  Negative for cough, shortness of breath and wheezing.   Cardiovascular:  Negative for chest pain, palpitations and leg swelling.  Gastrointestinal:  Positive for abdominal pain and diarrhea. Negative for blood in stool, constipation, nausea, rectal pain and vomiting.  Endocrine: Negative.   Genitourinary: Negative.   Musculoskeletal: Negative.   Skin: Negative.   Neurological: Negative.  Negative for dizziness, weakness and light-headedness.  Hematological:  Negative for adenopathy. Does not bruise/bleed easily.  Psychiatric/Behavioral: Negative.     Objective:  BP 120/82 (BP Location: Right Arm, Patient Position: Sitting, Cuff Size: Normal)   Pulse 72   Temp 97.8 F (36.6 C) (Oral)   Ht '5\' 6"'$  (1.676 m)   Wt 111 lb (50.3 kg)   SpO2 96%   BMI 17.92 kg/m  BP Readings from Last 3 Encounters:  07/08/21 120/82  06/01/21 130/72  03/30/21 120/60    Wt Readings from Last 3 Encounters:  07/08/21 111 lb (50.3 kg)  06/01/21 115 lb 9.6 oz (52.4 kg)  03/30/21 113 lb 12.8 oz (51.6 kg)    Physical Exam Vitals reviewed.  HENT:     Nose: Nose normal.     Mouth/Throat:     Mouth: Mucous membranes are moist.  Eyes:     General: No scleral icterus.    Conjunctiva/sclera: Conjunctivae normal.  Cardiovascular:     Rate and Rhythm: Normal rate and regular rhythm.     Heart sounds: No murmur heard. Pulmonary:     Effort: Pulmonary effort is normal.      Breath sounds: No stridor. No wheezing, rhonchi or rales.  Abdominal:     General: Abdomen is flat. Bowel sounds are increased. There is no distension.     Palpations: Abdomen is soft. There is no hepatomegaly, splenomegaly or mass.     Tenderness: There is no abdominal tenderness. There is no guarding or rebound.  Musculoskeletal:        General: Normal range of motion.     Cervical back: Neck supple.     Right lower leg: No edema.     Left lower leg: No edema.  Skin:    General: Skin is warm and dry.  Neurological:     General: No focal deficit present.     Mental Status: She is alert.  Psychiatric:        Mood and Affect: Mood normal.        Behavior: Behavior normal.    Lab Results  Component Value Date   WBC 5.3 03/28/2021   HGB 12.5 03/28/2021   HCT 38.8 03/28/2021   PLT 189 03/28/2021   GLUCOSE 130 (H) 03/28/2021   CHOL 155 02/11/2014   TRIG 84.0 02/11/2014   HDL 46.10 02/11/2014   LDLCALC 92 02/11/2014   ALT 17 03/14/2020   AST 20 03/14/2020   NA 139 03/28/2021   K 4.2 03/28/2021   CL 105 03/28/2021   CREATININE 1.24 (H) 03/28/2021   BUN 33 (H) 03/28/2021   CO2 26 03/28/2021   TSH 7.26 (H) 02/25/2021   INR 1.3 (A) 03/17/2020   HGBA1C 5.7 06/12/2019    ECHOCARDIOGRAM COMPLETE  Result Date: 04/07/2021    ECHOCARDIOGRAM REPORT   Patient Name:   Alyssa [name] Date of Exam: 04/06/2021 Medical Rec #:  [id number]    Height:       66.5 in Accession #:    [id number]   Weight:       113.8 lb Date of Birth:  04-Nov-1924    BSA:          1.582 m Patient Age:    16 years     BP:           120/60 mmHg Patient Gender: F            HR:           73 bpm. Exam Location:  Outpatient Procedure: 2D Echo, Color Doppler and Cardiac Doppler Indications:    Dyspnea R06.00  History:        Patient has prior history of Echocardiogram examinations, most                 recent 07/03/2019. Pacemaker; Risk Factors:Hypertension.  Sonographer:    Mikki Santee RDCS Referring Phys: [id number]  Webb  IMPRESSIONS  1. Left ventricular ejection fraction, by estimation, is 55 to 60%. The left ventricle has normal function. The left ventricle has no regional wall motion abnormalities. Left ventricular diastolic parameters were normal. There is hypokinesis of the left  ventricular, apical segment.  2. Right ventricular systolic function is normal. The right ventricular size is mildly enlarged. There is normal pulmonary artery systolic pressure. The estimated right ventricular systolic pressure is 81.0 mmHg.  3. Left atrial size was severely dilated.  4. Right atrial size was severely dilated.  5. A small pericardial effusion is present. The pericardial effusion is anterior to the right ventricle.  6. The mitral valve is normal in structure. Mild mitral valve regurgitation. No evidence of mitral stenosis.  7. Tricuspid valve regurgitation is severe.  8. The aortic valve is normal in structure. Aortic valve regurgitation is not visualized. No aortic stenosis is present.  9. The inferior vena cava is dilated in size with >50% respiratory variability, suggesting right atrial pressure of 8 mmHg. FINDINGS  Left Ventricle: Left ventricular ejection fraction, by estimation, is 55 to 60%. The left ventricle has normal function. The left ventricle has no regional wall motion abnormalities. The left ventricular internal cavity size was normal in size. There is  no left ventricular hypertrophy. Left ventricular diastolic parameters were normal. Normal left ventricular filling pressure. Right Ventricle: The right ventricular size is mildly enlarged. No increase in right ventricular wall thickness. Right ventricular systolic function is normal. There is normal pulmonary artery systolic pressure. The tricuspid regurgitant velocity is 2.58  m/s, and with an assumed right atrial pressure of 8 mmHg, the estimated right ventricular systolic pressure is 17.5 mmHg. Left Atrium: Left atrial size was severely dilated. Right  Atrium: Right atrial size was severely dilated. Pericardium: A small pericardial effusion is present. The pericardial effusion is anterior to the right ventricle. Mitral Valve: The mitral valve is normal in structure. Mild mitral annular calcification. Mild mitral valve regurgitation. No evidence of mitral valve stenosis. Tricuspid Valve: The tricuspid valve is normal in structure. Tricuspid valve regurgitation is severe. No evidence of tricuspid stenosis. Aortic Valve: The aortic valve is normal in structure. Aortic valve regurgitation is not visualized. No aortic stenosis is present. Pulmonic Valve: The pulmonic valve was normal in structure. Pulmonic valve regurgitation is not visualized. No evidence of pulmonic stenosis. Aorta: The aortic root is normal in size and structure. Venous: The inferior vena cava is dilated in size with greater than 50% respiratory variability, suggesting right atrial pressure of 8 mmHg. IAS/Shunts: No atrial level shunt detected by color flow Doppler. Additional Comments: A device lead is visualized.  LEFT VENTRICLE PLAX 2D LVIDd:         4.30 cm     Diastology LVIDs:         3.50 cm     LV e' medial:    9.37 cm/s LV PW:         1.10 cm     LV E/e' medial:  7.4 LV IVS:        0.80 cm     LV e' lateral:   9.90 cm/s LVOT diam:     2.00 cm     LV E/e' lateral: 7.0 LV SV:         44 LV SV Index:   28 LVOT Area:     3.14 cm  LV Volumes (MOD) LV vol d, MOD A2C: 42.6 ml LV vol d, MOD A4C: 40.4 ml LV vol s,  MOD A2C: 15.8 ml LV vol s, MOD A4C: 20.1 ml LV SV MOD A2C:     26.8 ml LV SV MOD A4C:     40.4 ml LV SV MOD BP:      24.7 ml RIGHT VENTRICLE RV S prime:     9.73 cm/s TAPSE (M-mode): 2.0 cm LEFT ATRIUM             Index        RIGHT ATRIUM           Index LA diam:        4.10 cm 2.59 cm/m   RA Area:     47.10 cm LA Vol (A2C):   83.0 ml 52.45 ml/m  RA Volume:   212.00 ml 133.97 ml/m LA Vol (A4C):   75.4 ml 47.65 ml/m LA Biplane Vol: 86.5 ml 54.66 ml/m  AORTIC VALVE LVOT Vmax:   66.20  cm/s LVOT Vmean:  46.700 cm/s LVOT VTI:    0.139 m  AORTA Ao Root diam: 2.90 cm Ao Asc diam:  2.80 cm MITRAL VALVE               TRICUSPID VALVE MV Area (PHT): 2.26 cm    TR Peak grad:   26.6 mmHg MV Decel Time: 335 msec    TR Vmax:        258.00 cm/s MR Peak grad: 129.5 mmHg MR Mean grad: 89.0 mmHg    SHUNTS MR Vmax:      569.00 cm/s  Systemic VTI:  0.14 m MR Vmean:     443.0 cm/s   Systemic Diam: 2.00 cm MV E velocity: 69.40 cm/s MV A velocity: 24.00 cm/s MV E/A ratio:  2.89 Fransico Him MD Electronically signed by Fransico Him MD Signature Date/Time: 04/07/2021/7:09:54 AM    Final     Assessment & Plan:   Araly was seen today for hypothyroidism.  Diagnoses and all orders for this visit:  Essential hypertension, benign- Her blood pressure is adequately well controlled. -     Basic metabolic panel; Future  Acquired hypothyroidism- She is euthyroid. -     TSH; Future  Chronic renal impairment, stage 3b ([place])- She is avoiding nephrotoxic agents. -     Basic metabolic panel; Future  Iron deficiency anemia due to chronic blood loss- Her H&H and iron levels are normal. -     IBC + Ferritin; Future -     CBC with Differential/Platelet; Future  Irritable bowel syndrome with diarrhea -     rifaximin (XIFAXAN) 550 MG TABS tablet; Take 1 tablet (550 mg total) by mouth 3 (three) times daily for 14 days.   I am having Alyssa [name] start on rifaximin. I am also having her maintain her acetaminophen, MISC NATURAL PRODUCTS EX, bifidobacterium infantis, furosemide, Propylene Glycol (SYSTANE COMPLETE OP), Homeopathic Products ([employer] EAR PAIN RELIEF OT), sodium chloride, albuterol, diphenoxylate-atropine, clonazePAM, levothyroxine, apixaban, Asmanex (60 Metered Doses), potassium chloride, and cimetidine.  Meds ordered this encounter  Medications   rifaximin (XIFAXAN) 550 MG TABS tablet    Sig: Take 1 tablet (550 mg total) by mouth 3 (three) times daily for 14 days.    Dispense:  42 tablet    Refill:  0      Follow-up: Return in about 3 months (around 10/08/2021).  [name], MD

## 2021-07-08 NOTE — Patient Instructions (Signed)
Irritable Bowel Syndrome, Adult  Irritable bowel syndrome (IBS) is a group of symptoms that affects the organs responsible for digestion (gastrointestinal tract, or GI tract). IBS is not one specific disease. To regulate how the GI tract works, the body sends signals back and forth between the intestines and the brain. If you have IBS, there may be a problem with these signals. As a result, the GI tract does not function normally. The intestines may become more sensitive and overreact to certain things. This may be especially true when you eat certain foods or when you are under stress. There are four main types of IBS. These may be determined based on the consistency of your stool (feces): IBS with mostly (predominance of) diarrhea. IBS with predominance of constipation. IBS with mixed bowel habits. This includes both diarrhea and constipation. IBS unclassified. This includes IBS that cannot be categorized into one of the other three main types. It is important to know which type of IBS you have. Certain treatments are more likely to be helpful for certain types of IBS. What are the causes? The exact cause of IBS is not known. What increases the risk? You may have a higher risk for IBS if you: Are female. Are younger than 40 years. Have a family history of IBS. Have a mental health condition, such as depression, anxiety, or post-traumatic stress disorder. Have had a bacterial infection of your GI tract. What are the signs or symptoms? Symptoms of IBS vary from person to person. The main symptom is abdominal pain or discomfort. Other symptoms usually include one or more of the following: Diarrhea, constipation, or both. Swelling or bloating in the abdomen. Feeling full after eating a small or regular-sized meal. Frequent gas. Mucus in the stool. A feeling of having more stool left after a bowel movement. Symptoms tend to come and go. They may be triggered by stress, mental health  conditions, or certain foods. How is this diagnosed? This condition may be diagnosed based on a physical exam, your medical history, and your symptoms. You may have tests, such as: Blood tests. Stool test. Colonoscopy. This is a procedure in which your GI tract is viewed with a long, thin, flexible tube. How is this treated? There is no cure for IBS, but treatment can help relieve symptoms. Treatment depends on the type of IBS you have, and may include: Changes to your diet, such as: Avoiding foods that cause symptoms. Drinking more water. Following a low-FODMAP (fermentable oligosaccharides, disaccharides, monosaccharides, and polyols) diet for up to 6 weeks, or as told by your health care provider. FODMAPs are sugars that are hard for some people to digest. Eating more fiber. Eating small meals at the same times every day. Medicines. These may include: Fiber supplements, if you have constipation. Medicine to control diarrhea (antidiarrheal medicines). Medicine to help control muscle tightening (spasms) in your GI tract (antispasmodic medicines). Medicines to help with mental health conditions, such as antidepressants. Talk therapy or counseling. Working with a dietitian to help create a food plan that is right for you. Managing your stress. Follow these instructions at home: Eating and drinking  Eat a healthy diet. Eat 5-6 small meals a day. Try to eat meals at about the same times each day. Do not eat large meals. Gradually eat more fiber-rich foods. These include whole grains, fruits, and vegetables. This may be especially helpful if you have IBS with constipation. Eat a diet low in FODMAPs. You may need to avoid foods such as   citrus fruits, cabbage, garlic, and onions. Drink enough fluid to keep your urine pale yellow. Keep a journal of foods that seem to trigger symptoms. Avoid foods and drinks that: Contain added sugar. Make your symptoms worse. These may include dairy  products, caffeinated drinks, and carbonated drinks. Alcohol use Do not drink alcohol if: Your health care provider tells you not to drink. You are pregnant, may be pregnant, or are planning to become pregnant. If you drink alcohol: Limit how much you have to: 0-1 drink a day for women. 0-2 drinks a day for men. Know how much alcohol is in your drink. In the [place] one drink equals one 12 oz bottle of beer (355 mL), one 5 oz glass of wine (148 mL), or one 1 oz glass of hard liquor (44 mL) General instructions Take over-the-counter and prescription medicines only as told by your health care provider. This includes supplements. Get enough exercise. Do at least 150 minutes of moderate-intensity exercise each week. Manage your stress. Getting enough sleep and exercise can help you manage stress. Keep all follow-up visits. This is important. This includes all visits with your health care provider and therapist. Where to find more information International Foundation for Functional Gastrointestinal Disorders: aboutibs.org National Institute of Diabetes and Digestive and Kidney Diseases: niddk.nih.gov Contact a health care provider if: You have constant pain. You lose weight. You have diarrhea that gets worse. You have bleeding from the rectum. You vomit often. You have a fever. Get help right away if: You have severe abdominal pain. You have diarrhea with symptoms of dehydration, such as dizziness or dry mouth. You have bloody or black stools. You have severe abdominal bloating. You have vomiting that does not stop. You have blood in your vomit. Summary Irritable bowel syndrome (IBS) is not one specific disease. It is a group of symptoms that affects digestion. Your intestines may become more sensitive and overreact to certain things. This may be especially true when you eat certain foods or when you are under stress. There is no cure for IBS, but treatment can help relieve  symptoms. This information is not intended to replace advice given to you by your health care provider. Make sure you discuss any questions you have with your health care provider. Document Revised: 01/07/2021 Document Reviewed: 01/07/2021 Elsevier Patient Education  2023 Elsevier Inc.  

## 2021-07-08 NOTE — Telephone Encounter (Signed)
Approved Effective from 07/08/2021 through 07/09/2022.

## 2021-07-09 ENCOUNTER — Telehealth: Payer: Self-pay | Admitting: Internal Medicine

## 2021-07-09 DIAGNOSIS — M9903 Segmental and somatic dysfunction of lumbar region: Secondary | ICD-10-CM | POA: Diagnosis not present

## 2021-07-09 DIAGNOSIS — M9904 Segmental and somatic dysfunction of sacral region: Secondary | ICD-10-CM | POA: Diagnosis not present

## 2021-07-09 DIAGNOSIS — M62830 Muscle spasm of back: Secondary | ICD-10-CM | POA: Diagnosis not present

## 2021-07-09 DIAGNOSIS — M5136 Other intervertebral disc degeneration, lumbar region: Secondary | ICD-10-CM | POA: Diagnosis not present

## 2021-07-09 NOTE — Telephone Encounter (Signed)
Regarding zyu 1 year startting on 07/08/2021 - member will reeive

## 2021-07-13 ENCOUNTER — Ambulatory Visit (INDEPENDENT_AMBULATORY_CARE_PROVIDER_SITE_OTHER): Payer: Medicare Other

## 2021-07-13 ENCOUNTER — Telehealth: Payer: Self-pay | Admitting: Internal Medicine

## 2021-07-13 ENCOUNTER — Other Ambulatory Visit: Payer: Self-pay | Admitting: Internal Medicine

## 2021-07-13 DIAGNOSIS — K6389 Other specified diseases of intestine: Secondary | ICD-10-CM

## 2021-07-13 DIAGNOSIS — I442 Atrioventricular block, complete: Secondary | ICD-10-CM

## 2021-07-13 MED ORDER — SULFAMETHOXAZOLE-TRIMETHOPRIM 400-80 MG PO TABS: 1.0000 | ORAL_TABLET | Freq: Two times a day (BID) | ORAL | 0 refills | Status: AC

## 2021-07-13 NOTE — Telephone Encounter (Signed)
Patient called and never got her medication for IBS - it was going to be too expensive.  Patient would like something else called in, but she would also like to speak with Dr. Stevenson Clinch nurse.

## 2021-07-13 NOTE — Progress Notes (Unsigned)
Estimated Creatinine Clearance: 21.7 mL/min (A) (by C-G formula based on SCr of 1.23 mg/dL (H)).

## 2021-07-13 NOTE — Telephone Encounter (Signed)
Pt has been informed that PA for xifaxan was approved on 5/31. I recommended that she check with her pharmacy for the new price point with it approved through insurance now as she stated the pharmacy quoted her $3000 for Rx prior to PA.   I have stated that if the co-pay is still to high to let us know and PCP can Rx a different medication.

## 2021-07-13 NOTE — Telephone Encounter (Signed)
Pt states that the medication is still 600$ with insurance.   Pharmacy: [facility] [place], [address] AT [place]  Pt is requesting a cb with the name of medication that will be called in.  Please advise

## 2021-07-14 LAB — CUP PACEART REMOTE DEVICE CHECK
Battery Impedance: 1286 Ohm
Battery Remaining Longevity: 47 mo
Battery Voltage: 2.77 V
Brady Statistic RV Percent Paced: 99 %
Date Time Interrogation Session: 20230605084231
Implantable Lead Implant Date: 20000921
Implantable Lead Implant Date: 20000921
Implantable Lead Location: 753859
Implantable Lead Location: 753860
Implantable Pulse Generator Implant Date: 20170118
Lead Channel Impedance Value: 561 Ohm
Lead Channel Impedance Value: 67 Ohm
Lead Channel Pacing Threshold Amplitude: 0.875 V
Lead Channel Pacing Threshold Pulse Width: 0.4 ms
Lead Channel Setting Pacing Amplitude: 2.5 V
Lead Channel Setting Pacing Pulse Width: 0.46 ms
Lead Channel Setting Sensing Sensitivity: 2 mV

## 2021-07-14 NOTE — Telephone Encounter (Signed)
I called patient to let her know that another Rx was sent in to [place]. Pt is going to see if this one is more affordable.

## 2021-07-23 DIAGNOSIS — L97921 Non-pressure chronic ulcer of unspecified part of left lower leg limited to breakdown of skin: Secondary | ICD-10-CM | POA: Diagnosis not present

## 2021-07-23 DIAGNOSIS — Z23 Encounter for immunization: Secondary | ICD-10-CM | POA: Diagnosis not present

## 2021-07-24 ENCOUNTER — Encounter: Payer: Self-pay | Admitting: Gastroenterology

## 2021-07-24 ENCOUNTER — Telehealth: Payer: Self-pay | Admitting: Internal Medicine

## 2021-07-24 ENCOUNTER — Ambulatory Visit: Payer: Medicare Other | Admitting: Gastroenterology

## 2021-07-24 VITALS — BP 120/70 | HR 65 | Ht 66.0 in | Wt 102.0 lb

## 2021-07-24 DIAGNOSIS — K580 Irritable bowel syndrome with diarrhea: Secondary | ICD-10-CM | POA: Diagnosis not present

## 2021-07-24 NOTE — Progress Notes (Signed)
07/24/2021 ARTURO FREUNDLICH [id number] June 04, 1924  HISTORY OF PRESENT ILLNESS:  This is a 88 year old female who is a patient of Dr. Celesta Aver.  Not seen here since 2017.  Has IBS-D.  She has been having a time with her diarrhea again lately. Was treated with Bactrim by her PCP for SIBO and just completed that.  Xifaxan was prescribed initially but that was too expensive.  Maybe some improvement with the Bactrim.  She has also been eating very bland/brat diet.  Her weight is down about 10 to 11 pounds compared to earlier this year.  She has Imodium at home that she has been using intermittently.  At this appointment she really wanted to discuss what she should or should not be eating in order to avoid issues again.  Sounds like there may be a component of lactose intolerance and since she does eat ice cream often.  It looks like she had been given Lomotil in the past.  Past Medical History:  Diagnosis Date   Anxiety    Arthritis    Atrial fibrillation ([place])    pacemaker, chronic anticoag   Breast cancer ([place])    Diverticulosis    Fibroma    inner lips/mouth   GERD (gastroesophageal reflux disease)    Hx of breast cancer    Hyperkalemia    Hypertension    Hypothyroidism    IBS (irritable bowel syndrome)    Internal hemorrhoids    Left inguinal hernia    direct   MVP (mitral valve prolapse)    Osteoporosis    Renal insufficiency    Status post dilation of esophageal narrowing    Thyroid cancer [facility])    Past Surgical History:  Procedure Laterality Date   COLONOSCOPY  10/28/2005   internal hemorrhoids, diverticulosis (same as in 2002 and random bxs negative then)   EP IMPLANTABLE DEVICE N/A 02/26/2015   Procedure: PPM Generator Changeout;  Surgeon: [name], MD;  Location: [facility];  Service: Cardiovascular;  Laterality: N/A;   MASTECTOMY  1982   Bilateral   PACEMAKER INSERTION     THYROIDECTOMY     TONSILLECTOMY     UPPER GASTROINTESTINAL ENDOSCOPY  09/06/2000    normal    reports that she quit smoking about 67 years ago. Her smoking use included cigarettes. She has never used smokeless tobacco. She reports that she does not drink alcohol and does not use drugs. family history includes Arthritis in an other family member; Colon cancer (age of onset: 27) in her father; Heart disease in her father; Hypertension in an other family member; Stroke in her mother. Allergies  Allergen Reactions   Penicillins Diarrhea    Has IBS (caused diarrhea)     Outpatient Encounter Medications as of 07/24/2021  Medication Sig   acetaminophen (TYLENOL) 650 MG CR tablet Take 1,300 mg by mouth 2 (two) times daily.   albuterol (VENTOLIN HFA) 108 (90 Base) MCG/ACT inhaler Inhale 2 puffs into the lungs every 6 (six) hours as needed.   apixaban (ELIQUIS) 2.5 MG TABS tablet Take 1 tablet (2.5 mg total) by mouth 2 (two) times daily.   bifidobacterium infantis (ALIGN) capsule Take 2 capsules by mouth daily.   cimetidine (TAGAMET) 300 MG tablet Take 300 mg by mouth 2 (two) times daily.   clonazePAM (KLONOPIN) 0.5 MG tablet TAKE 1 TABLET(0.5 MG) BY MOUTH TWICE DAILY AS NEEDED FOR ANXIETY   diphenoxylate-atropine (LOMOTIL) 2.5-0.025 MG tablet Take 1 tablet by  mouth 4 (four) times daily as needed for diarrhea or loose stools.   furosemide (LASIX) 40 MG tablet TAKE 1 TABLET BY MOUTH DAILY, MAY TAKE ADDITIONAL 1/2 TABLET AS NEEDED FOR WEIGHT GAIN (Patient taking differently: Take 40 mg by mouth daily. May take additional 1/2 tablet as needed for weight gain)   Homeopathic Products ([employer] EAR PAIN RELIEF OT) Place 3 drops into the left ear as needed (Pain).   levothyroxine (SYNTHROID) 50 MCG tablet TAKE 1 TABLET(50 MCG) BY MOUTH DAILY BEFORE BREAKFAST   MISC NATURAL PRODUCTS EX Apply 1 application topically in the morning and at bedtime. CBD cream for back and ankle pain   mometasone (ASMANEX, 60 METERED DOSES,) 220 MCG/ACT inhaler Inhale 2 puffs into the lungs daily.   mupirocin ointment  (BACTROBAN) 2 % SMARTSIG:1 Application Topical 2-3 Times Daily   potassium chloride (KLOR-CON M) 10 MEQ tablet TAKE 1 TABLET(10 MEQ) BY MOUTH DAILY   Propylene Glycol (SYSTANE COMPLETE OP) Apply 1 drop to eye as needed (dryness).   sodium chloride (OCEAN) 0.65 % SOLN nasal spray Place 1 spray into both nostrils as needed for congestion.   No facility-administered encounter medications on file as of 07/24/2021.    REVIEW OF SYSTEMS  : All other systems reviewed and negative except where noted in the History of Present Illness.   PHYSICAL EXAM: BP 120/70   Pulse 65   Ht '5\' 6"'$  (1.676 m)   Wt 102 lb (46.3 kg)   BMI 16.46 kg/m  General: Well developed white female in no acute distress Head: Normocephalic and atraumatic Eyes:  Sclerae anicteric, conjunctiva pink. Ears: Normal auditory acuity Lungs: Clear throughout to auscultation; no W/R/R. Heart: Regular rate and rhythm; no M/R/G. Abdomen: Soft, non-distended.  BS present and somewhat hyperactive.  Non-tender. Musculoskeletal: Symmetrical with no gross deformities  Skin: No lesions on visible extremities Extremities: No edema  Neurological: Alert oriented x 4, grossly non-focal Psychological:  Alert and cooperative. Normal mood and affect  ASSESSMENT AND PLAN: *88 year old female with history of IBS-D.  Not seen here since 2017.  She has been having a time with her diarrhea again lately. Was treated with Bactrim by her PCP and just completed that.  Maybe some improvement.  She has also been eating very bland/brat diet.  Her weight is down about 10 to 11 pounds compared to earlier this year.  She has Imodium at home that she has been using intermittently.  I told her she could certainly use it on a daily basis starting with a half a tablet every morning and titrating further as needed from there, being sure that she does not get constipated.  We also discussed dietary measures, I told her that if she is eating a lot of salads and fruits and  vegetables then her stools may be looser.  Sounds like there may be a component of lactose intolerance since she does eat ice cream often so she will try to cut that out for a while.  She will call us back with an update on her symptoms in 2 to 3 weeks.  If worsening again or still no improvement then may need to consider stool studies and CT scan especially in light of the weight loss.  Other treatment options would be to retry Lomotil, pancreatic enzymes..  She is really not have any abdominal pain so would probably try to avoid antispasmodics in her age group.  Apparently Zofran had been mentioned as it has shown success in helping with diarrhea  predominant irritable bowel syndrome.   CC:  [name], MD

## 2021-07-24 NOTE — Telephone Encounter (Signed)
Pt questioned her instructions for the Imodium that was discussed at office visit earlier today:  Chart reviewed. Pt was made aware that her provider stated that    "She has Imodium at home that she has been using intermittently.  I told her she could certainly use it on a daily basis starting with a half a tablet every morning and titrating further as needed from there, being sure that she does not get constipated."  Pt verbalized understanding with all questions answered.

## 2021-07-24 NOTE — Patient Instructions (Addendum)
Loperamide (Imodium) daily, titrated as discussed.   Call back in 2-3 weeks with an update.   Consider stopping dairy/ice cream.

## 2021-07-28 ENCOUNTER — Ambulatory Visit (INDEPENDENT_AMBULATORY_CARE_PROVIDER_SITE_OTHER): Payer: Medicare Other | Admitting: Internal Medicine

## 2021-07-28 ENCOUNTER — Encounter: Payer: Self-pay | Admitting: Internal Medicine

## 2021-07-28 VITALS — BP 138/74 | HR 88 | Temp 98.0°F | Ht 66.0 in | Wt 108.0 lb

## 2021-07-28 DIAGNOSIS — I10 Essential (primary) hypertension: Secondary | ICD-10-CM

## 2021-07-28 DIAGNOSIS — K4090 Unilateral inguinal hernia, without obstruction or gangrene, not specified as recurrent: Secondary | ICD-10-CM | POA: Diagnosis not present

## 2021-07-28 NOTE — Progress Notes (Signed)
Subjective:  Patient ID: Alyssa [name], female    DOB: July 19, 1924  Age: 88 y.o. MRN: [id number]  CC: Abdominal Pain   HPI Alyssa [name] presents for f/up -  She had a brief episode of abdominal pain 3 days ago.  She looked down and saw a bulge in her left groin.  Her diarrhea has resolved.  She denies nausea, vomiting, fever, chills, dysuria, or constipation.  Outpatient Medications Prior to Visit  Medication Sig Dispense Refill   acetaminophen (TYLENOL) 650 MG CR tablet Take 1,300 mg by mouth 2 (two) times daily.     albuterol (VENTOLIN HFA) 108 (90 Base) MCG/ACT inhaler Inhale 2 puffs into the lungs every 6 (six) hours as needed. 18 g 5   apixaban (ELIQUIS) 2.5 MG TABS tablet Take 1 tablet (2.5 mg total) by mouth 2 (two) times daily. 180 tablet 1   bifidobacterium infantis (ALIGN) capsule Take 2 capsules by mouth daily.     clonazePAM (KLONOPIN) 0.5 MG tablet TAKE 1 TABLET(0.5 MG) BY MOUTH TWICE DAILY AS NEEDED FOR ANXIETY 180 tablet 1   diphenoxylate-atropine (LOMOTIL) 2.5-0.025 MG tablet Take 1 tablet by mouth 4 (four) times daily as needed for diarrhea or loose stools. 45 tablet 2   furosemide (LASIX) 40 MG tablet TAKE 1 TABLET BY MOUTH DAILY, MAY TAKE ADDITIONAL 1/2 TABLET AS NEEDED FOR WEIGHT GAIN (Patient taking differently: Take 40 mg by mouth daily. May take additional 1/2 tablet as needed for weight gain) 90 tablet 3   Homeopathic Products ([employer] EAR PAIN RELIEF OT) Place 3 drops into the left ear as needed (Pain).     levothyroxine (SYNTHROID) 50 MCG tablet TAKE 1 TABLET(50 MCG) BY MOUTH DAILY BEFORE BREAKFAST 90 tablet 0   MISC NATURAL PRODUCTS EX Apply 1 application topically in the morning and at bedtime. CBD cream for back and ankle pain     mometasone (ASMANEX, 60 METERED DOSES,) 220 MCG/ACT inhaler Inhale 2 puffs into the lungs daily. 1 each 5   mupirocin ointment (BACTROBAN) 2 % SMARTSIG:1 Application Topical 2-3 Times Daily     potassium chloride (KLOR-CON M) 10 MEQ  tablet TAKE 1 TABLET(10 MEQ) BY MOUTH DAILY 90 tablet 0   Propylene Glycol (SYSTANE COMPLETE OP) Apply 1 drop to eye as needed (dryness).     sodium chloride (OCEAN) 0.65 % SOLN nasal spray Place 1 spray into both nostrils as needed for congestion.     cimetidine (TAGAMET) 300 MG tablet Take 300 mg by mouth 2 (two) times daily.     No facility-administered medications prior to visit.    ROS Review of Systems  Constitutional: Negative.  Negative for chills and fever.  HENT: Negative.    Eyes: Negative.   Respiratory:  Negative for cough, chest tightness, shortness of breath and wheezing.   Cardiovascular:  Negative for chest pain, palpitations and leg swelling.  Gastrointestinal:  Positive for abdominal pain. Negative for constipation, diarrhea, nausea and vomiting.  Endocrine: Negative.   Genitourinary: Negative.  Negative for decreased urine volume, difficulty urinating, dysuria and hematuria.  Musculoskeletal: Negative.   Skin: Negative.   Neurological:  Negative for dizziness and weakness.  Hematological:  Negative for adenopathy. Does not bruise/bleed easily.  Psychiatric/Behavioral: Negative.      Objective:  BP 138/74 (BP Location: Right Arm, Patient Position: Sitting, Cuff Size: Large)   Pulse 88   Temp 98 F (36.7 C) (Oral)   Ht '5\' 6"'$  (1.676 m)   Wt 108 lb (49 kg)  SpO2 96%   BMI 17.43 kg/m   BP Readings from Last 3 Encounters:  07/28/21 138/74  07/24/21 120/70  07/08/21 120/82    Wt Readings from Last 3 Encounters:  07/28/21 108 lb (49 kg)  07/24/21 102 lb (46.3 kg)  07/08/21 111 lb (50.3 kg)    Physical Exam Vitals reviewed.  Constitutional:      Appearance: She is not ill-appearing.  HENT:     Mouth/Throat:     Mouth: Mucous membranes are moist.  Eyes:     General: No scleral icterus.    Conjunctiva/sclera: Conjunctivae normal.  Cardiovascular:     Rate and Rhythm: Normal rate and regular rhythm.     Heart sounds: No murmur heard. Pulmonary:      Breath sounds: No stridor. No wheezing, rhonchi or rales.  Abdominal:     General: Abdomen is flat. Bowel sounds are decreased. There is no distension.     Palpations: Abdomen is soft. There is no hepatomegaly, splenomegaly or mass.     Tenderness: There is no abdominal tenderness. There is no guarding or rebound.     Hernia: A hernia is present. Hernia is present in the left inguinal area. There is no hernia in the umbilical area, ventral area, right femoral area, left femoral area or right inguinal area.     Comments: There is a moderate-sized, direct, left inguinal hernia that is easily reduced.  Musculoskeletal:        General: Normal range of motion.     Cervical back: Neck supple.     Right lower leg: No edema.     Left lower leg: No edema.  Lymphadenopathy:     Cervical: No cervical adenopathy.  Skin:    General: Skin is warm and dry.  Neurological:     General: No focal deficit present.     Mental Status: She is alert.  Psychiatric:        Mood and Affect: Mood normal.     Lab Results  Component Value Date   WBC 3.1 (L) 07/08/2021   HGB 12.7 07/08/2021   HCT 38.8 07/08/2021   PLT 159.0 07/08/2021   GLUCOSE 87 07/08/2021   CHOL 155 02/11/2014   TRIG 84.0 02/11/2014   HDL 46.10 02/11/2014   LDLCALC 92 02/11/2014   ALT 17 03/14/2020   AST 20 03/14/2020   NA 141 07/08/2021   K 4.6 07/08/2021   CL 103 07/08/2021   CREATININE 1.23 (H) 07/08/2021   BUN 29 (H) 07/08/2021   CO2 28 07/08/2021   TSH 7.23 (H) 07/08/2021   INR 1.3 (A) 03/17/2020   HGBA1C 5.7 06/12/2019    ECHOCARDIOGRAM COMPLETE  Result Date: 04/07/2021    ECHOCARDIOGRAM REPORT   Patient Name:   Alyssa [name] Date of Exam: 04/06/2021 Medical Rec #:  [id number]    Height:       66.5 in Accession #:    [id number]   Weight:       113.8 lb Date of Birth:  Jun 15, 1924    BSA:          1.582 m Patient Age:    76 years     BP:           120/60 mmHg Patient Gender: F            HR:           73 bpm. Exam Location:   Outpatient Procedure: 2D Echo, Color Doppler and Cardiac Doppler Indications:  Dyspnea R06.00  History:        Patient has prior history of Echocardiogram examinations, most                 recent 07/03/2019. Pacemaker; Risk Factors:Hypertension.  Sonographer:    Mikki Santee RDCS Referring Phys: [id number] [place]  1. Left ventricular ejection fraction, by estimation, is 55 to 60%. The left ventricle has normal function. The left ventricle has no regional wall motion abnormalities. Left ventricular diastolic parameters were normal. There is hypokinesis of the left  ventricular, apical segment.  2. Right ventricular systolic function is normal. The right ventricular size is mildly enlarged. There is normal pulmonary artery systolic pressure. The estimated right ventricular systolic pressure is 13.2 mmHg.  3. Left atrial size was severely dilated.  4. Right atrial size was severely dilated.  5. A small pericardial effusion is present. The pericardial effusion is anterior to the right ventricle.  6. The mitral valve is normal in structure. Mild mitral valve regurgitation. No evidence of mitral stenosis.  7. Tricuspid valve regurgitation is severe.  8. The aortic valve is normal in structure. Aortic valve regurgitation is not visualized. No aortic stenosis is present.  9. The inferior vena cava is dilated in size with >50% respiratory variability, suggesting right atrial pressure of 8 mmHg. FINDINGS  Left Ventricle: Left ventricular ejection fraction, by estimation, is 55 to 60%. The left ventricle has normal function. The left ventricle has no regional wall motion abnormalities. The left ventricular internal cavity size was normal in size. There is  no left ventricular hypertrophy. Left ventricular diastolic parameters were normal. Normal left ventricular filling pressure. Right Ventricle: The right ventricular size is mildly enlarged. No increase in right ventricular wall thickness. Right  ventricular systolic function is normal. There is normal pulmonary artery systolic pressure. The tricuspid regurgitant velocity is 2.58  m/s, and with an assumed right atrial pressure of 8 mmHg, the estimated right ventricular systolic pressure is 44.0 mmHg. Left Atrium: Left atrial size was severely dilated. Right Atrium: Right atrial size was severely dilated. Pericardium: A small pericardial effusion is present. The pericardial effusion is anterior to the right ventricle. Mitral Valve: The mitral valve is normal in structure. Mild mitral annular calcification. Mild mitral valve regurgitation. No evidence of mitral valve stenosis. Tricuspid Valve: The tricuspid valve is normal in structure. Tricuspid valve regurgitation is severe. No evidence of tricuspid stenosis. Aortic Valve: The aortic valve is normal in structure. Aortic valve regurgitation is not visualized. No aortic stenosis is present. Pulmonic Valve: The pulmonic valve was normal in structure. Pulmonic valve regurgitation is not visualized. No evidence of pulmonic stenosis. Aorta: The aortic root is normal in size and structure. Venous: The inferior vena cava is dilated in size with greater than 50% respiratory variability, suggesting right atrial pressure of 8 mmHg. IAS/Shunts: No atrial level shunt detected by color flow Doppler. Additional Comments: A device lead is visualized.  LEFT VENTRICLE PLAX 2D LVIDd:         4.30 cm     Diastology LVIDs:         3.50 cm     LV e' medial:    9.37 cm/s LV PW:         1.10 cm     LV E/e' medial:  7.4 LV IVS:        0.80 cm     LV e' lateral:   9.90 cm/s LVOT diam:     2.00 cm  LV E/e' lateral: 7.0 LV SV:         44 LV SV Index:   28 LVOT Area:     3.14 cm  LV Volumes (MOD) LV vol d, MOD A2C: 42.6 ml LV vol d, MOD A4C: 40.4 ml LV vol s, MOD A2C: 15.8 ml LV vol s, MOD A4C: 20.1 ml LV SV MOD A2C:     26.8 ml LV SV MOD A4C:     40.4 ml LV SV MOD BP:      24.7 ml RIGHT VENTRICLE RV S prime:     9.73 cm/s TAPSE  (M-mode): 2.0 cm LEFT ATRIUM             Index        RIGHT ATRIUM           Index LA diam:        4.10 cm 2.59 cm/m   RA Area:     47.10 cm LA Vol (A2C):   83.0 ml 52.45 ml/m  RA Volume:   212.00 ml 133.97 ml/m LA Vol (A4C):   75.4 ml 47.65 ml/m LA Biplane Vol: 86.5 ml 54.66 ml/m  AORTIC VALVE LVOT Vmax:   66.20 cm/s LVOT Vmean:  46.700 cm/s LVOT VTI:    0.139 m  AORTA Ao Root diam: 2.90 cm Ao Asc diam:  2.80 cm MITRAL VALVE               TRICUSPID VALVE MV Area (PHT): 2.26 cm    TR Peak grad:   26.6 mmHg MV Decel Time: 335 msec    TR Vmax:        258.00 cm/s MR Peak grad: 129.5 mmHg MR Mean grad: 89.0 mmHg    SHUNTS MR Vmax:      569.00 cm/s  Systemic VTI:  0.14 m MR Vmean:     443.0 cm/s   Systemic Diam: 2.00 cm MV E velocity: 69.40 cm/s MV A velocity: 24.00 cm/s MV E/A ratio:  2.89 Fransico Him MD Electronically signed by Fransico Him MD Signature Date/Time: 04/07/2021/7:09:54 AM    Final     Assessment & Plan:   Juda was seen today for abdominal pain.  Diagnoses and all orders for this visit:  Direct inguinal hernia of left side- I have ordered an urgent referral to general surgery. -     Ambulatory referral to General Surgery  Essential hypertension, benign- Her blood pressure is well controlled.   I have discontinued [place] Cerise T. Vanlue's cimetidine. I am also having her maintain her acetaminophen, MISC NATURAL PRODUCTS EX, bifidobacterium infantis, furosemide, Propylene Glycol (SYSTANE COMPLETE OP), Homeopathic Products ([employer] EAR PAIN RELIEF OT), sodium chloride, albuterol, diphenoxylate-atropine, clonazePAM, levothyroxine, apixaban, Asmanex (60 Metered Doses), potassium chloride, and mupirocin ointment.  No orders of the defined types were placed in this encounter.    Follow-up: Return if symptoms worsen or fail to improve.  [name], MD

## 2021-07-28 NOTE — Patient Instructions (Signed)
Inguinal Hernia, Adult An inguinal hernia develops when fat or the intestines push through a weak spot in a muscle where the leg meets the lower abdomen (groin). This creates a bulge. This kind of hernia could also be: In the scrotum, if you are female. In folds of skin around the vagina, if you are female. There are three types of inguinal hernias: Hernias that can be pushed back into the abdomen (are reducible). This type rarely causes pain. Hernias that are not reducible (are incarcerated). Hernias that are not reducible and lose their blood supply (are strangulated). This type of hernia requires emergency surgery. What are the causes? This condition is caused by having a weak spot in the muscles or tissues in your groin. This develops over time. The hernia may poke through the weak spot when you suddenly strain your lower abdominal muscles, such as when you: Lift a heavy object. Strain to have a bowel movement. Constipation can lead to straining. Cough. What increases the risk? This condition is more likely to develop in: Males. Pregnant females. People who: Are overweight. Work in jobs that require long periods of standing or heavy lifting. Have had an inguinal hernia before. Smoke or have lung disease. These factors can lead to long-term (chronic) coughing. What are the signs or symptoms? Symptoms may depend on the size of the hernia. Often, a small inguinal hernia has no symptoms. Symptoms of a larger hernia may include: A bulge in the groin area. This is easier to see when standing. It might not be visible when lying down. Pain or burning in the groin. This may get worse when lifting, straining, or coughing. A dull ache or a feeling of pressure in the groin. An unusual bulge in the scrotum, in males. Symptoms of a strangulated inguinal hernia may include: A bulge in your groin that is very painful and tender to the touch. A bulge that turns red or purple. Fever, nausea, and  vomiting. Inability to have a bowel movement or to pass gas. How is this diagnosed? This condition is diagnosed based on your symptoms, your medical history, and a physical exam. Your health care provider may feel your groin area and ask you to cough. How is this treated? Treatment depends on the size of your hernia and whether you have symptoms. If you do not have symptoms, your health care provider may have you watch your hernia carefully and have you come in for follow-up visits. If your hernia is large or if you have symptoms, you may need surgery to repair the hernia. Follow these instructions at home: Lifestyle Avoid lifting heavy objects. Avoid standing for long periods of time. Do not use any products that contain nicotine or tobacco. These products include cigarettes, chewing tobacco, and vaping devices, such as e-cigarettes. If you need help quitting, ask your health care provider. Maintain a healthy weight. Preventing constipation You may need to take these actions to prevent or treat constipation: Drink enough fluid to keep your urine pale yellow. Take over-the-counter or prescription medicines. Eat foods that are high in fiber, such as beans, whole grains, and fresh fruits and vegetables. Limit foods that are high in fat and processed sugars, such as fried or sweet foods. General instructions You may try to push the hernia back in place by very gently pressing on it while lying down. Do not try to force the bulge back in if it will not push in easily. Watch your hernia for any changes in shape, size, or   color. Get help right away if you notice any changes. Take over-the-counter and prescription medicines only as told by your health care provider. Keep all follow-up visits. This is important. Contact a health care provider if: You have a fever or chills. You develop new symptoms. Your symptoms get worse. Get help right away if: You have pain in your groin that suddenly gets  worse. You have a bulge in your groin that: Suddenly gets bigger and does not get smaller. Becomes red or purple or painful to the touch. You are a man and you have a sudden pain in your scrotum, or the size of your scrotum suddenly changes. You cannot push the hernia back in place by very gently pressing on it when you are lying down. You have nausea or vomiting that does not go away. You have a fast heartbeat. You cannot have a bowel movement or pass gas. These symptoms may represent a serious problem that is an emergency. Do not wait to see if the symptoms will go away. Get medical help right away. Call your local emergency services (911 in the [place] Summary An inguinal hernia develops when fat or the intestines push through a weak spot in a muscle where your leg meets your lower abdomen (groin). This condition is caused by having a weak spot in muscles or tissues in your groin. Symptoms may depend on the size of the hernia, and they may include pain or swelling in your groin. A small inguinal hernia often has no symptoms. Treatment may not be needed if you do not have symptoms. If you have symptoms or a large hernia, you may need surgery to repair the hernia. Avoid lifting heavy objects. Also, avoid standing for long periods of time. This information is not intended to replace advice given to you by your health care provider. Make sure you discuss any questions you have with your health care provider. Document Revised: 09/25/2019 Document Reviewed: 09/25/2019 Elsevier Patient Education  2023 Elsevier Inc.  

## 2021-07-29 NOTE — Progress Notes (Signed)
Remote pacemaker transmission.   

## 2021-08-07 ENCOUNTER — Telehealth: Payer: Self-pay | Admitting: Internal Medicine

## 2021-08-07 NOTE — Telephone Encounter (Signed)
She states the Asthmanex was doing fine, she used the ventolin HFA and her breathing was better.  She noticed this for a day and a half.  She was only using the Asthmanex 1 puff twice daily and she was still sob.  Educated her to use 2 puffs one time a day, around the same time of day and to only use the ventolin if she has sob or wheezing.  Advised to call back Monday if using it as prescribed consistently is not helping.  She verbalized understanding.  Nothing further needed.

## 2021-08-10 NOTE — Telephone Encounter (Signed)
Called and spoke with patient who states that she feels like the Asthmanex is not lasting that long. She states that she is doing 2 puffs at night before she goes to bed. She states that its not lasting all day until the next night when she would use it again. She states that she is having to use the rescue inhaler in between. She feels like the Asthmanex is helping but just not lasting.   Please advise

## 2021-08-10 NOTE — Telephone Encounter (Signed)
Called patient but she did not answer. Left message for patient to call back.  

## 2021-08-10 NOTE — Telephone Encounter (Signed)
I think at this point she needs a follow up visit with an APP or myself to discuss further.

## 2021-08-14 ENCOUNTER — Telehealth: Payer: Self-pay | Admitting: Internal Medicine

## 2021-08-14 NOTE — Telephone Encounter (Signed)
Called and spoke with pt and stated to her to not use the asmanex tonight since she used it this morning and for her to resume taking it as she had been taking it prior beginning tomorrow 7/8. Pt verbalized understanding. Nothing further needed.

## 2021-08-21 ENCOUNTER — Other Ambulatory Visit: Payer: Self-pay | Admitting: Internal Medicine

## 2021-08-21 DIAGNOSIS — E039 Hypothyroidism, unspecified: Secondary | ICD-10-CM

## 2021-08-25 ENCOUNTER — Telehealth: Payer: Self-pay | Admitting: Internal Medicine

## 2021-08-25 NOTE — Telephone Encounter (Signed)
Left message for patient to call back to schedule Medicare Annual Wellness Visit   Last AWV  06/12/19  Please schedule at anytime with [facility] if patient calls the office back.      Any questions, please call me at [phone]

## 2021-08-27 ENCOUNTER — Telehealth: Payer: Self-pay | Admitting: Internal Medicine

## 2021-08-27 NOTE — Telephone Encounter (Signed)
Patient states no one ever called her in regards to referral appointment for hernia- please call patient and advise.  Thank you

## 2021-08-28 NOTE — Telephone Encounter (Signed)
Per Epic Referral was sent:  [facility] [place], [place] [address] [phone]   Pt has been informed of contact info above. However, she declines referral at this time stating that hernia is not bothering her at this time. She also expressed concerns about her health and age with having surgery.

## 2021-09-03 ENCOUNTER — Telehealth: Payer: Self-pay

## 2021-09-03 ENCOUNTER — Ambulatory Visit: Payer: Medicare Other

## 2021-09-03 NOTE — Telephone Encounter (Signed)
Patient scheduled for AWV-S via phone today at 11:00 am.  Left detailed message on voice mail to call [phone].

## 2021-09-05 ENCOUNTER — Other Ambulatory Visit: Payer: Self-pay | Admitting: Internal Medicine

## 2021-09-05 DIAGNOSIS — I5032 Chronic diastolic (congestive) heart failure: Secondary | ICD-10-CM

## 2021-09-08 ENCOUNTER — Telehealth: Payer: Self-pay | Admitting: Gastroenterology

## 2021-09-08 NOTE — Telephone Encounter (Signed)
Instructions  Loperamide (Imodium) daily, titrated as discussed.    Call back in 2-3 weeks with an update.    Consider stopping dairy/ice cream.

## 2021-09-08 NOTE — Telephone Encounter (Signed)
Left message on machine to call back  

## 2021-09-09 NOTE — Telephone Encounter (Signed)
The pt states that she continues to have diarrhea despite avoiding dairy.  She takes imodium 1/2 tab when she has those episodes and it works for 4-5 days however she then becomes constipated.  She is asking for an appt with Dr Carlean Purl to discuss alternate treatments.  She has been scheduled for 9/12 at 950 am.

## 2021-09-09 NOTE — Telephone Encounter (Signed)
Patient has called you back, patient is leaving her house around 11:15 and is requesting a call back before then. Please advise.

## 2021-09-09 NOTE — Telephone Encounter (Signed)
Left message on machine to call back  

## 2021-09-11 ENCOUNTER — Ambulatory Visit: Payer: Medicare Other | Admitting: Internal Medicine

## 2021-09-14 NOTE — Telephone Encounter (Signed)
The pt wanted to confirm her appt with Dr Carlean Purl for 9/12.  She would like to be called if there are any sooner appts. I will put her on my list if we have anyone cancel she will be called

## 2021-09-14 NOTE — Telephone Encounter (Signed)
Patient called stating that she has some questions that she would like to discuss about her appointment with Dr. Carlean Purl on 9/12. Please advise.

## 2021-09-15 DIAGNOSIS — H16141 Punctate keratitis, right eye: Secondary | ICD-10-CM | POA: Diagnosis not present

## 2021-09-21 ENCOUNTER — Ambulatory Visit (INDEPENDENT_AMBULATORY_CARE_PROVIDER_SITE_OTHER): Payer: Medicare Other | Admitting: Internal Medicine

## 2021-09-21 ENCOUNTER — Encounter: Payer: Self-pay | Admitting: Internal Medicine

## 2021-09-21 VITALS — BP 120/68 | HR 78 | Ht 66.0 in | Wt 107.8 lb

## 2021-09-21 DIAGNOSIS — Z950 Presence of cardiac pacemaker: Secondary | ICD-10-CM

## 2021-09-21 DIAGNOSIS — I442 Atrioventricular block, complete: Secondary | ICD-10-CM

## 2021-09-21 LAB — CUP PACEART INCLINIC DEVICE CHECK
Date Time Interrogation Session: 20230814171327
Implantable Lead Implant Date: 20000921
Implantable Lead Implant Date: 20000921
Implantable Lead Location: 753859
Implantable Lead Location: 753860
Implantable Pulse Generator Implant Date: 20170118

## 2021-09-21 NOTE — Progress Notes (Signed)
[facility] Patient Care Team: [name], MD as PCP - General [name], MD as PCP - Cardiology (Cardiology) [name], MD as PCP - Electrophysiology (Cardiology) [name], MD (Cardiology) [name], [facility] as Pharmacist (Pharmacist)   HPI  Alyssa [name] is a 88 y.o. female seen in follow-up for atrial fibrillation-permanent with bradycardia status post Medtronic pacing.  History Recurrent dyspnea with some HFpEF.  Saw AT 9/22 volume overloaded.  Treated her with a diuretic with significant improvement.   Note sounds like 3/17 echocardiography>>Normal LV function; severe biatrial enlargement; mild RVE; severeTR with moderately elevated pulmonary pressure; small pericardial   effusion.  2/23 echocardiogram demonstrated normal LV function EF 55-60% severe biatrial enlargement pulmonary pressures about 35  Dyspnea on exertion.  No chest pain.  No orthopnea or nocturnal dyspnea.  Some asymmetric left-sided edema particularly with heat??  Recurrent diarrhea, has been attributed to IBS.  Taking Lomotil as well as now Imodium.  She takes it when she "has diarrhea "which occurred as about every 4 days.   Date Cr K Hgb TSH  7/18 1.16  4.9 12.3    5/23 1.23 4.6 12.7 7.23     Past Medical History:  Diagnosis Date   Anxiety    Arthritis    Atrial fibrillation ([place])    pacemaker, chronic anticoag   Breast cancer ([place])    Diverticulosis    Fibroma    inner lips/mouth   GERD (gastroesophageal reflux disease)    Hx of breast cancer    Hyperkalemia    Hypertension    Hypothyroidism    IBS (irritable bowel syndrome)    Internal hemorrhoids    Left inguinal hernia    direct   MVP (mitral valve prolapse)    Osteoporosis    Renal insufficiency    Status post dilation of esophageal narrowing    Thyroid cancer [facility])     Past Surgical History:  Procedure Laterality Date   COLONOSCOPY  10/28/2005   internal hemorrhoids, diverticulosis (same as in  2002 and random bxs negative then)   EP IMPLANTABLE DEVICE N/A 02/26/2015   Procedure: PPM Generator Changeout;  Surgeon: [name], MD;  Location: [facility];  Service: Cardiovascular;  Laterality: N/A;   MASTECTOMY  1982   Bilateral   PACEMAKER INSERTION     THYROIDECTOMY     TONSILLECTOMY     UPPER GASTROINTESTINAL ENDOSCOPY  09/06/2000   normal    Current Outpatient Medications  Medication Sig Dispense Refill   acetaminophen (TYLENOL) 650 MG CR tablet Take 1,300 mg by mouth 2 (two) times daily.     albuterol (VENTOLIN HFA) 108 (90 Base) MCG/ACT inhaler Inhale 2 puffs into the lungs every 6 (six) hours as needed. 18 g 5   apixaban (ELIQUIS) 2.5 MG TABS tablet Take 1 tablet (2.5 mg total) by mouth 2 (two) times daily. 180 tablet 1   bifidobacterium infantis (ALIGN) capsule Take 2 capsules by mouth daily.     clonazePAM (KLONOPIN) 0.5 MG tablet TAKE 1 TABLET(0.5 MG) BY MOUTH TWICE DAILY AS NEEDED FOR ANXIETY 180 tablet 1   diphenoxylate-atropine (LOMOTIL) 2.5-0.025 MG tablet Take 1 tablet by mouth 4 (four) times daily as needed for diarrhea or loose stools. 45 tablet 2   furosemide (LASIX) 40 MG tablet TAKE 1 TABLET BY MOUTH DAILY, MAY TAKE ADDITIONAL 1/2 TABLET AS NEEDED FOR WEIGHT GAIN (Patient taking differently: Take 40 mg by mouth daily. May take  additional 1/2 tablet as needed for weight gain) 90 tablet 3   Homeopathic Products ([employer] EAR PAIN RELIEF OT) Place 3 drops into the left ear as needed (Pain).     levothyroxine (SYNTHROID) 50 MCG tablet TAKE 1 TABLET(50 MCG) BY MOUTH DAILY BEFORE BREAKFAST 90 tablet 0   Loperamide HCl (IMODIUM PO) Take by mouth. 1/2 tab as needed for diarrhea     MISC NATURAL PRODUCTS EX Apply 1 application topically in the morning and at bedtime. CBD cream for back and ankle pain     mometasone (ASMANEX, 60 METERED DOSES,) 220 MCG/ACT inhaler Inhale 2 puffs into the lungs daily. 1 each 5   mupirocin ointment (BACTROBAN) 2 % SMARTSIG:1 Application  Topical 2-3 Times Daily     potassium chloride (KLOR-CON M) 10 MEQ tablet TAKE 1 TABLET(10 MEQ) BY MOUTH DAILY 90 tablet 0   Propylene Glycol (SYSTANE COMPLETE OP) Apply 1 drop to eye as needed (dryness).     sodium chloride (OCEAN) 0.65 % SOLN nasal spray Place 1 spray into both nostrils as needed for congestion.     No current facility-administered medications for this visit.    Allergies  Allergen Reactions   Penicillins Diarrhea    Has IBS (caused diarrhea)    Review of Systems negative except from HPI and PMH  Physical Exam BP 120/68   Pulse 78   Ht '5\' 6"'$  (1.676 m)   Wt 107 lb 12.8 oz (48.9 kg)   SpO2 97%   BMI 17.40 kg/m  Well developed and well nourished in no acute distress HENT normal Neck supple with JVP-flat Clear Device pocket well healed; without hematoma or erythema.  There is no tethering  Regular rate and rhythm, no  gallop No / murmur Abd-soft with active B No Clubbing cyanosis trace  L edema Skin-warm and dry A & Oriented  Grossly normal sensory and motor function  ECG ventricular pacing at 78 Intervals-/16/43     ECG demonstrates ventricular pacing at 70   5 Assessment and  Plan  Complete heart block  Atrial fibrillation   HFpEF    TR and RVE  Syncope  Pacemaker-Medtronic    No interval syncope.  She is euvolemic.  She has treated hypothyroidism.  TSH is low but elevated but will defer this to Dr. [name] when she sees a few months  No bleeding on the Eliquis continue at 2.5 mg daily  With her diarrhea, she is post to see Dr. Carlean Purl about 4 weeks.  We will have her take her Imodium on a regular basis as opposed to every for 5 days as needed when her diarrhea resumes to see if we can need her diarrhea.

## 2021-09-21 NOTE — Patient Instructions (Signed)
Medication Instructions:  Your physician has recommended you make the following change in your medication:   Decrease Immodium to 1/2 tablet by mouth every 3 days.  *If you need a refill on your cardiac medications before your next appointment, please call your pharmacy*   Lab Work: None ordered.  If you have labs (blood work) drawn today and your tests are completely normal, you will receive your results only by: [place] (if you have MyChart) OR A paper copy in the mail If you have any lab test that is abnormal or we need to change your treatment, we will call you to review the results.   Testing/Procedures: None ordered.    Follow-Up: At [facility], you and your health needs are our priority.  As part of our continuing mission to provide you with exceptional heart care, we have created designated Provider Care Teams.  These Care Teams include your primary Cardiologist (physician) and Advanced Practice Providers (APPs -  Physician Assistants and Nurse Practitioners) who all work together to provide you with the care you need, when you need it.  We recommend signing up for the patient portal called "MyChart".  Sign up information is provided on this After Visit Summary.  MyChart is used to connect with patients for Virtual Visits (Telemedicine).  Patients are able to view lab/test results, encounter notes, upcoming appointments, etc.  Non-urgent messages can be sent to your provider as well.   To learn more about what you can do with MyChart, go to [email or web address].    Your next appointment:   12 months with Dr Caryl Comes  Important Information About Sugar

## 2021-10-01 DIAGNOSIS — H18451 Nodular corneal degeneration, right eye: Secondary | ICD-10-CM | POA: Diagnosis not present

## 2021-10-04 NOTE — Progress Notes (Unsigned)
    Subjective:    Patient ID: Alyssa [name], female    DOB: Aug 15, 1924, 88 y.o.   MRN: [id number]      HPI Alyssa [name] is here for No chief complaint on file.    IBS-d, associated with weight loss.  Sees GI and has an appt    Medications and allergies reviewed with patient and updated if appropriate.  Current Outpatient Medications on File Prior to Visit  Medication Sig Dispense Refill   acetaminophen (TYLENOL) 650 MG CR tablet Take 1,300 mg by mouth 2 (two) times daily.     albuterol (VENTOLIN HFA) 108 (90 Base) MCG/ACT inhaler Inhale 2 puffs into the lungs every 6 (six) hours as needed. 18 g 5   apixaban (ELIQUIS) 2.5 MG TABS tablet Take 1 tablet (2.5 mg total) by mouth 2 (two) times daily. 180 tablet 1   bifidobacterium infantis (ALIGN) capsule Take 2 capsules by mouth daily.     clonazePAM (KLONOPIN) 0.5 MG tablet TAKE 1 TABLET(0.5 MG) BY MOUTH TWICE DAILY AS NEEDED FOR ANXIETY 180 tablet 1   diphenoxylate-atropine (LOMOTIL) 2.5-0.025 MG tablet Take 1 tablet by mouth 4 (four) times daily as needed for diarrhea or loose stools. 45 tablet 2   furosemide (LASIX) 40 MG tablet TAKE 1 TABLET BY MOUTH DAILY, MAY TAKE ADDITIONAL 1/2 TABLET AS NEEDED FOR WEIGHT GAIN (Patient taking differently: Take 40 mg by mouth daily. May take additional 1/2 tablet as needed for weight gain) 90 tablet 3   Homeopathic Products ([employer] EAR PAIN RELIEF OT) Place 3 drops into the left ear as needed (Pain).     levothyroxine (SYNTHROID) 50 MCG tablet TAKE 1 TABLET(50 MCG) BY MOUTH DAILY BEFORE BREAKFAST 90 tablet 0   Loperamide HCl (IMODIUM PO) Take by mouth. 1/2 tab as needed for diarrhea     MISC NATURAL PRODUCTS EX Apply 1 application topically in the morning and at bedtime. CBD cream for back and ankle pain     mometasone (ASMANEX, 60 METERED DOSES,) 220 MCG/ACT inhaler Inhale 2 puffs into the lungs daily. 1 each 5   mupirocin ointment (BACTROBAN) 2 % SMARTSIG:1 Application Topical 2-3 Times Daily     potassium  chloride (KLOR-CON M) 10 MEQ tablet TAKE 1 TABLET(10 MEQ) BY MOUTH DAILY 90 tablet 0   Propylene Glycol (SYSTANE COMPLETE OP) Apply 1 drop to eye as needed (dryness).     sodium chloride (OCEAN) 0.65 % SOLN nasal spray Place 1 spray into both nostrils as needed for congestion.     No current facility-administered medications on file prior to visit.    Review of Systems     Objective:  There were no vitals filed for this visit. BP Readings from Last 3 Encounters:  09/21/21 120/68  07/28/21 138/74  07/24/21 120/70   Wt Readings from Last 3 Encounters:  09/21/21 107 lb 12.8 oz (48.9 kg)  07/28/21 108 lb (49 kg)  07/24/21 102 lb (46.3 kg)   There is no height or weight on file to calculate BMI.    Physical Exam         Assessment & Plan:    See Problem List for Assessment and Plan of chronic medical problems.

## 2021-10-05 ENCOUNTER — Ambulatory Visit (INDEPENDENT_AMBULATORY_CARE_PROVIDER_SITE_OTHER): Payer: Medicare Other | Admitting: Internal Medicine

## 2021-10-05 ENCOUNTER — Encounter: Payer: Self-pay | Admitting: Internal Medicine

## 2021-10-05 VITALS — BP 100/60 | HR 60 | Temp 98.2°F | Wt 105.4 lb

## 2021-10-05 DIAGNOSIS — R634 Abnormal weight loss: Secondary | ICD-10-CM | POA: Diagnosis not present

## 2021-10-05 DIAGNOSIS — M7989 Other specified soft tissue disorders: Secondary | ICD-10-CM

## 2021-10-05 DIAGNOSIS — G629 Polyneuropathy, unspecified: Secondary | ICD-10-CM | POA: Diagnosis not present

## 2021-10-05 DIAGNOSIS — K580 Irritable bowel syndrome with diarrhea: Secondary | ICD-10-CM

## 2021-10-05 NOTE — Patient Instructions (Signed)
      Medications changes include :   none     Follow up with GI as scheduled

## 2021-10-13 ENCOUNTER — Ambulatory Visit (INDEPENDENT_AMBULATORY_CARE_PROVIDER_SITE_OTHER): Payer: Medicare Other

## 2021-10-13 DIAGNOSIS — I442 Atrioventricular block, complete: Secondary | ICD-10-CM

## 2021-10-16 DIAGNOSIS — H18451 Nodular corneal degeneration, right eye: Secondary | ICD-10-CM | POA: Diagnosis not present

## 2021-10-16 LAB — CUP PACEART REMOTE DEVICE CHECK
Battery Impedance: 1427 Ohm
Battery Remaining Longevity: 44 mo
Battery Voltage: 2.77 V
Brady Statistic RV Percent Paced: 100 %
Date Time Interrogation Session: 20230905101859
Implantable Lead Implant Date: 20000921
Implantable Lead Implant Date: 20000921
Implantable Lead Location: 753859
Implantable Lead Location: 753860
Implantable Pulse Generator Implant Date: 20170118
Lead Channel Impedance Value: 546 Ohm
Lead Channel Impedance Value: 67 Ohm
Lead Channel Pacing Threshold Amplitude: 0.75 V
Lead Channel Pacing Threshold Pulse Width: 0.4 ms
Lead Channel Setting Pacing Amplitude: 2.5 V
Lead Channel Setting Pacing Pulse Width: 0.4 ms
Lead Channel Setting Sensing Sensitivity: 2 mV

## 2021-10-19 NOTE — Progress Notes (Unsigned)
Alyssa [name] 88 y.o. 01-Sep-1924 [id number]  Assessment & Plan:   Encounter Diagnoses  Name Primary?   Rectal mass Yes   Diarrhea, unspecified type    Loss of weight    Long term current use of anticoagulant therapy     Rectal exam suggest a possible rectal cancer, unfortunately.  This may be driving her recent flare of problems including loss of weight.  This will require a colonoscopy.  We have made arrangements for that for 2 days from now she will hold her Eliquis.  There is an increased procedural rare but real risk of stroke in the setting of A-fib on Eliquis, this was explained to the patient.  We have clarified with cardiology and appreciate their assistance and holding the Eliquis without other intervention is acceptable.  Other standard routine risks of colonoscopy were reviewed.  Certainly she is at higher risk of poor outcome should she suffer a complication such as a perforation etc.  May not proceed with total colonoscopy but we need to be prepared for that.  Further plans pending these results.  I did do labs today:  Note that a flex sig in 2018 did not reveal any polyps or mass lesions she had hemorrhoids and diverticulosis.  She reminds me that her father had colon cancer at age 65.   Lab Results  Component Value Date   WBC 4.8 10/20/2021   HGB 13.3 10/20/2021   HCT 40.1 10/20/2021   MCV 95.6 10/20/2021   PLT 163.0 10/20/2021     Chemistry      Component Value Date/Time   NA 142 10/20/2021 1118   NA 143 10/30/2020 1214   K 4.3 10/20/2021 1118   CL 101 10/20/2021 1118   CO2 30 10/20/2021 1118   BUN 29 (H) 10/20/2021 1118   BUN 31 10/30/2020 1214   CREATININE 1.16 10/20/2021 1118   CREATININE 1.10 (H) 02/20/2015 1447      Component Value Date/Time   CALCIUM 9.5 10/20/2021 1118   ALKPHOS 68 10/20/2021 1118   AST 23 10/20/2021 1118   ALT 18 10/20/2021 1118   BILITOT 0.6 10/20/2021 1118         Subjective:   Chief Complaint:  HPI 88 year old  white woman with multiple medical problems here for follow-up of IBS, in the setting of complete heart block, A-fib, pacemaker insertion, breast cancer, and thyroid cancer.  Seen in June 2023 by Alonza Bogus, PA-C  ASSESSMENT AND PLAN: *88 year old female with history of IBS-D.  Not seen here since 2017.  She has been having a time with her diarrhea again lately. Was treated with Bactrim by her PCP and just completed that.  Maybe some improvement.  She has also been eating very bland/brat diet.  Her weight is down about 10 to 11 pounds compared to earlier this year.  She has Imodium at home that she has been using intermittently.  I told her she could certainly use it on a daily basis starting with a half a tablet every morning and titrating further as needed from there, being sure that she does not get constipated.  We also discussed dietary measures, I told her that if she is eating a lot of salads and fruits and vegetables then her stools may be looser.  Sounds like there may be a component of lactose intolerance since she does eat ice cream often so she will try to cut that out for a while.  She will call us back with an  update on her symptoms in 2 to 3 weeks.  If worsening again or still no improvement then may need to consider stool studies and CT scan especially in light of the weight loss.  Other treatment options would be to retry Lomotil, pancreatic enzymes..  She is really not have any abdominal pain so would probably try to avoid antispasmodics in her age group.  Apparently Zofran had been mentioned as it has shown success in helping with diarrhea predominant irritable bowel syndrome.  Subsequent to this, she saw Dr. Caryl Comes about a month ago, for one of her cardiology/EP follow-ups.  He suggested that she try taking her Imodium, half tab about every 3 days.  She is doing that and feels like her stools have formed up more though she still has loose stools.  Actually "brown water" at times.  Not oily  or greasy.  Her weight seems to have leveled out though she has lost at least 15 pounds in the last several months.  She had been on largely a brat diet, though she tolerates some vegetables at times she indicates that she had some green beans that caused quite a problem recently, with diarrhea.  She actually does not move her bowels for several days and feels constipated at times also.  She does not describe any rectal bleeding.  She has had about 2 episodes of incontinence over the past several months.  There is occasional mild right lower quadrant pain but nothing severe.  She performs her activities of daily living.  She denies any significant dyspnea or chest pain or cardiac issues.  Echocardiogram 04/06/2021 Left ventricular ejection fraction, by estimation, is 55 to 60%. The left ventricle has normal function. The left ventricle has no regional wall motion abnormalities. Left ventricular diastolic parameters were normal. There is hypokinesis of the left ventricular, apical segment Right ventricular systolic function is normal. The right ventricular size is mildly enlarged. There is normal pulmonary artery systolic pressure. The estimated right ventricular systolic pressure is 94.4 mmHg.  Left atrial size was severely dilated.  Right atrial size was severely dilated. A small pericardial effusion is present. The pericardial effusion is anterior to the right ventricle. The mitral valve is normal in structure. Mild mitral valve regurgitation. No evidence of mitral stenosis. Tricuspid valve regurgitation is severe. The aortic valve is normal in structure. Aortic valve regurgitation is not visualized. No aortic stenosis is present. The inferior vena cava is dilated Wt Readings from Last 3 Encounters:  10/20/21 106 lb 12.8 oz (48.4 kg)  10/05/21 105 lb 6 oz (47.8 kg)  09/21/21 107 lb 12.8 oz (48.9 kg)  05/2021 115# 03/2021 124# Lab Results  Component Value Date   WBC 4.8 10/20/2021   HGB  13.3 10/20/2021   HCT 40.1 10/20/2021   MCV 95.6 10/20/2021   PLT 163.0 10/20/2021    Lab Results  Component Value Date   FERRITIN 171.8 07/08/2021     Allergies  Allergen Reactions   Penicillins Diarrhea    Has IBS (caused diarrhea)   Current Meds  Medication Sig   acetaminophen (TYLENOL) 650 MG CR tablet Take 1,300 mg by mouth 2 (two) times daily.   albuterol (VENTOLIN HFA) 108 (90 Base) MCG/ACT inhaler Inhale 2 puffs into the lungs every 6 (six) hours as needed.   apixaban (ELIQUIS) 2.5 MG TABS tablet Take 1 tablet (2.5 mg total) by mouth 2 (two) times daily.   bifidobacterium infantis (ALIGN) capsule Take 2 capsules by mouth daily.   clonazePAM (  KLONOPIN) 0.5 MG tablet TAKE 1 TABLET(0.5 MG) BY MOUTH TWICE DAILY AS NEEDED FOR ANXIETY   furosemide (LASIX) 40 MG tablet TAKE 1 TABLET BY MOUTH DAILY, MAY TAKE ADDITIONAL 1/2 TABLET AS NEEDED FOR WEIGHT GAIN (Patient taking differently: Take 40 mg by mouth daily. May take additional 1/2 tablet as needed for weight gain)   Homeopathic Products ([employer] EAR PAIN RELIEF OT) Place 3 drops into the left ear as needed (Pain).   levothyroxine (SYNTHROID) 50 MCG tablet TAKE 1 TABLET(50 MCG) BY MOUTH DAILY BEFORE BREAKFAST   Loperamide HCl (IMODIUM PO) Take by mouth. 1/2 tab as needed for diarrhea   MISC NATURAL PRODUCTS EX Apply 1 application topically in the morning and at bedtime. CBD cream for back and ankle pain   mometasone (ASMANEX, 60 METERED DOSES,) 220 MCG/ACT inhaler Inhale 2 puffs into the lungs daily.   mupirocin ointment (BACTROBAN) 2 % SMARTSIG:1 Application Topical 2-3 Times Daily   potassium chloride (KLOR-CON M) 10 MEQ tablet TAKE 1 TABLET(10 MEQ) BY MOUTH DAILY   Propylene Glycol (SYSTANE COMPLETE OP) Apply 1 drop to eye as needed (dryness).   sodium chloride (OCEAN) 0.65 % SOLN nasal spray Place 1 spray into both nostrils as needed for congestion.   [DISCONTINUED] diphenoxylate-atropine (LOMOTIL) 2.5-0.025 MG tablet Take 1  tablet by mouth 4 (four) times daily as needed for diarrhea or loose stools.   Past Medical History:  Diagnosis Date   Anxiety    Arthritis    Atrial fibrillation ([place])    pacemaker, chronic anticoag   Breast cancer ([place])    Diverticulosis    Fibroma    inner lips/mouth   GERD (gastroesophageal reflux disease)    Hx of breast cancer    Hyperkalemia    Hypertension    Hypothyroidism    IBS (irritable bowel syndrome)    Internal hemorrhoids    Left inguinal hernia    direct   MVP (mitral valve prolapse)    Osteoporosis    Renal insufficiency    Status post dilation of esophageal narrowing    Thyroid cancer [facility])    Past Surgical History:  Procedure Laterality Date   COLONOSCOPY  10/28/2005   internal hemorrhoids, diverticulosis (same as in 2002 and random bxs negative then)   EP IMPLANTABLE DEVICE N/A 02/26/2015   Procedure: PPM Generator Changeout;  Surgeon: [name], MD;  Location: [facility];  Service: Cardiovascular;  Laterality: N/A;   MASTECTOMY  1982   Bilateral   PACEMAKER INSERTION     THYROIDECTOMY     TONSILLECTOMY     UPPER GASTROINTESTINAL ENDOSCOPY  09/06/2000   normal   Social History   Social History Narrative   Regular Exercise: Yes   Daily Caffeine Use: Sometimes.            family history includes Arthritis in an other family member; Colon cancer (age of onset: 74) in her father; Heart disease in her father; Hypertension in an other family member; Lung cancer in her son; Stroke in her mother.   Review of Systems As per HPI  Objective:   Physical Exam '@BP'$  124/70   Pulse (!) 58   Ht '5\' 6"'$  (1.676 m)   Wt 106 lb 12.8 oz (48.4 kg)   SpO2 95%   BMI 17.24 kg/m @  General:  NAD Eyes:   anicteric Lungs:  clear Heart::  S1S2 no rubs, murmurs or gallops Abdomen:  soft and nontender, BS+ Rectal - irregular, polypoid firm mucosa  c/w rectal mass Ext:   no edema, cyanosis or clubbing    Data Reviewed:  See HPI

## 2021-10-20 ENCOUNTER — Telehealth: Payer: Self-pay

## 2021-10-20 ENCOUNTER — Ambulatory Visit: Payer: Medicare Other | Admitting: Internal Medicine

## 2021-10-20 ENCOUNTER — Encounter: Payer: Self-pay | Admitting: Internal Medicine

## 2021-10-20 ENCOUNTER — Other Ambulatory Visit (INDEPENDENT_AMBULATORY_CARE_PROVIDER_SITE_OTHER): Payer: Medicare Other

## 2021-10-20 VITALS — BP 124/70 | HR 58 | Ht 66.0 in | Wt 106.8 lb

## 2021-10-20 DIAGNOSIS — K6289 Other specified diseases of anus and rectum: Secondary | ICD-10-CM | POA: Diagnosis not present

## 2021-10-20 DIAGNOSIS — I4891 Unspecified atrial fibrillation: Secondary | ICD-10-CM

## 2021-10-20 DIAGNOSIS — Z7901 Long term (current) use of anticoagulants: Secondary | ICD-10-CM

## 2021-10-20 DIAGNOSIS — R197 Diarrhea, unspecified: Secondary | ICD-10-CM

## 2021-10-20 DIAGNOSIS — R634 Abnormal weight loss: Secondary | ICD-10-CM | POA: Diagnosis not present

## 2021-10-20 LAB — COMPREHENSIVE METABOLIC PANEL
ALT: 18 U/L (ref 0–35)
AST: 23 U/L (ref 0–37)
Albumin: 4.8 g/dL (ref 3.5–5.2)
Alkaline Phosphatase: 68 U/L (ref 39–117)
BUN: 29 mg/dL — ABNORMAL HIGH (ref 6–23)
CO2: 30 mEq/L (ref 19–32)
Calcium: 9.5 mg/dL (ref 8.4–10.5)
Chloride: 101 mEq/L (ref 96–112)
Creatinine, Ser: 1.16 mg/dL (ref 0.40–1.20)
GFR: 40.08 mL/min — ABNORMAL LOW (ref >60.00)
Glucose, Bld: 70 mg/dL (ref 70–99)
Potassium: 4.3 mEq/L (ref 3.5–5.1)
Sodium: 142 mEq/L (ref 135–145)
Total Bilirubin: 0.6 mg/dL (ref 0.2–1.2)
Total Protein: 7.5 g/dL (ref 6.0–8.3)

## 2021-10-20 LAB — CBC WITH DIFFERENTIAL/PLATELET
Basophils Absolute: 0 10*3/uL (ref 0.0–0.1)
Basophils Relative: 0.6 % (ref 0.0–3.0)
Eosinophils Absolute: 0 10*3/uL (ref 0.0–0.7)
Eosinophils Relative: 0.8 % (ref 0.0–5.0)
HCT: 40.1 % (ref 36.0–46.0)
Hemoglobin: 13.3 g/dL (ref 12.0–15.0)
Lymphocytes Relative: 29.7 % (ref 12.0–46.0)
Lymphs Abs: 1.4 10*3/uL (ref 0.7–4.0)
MCHC: 33.2 g/dL (ref 30.0–36.0)
MCV: 95.6 fl (ref 78.0–100.0)
Monocytes Absolute: 0.5 10*3/uL (ref 0.1–1.0)
Monocytes Relative: 10.4 % (ref 3.0–12.0)
Neutro Abs: 2.8 10*3/uL (ref 1.4–7.7)
Neutrophils Relative %: 58.5 % (ref 43.0–77.0)
Platelets: 163 10*3/uL (ref 150.0–400.0)
RBC: 4.19 Mil/uL (ref 3.87–5.11)
RDW: 12.7 % (ref 11.5–15.5)
WBC: 4.8 10*3/uL (ref 4.0–10.5)

## 2021-10-20 NOTE — Telephone Encounter (Signed)
[facility] Pre-operative Risk Assessment     Request for surgical clearance:     Endoscopy Procedure  What type of surgery is being performed?     colonoscopy  When is this surgery scheduled?     10/22/2021  What type of clearance is required ?   Pharmacy  Are there any medications that need to be held prior to surgery and how long? Eliquis, 2 days  Practice name and name of physician performing surgery?      [facility]  What is your office phone and fax number?      Phone- [phone]  [phone]  Anesthesia type (None, local, MAC, general) ?       MAC

## 2021-10-20 NOTE — Telephone Encounter (Signed)
Attempted to call patient to clear her over the phone and to ensure that she has instructions regarding holding Eliquis. Left message for her to call back.

## 2021-10-20 NOTE — Patient Instructions (Signed)
Your provider has requested that you go to the basement level for lab work before leaving today. Press "B" on the elevator. The lab is located at the first door on the left as you exit the elevator. ? ?Due to recent changes in healthcare laws, you may see the results of your imaging and laboratory studies on MyChart before your provider has had a chance to review them.  We understand that in some cases there may be results that are confusing or concerning to you. Not all laboratory results come back in the same time frame and the provider may be waiting for multiple results in order to interpret others.  Please give us 48 hours in order for your provider to thoroughly review all the results before contacting the office for clarification of your results.  ? ?You have been scheduled for a colonoscopy. Please follow written instructions given to you at your visit today.  ?Please pick up your prep supplies at the pharmacy within the next 1-3 days. ?If you use inhalers (even only as needed), please bring them with you on the day of your procedure. ? ?I appreciate the opportunity to care for you. ?Carl Gessner, MD, FACG ?

## 2021-10-20 NOTE — Telephone Encounter (Signed)
Patient with diagnosis of A Fib on Eliquis for anticoagulation.    Procedure: colonoscopy Date of procedure: 10/22/21   CHA2DS2-VASc Score = 5  This indicates a 7.2% annual risk of stroke. The patient's score is based upon: CHF History: 1 HTN History: 1 Diabetes History: 0 Stroke History: 0 Vascular Disease History: 0 Age Score: 2 Gender Score: 1    CrCl 22 mL/min Platelet count 163K  Per office protocol, patient can hold Eliquis for 2 days prior to procedure.    **This guidance is not considered finalized until pre-operative APP has relayed final recommendations.**

## 2021-10-21 NOTE — Telephone Encounter (Signed)
Follow Up:     Patient is returning a call from [place] from yesterday.

## 2021-10-21 NOTE — Telephone Encounter (Signed)
   Patient Name: Alyssa [name]  DOB: Apr 07, 1924 MRN: [id number]  Primary Cardiologist: Virl Axe, MD  Chart reviewed as part of pre-operative protocol coverage. Given past medical history and time since last visit, based on ACC/AHA guidelines, [name] would be at acceptable risk for the planned procedure without further cardiovascular testing.   The patient was advised that if she develops new symptoms prior to surgery to contact our office to arrange for a follow-up visit, and she verbalized understanding.  Patient with diagnosis of A Fib on Eliquis for anticoagulation.     Procedure: colonoscopy Date of procedure: 10/22/21     CHA2DS2-VASc Score = 5  This indicates a 7.2% annual risk of stroke. The patient's score is based upon: CHF History: 1 HTN History: 1 Diabetes History: 0 Stroke History: 0 Vascular Disease History: 0 Age Score: 2 Gender Score: 1     CrCl 22 mL/min Platelet count 163K   Per office protocol, patient can hold Eliquis for 2 days prior to procedure.    I will route this recommendation to the requesting party via Epic fax function and remove from pre-op pool.  Please call with questions.  Lenna Sciara, NP 10/21/2021, 12:25 PM

## 2021-10-22 ENCOUNTER — Encounter: Payer: Self-pay | Admitting: Internal Medicine

## 2021-10-22 ENCOUNTER — Ambulatory Visit (AMBULATORY_SURGERY_CENTER): Payer: Medicare Other | Admitting: Internal Medicine

## 2021-10-22 VITALS — BP 127/79 | HR 59 | Temp 96.8°F | Resp 19 | Ht 66.0 in | Wt 106.0 lb

## 2021-10-22 DIAGNOSIS — K5730 Diverticulosis of large intestine without perforation or abscess without bleeding: Secondary | ICD-10-CM

## 2021-10-22 DIAGNOSIS — K6289 Other specified diseases of anus and rectum: Secondary | ICD-10-CM

## 2021-10-22 DIAGNOSIS — R197 Diarrhea, unspecified: Secondary | ICD-10-CM

## 2021-10-22 DIAGNOSIS — K529 Noninfective gastroenteritis and colitis, unspecified: Secondary | ICD-10-CM

## 2021-10-22 DIAGNOSIS — R634 Abnormal weight loss: Secondary | ICD-10-CM | POA: Diagnosis not present

## 2021-10-22 MED ORDER — SODIUM CHLORIDE 0.9 % IV SOLN: 500.0000 mL | Freq: Once | INTRAVENOUS | Status: DC

## 2021-10-22 NOTE — Progress Notes (Signed)
History and Physical Interval Note:  10/22/2021 1:27 PM  Alyssa [name]  has presented today for endoscopic procedure(s), with the diagnosis of  Encounter Diagnosis  Name Primary?   Rectal mass Yes  .  The various methods of evaluation and treatment have been discussed with the patient and/or family. After consideration of risks, benefits and other options for treatment, the patient has consented to  the endoscopic procedure(s).   The patient's history has been reviewed, patient examined, no change in status, stable for endoscopic procedure(s).  I have reviewed the patient's chart and labs.  Questions were answered to the patient's satisfaction.     Gatha Mayer, MD, [facility]

## 2021-10-22 NOTE — Progress Notes (Signed)
Report to PACU, RN, vss, BBS= Clear.  

## 2021-10-22 NOTE — Op Note (Signed)
[place] Patient Name: Alyssa [name] Procedure Date: 10/22/2021 1:22 PM MRN: [id number] Endoscopist: Gatha Mayer , MD Age: 88 Referring MD:  Date of Birth: 1924/06/25 Gender: Female Account #: [id number] Procedure:                Colonoscopy Indications:              Chronic diarrhea, Rectal mass Medicines:                Monitored Anesthesia Care Procedure:                Pre-Anesthesia Assessment:                           - Prior to the procedure, a History and Physical                            was performed, and patient medications and                            allergies were reviewed. The patient's tolerance of                            previous anesthesia was also reviewed. The risks                            and benefits of the procedure and the sedation                            options and risks were discussed with the patient.                            All questions were answered, and informed consent                            was obtained. Prior Anticoagulants: The patient                            last took Eliquis (apixaban) 2 days prior to the                            procedure. ASA Grade Assessment: III - A patient                            with severe systemic disease. After reviewing the                            risks and benefits, the patient was deemed in                            satisfactory condition to undergo the procedure.                           After obtaining informed consent, the colonoscope  was passed under direct vision. Throughout the                            procedure, the patient's blood pressure, pulse, and                            oxygen saturations were monitored continuously. The                            [id number] PCF-H190TL Slim SB Colonoscope was introduced                            through the anus and advanced to the the terminal                            ileum, with identification of  the appendiceal                            orifice and IC valve. The colonoscopy was performed                            without difficulty. The patient tolerated the                            procedure well. The quality of the bowel                            preparation was good. The terminal ileum, ileocecal                            valve, appendiceal orifice, and rectum were                            photographed. Scope In: 1:36:30 PM Scope Out: 1:50:16 PM Scope Withdrawal Time: 0 hours 8 minutes 20 seconds  Total Procedure Duration: 0 hours 13 minutes 46 seconds  Findings:                 The digital rectal exam findings include decreased                            tone and suggested a mass (nodular changes                            palpated).                           Many small and large-mouthed diverticula were found                            in the entire colon.                           The exam was otherwise without abnormality on  direct and retroflexion views.                           The terminal ileum appeared normal.                           Biopsies for histology were taken with a cold                            forceps from the entire colon for evaluation of                            microscopic colitis. Estimated blood loss was                            minimal. Complications:            No immediate complications. Estimated Blood Loss:     Estimated blood loss was minimal. Impression:               - Decreased tone and suggested a mass (nodular                            changes palpated) found on digital rectal exam.                            Mobile nodular changes in wall of proximal rectum.                            No mass seen - no rectal cancer.                           - Diverticulosis in the entire examined colon.                           - The examination was otherwise normal on direct                            and  retroflexion views.                           - The examined portion of the ileum was normal.                           - Biopsies were taken with a cold forceps from the                            entire colon for evaluation of microscopic colitis. Recommendation:           - Patient has a contact number available for                            emergencies. The signs and symptoms of potential  delayed complications were discussed with the                            patient. Return to normal activities tomorrow.                            Written discharge instructions were provided to the                            patient.                           - Resume previous diet.                           - Continue present medications. Scheduled Imodium                            as she was + other meds.                           - Resume Eliquis (apixaban) at prior dose tomorrow.                           - Await pathology results.                           - Will discuss whether to pursue additional testing                            for abnormal rectal exam - consider CT abd/pelvis                            given weight loss issues also. Gatha Mayer, MD 10/22/2021 2:05:37 PM This report has been signed electronically.

## 2021-10-22 NOTE — Progress Notes (Signed)
Called to room to assist during endoscopic procedure.  Patient ID and intended procedure confirmed with present staff. Received instructions for my participation in the procedure from the performing physician.  

## 2021-10-22 NOTE — Patient Instructions (Addendum)
You do not have a rectal cancer!  The rectal exam is still abnormal in that there are nodules that seem to be in the rectal wall itself. Now that the rectum is empty of stool I can tell that they are not in the rectum but in the wall. They are small and mobile. Not sure what they are or whether we should pursue further.  Lots of diverticulosis as we knew.   I took biopsies of the colon to check for inflammation given the diarrhea issues.  I will contact you with results and plans - it is possible this may not be until October as I will be away x 2 weeks later this month and early October.  Continue with your scheduled Imodium as you have been and resume Eliquis tomorrow.  I appreciate the opportunity to care for you. Gatha Mayer, MD, FACG   YOU HAD AN ENDOSCOPIC PROCEDURE TODAY AT [facility]:   Refer to the procedure report that was given to you for any specific questions about what was found during the examination.  If the procedure report does not answer your questions, please call your gastroenterologist to clarify.  If you requested that your care partner not be given the details of your procedure findings, then the procedure report has been included in a sealed envelope for you to review at your convenience later.  YOU SHOULD EXPECT: Some feelings of bloating in the abdomen. Passage of more gas than usual.  Walking can help get rid of the air that was put into your GI tract during the procedure and reduce the bloating. If you had a lower endoscopy (such as a colonoscopy or flexible sigmoidoscopy) you may notice spotting of blood in your stool or on the toilet paper. If you underwent a bowel prep for your procedure, you may not have a normal bowel movement for a few days.  Please Note:  You might notice some irritation and congestion in your nose or some drainage.  This is from the oxygen used during your procedure.  There is no need for concern and it should clear up in  a day or so.  SYMPTOMS TO REPORT IMMEDIATELY:  Following lower endoscopy (colonoscopy or flexible sigmoidoscopy):  Excessive amounts of blood in the stool  Significant tenderness or worsening of abdominal pains  Swelling of the abdomen that is new, acute  Fever of 100F or higher   For urgent or emergent issues, a gastroenterologist can be reached at any hour by calling [phone]. Do not use MyChart messaging for urgent concerns.    DIET:  We do recommend a small meal at first, but then you may proceed to your regular diet.  Drink plenty of fluids but you should avoid alcoholic beverages for 24 hours.  ACTIVITY:  You should plan to take it easy for the rest of today and you should NOT DRIVE or use heavy machinery until tomorrow (because of the sedation medicines used during the test).    FOLLOW UP: Our staff will call the number listed on your records the next business day following your procedure.  We will call around 7:15- 8:00 am to check on you and address any questions or concerns that you may have regarding the information given to you following your procedure. If we do not reach you, we will leave a message.     If any biopsies were taken you will be contacted by phone or by letter within the next 1-3 weeks.  Please call us at [phone] if you have not heard about the biopsies in 3 weeks.    SIGNATURES/CONFIDENTIALITY: You and/or your care partner have signed paperwork which will be entered into your electronic medical record.  These signatures attest to the fact that that the information above on your After Visit Summary has been reviewed and is understood.  Full responsibility of the confidentiality of this discharge information lies with you and/or your care-partner.

## 2021-10-23 ENCOUNTER — Telehealth: Payer: Self-pay | Admitting: *Deleted

## 2021-10-23 NOTE — Telephone Encounter (Signed)
  Follow up Call-     10/22/2021   12:33 PM  Call back number  Post procedure Call Back phone  # [phone]  Permission to leave phone message Yes     Patient questions:  Do you have a fever, pain , or abdominal swelling? No. Pain Score  0 *  Have you tolerated food without any problems? Yes.    Have you been able to return to your normal activities? Yes.    Do you have any questions about your discharge instructions: Diet   No. Medications  No. Follow up visit  No.  Do you have questions or concerns about your Care? No.  Actions: * If pain score is 4 or above: No action needed, pain <4.

## 2021-10-28 ENCOUNTER — Other Ambulatory Visit: Payer: Self-pay

## 2021-10-28 DIAGNOSIS — R634 Abnormal weight loss: Secondary | ICD-10-CM

## 2021-11-02 ENCOUNTER — Other Ambulatory Visit: Payer: Self-pay | Admitting: Internal Medicine

## 2021-11-02 DIAGNOSIS — F411 Generalized anxiety disorder: Secondary | ICD-10-CM

## 2021-11-03 ENCOUNTER — Telehealth: Payer: Self-pay

## 2021-11-03 NOTE — Telephone Encounter (Signed)
Patient is requesting a call back.

## 2021-11-04 ENCOUNTER — Ambulatory Visit (HOSPITAL_COMMUNITY)
Admission: RE | Admit: 2021-11-04 | Discharge: 2021-11-04 | Disposition: A | Discharge status: Home / Self Care | Payer: Medicare Other | Source: Ambulatory Visit | Attending: Internal Medicine | Admitting: Internal Medicine

## 2021-11-04 DIAGNOSIS — R634 Abnormal weight loss: Secondary | ICD-10-CM | POA: Insufficient documentation

## 2021-11-04 DIAGNOSIS — N2889 Other specified disorders of kidney and ureter: Secondary | ICD-10-CM | POA: Diagnosis not present

## 2021-11-04 MED ORDER — IOHEXOL 9 MG/ML PO SOLN
1000.0000 mL | Freq: Once | ORAL | Status: AC
  Administered 2021-11-04: 1000 mL via ORAL

## 2021-11-04 MED ORDER — IOHEXOL 300 MG/ML  SOLN
80.0000 mL | Freq: Once | INTRAMUSCULAR | Status: AC | PRN
  Administered 2021-11-04: 80 mL via INTRAVENOUS

## 2021-11-04 MED ORDER — SODIUM CHLORIDE (PF) 0.9 % IJ SOLN
INTRAMUSCULAR | Status: AC
  Filled 2021-11-04: qty 50

## 2021-11-04 NOTE — Telephone Encounter (Signed)
Spoke with Pt. Pt wanted to confirm prep instructions for the CT scan:  Pt verbalized understanding with all questions answered.

## 2021-11-04 NOTE — Progress Notes (Signed)
Remote pacemaker transmission.   

## 2021-11-05 ENCOUNTER — Telehealth: Payer: Self-pay | Admitting: Internal Medicine

## 2021-11-05 NOTE — Telephone Encounter (Signed)
PT calls today in regards to recent clonazePAM (KLINOPIN) 0.5 MG RX. PT's pharmacy is currently out of this medication and PT was notified that they do have it available at the [facility] on [address]. PT is requesting we send the pharmacy there if possible.   This pharmacy is not currently on PT's preferred pharmacy list.  CB if needed: [phone]

## 2021-11-06 ENCOUNTER — Other Ambulatory Visit: Payer: Self-pay | Admitting: Internal Medicine

## 2021-11-06 DIAGNOSIS — F411 Generalized anxiety disorder: Secondary | ICD-10-CM

## 2021-11-06 MED ORDER — CLONAZEPAM 0.5 MG PO TABS: 0.5000 mg | ORAL_TABLET | Freq: Two times a day (BID) | ORAL | 1 refills | Status: DC | PRN

## 2021-11-06 NOTE — Telephone Encounter (Signed)
PT calls today in regards to this clonazePAM (KLONOPIN) RX. I had let PT know that it might be taking a bit longer since the RX was having to be re-written to another pharmacy and she gave understanding.  She is needing this RX sent to the [facility] on [address] as her usual pharmacy is currently out.  CB if needed: [phone]

## 2021-11-09 NOTE — Telephone Encounter (Signed)
PT calls today in regards to this matter. PT was able to get their RX for the clonazePAM [facility]), however they were only able to give her a partial of 10 0.5 MG tablets. PT was suggested by pharmacy to talk with her PCP to try for an alternative to the clonazePAM. With the shortages at several of the pharmacy's she's used she wants to know what her options are. If Dr.Jones already has an alternative in mind PT would like this sent to her original [facility] ([address]).  CB: [phone]

## 2021-11-11 ENCOUNTER — Other Ambulatory Visit: Payer: Self-pay | Admitting: Internal Medicine

## 2021-11-11 DIAGNOSIS — F411 Generalized anxiety disorder: Secondary | ICD-10-CM

## 2021-11-11 MED ORDER — DIAZEPAM 2 MG PO TABS: 2.0000 mg | ORAL_TABLET | Freq: Three times a day (TID) | ORAL | 1 refills | Status: DC | PRN

## 2021-11-11 NOTE — Telephone Encounter (Signed)
Pt has called to check the status of her RX. She has not heard back from anyone and would like Dr. Stevenson Clinch nurse to call her with an update about when her medication will be available, or if there is another medication that can be prescribed to her.   Please call her back with an update: Brittanyann: [phone]

## 2021-11-12 ENCOUNTER — Telehealth: Payer: Self-pay

## 2021-11-12 NOTE — Telephone Encounter (Signed)
Key: [id number]

## 2021-11-12 NOTE — Telephone Encounter (Signed)
Approved Effective from 11/12/2021 through 11/13/2022

## 2021-11-17 ENCOUNTER — Telehealth: Payer: Self-pay | Admitting: Internal Medicine

## 2021-11-17 NOTE — Telephone Encounter (Signed)
Patient calling you back states she is highly concerned about her weight loss, this morning she said she is at 101 lbs. Requested a call back as soon as possible.

## 2021-11-17 NOTE — Telephone Encounter (Signed)
Pt stated that she got on the scales this AM and it read 101: Pt stated that she is eating well, no diarrhea in three days. Appetite is OK. Pt was encouraged that she is eating well and that she is not having any diarrhea. Pt was informed that the scales can fluctuate very easily throughout the day. Pt notified to weigh the same time each day. Pt notified to call our office is she continues to drop or has any concerns: Pt verbalized understanding with all questions answered.

## 2021-11-19 ENCOUNTER — Encounter: Payer: Self-pay | Admitting: Internal Medicine

## 2021-11-19 DIAGNOSIS — B351 Tinea unguium: Secondary | ICD-10-CM | POA: Diagnosis not present

## 2021-11-19 DIAGNOSIS — Z85828 Personal history of other malignant neoplasm of skin: Secondary | ICD-10-CM | POA: Diagnosis not present

## 2021-11-20 ENCOUNTER — Other Ambulatory Visit: Payer: Self-pay | Admitting: Internal Medicine

## 2021-11-20 DIAGNOSIS — E039 Hypothyroidism, unspecified: Secondary | ICD-10-CM

## 2021-11-30 ENCOUNTER — Telehealth: Payer: Self-pay | Admitting: Internal Medicine

## 2021-11-30 DIAGNOSIS — I4821 Permanent atrial fibrillation: Secondary | ICD-10-CM

## 2021-11-30 MED ORDER — APIXABAN 2.5 MG PO TABS: 2.5000 mg | ORAL_TABLET | Freq: Two times a day (BID) | ORAL | 1 refills | Status: DC

## 2021-11-30 NOTE — Telephone Encounter (Signed)
*  STAT* If patient is at the pharmacy, call can be transferred to refill team.   1. Which medications need to be refilled? (please list name of each medication and dose if known) apixaban (ELIQUIS) 2.5 MG TABS tablet  2. Which pharmacy/location (including street and city if local pharmacy) is medication to be sent to? [facility] [id number] - [place], [place]  3. Do they need a 30 day or 90 day supply? Philip

## 2021-11-30 NOTE — Telephone Encounter (Signed)
Prescription refill request for Eliquis received. Indication: Afib  Last office visit: 09/21/21 Caryl Comes)  Scr: 1.23 (07/08/21)  Age: 88 Weight: 48.1kg  Appropriate dose and refill sent to requested pharmacy.

## 2021-12-06 ENCOUNTER — Other Ambulatory Visit: Payer: Self-pay | Admitting: Internal Medicine

## 2021-12-06 DIAGNOSIS — I5032 Chronic diastolic (congestive) heart failure: Secondary | ICD-10-CM

## 2021-12-08 ENCOUNTER — Telehealth: Payer: Self-pay | Admitting: Internal Medicine

## 2021-12-08 NOTE — Telephone Encounter (Signed)
Pt is having problems with a hernia on her left side again, said the pain has come back and is getting worse.   Pt states she has been dealing with these problems for several months, and thought the issue was getting better but now has flared up again.   Pt would like a call from someone in the clinical staff. Please call Emberlee at:   [phone]

## 2021-12-08 NOTE — Telephone Encounter (Signed)
Pt scheduled for Mon 11/6 @ 9.20am to discuss

## 2021-12-10 DIAGNOSIS — H6123 Impacted cerumen, bilateral: Secondary | ICD-10-CM | POA: Diagnosis not present

## 2021-12-14 ENCOUNTER — Ambulatory Visit (INDEPENDENT_AMBULATORY_CARE_PROVIDER_SITE_OTHER): Payer: Medicare Other | Admitting: Internal Medicine

## 2021-12-14 ENCOUNTER — Encounter: Payer: Self-pay | Admitting: Internal Medicine

## 2021-12-14 VITALS — BP 148/86 | HR 87 | Temp 97.9°F | Resp 16 | Ht 66.0 in | Wt 105.0 lb

## 2021-12-14 DIAGNOSIS — I10 Essential (primary) hypertension: Secondary | ICD-10-CM

## 2021-12-14 DIAGNOSIS — E039 Hypothyroidism, unspecified: Secondary | ICD-10-CM | POA: Diagnosis not present

## 2021-12-14 DIAGNOSIS — K4090 Unilateral inguinal hernia, without obstruction or gangrene, not specified as recurrent: Secondary | ICD-10-CM

## 2021-12-14 LAB — TSH: TSH: 5.85 u[IU]/mL — ABNORMAL HIGH (ref 0.35–5.50)

## 2021-12-14 NOTE — Progress Notes (Unsigned)
Subjective:  Patient ID: Alyssa [name], female    DOB: May 15, 1924  Age: 88 y.o. MRN: [id number]  CC: No chief complaint on file.   HPI Alyssa [name] presents for ***  Outpatient Medications Prior to Visit  Medication Sig Dispense Refill   acetaminophen (TYLENOL) 650 MG CR tablet Take 1,300 mg by mouth 2 (two) times daily.     albuterol (VENTOLIN HFA) 108 (90 Base) MCG/ACT inhaler Inhale 2 puffs into the lungs every 6 (six) hours as needed. 18 g 5   apixaban (ELIQUIS) 2.5 MG TABS tablet Take 1 tablet (2.5 mg total) by mouth 2 (two) times daily. 180 tablet 1   bifidobacterium infantis (ALIGN) capsule Take 2 capsules by mouth daily.     diazepam (VALIUM) 2 MG tablet Take 1 tablet (2 mg total) by mouth every 8 (eight) hours as needed for anxiety. 270 tablet 1   furosemide (LASIX) 40 MG tablet TAKE 1 TABLET BY MOUTH DAILY, MAY TAKE ADDITIONAL 1/2 TABLET AS NEEDED FOR WEIGHT GAIN (Patient taking differently: Take 40 mg by mouth daily. May take additional 1/2 tablet as needed for weight gain) 90 tablet 3   Homeopathic Products ([employer] EAR PAIN RELIEF OT) Place 3 drops into the left ear as needed (Pain).     levothyroxine (SYNTHROID) 50 MCG tablet TAKE 1 TABLET(50 MCG) BY MOUTH DAILY BEFORE BREAKFAST 90 tablet 0   Loperamide HCl (IMODIUM PO) Take by mouth. 1/2 tab as needed for diarrhea     MISC NATURAL PRODUCTS EX Apply 1 application topically in the morning and at bedtime. CBD cream for back and ankle pain     mometasone (ASMANEX, 60 METERED DOSES,) 220 MCG/ACT inhaler Inhale 2 puffs into the lungs daily. 1 each 5   mupirocin ointment (BACTROBAN) 2 %      potassium chloride (KLOR-CON M) 10 MEQ tablet TAKE 1 TABLET(10 MEQ) BY MOUTH DAILY 90 tablet 0   Propylene Glycol (SYSTANE COMPLETE OP) Apply 1 drop to eye as needed (dryness).     sodium chloride (OCEAN) 0.65 % SOLN nasal spray Place 1 spray into both nostrils as needed for congestion.     No facility-administered medications prior to visit.     ROS Review of Systems  Objective:  BP 102/68 (BP Location: Left Arm, Patient Position: Sitting, Cuff Size: Normal)   Pulse 87   Temp 97.9 F (36.6 C) (Oral)   Ht '5\' 6"'$  (1.676 m)   Wt 105 lb (47.6 kg)   SpO2 98%   BMI 16.95 kg/m   BP Readings from Last 3 Encounters:  12/14/21 102/68  10/22/21 127/79  10/20/21 124/70    Wt Readings from Last 3 Encounters:  12/14/21 105 lb (47.6 kg)  10/22/21 106 lb (48.1 kg)  10/20/21 106 lb 12.8 oz (48.4 kg)    Physical Exam  Lab Results  Component Value Date   WBC 4.8 10/20/2021   HGB 13.3 10/20/2021   HCT 40.1 10/20/2021   PLT 163.0 10/20/2021   GLUCOSE 70 10/20/2021   CHOL 155 02/11/2014   TRIG 84.0 02/11/2014   HDL 46.10 02/11/2014   LDLCALC 92 02/11/2014   ALT 18 10/20/2021   AST 23 10/20/2021   NA 142 10/20/2021   K 4.3 10/20/2021   CL 101 10/20/2021   CREATININE 1.16 10/20/2021   BUN 29 (H) 10/20/2021   CO2 30 10/20/2021   TSH 7.23 (H) 07/08/2021   INR 1.3 (A) 03/17/2020   HGBA1C 5.7 06/12/2019    CT  Abdomen Pelvis W Contrast  Result Date: 11/06/2021 CLINICAL DATA:  Unintentional weight loss. EXAM: CT ABDOMEN AND PELVIS WITH CONTRAST TECHNIQUE: Multidetector CT imaging of the abdomen and pelvis was performed using the standard protocol following bolus administration of intravenous contrast. RADIATION DOSE REDUCTION: This exam was performed according to the departmental dose-optimization program which includes automated exposure control, adjustment of the mA and/or kV according to patient size and/or use of iterative reconstruction technique. CONTRAST:  67m OMNIPAQUE IOHEXOL 300 MG/ML  SOLN COMPARISON:  08/29/2014. FINDINGS: Lower chest: Bibasilar scarring. Heart is enlarged. Small pericardial effusion. Trace bilateral pleural effusions. Distal esophagus is grossly unremarkable. Prominent cisterna chyli. Hepatobiliary: 12 mm cyst in the left hepatic lobe. Liver and gallbladder are otherwise unremarkable. No biliary  ductal dilatation. Pancreas: A cleft in body of the pancreas (2/31) simulates a low-attenuation lesion. Otherwise negative. Spleen: Negative. Adrenals/Urinary Tract: Adrenal glands are unremarkable. Subcentimeter low-attenuation lesions in the kidneys are too small to characterize. No specific follow-up necessary. Bladder is grossly unremarkable. Stomach/Bowel: Stomach and small bowel are unremarkable. Appendix is not readily visualized. A fair amount of stool in the colon is indicative of constipation. Vascular/Lymphatic: Atherosclerotic calcification of the aorta. No pathologically enlarged lymph nodes. Reproductive: Uterus is visualized.  No adnexal mass. Other: Small left inguinal hernia contains fluid. Trace pelvic free fluid. Mesenteries and peritoneum grossly unremarkable. Musculoskeletal: Degenerative changes in the spine and hips. Mild dextroconvex curvature, centered at L3. Old T12 compression fracture. No worrisome lytic or sclerotic lesions. IMPRESSION: 1. No findings to explain the patient's weight loss. 2. Small pericardial effusion. 3. Trace bilateral pleural effusions. 4. Fair amount of stool in the colon is indicative of constipation. 5. Small inguinal hernia contains fluid. 6.  Aortic atherosclerosis (ICD10-I70.0). Electronically Signed   By: MLorin PicketM.D.   On: 11/06/2021 08:10    Assessment & Plan:   There are no diagnoses linked to this encounter. I am having LLavone Nerimaintain her acetaminophen, MISC NATURAL PRODUCTS EX, bifidobacterium infantis, furosemide, Propylene Glycol (SYSTANE COMPLETE OP), Homeopathic Products ([employer] EAR PAIN RELIEF OT), sodium chloride, albuterol, Asmanex (60 Metered Doses), mupirocin ointment, Loperamide HCl (IMODIUM PO), diazepam, levothyroxine, apixaban, and potassium chloride.  No orders of the defined types were placed in this encounter.    Follow-up: No follow-ups on file.  TScarlette Calico MD

## 2021-12-14 NOTE — Patient Instructions (Signed)

## 2021-12-15 MED ORDER — LEVOTHYROXINE SODIUM 50 MCG PO TABS: ORAL_TABLET | ORAL | 1 refills | Status: DC

## 2021-12-18 ENCOUNTER — Ambulatory Visit (INDEPENDENT_AMBULATORY_CARE_PROVIDER_SITE_OTHER): Payer: Medicare Other

## 2021-12-18 VITALS — BP 148/86 | HR 87 | Temp 97.9°F | Ht 66.0 in | Wt 105.0 lb

## 2021-12-18 DIAGNOSIS — Z0000 Encounter for general adult medical examination without abnormal findings: Secondary | ICD-10-CM

## 2021-12-18 NOTE — Patient Instructions (Addendum)
Alyssa [name] , Thank you for taking time to come for your Medicare Wellness Visit. I appreciate your ongoing commitment to your health goals. Please review the following plan we discussed and let me know if I can assist you in the future.   These are the goals we discussed:  Goals      To maintain my health.        This is a list of the screening recommended for you and due dates:  Health Maintenance  Topic Date Due   Zoster (Shingles) Vaccine (1 of 2) Never done   COVID-19 Vaccine (5 - Pfizer risk series) 01/26/2022   Medicare Annual Wellness Visit  12/19/2022   Tetanus Vaccine  07/24/2031   Pneumonia Vaccine  Completed   Flu Shot  Completed   DEXA scan (bone density measurement)  Completed   HPV Vaccine  Aged Out    Advanced directives: Yes  Conditions/risks identified: Yes  Next appointment: Follow up in one year for your annual wellness visit.   Preventive Care 13 Years and Older, Female Preventive care refers to lifestyle choices and visits with your health care provider that can promote health and wellness. What does preventive care include? A yearly physical exam. This is also called an annual well check. Dental exams once or twice a year. Routine eye exams. Ask your health care provider how often you should have your eyes checked. Personal lifestyle choices, including: Daily care of your teeth and gums. Regular physical activity. Eating a healthy diet. Avoiding tobacco and drug use. Limiting alcohol use. Practicing safe sex. Taking low-dose aspirin every day. Taking vitamin and mineral supplements as recommended by your health care provider. What happens during an annual well check? The services and screenings done by your health care provider during your annual well check will depend on your age, overall health, lifestyle risk factors, and family history of disease. Counseling  Your health care provider may ask you questions about your: Alcohol use. Tobacco  use. Drug use. Emotional well-being. Home and relationship well-being. Sexual activity. Eating habits. History of falls. Memory and ability to understand (cognition). Work and work Statistician. Reproductive health. Screening  You may have the following tests or measurements: Height, weight, and BMI. Blood pressure. Lipid and cholesterol levels. These may be checked every 5 years, or more frequently if you are over 29 years old. Skin check. Lung cancer screening. You may have this screening every year starting at age 63 if you have a 30-pack-year history of smoking and currently smoke or have quit within the past 15 years. Fecal occult blood test (FOBT) of the stool. You may have this test every year starting at age 39. Flexible sigmoidoscopy or colonoscopy. You may have a sigmoidoscopy every 5 years or a colonoscopy every 10 years starting at age 43. Hepatitis C blood test. Hepatitis B blood test. Sexually transmitted disease (STD) testing. Diabetes screening. This is done by checking your blood sugar (glucose) after you have not eaten for a while (fasting). You may have this done every 1-3 years. Bone density scan. This is done to screen for osteoporosis. You may have this done starting at age 32. Mammogram. This may be done every 1-2 years. Talk to your health care provider about how often you should have regular mammograms. Talk with your health care provider about your test results, treatment options, and if necessary, the need for more tests. Vaccines  Your health care provider may recommend certain vaccines, such as: Influenza vaccine. This is  recommended every year. Tetanus, diphtheria, and acellular pertussis (Tdap, Td) vaccine. You may need a Td booster every 10 years. Zoster vaccine. You may need this after age 16. Pneumococcal 13-valent conjugate (PCV13) vaccine. One dose is recommended after age 3. Pneumococcal polysaccharide (PPSV23) vaccine. One dose is recommended  after age 64. Talk to your health care provider about which screenings and vaccines you need and how often you need them. This information is not intended to replace advice given to you by your health care provider. Make sure you discuss any questions you have with your health care provider. Document Released: 02/21/2015 Document Revised: 10/15/2015 Document Reviewed: 11/26/2014 Elsevier Interactive Patient Education  2017 [place] Prevention in the Home Falls can cause injuries. They can happen to people of all ages. There are many things you can do to make your home safe and to help prevent falls. What can I do on the outside of my home? Regularly fix the edges of walkways and driveways and fix any cracks. Remove anything that might make you trip as you walk through a door, such as a raised step or threshold. Trim any bushes or trees on the path to your home. Use bright outdoor lighting. Clear any walking paths of anything that might make someone trip, such as rocks or tools. Regularly check to see if handrails are loose or broken. Make sure that both sides of any steps have handrails. Any raised decks and porches should have guardrails on the edges. Have any leaves, snow, or ice cleared regularly. Use sand or salt on walking paths during winter. Clean up any spills in your garage right away. This includes oil or grease spills. What can I do in the bathroom? Use night lights. Install grab bars by the toilet and in the tub and shower. Do not use towel bars as grab bars. Use non-skid mats or decals in the tub or shower. If you need to sit down in the shower, use a plastic, non-slip stool. Keep the floor dry. Clean up any water that spills on the floor as soon as it happens. Remove soap buildup in the tub or shower regularly. Attach bath mats securely with double-sided non-slip rug tape. Do not have throw rugs and other things on the floor that can make you trip. What can I do  in the bedroom? Use night lights. Make sure that you have a light by your bed that is easy to reach. Do not use any sheets or blankets that are too big for your bed. They should not hang down onto the floor. Have a firm chair that has side arms. You can use this for support while you get dressed. Do not have throw rugs and other things on the floor that can make you trip. What can I do in the kitchen? Clean up any spills right away. Avoid walking on wet floors. Keep items that you use a lot in easy-to-reach places. If you need to reach something above you, use a strong step stool that has a grab bar. Keep electrical cords out of the way. Do not use floor polish or wax that makes floors slippery. If you must use wax, use non-skid floor wax. Do not have throw rugs and other things on the floor that can make you trip. What can I do with my stairs? Do not leave any items on the stairs. Make sure that there are handrails on both sides of the stairs and use them. Fix handrails that are  broken or loose. Make sure that handrails are as long as the stairways. Check any carpeting to make sure that it is firmly attached to the stairs. Fix any carpet that is loose or worn. Avoid having throw rugs at the top or bottom of the stairs. If you do have throw rugs, attach them to the floor with carpet tape. Make sure that you have a light switch at the top of the stairs and the bottom of the stairs. If you do not have them, ask someone to add them for you. What else can I do to help prevent falls? Wear shoes that: Do not have high heels. Have rubber bottoms. Are comfortable and fit you well. Are closed at the toe. Do not wear sandals. If you use a stepladder: Make sure that it is fully opened. Do not climb a closed stepladder. Make sure that both sides of the stepladder are locked into place. Ask someone to hold it for you, if possible. Clearly mark and make sure that you can see: Any grab bars or  handrails. First and last steps. Where the edge of each step is. Use tools that help you move around (mobility aids) if they are needed. These include: Canes. Walkers. Scooters. Crutches. Turn on the lights when you go into a dark area. Replace any light bulbs as soon as they burn out. Set up your furniture so you have a clear path. Avoid moving your furniture around. If any of your floors are uneven, fix them. If there are any pets around you, be aware of where they are. Review your medicines with your doctor. Some medicines can make you feel dizzy. This can increase your chance of falling. Ask your doctor what other things that you can do to help prevent falls. This information is not intended to replace advice given to you by your health care provider. Make sure you discuss any questions you have with your health care provider. Document Released: 11/21/2008 Document Revised: 07/03/2015 Document Reviewed: 03/01/2014 Elsevier Interactive Patient Education  2017 [employer].

## 2021-12-18 NOTE — Progress Notes (Signed)
Subjective:   Alyssa [name] is a 88 y.o. female who presents for Medicare Annual (Subsequent) preventive examination.  Review of Systems     Cardiac Risk Factors include: advanced age (>18mn, >>56women);family history of premature cardiovascular disease;hypertension     Objective:    Today's Vitals   12/18/21 1253  BP: (!) 148/86  Pulse: 87  Temp: 97.9 F (36.6 C)  SpO2: 98%  Weight: 105 lb (47.6 kg)  Height: '5\' 6"'$  (1.676 m)  PainSc: 0-No pain   Body mass index is 16.95 kg/m.     12/18/2021    1:24 PM 03/28/2021    4:14 PM 06/12/2019    2:22 PM 02/20/2018    6:15 PM 02/20/2018    2:50 PM 02/12/2018   10:57 AM 03/16/2016    7:16 AM  Advanced Directives  Does Patient Have a Medical Advance Directive? Yes No Yes Yes Yes Yes Yes  Type of [place] will  [place] will [place] Addition[place] will Living will Living will [place] Does patient want to make changes to medical advance directive?   No - Patient declined No - Patient declined     Copy of [place] Chart? No - copy requested  No - copy requested No - copy requested     Would patient like information on creating a medical advance directive?  No - Patient declined         Current Medications (verified) Outpatient Encounter Medications as of 12/18/2021  Medication Sig   acetaminophen (TYLENOL) 650 MG CR tablet Take 1,300 mg by mouth 2 (two) times daily.   albuterol (VENTOLIN HFA) 108 (90 Base) MCG/ACT inhaler Inhale 2 puffs into the lungs every 6 (six) hours as needed.   apixaban (ELIQUIS) 2.5 MG TABS tablet Take 1 tablet (2.5 mg total) by mouth 2 (two) times daily.   bifidobacterium infantis (ALIGN) capsule Take 2 capsules by mouth daily.   diazepam (VALIUM) 2 MG tablet Take 1 tablet (2 mg total) by mouth every 8 (eight) hours as needed for anxiety.   furosemide (LASIX) 40 MG tablet TAKE 1 TABLET  BY MOUTH DAILY, MAY TAKE ADDITIONAL 1/2 TABLET AS NEEDED FOR WEIGHT GAIN (Patient taking differently: Take 40 mg by mouth daily. May take additional 1/2 tablet as needed for weight gain)   Homeopathic Products ([employer] EAR PAIN RELIEF OT) Place 3 drops into the left ear as needed (Pain).   levothyroxine (SYNTHROID) 50 MCG tablet TAKE 1 TABLET(50 MCG) BY MOUTH DAILY BEFORE BREAKFAST   Loperamide HCl (IMODIUM PO) Take by mouth. 1/2 tab as needed for diarrhea   MISC NATURAL PRODUCTS EX Apply 1 application topically in the morning and at bedtime. CBD cream for back and ankle pain   mometasone (ASMANEX, 60 METERED DOSES,) 220 MCG/ACT inhaler Inhale 2 puffs into the lungs daily.   mupirocin ointment (BACTROBAN) 2 %    potassium chloride (KLOR-CON M) 10 MEQ tablet TAKE 1 TABLET(10 MEQ) BY MOUTH DAILY   Propylene Glycol (SYSTANE COMPLETE OP) Apply 1 drop to eye as needed (dryness).   sodium chloride (OCEAN) 0.65 % SOLN nasal spray Place 1 spray into both nostrils as needed for congestion.   No facility-administered encounter medications on file as of 12/18/2021.    Allergies (verified) Penicillins   History: Past Medical History:  Diagnosis Date   Anxiety    Arthritis    Atrial fibrillation (HTerril  pacemaker, chronic anticoag   Breast cancer ([place])    Diverticulosis    Fibroma    inner lips/mouth   GERD (gastroesophageal reflux disease)    Hx of breast cancer    Hyperkalemia    Hypertension    Hypothyroidism    IBS (irritable bowel syndrome)    Internal hemorrhoids    Left inguinal hernia    direct   MVP (mitral valve prolapse)    Osteoporosis    Renal insufficiency    Status post dilation of esophageal narrowing    Thyroid cancer [facility])    Past Surgical History:  Procedure Laterality Date   COLONOSCOPY  10/28/2005   internal hemorrhoids, diverticulosis (same as in 2002 and random bxs negative then)   EP IMPLANTABLE DEVICE N/A 02/26/2015   Procedure: PPM Generator Changeout;   Surgeon: [name], MD;  Location: [facility];  Service: Cardiovascular;  Laterality: N/A;   MASTECTOMY  1982   Bilateral   PACEMAKER INSERTION     THYROIDECTOMY     TONSILLECTOMY     UPPER GASTROINTESTINAL ENDOSCOPY  09/06/2000   normal   Family History  Problem Relation Age of Onset   Stroke Mother    Colon cancer Father 12   Heart disease Father    Arthritis Other    Hypertension Other    Lung cancer Son    Esophageal cancer Neg Hx    Pancreatic cancer Neg Hx    Kidney disease Neg Hx    Liver disease Neg Hx    Diabetes Neg Hx    Rectal cancer Neg Hx    Stomach cancer Neg Hx    Social History   Socioeconomic History   Marital status: Widowed    Spouse name: Not on file   Number of children: 3   Years of education: Not on file   Highest education level: Not on file  Occupational History   Occupation: Retired    Occupation: retired  Tobacco Use   Smoking status: Former    Types: Cigarettes    Quit date: 02/08/1954    Years since quitting: 67.9   Smokeless tobacco: Never  Vaping Use   Vaping Use: Never used  Substance and Sexual Activity   Alcohol use: No    Alcohol/week: 0.0 standard drinks of alcohol   Drug use: No   Sexual activity: Not Currently  Other Topics Concern   Not on file  Social History Narrative   Regular Exercise: Yes   Daily Caffeine Use: Sometimes.            Social Determinants of Health   Financial Resource Strain: Low Risk  (12/18/2021)   Overall Financial Resource Strain (CARDIA)    Difficulty of Paying Living Expenses: Not hard at all  Food Insecurity: No Food Insecurity (12/18/2021)   Hunger Vital Sign    Worried About Running Out of Food in the Last Year: Never true    Ran Out of Food in the Last Year: Never true  Transportation Needs: No Transportation Needs (12/18/2021)   PRAPARE - Hydrologist (Medical): No    Lack of Transportation (Non-Medical): No  Physical Activity: Sufficiently  Active (12/18/2021)   Exercise Vital Sign    Days of Exercise per Week: 5 days    Minutes of Exercise per Session: 30 min  Stress: No Stress Concern Present (12/18/2021)   [place]    Feeling of Stress :  Not at all  Social Connections: Moderately Integrated (12/18/2021)   Social Connection and Isolation Panel [NHANES]    Frequency of Communication with Friends and Family: More than three times a week    Frequency of Social Gatherings with Friends and Family: More than three times a week    Attends Religious Services: More than 4 times per year    Active Member of [employer] or Organizations: Yes    Attends Archivist Meetings: More than 4 times per year    Marital Status: Widowed    Tobacco Counseling Counseling given: Not Answered   Clinical Intake:  Pre-visit preparation completed: Yes  Pain : No/denies pain Pain Score: 0-No pain     BMI - recorded: 16.95 Nutritional Status: BMI <19  Underweight Nutritional Risks: None Diabetes: No  How often do you need to have someone help you when you read instructions, pamphlets, or other written materials from your doctor or pharmacy?: 1 - Never What is the last grade level you completed in school?: Bachelor's Degree; some Graduate work  Diabetic? no  Interpreter Needed?: No  Information entered by :: [employer], LPN.   Activities of Daily Living    12/18/2021    1:25 PM  In your present state of health, do you have any difficulty performing the following activities:  Hearing? 0  Vision? 1  Difficulty concentrating or making decisions? 0  Walking or climbing stairs? 0  Dressing or bathing? 0  Doing errands, shopping? 0  Preparing Food and eating ? N  Using the Toilet? N  In the past six months, have you accidently leaked urine? N  Do you have problems with loss of bowel control? N  Managing your Medications? N  Managing your Finances? N   Housekeeping or managing your Housekeeping? N    Patient Care Team: [name], MD as PCP - General [name], MD as PCP - Cardiology (Cardiology) [name], MD as PCP - Electrophysiology (Cardiology) [name], MD (Cardiology) [name], [facility] as Pharmacist (Pharmacist) Macarthur Critchley, OD as Referring Physician (Optometry)  Indicate any recent Medical Services you may have received from other than [facility] providers in the past year (date may be approximate).     Assessment:   This is a routine wellness examination for [place].  Hearing/Vision screen Hearing Screening - Comments:: Patient wears hearing aids. Vision Screening - Comments:: Wears rx glasses - up to date with routine eye exams with Jon B. Scott, OD.   Dietary issues and exercise activities discussed: Current Exercise Habits: Structured exercise class;Home exercise routine, Type of exercise: walking;Other - see comments (exercise class), Time (Minutes): 30, Frequency (Times/Week): 5, Weekly Exercise (Minutes/Week): 150, Intensity: Moderate   Goals Addressed             This Visit's Progress    To maintain my health.        Depression Screen    12/18/2021   12:56 PM 10/05/2021    2:01 PM 07/08/2021    9:03 AM 03/27/2021   10:10 AM 10/31/2020    3:48 PM 06/12/2019    2:23 PM 03/07/2018    2:01 PM  PHQ 2/9 Scores  PHQ - 2 Score 0 0 0 0 0 0 0  PHQ- 9 Score  0 0        Fall Risk    12/18/2021    1:25 PM 10/05/2021    2:01 PM 10/05/2021    2:00 PM 07/08/2021  9:04 AM 03/27/2021   10:10 AM  Fall Risk   Falls in the past year? 0 0 0 0 0  Number falls in past yr: 0 0 0  0  Injury with Fall? 0 0 0  0  Risk for fall due to : No Fall Risks No Fall Risks No Fall Risks  No Fall Risks  Follow up Falls prevention discussed Falls evaluation completed Falls evaluation completed  Falls evaluation completed    [place]:  Any stairs in or around the home? Yes   If so, are there any without handrails? No  Home free of loose throw rugs in walkways, pet beds, electrical cords, etc? Yes Adequate lighting in your home to reduce risk of falls? Yes   ASSISTIVE DEVICES UTILIZED TO PREVENT FALLS:  Life alert? Yes  Use of a cane, walker or w/c? No  Grab bars in the bathroom? Yes  Shower chair or bench in shower? Yes  Elevated toilet seat or a handicapped toilet? Yes   TIMED UP AND GO:  Was the test performed? Yes .  Length of time to ambulate 10 feet: 10 sec.   Gait steady and fast without use of assistive device  Cognitive Function:        12/18/2021   12:56 PM  6CIT Screen  What Year? 0 points  What month? 0 points  What time? 0 points  Count back from 20 0 points  Months in reverse 0 points  Repeat phrase 0 points  Total Score 0 points    Immunizations Immunization History  Administered Date(s) Administered   COVID-19, mRNA, vaccine(Comirnaty)12 years and older 12/01/2021   Fluad Quad(high Dose 65+) 10/19/2018, 11/13/2019, 11/25/2020   Influenza Split 11/27/2010, 12/23/2011   Influenza Whole 12/13/2008, 12/01/2009   Influenza, High Dose Seasonal PF 12/12/2012, 11/05/2015, 11/25/2016, 10/27/2017, 12/01/2021   Influenza,inj,Quad PF,6+ Mos 11/29/2013, 11/18/2014   PFIZER Comirnaty(Gray Top)Covid-19 Tri-Sucrose Vaccine 12/01/2021   PFIZER(Purple Top)SARS-COV-2 Vaccination 02/27/2019, 03/20/2019, 11/27/2019   Pneumococcal Conjugate-13 02/11/2014   Pneumococcal Polysaccharide-23 04/23/2010, 06/12/2019   Td 04/23/2010   Tdap 02/12/2018   Zoster, Live 08/24/2012    TDAP status: Up to date  Flu Vaccine status: Up to date  Pneumococcal vaccine status: Up to date  Covid-19 vaccine status: Completed vaccines  Qualifies for Shingles Vaccine? Yes   Zostavax completed Yes   Shingrix Completed?: No.    Education has been provided regarding the importance of this vaccine. Patient has been advised to call insurance company to  determine out of pocket expense if they have not yet received this vaccine. Advised may also receive vaccine at local pharmacy or Health Dept. Verbalized acceptance and understanding.  Screening Tests Health Maintenance  Topic Date Due   Zoster Vaccines- Shingrix (1 of 2) Never done   COVID-19 Vaccine (5 - Pfizer risk series) 01/26/2022   Medicare Annual Wellness (AWV)  12/19/2022   TETANUS/TDAP  02/13/2028   Pneumonia Vaccine 7+ Years old  Completed   INFLUENZA VACCINE  Completed   DEXA SCAN  Completed   HPV VACCINES  Aged Out    Health Maintenance  Health Maintenance Due  Topic Date Due   Zoster Vaccines- Shingrix (1 of 2) Never done    Colorectal cancer screening: No longer required.   Mammogram status: No longer required due to age.  Bone Density status: No longer required due to age.  Lung Cancer Screening: (Low Dose CT Chest recommended if Age 54-80 years, 30 pack-year currently  smoking OR have quit w/in 15years.) does not qualify.   Lung Cancer Screening Referral: no  Additional Screening:  Hepatitis C Screening: does not qualify; Completed no  Vision Screening: Recommended annual ophthalmology exams for early detection of glaucoma and other disorders of the eye. Is the patient up to date with their annual eye exam?  Yes  Who is the provider or what is the name of the office in which the patient attends annual eye exams? Bing Quarry Scott, OD. If pt is not established with a provider, would they like to be referred to a provider to establish care? No .   Dental Screening: Recommended annual dental exams for proper oral hygiene  Community Resource Referral / Chronic Care Management: CRR required this visit?  No   CCM required this visit?  No      Plan:     I have personally reviewed and noted the following in the patient's chart:   Medical and social history Use of alcohol, tobacco or illicit drugs  Current medications and supplements including opioid  prescriptions. Patient is not currently taking opioid prescriptions. Functional ability and status Nutritional status Physical activity Advanced directives List of other physicians Hospitalizations, surgeries, and ER visits in previous 12 months Vitals Screenings to include cognitive, depression, and falls Referrals and appointments  In addition, I have reviewed and discussed with patient certain preventive protocols, quality metrics, and best practice recommendations. A written personalized care plan for preventive services as well as general preventive health recommendations were provided to patient.     Sheral Flow, LPN   [id number]   Nurse Notes: N/A

## 2022-01-11 ENCOUNTER — Ambulatory Visit (INDEPENDENT_AMBULATORY_CARE_PROVIDER_SITE_OTHER): Payer: Medicare Other

## 2022-01-11 DIAGNOSIS — I442 Atrioventricular block, complete: Secondary | ICD-10-CM | POA: Diagnosis not present

## 2022-01-11 LAB — CUP PACEART REMOTE DEVICE CHECK
Battery Impedance: 1710 Ohm
Battery Remaining Longevity: 37 mo
Battery Voltage: 2.77 V
Brady Statistic RV Percent Paced: 100 %
Date Time Interrogation Session: 20231204090847
Implantable Lead Connection Status: 753985
Implantable Lead Connection Status: 753985
Implantable Lead Implant Date: 20000921
Implantable Lead Implant Date: 20000921
Implantable Lead Location: 753859
Implantable Lead Location: 753860
Implantable Pulse Generator Implant Date: 20170118
Lead Channel Impedance Value: 588 Ohm
Lead Channel Impedance Value: 67 Ohm
Lead Channel Pacing Threshold Amplitude: 0.75 V
Lead Channel Pacing Threshold Pulse Width: 0.4 ms
Lead Channel Setting Pacing Amplitude: 2.5 V
Lead Channel Setting Pacing Pulse Width: 0.4 ms
Lead Channel Setting Sensing Sensitivity: 2 mV
Zone Setting Status: 755011
Zone Setting Status: 755011

## 2022-01-20 DIAGNOSIS — K4090 Unilateral inguinal hernia, without obstruction or gangrene, not specified as recurrent: Secondary | ICD-10-CM | POA: Diagnosis not present

## 2022-01-20 DIAGNOSIS — R634 Abnormal weight loss: Secondary | ICD-10-CM | POA: Diagnosis not present

## 2022-01-25 ENCOUNTER — Other Ambulatory Visit: Payer: Self-pay

## 2022-01-25 DIAGNOSIS — I5032 Chronic diastolic (congestive) heart failure: Secondary | ICD-10-CM

## 2022-01-25 MED ORDER — FUROSEMIDE 40 MG PO TABS: ORAL_TABLET | ORAL | 2 refills | Status: DC

## 2022-01-28 DIAGNOSIS — H18451 Nodular corneal degeneration, right eye: Secondary | ICD-10-CM | POA: Diagnosis not present

## 2022-01-30 ENCOUNTER — Other Ambulatory Visit: Payer: Self-pay | Admitting: Internal Medicine

## 2022-02-09 ENCOUNTER — Other Ambulatory Visit: Payer: Self-pay | Admitting: Internal Medicine

## 2022-02-09 ENCOUNTER — Telehealth: Payer: Self-pay | Admitting: Internal Medicine

## 2022-02-09 DIAGNOSIS — I5032 Chronic diastolic (congestive) heart failure: Secondary | ICD-10-CM

## 2022-02-09 MED ORDER — FUROSEMIDE 40 MG PO TABS: ORAL_TABLET | ORAL | 0 refills | Status: DC

## 2022-02-09 NOTE — Telephone Encounter (Signed)
Patient called stating that she needs her medication    furosemide (LASIX) 40 MG tablet sent to  [facility] [id number] - [address], [place]  Instead of the [employer]. Patient stated that she does not use [facility] anymore. Best callback number is [phone].

## 2022-02-18 NOTE — Progress Notes (Signed)
Remote pacemaker transmission.   

## 2022-02-20 ENCOUNTER — Other Ambulatory Visit: Payer: Self-pay | Admitting: Internal Medicine

## 2022-02-20 DIAGNOSIS — E039 Hypothyroidism, unspecified: Secondary | ICD-10-CM

## 2022-03-01 ENCOUNTER — Telehealth: Payer: Self-pay | Admitting: Internal Medicine

## 2022-03-01 ENCOUNTER — Other Ambulatory Visit: Payer: Self-pay | Admitting: Internal Medicine

## 2022-03-01 DIAGNOSIS — I5032 Chronic diastolic (congestive) heart failure: Secondary | ICD-10-CM

## 2022-03-01 MED ORDER — POTASSIUM CHLORIDE CRYS ER 10 MEQ PO TBCR: 10.0000 meq | EXTENDED_RELEASE_TABLET | Freq: Every day | ORAL | 1 refills | Status: DC

## 2022-03-01 NOTE — Telephone Encounter (Signed)
Patient called requesting a refill on her medication  potassium chloride (KLOR-CON M) 10 MEQ tablet sent to her pharmacy on file [place] [id number] [place], [place]. Best callback number for patient is [phone].

## 2022-03-02 ENCOUNTER — Telehealth: Payer: Self-pay | Admitting: Internal Medicine

## 2022-03-02 NOTE — Telephone Encounter (Signed)
PT calling for inhaler refill Asmanx. New Pharm: [facility] on [address]  She can be reached @ [phone]

## 2022-03-04 ENCOUNTER — Other Ambulatory Visit: Payer: Self-pay | Admitting: Internal Medicine

## 2022-03-04 DIAGNOSIS — I5032 Chronic diastolic (congestive) heart failure: Secondary | ICD-10-CM

## 2022-03-09 NOTE — Telephone Encounter (Signed)
Talk to patient letting her know f/u appt was over due with ND, nothing further, she was able to pick up refill on asmanex

## 2022-03-12 DIAGNOSIS — H16213 Exposure keratoconjunctivitis, bilateral: Secondary | ICD-10-CM | POA: Diagnosis not present

## 2022-03-23 ENCOUNTER — Telehealth: Payer: Self-pay | Admitting: Internal Medicine

## 2022-03-31 DIAGNOSIS — H524 Presbyopia: Secondary | ICD-10-CM | POA: Diagnosis not present

## 2022-04-01 ENCOUNTER — Ambulatory Visit: Payer: Medicare Other | Admitting: Nurse Practitioner

## 2022-04-07 ENCOUNTER — Encounter: Payer: Self-pay | Admitting: Nurse Practitioner

## 2022-04-07 ENCOUNTER — Ambulatory Visit: Payer: Medicare Other | Admitting: Nurse Practitioner

## 2022-04-07 VITALS — BP 122/70 | HR 83 | Ht 66.0 in | Wt 108.6 lb

## 2022-04-07 DIAGNOSIS — J4530 Mild persistent asthma, uncomplicated: Secondary | ICD-10-CM

## 2022-04-07 NOTE — Progress Notes (Unsigned)
$'@Patient'y$  ID: Alyssa [name], female    DOB: [birth year], 89 y.o.   MRN: [id number]  Chief Complaint  Patient presents with   Follow-up    Pt f/u states that in January she had 3-4 nights she would wake up breathless, but since then she hasn't had any problems    Referring provider: [name], MD  HPI: 89 year old female, former smoker followed for asthma. She is a patient of Dr. Mauricio Po and last seen in office 06/01/2021. Past medical history significant for complete heart block s/p pacemaker, HTN, HFpEF, allergic rhinitis, GERD, IBS, hypothyroid, chronic renal insufficiency, GAD, IDA.  TEST/EVENTS:   06/01/2021: OV with Dr. Shearon Stalls. Feels great with albuterol. Doesn't feel like she needs a maintenance inhaler. Ok to stay off maintenance. Advised to call if albuterol use increases so they can restart maintenance inhaler.   04/07/2022: Today - follow up Patient presents today for overdue follow up. Since she was last seen, she called the office and was restarted on Asmanex due to frequent albuterol use. She has been using this twice daily since. She feels like it has maintained her well, for the most part. She did have some PND last month. Woke up a few nights in a row feeling short winded. She did not use her albuterol. Symptoms resolved quickly. She has not experience this since. Denies any wheezing, chest congestion or cough. She does not feel short winded at baseline. Can complete all ADLs without trouble. She is a little unsure when she should use albuterol and is worried about duplicating medications with her Asmanez plus albuterol.   Allergies  Allergen Reactions   Penicillins Diarrhea    Has IBS (caused diarrhea)    Immunization History  Administered Date(s) Administered   COVID-19, mRNA, vaccine(Comirnaty)12 years and older 11/25/2021   Fluad Quad(high Dose 65+) 10/19/2018, 11/13/2019, 11/25/2020   Influenza Split 11/27/2010, 12/23/2011   Influenza Whole 12/13/2008, 12/01/2009    Influenza, High Dose Seasonal PF 12/12/2012, 11/05/2015, 11/25/2016, 10/27/2017, 12/01/2021   Influenza,inj,Quad PF,6+ Mos 11/29/2013, 11/18/2014   Influenza-Unspecified 11/25/2021   PFIZER Comirnaty(Gray Top)Covid-19 Tri-Sucrose Vaccine 12/01/2021   PFIZER(Purple Top)SARS-COV-2 Vaccination 02/27/2019, 03/20/2019, 11/27/2019   Pneumococcal Conjugate-13 02/11/2014   Pneumococcal Polysaccharide-23 04/23/2010, 06/12/2019   Td 04/23/2010, 07/23/2021   Tdap 02/12/2018   Zoster, Live 08/24/2012    Past Medical History:  Diagnosis Date   Anxiety    Arthritis    Atrial fibrillation ([place])    pacemaker, chronic anticoag   Breast cancer ([place])    Diverticulosis    Fibroma    inner lips/mouth   GERD (gastroesophageal reflux disease)    Hx of breast cancer    Hyperkalemia    Hypertension    Hypothyroidism    IBS (irritable bowel syndrome)    Internal hemorrhoids    Left inguinal hernia    direct   MVP (mitral valve prolapse)    Osteoporosis    Renal insufficiency    Status post dilation of esophageal narrowing    Thyroid cancer ([place])     Tobacco History: Social History   Tobacco Use  Smoking Status Former   Types: Cigarettes   Quit date: 02/08/1954   Years since quitting: 68.2  Smokeless Tobacco Never   Counseling given: Not Answered   Outpatient Medications Prior to Visit  Medication Sig Dispense Refill   acetaminophen (TYLENOL) 650 MG CR tablet Take 1,300 mg by mouth 2 (two) times daily.     albuterol (VENTOLIN HFA) 108 (90 Base)  MCG/ACT inhaler Inhale 2 puffs into the lungs every 6 (six) hours as needed. 18 g 5   apixaban (ELIQUIS) 2.5 MG TABS tablet Take 1 tablet (2.5 mg total) by mouth 2 (two) times daily. 180 tablet 1   ASMANEX, 60 METERED DOSES, 220 MCG/ACT inhaler INHALE 2 PUFFS BY MOUTH INTO LUNGS DAILY 1 each 5   bifidobacterium infantis (ALIGN) capsule Take 2 capsules by mouth daily.     diazepam (VALIUM) 2 MG tablet Take 1 tablet (2 mg total) by mouth every 8  (eight) hours as needed for anxiety. 270 tablet 1   furosemide (LASIX) 40 MG tablet TAKE 1 TABLET BY MOUTH DAILY, MAY TAKE ADDITIONAL 1/2 TABLET AS NEEDED FOR WEIGHT GAIN 135 tablet 0   Homeopathic Products ([employer] EAR PAIN RELIEF OT) Place 3 drops into the left ear as needed (Pain).     levothyroxine (SYNTHROID) 50 MCG tablet TAKE 1 TABLET(50 MCG) BY MOUTH DAILY BEFORE BREAKFAST 90 tablet 1   Loperamide HCl (IMODIUM PO) Take by mouth. 1/2 tab as needed for diarrhea     MISC NATURAL PRODUCTS EX Apply 1 application topically in the morning and at bedtime. CBD cream for back and ankle pain     mupirocin ointment (BACTROBAN) 2 %      potassium chloride (KLOR-CON M) 10 MEQ tablet TAKE 1 TABLET(10 MEQ) BY MOUTH DAILY 90 tablet 1   Propylene Glycol (SYSTANE COMPLETE OP) Apply 1 drop to eye as needed (dryness).     sodium chloride (OCEAN) 0.65 % SOLN nasal spray Place 1 spray into both nostrils as needed for congestion.     No facility-administered medications prior to visit.     Review of Systems:   Constitutional: No weight loss or gain, night sweats, fevers, chills, fatigue, or lassitude. HEENT: No headaches, difficulty swallowing, tooth/dental problems, or sore throat. No sneezing, itching, ear ache, nasal congestion, or post nasal drip CV:  +PND (resolved). No chest pain, orthopnea, swelling in lower extremities, anasarca, dizziness, palpitations, syncope Resp: No shortness of breath with exertion or at rest. No excess mucus or change in color of mucus. No productive or non-productive. No hemoptysis. No wheezing.  No chest wall deformity GI:  No heartburn, indigestion, abdominal pain, nausea, vomiting, diarrhea, change in bowel habits, loss of appetite Skin: No rash, lesions, ulcerations Neuro: No dizziness or lightheadedness.  Psych: +occasional anxiety. No depression. Mood stable.     Physical Exam:  BP 122/70   Pulse 83   Ht '5\' 6"'$  (1.676 m)   Wt 108 lb 9.6 oz (49.3 kg)   SpO2 97%    BMI 17.53 kg/m   GEN: Pleasant, interactive, well-kempt; appears younger than stated age; in no acute distress HEENT:  Normocephalic and atraumatic. PERRLA. Sclera white. Nasal turbinates pink, moist and patent bilaterally. No rhinorrhea present. Oropharynx pink and moist, without exudate or edema. No lesions, ulcerations, or postnasal drip.  NECK:  Supple w/ fair ROM. No JVD present. Normal carotid impulses w/o bruits. Thyroid symmetrical with no goiter or nodules palpated. No lymphadenopathy.   CV: RRR, no m/r/g, no peripheral edema. Pulses intact, +2 bilaterally. No cyanosis, pallor or clubbing. PULMONARY:  Unlabored, regular breathing. Clear bilaterally A&P w/o wheezes/rales/rhonchi. No accessory muscle use.  GI: BS present and normoactive. Soft, non-tender to palpation. No organomegaly or masses detected.  MSK: No erythema, warmth or tenderness. Cap refil <2 sec all extrem. No deformities or joint swelling noted.  Neuro: A/Ox3. No focal deficits noted.   Skin: Warm, no  lesions or rashe Psych: Normal affect and behavior. Judgement and thought content appropriate.     Lab Results:  CBC    Component Value Date/Time   WBC 4.8 10/20/2021 1118   RBC 4.19 10/20/2021 1118   HGB 13.3 10/20/2021 1118   HGB 13.4 10/30/2020 1214   HCT 40.1 10/20/2021 1118   HCT 39.5 10/30/2020 1214   PLT 163.0 10/20/2021 1118   PLT 179 10/30/2020 1214   MCV 95.6 10/20/2021 1118   MCV 93 10/30/2020 1214   MCH 31.4 03/28/2021 1733   MCHC 33.2 10/20/2021 1118   RDW 12.7 10/20/2021 1118   RDW 11.8 10/30/2020 1214   LYMPHSABS 1.4 10/20/2021 1118   LYMPHSABS 2.1 05/24/2017 1537   MONOABS 0.5 10/20/2021 1118   EOSABS 0.0 10/20/2021 1118   EOSABS 0.2 05/24/2017 1537   BASOSABS 0.0 10/20/2021 1118   BASOSABS 0.0 05/24/2017 1537    BMET    Component Value Date/Time   NA 142 10/20/2021 1118   NA 143 10/30/2020 1214   K 4.3 10/20/2021 1118   CL 101 10/20/2021 1118   CO2 30 10/20/2021 1118   GLUCOSE  70 10/20/2021 1118   BUN 29 (H) 10/20/2021 1118   BUN 31 10/30/2020 1214   CREATININE 1.16 10/20/2021 1118   CREATININE 1.10 (H) 02/20/2015 1447   CALCIUM 9.5 10/20/2021 1118   GFRNONAA 40 (L) 03/28/2021 1733   GFRAA 41 (L) 08/15/2019 1454    BNP    Component Value Date/Time   BNP 217.3 (H) 03/28/2021 1733     Imaging:  No results found.       Latest Ref Rng & Units 06/01/2021   12:54 PM  PFT Results  FVC-Pre L 1.80   FVC-Predicted Pre % 84   FVC-Post L 1.97   FVC-Predicted Post % 92   Pre FEV1/FVC % % 70   Post FEV1/FCV % % 61   FEV1-Pre L 1.27   FEV1-Predicted Pre % 80   FEV1-Post L 1.21     No results found for: "NITRICOXIDE"      Assessment & Plan:   Mild asthma Compensated on current regimen. No exacerbations requiring steroids or abx. No hospitalizations. Reviewed the role of her rescue inhaler. She verbalized understanding and feels more comfortable with the purpose of each inhaler she has. Asthma action plan in place.  Patient Instructions  Continue Albuterol inhaler 2 puffs every 6 hours as needed for shortness of breath or wheezing. Notify if symptoms persist despite rescue inhaler/neb use.  Continue Asmanex 2 puffs Twice daily. Brush tongue and rinse mouth afterwards  Asthma action plan: START rescue inhaler up to four times a day for worsening shortness of breath, wheezing and cough. If you symptoms do not improve in 24-48 hours, contact us for steroid course. If your symptoms rapidly worsen, you have trouble talking or extreme difficulty breathing, or you're not getting relief from your nebulizer/rescue, go to the emergency department.  Follow up in 6 months with Dr. Shearon Stalls. If symptoms worsen, please contact office for sooner follow up or seek emergency care.    I spent 28 minutes of dedicated to the care of this patient on the date of this encounter to include pre-visit review of records, face-to-face time with the patient discussing conditions  above, post visit ordering of testing, clinical documentation with the electronic health record, making appropriate referrals as documented, and communicating necessary findings to members of the patients care team.  Clayton Bibles, NP 04/08/2022  Pt aware  and understands NP's role.

## 2022-04-07 NOTE — Patient Instructions (Addendum)
Continue Albuterol inhaler 2 puffs every 6 hours as needed for shortness of breath or wheezing. Notify if symptoms persist despite rescue inhaler/neb use.  Continue Asmanex 2 puffs Twice daily. Brush tongue and rinse mouth afterwards  Asthma action plan: START rescue inhaler up to four times a day for worsening shortness of breath, wheezing and cough. If you symptoms do not improve in 24-48 hours, contact us for steroid course. If your symptoms rapidly worsen, you have trouble talking or extreme difficulty breathing, or you're not getting relief from your nebulizer/rescue, go to the emergency department.  Follow up in 6 months with Dr. Shearon Stalls. If symptoms worsen, please contact office for sooner follow up or seek emergency care.

## 2022-04-08 ENCOUNTER — Encounter: Payer: Self-pay | Admitting: Nurse Practitioner

## 2022-04-08 DIAGNOSIS — J45909 Unspecified asthma, uncomplicated: Secondary | ICD-10-CM | POA: Insufficient documentation

## 2022-04-08 NOTE — Assessment & Plan Note (Signed)
Compensated on current regimen. No exacerbations requiring steroids or abx. No hospitalizations. Reviewed the role of her rescue inhaler. She verbalized understanding and feels more comfortable with the purpose of each inhaler she has. Asthma action plan in place.  Patient Instructions  Continue Albuterol inhaler 2 puffs every 6 hours as needed for shortness of breath or wheezing. Notify if symptoms persist despite rescue inhaler/neb use.  Continue Asmanex 2 puffs Twice daily. Brush tongue and rinse mouth afterwards  Asthma action plan: START rescue inhaler up to four times a day for worsening shortness of breath, wheezing and cough. If you symptoms do not improve in 24-48 hours, contact us for steroid course. If your symptoms rapidly worsen, you have trouble talking or extreme difficulty breathing, or you're not getting relief from your nebulizer/rescue, go to the emergency department.  Follow up in 6 months with Dr. Shearon Stalls. If symptoms worsen, please contact office for sooner follow up or seek emergency care.

## 2022-04-12 ENCOUNTER — Ambulatory Visit (INDEPENDENT_AMBULATORY_CARE_PROVIDER_SITE_OTHER): Payer: Medicare Other

## 2022-04-12 DIAGNOSIS — I442 Atrioventricular block, complete: Secondary | ICD-10-CM | POA: Diagnosis not present

## 2022-04-13 ENCOUNTER — Ambulatory Visit: Payer: Medicare Other | Admitting: Internal Medicine

## 2022-04-13 LAB — CUP PACEART REMOTE DEVICE CHECK
Battery Impedance: 1768 Ohm
Battery Remaining Longevity: 36 mo
Battery Voltage: 2.76 V
Brady Statistic RV Percent Paced: 100 %
Date Time Interrogation Session: 20240305134848
Implantable Lead Connection Status: 753985
Implantable Lead Connection Status: 753985
Implantable Lead Implant Date: 20000921
Implantable Lead Implant Date: 20000921
Implantable Lead Location: 753859
Implantable Lead Location: 753860
Implantable Pulse Generator Implant Date: 20170118
Lead Channel Impedance Value: 526 Ohm
Lead Channel Impedance Value: 67 Ohm
Lead Channel Pacing Threshold Amplitude: 0.875 V
Lead Channel Pacing Threshold Pulse Width: 0.4 ms
Lead Channel Setting Pacing Amplitude: 2.5 V
Lead Channel Setting Pacing Pulse Width: 0.4 ms
Lead Channel Setting Sensing Sensitivity: 2 mV
Zone Setting Status: 755011
Zone Setting Status: 755011

## 2022-04-21 DIAGNOSIS — B351 Tinea unguium: Secondary | ICD-10-CM | POA: Diagnosis not present

## 2022-05-09 ENCOUNTER — Other Ambulatory Visit: Payer: Self-pay | Admitting: Internal Medicine

## 2022-05-09 DIAGNOSIS — I5032 Chronic diastolic (congestive) heart failure: Secondary | ICD-10-CM

## 2022-05-18 NOTE — Progress Notes (Signed)
Remote pacemaker transmission.   

## 2022-05-26 NOTE — Telephone Encounter (Signed)
n

## 2022-05-28 ENCOUNTER — Telehealth: Payer: Self-pay | Admitting: Internal Medicine

## 2022-05-28 ENCOUNTER — Other Ambulatory Visit: Payer: Self-pay | Admitting: Internal Medicine

## 2022-05-28 DIAGNOSIS — E039 Hypothyroidism, unspecified: Secondary | ICD-10-CM

## 2022-05-28 MED ORDER — LEVOTHYROXINE SODIUM 50 MCG PO TABS: 50.0000 ug | ORAL_TABLET | Freq: Every day | ORAL | 0 refills | Status: DC

## 2022-05-28 NOTE — Telephone Encounter (Signed)
Prescription Request  05/28/2022  LOV: 12/14/2021  What is the name of the medication or equipment? levothyroxine (SYNTHROID) 50 MCG tablet   Have you contacted your pharmacy to request a refill? Yes   Which pharmacy would you like this sent to?  [facility] [id number] [place], [place] - [address] [facility] [address] [place] [address] Phone: [phone] Fax: [phone]  Patient notified that their request is being sent to the clinical staff for review and that they should receive a response within 2 business days.   Please advise at Mobile [phone] (mobile)

## 2022-05-29 ENCOUNTER — Other Ambulatory Visit: Payer: Self-pay | Admitting: Internal Medicine

## 2022-05-29 DIAGNOSIS — I4821 Permanent atrial fibrillation: Secondary | ICD-10-CM

## 2022-05-31 NOTE — Telephone Encounter (Signed)
Pt last saw Dr Graciela Husbands 09/21/21, last labs 10/20/21 Creat 1.16, age 89, weight 49.3kg, based on specified criteria pt is on appropriate dosage of Eliquis 2.5mg  BID for afib.  Will refill rx.

## 2022-06-07 DIAGNOSIS — L603 Nail dystrophy: Secondary | ICD-10-CM | POA: Diagnosis not present

## 2022-06-07 DIAGNOSIS — Z0389 Encounter for observation for other suspected diseases and conditions ruled out: Secondary | ICD-10-CM | POA: Diagnosis not present

## 2022-06-08 ENCOUNTER — Telehealth: Payer: Self-pay | Admitting: Internal Medicine

## 2022-06-08 NOTE — Telephone Encounter (Signed)
Patient would like to talk with the nurse regarding her asthma medication.  She said it is not working as well as it should.  Please advise and call patient to discuss at [phone]

## 2022-06-09 NOTE — Telephone Encounter (Signed)
Spoke with patient. She advises Asmanex doesn't last as long and causing her to wake up during the night. She has used the ventolin more when the Asmanex doesn't last. She gets more relief when using both, but wants to know if she use the Asmanex for more then one puff a day?   Dr. Celine Mans can you please advise?

## 2022-06-10 NOTE — Telephone Encounter (Signed)
Asmanex is 2 puffs once a day. She can try 1 puff BID. If this is not effective I'd recommend she come in for a follow up to see what we need to adjust.

## 2022-06-11 NOTE — Telephone Encounter (Signed)
Pt called the office about her asmanex inhaler and I went over with her the info per Dr. Celine Mans and she verbalized understanding. Nothing further needed.

## 2022-06-21 ENCOUNTER — Telehealth: Payer: Self-pay | Admitting: Internal Medicine

## 2022-06-21 NOTE — Telephone Encounter (Signed)
Spoke with patient. Patient states Asmanex is not really working well for her. Been using 1 BID- states she has to use ventolin halfway through the night to improve breathing some. Patient wants to know if mg can be increased?  Dr. Celine Mans please advise?

## 2022-06-21 NOTE — Telephone Encounter (Signed)
Pt state her ASMANEX, 60 METERED DOSES, 220 MCG/ACT inhaler [[id number]]  is not seeming to work for her. Has been using ventolin in between.

## 2022-06-21 NOTE — Telephone Encounter (Signed)
Prescription Request  06/21/2022  LOV: 12/14/2021  What is the name of the medication or equipment? diazepam (VALIUM) 2 MG tablet   Have you contacted your pharmacy to request a refill? Yes   Which pharmacy would you like this sent to?  [facility] [id number] [place], [place] - [address] [facility] [address] [place] [address] Phone: [phone] Fax: [phone]     Patient notified that their request is being sent to the clinical staff for review and that they should receive a response within 2 business days.   Please advise at Mobile [phone] (mobile)   Pt had one refill that expired 4.1.24

## 2022-06-21 NOTE — Telephone Encounter (Signed)
ATC X1 LVM for patient to call the office back. Please schedule apt with Dr. Celine Mans

## 2022-06-24 NOTE — Telephone Encounter (Signed)
Appt scheduled for pt with ND 5/17.

## 2022-06-25 ENCOUNTER — Ambulatory Visit: Payer: Medicare Other | Admitting: Internal Medicine

## 2022-06-25 ENCOUNTER — Encounter: Payer: Self-pay | Admitting: Internal Medicine

## 2022-06-25 ENCOUNTER — Telehealth: Payer: Self-pay | Admitting: Internal Medicine

## 2022-06-25 VITALS — BP 124/68 | HR 89 | Temp 97.9°F | Ht 66.0 in | Wt 103.0 lb

## 2022-06-25 DIAGNOSIS — J4540 Moderate persistent asthma, uncomplicated: Secondary | ICD-10-CM | POA: Diagnosis not present

## 2022-06-25 MED ORDER — DULERA 200-5 MCG/ACT IN AERO: 2.0000 | INHALATION_SPRAY | Freq: Two times a day (BID) | RESPIRATORY_TRACT | 11 refills | Status: DC

## 2022-06-25 NOTE — Telephone Encounter (Signed)
Spoke to pt and she went ahead and took her inhaler. Nothing further needed.

## 2022-06-25 NOTE — Patient Instructions (Signed)
Please schedule follow up scheduled with myself in 1 year.  If my schedule is not open yet, we will contact you with a reminder closer to that time. Please call [phone] if you haven't heard from [place] a month before.   Stop asmanex. I am switching you to a different inhaler called Dulera. Take 2 puffs in the morning, 2 puffs at night, gargle after use.  Continue the albuterol inhaler as needed.

## 2022-06-25 NOTE — Progress Notes (Signed)
Alyssa [name]    [id number]    [birth year]  Primary Care Physician:Jones, Bernadene Bell, MD Date of Appointment: 06/25/2022 Established Patient Visit  Chief complaint:   Chief Complaint  Patient presents with   Follow-up    Pt states she was doing well with the asmanex inhaler up until 1 month ago and stated that she started waking up in the middle of the night.     HPI: Alyssa [name] is a 89 y.o. woman with mild persistent asthma.   Interval Updates: Here for follow up for asthma. She is here because she feels the asmanex is not working as well as it once used to. She is now waking up at night almost every night for the last couple of weeks.  She is still taking albuterol inhaler at 4am. She does have seasonal allergies and feels this is flaring her breathing.   She gets more short of bre  No interval exacerbations for asthma.   Asthma Control Test ACT Total Score  06/25/2022  1:41 PM 12    I have reviewed the patient's family social and past medical history and updated as appropriate.   Past Medical History:  Diagnosis Date   Anxiety    Arthritis    Atrial fibrillation ([facility])    pacemaker, chronic anticoag   Breast cancer ([facility])    Diverticulosis    Fibroma    inner lips/mouth   GERD (gastroesophageal reflux disease)    Hx of breast cancer    Hyperkalemia    Hypertension    Hypothyroidism    IBS (irritable bowel syndrome)    Internal hemorrhoids    Left inguinal hernia    direct   MVP (mitral valve prolapse)    Osteoporosis    Renal insufficiency    Status post dilation of esophageal narrowing    Thyroid cancer [facility])     Past Surgical History:  Procedure Laterality Date   COLONOSCOPY  10/28/2005   internal hemorrhoids, diverticulosis (same as in 2002 and random bxs negative then)   EP IMPLANTABLE DEVICE N/A 02/26/2015   Procedure: PPM Generator Changeout;  Surgeon: Duke Salvia, MD;  Location: [facility] INVASIVE CV LAB;  Service: Cardiovascular;   Laterality: N/A;   MASTECTOMY  1982   Bilateral   PACEMAKER INSERTION     THYROIDECTOMY     TONSILLECTOMY     UPPER GASTROINTESTINAL ENDOSCOPY  09/06/2000   normal    Family History  Problem Relation Age of Onset   Stroke Mother    Colon cancer Father 79   Heart disease Father    Arthritis Other    Hypertension Other    Lung cancer Son    Esophageal cancer Neg Hx    Pancreatic cancer Neg Hx    Kidney disease Neg Hx    Liver disease Neg Hx    Diabetes Neg Hx    Rectal cancer Neg Hx    Stomach cancer Neg Hx     Social History   Occupational History   Occupation: Retired    Occupation: retired  Tobacco Use   Smoking status: Former    Types: Cigarettes    Quit date: 02/08/1954    Years since quitting: 68.4   Smokeless tobacco: Never  Vaping Use   Vaping Use: Never used  Substance and Sexual Activity   Alcohol use: No    Alcohol/week: 0.0 standard drinks of alcohol   Drug use: No   Sexual  activity: Not Currently     Physical Exam: Blood pressure 124/68, pulse 89, temperature 97.9 F (36.6 C), temperature source Oral, height 5\' 6"  (1.676 m), weight 103 lb (46.7 kg), SpO2 99 %.  Gen:      No acute distress, appears younger than stated age ENT:  no nasal polyps, mucus membranes moist Lungs:    No increased respiratory effort, symmetric chest wall excursion, clear to auscultation bilaterally, no wheezes or crackles CV:         Regular rate and rhythm; no murmurs, rubs, or gallops.  No pedal edema   Data Reviewed: Imaging: I have personally reviewed the chest xray Feb 2023 shows cardiomegaly, pace maker in place, hyperinflation.   PFTs:     Latest Ref Rng & Units 06/01/2021   12:54 PM  PFT Results  FVC-Pre L 1.80   FVC-Predicted Pre % 84   FVC-Post L 1.97   FVC-Predicted Post % 92   Pre FEV1/FVC % % 70   Post FEV1/FCV % % 61   FEV1-Pre L 1.27   FEV1-Predicted Pre % 80   FEV1-Post L 1.21    I have personally reviewed the patient's PFTs and mild airflow  limitation with borderline response to BD  Labs: Lab Results  Component Value Date   WBC 4.8 10/20/2021   HGB 13.3 10/20/2021   HCT 40.1 10/20/2021   MCV 95.6 10/20/2021   PLT 163.0 10/20/2021   Lab Results  Component Value Date   NA 142 10/20/2021   K 4.3 10/20/2021   CL 101 10/20/2021   CO2 30 10/20/2021    Immunization status: Immunization History  Administered Date(s) Administered   COVID-19, mRNA, vaccine(Comirnaty)12 years and older 11/25/2021   Fluad Quad(high Dose 65+) 10/19/2018, 11/13/2019, 11/25/2020   Influenza Split 11/27/2010, 12/23/2011   Influenza Whole 12/13/2008, 12/01/2009   Influenza, High Dose Seasonal PF 12/12/2012, 11/05/2015, 11/25/2016, 10/27/2017, 12/01/2021   Influenza,inj,Quad PF,6+ Mos 11/29/2013, 11/18/2014   Influenza-Unspecified 11/25/2021   PFIZER Comirnaty(Gray Top)Covid-19 Tri-Sucrose Vaccine 12/01/2021   PFIZER(Purple Top)SARS-COV-2 Vaccination 02/27/2019, 03/20/2019, 11/27/2019   Pneumococcal Conjugate-13 02/11/2014   Pneumococcal Polysaccharide-23 04/23/2010, 06/12/2019   Td 04/23/2010, 07/23/2021   Tdap 02/12/2018   Zoster, Live 08/24/2012    External Records Personally Reviewed: ED visit  Assessment:  Moderate persistent asthma, not well controlled Allergic seasonal rhinitis  Plan/Recommendations: Will increase therapy to advair 200 2 puffs twice a day. Gargle after use She wants to hold off on further therapy for allergies which is reasonable.   She will call me sooner if issues.   Return to Care: Return in about 1 year (around 06/25/2023).   Durel Salts, MD Pulmonary and Critical Care Medicine [facility] Office:[phone]

## 2022-06-25 NOTE — Telephone Encounter (Signed)
She has appt this AM. She wonders if she should take her Asthma medicine before coming in or not. Please call @ [phone] to advise. TY

## 2022-06-26 ENCOUNTER — Other Ambulatory Visit: Payer: Self-pay | Admitting: Internal Medicine

## 2022-06-26 DIAGNOSIS — F411 Generalized anxiety disorder: Secondary | ICD-10-CM

## 2022-06-26 MED ORDER — DIAZEPAM 2 MG PO TABS: 2.0000 mg | ORAL_TABLET | Freq: Three times a day (TID) | ORAL | 0 refills | Status: DC | PRN

## 2022-06-28 ENCOUNTER — Telehealth: Payer: Self-pay | Admitting: Internal Medicine

## 2022-06-28 NOTE — Telephone Encounter (Signed)
Inbound call from patient requesting f/u call from pa nurse in regards to her experiencing diarrhea Please advise.  Thank you

## 2022-06-28 NOTE — Telephone Encounter (Unsigned)
Pt stated that she has been good since September taking 1/2 pill of imodium every 3 days. Pt stated that stating on May 7th that she starting having more loose stools and pain in the right abdomen after she eats along with a poor appetite.  Pt stated that she has increased her imodium dosage and now taking 1 and half tablets today. Pt was notified that I will call her in the AM with an Office Visit date and time.  Pt verbalized understanding with all questions answered.

## 2022-06-29 NOTE — Telephone Encounter (Signed)
Pt was scheduled to see Alcide Evener NP on 07/06/2022 at 1:30 PM: Pt made aware. Pt verbalized understanding with all questions answered.

## 2022-07-02 ENCOUNTER — Telehealth: Payer: Self-pay | Admitting: Internal Medicine

## 2022-07-02 NOTE — Telephone Encounter (Signed)
PT calling again worried no call back and she will have to go the weekend w/o medication. Please call @ [phone]

## 2022-07-02 NOTE — Telephone Encounter (Signed)
PT is calling about a script change Dr. Celine Mans did at last recent appt. Here is her AVS notes:  Stop asmanex. I am switching you to a different inhaler called Dulera   PT said the Elwin Sleight is not working. She was awake all last night and really needs to get something else, she said. She has used it 2 days and it was not working great w/keeping her asthma in check. She stuck with it but it didn't work last night and made her lose her breath.   She only has two breaths left on her old inhaler. Please call PT @ [phone]. She needs something before the weekend please.   [facility] is: [name] on [address] and [employer]

## 2022-07-02 NOTE — Telephone Encounter (Signed)
Called and spoke with pt who stated that she was switched from Asmanex to [facility] due to her feeling like it was not lasting long enough in between doses to help keep her breathing under control. Pt said since she has been using the [facility] which she said cost her over $100, she said it has been keeping her awake at night and has not been working well to keep her asthma under control. Pt said that with using it, she feels like she is a little more SOB.  Pt is wanting something else prescribed in place of the [facility].  Routing to pharmacy team to see which inhalers are preferred with pt's insurance.

## 2022-07-03 ENCOUNTER — Other Ambulatory Visit: Payer: Self-pay | Admitting: Internal Medicine

## 2022-07-03 DIAGNOSIS — I5032 Chronic diastolic (congestive) heart failure: Secondary | ICD-10-CM

## 2022-07-06 ENCOUNTER — Encounter: Payer: Self-pay | Admitting: Nurse Practitioner

## 2022-07-06 ENCOUNTER — Other Ambulatory Visit: Payer: Medicare Other

## 2022-07-06 ENCOUNTER — Ambulatory Visit: Payer: Medicare Other | Admitting: Nurse Practitioner

## 2022-07-06 VITALS — BP 124/62 | HR 62 | Ht 66.0 in | Wt 100.0 lb

## 2022-07-06 DIAGNOSIS — K529 Noninfective gastroenteritis and colitis, unspecified: Secondary | ICD-10-CM

## 2022-07-06 DIAGNOSIS — R634 Abnormal weight loss: Secondary | ICD-10-CM

## 2022-07-06 NOTE — Progress Notes (Signed)
07/06/2022 REJEANNE MADEY [id number] Oct 25, 1924   Chief Complaint: Loose stools   History of Present Illness: Galaxy Vickery is a 89 year old female with a history of anxiety, hypertension, atrial fibrillation on Eliquis, MVP, asthma, thyroid cancer s/p thyroidectomy with post surgical hypothyroidism, breast cancer, IBS-D and GERD.  She is known by Dr. Leone Payor.  She presents today with complaints of persistent loose stool and weight loss.  She describes passing 1 nonbloody watery bowel movement most days for the past year.  She passes a solid formed stool once or twice monthly.  She takes Imodium one half tab as results and no bowel movement for 3 days and if she takes 1 tab of Imodium she goes 5 days without passing a bowel movement.  Phone note 06/28/2022 mentioned she experienced right sided abdominal pain.  She denies having any RUQ or RLQ pain at this time.  She avoids dairy products.  She consumes almond milk.  No recent antibiotics.  She underwent a colonoscopy 10/2021 without evidence of microscopic colitis or IBD.  She endorses losing 32 pounds since 10/2021.  CTAP 11/04/2021 findings did not explain her unintentional weight loss.  She eats toast, a banana and oat cereal for breakfast.  She eats a scrambled egg and toast for lunch and chicken or salmon with a vegetable for dinner.  No green vegetables.  She tolerates potatoes, rice and bread.  She is scheduled to see Dr. Yetta Barre within the next week for routine follow-up and lab.      Latest Ref Rng & Units 10/20/2021   11:18 AM 07/08/2021    9:25 AM 03/28/2021    5:33 PM  CBC  WBC 4.0 - 10.5 K/uL 4.8  3.1  5.3   Hemoglobin 12.0 - 15.0 g/dL 95.2  84.1  32.4   Hematocrit 36.0 - 46.0 % 40.1  38.8  38.8   Platelets 150.0 - 400.0 K/uL 163.0  159.0  189         Latest Ref Rng & Units 10/20/2021   11:18 AM 07/08/2021    9:25 AM 03/28/2021    5:33 PM  CMP  Glucose 70 - 99 mg/dL 70  87  401   BUN 6 - 23 mg/dL 29  29  33   Creatinine 0.40 - 1.20  mg/dL 0.27  2.53  6.64   Sodium 135 - 145 mEq/L 142  141  139   Potassium 3.5 - 5.1 mEq/L 4.3  4.6  4.2   Chloride 96 - 112 mEq/L 101  103  105   CO2 19 - 32 mEq/L 30  28  26    Calcium 8.4 - 10.5 mg/dL 9.5  9.7  8.8   Total Protein 6.0 - 8.3 g/dL 7.5     Total Bilirubin 0.2 - 1.2 mg/dL 0.6     Alkaline Phos 39 - 117 U/L 68     AST 0 - 37 U/L 23     ALT 0 - 35 U/L 18       TSH 5.85.   CTAP due to unintentional weight loss 10/2021:   Colonoscopy 10/22/2021: - Decreased tone and suggested a mass (nodular changes palpated) found on digital rectal exam. Mobile nodular changes in wall of proximal rectum. No mass seen  - no rectal cancer.  - Diverticulosis in the entire examined colon.  - The examination was otherwise normal on direct and retroflexion views.  - The examined portion of the ileum was normal.  - Biopsies were  taken with a cold forceps from the entire colon for evaluation of microscopic colitis. COLONIC MUCOSA WITHIN NORMAL LIMITS.  Past Medical History:  Diagnosis Date   Anxiety    Arthritis    Atrial fibrillation (HCC)    pacemaker, chronic anticoag   Breast cancer (HCC)    Diverticulosis    Fibroma    inner lips/mouth   GERD (gastroesophageal reflux disease)    Hx of breast cancer    Hyperkalemia    Hypertension    Hypothyroidism    IBS (irritable bowel syndrome)    Internal hemorrhoids    Left inguinal hernia    direct   MVP (mitral valve prolapse)    Osteoporosis    Renal insufficiency    Status post dilation of esophageal narrowing    Thyroid cancer [facility])    Past Surgical History:  Procedure Laterality Date   COLONOSCOPY  10/28/2005   internal hemorrhoids, diverticulosis (same as in 2002 and random bxs negative then)   EP IMPLANTABLE DEVICE N/A 02/26/2015   Procedure: PPM Generator Changeout;  Surgeon: Duke Salvia, MD;  Location: [facility] INVASIVE CV LAB;  Service: Cardiovascular;  Laterality: N/A;   MASTECTOMY  1982   Bilateral   PACEMAKER INSERTION      THYROIDECTOMY     TONSILLECTOMY     UPPER GASTROINTESTINAL ENDOSCOPY  09/06/2000   normal   Current Outpatient Medications on File Prior to Visit  Medication Sig Dispense Refill   acetaminophen (TYLENOL) 650 MG CR tablet Take 1,300 mg by mouth 2 (two) times daily.     albuterol (VENTOLIN HFA) 108 (90 Base) MCG/ACT inhaler Inhale 2 puffs into the lungs every 6 (six) hours as needed. 18 g 5   ASMANEX, 60 METERED DOSES, 220 MCG/ACT inhaler Inhale 1 puff into the lungs 2 (two) times daily.     bifidobacterium infantis (ALIGN) capsule Take 2 capsules by mouth daily.     diazepam (VALIUM) 2 MG tablet Take 1 tablet (2 mg total) by mouth every 8 (eight) hours as needed for anxiety. 90 tablet 0   ELIQUIS 2.5 MG TABS tablet TAKE 1 TABLET BY MOUTH TWICE A DAY 180 tablet 1   furosemide (LASIX) 40 MG tablet TAKE 1 TABLET BY MOUTH DAILY; MAY TAKE ADDITIONAL HALF TABLET AS NEEDED FOR WEIGHT GAIN 135 tablet 0   Homeopathic Products ([employer] EAR PAIN RELIEF OT) Place 3 drops into the left ear as needed (Pain).     levothyroxine (SYNTHROID) 50 MCG tablet Take 1 tablet (50 mcg total) by mouth daily before breakfast. 90 tablet 0   Loperamide HCl (IMODIUM PO) Take by mouth. 1/2 tab as needed for diarrhea     MISC NATURAL PRODUCTS EX Apply 1 application topically in the morning and at bedtime. CBD cream for back and ankle pain     mupirocin ointment (BACTROBAN) 2 %      potassium chloride (KLOR-CON M) 10 MEQ tablet TAKE 1 TABLET BY MOUTH DAILY 90 tablet 0   Propylene Glycol (SYSTANE COMPLETE OP) Apply 1 drop to eye as needed (dryness).     sodium chloride (OCEAN) 0.65 % SOLN nasal spray Place 1 spray into both nostrils as needed for congestion.     mometasone-formoterol (DULERA) 200-5 MCG/ACT AERO Inhale 2 puffs into the lungs in the morning and at bedtime. (Patient not taking: Reported on 07/06/2022) 1 each 11   No current facility-administered medications on file prior to visit.   Allergies  Allergen Reactions    Penicillins Diarrhea  Has IBS (caused diarrhea)   Current Medications, Allergies, Past Medical History, Past Surgical History, Family History and Social History were reviewed in [employer] record.  Review of Systems:   Constitutional: See HPI.  No fever, sweats or chills.   Respiratory: Negative for shortness of breath.   Cardiovascular: Negative for chest pain, palpitations and leg swelling.  Gastrointestinal: See HPI.  Musculoskeletal: Negative for back pain or muscle aches.  Neurological: Negative for dizziness, headaches or paresthesias.   Physical Exam: BP 124/62   Pulse 62   Ht 5\' 6"  (1.676 m)   Wt 100 lb (45.4 kg)   BMI 16.14 kg/m  General: Sarcopenic appearing 89 year old female in no acute distress. Head: Normocephalic and atraumatic. Eyes: No scleral icterus. Conjunctiva pink . Ears: Normal auditory acuity. Mouth: Dentition intact. No ulcers or lesions.  Lungs: Clear throughout to auscultation. Heart: Regular rate and rhythm, no murmur. Abdomen: Soft, protuberant, nontender and nondistended. No masses or hepatomegaly. Normal bowel sounds x 4 quadrants.  Rectal: Deferred.  Musculoskeletal: Symmetrical with no gross deformities. Extremities: No edema. Neurological: Alert oriented x 4. No focal deficits.  Psychological: Alert and cooperative. Normal mood and affect  Assessment and Recommendations:  89 year old female with chronic watery diarrhea.  Imodium 1/2 tab results and no BM for 3 days, 1 tab results in no BM x 5 days.  No abdominal pain at this time.  Colonoscopy 10/2021 without evidence of microscopic colitis or IBD. -Check pancreatic elastase level -Stop Imodium -Benefiber 1 tablespoon daily as tolerated -Patient to contact our office if no improvement or if symptoms worsen  Unintentional weight loss.  CTAP 10/2021 findings did not explain etiology for weight loss.  Colonoscopy 10/2021 without evidence of colorectal cancer. -Eggs,  avocado and peanut butter as tolerated.  Lactose-free boost or Ensure 1 to 2 cans daily. -Follow-up with Dr. Yetta Barre as planned within the next week for follow-up and labs  History of breast and thyroid cancer

## 2022-07-06 NOTE — Patient Instructions (Addendum)
Your provider has requested that you go to the basement level for lab work before leaving today. Press "B" on the elevator. The lab is located at the first door on the left as you exit the elevator.  Use Benefiber one tablespoon once daily to regulate bowel movements.   Please start drinking lactose free boost or ensure 1-2 cans daily.   We recommend a diet consisting of avocados, eggs, peanut butter which is better tolerated  Follow up as needed. _______________________________________________________  If your blood pressure at your visit was 140/90 or greater, please contact your primary care physician to follow up on this.  _______________________________________________________  If you are age 87 or older, your body mass index should be between 23-30. Your Body mass index is 16.14 kg/m. If this is out of the aforementioned range listed, please consider follow up with your Primary Care Provider.  If you are age 64 or younger, your body mass index should be between 19-25. Your Body mass index is 16.14 kg/m. If this is out of the aformentioned range listed, please consider follow up with your Primary Care Provider.   ________________________________________________________  The [facility] providers would like to encourage you to use [facility] to communicate with providers for non-urgent requests or questions.  Due to long hold times on the telephone, sending your provider a message by [facility] may be a faster and more efficient way to get a response.  Please allow 48 business hours for a response.  Please remember that this is for non-urgent requests.  _______________________________________________________ Thank you for trusting me with your gastrointestinal care!   Alcide Evener, CRNP

## 2022-07-08 ENCOUNTER — Encounter: Payer: Self-pay | Admitting: Internal Medicine

## 2022-07-08 ENCOUNTER — Ambulatory Visit (INDEPENDENT_AMBULATORY_CARE_PROVIDER_SITE_OTHER): Payer: Medicare Other | Admitting: Internal Medicine

## 2022-07-08 VITALS — BP 124/80 | HR 86 | Temp 97.9°F | Resp 16 | Ht 66.0 in | Wt 102.0 lb

## 2022-07-08 DIAGNOSIS — N184 Chronic kidney disease, stage 4 (severe): Secondary | ICD-10-CM

## 2022-07-08 DIAGNOSIS — E778 Other disorders of glycoprotein metabolism: Secondary | ICD-10-CM

## 2022-07-08 DIAGNOSIS — F411 Generalized anxiety disorder: Secondary | ICD-10-CM

## 2022-07-08 DIAGNOSIS — N1832 Chronic kidney disease, stage 3b: Secondary | ICD-10-CM

## 2022-07-08 DIAGNOSIS — I10 Essential (primary) hypertension: Secondary | ICD-10-CM | POA: Diagnosis not present

## 2022-07-08 DIAGNOSIS — E039 Hypothyroidism, unspecified: Secondary | ICD-10-CM | POA: Diagnosis not present

## 2022-07-08 DIAGNOSIS — N2889 Other specified disorders of kidney and ureter: Secondary | ICD-10-CM

## 2022-07-08 LAB — CBC WITH DIFFERENTIAL/PLATELET
Basophils Absolute: 0 10*3/uL (ref 0.0–0.1)
Basophils Relative: 0.7 % (ref 0.0–3.0)
Eosinophils Absolute: 0 10*3/uL (ref 0.0–0.7)
Eosinophils Relative: 0.6 % (ref 0.0–5.0)
HCT: 36.1 % (ref 36.0–46.0)
Hemoglobin: 11.9 g/dL — ABNORMAL LOW (ref 12.0–15.0)
Lymphocytes Relative: 31.5 % (ref 12.0–46.0)
Lymphs Abs: 1.2 10*3/uL (ref 0.7–4.0)
MCHC: 32.8 g/dL (ref 30.0–36.0)
MCV: 96.9 fl (ref 78.0–100.0)
Monocytes Absolute: 0.3 10*3/uL (ref 0.1–1.0)
Monocytes Relative: 7.2 % (ref 3.0–12.0)
Neutro Abs: 2.2 10*3/uL (ref 1.4–7.7)
Neutrophils Relative %: 60 % (ref 43.0–77.0)
Platelets: 154 10*3/uL (ref 150.0–400.0)
RBC: 3.73 Mil/uL — ABNORMAL LOW (ref 3.87–5.11)
RDW: 12.5 % (ref 11.5–15.5)
WBC: 3.7 10*3/uL — ABNORMAL LOW (ref 4.0–10.5)

## 2022-07-08 LAB — HEPATIC FUNCTION PANEL
ALT: 21 U/L (ref 0–35)
AST: 26 U/L (ref 0–37)
Albumin: 4.4 g/dL (ref 3.5–5.2)
Alkaline Phosphatase: 61 U/L (ref 39–117)
Bilirubin, Direct: 0.1 mg/dL (ref 0.0–0.3)
Total Bilirubin: 0.4 mg/dL (ref 0.2–1.2)
Total Protein: 6.6 g/dL (ref 6.0–8.3)

## 2022-07-08 LAB — BASIC METABOLIC PANEL WITH GFR
BUN: 29 mg/dL — ABNORMAL HIGH (ref 6–23)
CO2: 32 meq/L (ref 19–32)
Calcium: 9.1 mg/dL (ref 8.4–10.5)
Chloride: 100 meq/L (ref 96–112)
Creatinine, Ser: 1.16 mg/dL (ref 0.40–1.20)
GFR: 39.88 mL/min — ABNORMAL LOW
Glucose, Bld: 95 mg/dL (ref 70–99)
Potassium: 4.8 meq/L (ref 3.5–5.1)
Sodium: 140 meq/L (ref 135–145)

## 2022-07-08 LAB — TSH: TSH: 5.24 u[IU]/mL (ref 0.35–5.50)

## 2022-07-08 MED ORDER — LEVOTHYROXINE SODIUM 50 MCG PO TABS
50.0000 ug | ORAL_TABLET | Freq: Every day | ORAL | 1 refills | Status: AC
Start: 2022-07-08 — End: ?

## 2022-07-08 NOTE — Patient Instructions (Signed)

## 2022-07-08 NOTE — Progress Notes (Signed)
Subjective:  Patient ID: Alyssa [name], female    DOB: [birth year]  Age: 89 y.o. MRN: [id number]  CC: Hypothyroidism and Anemia   HPI RAYNE NOGALES presents for f/up ---    Outpatient Medications Prior to Visit  Medication Sig Dispense Refill   acetaminophen (TYLENOL) 650 MG CR tablet Take 1,300 mg by mouth 2 (two) times daily.     albuterol (VENTOLIN HFA) 108 (90 Base) MCG/ACT inhaler Inhale 2 puffs into the lungs every 6 (six) hours as needed. 18 g 5   ASMANEX, 60 METERED DOSES, 220 MCG/ACT inhaler Inhale 1 puff into the lungs 2 (two) times daily.     bifidobacterium infantis (ALIGN) capsule Take 2 capsules by mouth daily.     ELIQUIS 2.5 MG TABS tablet TAKE 1 TABLET BY MOUTH TWICE A DAY 180 tablet 1   furosemide (LASIX) 40 MG tablet TAKE 1 TABLET BY MOUTH DAILY; MAY TAKE ADDITIONAL HALF TABLET AS NEEDED FOR WEIGHT GAIN 135 tablet 0   Homeopathic Products ([employer] EAR PAIN RELIEF OT) Place 3 drops into the left ear as needed (Pain).     Loperamide HCl (IMODIUM PO) Take by mouth. 1/2 tab as needed for diarrhea     MISC NATURAL PRODUCTS EX Apply 1 application topically in the morning and at bedtime. CBD cream for back and ankle pain     mometasone-formoterol (DULERA) 200-5 MCG/ACT AERO Inhale 2 puffs into the lungs in the morning and at bedtime. 1 each 11   mupirocin ointment (BACTROBAN) 2 %      potassium chloride (KLOR-CON M) 10 MEQ tablet TAKE 1 TABLET BY MOUTH DAILY 90 tablet 0   Propylene Glycol (SYSTANE COMPLETE OP) Apply 1 drop to eye as needed (dryness).     sodium chloride (OCEAN) 0.65 % SOLN nasal spray Place 1 spray into both nostrils as needed for congestion.     diazepam (VALIUM) 2 MG tablet Take 1 tablet (2 mg total) by mouth every 8 (eight) hours as needed for anxiety. 90 tablet 0   levothyroxine (SYNTHROID) 50 MCG tablet Take 1 tablet (50 mcg total) by mouth daily before breakfast. 90 tablet 0   No facility-administered medications prior to visit.    ROS Review of Systems   Constitutional:  Positive for unexpected weight change (wt loss). Negative for appetite change, chills, diaphoresis and fatigue.  HENT: Negative.    Eyes: Negative.   Respiratory:  Positive for cough, shortness of breath and wheezing. Negative for choking and chest tightness.   Cardiovascular:  Negative for chest pain, palpitations and leg swelling.  Gastrointestinal:  Positive for diarrhea. Negative for abdominal pain, blood in stool, constipation, nausea and vomiting.  Genitourinary: Negative.   Musculoskeletal:  Negative for arthralgias.  Skin: Negative.   Neurological: Negative.  Negative for dizziness and weakness.  Hematological:  Negative for adenopathy. Does not bruise/bleed easily.  Psychiatric/Behavioral:  Positive for confusion. Negative for behavioral problems, decreased concentration, sleep disturbance and suicidal ideas. The patient is nervous/anxious.     Objective:  BP 124/80 (BP Location: Left Arm, Patient Position: Sitting, Cuff Size: Large)   Pulse 86   Temp 97.9 F (36.6 C) (Oral)   Resp 16   Ht 5\' 6"  (1.676 m)   Wt 102 lb (46.3 kg)   SpO2 90%   BMI 16.46 kg/m   BP Readings from Last 3 Encounters:  07/08/22 124/80  07/06/22 124/62  06/25/22 124/68    Wt Readings from Last 3 Encounters:  07/08/22 102 lb (  46.3 kg)  07/06/22 100 lb (45.4 kg)  06/25/22 103 lb (46.7 kg)    Physical Exam Vitals reviewed.  Constitutional:      General: She is not in acute distress.    Appearance: She is ill-appearing. She is not toxic-appearing or diaphoretic.  HENT:     Mouth/Throat:     Mouth: Mucous membranes are moist.  Eyes:     General: No scleral icterus.    Conjunctiva/sclera: Conjunctivae normal.  Cardiovascular:     Rate and Rhythm: Normal rate and regular rhythm.     Heart sounds: Murmur heard.     No gallop.     Comments: 1/6 murmur LLSB Pulmonary:     Effort: Pulmonary effort is normal.     Breath sounds: No stridor. No wheezing, rhonchi or rales.   Abdominal:     Palpations: There is no mass.     Tenderness: There is no abdominal tenderness. There is no guarding.     Hernia: No hernia is present.  Musculoskeletal:        General: Normal range of motion.     Cervical back: Neck supple.     Right lower leg: No edema.     Left lower leg: No edema.  Lymphadenopathy:     Cervical: No cervical adenopathy.  Skin:    General: Skin is warm and dry.  Neurological:     General: No focal deficit present.     Mental Status: She is alert. Mental status is at baseline.  Psychiatric:        Attention and Perception: She is inattentive.        Mood and Affect: Mood is anxious.        Speech: Speech normal.        Behavior: Behavior normal.        Thought Content: Thought content normal.        Cognition and Memory: Cognition normal.     Lab Results  Component Value Date   WBC 3.7 (L) 07/08/2022   HGB 11.9 (L) 07/08/2022   HCT 36.1 07/08/2022   PLT 154.0 07/08/2022   GLUCOSE 95 07/08/2022   CHOL 155 02/11/2014   TRIG 84.0 02/11/2014   HDL 46.10 02/11/2014   LDLCALC 92 02/11/2014   ALT 21 07/08/2022   AST 26 07/08/2022   NA 140 07/08/2022   K 4.8 07/08/2022   CL 100 07/08/2022   CREATININE 1.16 07/08/2022   BUN 29 (H) 07/08/2022   CO2 32 07/08/2022   TSH 5.24 07/08/2022   INR 1.3 (A) 03/17/2020   HGBA1C 5.7 06/12/2019    CT Abdomen Pelvis W Contrast  Result Date: 11/06/2021 CLINICAL DATA:  Unintentional weight loss. EXAM: CT ABDOMEN AND PELVIS WITH CONTRAST TECHNIQUE: Multidetector CT imaging of the abdomen and pelvis was performed using the standard protocol following bolus administration of intravenous contrast. RADIATION DOSE REDUCTION: This exam was performed according to the departmental dose-optimization program which includes automated exposure control, adjustment of the mA and/or kV according to patient size and/or use of iterative reconstruction technique. CONTRAST:  80mL OMNIPAQUE IOHEXOL 300 MG/ML  SOLN COMPARISON:   08/29/2014. FINDINGS: Lower chest: Bibasilar scarring. Heart is enlarged. Small pericardial effusion. Trace bilateral pleural effusions. Distal esophagus is grossly unremarkable. Prominent cisterna chyli. Hepatobiliary: 12 mm cyst in the left hepatic lobe. Liver and gallbladder are otherwise unremarkable. No biliary ductal dilatation. Pancreas: A cleft in body of the pancreas (2/31) simulates a low-attenuation lesion. Otherwise negative. Spleen: Negative. Adrenals/Urinary Tract:  Adrenal glands are unremarkable. Subcentimeter low-attenuation lesions in the kidneys are too small to characterize. No specific follow-up necessary. Bladder is grossly unremarkable. Stomach/Bowel: Stomach and small bowel are unremarkable. Appendix is not readily visualized. A fair amount of stool in the colon is indicative of constipation. Vascular/Lymphatic: Atherosclerotic calcification of the aorta. No pathologically enlarged lymph nodes. Reproductive: Uterus is visualized.  No adnexal mass. Other: Small left inguinal hernia contains fluid. Trace pelvic free fluid. Mesenteries and peritoneum grossly unremarkable. Musculoskeletal: Degenerative changes in the spine and hips. Mild dextroconvex curvature, centered at L3. Old T12 compression fracture. No worrisome lytic or sclerotic lesions. IMPRESSION: 1. No findings to explain the patient's weight loss. 2. Small pericardial effusion. 3. Trace bilateral pleural effusions. 4. Fair amount of stool in the colon is indicative of constipation. 5. Small inguinal hernia contains fluid. 6.  Aortic atherosclerosis (ICD10-I70.0). Electronically Signed   By: Leanna Battles M.D.   On: 11/06/2021 08:10    Assessment & Plan:   Chronic renal impairment, stage 3b (HCC) -     Basic metabolic panel; Future  Essential hypertension, benign-her blood pressure is well-controlled. -     CBC with Differential/Platelet; Future -     TSH; Future -     Basic metabolic panel; Future  Acquired  hypothyroidism-she is euthyroid. -     TSH; Future -     Levothyroxine Sodium; Take 1 tablet (50 mcg total) by mouth daily before breakfast.  Dispense: 90 tablet; Refill: 1  Hypoproteinemia (HCC) -     Hepatic function panel; Future  Chronic kidney disease, stage 4, severely decreased GFR (HCC)- Will avoid nephrotoxic agents.  GAD (generalized anxiety disorder) -     diazePAM; Take 1 tablet (2 mg total) by mouth every 8 (eight) hours as needed for anxiety.  Dispense: 90 tablet; Refill: 5     Follow-up: Return in about 6 months (around 01/08/2023).  Sanda Linger, MD

## 2022-07-09 ENCOUNTER — Other Ambulatory Visit (HOSPITAL_COMMUNITY): Payer: Self-pay

## 2022-07-09 ENCOUNTER — Ambulatory Visit: Payer: Medicare Other

## 2022-07-09 ENCOUNTER — Encounter: Payer: Self-pay | Admitting: Internal Medicine

## 2022-07-09 DIAGNOSIS — R634 Abnormal weight loss: Secondary | ICD-10-CM | POA: Diagnosis not present

## 2022-07-09 DIAGNOSIS — K529 Noninfective gastroenteritis and colitis, unspecified: Secondary | ICD-10-CM

## 2022-07-09 MED ORDER — DIAZEPAM 2 MG PO TABS
2.0000 mg | ORAL_TABLET | Freq: Three times a day (TID) | ORAL | 5 refills | Status: AC | PRN
Start: 2022-07-09 — End: ?

## 2022-07-09 MED ORDER — FLUTICASONE-SALMETEROL 250-50 MCG/ACT IN AEPB: 1.0000 | INHALATION_SPRAY | Freq: Two times a day (BID) | RESPIRATORY_TRACT | 11 refills | Status: DC

## 2022-07-09 NOTE — Telephone Encounter (Signed)
Routing to Dr.Desai

## 2022-07-09 NOTE — Telephone Encounter (Signed)
Patient in the initial benefit and the coverage gap which is contributing to co-pays. Per test claims at this time alternatives of Dulera covered are:   Wixela/Generic Advair Diskus-$65.40 Symbicort-$71.88 Breo-$105.43

## 2022-07-09 NOTE — Telephone Encounter (Signed)
Pt called in bc she will like to keep her Dulera.

## 2022-07-09 NOTE — Telephone Encounter (Signed)
Called and spoke to patient. She stated that she would like to continue on Dulera. She now feels that Elwin Sleight is effective for her. Lm for [facility] pharmacy to cancel wixela Rx. Routing to Dr. Celine Mans as an Lorain Childes

## 2022-07-12 ENCOUNTER — Ambulatory Visit (INDEPENDENT_AMBULATORY_CARE_PROVIDER_SITE_OTHER): Payer: Medicare Other

## 2022-07-12 DIAGNOSIS — I442 Atrioventricular block, complete: Secondary | ICD-10-CM | POA: Diagnosis not present

## 2022-07-13 ENCOUNTER — Telehealth: Payer: Self-pay | Admitting: Nurse Practitioner

## 2022-07-13 LAB — CUP PACEART REMOTE DEVICE CHECK
Battery Impedance: 1973 Ohm
Battery Remaining Longevity: 32 mo
Battery Voltage: 2.76 V
Brady Statistic RV Percent Paced: 100 %
Date Time Interrogation Session: 20240603083608
Implantable Lead Connection Status: 753985
Implantable Lead Connection Status: 753985
Implantable Lead Implant Date: 20000921
Implantable Lead Implant Date: 20000921
Implantable Lead Location: 753859
Implantable Lead Location: 753860
Implantable Pulse Generator Implant Date: 20170118
Lead Channel Impedance Value: 555 Ohm
Lead Channel Impedance Value: 67 Ohm
Lead Channel Pacing Threshold Amplitude: 0.875 V
Lead Channel Pacing Threshold Pulse Width: 0.4 ms
Lead Channel Setting Pacing Amplitude: 2.5 V
Lead Channel Setting Pacing Pulse Width: 0.4 ms
Lead Channel Setting Sensing Sensitivity: 2 mV
Zone Setting Status: 755011
Zone Setting Status: 755011

## 2022-07-13 NOTE — Telephone Encounter (Signed)
Left message for patient to call back  

## 2022-07-13 NOTE — Telephone Encounter (Signed)
Patient called said she was advised to take Imodium for diarrhea, she said this morning she was able to have a regular BM however right after had diarrhea again. Seeking further advise.

## 2022-07-13 NOTE — Telephone Encounter (Signed)
Patient states that she was advised NOT to take imodium any longer so she has completely stopped this. She was also recently recommended to take benefiber 1 tablespoon daily and ha been doing this with good results. She tells me that she has been having normal morning bowel movements for the last several days. However, she does continue to have diarrhea throughout the day. When asked if she was using lactose free ensure/boost as recommended, she stated that she is taking the ensure but not lactose free. I suggested that she purchase the lactose free ensure to see if this helps with her diarrhea later in the day. She is advised to call us back should the changes to her diet/medication regimen does not continue to improve her symptoms.

## 2022-07-13 NOTE — Telephone Encounter (Signed)
Inbound call from patient returning phone call. Please advise, thank you.  

## 2022-07-16 LAB — PANCREATIC ELASTASE, FECAL: Pancreatic Elastase-1, Stool: >500 mcg/g

## 2022-07-23 NOTE — Telephone Encounter (Signed)
Inbound call from patient , requesting a call back in regards a medication she is taking.Please advise

## 2022-07-23 NOTE — Telephone Encounter (Signed)
I have spoken to patient who tells me that she has been extremely pleased with how her bowels have been doing since taking benefiber; she is having solid stools whereas she has not had this in years. She does, however, question whether [employer] Side wanted her to avoid wheat products. She had wheat bread yesterday and says it caused her stools to be much looser. I advised that [employer] did not specifically say for her to avoid wheat, but if we know this is a trigger for her diarrhea, than she should avoid it. In addition, patient states she had chocolate candies yesterday. Reminded patient that chocolate does have some lactose and artificial sweeteners in it which it seems she may be very sensitive to. Advised this may have added to her diarrhea as well and cautioned her about this. Patient verbalizes understanding and will call back with any additional questions.

## 2022-08-02 ENCOUNTER — Telehealth: Payer: Self-pay | Admitting: Internal Medicine

## 2022-08-02 DIAGNOSIS — R0602 Shortness of breath: Secondary | ICD-10-CM

## 2022-08-02 NOTE — Telephone Encounter (Signed)
I let her know the answer provided by Triage. She needs her Albuterol refilled.   Karin [name] Coker and [employer]

## 2022-08-02 NOTE — Telephone Encounter (Signed)
Pt wants to know if the albuterol (VENTOLIN HFA) 108 (90 Base) MCG/ACT inhaler  will work with her taking Dulera ? Please Advise.

## 2022-08-03 MED ORDER — ALBUTEROL SULFATE HFA 108 (90 BASE) MCG/ACT IN AERS
2.0000 | INHALATION_SPRAY | Freq: Four times a day (QID) | RESPIRATORY_TRACT | 5 refills | Status: AC | PRN
Start: 2022-08-03 — End: ?

## 2022-08-03 NOTE — Progress Notes (Signed)
Remote pacemaker transmission.   

## 2022-08-03 NOTE — Telephone Encounter (Signed)
Refill for albuterol (VENTOLIN HFA) 108 (90 Base) MCG/ACT inhaler sent pt's pharmacy of choice. Nothing further needed.

## 2022-08-16 ENCOUNTER — Telehealth: Payer: Self-pay | Admitting: Nurse Practitioner

## 2022-08-16 NOTE — Telephone Encounter (Signed)
Inbound call from patient requesting to speak with a nurse as soon as possible. Patient stated food is getting stuck in her throat also water sometimes and she is really worry about it.Please advise.

## 2022-08-16 NOTE — Telephone Encounter (Signed)
Pt stated that her swallowing issues have being ongoing for a while but seems to be getting worse now. Pt stated that she feels that her food is starting to get stuck more often now and it seems that when she drinks liquids that she is coughing more afterwards. Pt encouraged to take really small bites, stay away from bread for now and tough meats such as steak and pork chops. Pt was scheduled for an office visit on 08/19/2022 at 11:30 PM with [facility].  Pt made aware. Pt verbalized understanding with all questions answered.

## 2022-08-18 NOTE — Progress Notes (Unsigned)
Chief Complaint: Primary GI MD:Dr. Leone Payor  HPI: 89 year old female with a history of anxiety, hypertension, atrial fibrillation on Eliquis, MVP, asthma, thyroid cancer s/p thyroidectomy with post surgical hypothyroidism, breast cancer, IBS-D and GERD presents for follow-up.  Last seen 07/06/2022 by Alcide Evener NP.  At that time she reported loose stools and weight loss.  Had colonoscopy and CT abdomen pelvis that did not explain her weight loss.  Pancreatic elastase greater than 500.  Recommended to do fiber daily.      PREVIOUS GI WORKUP   CTAP due to unintentional weight loss 10/2021: No findings to explain weight loss.  Fair amount of stool in colon indicative of constipation.   Colonoscopy 10/22/2021: - Decreased tone and suggested a mass (nodular changes palpated) found on digital rectal exam. Mobile nodular changes in wall of proximal rectum. No mass seen  - no rectal cancer.  - Diverticulosis in the entire examined colon.  - The examination was otherwise normal on direct and retroflexion views.  - The examined portion of the ileum was normal.  - Biopsies were taken with a cold forceps from the entire colon for evaluation of microscopic colitis. COLONIC MUCOSA WITHIN NORMAL LIMITS.  Past Medical History:  Diagnosis Date   Anxiety    Arthritis    Atrial fibrillation (HCC)    pacemaker, chronic anticoag   Breast cancer (HCC)    Diverticulosis    Fibroma    inner lips/mouth   GERD (gastroesophageal reflux disease)    Hx of breast cancer    Hyperkalemia    Hypertension    Hypothyroidism    IBS (irritable bowel syndrome)    Internal hemorrhoids    Left inguinal hernia    direct   MVP (mitral valve prolapse)    Osteoporosis    Renal insufficiency    Status post dilation of esophageal narrowing    Thyroid cancer [facility])     Past Surgical History:  Procedure Laterality Date   COLONOSCOPY  10/28/2005   internal hemorrhoids, diverticulosis (same as in 2002  and random bxs negative then)   EP IMPLANTABLE DEVICE N/A 02/26/2015   Procedure: PPM Generator Changeout;  Surgeon: Duke Salvia, MD;  Location: [facility] INVASIVE CV LAB;  Service: Cardiovascular;  Laterality: N/A;   MASTECTOMY  1982   Bilateral   PACEMAKER INSERTION     THYROIDECTOMY     TONSILLECTOMY     UPPER GASTROINTESTINAL ENDOSCOPY  09/06/2000   normal    Current Outpatient Medications  Medication Sig Dispense Refill   acetaminophen (TYLENOL) 650 MG CR tablet Take 1,300 mg by mouth 2 (two) times daily.     albuterol (VENTOLIN HFA) 108 (90 Base) MCG/ACT inhaler Inhale 2 puffs into the lungs every 6 (six) hours as needed. 18 g 5   bifidobacterium infantis (ALIGN) capsule Take 2 capsules by mouth daily.     diazepam (VALIUM) 2 MG tablet Take 1 tablet (2 mg total) by mouth every 8 (eight) hours as needed for anxiety. 90 tablet 5   ELIQUIS 2.5 MG TABS tablet TAKE 1 TABLET BY MOUTH TWICE A DAY 180 tablet 1   furosemide (LASIX) 40 MG tablet TAKE 1 TABLET BY MOUTH DAILY; MAY TAKE ADDITIONAL HALF TABLET AS NEEDED FOR WEIGHT GAIN 135 tablet 0   Homeopathic Products ([employer] EAR PAIN RELIEF OT) Place 3 drops into the left ear as needed (Pain).     levothyroxine (SYNTHROID) 50 MCG tablet Take 1 tablet (50 mcg total) by mouth daily before breakfast.  90 tablet 1   Loperamide HCl (IMODIUM PO) Take by mouth. 1/2 tab as needed for diarrhea     MISC NATURAL PRODUCTS EX Apply 1 application topically in the morning and at bedtime. CBD cream for back and ankle pain     mupirocin ointment (BACTROBAN) 2 %      potassium chloride (KLOR-CON M) 10 MEQ tablet TAKE 1 TABLET BY MOUTH DAILY 90 tablet 0   Propylene Glycol (SYSTANE COMPLETE OP) Apply 1 drop to eye as needed (dryness).     sodium chloride (OCEAN) 0.65 % SOLN nasal spray Place 1 spray into both nostrils as needed for congestion.     No current facility-administered medications for this visit.    Allergies as of 08/19/2022 - Review Complete 07/09/2022   Allergen Reaction Noted   Penicillins Diarrhea 03/30/2021    Family History  Problem Relation Age of Onset   Stroke Mother    Colon cancer Father 44   Heart disease Father    Arthritis Other    Hypertension Other    Lung cancer Son    Esophageal cancer Neg Hx    Pancreatic cancer Neg Hx    Kidney disease Neg Hx    Liver disease Neg Hx    Diabetes Neg Hx    Rectal cancer Neg Hx    Stomach cancer Neg Hx     Social History   Socioeconomic History   Marital status: Widowed    Spouse name: Not on file   Number of children: 3   Years of education: Not on file   Highest education level: Not on file  Occupational History   Occupation: Retired    Occupation: retired  Tobacco Use   Smoking status: Former    Types: Cigarettes    Quit date: 02/08/1954    Years since quitting: 68.5   Smokeless tobacco: Never  Vaping Use   Vaping Use: Never used  Substance and Sexual Activity   Alcohol use: No    Alcohol/week: 0.0 standard drinks of alcohol   Drug use: No   Sexual activity: Not Currently  Other Topics Concern   Not on file  Social History Narrative   Regular Exercise: Yes   Daily Caffeine Use: Sometimes.            Social Determinants of Health   Financial Resource Strain: Low Risk  (12/18/2021)   Overall Financial Resource Strain (CARDIA)    Difficulty of Paying Living Expenses: Not hard at all  Food Insecurity: No Food Insecurity (12/18/2021)   Hunger Vital Sign    Worried About Running Out of Food in the Last Year: Never true    Ran Out of Food in the Last Year: Never true  Transportation Needs: No Transportation Needs (12/18/2021)   PRAPARE - Administrator, Civil Service (Medical): No    Lack of Transportation (Non-Medical): No  Physical Activity: Sufficiently Active (12/18/2021)   Exercise Vital Sign    Days of Exercise per Week: 5 days    Minutes of Exercise per Session: 30 min  Stress: No Stress Concern Present (12/18/2021)   [employer] of Occupational Health - Occupational Stress Questionnaire    Feeling of Stress : Not at all  Social Connections: Moderately Integrated (12/18/2021)   Social Connection and Isolation Panel [NHANES]    Frequency of Communication with Friends and Family: More than three times a week    Frequency of Social Gatherings with Friends and Family: More than three times  a week    Attends Religious Services: More than 4 times per year    Active Member of Clubs or Organizations: Yes    Attends Banker Meetings: More than 4 times per year    Marital Status: Widowed  Intimate Partner Violence: Not At Risk (12/18/2021)   Humiliation, Afraid, Rape, and Kick questionnaire    Fear of Current or Ex-Partner: No    Emotionally Abused: No    Physically Abused: No    Sexually Abused: No    Review of Systems:    Constitutional: No weight loss, fever, chills, weakness or fatigue HEENT: Eyes: No change in vision               Ears, Nose, Throat:  No change in hearing or congestion Skin: No rash or itching Cardiovascular: No chest pain, chest pressure or palpitations   Respiratory: No SOB or cough Gastrointestinal: See HPI and otherwise negative Genitourinary: No dysuria or change in urinary frequency Neurological: No headache, dizziness or syncope Musculoskeletal: No new muscle or joint pain Hematologic: No bleeding or bruising Psychiatric: No history of depression or anxiety    Physical Exam:  Vital signs: There were no vitals taken for this visit.  Constitutional: NAD, Well developed, Well nourished, alert and cooperative Head:  Normocephalic and atraumatic. Eyes:   PEERL, EOMI. No icterus. Conjunctiva pink. Respiratory: Respirations even and unlabored. Lungs clear to auscultation bilaterally.   No wheezes, crackles, or rhonchi.  Cardiovascular:  Regular rate and rhythm. No peripheral edema, cyanosis or pallor.  Gastrointestinal:  Soft, nondistended, nontender. No rebound or  guarding. Normal bowel sounds. No appreciable masses or hepatomegaly. Rectal:  Not performed.  Msk:  Symmetrical without gross deformities. Without edema, no deformity or joint abnormality.  Neurologic:  Alert and  oriented x4;  grossly normal neurologically.  Skin:   Dry and intact without significant lesions or rashes. Psychiatric: Oriented to person, place and time. Demonstrates good judgement and reason without abnormal affect or behaviors.   RELEVANT LABS AND IMAGING: CBC    Component Value Date/Time   WBC 3.7 (L) 07/08/2022 1554   RBC 3.73 (L) 07/08/2022 1554   HGB 11.9 (L) 07/08/2022 1554   HGB 13.4 10/30/2020 1214   HCT 36.1 07/08/2022 1554   HCT 39.5 10/30/2020 1214   PLT 154.0 07/08/2022 1554   PLT 179 10/30/2020 1214   MCV 96.9 07/08/2022 1554   MCV 93 10/30/2020 1214   MCH 31.4 03/28/2021 1733   MCHC 32.8 07/08/2022 1554   RDW 12.5 07/08/2022 1554   RDW 11.8 10/30/2020 1214   LYMPHSABS 1.2 07/08/2022 1554   LYMPHSABS 2.1 05/24/2017 1537   MONOABS 0.3 07/08/2022 1554   EOSABS 0.0 07/08/2022 1554   EOSABS 0.2 05/24/2017 1537   BASOSABS 0.0 07/08/2022 1554   BASOSABS 0.0 05/24/2017 1537    CMP     Component Value Date/Time   NA 140 07/08/2022 1554   NA 143 10/30/2020 1214   K 4.8 07/08/2022 1554   CL 100 07/08/2022 1554   CO2 32 07/08/2022 1554   GLUCOSE 95 07/08/2022 1554   BUN 29 (H) 07/08/2022 1554   BUN 31 10/30/2020 1214   CREATININE 1.16 07/08/2022 1554   CREATININE 1.10 (H) 02/20/2015 1447   CALCIUM 9.1 07/08/2022 1554   PROT 6.6 07/08/2022 1554   ALBUMIN 4.4 07/08/2022 1554   AST 26 07/08/2022 1554   ALT 21 07/08/2022 1554   ALKPHOS 61 07/08/2022 1554   BILITOT 0.4 07/08/2022 1554  GFRNONAA 40 (L) 03/28/2021 1733   GFRAA 41 (L) 08/15/2019 1454    Assessment: 1. ***  Plan: 1. ***     Lara Mulch  Gastroenterology 08/18/2022, 9:18 AM  Cc: [name], MD

## 2022-08-19 ENCOUNTER — Encounter: Payer: Self-pay | Admitting: Gastroenterology

## 2022-08-19 ENCOUNTER — Other Ambulatory Visit (HOSPITAL_COMMUNITY): Payer: Self-pay | Admitting: *Deleted

## 2022-08-19 ENCOUNTER — Ambulatory Visit: Payer: Medicare Other | Admitting: Gastroenterology

## 2022-08-19 VITALS — BP 128/64 | HR 74 | Ht 66.75 in | Wt 103.8 lb

## 2022-08-19 DIAGNOSIS — R1310 Dysphagia, unspecified: Secondary | ICD-10-CM

## 2022-08-19 DIAGNOSIS — R197 Diarrhea, unspecified: Secondary | ICD-10-CM

## 2022-08-19 DIAGNOSIS — R634 Abnormal weight loss: Secondary | ICD-10-CM

## 2022-08-19 MED ORDER — OMEPRAZOLE 20 MG PO CPDR: 20.0000 mg | DELAYED_RELEASE_CAPSULE | Freq: Every day | ORAL | 2 refills | Status: DC

## 2022-08-19 NOTE — Patient Instructions (Addendum)
If your blood pressure at your visit was 140/90 or greater, please contact your primary care physician to follow up on this. ______________________________________________________  If you are age 87 or older, your body mass index should be between 23-30. Your Body mass index is 16.38 kg/m. If this is out of the aforementioned range listed, please consider follow up with your Primary Care Provider.  If you are age 58 or younger, your body mass index should be between 19-25. Your Body mass index is 16.38 kg/m. If this is out of the aformentioned range listed, please consider follow up with your Primary Care Provider.  ________________________________________________________  The [place] [facility] providers would like to encourage you to use [facility] to communicate with providers for non-urgent requests or questions.  Due to long hold times on the telephone, sending your provider a message by [facility] may be a faster and more efficient way to get a response.  Please allow 48 business hours for a response.  Please remember that this is for non-urgent requests.  _______________________________________________________  Due to recent changes in healthcare laws, you may see the results of your imaging and laboratory studies on MyChart before your provider has had a chance to review them.  We understand that in some cases there may be results that are confusing or concerning to you. Not all laboratory results come back in the same time frame and the provider may be waiting for multiple results in order to interpret others.  Please give [place] 48 hours in order for your provider to thoroughly review all the results before contacting the office for clarification of your results.   We have sent the following medications to your pharmacy for you to pick up at your convenience: Omeprazole 20 mg: Take once daily  You have been scheduled for a modified barium swallow on Friday, 7-26th at 1:00pm. Please arrive 15 minutes prior  to your test for registration. You will go to [facility] (1st Floor) for your appointment. Should you need to cancel or reschedule your appointment, please contact [phone] Patrcia Dolly Alcolu) or [phone] [facility]). _____________________________________________________________________ A Modified Barium Swallow Study, or MBS, is a special x-ray that is taken to check swallowing skills. It is carried out by a Marine scientist and a Warehouse manager (SLP). During this test, yourmouth, throat, and esophagus, a muscular tube which connects your mouth to your stomach, is checked. The test will help you, your doctor, and the SLP plan what types of foods and liquids are easier for you to swallow. The SLP will also identify positions and ways to help you swallow more easily and safely. What will happen during an MBS? You will be taken to an x-ray room and seated comfortably. You will be asked to swallow small amounts of food and liquid mixed with barium. Barium is a liquid or paste that allows images of your mouth, throat and esophagus to be seen on x-ray. The x-ray captures moving images of the food you are swallowing as it travels from your mouth through your throat and into your esophagus. This test helps identify whether food or liquid is entering your lungs (aspiration). The test also shows which part of your mouth or throat lacks strength or coordination to move the food or liquid in the right direction. This test typically takes 30 minutes to 1 hour to complete. _______________________________________________________________________   It was a pleasure to see you today!  Thank you for trusting me with your gastrointestinal care!

## 2022-08-23 ENCOUNTER — Telehealth: Payer: Self-pay | Admitting: Gastroenterology

## 2022-08-23 NOTE — Telephone Encounter (Signed)
Spoke to patient who states that she cannot go to an appointment for MBS before 2 pm due to transportation limitations. Patient states she attempted to contact speech pathology at the [phone] number but was unable to speak with anyone. I contacted Bjorn Loser at Speech path and Bjorn Loser says she will contact patient to reschedule.

## 2022-08-23 NOTE — Telephone Encounter (Signed)
Inbound call from patient requesting to speak with a nurse in regards a appointment [facility] made for her. Patient stated she can't go at 1:00 pm and need a different time but dont have a phone number to call and change it. Please advise.

## 2022-08-25 DIAGNOSIS — H6123 Impacted cerumen, bilateral: Secondary | ICD-10-CM | POA: Diagnosis not present

## 2022-08-25 DIAGNOSIS — H903 Sensorineural hearing loss, bilateral: Secondary | ICD-10-CM | POA: Diagnosis not present

## 2022-09-01 ENCOUNTER — Telehealth: Payer: Self-pay | Admitting: Internal Medicine

## 2022-09-01 NOTE — Telephone Encounter (Signed)
Patient would like the nurse to call her because she is having trouble breathing and coughing.  Please call asap to speak with nurse further.  CB# [phone]

## 2022-09-01 NOTE — Telephone Encounter (Signed)
Spoke with patient regarding SOB. Patient stated SOB has been going on for about 2.5-3 weeks now. Patient stated her Elwin Sleight was working but now doesn't seem to work. She has been using her Ventolin every 6 hours PRN .Patient would like to know what she can do.   Dr.Desai can you please advise   Thank you

## 2022-09-03 ENCOUNTER — Ambulatory Visit (HOSPITAL_COMMUNITY)
Admission: RE | Admit: 2022-09-03 | Discharge: 2022-09-03 | Disposition: A | Discharge status: Home / Self Care | Payer: Medicare Other | Source: Ambulatory Visit | Attending: Internal Medicine | Admitting: Internal Medicine

## 2022-09-03 ENCOUNTER — Encounter (HOSPITAL_COMMUNITY): Payer: Medicare Other

## 2022-09-03 ENCOUNTER — Telehealth: Payer: Medicare Other | Admitting: Internal Medicine

## 2022-09-03 DIAGNOSIS — K219 Gastro-esophageal reflux disease without esophagitis: Secondary | ICD-10-CM | POA: Diagnosis not present

## 2022-09-03 DIAGNOSIS — R1310 Dysphagia, unspecified: Secondary | ICD-10-CM | POA: Diagnosis not present

## 2022-09-03 DIAGNOSIS — R059 Cough, unspecified: Secondary | ICD-10-CM | POA: Diagnosis not present

## 2022-09-03 NOTE — Telephone Encounter (Signed)
I did confirm with patient that she is taking Dulera 2 puffs twice daily . Patient stated she does have a alittle wheezing still having SOB no fever or cough  . Patient stated she has been using the Ventolin once a day.   Patient can not do video visit today she has a office visit with Speech therapist at 2:00 pm today .

## 2022-09-03 NOTE — Telephone Encounter (Signed)
ATC left detailed message recommended by Dr Celine Mans. Will leave encounter open.

## 2022-09-03 NOTE — Telephone Encounter (Signed)
Can you confirm that she is taking dulera 2 puffs twice daily and 2 puffs of albuterol   Is she having fevers, cough, productive? Wheezing?   I would want to do a video visit to see if she needs prednisone. Is she able to do one today at 3pm?

## 2022-09-03 NOTE — Telephone Encounter (Signed)
Recommend she take albuterol 2 puffs up to 4 times daily for wheezing and shortness of breath. If not improved over the weekend can make acute visit appointment with me at 9am - ok to double book a virtual visit.

## 2022-09-06 NOTE — Telephone Encounter (Signed)
Called and spoke with patient.  Patient stated she did receive message on VM last week and followed Dr. Humphrey Rolls recommendations.  Patient stated she feels much better.  Nothing further at this time.

## 2022-09-09 ENCOUNTER — Ambulatory Visit (INDEPENDENT_AMBULATORY_CARE_PROVIDER_SITE_OTHER): Payer: Medicare Other | Admitting: Internal Medicine

## 2022-09-09 ENCOUNTER — Encounter: Payer: Self-pay | Admitting: Internal Medicine

## 2022-09-09 VITALS — BP 116/68 | HR 83 | Temp 97.8°F | Ht 66.75 in | Wt 102.0 lb

## 2022-09-09 DIAGNOSIS — E039 Hypothyroidism, unspecified: Secondary | ICD-10-CM | POA: Diagnosis not present

## 2022-09-09 DIAGNOSIS — Z0001 Encounter for general adult medical examination with abnormal findings: Secondary | ICD-10-CM | POA: Diagnosis not present

## 2022-09-09 DIAGNOSIS — N184 Chronic kidney disease, stage 4 (severe): Secondary | ICD-10-CM

## 2022-09-09 DIAGNOSIS — K5904 Chronic idiopathic constipation: Secondary | ICD-10-CM

## 2022-09-09 DIAGNOSIS — E778 Other disorders of glycoprotein metabolism: Secondary | ICD-10-CM

## 2022-09-09 DIAGNOSIS — D500 Iron deficiency anemia secondary to blood loss (chronic): Secondary | ICD-10-CM | POA: Diagnosis not present

## 2022-09-09 DIAGNOSIS — I10 Essential (primary) hypertension: Secondary | ICD-10-CM

## 2022-09-09 DIAGNOSIS — I5032 Chronic diastolic (congestive) heart failure: Secondary | ICD-10-CM

## 2022-09-09 LAB — IBC + FERRITIN
Ferritin: 131.7 ng/mL (ref 10.0–291.0)
Iron: 50 ug/dL (ref 42–145)
Saturation Ratios: 12.7 % — ABNORMAL LOW (ref 20.0–50.0)
TIBC: 393.4 ug/dL (ref 250.0–450.0)
Transferrin: 281 mg/dL (ref 212.0–360.0)

## 2022-09-09 LAB — CBC WITH DIFFERENTIAL/PLATELET
Basophils Absolute: 0 10*3/uL (ref 0.0–0.1)
Basophils Relative: 0.5 % (ref 0.0–3.0)
Eosinophils Absolute: 0 10*3/uL (ref 0.0–0.7)
Eosinophils Relative: 0.5 % (ref 0.0–5.0)
HCT: 39.3 % (ref 36.0–46.0)
Hemoglobin: 13 g/dL (ref 12.0–15.0)
Lymphocytes Relative: 25.1 % (ref 12.0–46.0)
Lymphs Abs: 1.1 10*3/uL (ref 0.7–4.0)
MCHC: 33 g/dL (ref 30.0–36.0)
MCV: 97 fl (ref 78.0–100.0)
Monocytes Absolute: 0.3 10*3/uL (ref 0.1–1.0)
Monocytes Relative: 7.6 % (ref 3.0–12.0)
Neutro Abs: 2.9 10*3/uL (ref 1.4–7.7)
Neutrophils Relative %: 66.3 % (ref 43.0–77.0)
Platelets: 178 10*3/uL (ref 150.0–400.0)
RBC: 4.05 Mil/uL (ref 3.87–5.11)
RDW: 12.3 % (ref 11.5–15.5)
WBC: 4.3 10*3/uL (ref 4.0–10.5)

## 2022-09-09 LAB — BASIC METABOLIC PANEL
BUN: 34 mg/dL — ABNORMAL HIGH (ref 6–23)
CO2: 33 mEq/L — ABNORMAL HIGH (ref 19–32)
Calcium: 9.5 mg/dL (ref 8.4–10.5)
Chloride: 95 mEq/L — ABNORMAL LOW (ref 96–112)
Creatinine, Ser: 1.17 mg/dL (ref 0.40–1.20)
GFR: 39.43 mL/min — ABNORMAL LOW (ref >60.00)
Glucose, Bld: 91 mg/dL (ref 70–99)
Potassium: 5.4 mEq/L — ABNORMAL HIGH (ref 3.5–5.1)
Sodium: 136 mEq/L (ref 135–145)

## 2022-09-09 LAB — TSH: TSH: 6.07 u[IU]/mL — ABNORMAL HIGH (ref 0.35–5.50)

## 2022-09-09 LAB — MAGNESIUM: Magnesium: 2.2 mg/dL (ref 1.5–2.5)

## 2022-09-09 MED ORDER — FUROSEMIDE 40 MG PO TABS
ORAL_TABLET | ORAL | 0 refills | Status: DC
Start: 2022-09-09 — End: 2022-10-06

## 2022-09-09 NOTE — Patient Instructions (Signed)

## 2022-09-09 NOTE — Progress Notes (Signed)
Subjective:  Patient ID: Nolon Bussing, female    DOB: [birth year]  Age: 89 y.o. MRN: [id number]  CC: Anemia, Annual Exam, Hypertension, Osteoarthritis, Gastroesophageal Reflux, and Hypothyroidism   HPI Cleatus Goodin presents for a CPX and f/up -----  Discussed the use of AI scribe software for clinical note transcription with the patient, who gave verbal consent to proceed.  History of Present Illness   The patient presents with a history of multiple health issues, including swelling in the left leg and ankle, cramps in fingers and hands, and a gravelly throat. The swelling, which began approximately two weeks ago, is unusual as it occurs even when the patient is inactive. The patient has been using a CBD cream to manage the swelling, which they report as effective.  The patient also reports neuropathy. The patient has been diagnosed with GERD and has been on omeprazole for seven days, which was prescribed to manage acid reflux that is suspected to be causing inflammation in the voice box. The patient reports a significant amount of gas but denies any presence of blood.  The patient also reports occasional difficulty swallowing, particularly on the right side of the throat, and has to position pills on the left side to swallow them.  The patient has been experiencing abdominal pain and cramping, which they attribute to gas. They report a severe episode of gas and abdominal pain that lasted about an hour, during which they had difficulty breathing. The patient also reports constipation, going without bowel movements for up to five days, which they believe may be contributing to their abdominal distension and discomfort.  The patient also reports occasional dizziness and cramping in the legs, which they suspect may be due to low magnesium levels. They are currently taking a daily magnesium supplement. The patient also has a history of diverticulitis, but denies any current symptoms  associated with this condition.  The patient is currently on a regimen of various medications, including a water pill for an unspecified condition, a thyroid pill for thyroid problems, and previously took Tylenol for arthritis-related back pain. They have stopped taking Tylenol as the back pain seems to resolve on its own after some morning activity. The patient also takes Benefiber for IBS and reports that it has been helpful in managing their symptoms.       Outpatient Medications Prior to Visit  Medication Sig Dispense Refill   acetaminophen (TYLENOL) 650 MG CR tablet Take 1,300 mg by mouth 2 (two) times daily.     albuterol (VENTOLIN HFA) 108 (90 Base) MCG/ACT inhaler Inhale 2 puffs into the lungs every 6 (six) hours as needed. 18 g 5   diazepam (VALIUM) 2 MG tablet Take 1 tablet (2 mg total) by mouth every 8 (eight) hours as needed for anxiety. 90 tablet 5   ELIQUIS 2.5 MG TABS tablet TAKE 1 TABLET BY MOUTH TWICE A DAY 180 tablet 1   Homeopathic Products ([employer] EAR PAIN RELIEF OT) Place 3 drops into the left ear as needed (Pain).     levothyroxine (SYNTHROID) 50 MCG tablet Take 1 tablet (50 mcg total) by mouth daily before breakfast. 90 tablet 1   MISC NATURAL PRODUCTS EX Apply 1 application topically in the morning and at bedtime. CBD cream for back and ankle pain     mometasone-formoterol (DULERA) 100-5 MCG/ACT AERO Inhale 2 puffs into the lungs 2 (two) times daily.     omeprazole (PRILOSEC) 20 MG capsule Take 1 capsule (20 mg total) by  mouth daily. 30 capsule 2   Propylene Glycol (SYSTANE COMPLETE OP) Apply 1 drop to eye as needed (dryness).     sodium chloride (OCEAN) 0.65 % SOLN nasal spray Place 1 spray into both nostrils as needed for congestion.     Wheat Dextrin (BENEFIBER PO) Take 1 Dose by mouth daily.     furosemide (LASIX) 40 MG tablet TAKE 1 TABLET BY MOUTH DAILY; MAY TAKE ADDITIONAL HALF TABLET AS NEEDED FOR WEIGHT GAIN 135 tablet 0   potassium chloride (KLOR-CON M) 10 MEQ  tablet TAKE 1 TABLET BY MOUTH DAILY 90 tablet 0   No facility-administered medications prior to visit.    ROS Review of Systems  Objective:  BP 116/68 (BP Location: Left Arm, Patient Position: Sitting, Cuff Size: Normal)   Pulse 83   Temp 97.8 F (36.6 C) (Oral)   Ht 5' 6.75" (1.695 m)   Wt 102 lb (46.3 kg)   SpO2 96%   BMI 16.10 kg/m   BP Readings from Last 3 Encounters:  09/09/22 116/68  08/19/22 128/64  07/08/22 124/80    Wt Readings from Last 3 Encounters:  09/09/22 102 lb (46.3 kg)  08/19/22 103 lb 12.8 oz (47.1 kg)  07/08/22 102 lb (46.3 kg)    Physical Exam Vitals reviewed.  Constitutional:      General: She is not in acute distress.    Appearance: She is not ill-appearing, toxic-appearing or diaphoretic.  HENT:     Mouth/Throat:     Mouth: Mucous membranes are moist.  Eyes:     General: No scleral icterus.    Conjunctiva/sclera: Conjunctivae normal.  Cardiovascular:     Rate and Rhythm: Normal rate and regular rhythm.     Heart sounds: Murmur heard.     Systolic murmur is present with a grade of 1/6.     No gallop.  Pulmonary:     Effort: Pulmonary effort is normal.     Breath sounds: No stridor. No wheezing, rhonchi or rales.  Abdominal:     General: Abdomen is flat.     Palpations: There is no mass.     Tenderness: There is no abdominal tenderness. There is no guarding.     Hernia: No hernia is present.  Musculoskeletal:     Cervical back: Neck supple.     Right lower leg: Edema present.     Left lower leg: Edema present.     Comments: Trace pitting edema over both ankles  Lymphadenopathy:     Cervical: No cervical adenopathy.  Skin:    General: Skin is warm and dry.     Coloration: Skin is not pale.  Neurological:     General: No focal deficit present.     Mental Status: She is alert.  Psychiatric:        Mood and Affect: Mood normal.        Behavior: Behavior normal.     Lab Results  Component Value Date   WBC 4.3 09/09/2022    HGB 13.0 09/09/2022   HCT 39.3 09/09/2022   PLT 178.0 09/09/2022   GLUCOSE 91 09/09/2022   CHOL 155 02/11/2014   TRIG 84.0 02/11/2014   HDL 46.10 02/11/2014   LDLCALC 92 02/11/2014   ALT 21 07/08/2022   AST 26 07/08/2022   NA 136 09/09/2022   K 5.4 No hemolysis seen (H) 09/09/2022   CL 95 (L) 09/09/2022   CREATININE 1.17 09/09/2022   BUN 34 (H) 09/09/2022   CO2 33 (H)  09/09/2022   TSH 6.07 (H) 09/09/2022   INR 1.3 (A) 03/17/2020   HGBA1C 5.7 06/12/2019    DG SWALLOW FUNC OP MEDICARE SPEECH PATH  Result Date: 09/03/2022 Table formatting from the original result was not included. Modified Barium Swallow Study Patient Details Name: Clydie Dillen MRN: [id number] Date of Birth: [birth year] Today's Date: 09/03/2022 HPI/PMH: HPI: pt is a 89 yo female referred by GI NP for MBS. Pt has PMH + for anxiety, dysphagia -- concerning from thyroid mass, renal insufficiency, esophageal narrowing s/p dilatation, GERD, Afib, IBS, MVP, osteoporosis and h/o asthma.  She underwent upper gastrointenstinal endoscopy 09/06/2020.  Medication list includes Align, Synthroid.  Reports largest difficulty is with pills and liquids causing coughing, choking but also some diffiuclty with solids.  She reports globus - MBS ordered.  Last chest imaging showed Trace bilateral pleural effusions. Clinical Impression: Clinical Impression: Patient presents with functional oropharyngeal swallow based on her current age.  Swallow function characterized by anterior curvature of tip of epiglottis contributing to some trapping of barium.  Decreased laryngeal elevation and closure resulting in mild laryngeal penetration of thin liquid, to the vocal cords x 1 that cleared with cued throat clear.  Patient did not aspirate despite being taxed with sequential swallows of thin liquids.  Various compensation strategies attempted due to patient complaint of sensation of retention on the right side of her pharynx including head turn to the right,   head turn combined with chin tuck, chin tuck alone of which none were effective.  An anterior posterior view moderate tension appeared on right vallecular region which is where patient senses retention.  Of note patient did cough during evaluation without aspiration.  She was observed to sniff complaining of secretions in nasal region and pharynx.  Patient reports that she took omeprazole for 4 days only due to her suspicion it was exacerbating her asthma after it was prescribed in July 2024.  She does endorse that it appeared to help her swallowing ability however.  Encouraged her to speak to her GI MD, NP about these issues.  Many of her dysphagia symptoms including hoarseness, difficulty swallowing, globus sensation may be consistent with LPR.  Educated patient and her friend thoroughly to findings following testing using fluoroscopy loops.  Advised patient look into purchasing antichoking device such as a life-Vac given her report of coughing and choking with intake, especially given her advanced age.  No follow-up SLP indicated.  Thank you very much for this referral. Factors that may increase risk of adverse event in presence of aspiration Rubye Oaks [employer] Clearance Coots 2021): No data recorded Recommendations/Plan: Swallowing Evaluation Recommendations Swallowing Evaluation Recommendations Recommendations: PO diet PO Diet Recommendation: Regular; Thin liquids (Level 0) Liquid Administration via: Cup; Straw Medication Administration: Other (Comment) (As tolerated) Supervision: Patient able to self-feed Swallowing strategies  : Slow rate; Small bites/sips; Follow solids with liquids (Intermittent throat clear) Postural changes: Position pt fully upright for meals; Stay upright 30-60 min after meals Oral care recommendations: Oral care BID (2x/day) Treatment Plan No data recorded Recommendations Recommendations for follow up therapy are one component of a multi-disciplinary discharge planning process, led by the attending  physician.  Recommendations may be updated based on patient status, additional functional criteria and insurance authorization. Assessment: Orofacial Exam: Orofacial Exam Oral Cavity: Oral Hygiene: WFL Oral Cavity - Dentition: Adequate natural dentition Orofacial Anatomy: WFL Oral Motor/Sensory Function: WFL Anatomy: Anatomy: WFL; Other (Comment) (Anterior position of tip of epiglottis) Boluses Administered: Boluses Administered Boluses Administered: Thin liquids (Level  0); Mildly thick liquids (Level 2, nectar thick); Moderately thick liquids (Level 3, honey thick); Puree; Solid  Oral Impairment Domain: Oral Impairment Domain Lip Closure: No labial escape Tongue control during bolus hold: Cohesive bolus between tongue to palatal seal Bolus preparation/mastication: Timely and efficient chewing and mashing Bolus transport/lingual motion: Brisk tongue motion Oral residue: Trace residue lining oral structures Location of oral residue : Tongue Initiation of pharyngeal swallow : Pyriform sinuses  Pharyngeal Impairment Domain: Pharyngeal Impairment Domain Soft palate elevation: No bolus between soft palate (SP)/pharyngeal wall (PW) Laryngeal elevation: Partial superior movement of thyroid cartilage/partial approximation of arytenoids to epiglottic petiole Anterior hyoid excursion: Partial anterior movement Epiglottic movement: Partial inversion Laryngeal vestibule closure: Incomplete, narrow column air/contrast in laryngeal vestibule Pharyngeal stripping wave : Present - complete Pharyngeal contraction (A/P view only): Complete Pharyngoesophageal segment opening: Complete distension and complete duration, no obstruction of flow Tongue base retraction: Trace column of contrast or air between tongue base and PPW Pharyngeal residue: Trace residue within or on pharyngeal structures Location of pharyngeal residue: Valleculae; Pyriform sinuses  Esophageal Impairment Domain: Esophageal Impairment Domain Esophageal clearance  upright position: Complete clearance, esophageal coating Pill: No data recorded Penetration/Aspiration Scale Score: Penetration/Aspiration Scale Score 1.  Material does not enter airway: Moderately thick liquids (Level 3, honey thick); Puree; Solid; Mildly thick liquids (Level 2, nectar thick) 5.  Material enters airway, CONTACTS cords and not ejected out: Thin liquids (Level 0) Compensatory Strategies: Compensatory Strategies Compensatory strategies: Yes Effortful swallow: Ineffective Multiple swallows: Effective Effective Multiple Swallows: Thin liquid (Level 0); Mildly thick liquid (Level 2, nectar thick) Chin tuck: Ineffective Ineffective Chin Tuck: Puree; Solid Liquid wash: Effective Effective Liquid Wash: Puree; Solid Left head turn: Ineffective Ineffective Left Head Turn: Puree Right head turn: Ineffective Ineffective Right Head Turn: Puree Other(comment): -- (expectorate effective)   General Information: Caregiver present: -- (Friend present who drove patient for testing)  Diet Prior to this Study: Regular; Thin liquids (Level 0)   No data recorded  Respiratory Status: WFL   Supplemental O2: None (Room air)   History of Recent Intubation: No  Behavior/Cognition: Alert; Cooperative; Pleasant mood Self-Feeding Abilities: Able to self-feed Baseline vocal quality/speech: Dysphonic Volitional Cough: Able to elicit Volitional Swallow: Able to elicit Exam Limitations: No limitations Goal Planning: No data recorded No data recorded No data recorded No data recorded No data recorded Pain: No data recorded End of Session: Start Time:SLP Start Time (ACUTE ONLY): 1420 Stop Time: SLP Stop Time (ACUTE ONLY): 1459 Time Calculation:SLP Time Calculation (min) (ACUTE ONLY): 39 min Charges: SLP Evaluations $ SLP Speech Visit: 1 Visit SLP Evaluations $Outpatient MBS Swallow: 1 Procedure $Swallowing Treatment: 1 Procedure SLP visit diagnosis: SLP Visit Diagnosis: Dysphagia, unspecified (R13.10) Past Medical History: Past Medical  History: Diagnosis Date  Anxiety   Arthritis   Atrial fibrillation (HCC)   pacemaker, chronic anticoag  Breast cancer (HCC)   Diverticulosis   Fibroma   inner lips/mouth  GERD (gastroesophageal reflux disease)   Hx of breast cancer   Hyperkalemia   Hypertension   Hypothyroidism   IBS (irritable bowel syndrome)   Internal hemorrhoids   Left inguinal hernia   direct  MVP (mitral valve prolapse)   Osteoporosis   Renal insufficiency   Status post dilation of esophageal narrowing   Thyroid cancer [facility])  Past Surgical History: Past Surgical History: Procedure Laterality Date  COLONOSCOPY  10/28/2005  internal hemorrhoids, diverticulosis (same as in 2002 and random bxs negative then)  EP IMPLANTABLE DEVICE N/A 02/26/2015  Procedure: PPM Generator Changeout;  Surgeon: Duke Salvia, MD;  Location[facility] INVASIVE CV LAB;  Service: Cardiovascular;  Laterality: N/A;  MASTECTOMY  1982  Bilateral  PACEMAKER INSERTION    THYROIDECTOMY    TONSILLECTOMY    UPPER GASTROINTESTINAL ENDOSCOPY  09/06/2000  normal Rolena Infante, MS [facility] SLP Acute Rehab Services Office [phone] Chales Abrahams 09/03/2022, 8:07 PM CLINICAL DATA:  Dysphagia. Cough/GE reflux disease/other secondary diagnosis EXAM: MODIFIED BARIUM SWALLOW TECHNIQUE: Different consistencies of barium were administered orally to the patient by the Speech Pathologist. Imaging of the pharynx was performed in the lateral projection. The radiologist was present in the fluoroscopy room for this study, providing personal supervision. FLUOROSCOPY: Radiation Exposure Index (as provided by the fluoroscopic device): 6.4 mGy Kerma COMPARISON:  None Available. FINDINGS: Vestibular  Penetration:  Minimal flash penetration Aspiration:  None seen. Other:  Mild vallecular pooling. IMPRESSION: No aspiration.  See speech pathology report. Please refer to the Speech Pathologists report for complete details and recommendations. Electronically Signed   By: Genevive Bi M.D.   On: 09/03/2022  15:26   Assessment & Plan:   Chronic kidney disease, stage 4, severely decreased GFR (HCC)- Her renal function is stable. -     Basic metabolic panel; Future  Encounter for general adult medical examination with abnormal findings- Exam completed, labs reviewed, vaccines reviewed and updated, cancer screenings not indicated, pt ed material was given.   Essential hypertension, benign- Her blood pressure is well-controlled.  Potassium is high so I have discontinued the potassium supplement.  Will continue the loop diuretic. -     Basic metabolic panel; Future  Acquired hypothyroidism- She is euthyroid. -     TSH; Future  Hypoproteinemia (HCC)  Iron deficiency anemia due to chronic blood loss- H/H are normal. -     CBC with Differential/Platelet; Future -     IBC + Ferritin; Future  Chronic idiopathic constipation -     Magnesium; Future -     Basic metabolic panel; Future -     TSH; Future  Chronic heart failure with preserved ejection fraction (HCC) -     Furosemide; TAKE 1 TABLET BY MOUTH DAILY; MAY TAKE ADDITIONAL HALF TABLET AS NEEDED FOR WEIGHT GAIN  Dispense: 135 tablet; Refill: 0     Follow-up: Return in about 6 months (around 03/12/2023).  Sanda Linger, MD

## 2022-09-11 ENCOUNTER — Telehealth: Payer: Self-pay | Admitting: Gastroenterology

## 2022-09-11 DIAGNOSIS — R1310 Dysphagia, unspecified: Secondary | ICD-10-CM

## 2022-09-11 NOTE — Telephone Encounter (Signed)
Patient called stating this morning she ate some oatmeal and said it felt like it got stuck in the right side of her throat.  She saw [facility] in July from some dysphagia.  She had a modified barium swallow study that was okay maybe she had some silent reflux.  Sounds like maybe she should have an esophagram/upper GI series just to evaluate things, but I will let you sort it out.  I told her I would pass along and that the office would out reach her early this next week.  Otherwise she did get the oatmeal food to pass.  Is not getting hung in her chest.  Told her lots of liquids and very soft foods for the next several days.

## 2022-09-13 NOTE — Telephone Encounter (Signed)
Patient calls with complaints of worsening right sided dysphagia over the weekend. States that food/pills get stuck and she has to "really work to get it down." Also c/o hoarseness and worsening cough as well as globus sensation. Patient states that it can take 1-2 hours for her to get even 2 or 3 bites of food down and at times, she has difficulty breathing with this. I have advised that fortunately, her recent barium swallow did not show any aspiration into the lungs but did suggest some possible silent reflux/LPR. Patient is advised that since she was not able to take omeprazole (she says this caused her asthma to flare), she may benefit from an antacid such as pepcid or a PPI like pantoprazole. Patient states that at the recommendation of Dr Yetta Barre, she has continued omeprazole but has noticed no difference in the last several days. Says that she needs immediate attention and cannot try any other recommendations due to the severity of her symptoms.   I advised that if she is truly having difficulty breathing or is unable to keep secretions down, she will need to go to the emergency room for more urgent evaluation. Otherwise, advised patient to continue very small frequent meals, small bites, chewing well, frequent sips of liquids and staying upright for at least 60 minutes after eating/drinking. In addition, suggested chin tucks to aid in swallowing and will get further recommendations from [facility] on her return to the office tomorrow. Patient verbalizes understanding of this information.

## 2022-09-13 NOTE — Telephone Encounter (Signed)
PT returning call to find out if someone can help her as far as her throat closing. Please advise.

## 2022-09-14 MED ORDER — FAMOTIDINE 40 MG PO TABS: 40.0000 mg | ORAL_TABLET | Freq: Every day | ORAL | 3 refills | Status: DC

## 2022-09-14 NOTE — Telephone Encounter (Signed)
I have spoken to patient to advise of Bayley's recommendations to begin pepcid 40 mg every night and to have upper GI series for further evaluation of dysphagia. Also advised for severe symptoms, she should go to the ER as previously recommended.  Patient is agreeable to this and is scheduled for upper GI series at [facility] on 09/20/22 at 130 pm with 100 pm arrival. She is advised to remain NPO 8 hours prior to her test. Rx for pepcid has also been sent to the pharmacy.  Of note, patient states that she has been doing the "chin tucks" when eating over the last couple of meals and they have helped her swallowing tremendously.

## 2022-09-14 NOTE — Addendum Note (Signed)
Addended by: Richardson Chiquito on: 09/14/2022 10:08 AM   Modules accepted: Orders

## 2022-09-15 ENCOUNTER — Telehealth: Payer: Self-pay | Admitting: Gastroenterology

## 2022-09-15 NOTE — Telephone Encounter (Signed)
Inbound call from patient stating pepcid medication has been making her very dizzy and feeling "strange" patient requesting a call back to discuss further. Please advise, thank you.

## 2022-09-15 NOTE — Telephone Encounter (Signed)
Patient states that she took famotidine last night and since last night has had generalized tiredness, muscle fatigue and some dizziness. Advised that she may hold the famotidine for a couple days to see if symptoms improve. If they do not change, we know famotidine is not the cause and she would be able to restart the medication. Also advised that Dr Yetta Barre recently sent her a mychart message advising of high potassium and her need to discontinue potassium supplementation at this time as he felt the hyperkalemia may be the cause of some of her muscle cramping. Patient states that she has discontinued this. Advised patient that should she notice any changes in heart rhythm/palpitations/chest pain etc, she needs to seek immediate help. Patient verbalizes understanding of this information.

## 2022-09-16 NOTE — Telephone Encounter (Signed)
Patient called asking if if would be ok to take her Ensure plant based supplement with her current dysphagia. I advised that this should be okay as long as she can tolerate it. Advised again that she should take small sips with rest time between swallows. When asked, patient states that she had again had an episode recently of severe dysphagia, gas, and some difficutly with her breathing for about one hour. This has since subsided. Again reminded patient that she should present to the emergency room for difficulty breathing or inability to swallow secretions. She verbalizes understanding.

## 2022-09-16 NOTE — Telephone Encounter (Signed)
Patient called requesting to speak with you states she is having a hard time breathing and asked that you call her back as soon as possible.

## 2022-09-20 ENCOUNTER — Ambulatory Visit (HOSPITAL_COMMUNITY)
Admission: RE | Admit: 2022-09-20 | Discharge: 2022-09-20 | Disposition: A | Discharge status: Home / Self Care | Payer: Medicare Other | Source: Ambulatory Visit | Attending: Gastroenterology | Admitting: Gastroenterology

## 2022-09-20 ENCOUNTER — Other Ambulatory Visit: Payer: Self-pay | Admitting: *Deleted

## 2022-09-20 ENCOUNTER — Other Ambulatory Visit: Payer: Self-pay | Admitting: Gastroenterology

## 2022-09-20 DIAGNOSIS — R09A2 Foreign body sensation, throat: Secondary | ICD-10-CM | POA: Diagnosis not present

## 2022-09-20 DIAGNOSIS — Z8249 Family history of ischemic heart disease and other diseases of the circulatory system: Secondary | ICD-10-CM | POA: Diagnosis not present

## 2022-09-20 DIAGNOSIS — K224 Dyskinesia of esophagus: Secondary | ICD-10-CM | POA: Diagnosis not present

## 2022-09-20 DIAGNOSIS — I4821 Permanent atrial fibrillation: Secondary | ICD-10-CM | POA: Diagnosis not present

## 2022-09-20 DIAGNOSIS — R1310 Dysphagia, unspecified: Secondary | ICD-10-CM

## 2022-09-20 DIAGNOSIS — I700 Atherosclerosis of aorta: Secondary | ICD-10-CM | POA: Diagnosis not present

## 2022-09-20 DIAGNOSIS — I517 Cardiomegaly: Secondary | ICD-10-CM | POA: Diagnosis not present

## 2022-09-20 DIAGNOSIS — Z79890 Hormone replacement therapy (postmenopausal): Secondary | ICD-10-CM | POA: Diagnosis not present

## 2022-09-20 DIAGNOSIS — I081 Rheumatic disorders of both mitral and tricuspid valves: Secondary | ICD-10-CM | POA: Diagnosis not present

## 2022-09-20 DIAGNOSIS — M810 Age-related osteoporosis without current pathological fracture: Secondary | ICD-10-CM | POA: Diagnosis present

## 2022-09-20 DIAGNOSIS — Z7901 Long term (current) use of anticoagulants: Secondary | ICD-10-CM | POA: Diagnosis not present

## 2022-09-20 DIAGNOSIS — R1314 Dysphagia, pharyngoesophageal phase: Secondary | ICD-10-CM | POA: Diagnosis not present

## 2022-09-20 DIAGNOSIS — Z85850 Personal history of malignant neoplasm of thyroid: Secondary | ICD-10-CM | POA: Diagnosis not present

## 2022-09-20 DIAGNOSIS — R079 Chest pain, unspecified: Secondary | ICD-10-CM | POA: Diagnosis not present

## 2022-09-20 DIAGNOSIS — Z853 Personal history of malignant neoplasm of breast: Secondary | ICD-10-CM | POA: Diagnosis not present

## 2022-09-20 DIAGNOSIS — I5032 Chronic diastolic (congestive) heart failure: Secondary | ICD-10-CM | POA: Diagnosis not present

## 2022-09-20 DIAGNOSIS — Z66 Do not resuscitate: Secondary | ICD-10-CM | POA: Diagnosis not present

## 2022-09-20 DIAGNOSIS — R1319 Other dysphagia: Secondary | ICD-10-CM | POA: Diagnosis not present

## 2022-09-20 DIAGNOSIS — Z950 Presence of cardiac pacemaker: Secondary | ICD-10-CM | POA: Diagnosis not present

## 2022-09-20 DIAGNOSIS — J45909 Unspecified asthma, uncomplicated: Secondary | ICD-10-CM | POA: Diagnosis not present

## 2022-09-20 DIAGNOSIS — F411 Generalized anxiety disorder: Secondary | ICD-10-CM | POA: Diagnosis not present

## 2022-09-20 DIAGNOSIS — Z87891 Personal history of nicotine dependence: Secondary | ICD-10-CM | POA: Diagnosis not present

## 2022-09-20 DIAGNOSIS — R531 Weakness: Secondary | ICD-10-CM | POA: Diagnosis not present

## 2022-09-20 DIAGNOSIS — J90 Pleural effusion, not elsewhere classified: Secondary | ICD-10-CM | POA: Diagnosis not present

## 2022-09-20 DIAGNOSIS — N1832 Chronic kidney disease, stage 3b: Secondary | ICD-10-CM | POA: Diagnosis not present

## 2022-09-20 DIAGNOSIS — R1011 Right upper quadrant pain: Secondary | ICD-10-CM | POA: Diagnosis not present

## 2022-09-20 DIAGNOSIS — R7989 Other specified abnormal findings of blood chemistry: Secondary | ICD-10-CM | POA: Diagnosis not present

## 2022-09-20 DIAGNOSIS — K449 Diaphragmatic hernia without obstruction or gangrene: Secondary | ICD-10-CM | POA: Diagnosis not present

## 2022-09-20 DIAGNOSIS — E890 Postprocedural hypothyroidism: Secondary | ICD-10-CM | POA: Diagnosis not present

## 2022-09-20 DIAGNOSIS — E871 Hypo-osmolality and hyponatremia: Secondary | ICD-10-CM | POA: Diagnosis not present

## 2022-09-20 DIAGNOSIS — I130 Hypertensive heart and chronic kidney disease with heart failure and stage 1 through stage 4 chronic kidney disease, or unspecified chronic kidney disease: Secondary | ICD-10-CM | POA: Diagnosis not present

## 2022-09-20 DIAGNOSIS — K219 Gastro-esophageal reflux disease without esophagitis: Secondary | ICD-10-CM | POA: Diagnosis not present

## 2022-09-20 DIAGNOSIS — E861 Hypovolemia: Secondary | ICD-10-CM | POA: Diagnosis not present

## 2022-09-20 DIAGNOSIS — E876 Hypokalemia: Secondary | ICD-10-CM | POA: Diagnosis not present

## 2022-09-20 MED ORDER — OMEPRAZOLE 40 MG PO CPDR: 40.0000 mg | DELAYED_RELEASE_CAPSULE | Freq: Every day | ORAL | 2 refills | Status: AC

## 2022-09-23 ENCOUNTER — Inpatient Hospital Stay (HOSPITAL_COMMUNITY)
Admission: EM | Admit: 2022-09-23 | Discharge: 2022-09-26 | DRG: 392 | Disposition: A | Discharge status: Home / Self Care | Payer: Medicare Other | Attending: Internal Medicine | Admitting: Internal Medicine

## 2022-09-23 ENCOUNTER — Other Ambulatory Visit: Payer: Self-pay

## 2022-09-23 ENCOUNTER — Emergency Department (HOSPITAL_COMMUNITY): Payer: Medicare Other

## 2022-09-23 ENCOUNTER — Telehealth: Payer: Self-pay | Admitting: Gastroenterology

## 2022-09-23 ENCOUNTER — Encounter (HOSPITAL_COMMUNITY): Payer: Self-pay

## 2022-09-23 DIAGNOSIS — R1314 Dysphagia, pharyngoesophageal phase: Principal | ICD-10-CM | POA: Diagnosis present

## 2022-09-23 DIAGNOSIS — Z7951 Long term (current) use of inhaled steroids: Secondary | ICD-10-CM

## 2022-09-23 DIAGNOSIS — I081 Rheumatic disorders of both mitral and tricuspid valves: Secondary | ICD-10-CM | POA: Diagnosis present

## 2022-09-23 DIAGNOSIS — I5032 Chronic diastolic (congestive) heart failure: Secondary | ICD-10-CM | POA: Diagnosis present

## 2022-09-23 DIAGNOSIS — K589 Irritable bowel syndrome without diarrhea: Secondary | ICD-10-CM | POA: Diagnosis present

## 2022-09-23 DIAGNOSIS — Z950 Presence of cardiac pacemaker: Secondary | ICD-10-CM | POA: Diagnosis present

## 2022-09-23 DIAGNOSIS — R1310 Dysphagia, unspecified: Secondary | ICD-10-CM | POA: Diagnosis not present

## 2022-09-23 DIAGNOSIS — Z634 Disappearance and death of family member: Secondary | ICD-10-CM

## 2022-09-23 DIAGNOSIS — K449 Diaphragmatic hernia without obstruction or gangrene: Secondary | ICD-10-CM | POA: Diagnosis present

## 2022-09-23 DIAGNOSIS — I5030 Unspecified diastolic (congestive) heart failure: Secondary | ICD-10-CM | POA: Diagnosis present

## 2022-09-23 DIAGNOSIS — Z85850 Personal history of malignant neoplasm of thyroid: Secondary | ICD-10-CM | POA: Diagnosis not present

## 2022-09-23 DIAGNOSIS — Z87891 Personal history of nicotine dependence: Secondary | ICD-10-CM | POA: Diagnosis not present

## 2022-09-23 DIAGNOSIS — K219 Gastro-esophageal reflux disease without esophagitis: Secondary | ICD-10-CM | POA: Diagnosis present

## 2022-09-23 DIAGNOSIS — F411 Generalized anxiety disorder: Secondary | ICD-10-CM | POA: Diagnosis present

## 2022-09-23 DIAGNOSIS — K224 Dyskinesia of esophagus: Secondary | ICD-10-CM | POA: Diagnosis present

## 2022-09-23 DIAGNOSIS — E876 Hypokalemia: Secondary | ICD-10-CM | POA: Diagnosis present

## 2022-09-23 DIAGNOSIS — E871 Hypo-osmolality and hyponatremia: Secondary | ICD-10-CM | POA: Diagnosis present

## 2022-09-23 DIAGNOSIS — R7989 Other specified abnormal findings of blood chemistry: Secondary | ICD-10-CM | POA: Diagnosis not present

## 2022-09-23 DIAGNOSIS — Z66 Do not resuscitate: Secondary | ICD-10-CM | POA: Diagnosis present

## 2022-09-23 DIAGNOSIS — Z79890 Hormone replacement therapy (postmenopausal): Secondary | ICD-10-CM

## 2022-09-23 DIAGNOSIS — N1832 Chronic kidney disease, stage 3b: Secondary | ICD-10-CM | POA: Insufficient documentation

## 2022-09-23 DIAGNOSIS — Z79899 Other long term (current) drug therapy: Secondary | ICD-10-CM

## 2022-09-23 DIAGNOSIS — I4821 Permanent atrial fibrillation: Secondary | ICD-10-CM | POA: Diagnosis present

## 2022-09-23 DIAGNOSIS — Z880 Allergy status to penicillin: Secondary | ICD-10-CM

## 2022-09-23 DIAGNOSIS — M810 Age-related osteoporosis without current pathological fracture: Secondary | ICD-10-CM | POA: Diagnosis present

## 2022-09-23 DIAGNOSIS — M1990 Unspecified osteoarthritis, unspecified site: Secondary | ICD-10-CM | POA: Diagnosis present

## 2022-09-23 DIAGNOSIS — E039 Hypothyroidism, unspecified: Secondary | ICD-10-CM | POA: Diagnosis present

## 2022-09-23 DIAGNOSIS — Z853 Personal history of malignant neoplasm of breast: Secondary | ICD-10-CM

## 2022-09-23 DIAGNOSIS — Z8249 Family history of ischemic heart disease and other diseases of the circulatory system: Secondary | ICD-10-CM | POA: Diagnosis not present

## 2022-09-23 DIAGNOSIS — Z9013 Acquired absence of bilateral breasts and nipples: Secondary | ICD-10-CM

## 2022-09-23 DIAGNOSIS — E890 Postprocedural hypothyroidism: Secondary | ICD-10-CM | POA: Diagnosis present

## 2022-09-23 DIAGNOSIS — J45909 Unspecified asthma, uncomplicated: Secondary | ICD-10-CM | POA: Diagnosis present

## 2022-09-23 DIAGNOSIS — I130 Hypertensive heart and chronic kidney disease with heart failure and stage 1 through stage 4 chronic kidney disease, or unspecified chronic kidney disease: Secondary | ICD-10-CM | POA: Diagnosis present

## 2022-09-23 DIAGNOSIS — Z7901 Long term (current) use of anticoagulants: Secondary | ICD-10-CM

## 2022-09-23 DIAGNOSIS — E861 Hypovolemia: Secondary | ICD-10-CM | POA: Diagnosis present

## 2022-09-23 DIAGNOSIS — R1319 Other dysphagia: Secondary | ICD-10-CM | POA: Diagnosis not present

## 2022-09-23 LAB — CBC WITH DIFFERENTIAL/PLATELET
Abs Immature Granulocytes: 0.01 10*3/uL (ref 0.00–0.07)
Basophils Absolute: 0 10*3/uL (ref 0.0–0.1)
Basophils Relative: 0 %
Eosinophils Absolute: 0 10*3/uL (ref 0.0–0.5)
Eosinophils Relative: 0 %
HCT: 33.9 % — ABNORMAL LOW (ref 36.0–46.0)
Hemoglobin: 11.6 g/dL — ABNORMAL LOW (ref 12.0–15.0)
Immature Granulocytes: 0 %
Lymphocytes Relative: 23 %
Lymphs Abs: 1.2 10*3/uL (ref 0.7–4.0)
MCH: 32 pg (ref 26.0–34.0)
MCHC: 34.2 g/dL (ref 30.0–36.0)
MCV: 93.4 fL (ref 80.0–100.0)
Monocytes Absolute: 0.4 10*3/uL (ref 0.1–1.0)
Monocytes Relative: 7 %
Neutro Abs: 3.6 10*3/uL (ref 1.7–7.7)
Neutrophils Relative %: 70 %
Platelets: 171 10*3/uL (ref 150–400)
RBC: 3.63 MIL/uL — ABNORMAL LOW (ref 3.87–5.11)
RDW: 11.1 % — ABNORMAL LOW (ref 11.5–15.5)
WBC: 5.3 10*3/uL (ref 4.0–10.5)
nRBC: 0 % (ref 0.0–0.2)

## 2022-09-23 LAB — COMPREHENSIVE METABOLIC PANEL
ALT: 32 U/L (ref 0–44)
AST: 51 U/L — ABNORMAL HIGH (ref 15–41)
Albumin: 3.9 g/dL (ref 3.5–5.0)
Alkaline Phosphatase: 53 U/L (ref 38–126)
Anion gap: 13 (ref 5–15)
BUN: 18 mg/dL (ref 8–23)
CO2: 25 mmol/L (ref 22–32)
Calcium: 8 mg/dL — ABNORMAL LOW (ref 8.9–10.3)
Chloride: 84 mmol/L — ABNORMAL LOW (ref 98–111)
Creatinine, Ser: 1.03 mg/dL — ABNORMAL HIGH (ref 0.44–1.00)
GFR, Estimated: 50 mL/min — ABNORMAL LOW (ref >60)
Glucose, Bld: 93 mg/dL (ref 70–99)
Potassium: 2.7 mmol/L — CL (ref 3.5–5.1)
Sodium: 122 mmol/L — ABNORMAL LOW (ref 135–145)
Total Bilirubin: 0.9 mg/dL (ref 0.3–1.2)
Total Protein: 5.8 g/dL — ABNORMAL LOW (ref 6.5–8.1)

## 2022-09-23 LAB — TROPONIN I (HIGH SENSITIVITY)
Troponin I (High Sensitivity): 24 ng/L — ABNORMAL HIGH (ref <18)
Troponin I (High Sensitivity): 26 ng/L — ABNORMAL HIGH (ref <18)

## 2022-09-23 LAB — LIPASE, BLOOD: Lipase: 46 U/L (ref 11–51)

## 2022-09-23 LAB — MAGNESIUM: Magnesium: 1.9 mg/dL (ref 1.7–2.4)

## 2022-09-23 MED ORDER — LIDOCAINE VISCOUS HCL 2 % MT SOLN
15.0000 mL | Freq: Once | OROMUCOSAL | Status: AC
  Administered 2022-09-23: 15 mL via OROMUCOSAL

## 2022-09-23 MED ORDER — SODIUM CHLORIDE 0.9 % IV SOLN: INTRAVENOUS | Status: AC

## 2022-09-23 MED ORDER — ACETAMINOPHEN 650 MG RE SUPP
650.0000 mg | Freq: Four times a day (QID) | RECTAL | Status: DC | PRN
  Administered 2022-09-24: 650 mg via RECTAL
  Filled 2022-09-23: qty 1

## 2022-09-23 MED ORDER — FLUCONAZOLE IN SODIUM CHLORIDE 200-0.9 MG/100ML-% IV SOLN
200.0000 mg | Freq: Once | INTRAVENOUS | Status: AC
  Administered 2022-09-23: 200 mg via INTRAVENOUS
  Filled 2022-09-23: qty 100

## 2022-09-23 MED ORDER — PANTOPRAZOLE 80MG IVPB - SIMPLE MED
80.0000 mg | Freq: Once | INTRAVENOUS | Status: AC
  Administered 2022-09-23: 80 mg via INTRAVENOUS
  Filled 2022-09-23: qty 80

## 2022-09-23 MED ORDER — POTASSIUM CHLORIDE 10 MEQ/100ML IV SOLN
10.0000 meq | INTRAVENOUS | Status: AC
  Administered 2022-09-23 – 2022-09-24 (×5): 10 meq via INTRAVENOUS
  Filled 2022-09-23 (×5): qty 100

## 2022-09-23 MED ORDER — MOMETASONE FURO-FORMOTEROL FUM 200-5 MCG/ACT IN AERO: 2.0000 | INHALATION_SPRAY | Freq: Two times a day (BID) | RESPIRATORY_TRACT | Status: DC

## 2022-09-23 MED ORDER — LORAZEPAM 2 MG/ML IJ SOLN
0.5000 mg | Freq: Three times a day (TID) | INTRAMUSCULAR | Status: DC | PRN
  Administered 2022-09-24: 0.5 mg via INTRAVENOUS
  Filled 2022-09-23: qty 1

## 2022-09-23 MED ORDER — LACTATED RINGERS IV BOLUS
1000.0000 mL | Freq: Once | INTRAVENOUS | Status: AC
  Administered 2022-09-23: 1000 mL via INTRAVENOUS

## 2022-09-23 MED ORDER — ONDANSETRON HCL 4 MG/2ML IJ SOLN: 4.0000 mg | Freq: Four times a day (QID) | INTRAMUSCULAR | Status: DC | PRN

## 2022-09-23 MED ORDER — HEPARIN SODIUM (PORCINE) 5000 UNIT/ML IJ SOLN
5000.0000 [IU] | Freq: Three times a day (TID) | INTRAMUSCULAR | Status: DC
  Administered 2022-09-24 – 2022-09-26 (×7): 5000 [IU] via SUBCUTANEOUS
  Filled 2022-09-23 (×7): qty 1

## 2022-09-23 MED ORDER — SODIUM CHLORIDE 0.9% FLUSH
3.0000 mL | Freq: Two times a day (BID) | INTRAVENOUS | Status: DC
  Administered 2022-09-24 – 2022-09-25 (×3): 3 mL via INTRAVENOUS

## 2022-09-23 MED ORDER — BISACODYL 10 MG RE SUPP: 10.0000 mg | Freq: Every day | RECTAL | Status: DC | PRN

## 2022-09-23 MED ORDER — FLUCONAZOLE IN SODIUM CHLORIDE 200-0.9 MG/100ML-% IV SOLN
200.0000 mg | INTRAVENOUS | Status: DC
  Administered 2022-09-24 – 2022-09-25 (×2): 200 mg via INTRAVENOUS
  Filled 2022-09-23 (×2): qty 100

## 2022-09-23 MED ORDER — ALBUTEROL SULFATE (2.5 MG/3ML) 0.083% IN NEBU: 2.5000 mg | INHALATION_SOLUTION | RESPIRATORY_TRACT | Status: DC | PRN

## 2022-09-23 MED ORDER — HYDROMORPHONE HCL 1 MG/ML IJ SOLN: 0.5000 mg | INTRAMUSCULAR | Status: DC | PRN

## 2022-09-23 NOTE — Telephone Encounter (Signed)
I have spoken to patient who states that she took 2 omeprazole capsules this morning and feels that they are stuck in her esophagus. She states that she has done everything possible to get them down and nothing has worked. States she feels midsternal chest pain. Patient sounds extremely hoarse. She says that she is not having too much difficulty breathing at this time but says she did have to use her inhaler today. Patient with recently dx esophageal dysmotility, aspiration. I have advised the patient that unfortunately, if she feels she has capsule stuck in the esophagus, she will need to go to the emergency room for evaluation and possible intervention if needed. Patient verbalizes understanding.

## 2022-09-23 NOTE — ED Provider Notes (Signed)
[place] EMERGENCY DEPARTMENT AT [facility] Provider Note   CSN: [id number] Arrival date & time: 09/23/22  1647     History  Chief Complaint  Patient presents with   Dysphagia    Alyssa [name] is a 89 y.o. female.  HPI     89 year old female with a history of atrial fibrillation, breast cancer, hypertension, hypothyroidism, thyroid cancer s/p thyroidectomy, GERD, esophageal narrowing, CKD stage IV who presents with concern for sensation that pills stuck in her throat for much of today and now continued sore throat, hoarse voice, and development of chest pain.   She has been seeing gastroenterology for dysphagia.  She has had it for years however it has been worsening over the last few years.  Reported at her gastroenterology visit that anytime she swallows liquids or pills she feels like they get stuck in her oropharyngeal area resulting in her having to cough or choke.  She also struggles with solids.  She swallows pills and sometimes feels like it stuck in her throat and stay there.  Asked gastroenterology was going to trial a modified barium swallow, consider upper GI series noting that EGD would be high risk for her at her age however also noting that she is in great health for her age  In the past she has been able to pass pills but this time feels like she was unable to pass them. Was able to drink fluids, had tea.  Daughter was able to see her and had upper GI on Monday.    Today felt as though the pills were stuck for an extended period of time today, she was trying to cough it up and felt like it was still stuck in throat. Now has sore throat and irritation.  Feels that pills have gone down into esophagus but has significant painful swallowing.  She has not been able to eat much due to her difficulties swallowing and today has mostly just had liquid. Has baseline dyspnea from asthma that is not changed.  Did develop left sided chest pain after swallowing incident  today.  No nausea or vomiting, no fevers.     Past Medical History:  Diagnosis Date   Anxiety    Arthritis    Atrial fibrillation (HCC)    pacemaker, chronic anticoag   Breast cancer (HCC)    Diverticulosis    Fibroma    inner lips/mouth   GERD (gastroesophageal reflux disease)    Hx of breast cancer    Hyperkalemia    Hypertension    Hypothyroidism    IBS (irritable bowel syndrome)    Internal hemorrhoids    Left inguinal hernia    direct   MVP (mitral valve prolapse)    Osteoporosis    Renal insufficiency    Status post dilation of esophageal narrowing    Thyroid cancer (HCC)      Home Medications Prior to Admission medications   Medication Sig Start Date End Date Taking? Authorizing Provider  ELIQUIS 2.5 MG TABS tablet TAKE 1 TABLET BY MOUTH TWICE A DAY 05/31/22  Yes Duke Salvia, MD  levothyroxine (SYNTHROID) 50 MCG tablet Take 1 tablet (50 mcg total) by mouth daily before breakfast. 07/08/22  Yes [name], MD  omeprazole (PRILOSEC) 40 MG capsule Take 1 capsule (40 mg total) by mouth daily. 09/20/22  Yes McMichael, Bayley M, PA-C  acetaminophen (TYLENOL) 650 MG CR tablet Take 1,300 mg by mouth 2 (two) times daily.    [provider]  albuterol (VENTOLIN HFA) 108 (90 Base) MCG/ACT inhaler Inhale 2 puffs into the lungs every 6 (six) hours as needed. 08/03/22   Charlott Holler, MD  diazepam (VALIUM) 2 MG tablet Take 1 tablet (2 mg total) by mouth every 8 (eight) hours as needed for anxiety. 07/09/22   [name], MD  famotidine (PEPCID) 40 MG tablet Take 1 tablet (40 mg total) by mouth at bedtime. 09/14/22   McMichael, Saddie Benders, PA-C  furosemide (LASIX) 40 MG tablet TAKE 1 TABLET BY MOUTH DAILY; MAY TAKE ADDITIONAL HALF TABLET AS NEEDED FOR WEIGHT GAIN 09/09/22   [name], MD  Homeopathic Products ([employer] EAR PAIN RELIEF OT) Place 3 drops into the left ear as needed (Pain).    [provider]  MISC NATURAL PRODUCTS EX Apply 1 application topically  in the morning and at bedtime. CBD cream for back and ankle pain    [provider]  mometasone-formoterol (DULERA) 100-5 MCG/ACT AERO Inhale 2 puffs into the lungs 2 (two) times daily.    [provider]  Propylene Glycol (SYSTANE COMPLETE OP) Apply 1 drop to eye as needed (dryness).    [provider]  sodium chloride (OCEAN) 0.65 % SOLN nasal spray Place 1 spray into both nostrils as needed for congestion.    [provider]  Wheat Dextrin (BENEFIBER PO) Take 1 Dose by mouth daily.    [provider]      Allergies    Penicillins    Review of Systems   Review of Systems  Physical Exam Updated Vital Signs BP 125/84 (BP Location: Right Arm)   Pulse 65   Temp 98.2 F (36.8 C) (Oral)   Resp 20   Ht 5\' 6"  (1.676 m)   Wt 49.9 kg   SpO2 93%   BMI 17.74 kg/m  Physical Exam Vitals and nursing note reviewed.  Constitutional:      General: She is not in acute distress.    Appearance: She is well-developed. She is not diaphoretic.  HENT:     Head: Normocephalic and atraumatic.     Comments: White patches on tongue Eyes:     Conjunctiva/sclera: Conjunctivae normal.  Cardiovascular:     Rate and Rhythm: Normal rate and regular rhythm.     Heart sounds: Normal heart sounds. No murmur heard.    No friction rub. No gallop.  Pulmonary:     Effort: Pulmonary effort is normal. No respiratory distress.     Breath sounds: Normal breath sounds. No wheezing or rales.  Abdominal:     General: There is no distension.     Palpations: Abdomen is soft.     Tenderness: There is no abdominal tenderness. There is no guarding.  Musculoskeletal:        General: No tenderness.     Cervical back: Normal range of motion.  Skin:    General: Skin is warm and dry.     Findings: No erythema or rash.  Neurological:     Mental Status: She is alert and oriented to person, place, and time.     ED Results / Procedures / Treatments   Labs (all labs ordered  are listed, but only abnormal results are displayed) Labs Reviewed  CBC WITH DIFFERENTIAL/PLATELET - Abnormal; Notable for the following components:      Result Value   RBC 3.63 (*)    Hemoglobin 11.6 (*)    HCT 33.9 (*)    RDW 11.1 (*)  All other components within normal limits  COMPREHENSIVE METABOLIC PANEL - Abnormal; Notable for the following components:   Sodium 122 (*)    Potassium 2.7 (*)    Chloride 84 (*)    Creatinine, Ser 1.03 (*)    Calcium 8.0 (*)    Total Protein 5.8 (*)    AST 51 (*)    GFR, Estimated 50 (*)    All other components within normal limits  CBC - Abnormal; Notable for the following components:   RBC 3.71 (*)    Hemoglobin 11.8 (*)    HCT 35.5 (*)    RDW 11.2 (*)    All other components within normal limits  BASIC METABOLIC PANEL - Abnormal; Notable for the following components:   Sodium 126 (*)    Potassium 3.4 (*)    Chloride 90 (*)    Calcium 8.3 (*)    All other components within normal limits  URINALYSIS, ROUTINE W REFLEX MICROSCOPIC - Abnormal; Notable for the following components:   Color, Urine COLORLESS (*)    Specific Gravity, Urine 1.002 (*)    Leukocytes,Ua TRACE (*)    All other components within normal limits  OSMOLALITY, URINE - Abnormal; Notable for the following components:   Osmolality, Ur 101 (*)    All other components within normal limits  TROPONIN I (HIGH SENSITIVITY) - Abnormal; Notable for the following components:   Troponin I (High Sensitivity) 24 (*)    All other components within normal limits  TROPONIN I (HIGH SENSITIVITY) - Abnormal; Notable for the following components:   Troponin I (High Sensitivity) 26 (*)    All other components within normal limits  LIPASE, BLOOD  MAGNESIUM  SODIUM, URINE, RANDOM  OSMOLALITY    EKG EKG Interpretation Date/Time:  Thursday September 23 2022 18:22:50 EDT Ventricular Rate:  63 PR Interval:    QRS Duration:  174 QT Interval:  498 QTC Calculation: 509 R  Axis:   -86  Text Interpretation: Ventricular-paced rhythm Abnormal ECG When compared with ECG of 28-Mar-2021 17:09, No significant change since last tracing Confirmed by Alvira Monday ([id number]) on 09/23/2022 7:09:37 PM  Radiology DG Chest 2 View  Result Date: 09/23/2022 CLINICAL DATA:  Chest pain EXAM: CHEST - 2 VIEW COMPARISON:  03/28/2021, CT 05/04/2016 FINDINGS: Left-sided pacing device as before. Cardiomegaly. Small bilateral pleural effusions. Aortic atherosclerosis. Bilateral breast implants. Clips in the right axilla. No radiopaque foreign body over the chest. IMPRESSION: Cardiomegaly with small bilateral pleural effusions. Electronically Signed   By: Jasmine Pang M.D.   On: 09/23/2022 19:25   DG Neck Soft Tissue  Result Date: 09/23/2022 CLINICAL DATA:  Medication stuck in throat EXAM: NECK SOFT TISSUES - 1+ VIEW COMPARISON:  None Available. FINDINGS: There is no evidence of retropharyngeal soft tissue swelling or epiglottic enlargement. The cervical airway is unremarkable and no radio-opaque foreign body identified. IMPRESSION: Negative. Electronically Signed   By: Charline Bills M.D.   On: 09/23/2022 19:24    Procedures Procedures    Medications Ordered in ED Medications  heparin injection 5,000 Units (5,000 Units Subcutaneous Given 09/24/22 0656)  sodium chloride flush (NS) 0.9 % injection 3 mL (3 mLs Intravenous Not Given 09/24/22 0023)  0.9 %  sodium chloride infusion ( Intravenous Infusion Verify 09/24/22 0356)  acetaminophen (TYLENOL) suppository 650 mg (650 mg Rectal Given 09/24/22 0032)  HYDROmorphone (DILAUDID) injection 0.5 mg (has no administration in time range)  bisacodyl (DULCOLAX) suppository 10 mg (has no administration in time range)  ondansetron (ZOFRAN)  injection 4 mg (has no administration in time range)  albuterol (PROVENTIL) (2.5 MG/3ML) 0.083% nebulizer solution 2.5 mg (has no administration in time range)  LORazepam (ATIVAN) injection 0.5 mg (0.5 mg  Intravenous Given 09/24/22 0216)  fluconazole (DIFLUCAN) IVPB 200 mg (has no administration in time range)  Oral care mouth rinse (has no administration in time range)  potassium chloride 10 mEq in 100 mL IVPB (0 mEq Intravenous Stopped 09/24/22 0215)  lactated ringers bolus 1,000 mL (0 mLs Intravenous Stopped 09/23/22 2323)  lidocaine (XYLOCAINE) 2 % viscous mouth solution 15 mL (15 mLs Mouth/Throat Given 09/23/22 2214)  pantoprazole (PROTONIX) 80 mg /NS 100 mL IVPB (0 mg Intravenous Stopped 09/23/22 2323)  fluconazole (DIFLUCAN) IVPB 200 mg (0 mg Intravenous Stopped 09/23/22 2330)    ED Course/ Medical Decision Making/ A&P                                  89 year old female with a history of atrial fibrillation, breast cancer, hypertension, hypothyroidism, thyroid cancer s/p thyroidectomy, GERD, esophageal narrowing, CKD stage IV who presents with concern for sensation that pills stuck in her throat for much of today and now continued sore throat, hoarse voice, and development of chest pain.   DDx includes zenker's diverticulum, esophageal stricture, mass, neurologic etiology of difficulty swallowing, esophagitis.   Reviewed modified barium swallow and upper GI with some esophageal dysmotility and no clear anatomic abnormalities.  No acute changes in swallowing or other new neurologic symptoms to suggest acute CVA today.  Does have dysphonia, but no drooling, stridor, and is able to tolerate po fluids.  Do not suspect obstruction.  Overall suspect laryngeal irritation from pills resulting in pain and dysphonia and also pill esophagitis. She does have appearance of thrush and consider candidal esophagitis and gave PPI as well as fluconazole.    Regarding chest pain--ECG without acute changes. Troponin mildly elevated but stable on reheck and lower suspicion for ACS.. Low suspicion clinically for dissection, PE, doubt esophageal perforation.  Pain may be related to other esophageal  pathology.  Discussed with Dr. Suszanne Conners ENT and Dr. Marina Goodell GI who will evaluate in AM.    Labs completed and personally evaluated by me show Na 122, K 2.7.  CXR with effusions which have been present before, soft tissue neck without acute findings.  Suspect dehydration in setting of poor po intake.  Will admit for continued care.         Final Clinical Impression(s) / ED Diagnoses Final diagnoses:  Dysphagia, unspecified type  Odynophagia  Hyponatremia  Hypokalemia    Rx / DC Orders ED Discharge Orders     None         Alvira Monday, MD 09/24/22 [phone]

## 2022-09-23 NOTE — ED Triage Notes (Signed)
Patient BIB EMS. Patient reports taking her medications today and feels that it is stuck in her throat. Patient reports she has pain radiating down her throat to her chest and RUQ. Patient has hx of dysphagia and recently had a swallow study.

## 2022-09-23 NOTE — ED Notes (Signed)
ED TO INPATIENT HANDOFF REPORT  Name/Age/Gender Alyssa [name] 89 y.o. female  Code Status    Code Status Orders  (From admission, onward)           Start     Ordered   09/23/22 2243  Do not attempt resuscitation (DNR)  Continuous       Question Answer Comment  If patient has no pulse and is not breathing Do Not Attempt Resuscitation   If patient has a pulse and/or is breathing: Medical Treatment Goals LIMITED ADDITIONAL INTERVENTIONS: Use medication/IV fluids and cardiac monitoring as indicated; Do not use intubation or mechanical ventilation (DNI), also provide comfort medications.  Transfer to Progressive/Stepdown as indicated, avoid Intensive Care.   Consent: Discussion documented in EHR or advanced directives reviewed      09/23/22 2244           Code Status History     Date Active Date Inactive Code Status Order ID Comments User Context   03/13/2020 1837 03/14/2020 2005 Full Code [id number]  Bobette Mo, MD ED   02/20/2018 1829 02/22/2018 1558 Full Code [id number]  Rodolph Bong, MD ED   02/26/2015 1611 02/26/2015 2117 Full Code [id number]  Duke Salvia, MD Inpatient   02/26/2015 1611 02/26/2015 1611 Full Code [id number]  Duke Salvia, MD Inpatient       Home/SNF/Other Home  Chief Complaint Dysphagia [R13.10]  Level of Care/Admitting Diagnosis ED Disposition     ED Disposition  Admit   Condition  --   Comment  Hospital Area: [facility] [[id number]]  Level of Care: Telemetry [5]  Admit to tele based on following criteria: Monitor QTC interval  May admit patient to [facility] or [facility] if equivalent level of care is available:: No  Covid Evaluation: Asymptomatic - no recent exposure (last 10 days) testing not required  Diagnosis: Dysphagia [347425]  Admitting Physician: Charlsie Quest [[id number]]  Attending Physician: Charlsie Quest [[id number]]  Certification:: I certify this patient will need inpatient services for at  least 2 midnights  Expected Medical Readiness: 09/25/2022          Medical History Past Medical History:  Diagnosis Date   Anxiety    Arthritis    Atrial fibrillation (HCC)    pacemaker, chronic anticoag   Breast cancer (HCC)    Diverticulosis    Fibroma    inner lips/mouth   GERD (gastroesophageal reflux disease)    Hx of breast cancer    Hyperkalemia    Hypertension    Hypothyroidism    IBS (irritable bowel syndrome)    Internal hemorrhoids    Left inguinal hernia    direct   MVP (mitral valve prolapse)    Osteoporosis    Renal insufficiency    Status post dilation of esophageal narrowing    Thyroid cancer (HCC)     Allergies Allergies  Allergen Reactions   Penicillins Diarrhea    Has IBS (caused diarrhea)    IV Location/Drains/Wounds Patient Lines/Drains/Airways Status     Active Line/Drains/Airways     Name Placement date Placement time Site Days   Peripheral IV 09/23/22 Anterior;Left;Proximal;Lateral Forearm 09/23/22  2213  Forearm  less than 1            Labs/Imaging Results for orders placed or performed during the hospital encounter of 09/23/22 (from the past 48 hour(s))  CBC with Differential     Status: Abnormal   Collection Time: 09/23/22  6:15 PM  Result Value Ref Range   WBC 5.3 4.0 - 10.5 K/uL   RBC 3.63 (L) 3.87 - 5.11 MIL/uL   Hemoglobin 11.6 (L) 12.0 - 15.0 g/dL   HCT 16.1 (L) 09.6 - 04.5 %   MCV 93.4 80.0 - 100.0 fL   MCH 32.0 26.0 - 34.0 pg   MCHC 34.2 30.0 - 36.0 g/dL   RDW 40.9 (L) 81.1 - 91.4 %   Platelets 171 150 - 400 K/uL   nRBC 0.0 0.0 - 0.2 %   Neutrophils Relative % 70 %   Neutro Abs 3.6 1.7 - 7.7 K/uL   Lymphocytes Relative 23 %   Lymphs Abs 1.2 0.7 - 4.0 K/uL   Monocytes Relative 7 %   Monocytes Absolute 0.4 0.1 - 1.0 K/uL   Eosinophils Relative 0 %   Eosinophils Absolute 0.0 0.0 - 0.5 K/uL   Basophils Relative 0 %   Basophils Absolute 0.0 0.0 - 0.1 K/uL   Immature Granulocytes 0 %   Abs Immature  Granulocytes 0.01 0.00 - 0.07 K/uL    Comment: Performed at [facility], [address], [place], [place] [address]  Comprehensive metabolic panel     Status: Abnormal   Collection Time: 09/23/22  6:15 PM  Result Value Ref Range   Sodium 122 (L) 135 - 145 mmol/L   Potassium 2.7 (LL) 3.5 - 5.1 mmol/L    Comment: CRITICAL RESULT CALLED TO, READ BACK BY AND VERIFIED WITH K.GIBSON, RN AT 2008 ON 08.15.24 BY N.THOMPSON    Chloride 84 (L) 98 - 111 mmol/L   CO2 25 22 - 32 mmol/L   Glucose, Bld 93 70 - 99 mg/dL    Comment: Glucose reference range applies only to samples taken after fasting for at least 8 hours.   BUN 18 8 - 23 mg/dL   Creatinine, Ser 6.21 (H) 0.44 - 1.00 mg/dL   Calcium 8.0 (L) 8.9 - 10.3 mg/dL   Total Protein 5.8 (L) 6.5 - 8.1 g/dL   Albumin 3.9 3.5 - 5.0 g/dL   AST 51 (H) 15 - 41 U/L   ALT 32 0 - 44 U/L   Alkaline Phosphatase 53 38 - 126 U/L   Total Bilirubin 0.9 0.3 - 1.2 mg/dL   GFR, Estimated 50 (L) >60 mL/min    Comment: (NOTE) Calculated using the CKD-EPI Creatinine Equation (2021)    Anion gap 13 5 - 15    Comment: Performed at [facility], [address], [place], [place] [address]  Lipase, blood     Status: None   Collection Time: 09/23/22  6:15 PM  Result Value Ref Range   Lipase 46 11 - 51 U/L    Comment: Performed at [facility], [address], [place], [place] [address]  Troponin I (High Sensitivity)     Status: Abnormal   Collection Time: 09/23/22  6:15 PM  Result Value Ref Range   Troponin I (High Sensitivity) 24 (H) <18 ng/L    Comment: (NOTE) Elevated high sensitivity troponin I (hsTnI) values and significant  changes across serial measurements may suggest ACS but many other  chronic and acute conditions are known to elevate hsTnI results.  Refer to the "Links" section for chest pain algorithms and additional  guidance. Performed at [facility], [address] [place], [place] [address]   Troponin I (High Sensitivity)     Status: Abnormal   Collection Time: 09/23/22  8:10 PM  Result  Value Ref Range   Troponin I (High Sensitivity) 26 (H) <18 ng/L    Comment: (NOTE) Elevated high sensitivity troponin I (hsTnI) values and significant  changes across serial measurements may suggest ACS but many other  chronic and acute conditions are known to elevate hsTnI results.  Refer to the "Links" section for chest pain algorithms and additional  guidance. Performed at [facility], [address], [place], [place] [address]   Magnesium     Status: None   Collection Time: 09/23/22  8:10 PM  Result Value Ref Range   Magnesium 1.9 1.7 - 2.4 mg/dL    Comment: Performed at [facility], [address] [place], [place] [address]   *Note: Due to a large number of results and/or encounters for the requested time period, some results have not been displayed. A complete set of results can be found in Results Review.   DG Chest 2 View  Result Date: 09/23/2022 CLINICAL DATA:  Chest pain EXAM: CHEST - 2 VIEW COMPARISON:  03/28/2021, CT 05/04/2016 FINDINGS: Left-sided pacing device as before. Cardiomegaly. Small bilateral pleural effusions. Aortic atherosclerosis. Bilateral breast implants. Clips in the right axilla. No radiopaque foreign body over the chest. IMPRESSION: Cardiomegaly with small bilateral pleural effusions. Electronically Signed   By: Jasmine Pang M.D.   On: 09/23/2022 19:25   DG Neck Soft Tissue  Result Date: 09/23/2022 CLINICAL DATA:  Medication stuck in throat EXAM: NECK SOFT TISSUES - 1+ VIEW COMPARISON:  None Available. FINDINGS: There is no evidence of retropharyngeal soft tissue swelling or epiglottic enlargement. The cervical airway is unremarkable and no radio-opaque foreign body identified. IMPRESSION: Negative. Electronically Signed   By: Charline Bills M.D.   On: 09/23/2022 19:24    Pending  Labs Unresulted Labs (From admission, onward)     Start     Ordered   09/24/22 0500  CBC  Tomorrow morning,   R        09/23/22 2244   09/24/22 0500  Basic metabolic panel  Tomorrow morning,   R        09/23/22 2244   09/23/22 2301  Urinalysis, Routine w reflex microscopic -Urine, Clean Catch  Once,   R       Question:  Specimen Source  Answer:  Urine, Clean Catch   09/23/22 2300   09/23/22 2301  Sodium, urine, random  Once,   R        09/23/22 2300   09/23/22 2301  Osmolality, urine  Once,   R        09/23/22 2300   09/23/22 2301  Osmolality  Add-on,   AD        09/23/22 2300            Vitals/Pain Today's Vitals   09/23/22 2100 09/23/22 2145 09/23/22 2200 09/23/22 2244  BP: (!) 148/98 (!) 143/72 139/78 (!) 166/78  Pulse: 62 (!) 59 61 (!) 59  Resp: (!) 22 17 (!) 21 14  Temp:    97.7 F (36.5 C)  TempSrc:    Oral  SpO2: 97% 99% 99% 98%  Weight:      Height:      PainSc:        Isolation Precautions No active isolations  Medications Medications  potassium chloride 10 mEq in 100 mL IVPB (10 mEq Intravenous New Bag/Given 09/23/22 2246)  fluconazole (DIFLUCAN) IVPB 200 mg (200 mg Intravenous New Bag/Given 09/23/22 2247)  heparin injection 5,000 Units (has no  administration in time range)  sodium chloride flush (NS) 0.9 % injection 3 mL (has no administration in time range)  0.9 %  sodium chloride infusion (has no administration in time range)  acetaminophen (TYLENOL) suppository 650 mg (has no administration in time range)  HYDROmorphone (DILAUDID) injection 0.5 mg (has no administration in time range)  bisacodyl (DULCOLAX) suppository 10 mg (has no administration in time range)  ondansetron (ZOFRAN) injection 4 mg (has no administration in time range)  albuterol (PROVENTIL) (2.5 MG/3ML) 0.083% nebulizer solution 2.5 mg (has no administration in time range)  LORazepam (ATIVAN) injection 0.5 mg (has no administration in time range)  fluconazole (DIFLUCAN) IVPB 200 mg  (has no administration in time range)  lactated ringers bolus 1,000 mL (1,000 mLs Intravenous New Bag/Given 09/23/22 2152)  lidocaine (XYLOCAINE) 2 % viscous mouth solution 15 mL (15 mLs Mouth/Throat Given 09/23/22 2214)  pantoprazole (PROTONIX) 80 mg /NS 100 mL IVPB (80 mg Intravenous New Bag/Given 09/23/22 2213)    Mobility walks

## 2022-09-23 NOTE — H&P (Signed)
History and Physical    Alyssa [name] WUJ:[id number] DOB: 21-Jan-1925 DOA: 09/23/2022  PCP: [name], MD  Patient coming from: Home  I have personally briefly reviewed patient's old medical records in [facility]  Chief Complaint: Dysphagia  HPI: Alyssa [name] is a 89 y.o. female with medical history significant for permanent A-fib on Eliquis, CHB s/p PPM, chronic HFpEF, severe TR, CKD stage IIIb, asthma, acquired hypothyroidism (s/p thyroidectomy for thyroid cancer), anxiety who presented to the ED for evaluation of dysphagia.  Patient states she has been having issues with swallowing difficulty over the last month.  She has not been able to tolerate solid foods and has been mostly on a liquid diet.    She has been following with [facility].  She underwent modified barium swallow study 7/26 which was largely unrevealing.  She then underwent UGI series 8/12 which was notable for tracheal aspiration with rapid swallowing, nonspecific esophageal dysmotility disorder, small type I hiatal hernia.  Patient states that she has been having continued difficulty swallowing including liquids and pills.  Today she took 2 omeprazole pills and developed sensation that they were stuck in her lower throat area.  She feels like she sometimes chokes when drinking liquids.  She has not been able to tolerate solids at all.  She was previously taking oral potassium pills however this was discontinued 2 weeks ago after she was found to have elevated potassium with outpatient labs.  ED Course  Labs/Imaging on admission: I have personally reviewed following labs and imaging studies.  Initial vitals showed BP 135/79, pulse 60, RR 14, temp 98.2 F, SpO2 100% on room air.  Labs show sodium 122, potassium 2.7, magnesium 1.9, chloride 84, bicarb 25, BUN 18, creatinine 1.03, serum glucose 93, lipase 46, WBC 5.3, hemoglobin 11.6, platelets 171,000, troponin 24 > 26.  2 view chest x-ray showed  cardiomegaly with small bilateral pleural effusions.  Left-sided pacing device seen in place.  Soft tissue neck x-ray was negative for radiopaque foreign body.  Cervical airway unremarkable.  No evidence of retropharyngeal soft tissue swelling or epiglottic enlargement.  Patient was given 1 L LR, IV K 10 mEq x 5, IV Protonix 80 mg, IV fluconazole 200 mg.  EDP spoke with ENT Dr. Suszanne Conners and [facility] Dr. Marina Goodell who will see in consultation.  The hospitalist service was consulted to admit for further evaluation and management.  Review of Systems: All systems reviewed and are negative except as documented in history of present illness above.   Past Medical History:  Diagnosis Date   Anxiety    Arthritis    Atrial fibrillation (HCC)    pacemaker, chronic anticoag   Breast cancer (HCC)    Diverticulosis    Fibroma    inner lips/mouth   GERD (gastroesophageal reflux disease)    Hx of breast cancer    Hyperkalemia    Hypertension    Hypothyroidism    IBS (irritable bowel syndrome)    Internal hemorrhoids    Left inguinal hernia    direct   MVP (mitral valve prolapse)    Osteoporosis    Renal insufficiency    Status post dilation of esophageal narrowing    Thyroid cancer [facility])     Past Surgical History:  Procedure Laterality Date   COLONOSCOPY  10/28/2005   internal hemorrhoids, diverticulosis (same as in 2002 and random bxs negative then)   EP IMPLANTABLE DEVICE N/A 02/26/2015   Procedure: PPM Generator Changeout;  Surgeon:  Duke Salvia, MD;  Location: [facility] INVASIVE CV LAB;  Service: Cardiovascular;  Laterality: N/A;   MASTECTOMY  1982   Bilateral   PACEMAKER INSERTION     THYROIDECTOMY     TONSILLECTOMY     UPPER GASTROINTESTINAL ENDOSCOPY  09/06/2000   normal    Social History:  reports that she quit smoking about 68 years ago. Her smoking use included cigarettes. She has never used smokeless tobacco. She reports that she does not drink alcohol and does not use  drugs.  Allergies  Allergen Reactions   Penicillins Diarrhea    Has IBS (caused diarrhea)    Family History  Problem Relation Age of Onset   Stroke Mother    Colon cancer Father 32   Heart disease Father    Arthritis Other    Hypertension Other    Lung cancer Son    Esophageal cancer Neg Hx    Pancreatic cancer Neg Hx    Kidney disease Neg Hx    Liver disease Neg Hx    Diabetes Neg Hx    Rectal cancer Neg Hx    Stomach cancer Neg Hx      Prior to Admission medications   Medication Sig Start Date End Date Taking? Authorizing Provider  acetaminophen (TYLENOL) 650 MG CR tablet Take 1,300 mg by mouth 2 (two) times daily.    [provider]  albuterol (VENTOLIN HFA) 108 (90 Base) MCG/ACT inhaler Inhale 2 puffs into the lungs every 6 (six) hours as needed. 08/03/22   Charlott Holler, MD  diazepam (VALIUM) 2 MG tablet Take 1 tablet (2 mg total) by mouth every 8 (eight) hours as needed for anxiety. 07/09/22   [name], MD  ELIQUIS 2.5 MG TABS tablet TAKE 1 TABLET BY MOUTH TWICE A DAY 05/31/22   Duke Salvia, MD  famotidine (PEPCID) 40 MG tablet Take 1 tablet (40 mg total) by mouth at bedtime. 09/14/22   McMichael, Saddie Benders, PA-C  furosemide (LASIX) 40 MG tablet TAKE 1 TABLET BY MOUTH DAILY; MAY TAKE ADDITIONAL HALF TABLET AS NEEDED FOR WEIGHT GAIN 09/09/22   [name], MD  Homeopathic Products ([employer] EAR PAIN RELIEF OT) Place 3 drops into the left ear as needed (Pain).    [provider]  levothyroxine (SYNTHROID) 50 MCG tablet Take 1 tablet (50 mcg total) by mouth daily before breakfast. 07/08/22   [name], MD  MISC NATURAL PRODUCTS EX Apply 1 application topically in the morning and at bedtime. CBD cream for back and ankle pain    [provider]  mometasone-formoterol (DULERA) 100-5 MCG/ACT AERO Inhale 2 puffs into the lungs 2 (two) times daily.    [provider]  omeprazole (PRILOSEC) 40 MG capsule Take 1 capsule (40 mg total) by  mouth daily. 09/20/22   McMichael, Saddie Benders, PA-C  Propylene Glycol (SYSTANE COMPLETE OP) Apply 1 drop to eye as needed (dryness).    [provider]  sodium chloride (OCEAN) 0.65 % SOLN nasal spray Place 1 spray into both nostrils as needed for congestion.    [provider]  Wheat Dextrin (BENEFIBER PO) Take 1 Dose by mouth daily.    [provider]    Physical Exam: Vitals:   09/23/22 2100 09/23/22 2145 09/23/22 2200 09/23/22 2244  BP: (!) 148/98 (!) 143/72 139/78 (!) 166/78  Pulse: 62 (!) 59 61 (!) 59  Resp: (!) 22 17 (!) 21 14  Temp:    97.7 F (  36.5 C)  TempSrc:    Oral  SpO2: 97% 99% 99% 98%  Weight:      Height:       Constitutional: Thin elderly woman resting in bed with head elevated.  Eyes: EOMI, lids and conjunctivae normal ENMT: Mucous membranes are dry.  White plaques on tongue.  Voice is hoarse.  Currently protecting airway. Neck: normal, supple, no masses. Respiratory: clear to auscultation bilaterally, no wheezing, no crackles. Normal respiratory effort. No accessory muscle use.  Cardiovascular: Regular rate and rhythm, no murmurs / rubs / gallops. No extremity edema.  PPM in place left chest wall. Abdomen: no tenderness, no masses palpated. Musculoskeletal: no clubbing / cyanosis. No joint deformity upper and lower extremities. Good ROM, no contractures. Normal muscle tone.  Skin: no rashes, lesions, ulcers. No induration Neurologic: Sensation intact. Strength 5/5 in all 4.  Psychiatric: Normal judgment and insight. Alert and oriented x 3. Normal mood.   EKG: Personally reviewed. V paced rhythm, rate 63.  Rate is slower when compared to previous.  Assessment/Plan Principal Problem:   Dysphagia Active Problems:   Hyponatremia   Hypokalemia   Hypothyroidism   Pacemaker-dual chamber    Chronic kidney disease, stage 3b (HCC)   Permanent atrial fibrillation (HCC)   GAD (generalized anxiety disorder)   (HFpEF) heart failure with  preserved ejection fraction (HCC)   Mild asthma   Alyssa [name] is a 89 y.o. female with medical history significant for permanent A-fib on Eliquis, CHB s/p PPM, chronic HFpEF, severe TR, CKD stage IIIb, asthma, acquired hypothyroidism (s/p thyroidectomy for thyroid cancer), anxiety who is admitted with hyponatremia, hypokalemia, and dysphagia.  Assessment and Plan: Dysphagia: Persistent dysphagia with solids, liquids, and pills.  Presenting with pills stuck in throat sensation.  Soft tissue neck x-ray negative for radiopaque foreign body.  Voice is hoarse but she is currently protecting airway and controlling secretions.  White plaques noted on tongue. -Strict n.p.o. -ENT and [facility] to evaluate -Continue empiric IV fluconazole  Hyponatremia: Sodium 122 on admission.  Appears hypovolemic, related to decreased oral intake. -IV NS at 75 mL/hour -Hold Lasix -Obtain urine studies  Hypokalemia: Started on IV supplementation.  Holding Lasix.  CKD stage IIIb: Renal function actually improved from baseline.  Continue to monitor.  Permanent atrial fibrillation CHB s/p PPM: In V-paced rhythm on admission.  Eliquis on hold while NPO.  Resume as soon as able.  Asthma: Stable without active wheezing.  Hold Dulera given concern for candidiasis.  Continue albuterol nebulizers as needed.  Chronic HFpEF with severe TR: EF 55-60%.  Appears hypovolemic as above.  Holding Lasix and monitor while on IV fluid hydration.  Hypothyroidism: Synthroid on hold while NPO.  Resume when able or convert to IV if remains n.p.o. for prolonged period of time.  Anxiety: Holding home Valium.  Can use IV Ativan 0.5 mg q8h prn for anxiety.   DVT prophylaxis: heparin injection 5,000 Units Start: 09/24/22 0600 Code Status: DNR, confirmed with patient on admission Family Communication: Neighbor at bedside. Disposition Plan: From home, dispo pending clinical progress Consults called: EDP spoke with ENT (Dr.  Suszanne Conners) and [facility] (Dr. Marina Goodell) whose teams will consult Severity of Illness: The appropriate patient status for this patient is INPATIENT. Inpatient status is judged to be reasonable and necessary in order to provide the required intensity of service to ensure the patient's safety. The patient's presenting symptoms, physical exam findings, and initial radiographic and laboratory data in the context of their chronic comorbidities  is felt to place them at high risk for further clinical deterioration. Furthermore, it is not anticipated that the patient will be medically stable for discharge from the hospital within 2 midnights of admission.   * I certify that at the point of admission it is my clinical judgment that the patient will require inpatient hospital care spanning beyond 2 midnights from the point of admission due to high intensity of service, high risk for further deterioration and high frequency of surveillance required.Darreld Mclean MD [facility]  If 7PM-7AM, please contact night-coverage [email or web address]  09/23/2022, 11:04 PM

## 2022-09-23 NOTE — Telephone Encounter (Signed)
PT requesting call back to discuss medication. Capsules are getting stuck in her throat. Please advise.

## 2022-09-23 NOTE — Hospital Course (Signed)
Alyssa [name] is a 89 y.o. female with medical history significant for permanent A-fib on Eliquis, CHB s/p PPM, chronic HFpEF, severe TR, CKD stage IIIb, asthma, acquired hypothyroidism (s/p thyroidectomy for thyroid cancer), anxiety who is admitted with hyponatremia, hypokalemia, and dysphagia.

## 2022-09-23 NOTE — ED Notes (Signed)
ED TO INPATIENT HANDOFF REPORT  ED Nurse Name and Phone #: [phone]-/gabby  S Name/Age/Gender Alyssa [name] 89 y.o. female Room/Bed: WA18/WA18  Code Status   Code Status: Prior  Home/SNF/Other Home Patient oriented to: self, place, time, and situation Is this baseline? Yes   Triage Complete: Triage complete  Chief Complaint Dysphagia [R13.10]  Triage Note Patient BIB EMS. Patient reports taking her medications today and feels that it is stuck in her throat. Patient reports she has pain radiating down her throat to her chest and RUQ. Patient has hx of dysphagia and recently had a swallow study.    Allergies Allergies  Allergen Reactions   Penicillins Diarrhea    Has IBS (caused diarrhea)    Level of Care/Admitting Diagnosis ED Disposition     ED Disposition  Admit   Condition  --   Comment  Hospital Area: [facility] [[id number]]  Level of Care: Telemetry [5]  Admit to tele based on following criteria: Monitor QTC interval  May admit patient to [facility] or [facility] if equivalent level of care is available:: No  Covid Evaluation: Asymptomatic - no recent exposure (last 10 days) testing not required  Diagnosis: Dysphagia [098119]  Admitting Physician: Charlsie Quest [[id number]]  Attending Physician: Charlsie Quest [[id number]]  Certification:: I certify this patient will need inpatient services for at least 2 midnights  Expected Medical Readiness: 09/25/2022          B Medical/Surgery History Past Medical History:  Diagnosis Date   Anxiety    Arthritis    Atrial fibrillation (HCC)    pacemaker, chronic anticoag   Breast cancer (HCC)    Diverticulosis    Fibroma    inner lips/mouth   GERD (gastroesophageal reflux disease)    Hx of breast cancer    Hyperkalemia    Hypertension    Hypothyroidism    IBS (irritable bowel syndrome)    Internal hemorrhoids    Left inguinal hernia    direct   MVP (mitral valve prolapse)     Osteoporosis    Renal insufficiency    Status post dilation of esophageal narrowing    Thyroid cancer [facility])    Past Surgical History:  Procedure Laterality Date   COLONOSCOPY  10/28/2005   internal hemorrhoids, diverticulosis (same as in 2002 and random bxs negative then)   EP IMPLANTABLE DEVICE N/A 02/26/2015   Procedure: PPM Generator Changeout;  Surgeon: Duke Salvia, MD;  Location: [facility] INVASIVE CV LAB;  Service: Cardiovascular;  Laterality: N/A;   MASTECTOMY  1982   Bilateral   PACEMAKER INSERTION     THYROIDECTOMY     TONSILLECTOMY     UPPER GASTROINTESTINAL ENDOSCOPY  09/06/2000   normal     A IV Location/Drains/Wounds Patient Lines/Drains/Airways Status     Active Line/Drains/Airways     Name Placement date Placement time Site Days   Peripheral IV 11/04/21 22 G 1" Right Antecubital 11/04/21  1622  Antecubital  323   Peripheral IV 09/23/22 Anterior;Left;Proximal;Lateral Forearm 09/23/22  2213  Forearm  less than 1            Intake/Output Last 24 hours No intake or output data in the 24 hours ending 09/23/22 2243  Labs/Imaging Results for orders placed or performed during the hospital encounter of 09/23/22 (from the past 48 hour(s))  CBC with Differential     Status: Abnormal   Collection Time: 09/23/22  6:15 PM  Result Value Ref Range  WBC 5.3 4.0 - 10.5 K/uL   RBC 3.63 (L) 3.87 - 5.11 MIL/uL   Hemoglobin 11.6 (L) 12.0 - 15.0 g/dL   HCT 09.6 (L) 04.5 - 40.9 %   MCV 93.4 80.0 - 100.0 fL   MCH 32.0 26.0 - 34.0 pg   MCHC 34.2 30.0 - 36.0 g/dL   RDW 81.1 (L) 91.4 - 78.2 %   Platelets 171 150 - 400 K/uL   nRBC 0.0 0.0 - 0.2 %   Neutrophils Relative % 70 %   Neutro Abs 3.6 1.7 - 7.7 K/uL   Lymphocytes Relative 23 %   Lymphs Abs 1.2 0.7 - 4.0 K/uL   Monocytes Relative 7 %   Monocytes Absolute 0.4 0.1 - 1.0 K/uL   Eosinophils Relative 0 %   Eosinophils Absolute 0.0 0.0 - 0.5 K/uL   Basophils Relative 0 %   Basophils Absolute 0.0 0.0 - 0.1 K/uL   Immature  Granulocytes 0 %   Abs Immature Granulocytes 0.01 0.00 - 0.07 K/uL    Comment: Performed at [facility], [address], [place], [place] [address]  Comprehensive metabolic panel     Status: Abnormal   Collection Time: 09/23/22  6:15 PM  Result Value Ref Range   Sodium 122 (L) 135 - 145 mmol/L   Potassium 2.7 (LL) 3.5 - 5.1 mmol/L    Comment: CRITICAL RESULT CALLED TO, READ BACK BY AND VERIFIED WITH K., RN AT 2008 ON 08.15.24 BY N.THOMPSON    Chloride 84 (L) 98 - 111 mmol/L   CO2 25 22 - 32 mmol/L   Glucose, Bld 93 70 - 99 mg/dL    Comment: Glucose reference range applies only to samples taken after fasting for at least 8 hours.   BUN 18 8 - 23 mg/dL   Creatinine, Ser 3.08 (H) 0.44 - 1.00 mg/dL   Calcium 8.0 (L) 8.9 - 10.3 mg/dL   Total Protein 5.8 (L) 6.5 - 8.1 g/dL   Albumin 3.9 3.5 - 5.0 g/dL   AST 51 (H) 15 - 41 U/L   ALT 32 0 - 44 U/L   Alkaline Phosphatase 53 38 - 126 U/L   Total Bilirubin 0.9 0.3 - 1.2 mg/dL   GFR, Estimated 50 (L) >60 mL/min    Comment: (NOTE) Calculated using the CKD-EPI Creatinine Equation (2021)    Anion gap 13 5 - 15    Comment: Performed at [facility], [address], [place], [place] [address]  Lipase, blood     Status: None   Collection Time: 09/23/22  6:15 PM  Result Value Ref Range   Lipase 46 11 - 51 U/L    Comment: Performed at [facility], [address] [place], [place] [address]  Troponin I (High Sensitivity)     Status: Abnormal   Collection Time: 09/23/22  6:15 PM  Result Value Ref Range   Troponin I (High Sensitivity) 24 (H) <18 ng/L    Comment: (NOTE) Elevated high sensitivity troponin I (hsTnI) values and significant  changes across serial measurements may suggest ACS but many other  chronic and acute conditions are known to elevate hsTnI results.  Refer to the "Links" section for chest pain algorithms and additional  guidance. Performed at [facility], [address], [place], [place] [address]   Troponin I (High Sensitivity)     Status: Abnormal   Collection Time: 09/23/22  8:10 PM  Result Value Ref Range   Troponin  I (High Sensitivity) 26 (H) <18 ng/L    Comment: (NOTE) Elevated high sensitivity troponin I (hsTnI) values and significant  changes across serial measurements may suggest ACS but many other  chronic and acute conditions are known to elevate hsTnI results.  Refer to the "Links" section for chest pain algorithms and additional  guidance. Performed at [facility], [address], [place], [place] [address]   Magnesium     Status: None   Collection Time: 09/23/22  8:10 PM  Result Value Ref Range   Magnesium 1.9 1.7 - 2.4 mg/dL    Comment: Performed at [facility], [address], [place], [place] [address]   *Note: Due to a large number of results and/or encounters for the requested time period, some results have not been displayed. A complete set of results can be found in Results Review.   DG Chest 2 View  Result Date: 09/23/2022 CLINICAL DATA:  Chest pain EXAM: CHEST - 2 VIEW COMPARISON:  03/28/2021, CT 05/04/2016 FINDINGS: Left-sided pacing device as before. Cardiomegaly. Small bilateral pleural effusions. Aortic atherosclerosis. Bilateral breast implants. Clips in the right axilla. No radiopaque foreign body over the chest. IMPRESSION: Cardiomegaly with small bilateral pleural effusions. Electronically Signed   By: Jasmine Pang M.D.   On: 09/23/2022 19:25   DG Neck Soft Tissue  Result Date: 09/23/2022 CLINICAL DATA:  Medication stuck in throat EXAM: NECK SOFT TISSUES - 1+ VIEW COMPARISON:  None Available. FINDINGS: There is no evidence of retropharyngeal soft tissue swelling or epiglottic enlargement. The cervical airway is unremarkable and no radio-opaque foreign body identified. IMPRESSION: Negative. Electronically Signed   By: Charline Bills M.D.   On: 09/23/2022  19:24    Pending Labs Unresulted Labs (From admission, onward)    None       Vitals/Pain Today's Vitals   09/23/22 2030 09/23/22 2100 09/23/22 2145 09/23/22 2200  BP: (!) 147/70 (!) 148/98 (!) 143/72 139/78  Pulse: (!) 58 62 (!) 59 61  Resp: 17 (!) 22 17 (!) 21  Temp:      TempSrc:      SpO2: 99% 97% 99% 99%  Weight:      Height:      PainSc:        Isolation Precautions No active isolations  Medications Medications  potassium chloride 10 mEq in 100 mL IVPB (0 mEq Intravenous Stopped 09/23/22 2243)  fluconazole (DIFLUCAN) IVPB 200 mg (has no administration in time range)  lactated ringers bolus 1,000 mL (1,000 mLs Intravenous New Bag/Given 09/23/22 2152)  lidocaine (XYLOCAINE) 2 % viscous mouth solution 15 mL (15 mLs Mouth/Throat Given 09/23/22 2214)  pantoprazole (PROTONIX) 80 mg /NS 100 mL IVPB (80 mg Intravenous New Bag/Given 09/23/22 2213)    Mobility walks with device     Focused Assessments Cardiac Assessment Handoff:    Lab Results  Component Value Date   CKTOTAL 115 04/22/2015   CKMB 3.7 04/22/2015   TROPONINI <0.03 02/21/2018   Lab Results  Component Value Date   DDIMER 0.31 03/28/2021   Does the Patient currently have chest pain? No    R Recommendations: See Admitting Provider Note  Report given to:   Additional Notes: pt only c/o throat pain. Getting IVF/potassium and diflucan ? Candida eshophagitis

## 2022-09-24 DIAGNOSIS — R1319 Other dysphagia: Secondary | ICD-10-CM | POA: Diagnosis not present

## 2022-09-24 DIAGNOSIS — R7989 Other specified abnormal findings of blood chemistry: Secondary | ICD-10-CM

## 2022-09-24 DIAGNOSIS — E876 Hypokalemia: Secondary | ICD-10-CM | POA: Diagnosis not present

## 2022-09-24 DIAGNOSIS — K219 Gastro-esophageal reflux disease without esophagitis: Secondary | ICD-10-CM | POA: Diagnosis not present

## 2022-09-24 DIAGNOSIS — R1310 Dysphagia, unspecified: Secondary | ICD-10-CM | POA: Diagnosis not present

## 2022-09-24 DIAGNOSIS — E871 Hypo-osmolality and hyponatremia: Secondary | ICD-10-CM

## 2022-09-24 LAB — BASIC METABOLIC PANEL
Anion gap: 11 (ref 5–15)
BUN: 13 mg/dL (ref 8–23)
CO2: 25 mmol/L (ref 22–32)
Calcium: 8.3 mg/dL — ABNORMAL LOW (ref 8.9–10.3)
Chloride: 90 mmol/L — ABNORMAL LOW (ref 98–111)
Creatinine, Ser: 0.85 mg/dL (ref 0.44–1.00)
GFR, Estimated: >60 mL/min (ref >60)
Glucose, Bld: 95 mg/dL (ref 70–99)
Potassium: 3.4 mmol/L — ABNORMAL LOW (ref 3.5–5.1)
Sodium: 126 mmol/L — ABNORMAL LOW (ref 135–145)

## 2022-09-24 LAB — URINALYSIS, ROUTINE W REFLEX MICROSCOPIC
Bacteria, UA: NONE SEEN
Bilirubin Urine: NEGATIVE
Glucose, UA: NEGATIVE mg/dL
Hgb urine dipstick: NEGATIVE
Ketones, ur: NEGATIVE mg/dL
Nitrite: NEGATIVE
Protein, ur: NEGATIVE mg/dL
Specific Gravity, Urine: 1.002 — ABNORMAL LOW (ref 1.005–1.030)
pH: 8 (ref 5.0–8.0)

## 2022-09-24 LAB — CBC
HCT: 35.5 % — ABNORMAL LOW (ref 36.0–46.0)
Hemoglobin: 11.8 g/dL — ABNORMAL LOW (ref 12.0–15.0)
MCH: 31.8 pg (ref 26.0–34.0)
MCHC: 33.2 g/dL (ref 30.0–36.0)
MCV: 95.7 fL (ref 80.0–100.0)
Platelets: 163 10*3/uL (ref 150–400)
RBC: 3.71 MIL/uL — ABNORMAL LOW (ref 3.87–5.11)
RDW: 11.2 % — ABNORMAL LOW (ref 11.5–15.5)
WBC: 4.6 10*3/uL (ref 4.0–10.5)
nRBC: 0 % (ref 0.0–0.2)

## 2022-09-24 LAB — OSMOLALITY, URINE: Osmolality, Ur: 101 mosm/kg — ABNORMAL LOW (ref 300–900)

## 2022-09-24 LAB — OSMOLALITY: Osmolality: 273 mosm/kg — ABNORMAL LOW (ref 275–295)

## 2022-09-24 LAB — SODIUM, URINE, RANDOM: Sodium, Ur: 19 mmol/L

## 2022-09-24 LAB — MAGNESIUM: Magnesium: 1.9 mg/dL (ref 1.7–2.4)

## 2022-09-24 MED ORDER — LEVOTHYROXINE SODIUM 100 MCG/5ML IV SOLN: 25.0000 ug | Freq: Every day | INTRAVENOUS | Status: DC

## 2022-09-24 MED ORDER — ORAL CARE MOUTH RINSE: 15.0000 mL | OROMUCOSAL | Status: DC | PRN

## 2022-09-24 MED ORDER — POTASSIUM CHLORIDE 10 MEQ/100ML IV SOLN
10.0000 meq | INTRAVENOUS | Status: AC
  Administered 2022-09-24 (×4): 10 meq via INTRAVENOUS
  Filled 2022-09-24 (×4): qty 100

## 2022-09-24 MED ORDER — PANTOPRAZOLE SODIUM 40 MG IV SOLR
40.0000 mg | Freq: Every day | INTRAVENOUS | Status: DC
  Administered 2022-09-24 – 2022-09-25 (×2): 40 mg via INTRAVENOUS
  Filled 2022-09-24 (×3): qty 10

## 2022-09-24 MED ORDER — SODIUM CHLORIDE 0.9 % IV SOLN: INTRAVENOUS | Status: AC

## 2022-09-24 MED ORDER — ALUM & MAG HYDROXIDE-SIMETH 200-200-20 MG/5ML PO SUSP
30.0000 mL | ORAL | Status: DC | PRN
  Administered 2022-09-24 – 2022-09-26 (×5): 30 mL via ORAL
  Filled 2022-09-24 (×5): qty 30

## 2022-09-24 NOTE — Consult Note (Addendum)
Consultation Note   Referring Provider:  [facility] PCP: [name], MD Primary Gastroenterologist: Dr. Stan Head        Reason for Consultation: Dysphagia  DOA: 09/23/2022         Hospital Day: 2   ASSESSMENT & PLAN   89 y.o. year old female with PMH of A-fib on Eliquis,CHB s/p PPM HFpEF, CKD stage III, asthma, acquire hypothyroidism (s/p thyroidectomy for thyroid CA), IBS, and anxiety.  Dysphagia, esophageal, acute Known history of dysmotility but no unmanageable issues until recent, Suspected to be caused by thrush as seen on examine today. Risk factor to thrush is using Dulera inhaler.  -Educated on oral hygiene after inhaler use. -Start on full liquid diet -Continue daily IV PPI -Continue daily IV diflucan and monitor for improvement -No plans for endoscopic evaluation at this point.  Atrial fibrillation -Eliquis on hold  Elevated Troponin 24 >26. Patient was having chest discomfort but that was possibly esophageal related.  -Will defer to Admitting if there is further workup needed for elevated troponin.  GERD, patient has history of GERD and maintained on Omeprazole 40mg  po daily. -Patient got Protonix 80mg  IV last night in ER.  -Continue daily IV PPI as ordered.  Hypokalemia 3.4/Hyponatremia 126 due to lack of adequate intake likely due to dysphagia -repletion is in progress by admitting team.   new mild decline in Hgb, no overt GI bleeding In August Hgb was 13.0 and currently Hgb 11.8 - will monitor for now  Additional co-morbidities include   Atrial Fibrillation, on eliquis  HISTORY OF PRESENT ILLNESS   Patient presented to ER with complaints of dysphagia that started in July, but has progressively gotten worse. She has not been able to tolerate solid foods and has been mostly on liquid diet. She admits her appetite has decreased. Weight stable. No nausea or vomiting. Afebrile. No SOB.Chest xray shows  cardiomegaly with small bilat pleural effusions.Does have hx of GERD and her Omeprazole was recently increased, however she had difficulty with swallowing her pills. Seen recently in our office for this new issue of dysphagia and the upper GI series did not show any masses or strictures. She was did MBSS, and no follow-up was indicated by SLP. See full reports below. No lower GI symptoms.   Previous GI Evaluations  --MBSS on 7/26 was unremarkable, no aspiration noted.  --Upper GI series on 8/12; 1. Tracheal aspiration with rapid swallowing, triggering a cough response. 2. Nonspecific esophageal dysmotility disorder. 3. Small type 1 hiatal hernia. 4. Suspected mild proximal duodenitis. 5. Incidental diverticulum of the fourth portion of the duodenum.  --Neck soft tissue xray on 8/15 in ER -Negative  --UTD colonoscopy 9/23 --Last EGD 08/2000    Labs and Imaging: Recent Labs    09/23/22 1815 09/24/22 0536  WBC 5.3 4.6  HGB 11.6* 11.8*  HCT 33.9* 35.5*  PLT 171 163   Recent Labs    09/23/22 1815 09/24/22 0536  NA 122* 126*  K 2.7* 3.4*  CL 84* 90*  CO2 25 25  GLUCOSE 93 95  BUN 18 13  CREATININE 1.03* 0.85  CALCIUM 8.0* 8.3*   Recent Labs    09/23/22 1815  PROT 5.8*  ALBUMIN 3.9  AST  51*  ALT 32  ALKPHOS 53  BILITOT 0.9   No results for input(s): "HEPBSAG", "HCVAB", "HEPAIGM", "HEPBIGM" in the last 72 hours. No results for input(s): "LABPROT", "INR" in the last 72 hours.    Past Medical History:  Diagnosis Date   Anxiety    Arthritis    Atrial fibrillation (HCC)    pacemaker, chronic anticoag   Breast cancer (HCC)    Diverticulosis    Fibroma    inner lips/mouth   GERD (gastroesophageal reflux disease)    Hx of breast cancer    Hyperkalemia    Hypertension    Hypothyroidism    IBS (irritable bowel syndrome)    Internal hemorrhoids    Left inguinal hernia    direct   MVP (mitral valve prolapse)    Osteoporosis    Renal insufficiency    Status  post dilation of esophageal narrowing    Thyroid cancer [facility])     Past Surgical History:  Procedure Laterality Date   COLONOSCOPY  10/28/2005   internal hemorrhoids, diverticulosis (same as in 2002 and random bxs negative then)   EP IMPLANTABLE DEVICE N/A 02/26/2015   Procedure: PPM Generator Changeout;  Surgeon: Duke Salvia, MD;  Location: [facility] INVASIVE CV LAB;  Service: Cardiovascular;  Laterality: N/A;   MASTECTOMY  1982   Bilateral   PACEMAKER INSERTION     THYROIDECTOMY     TONSILLECTOMY     UPPER GASTROINTESTINAL ENDOSCOPY  09/06/2000   normal    Family History  Problem Relation Age of Onset   Stroke Mother    Colon cancer Father 45   Heart disease Father    Arthritis Other    Hypertension Other    Lung cancer Son    Esophageal cancer Neg Hx    Pancreatic cancer Neg Hx    Kidney disease Neg Hx    Liver disease Neg Hx    Diabetes Neg Hx    Rectal cancer Neg Hx    Stomach cancer Neg Hx     Prior to Admission medications   Medication Sig Start Date End Date Taking? Authorizing Provider  ELIQUIS 2.5 MG TABS tablet TAKE 1 TABLET BY MOUTH TWICE A DAY 05/31/22  Yes Duke Salvia, MD  levothyroxine (SYNTHROID) 50 MCG tablet Take 1 tablet (50 mcg total) by mouth daily before breakfast. 07/08/22  Yes [name], MD  omeprazole (PRILOSEC) 40 MG capsule Take 1 capsule (40 mg total) by mouth daily. 09/20/22  Yes McMichael, Bayley M, PA-C  acetaminophen (TYLENOL) 650 MG CR tablet Take 1,300 mg by mouth 2 (two) times daily.    [provider]  albuterol (VENTOLIN HFA) 108 (90 Base) MCG/ACT inhaler Inhale 2 puffs into the lungs every 6 (six) hours as needed. 08/03/22   Charlott Holler, MD  diazepam (VALIUM) 2 MG tablet Take 1 tablet (2 mg total) by mouth every 8 (eight) hours as needed for anxiety. 07/09/22   [name], MD  famotidine (PEPCID) 40 MG tablet Take 1 tablet (40 mg total) by mouth at bedtime. 09/14/22   McMichael, Saddie Benders, PA-C  furosemide (LASIX) 40 MG  tablet TAKE 1 TABLET BY MOUTH DAILY; [name] TAKE ADDITIONAL HALF TABLET AS NEEDED FOR WEIGHT GAIN 09/09/22   [name], MD  Homeopathic Products ([employer] EAR PAIN RELIEF OT) Place 3 drops into the left ear as needed (Pain).    [provider]  MISC NATURAL PRODUCTS EX Apply 1 application topically in the morning  and at bedtime. CBD cream for back and ankle pain    [provider]  mometasone-formoterol (DULERA) 100-5 MCG/ACT AERO Inhale 2 puffs into the lungs 2 (two) times daily.    [provider]  Propylene Glycol (SYSTANE COMPLETE OP) Apply 1 drop to eye as needed (dryness).    [provider]  sodium chloride (OCEAN) 0.65 % SOLN nasal spray Place 1 spray into both nostrils as needed for congestion.    [provider]  Wheat Dextrin (BENEFIBER PO) Take 1 Dose by mouth daily.    [provider]    Current Facility-Administered Medications  Medication Dose Route Frequency Provider Last Rate Last Admin   0.9 %  sodium chloride infusion   Intravenous Continuous Alwyn Ren, MD       acetaminophen (TYLENOL) suppository 650 mg  650 mg Rectal Q6H PRN Charlsie Quest, MD   650 mg at 09/24/22 0032   albuterol (PROVENTIL) (2.5 MG/3ML) 0.083% nebulizer solution 2.5 mg  2.5 mg Nebulization Q2H PRN Charlsie Quest, MD       bisacodyl (DULCOLAX) suppository 10 mg  10 mg Rectal Daily PRN Charlsie Quest, MD       fluconazole (DIFLUCAN) IVPB 200 mg  200 mg Intravenous Q24H Darreld Mclean R, MD       heparin injection 5,000 Units  5,000 Units Subcutaneous Q8H Darreld Mclean R, MD   5,000 Units at 09/24/22 0656   HYDROmorphone (DILAUDID) injection 0.5 mg  0.5 mg Intravenous Q3H PRN Charlsie Quest, MD       [START ON 10/01/2022] levothyroxine (SYNTHROID, LEVOTHROID) injection 25 mcg  25 mcg Intravenous Daily Alwyn Ren, MD       LORazepam (ATIVAN) injection 0.5 mg  0.5 mg Intravenous Q8H PRN Darreld Mclean R, MD   0.5 mg at 09/24/22 0216    ondansetron (ZOFRAN) injection 4 mg  4 mg Intravenous Q6H PRN Charlsie Quest, MD       Oral care mouth rinse  15 mL Mouth Rinse PRN Darreld Mclean R, MD       potassium chloride 10 mEq in 100 mL IVPB  10 mEq Intravenous Q1 Hr x 4 Alwyn Ren, MD       sodium chloride flush (NS) 0.9 % injection 3 mL  3 mL Intravenous Q12H Charlsie Quest, MD        Allergies as of 09/23/2022 - Review Complete 09/23/2022  Allergen Reaction Noted   Penicillins Diarrhea 03/30/2021    Social History   Socioeconomic History   Marital status: Widowed    Spouse name: Not on file   Number of children: 3   Years of education: Not on file   Highest education level: Not on file  Occupational History   Occupation: Retired    Occupation: retired  Tobacco Use   Smoking status: Former    Current packs/day: 0.00    Types: Cigarettes    Quit date: 02/08/1954    Years since quitting: 68.6   Smokeless tobacco: Never  Vaping Use   Vaping status: Never Used  Substance and Sexual Activity   Alcohol use: No    Alcohol/week: 0.0 standard drinks of alcohol   Drug use: No   Sexual activity: Not Currently  Other Topics Concern   Not on file  Social History Narrative   Regular Exercise: Yes   Daily Caffeine Use: Sometimes.            Social Determinants of Health  Financial Resource Strain: Low Risk  (12/18/2021)   Overall Financial Resource Strain (CARDIA)    Difficulty of Paying Living Expenses: Not hard at all  Food Insecurity: No Food Insecurity (09/24/2022)   Hunger Vital Sign    Worried About Running Out of Food in the Last Year: Never true    Ran Out of Food in the Last Year: Never true  Transportation Needs: No Transportation Needs (09/24/2022)   PRAPARE - Administrator, Civil Service (Medical): No    Lack of Transportation (Non-Medical): No  Physical Activity: Sufficiently Active (12/18/2021)   Exercise Vital Sign    Days of Exercise per Week: 5 days    Minutes of Exercise  per Session: 30 min  Stress: No Stress Concern Present (12/18/2021)   [employer] of Occupational Health - Occupational Stress Questionnaire    Feeling of Stress : Not at all  Social Connections: Moderately Integrated (12/18/2021)   Social Connection and Isolation Panel [NHANES]    Frequency of Communication with Friends and Family: More than three times a week    Frequency of Social Gatherings with Friends and Family: More than three times a week    Attends Religious Services: More than 4 times per year    Active Member of [name] or Organizations: Yes    Attends Banker Meetings: More than 4 times per year    Marital Status: Widowed  Intimate Partner Violence: Not At Risk (09/24/2022)   Humiliation, Afraid, Rape, and Kick questionnaire    Fear of Current or Ex-Partner: No    Emotionally Abused: No    Physically Abused: No    Sexually Abused: No     Code Status   Code Status: DNR  Review of Systems: All systems reviewed and negative except where noted in HPI.  Physical Exam: Vital signs in last 24 hours: Temp:  [97.7 F (36.5 C)-98.2 F (36.8 C)] 98.2 F (36.8 C) (08/16 0832) Pulse Rate:  [58-65] 65 (08/16 0832) Resp:  [14-22] 20 (08/16 0832) BP: (125-166)/(66-98) 125/84 (08/16 0832) SpO2:  [93 %-100 %] 93 % (08/16 0832) Weight:  [46.3 kg-49.9 kg] 49.9 kg (08/15 2359) Last BM Date : 09/23/22  General:  Pleasant female in NAD, very thin Psych:  Cooperative. Normal mood and affect Eyes: Pupils equal Ears:  Normal auditory acuity Oral: white film noted on base of tongue Nose: No deformity, discharge or lesions Neck:  Supple, no masses felt Lungs:  Clear to auscultation.  Heart:  Regular rate, regular rhythm.  Abdomen:  Soft, nondistended, nontender, active bowel sounds, no masses felt Rectal :  Deferred Msk: Symmetrical without gross deformities.  Neurologic:  Alert, oriented, grossly normal neurologically Extremities : No edema Skin:  Intact  without significant lesions.    Intake/Output from previous day: 08/15 0701 - 08/16 0700 In: 820.3 [I.V.:342.7; IV Piggyback:477.6] Out: 450 [Urine:450] Intake/Output this shift:  Total I/O In: -  Out: 850 [Urine:850]  Principal Problem:   Dysphagia Active Problems:   Hypothyroidism   Pacemaker-dual chamber    Chronic kidney disease, stage 3b (HCC)   Permanent atrial fibrillation (HCC)   GAD (generalized anxiety disorder)   (HFpEF) heart failure with preserved ejection fraction (HCC)   Hyponatremia   Mild asthma   Hypokalemia    Alyssa May, NP @  09/24/2022, 10:20 AM    Attending physician's note   I have taken history, reviewed the chart and examined the patient. I performed a substantive portion of this encounter, including complete  performance of at least one of the key components, in conjunction with the APP. I agree with the Advanced Practitioner's note, impression and recommendations.   Esophageal dysphagia- d/t esophageal dysmotility on recent UGI series with small HH. No strictures.  Neg MBS Oral thrush-suspected esophageal candidiasis, likely exacerbated by steroid inhalers.  Doing much better on Diflucan GERD   Plan: -Can switch IV to p.o. Diflucan 200 mg x 1, then 100mg  day 2-14. -Continue Protonix. -No need for EGD this adm. Can always consider it as outpt if continued problems. -Advance diet as tolerated. -Rinse mouth/gargle after using inhalers. -Will sign off for now. -Will arrange for GI FU   Edman Circle, MD [facility] [phone]

## 2022-09-24 NOTE — Progress Notes (Signed)
PROGRESS NOTE    Alyssa [name]  ZOX:[id number] DOB: May 15, 1924 DOA: 09/23/2022 PCP: [name], MD  Brief Narrative: 89 y.o. female with medical history significant for permanent A-fib on Eliquis, CHB s/p PPM, chronic HFpEF, severe TR, CKD stage IIIb, asthma, acquired hypothyroidism (s/p thyroidectomy for thyroid cancer), anxiety who presented to the ED for evaluation of dysphagia.   Patient states she has been having issues with swallowing difficulty over the last month.  She has not been able to tolerate solid foods and has been mostly on a liquid diet.     She has been following with [facility].  She underwent modified barium swallow study 7/26 which was largely unrevealing.  She then underwent UGI series 8/12 which was notable for tracheal aspiration with rapid swallowing, nonspecific esophageal dysmotility disorder, small type I hiatal hernia.   Patient states that she has been having continued difficulty swallowing including liquids and pills.  Today she took 2 omeprazole pills and developed sensation that they were stuck in her lower throat area.  She feels like she sometimes chokes when drinking liquids.  She has not been able to tolerate solids at all.  She was previously taking oral potassium pills however this was discontinued 2 weeks ago after she was found to have elevated potassium with outpatient labs.   ED Course  Labs/Imaging on admission: I have personally reviewed following labs and imaging studies.   Initial vitals showed BP 135/79, pulse 60, RR 14, temp 98.2 F, SpO2 100% on room air. Labs show sodium 122, potassium 2.7, magnesium 1.9, chloride 84, bicarb 25, BUN 18, creatinine 1.03, serum glucose 93, lipase 46, WBC 5.3, hemoglobin 11.6, platelets 171,000, troponin 24 > 26 2 view chest x-ray showed cardiomegaly with small bilateral pleural effusions.  Left-sided pacing device seen in place.   Soft tissue neck x-ray was negative for radiopaque foreign body.  Cervical  airway unremarkable.  No evidence of retropharyngeal soft tissue swelling or epiglottic enlargement.   Patient was given 1 L LR, IV K 10 mEq x 5, IV Protonix 80 mg, IV fluconazole 200 mg.  EDP spoke with ENT Dr. Suszanne Conners and [facility] Dr. Marina Goodell who will see in consultation.  The hospitalist service was consulted to admit for further evaluation and management.    Assessment & Plan:   Principal Problem:   Dysphagia Active Problems:   Hyponatremia   Hypokalemia   Hypothyroidism   Pacemaker-dual chamber    Chronic kidney disease, stage 3b (HCC)   Permanent atrial fibrillation (HCC)   GAD (generalized anxiety disorder)   (HFpEF) heart failure with preserved ejection fraction (HCC)   Mild asthma   Dysphagia: Patient complains of dysphagia with solids liquids and pills.  She reports weight loss unclear exactly how much.  This has been going on for a while and is being followed by GI.   ? Thrush on diflucan  ENT notes reviewed. GI to see her today.   Hypovolemic hyponatremia: Sodium improved to 126 from 122 on admission with normal saline.  Continue to hold Lasix.   Hypokalemia: Potassium 3.4 from 2.7 on admission.  Replete and recheck labs in AM.  Holding Lasix.   CKD stage IIIb: Creatinine 0.85 from 1.03 at baseline   Permanent atrial fibrillation CHB s/p PPM: In V-paced rhythm on admission.  Eliquis on hold while NPO.  Resume as soon as able.   Asthma: Stable without active wheezing.  Hold Dulera given concern for candidiasis.  Continue albuterol nebulizers as needed.  Chronic HFpEF with severe TR: EF 55-60%.  Appears hypovolemic as above.  Holding Lasix and monitor while on IV fluid hydration.   Hypothyroidism: Change Synthroid to IV.   Anxiety: Holding home Valium.  Can use IV Ativan 0.5 mg q8h prn for anxiety.    Estimated body mass index is 17.74 kg/m as calculated from the following:   Height as of this encounter: 5\' 6"  (1.676 m).   Weight as of this encounter: 49.9  kg.  DVT prophylaxis:heparin Code Status: DNR  Family Communication: None at bedside  disposition Plan:  Status is: Inpatient Remains inpatient appropriate because: Dysphagia to solids and liquids unable to take p.o. on IV fluids hyponatremia hypokalemia   Consultants:  GI and ENT  Procedures: None Antimicrobials: None  Subjective: Patient reports ongoing difficulty swallowing she lives alone her husband died her 2 sons died she is she states she has good neighbors Not been able to eat much  Objective: Vitals:   09/23/22 2315 09/23/22 2359 09/24/22 0358 09/24/22 0832  BP: (!) 156/73 (!) 159/70 130/70 125/84  Pulse: 60 62 61 65  Resp: 17 18 18 20   Temp:  98.2 F (36.8 C) 97.9 F (36.6 C) 98.2 F (36.8 C)  TempSrc:  Oral Oral Oral  SpO2: 97% 99% 94% 93%  Weight:  49.9 kg    Height:        Intake/Output Summary (Last 24 hours) at 09/24/2022 0919 Last data filed at 09/24/2022 0729 Gross per 24 hour  Intake 820.32 ml  Output 850 ml  Net -29.68 ml   Filed Weights   09/23/22 1702 09/23/22 2359  Weight: 46.3 kg 49.9 kg    Examination:  General exam: Appears chronically ill appearing Respiratory system: Clear to auscultation. Respiratory effort normal. Cardiovascular system: S1 & S2 heard, RRR. No JVD, murmurs, rubs, gallops or clicks. No pedal edema. Gastrointestinal system: Abdomen is nondistended, soft and nontender. No organomegaly or masses felt. Normal bowel sounds heard. Central nervous system: Alert and oriented. No focal neurological deficits. Extremities: no edema  Data Reviewed: I have personally reviewed following labs and imaging studies  CBC: Recent Labs  Lab 09/23/22 1815 09/24/22 0536  WBC 5.3 4.6  NEUTROABS 3.6  --   HGB 11.6* 11.8*  HCT 33.9* 35.5*  MCV 93.4 95.7  PLT 171 163   Basic Metabolic Panel: Recent Labs  Lab 09/23/22 1815 09/23/22 2010 09/24/22 0536  NA 122*  --  126*  K 2.7*  --  3.4*  CL 84*  --  90*  CO2 25  --  25   GLUCOSE 93  --  95  BUN 18  --  13  CREATININE 1.03*  --  0.85  CALCIUM 8.0*  --  8.3*  MG  --  1.9  --    GFR: Estimated Creatinine Clearance: 30.5 mL/min (by C-G formula based on SCr of 0.85 mg/dL). Liver Function Tests: Recent Labs  Lab 09/23/22 1815  AST 51*  ALT 32  ALKPHOS 53  BILITOT 0.9  PROT 5.8*  ALBUMIN 3.9   Recent Labs  Lab 09/23/22 1815  LIPASE 46   No results for input(s): "AMMONIA" in the last 168 hours. Coagulation Profile: No results for input(s): "INR", "PROTIME" in the last 168 hours. Cardiac Enzymes: No results for input(s): "CKTOTAL", "CKMB", "CKMBINDEX", "TROPONINI" in the last 168 hours. BNP (last 3 results) No results for input(s): "PROBNP" in the last 8760 hours. HbA1C: No results for input(s): "HGBA1C" in the last 72 hours. CBG:  No results for input(s): "GLUCAP" in the last 168 hours. Lipid Profile: No results for input(s): "CHOL", "HDL", "LDLCALC", "TRIG", "CHOLHDL", "LDLDIRECT" in the last 72 hours. Thyroid Function Tests: No results for input(s): "TSH", "T4TOTAL", "FREET4", "T3FREE", "THYROIDAB" in the last 72 hours. Anemia Panel: No results for input(s): "VITAMINB12", "FOLATE", "FERRITIN", "TIBC", "IRON", "RETICCTPCT" in the last 72 hours. Sepsis Labs: No results for input(s): "PROCALCITON", "LATICACIDVEN" in the last 168 hours.  No results found for this or any previous visit (from the past 240 hour(s)).    Radiology Studies: DG Chest 2 View  Result Date: 09/23/2022 CLINICAL DATA:  Chest pain EXAM: CHEST - 2 VIEW COMPARISON:  03/28/2021, CT 05/04/2016 FINDINGS: Left-sided pacing device as before. Cardiomegaly. Small bilateral pleural effusions. Aortic atherosclerosis. Bilateral breast implants. Clips in the right axilla. No radiopaque foreign body over the chest. IMPRESSION: Cardiomegaly with small bilateral pleural effusions. Electronically Signed   By: Jasmine Pang M.D.   On: 09/23/2022 19:25   DG Neck Soft Tissue  Result  Date: 09/23/2022 CLINICAL DATA:  Medication stuck in throat EXAM: NECK SOFT TISSUES - 1+ VIEW COMPARISON:  None Available. FINDINGS: There is no evidence of retropharyngeal soft tissue swelling or epiglottic enlargement. The cervical airway is unremarkable and no radio-opaque foreign body identified. IMPRESSION: Negative. Electronically Signed   By: Charline Bills M.D.   On: 09/23/2022 19:24     Scheduled Meds:  heparin  5,000 Units Subcutaneous Q8H   sodium chloride flush  3 mL Intravenous Q12H   Continuous Infusions:  fluconazole (DIFLUCAN) IV       LOS: 1 day    Time spent: 39 min Alwyn Ren, MD 09/24/2022, 9:19 AM

## 2022-09-24 NOTE — Plan of Care (Signed)
  Problem: Education: Goal: Knowledge of General Education information will improve Description: Including pain rating scale, medication(s)/side effects and non-pharmacologic comfort measures Outcome: Progressing   Problem: Health Behavior/Discharge Planning: Goal: Ability to manage health-related needs will improve Outcome: Progressing   Problem: Clinical Measurements: Goal: Will remain free from infection Outcome: Progressing Goal: Cardiovascular complication will be avoided Outcome: Progressing   Problem: Activity: Goal: Risk for activity intolerance will decrease Outcome: Progressing   Problem: Coping: Goal: Level of anxiety will decrease Outcome: Progressing   Problem: Elimination: Goal: Will not experience complications related to bowel motility Outcome: Progressing   Problem: Pain Managment: Goal: General experience of comfort will improve Outcome: Progressing   Problem: Safety: Goal: Ability to remain free from injury will improve Outcome: Progressing   Problem: Skin Integrity: Goal: Risk for impaired skin integrity will decrease Outcome: Progressing

## 2022-09-24 NOTE — Progress Notes (Signed)
Contacted by ER MD regarding the patient.  She was previously seen by me in my office for her hearing loss and epistaxis. My practice is restricted to otology and rhinology.  Nonetheless, I did review her recent workups, including her modified barium swallow study and UGI series.  It appears the patient's symptoms are secondary to functional/neurologic issues.  No anatomic abnormality was noted.  There is no obvious ENT intervention that would help with the patient's symptoms.  If no GI intervention is available, the patient may benefit from placement of a G-tube.  I will stop by later today for a bedside visit.

## 2022-09-25 DIAGNOSIS — E876 Hypokalemia: Secondary | ICD-10-CM | POA: Diagnosis not present

## 2022-09-25 LAB — CBC
HCT: 35.2 % — ABNORMAL LOW (ref 36.0–46.0)
Hemoglobin: 12 g/dL (ref 12.0–15.0)
MCH: 32.5 pg (ref 26.0–34.0)
MCHC: 34.1 g/dL (ref 30.0–36.0)
MCV: 95.4 fL (ref 80.0–100.0)
Platelets: 154 10*3/uL (ref 150–400)
RBC: 3.69 MIL/uL — ABNORMAL LOW (ref 3.87–5.11)
RDW: 11.6 % (ref 11.5–15.5)
WBC: 3.8 10*3/uL — ABNORMAL LOW (ref 4.0–10.5)
nRBC: 0 % (ref 0.0–0.2)

## 2022-09-25 LAB — COMPREHENSIVE METABOLIC PANEL
ALT: 29 U/L (ref 0–44)
AST: 46 U/L — ABNORMAL HIGH (ref 15–41)
Albumin: 3.3 g/dL — ABNORMAL LOW (ref 3.5–5.0)
Alkaline Phosphatase: 51 U/L (ref 38–126)
Anion gap: 9 (ref 5–15)
BUN: 10 mg/dL (ref 8–23)
CO2: 23 mmol/L (ref 22–32)
Calcium: 8.1 mg/dL — ABNORMAL LOW (ref 8.9–10.3)
Chloride: 101 mmol/L (ref 98–111)
Creatinine, Ser: 0.95 mg/dL (ref 0.44–1.00)
GFR, Estimated: 55 mL/min — ABNORMAL LOW (ref >60)
Glucose, Bld: 84 mg/dL (ref 70–99)
Potassium: 3.9 mmol/L (ref 3.5–5.1)
Sodium: 133 mmol/L — ABNORMAL LOW (ref 135–145)
Total Bilirubin: 1 mg/dL (ref 0.3–1.2)
Total Protein: 5.2 g/dL — ABNORMAL LOW (ref 6.5–8.1)

## 2022-09-25 LAB — MAGNESIUM: Magnesium: 2.1 mg/dL (ref 1.7–2.4)

## 2022-09-25 MED ORDER — SIMETHICONE 80 MG PO CHEW: 80.0000 mg | CHEWABLE_TABLET | Freq: Four times a day (QID) | ORAL | Status: DC | PRN

## 2022-09-25 MED ORDER — ALBUTEROL SULFATE HFA 108 (90 BASE) MCG/ACT IN AERS: 2.0000 | INHALATION_SPRAY | Freq: Four times a day (QID) | RESPIRATORY_TRACT | Status: DC | PRN

## 2022-09-25 MED ORDER — SODIUM CHLORIDE 0.9 % IV SOLN: INTRAVENOUS | Status: DC

## 2022-09-25 MED ORDER — MOMETASONE FURO-FORMOTEROL FUM 100-5 MCG/ACT IN AERO
2.0000 | INHALATION_SPRAY | Freq: Two times a day (BID) | RESPIRATORY_TRACT | Status: DC
  Administered 2022-09-25 – 2022-09-26 (×2): 2 via RESPIRATORY_TRACT
  Filled 2022-09-25: qty 8.8

## 2022-09-25 NOTE — Progress Notes (Signed)
PROGRESS NOTE    Alyssa [name]  WUJ:[id number] DOB: 27-Feb-1924 DOA: 09/23/2022 PCP: [name], MD  Brief Narrative: 89 y.o. female with medical history significant for permanent A-fib on Eliquis, CHB s/p PPM, chronic HFpEF, severe TR, CKD stage IIIb, asthma, acquired hypothyroidism (s/p thyroidectomy for thyroid cancer), anxiety who presented to the ED for evaluation of dysphagia.   Patient states she has been having issues with swallowing difficulty over the last month.  She has not been able to tolerate solid foods and has been mostly on a liquid diet.     She has been following with [facility].  She underwent modified barium swallow study 7/26 which was largely unrevealing.  She then underwent UGI series 8/12 which was notable for tracheal aspiration with rapid swallowing, nonspecific esophageal dysmotility disorder, small type I hiatal hernia.   Patient states that she has been having continued difficulty swallowing including liquids and pills.  Today she took 2 omeprazole pills and developed sensation that they were stuck in her lower throat area.  She feels like she sometimes chokes when drinking liquids.  She has not been able to tolerate solids at all.  She was previously taking oral potassium pills however this was discontinued 2 weeks ago after she was found to have elevated potassium with outpatient labs.   ED Course  Labs/Imaging on admission: I have personally reviewed following labs and imaging studies.   Initial vitals showed BP 135/79, pulse 60, RR 14, temp 98.2 F, SpO2 100% on room air. Labs show sodium 122, potassium 2.7, magnesium 1.9, chloride 84, bicarb 25, BUN 18, creatinine 1.03, serum glucose 93, lipase 46, WBC 5.3, hemoglobin 11.6, platelets 171,000, troponin 24 > 26 2 view chest x-ray showed cardiomegaly with small bilateral pleural effusions.  Left-sided pacing device seen in place.   Soft tissue neck x-ray was negative for radiopaque foreign body.  Cervical  airway unremarkable.  No evidence of retropharyngeal soft tissue swelling or epiglottic enlargement.   Patient was given 1 L LR, IV K 10 mEq x 5, IV Protonix 80 mg, IV fluconazole 200 mg.  EDP spoke with ENT Dr. Suszanne Conners and [facility] Dr. Marina Goodell who will see in consultation.  The hospitalist service was consulted to admit for further evaluation and management.    Assessment & Plan:   Principal Problem:   Dysphagia Active Problems:   Hyponatremia   Hypokalemia   Hypothyroidism   Pacemaker-dual chamber    Chronic kidney disease, stage 3b (HCC)   Permanent atrial fibrillation (HCC)   GAD (generalized anxiety disorder)   (HFpEF) heart failure with preserved ejection fraction (HCC)   Mild asthma   Dysphagia: Patient complains of dysphagia with solids liquids and pills.  She reports weight loss unclear exactly how much.  This has been going on for a while and is being followed by GI.   ? Thrush on diflucan  ENT notes reviewed. Appreciate GI input no plans for endoscopic evaluation recommended to continue Diflucan   Hypovolemic hyponatremia: Sodium improved to 133 from 126 from 122 on admission with normal saline.  Continue to hold Lasix.   Hypokalemia: Resolved.  Potassium 3.4 from 2.7 on admission.  Replete and recheck labs in AM.  Holding Lasix.   CKD stage IIIb: Creatinine 0.85 from 1.03 at baseline   Permanent atrial fibrillation CHB s/p PPM: In V-paced rhythm on admission.  Eliquis on hold while NPO.  Resume as soon as able.   Asthma: Stable without active wheezing.  Hold  Dulera given concern for candidiasis.  Continue albuterol nebulizers as needed.   Chronic HFpEF with severe TR: EF 55-60%.  Appears hypovolemic as above.  Holding Lasix and monitor while on IV fluid hydration.   Hypothyroidism: Change Synthroid to IV.   Anxiety: Holding home Valium.  Can use IV Ativan 0.5 mg q8h prn for anxiety.    Estimated body mass index is 17.74 kg/m as calculated from the  following:   Height as of this encounter: 5\' 6"  (1.676 m).   Weight as of this encounter: 49.9 kg.  DVT prophylaxis:heparin Code Status: DNR  Family Communication: None at bedside  disposition Plan:  Status is: Inpatient Remains inpatient appropriate because: Dysphagia to solids and liquids unable to take p.o. on IV fluids hyponatremia hypokalemia   Consultants:  GI and ENT  Procedures: None Antimicrobials: None  Subjective:  Patient sitting up complains of a lot of gas Objective: Vitals:   09/24/22 1434 09/24/22 2057 09/25/22 0510 09/25/22 1306  BP: (!) 144/70 115/66 (!) 152/75 131/66  Pulse: 65 71 62 70  Resp: 20 15 15 18   Temp: 98.1 F (36.7 C) 97.6 F (36.4 C) 98.2 F (36.8 C) 98.4 F (36.9 C)  TempSrc: Oral Oral Oral Oral  SpO2: 96% 99% 98% 98%  Weight:      Height:        Intake/Output Summary (Last 24 hours) at 09/25/2022 1418 Last data filed at 09/25/2022 1300 Gross per 24 hour  Intake 1600.66 ml  Output 127 ml  Net 1473.66 ml   Filed Weights   09/23/22 1702 09/23/22 2359  Weight: 46.3 kg 49.9 kg    Examination:  General exam: Appears chronically ill appearing Respiratory system: Clear to auscultation. Respiratory effort normal. Cardiovascular system: S1 & S2 heard, RRR. No JVD, murmurs, rubs, gallops or clicks. No pedal edema. Gastrointestinal system: Abdomen is nondistended, soft and nontender. No organomegaly or masses felt. Normal bowel sounds heard. Central nervous system: Alert and oriented. No focal neurological deficits. Extremities: no edema  Data Reviewed: I have personally reviewed following labs and imaging studies  CBC: Recent Labs  Lab 09/23/22 1815 09/24/22 0536 09/25/22 0658  WBC 5.3 4.6 3.8*  NEUTROABS 3.6  --   --   HGB 11.6* 11.8* 12.0  HCT 33.9* 35.5* 35.2*  MCV 93.4 95.7 95.4  PLT 171 163 154   Basic Metabolic Panel: Recent Labs  Lab 09/23/22 1815 09/23/22 2010 09/24/22 0536 09/24/22 1121 09/25/22 0658  NA 122*   --  126*  --  133*  K 2.7*  --  3.4*  --  3.9  CL 84*  --  90*  --  101  CO2 25  --  25  --  23  GLUCOSE 93  --  95  --  84  BUN 18  --  13  --  10  CREATININE 1.03*  --  0.85  --  0.95  CALCIUM 8.0*  --  8.3*  --  8.1*  MG  --  1.9  --  1.9 2.1   GFR: Estimated Creatinine Clearance: 27.3 mL/min (by C-G formula based on SCr of 0.95 mg/dL). Liver Function Tests: Recent Labs  Lab 09/23/22 1815 09/25/22 0658  AST 51* 46*  ALT 32 29  ALKPHOS 53 51  BILITOT 0.9 1.0  PROT 5.8* 5.2*  ALBUMIN 3.9 3.3*   Recent Labs  Lab 09/23/22 1815  LIPASE 46   No results for input(s): "AMMONIA" in the last 168 hours. Coagulation Profile: No  results for input(s): "INR", "PROTIME" in the last 168 hours. Cardiac Enzymes: No results for input(s): "CKTOTAL", "CKMB", "CKMBINDEX", "TROPONINI" in the last 168 hours. BNP (last 3 results) No results for input(s): "PROBNP" in the last 8760 hours. HbA1C: No results for input(s): "HGBA1C" in the last 72 hours. CBG: No results for input(s): "GLUCAP" in the last 168 hours. Lipid Profile: No results for input(s): "CHOL", "HDL", "LDLCALC", "TRIG", "CHOLHDL", "LDLDIRECT" in the last 72 hours. Thyroid Function Tests: No results for input(s): "TSH", "T4TOTAL", "FREET4", "T3FREE", "THYROIDAB" in the last 72 hours. Anemia Panel: No results for input(s): "VITAMINB12", "FOLATE", "FERRITIN", "TIBC", "IRON", "RETICCTPCT" in the last 72 hours. Sepsis Labs: No results for input(s): "PROCALCITON", "LATICACIDVEN" in the last 168 hours.  No results found for this or any previous visit (from the past 240 hour(s)).    Radiology Studies: DG Chest 2 View  Result Date: 09/23/2022 CLINICAL DATA:  Chest pain EXAM: CHEST - 2 VIEW COMPARISON:  03/28/2021, CT 05/04/2016 FINDINGS: Left-sided pacing device as before. Cardiomegaly. Small bilateral pleural effusions. Aortic atherosclerosis. Bilateral breast implants. Clips in the right axilla. No radiopaque foreign body over  the chest. IMPRESSION: Cardiomegaly with small bilateral pleural effusions. Electronically Signed   By: Jasmine Pang M.D.   On: 09/23/2022 19:25   DG Neck Soft Tissue  Result Date: 09/23/2022 CLINICAL DATA:  Medication stuck in throat EXAM: NECK SOFT TISSUES - 1+ VIEW COMPARISON:  None Available. FINDINGS: There is no evidence of retropharyngeal soft tissue swelling or epiglottic enlargement. The cervical airway is unremarkable and no radio-opaque foreign body identified. IMPRESSION: Negative. Electronically Signed   By: Charline Bills M.D.   On: 09/23/2022 19:24     Scheduled Meds:  heparin  5,000 Units Subcutaneous Q8H   [START ON 10/01/2022] levothyroxine  25 mcg Intravenous Daily   pantoprazole (PROTONIX) IV  40 mg Intravenous Daily   sodium chloride flush  3 mL Intravenous Q12H   Continuous Infusions:  fluconazole (DIFLUCAN) IV 200 mg (09/24/22 2110)     LOS: 2 days    Time spent: 39 min Alwyn Ren, MD 09/25/2022, 2:18 PM

## 2022-09-26 ENCOUNTER — Encounter: Payer: Self-pay | Admitting: Internal Medicine

## 2022-09-26 DIAGNOSIS — R1310 Dysphagia, unspecified: Secondary | ICD-10-CM | POA: Diagnosis not present

## 2022-09-26 MED ORDER — FLUCONAZOLE 100 MG PO TABS
100.0000 mg | ORAL_TABLET | Freq: Every day | ORAL | 0 refills | Status: DC
Start: 2022-09-26 — End: 2022-10-06

## 2022-09-26 NOTE — Progress Notes (Signed)
IV in Rt upper arm infiltrated, it was discontinued and ice applied. Alyssa [name] was discharged to go home. Daughter is at bedside. Alyssa [name] states she is moving to [facility] with her daughter on Wednesday.Discharge instructions explained to daughter. One prescription needs to be picked up at her pharmacy. Next medications due written on medication sheet. Patient was discharge via wheelchair.

## 2022-09-26 NOTE — Plan of Care (Signed)

## 2022-09-26 NOTE — Plan of Care (Signed)
  Problem: Health Behavior/Discharge Planning: Goal: Ability to manage health-related needs will improve Outcome: Adequate for Discharge   Problem: Clinical Measurements: Goal: Ability to maintain clinical measurements within normal limits will improve Outcome: Adequate for Discharge Goal: Diagnostic test results will improve Outcome: Adequate for Discharge   Problem: Activity: Goal: Risk for activity intolerance will decrease Outcome: Adequate for Discharge   Problem: Nutrition: Goal: Adequate nutrition will be maintained Outcome: Adequate for Discharge   Problem: Coping: Goal: Level of anxiety will decrease Outcome: Adequate for Discharge

## 2022-09-26 NOTE — Evaluation (Signed)
Physical Therapy Evaluation Patient Details Name: Rajinder Rosenberry MRN: [id number] DOB: 06/24/1924 Today's Date: 09/26/2022  History of Present Illness  89 y.o. female with medical history significant for permanent A-fib on Eliquis, CHB s/p PPM, chronic HFpEF, severe TR, CKD stage IIIb, asthma, acquired hypothyroidism (s/p thyroidectomy for thyroid cancer), anxiety who presented to the ED for evaluation of dysphagia.  Clinical Impression      Patient evaluated by Physical Therapy with no further acute PT needs identified. All education has been completed and the patient has no further questions.  Pt is very pleasant and motivated to mobilize. Initially with guarded gait which improved with distance. SPO2=93% on RA with HR in 80s. Pt midly dyspneic and recovered with seated rest.  Discussed additional PT at d/c and pt feels she will be able to work on slowly incr activity as tol, PT in agreement; encouraged pt to use the rollator  for safety and fall prevention (she is borrowing rollator from her nbor)  for a few days or  until she is back to her baseline   See below for any follow-up Physical Therapy or equipment needs. PT is signing off. Thank you for this referral.      Can travel by private vehicle        Equipment Recommendations None recommended by PT  Recommendations for Other Services       Functional Status Assessment Patient has had a recent decline in their functional status and demonstrates the ability to make significant improvements in function in a reasonable and predictable amount of time.     Precautions / Restrictions Precautions Precautions: Fall Restrictions Weight Bearing Restrictions: No      Mobility  Bed Mobility Overal bed mobility: Modified Independent                  Transfers Overall transfer level: Modified independent                      Ambulation/Gait Ambulation/Gait assistance: Contact guard assist, Supervision Gait  Distance (Feet): 180 Feet Assistive device: None Gait Pattern/deviations: Step-through pattern, Decreased stride length, Drifts right/left       General Gait Details: initially with decr stride length, Narrow BOS and guarded gait. deviations diminished with incr distance with noted incr stride lenght, incr arm swing and less drifitng (which was mild initially). no overt LOB  Stairs            Wheelchair Mobility     Tilt Bed    Modified Rankin (Stroke Patients Only)       Balance Overall balance assessment: Needs assistance Sitting-balance support: No upper extremity supported, Feet supported Sitting balance-Leahy Scale: Normal       Standing balance-Leahy Scale: Fair Standing balance comment: not tested to mod challenges             High level balance activites: Side stepping, Direction changes, Head turns High Level Balance Comments: CGA for safety, no overt LOB             Pertinent Vitals/Pain Pain Assessment Pain Assessment: No/denies pain    Home Living Family/patient expects to be discharged to:: Private residence Living Arrangements: Alone Available Help at Discharge: Family (dtr here from [employer]) Type of Home: Other(Comment) (condo on 3rd floor) Home Access: Elevator (pt prefers to take the steps)       Home Layout: One level Home Equipment: None      Prior Function Prior Level of Function : Independent/Modified  Independent;Driving             Mobility Comments: denies falls; very independent at her baseline; pt is active and  enjoys  doing yoga; pt is planning to move to [place] to be near her dtr in September       Extremity/Trunk Assessment   Upper Extremity Assessment Upper Extremity Assessment: Overall WFL for tasks assessed    Lower Extremity Assessment Lower Extremity Assessment: Overall WFL for tasks assessed       Communication   Communication Communication: No apparent difficulties  Cognition Arousal:  Alert Behavior During Therapy: WFL for tasks assessed/performed Overall Cognitive Status: Within Functional Limits for tasks assessed                                          General Comments      Exercises     Assessment/Plan    PT Assessment All further PT needs can be met in the next venue of care  PT Problem List         PT Treatment Interventions      PT Goals (Current goals can be found in the Care Plan section)  Acute Rehab PT Goals Patient Stated Goal: home soon PT Goal Formulation: All assessment and education complete, DC therapy    Frequency       Co-evaluation               AM-PAC PT "6 Clicks" Mobility  Outcome Measure Help needed turning from your back to your side while in a flat bed without using bedrails?: None Help needed moving from lying on your back to sitting on the side of a flat bed without using bedrails?: None Help needed moving to and from a bed to a chair (including a wheelchair)?: None Help needed standing up from a chair using your arms (e.g., wheelchair or bedside chair)?: None Help needed to walk in hospital room?: A Little Help needed climbing 3-5 steps with a railing? : A Little 6 Click Score: 22    End of Session Equipment Utilized During Treatment: Gait belt Activity Tolerance: Patient tolerated treatment well Patient left: in chair;with call bell/phone within reach   PT Visit Diagnosis: Other abnormalities of gait and mobility (R26.89)    Time: 6010-9323 PT Time Calculation (min) (ACUTE ONLY): 15 min   Charges:   PT Evaluation $PT Eval Low Complexity: 1 Low   PT General Charges $$ ACUTE PT VISIT: 1 Visit         Yer Olivencia, PT  Acute Rehab Dept [facility]) [phone]  09/26/2022   [facility] 09/26/2022, 1:32 PM

## 2022-09-26 NOTE — Discharge Summary (Signed)
Physician Discharge Summary  Alyssa [name] RUE:[id number] DOB: 1924-07-09 DOA: 09/23/2022  PCP: [name], MD  Admit date: 09/23/2022 Discharge date: 09/26/2022  Admitted From: home Disposition: home Recommendations for Outpatient Follow-up:  Follow up with PCP in 1-2 weeks Please obtain BMP/CBC in one week  Home Health:yes Equipment/Devices:none Discharge Condition:stable CODE STATUS:dnr Diet recommendation: cardiac Brief/Interim Summary: 89 y.o. female with medical history significant for permanent A-fib on Eliquis, CHB s/p PPM, chronic HFpEF, severe TR, CKD stage IIIb, asthma, acquired hypothyroidism (s/p thyroidectomy for thyroid cancer), anxiety who presented to the ED for evaluation of dysphagia.Patient states she has been having issues with swallowing difficulty over the last month.  She has not been able to tolerate solid foods and has been mostly on a liquid diet.    She has been following with [facility].  She underwent modified barium swallow study 7/26 which was largely unrevealing.  She then underwent UGI series 8/12 which was notable for tracheal aspiration with rapid swallowing, nonspecific esophageal dysmotility disorder, small type I hiatal hernia.  Patient states that she has been having continued difficulty swallowing including liquids and pills.     Soft tissue neck x-ray was negative for radiopaque foreign body.  Cervical airway unremarkable.  No evidence of retropharyngeal soft tissue swelling or epiglottic enlargement.   Discharge Diagnoses:  Principal Problem:   Dysphagia Active Problems:   Hyponatremia   Hypokalemia   Hypothyroidism   Pacemaker-dual chamber    Chronic kidney disease, stage 3b (HCC)   Permanent atrial fibrillation (HCC)   GAD (generalized anxiety disorder)   (HFpEF) heart failure with preserved ejection fraction (HCC)   Mild asthma  Dysphagia: Patient admitted with complains of dysphagia with solids liquids and pills.  She reports  weight loss unclear exactly how much.  This has been going on for a while and is being followed by GI.  Patient was treated with Diflucan with improvement in her symptoms.  She tolerated a soft diet prior to discharge.  Educated on oral hygiene after Dulera use.  Hypovolemic hyponatremia: Sodium improved to 133 from 126 from 122 on admission with normal saline.    Hypokalemia: Resolved.  Potassium 3.9 on discharge.     CKD stage IIIb: Creatinine 0.85 from 1.03 at baseline   Permanent atrial fibrillation CHB s/p PPM: Continue Eliquis.  She takes Lasix as needed at home.  She was asked to continue as needed Lasix at home.  Asthma: Stable without active wheezing.  Continue Dulera.  She was educated on oral hygiene after use of Dulera.     Chronic HFpEF with severe TR: EF 55-60%.  Continue Lasix as needed as prior to admission.     Hypothyroidism: Change Synthroid    Anxiety: Continue Valium.   Estimated body mass index is 17.74 kg/m as calculated from the following:   Height as of this encounter: 5\' 6"  (1.676 m).   Weight as of this encounter: 49.9 kg.  Discharge Instructions  Discharge Instructions     Diet - low sodium heart healthy   Complete by: As directed    Increase activity slowly   Complete by: As directed       Allergies as of 09/26/2022       Reactions   Penicillins Diarrhea   Has IBS (caused diarrhea)        Medication List     TAKE these medications    acetaminophen 650 MG CR tablet Commonly known as: TYLENOL Take 1,300 mg by mouth 2 (  two) times daily.   albuterol 108 (90 Base) MCG/ACT inhaler Commonly known as: VENTOLIN HFA Inhale 2 puffs into the lungs every 6 (six) hours as needed.   BENEFIBER PO Take 1 Dose by mouth daily.   diazepam 2 MG tablet Commonly known as: Valium Take 1 tablet (2 mg total) by mouth every 8 (eight) hours as needed for anxiety.   Dulera 100-5 MCG/ACT Aero Generic drug: mometasone-formoterol Inhale 2 puffs into  the lungs 2 (two) times daily.   Eliquis 2.5 MG Tabs tablet Generic drug: apixaban TAKE 1 TABLET BY MOUTH TWICE A DAY   famotidine 40 MG tablet Commonly known as: Pepcid Take 1 tablet (40 mg total) by mouth at bedtime.   fluconazole 100 MG tablet Commonly known as: Diflucan Take 1 tablet (100 mg total) by mouth daily for 12 days.   furosemide 40 MG tablet Commonly known as: LASIX TAKE 1 TABLET BY MOUTH DAILY; MAY TAKE ADDITIONAL HALF TABLET AS NEEDED FOR WEIGHT GAIN   levothyroxine 50 MCG tablet Commonly known as: SYNTHROID Take 1 tablet (50 mcg total) by mouth daily before breakfast.   MISC NATURAL PRODUCTS EX Apply 1 application topically in the morning and at bedtime. CBD cream for back and ankle pain   omeprazole 40 MG capsule Commonly known as: PRILOSEC Take 1 capsule (40 mg total) by mouth daily.   SYSTANE COMPLETE OP Apply 1 drop to eye as needed (dryness).        Allergies  Allergen Reactions   Penicillins Diarrhea    Has IBS (caused diarrhea)    Consultations: GI ED physician discussed with ENT over the phone they did not have anything new to offer   Procedures/Studies: DG Chest 2 View  Result Date: 09/23/2022 CLINICAL DATA:  Chest pain EXAM: CHEST - 2 VIEW COMPARISON:  03/28/2021, CT 05/04/2016 FINDINGS: Left-sided pacing device as before. Cardiomegaly. Small bilateral pleural effusions. Aortic atherosclerosis. Bilateral breast implants. Clips in the right axilla. No radiopaque foreign body over the chest. IMPRESSION: Cardiomegaly with small bilateral pleural effusions. Electronically Signed   By: Jasmine Pang M.D.   On: 09/23/2022 19:25   DG Neck Soft Tissue  Result Date: 09/23/2022 CLINICAL DATA:  Medication stuck in throat EXAM: NECK SOFT TISSUES - 1+ VIEW COMPARISON:  None Available. FINDINGS: There is no evidence of retropharyngeal soft tissue swelling or epiglottic enlargement. The cervical airway is unremarkable and no radio-opaque foreign body  identified. IMPRESSION: Negative. Electronically Signed   By: Charline Bills M.D.   On: 09/23/2022 19:24   DG UGI W DOUBLE CM (HD BA)  Result Date: 09/20/2022 CLINICAL DATA:  Patient with complaints of dysphagia, globus sensation and intermittent pain/discomfort in the right side of her throat. Patient also complains of coughing while drinking. EXAM: UPPER GI SERIES WITH HIGH DENSITY WITHOUT KUB TECHNIQUE: Scout radiograph was obtained. Combined double and single contrast examination was performed using effervescent crystals, high-density barium and thin liquid barium. This exam was performed by Alwyn Ren, NP, and was supervised and interpreted by Dr. Ova Freshwater. FLUOROSCOPY: Radiation Exposure Index (as provided by the fluoroscopic device): 24.60 mGy Kerma COMPARISON:  DG swallow function 09/03/22 FINDINGS: Scout Radiograph: Normal bowel gas pattern. Pace maker wires visible. Esophagus: Normal appearance. Esophageal motility: Dysmotility with tertiary contractions. Disrupted primary peristalsis causing a delay in transit of barium through the esophagus most prominent while in the prone position. Gastroesophageal reflux: None visualized. Ingested 13mm barium tablet: Passed normally Stomach: Very small type 1 hiatal hernia. Gastric emptying: Normal. Duodenum:  Mild fold thickening along the apex of the duodenal bulb and proximal second portion of the duodenum suggesting duodenitis. No definite frank ulceration. There is a notable diverticulum of the fourth portion of the duodenum extending cephalad. Other:  Mild aspiration with rapid gulping; cough reflex triggered. IMPRESSION: 1. Tracheal aspiration with rapid swallowing, triggering a cough response. 2. Nonspecific esophageal dysmotility disorder. 3. Small type 1 hiatal hernia. 4. Suspected mild proximal duodenitis. 5. Incidental diverticulum of the fourth portion of the duodenum. Electronically Signed   By: Gaylyn Rong M.D.   On: 09/20/2022 14:51    DG SWALLOW FUNC OP MEDICARE SPEECH PATH  Result Date: 09/03/2022 Table formatting from the original result was not included. Modified Barium Swallow Study Patient Details Name: Kristinia Loveall MRN: [id number] Date of Birth: Aug 09, 1924 Today's Date: 09/03/2022 HPI/PMH: HPI: pt is a 89 yo female referred by GI NP for MBS. Pt has PMH + for anxiety, dysphagia -- concerning from thyroid mass, renal insufficiency, esophageal narrowing s/p dilatation, GERD, Afib, IBS, MVP, osteoporosis and h/o asthma.  She underwent upper gastrointenstinal endoscopy 09/06/2020.  Medication list includes Align, Synthroid.  Reports largest difficulty is with pills and liquids causing coughing, choking but also some diffiuclty with solids.  She reports globus - MBS ordered.  Last chest imaging showed Trace bilateral pleural effusions. Clinical Impression: Clinical Impression: Patient presents with functional oropharyngeal swallow based on her current age.  Swallow function characterized by anterior curvature of tip of epiglottis contributing to some trapping of barium.  Decreased laryngeal elevation and closure resulting in mild laryngeal penetration of thin liquid, to the vocal cords x 1 that cleared with cued throat clear.  Patient did not aspirate despite being taxed with sequential swallows of thin liquids.  Various compensation strategies attempted due to patient complaint of sensation of retention on the right side of her pharynx including head turn to the right,  head turn combined with chin tuck, chin tuck alone of which none were effective.  An anterior posterior view moderate tension appeared on right vallecular region which is where patient senses retention.  Of note patient did cough during evaluation without aspiration.  She was observed to sniff complaining of secretions in nasal region and pharynx.  Patient reports that she took omeprazole for 4 days only due to her suspicion it was exacerbating her asthma after it was  prescribed in July 2024.  She does endorse that it appeared to help her swallowing ability however.  Encouraged her to speak to her GI MD, NP about these issues.  Many of her dysphagia symptoms including hoarseness, difficulty swallowing, globus sensation may be consistent with LPR.  Educated patient and her friend thoroughly to findings following testing using fluoroscopy loops.  Advised patient look into purchasing antichoking device such as a life-Vac given her report of coughing and choking with intake, especially given her advanced age.  No follow-up SLP indicated.  Thank you very much for this referral. Factors that may increase risk of adverse event in presence of aspiration Rubye Oaks & Clearance Coots 2021): No data recorded Recommendations/Plan: Swallowing Evaluation Recommendations Swallowing Evaluation Recommendations Recommendations: PO diet PO Diet Recommendation: Regular; Thin liquids (Level 0) Liquid Administration via: Cup; Straw Medication Administration: Other (Comment) (As tolerated) Supervision: Patient able to self-feed Swallowing strategies  : Slow rate; Small bites/sips; Follow solids with liquids (Intermittent throat clear) Postural changes: Position pt fully upright for meals; Stay upright 30-60 min after meals Oral care recommendations: Oral care BID (2x/day) Treatment Plan No data recorded  Recommendations Recommendations for follow up therapy are one component of a multi-disciplinary discharge planning process, led by the attending physician.  Recommendations may be updated based on patient status, additional functional criteria and insurance authorization. Assessment: Orofacial Exam: Orofacial Exam Oral Cavity: Oral Hygiene: WFL Oral Cavity - Dentition: Adequate natural dentition Orofacial Anatomy: WFL Oral Motor/Sensory Function: WFL Anatomy: Anatomy: WFL; Other (Comment) (Anterior position of tip of epiglottis) Boluses Administered: Boluses Administered Boluses Administered: Thin liquids (Level  0); Mildly thick liquids (Level 2, nectar thick); Moderately thick liquids (Level 3, honey thick); Puree; Solid  Oral Impairment Domain: Oral Impairment Domain Lip Closure: No labial escape Tongue control during bolus hold: Cohesive bolus between tongue to palatal seal Bolus preparation/mastication: Timely and efficient chewing and mashing Bolus transport/lingual motion: Brisk tongue motion Oral residue: Trace residue lining oral structures Location of oral residue : Tongue Initiation of pharyngeal swallow : Pyriform sinuses  Pharyngeal Impairment Domain: Pharyngeal Impairment Domain Soft palate elevation: No bolus between soft palate (SP)/pharyngeal wall (PW) Laryngeal elevation: Partial superior movement of thyroid cartilage/partial approximation of arytenoids to epiglottic petiole Anterior hyoid excursion: Partial anterior movement Epiglottic movement: Partial inversion Laryngeal vestibule closure: Incomplete, narrow column air/contrast in laryngeal vestibule Pharyngeal stripping wave : Present - complete Pharyngeal contraction (A/P view only): Complete Pharyngoesophageal segment opening: Complete distension and complete duration, no obstruction of flow Tongue base retraction: Trace column of contrast or air between tongue base and PPW Pharyngeal residue: Trace residue within or on pharyngeal structures Location of pharyngeal residue: Valleculae; Pyriform sinuses  Esophageal Impairment Domain: Esophageal Impairment Domain Esophageal clearance upright position: Complete clearance, esophageal coating Pill: No data recorded Penetration/Aspiration Scale Score: Penetration/Aspiration Scale Score 1.  Material does not enter airway: Moderately thick liquids (Level 3, honey thick); Puree; Solid; Mildly thick liquids (Level 2, nectar thick) 5.  Material enters airway, CONTACTS cords and not ejected out: Thin liquids (Level 0) Compensatory Strategies: Compensatory Strategies Compensatory strategies: Yes Effortful swallow:  Ineffective Multiple swallows: Effective Effective Multiple Swallows: Thin liquid (Level 0); Mildly thick liquid (Level 2, nectar thick) Chin tuck: Ineffective Ineffective Chin Tuck: Puree; Solid Liquid wash: Effective Effective Liquid Wash: Puree; Solid Left head turn: Ineffective Ineffective Left Head Turn: Puree Right head turn: Ineffective Ineffective Right Head Turn: Puree Other(comment): -- (expectorate effective)   General Information: Caregiver present: -- (Friend present who drove patient for testing)  Diet Prior to this Study: Regular; Thin liquids (Level 0)   No data recorded  Respiratory Status: WFL   Supplemental O2: None (Room air)   History of Recent Intubation: No  Behavior/Cognition: Alert; Cooperative; Pleasant mood Self-Feeding Abilities: Able to self-feed Baseline vocal quality/speech: Dysphonic Volitional Cough: Able to elicit Volitional Swallow: Able to elicit Exam Limitations: No limitations Goal Planning: No data recorded No data recorded No data recorded No data recorded No data recorded Pain: No data recorded End of Session: Start Time:SLP Start Time (ACUTE ONLY): 1420 Stop Time: SLP Stop Time (ACUTE ONLY): 1459 Time Calculation:SLP Time Calculation (min) (ACUTE ONLY): 39 min Charges: SLP Evaluations $ SLP Speech Visit: 1 Visit SLP Evaluations $Outpatient MBS Swallow: 1 Procedure $Swallowing Treatment: 1 Procedure SLP visit diagnosis: SLP Visit Diagnosis: Dysphagia, unspecified (R13.10) Past Medical History: Past Medical History: Diagnosis Date  Anxiety   Arthritis   Atrial fibrillation (HCC)   pacemaker, chronic anticoag  Breast cancer (HCC)   Diverticulosis   Fibroma   inner lips/mouth  GERD (gastroesophageal reflux disease)   Hx of breast cancer   Hyperkalemia   Hypertension  Hypothyroidism   IBS (irritable bowel syndrome)   Internal hemorrhoids   Left inguinal hernia   direct  MVP (mitral valve prolapse)   Osteoporosis   Renal insufficiency   Status post dilation of esophageal  narrowing   Thyroid cancer [facility])  Past Surgical History: Past Surgical History: Procedure Laterality Date  COLONOSCOPY  10/28/2005  internal hemorrhoids, diverticulosis (same as in 2002 and random bxs negative then)  EP IMPLANTABLE DEVICE N/A 02/26/2015  Procedure: PPM Generator Changeout;  Surgeon: Duke Salvia, MD;  Location: [facility] INVASIVE CV LAB;  Service: Cardiovascular;  Laterality: N/A;  MASTECTOMY  1982  Bilateral  PACEMAKER INSERTION    THYROIDECTOMY    TONSILLECTOMY    UPPER GASTROINTESTINAL ENDOSCOPY  09/06/2000  normal Rolena Infante, MS [facility] SLP Acute Rehab Services Office [phone] Chales Abrahams 09/03/2022, 8:07 PM CLINICAL DATA:  Dysphagia. Cough/GE reflux disease/other secondary diagnosis EXAM: MODIFIED BARIUM SWALLOW TECHNIQUE: Different consistencies of barium were administered orally to the patient by the Speech Pathologist. Imaging of the pharynx was performed in the lateral projection. The radiologist was present in the fluoroscopy room for this study, providing personal supervision. FLUOROSCOPY: Radiation Exposure Index (as provided by the fluoroscopic device): 6.4 mGy Kerma COMPARISON:  None Available. FINDINGS: Vestibular  Penetration:  Minimal flash penetration Aspiration:  None seen. Other:  Mild vallecular pooling. IMPRESSION: No aspiration.  See speech pathology report. Please refer to the Speech Pathologists report for complete details and recommendations. Electronically Signed   By: Genevive Bi M.D.   On: 09/03/2022 15:26  (Echo, Carotid, EGD, Colonoscopy, ERCP)    Subjective: Patient feeling better tolerating a soft diet anxious to go home  Discharge Exam: Vitals:   09/26/22 0529 09/26/22 0808  BP: (!) 159/77   Pulse: 65   Resp: 16   Temp: (!) 97.4 F (36.3 C)   SpO2: 97% 96%   Vitals:   09/25/22 2002 09/25/22 2044 09/26/22 0529 09/26/22 0808  BP:  (!) 153/78 (!) 159/77   Pulse:  70 65   Resp:  16 16   Temp:  98.1 F (36.7 C) (!) 97.4 F (36.3 C)   TempSrc:   Oral Oral   SpO2: 98% 96% 97% 96%  Weight:      Height:        General: Pt is alert, awake, not in acute distress Cardiovascular: RRR, S1/S2 +, no rubs, no gallops Respiratory: CTA bilaterally, no wheezing, no rhonchi Abdominal: Soft, NT, ND, bowel sounds + Extremities: no edema, no cyanosis    The results of significant diagnostics from this hospitalization (including imaging, microbiology, ancillary and laboratory) are listed below for reference.     Microbiology: No results found for this or any previous visit (from the past 240 hour(s)).   Labs: BNP (last 3 results) No results for input(s): "BNP" in the last 8760 hours. Basic Metabolic Panel: Recent Labs  Lab 09/23/22 1815 09/23/22 2010 09/24/22 0536 09/24/22 1121 09/25/22 0658  NA 122*  --  126*  --  133*  K 2.7*  --  3.4*  --  3.9  CL 84*  --  90*  --  101  CO2 25  --  25  --  23  GLUCOSE 93  --  95  --  84  BUN 18  --  13  --  10  CREATININE 1.03*  --  0.85  --  0.95  CALCIUM 8.0*  --  8.3*  --  8.1*  MG  --  1.9  --  1.9 2.1   Liver Function Tests: Recent Labs  Lab 09/23/22 1815 09/25/22 0658  AST 51* 46*  ALT 32 29  ALKPHOS 53 51  BILITOT 0.9 1.0  PROT 5.8* 5.2*  ALBUMIN 3.9 3.3*   Recent Labs  Lab 09/23/22 1815  LIPASE 46   No results for input(s): "AMMONIA" in the last 168 hours. CBC: Recent Labs  Lab 09/23/22 1815 09/24/22 0536 09/25/22 0658  WBC 5.3 4.6 3.8*  NEUTROABS 3.6  --   --   HGB 11.6* 11.8* 12.0  HCT 33.9* 35.5* 35.2*  MCV 93.4 95.7 95.4  PLT 171 163 154   Cardiac Enzymes: No results for input(s): "CKTOTAL", "CKMB", "CKMBINDEX", "TROPONINI" in the last 168 hours. BNP: Invalid input(s): "POCBNP" CBG: No results for input(s): "GLUCAP" in the last 168 hours. D-Dimer No results for input(s): "DDIMER" in the last 72 hours. Hgb A1c No results for input(s): "HGBA1C" in the last 72 hours. Lipid Profile No results for input(s): "CHOL", "HDL", "LDLCALC", "TRIG", "CHOLHDL",  "LDLDIRECT" in the last 72 hours. Thyroid function studies No results for input(s): "TSH", "T4TOTAL", "T3FREE", "THYROIDAB" in the last 72 hours.  Invalid input(s): "FREET3" Anemia work up No results for input(s): "VITAMINB12", "FOLATE", "FERRITIN", "TIBC", "IRON", "RETICCTPCT" in the last 72 hours. Urinalysis    Component Value Date/Time   COLORURINE COLORLESS (A) 09/23/2022 2359   APPEARANCEUR CLEAR 09/23/2022 2359   LABSPEC 1.002 (L) 09/23/2022 2359   PHURINE 8.0 09/23/2022 2359   GLUCOSEU NEGATIVE 09/23/2022 2359   GLUCOSEU NEGATIVE 11/13/2019 1403   HGBUR NEGATIVE 09/23/2022 2359   BILIRUBINUR NEGATIVE 09/23/2022 2359   BILIRUBINUR negative 10/31/2020 1655   KETONESUR NEGATIVE 09/23/2022 2359   PROTEINUR NEGATIVE 09/23/2022 2359   UROBILINOGEN 0.2 10/31/2020 1655   UROBILINOGEN 0.2 11/13/2019 1403   NITRITE NEGATIVE 09/23/2022 2359   LEUKOCYTESUR TRACE (A) 09/23/2022 2359   Sepsis Labs Recent Labs  Lab 09/23/22 1815 09/24/22 0536 09/25/22 0658  WBC 5.3 4.6 3.8*   Microbiology No results found for this or any previous visit (from the past 240 hour(s)).   Time coordinating discharge: 35  minutes  SIGNED: Alwyn Ren, MD  [facility] 09/26/2022, 4:26 PM

## 2022-09-27 ENCOUNTER — Telehealth: Payer: Self-pay

## 2022-09-27 ENCOUNTER — Encounter: Payer: Self-pay | Admitting: Internal Medicine

## 2022-09-27 NOTE — Transitions of Care (Post Inpatient/ED Visit) (Unsigned)
   09/27/2022  Name: Alyssa [name] MRN: [id number] DOB: 10-26-1924  Today's TOC FU Call Status: Today's TOC FU Call Status:: Unsuccessful Call (1st Attempt) Unsuccessful Call (1st Attempt) Date: 09/27/22  Attempted to reach the patient regarding the most recent Inpatient/ED visit.  Follow Up Plan: Additional outreach attempts will be made to reach the patient to complete the Transitions of Care (Post Inpatient/ED visit) call.   Signature   Woodfin Ganja LPN [facility] Nurse Health Advisor Direct Dial [phone]

## 2022-09-28 NOTE — Transitions of Care (Post Inpatient/ED Visit) (Unsigned)
   09/28/2022  Name: Alyssa [name] MRN: [id number] DOB: [birth year]  Today's TOC FU Call Status: Today's TOC FU Call Status:: Unsuccessful Call (2nd Attempt) Unsuccessful Call (1st Attempt) Date: 09/27/22 Unsuccessful Call (2nd Attempt) Date: 09/28/22  Attempted to reach the patient regarding the most recent Inpatient/ED visit.  Follow Up Plan: Additional outreach attempts will be made to reach the patient to complete the Transitions of Care (Post Inpatient/ED visit) call.   Signature   Woodfin Ganja LPN [facility] Nurse Health Advisor Direct Dial [phone]

## 2022-09-29 NOTE — Transitions of Care (Post Inpatient/ED Visit) (Signed)
   09/29/2022  Name: Alyssa [name] MRN: [id number] DOB: 1924-07-14  Today's TOC FU Call Status: Today's TOC FU Call Status:: Unsuccessful Call (3rd Attempt) Unsuccessful Call (1st Attempt) Date: 09/27/22 Unsuccessful Call (2nd Attempt) Date: 09/28/22 Unsuccessful Call (3rd Attempt) Date: 09/29/22 [facility] FU Call Complete Date: 09/29/22  Attempted to reach the patient regarding the most recent Inpatient/ED visit.  Follow Up Plan: No further outreach attempts will be made at this time. We have been unable to contact the patient.  Signature   Woodfin Ganja LPN [facility] Nurse Health Advisor Direct Dial [phone]

## 2022-09-30 ENCOUNTER — Emergency Department (HOSPITAL_COMMUNITY): Payer: Medicare Other

## 2022-09-30 ENCOUNTER — Encounter (HOSPITAL_COMMUNITY): Payer: Self-pay

## 2022-09-30 ENCOUNTER — Encounter: Payer: Self-pay | Admitting: Internal Medicine

## 2022-09-30 ENCOUNTER — Other Ambulatory Visit: Payer: Self-pay

## 2022-09-30 ENCOUNTER — Inpatient Hospital Stay (HOSPITAL_COMMUNITY)
Admission: EM | Admit: 2022-09-30 | Discharge: 2022-10-06 | DRG: 641 | Disposition: A | Discharge status: Home Health | Payer: Medicare Other | Attending: Internal Medicine | Admitting: Internal Medicine

## 2022-09-30 DIAGNOSIS — R109 Unspecified abdominal pain: Secondary | ICD-10-CM | POA: Diagnosis not present

## 2022-09-30 DIAGNOSIS — I890 Lymphedema, not elsewhere classified: Secondary | ICD-10-CM | POA: Diagnosis present

## 2022-09-30 DIAGNOSIS — Z87891 Personal history of nicotine dependence: Secondary | ICD-10-CM

## 2022-09-30 DIAGNOSIS — Z950 Presence of cardiac pacemaker: Secondary | ICD-10-CM | POA: Diagnosis not present

## 2022-09-30 DIAGNOSIS — Z681 Body mass index (BMI) 19 or less, adult: Secondary | ICD-10-CM

## 2022-09-30 DIAGNOSIS — R4182 Altered mental status, unspecified: Secondary | ICD-10-CM | POA: Diagnosis not present

## 2022-09-30 DIAGNOSIS — N1832 Chronic kidney disease, stage 3b: Secondary | ICD-10-CM | POA: Diagnosis not present

## 2022-09-30 DIAGNOSIS — E890 Postprocedural hypothyroidism: Secondary | ICD-10-CM | POA: Diagnosis present

## 2022-09-30 DIAGNOSIS — Z823 Family history of stroke: Secondary | ICD-10-CM

## 2022-09-30 DIAGNOSIS — I4821 Permanent atrial fibrillation: Secondary | ICD-10-CM | POA: Diagnosis not present

## 2022-09-30 DIAGNOSIS — Z85850 Personal history of malignant neoplasm of thyroid: Secondary | ICD-10-CM | POA: Diagnosis not present

## 2022-09-30 DIAGNOSIS — L03116 Cellulitis of left lower limb: Secondary | ICD-10-CM | POA: Diagnosis not present

## 2022-09-30 DIAGNOSIS — Z79899 Other long term (current) drug therapy: Secondary | ICD-10-CM | POA: Diagnosis not present

## 2022-09-30 DIAGNOSIS — Z66 Do not resuscitate: Secondary | ICD-10-CM | POA: Diagnosis not present

## 2022-09-30 DIAGNOSIS — R9431 Abnormal electrocardiogram [ECG] [EKG]: Secondary | ICD-10-CM | POA: Diagnosis not present

## 2022-09-30 DIAGNOSIS — I10 Essential (primary) hypertension: Secondary | ICD-10-CM | POA: Diagnosis present

## 2022-09-30 DIAGNOSIS — I517 Cardiomegaly: Secondary | ICD-10-CM | POA: Diagnosis not present

## 2022-09-30 DIAGNOSIS — S199XXA Unspecified injury of neck, initial encounter: Secondary | ICD-10-CM | POA: Diagnosis not present

## 2022-09-30 DIAGNOSIS — I6782 Cerebral ischemia: Secondary | ICD-10-CM | POA: Diagnosis not present

## 2022-09-30 DIAGNOSIS — Z79890 Hormone replacement therapy (postmenopausal): Secondary | ICD-10-CM

## 2022-09-30 DIAGNOSIS — Z801 Family history of malignant neoplasm of trachea, bronchus and lung: Secondary | ICD-10-CM

## 2022-09-30 DIAGNOSIS — E861 Hypovolemia: Secondary | ICD-10-CM | POA: Diagnosis not present

## 2022-09-30 DIAGNOSIS — R918 Other nonspecific abnormal finding of lung field: Secondary | ICD-10-CM | POA: Diagnosis not present

## 2022-09-30 DIAGNOSIS — Z9013 Acquired absence of bilateral breasts and nipples: Secondary | ICD-10-CM

## 2022-09-30 DIAGNOSIS — W1839XA Other fall on same level, initial encounter: Secondary | ICD-10-CM | POA: Diagnosis present

## 2022-09-30 DIAGNOSIS — E871 Hypo-osmolality and hyponatremia: Secondary | ICD-10-CM | POA: Diagnosis present

## 2022-09-30 DIAGNOSIS — J4520 Mild intermittent asthma, uncomplicated: Secondary | ICD-10-CM | POA: Diagnosis not present

## 2022-09-30 DIAGNOSIS — Z7901 Long term (current) use of anticoagulants: Secondary | ICD-10-CM

## 2022-09-30 DIAGNOSIS — I129 Hypertensive chronic kidney disease with stage 1 through stage 4 chronic kidney disease, or unspecified chronic kidney disease: Secondary | ICD-10-CM | POA: Diagnosis present

## 2022-09-30 DIAGNOSIS — Y92002 Bathroom of unspecified non-institutional (private) residence single-family (private) house as the place of occurrence of the external cause: Secondary | ICD-10-CM | POA: Diagnosis not present

## 2022-09-30 DIAGNOSIS — E876 Hypokalemia: Secondary | ICD-10-CM | POA: Diagnosis present

## 2022-09-30 DIAGNOSIS — E878 Other disorders of electrolyte and fluid balance, not elsewhere classified: Secondary | ICD-10-CM | POA: Diagnosis present

## 2022-09-30 DIAGNOSIS — K5730 Diverticulosis of large intestine without perforation or abscess without bleeding: Secondary | ICD-10-CM | POA: Diagnosis not present

## 2022-09-30 DIAGNOSIS — Z8249 Family history of ischemic heart disease and other diseases of the circulatory system: Secondary | ICD-10-CM | POA: Diagnosis not present

## 2022-09-30 DIAGNOSIS — I5032 Chronic diastolic (congestive) heart failure: Secondary | ICD-10-CM

## 2022-09-30 DIAGNOSIS — S0990XA Unspecified injury of head, initial encounter: Secondary | ICD-10-CM | POA: Diagnosis not present

## 2022-09-30 DIAGNOSIS — Z853 Personal history of malignant neoplasm of breast: Secondary | ICD-10-CM

## 2022-09-30 DIAGNOSIS — M810 Age-related osteoporosis without current pathological fracture: Secondary | ICD-10-CM | POA: Diagnosis present

## 2022-09-30 DIAGNOSIS — R9089 Other abnormal findings on diagnostic imaging of central nervous system: Secondary | ICD-10-CM | POA: Diagnosis not present

## 2022-09-30 DIAGNOSIS — E039 Hypothyroidism, unspecified: Secondary | ICD-10-CM | POA: Diagnosis present

## 2022-09-30 DIAGNOSIS — R636 Underweight: Secondary | ICD-10-CM | POA: Diagnosis present

## 2022-09-30 DIAGNOSIS — K219 Gastro-esophageal reflux disease without esophagitis: Secondary | ICD-10-CM | POA: Diagnosis present

## 2022-09-30 DIAGNOSIS — W19XXXA Unspecified fall, initial encounter: Principal | ICD-10-CM

## 2022-09-30 DIAGNOSIS — Z800 Family history of malignant neoplasm of digestive organs: Secondary | ICD-10-CM

## 2022-09-30 DIAGNOSIS — L03115 Cellulitis of right lower limb: Secondary | ICD-10-CM | POA: Diagnosis not present

## 2022-09-30 DIAGNOSIS — J45909 Unspecified asthma, uncomplicated: Secondary | ICD-10-CM | POA: Diagnosis present

## 2022-09-30 DIAGNOSIS — Z7951 Long term (current) use of inhaled steroids: Secondary | ICD-10-CM

## 2022-09-30 DIAGNOSIS — I5033 Acute on chronic diastolic (congestive) heart failure: Secondary | ICD-10-CM

## 2022-09-30 DIAGNOSIS — I878 Other specified disorders of veins: Secondary | ICD-10-CM | POA: Diagnosis present

## 2022-09-30 LAB — URINALYSIS, ROUTINE W REFLEX MICROSCOPIC
Bilirubin Urine: NEGATIVE
Glucose, UA: NEGATIVE mg/dL
Ketones, ur: NEGATIVE mg/dL
Nitrite: NEGATIVE
Protein, ur: NEGATIVE mg/dL
Specific Gravity, Urine: 1.002 — ABNORMAL LOW (ref 1.005–1.030)
pH: 7 (ref 5.0–8.0)

## 2022-09-30 LAB — CBC WITH DIFFERENTIAL/PLATELET
Abs Immature Granulocytes: 0.04 10*3/uL (ref 0.00–0.07)
Basophils Absolute: 0 10*3/uL (ref 0.0–0.1)
Basophils Relative: 0 %
Eosinophils Absolute: 0 10*3/uL (ref 0.0–0.5)
Eosinophils Relative: 0 %
HCT: 33.9 % — ABNORMAL LOW (ref 36.0–46.0)
Hemoglobin: 11.8 g/dL — ABNORMAL LOW (ref 12.0–15.0)
Immature Granulocytes: 1 %
Lymphocytes Relative: 10 %
Lymphs Abs: 0.7 10*3/uL (ref 0.7–4.0)
MCH: 32.6 pg (ref 26.0–34.0)
MCHC: 34.8 g/dL (ref 30.0–36.0)
MCV: 93.6 fL (ref 80.0–100.0)
Monocytes Absolute: 0.5 10*3/uL (ref 0.1–1.0)
Monocytes Relative: 7 %
Neutro Abs: 6.3 10*3/uL (ref 1.7–7.7)
Neutrophils Relative %: 82 %
Platelets: 164 10*3/uL (ref 150–400)
RBC: 3.62 MIL/uL — ABNORMAL LOW (ref 3.87–5.11)
RDW: 11.5 % (ref 11.5–15.5)
WBC: 7.5 10*3/uL (ref 4.0–10.5)
nRBC: 0 % (ref 0.0–0.2)

## 2022-09-30 LAB — BASIC METABOLIC PANEL
Anion gap: 11 (ref 5–15)
BUN: 13 mg/dL (ref 8–23)
CO2: 21 mmol/L — ABNORMAL LOW (ref 22–32)
Calcium: 8.3 mg/dL — ABNORMAL LOW (ref 8.9–10.3)
Chloride: 89 mmol/L — ABNORMAL LOW (ref 98–111)
Creatinine, Ser: 0.9 mg/dL (ref 0.44–1.00)
GFR, Estimated: 59 mL/min — ABNORMAL LOW (ref >60)
Glucose, Bld: 114 mg/dL — ABNORMAL HIGH (ref 70–99)
Potassium: 3.6 mmol/L (ref 3.5–5.1)
Sodium: 121 mmol/L — ABNORMAL LOW (ref 135–145)

## 2022-09-30 LAB — SODIUM, URINE, RANDOM: Sodium, Ur: 62 mmol/L

## 2022-09-30 LAB — HEPATIC FUNCTION PANEL
ALT: 37 U/L (ref 0–44)
AST: 50 U/L — ABNORMAL HIGH (ref 15–41)
Albumin: 3.3 g/dL — ABNORMAL LOW (ref 3.5–5.0)
Alkaline Phosphatase: 53 U/L (ref 38–126)
Bilirubin, Direct: 0.3 mg/dL — ABNORMAL HIGH (ref 0.0–0.2)
Indirect Bilirubin: 0.7 mg/dL (ref 0.3–0.9)
Total Bilirubin: 1 mg/dL (ref 0.3–1.2)
Total Protein: 5.4 g/dL — ABNORMAL LOW (ref 6.5–8.1)

## 2022-09-30 LAB — CBG MONITORING, ED: Glucose-Capillary: 126 mg/dL — ABNORMAL HIGH (ref 70–99)

## 2022-09-30 MED ORDER — LEVOTHYROXINE SODIUM 50 MCG PO TABS
50.0000 ug | ORAL_TABLET | Freq: Every day | ORAL | Status: DC
  Administered 2022-10-01 – 2022-10-06 (×6): 50 ug via ORAL
  Filled 2022-09-30 (×6): qty 1

## 2022-09-30 MED ORDER — ONDANSETRON HCL 4 MG/2ML IJ SOLN: 4.0000 mg | Freq: Four times a day (QID) | INTRAMUSCULAR | Status: DC | PRN

## 2022-09-30 MED ORDER — MOMETASONE FURO-FORMOTEROL FUM 100-5 MCG/ACT IN AERO
2.0000 | INHALATION_SPRAY | Freq: Two times a day (BID) | RESPIRATORY_TRACT | Status: DC
  Administered 2022-10-01 – 2022-10-06 (×11): 2 via RESPIRATORY_TRACT
  Filled 2022-09-30: qty 8.8

## 2022-09-30 MED ORDER — SODIUM CHLORIDE 1 G PO TABS
1.0000 g | ORAL_TABLET | Freq: Once | ORAL | Status: AC
  Administered 2022-09-30: 1 g via ORAL
  Filled 2022-09-30: qty 1

## 2022-09-30 MED ORDER — ONDANSETRON HCL 4 MG PO TABS: 4.0000 mg | ORAL_TABLET | Freq: Four times a day (QID) | ORAL | Status: DC | PRN

## 2022-09-30 MED ORDER — FLUCONAZOLE 100 MG PO TABS
100.0000 mg | ORAL_TABLET | Freq: Every day | ORAL | Status: DC
  Administered 2022-10-01 – 2022-10-06 (×6): 100 mg via ORAL
  Filled 2022-09-30 (×6): qty 1

## 2022-09-30 MED ORDER — ACETAMINOPHEN 325 MG PO TABS
650.0000 mg | ORAL_TABLET | Freq: Four times a day (QID) | ORAL | Status: DC | PRN
  Administered 2022-09-30 – 2022-10-03 (×3): 650 mg via ORAL
  Filled 2022-09-30 (×3): qty 2

## 2022-09-30 MED ORDER — PANTOPRAZOLE SODIUM 40 MG PO TBEC
40.0000 mg | DELAYED_RELEASE_TABLET | Freq: Every day | ORAL | Status: DC
  Administered 2022-10-01 – 2022-10-06 (×6): 40 mg via ORAL
  Filled 2022-09-30 (×6): qty 1

## 2022-09-30 MED ORDER — DIAZEPAM 2 MG PO TABS
2.0000 mg | ORAL_TABLET | Freq: Three times a day (TID) | ORAL | Status: DC | PRN
  Administered 2022-09-30 – 2022-10-04 (×4): 2 mg via ORAL
  Filled 2022-09-30 (×5): qty 1

## 2022-09-30 MED ORDER — ALBUTEROL SULFATE (2.5 MG/3ML) 0.083% IN NEBU: 3.0000 mL | INHALATION_SOLUTION | Freq: Four times a day (QID) | RESPIRATORY_TRACT | Status: DC | PRN

## 2022-09-30 MED ORDER — SODIUM CHLORIDE 0.9 % IV SOLN
1.0000 g | INTRAVENOUS | Status: DC
  Administered 2022-10-01 – 2022-10-06 (×5): 1 g via INTRAVENOUS
  Filled 2022-09-30 (×6): qty 10

## 2022-09-30 MED ORDER — APIXABAN 2.5 MG PO TABS
2.5000 mg | ORAL_TABLET | Freq: Two times a day (BID) | ORAL | Status: DC
  Administered 2022-09-30 – 2022-10-06 (×12): 2.5 mg via ORAL
  Filled 2022-09-30 (×12): qty 1

## 2022-09-30 MED ORDER — SODIUM CHLORIDE 0.9 % IV BOLUS
500.0000 mL | Freq: Once | INTRAVENOUS | Status: AC
  Administered 2022-09-30: 500 mL via INTRAVENOUS

## 2022-09-30 MED ORDER — SODIUM CHLORIDE 0.9 % IV SOLN
1.0000 g | Freq: Once | INTRAVENOUS | Status: AC
  Administered 2022-09-30: 1 g via INTRAVENOUS
  Filled 2022-09-30: qty 10

## 2022-09-30 MED ORDER — SODIUM CHLORIDE 0.9 % IV SOLN: INTRAVENOUS | Status: DC

## 2022-09-30 MED ORDER — ACETAMINOPHEN 650 MG RE SUPP: 650.0000 mg | Freq: Four times a day (QID) | RECTAL | Status: DC | PRN

## 2022-09-30 NOTE — Plan of Care (Signed)
?  Problem: Education: ?Goal: Knowledge of General Education information will improve ?Description: Including pain rating scale, medication(s)/side effects and non-pharmacologic comfort measures ?Outcome: Progressing ?  ?Problem: Health Behavior/Discharge Planning: ?Goal: Ability to manage health-related needs will improve ?Outcome: Progressing ?  ?Problem: Clinical Measurements: ?Goal: Ability to maintain clinical measurements within normal limits will improve ?Outcome: Progressing ?  ?Problem: Activity: ?Goal: Risk for activity intolerance will decrease ?Outcome: Progressing ?  ?Problem: Coping: ?Goal: Level of anxiety will decrease ?Outcome: Progressing ?  ?Problem: Pain Managment: ?Goal: General experience of comfort will improve ?Outcome: Progressing ?  ?

## 2022-09-30 NOTE — ED Provider Notes (Signed)
[place] EMERGENCY DEPARTMENT AT [facility] Provider Note   CSN: [id number] Arrival date & time: 09/30/22  1049     History  Chief Complaint  Patient presents with   Alyssa [name] is a 89 y.o. female.  HPI 89 yo female from home.  She texted neighbor today at 19 and told her she had fallen during night.  Patient stated she went to bathroom about 3 times and she had fallen during night going to bathroom.  Unclear why she fell.   Hit head when she fell, pain in low back. Patient took eliquis this am.     Home Medications Prior to Admission medications   Medication Sig Start Date End Date Taking? Authorizing Provider  acetaminophen (TYLENOL) 650 MG CR tablet Take 1,300 mg by mouth 2 (two) times daily.   Yes [provider]  albuterol (VENTOLIN HFA) 108 (90 Base) MCG/ACT inhaler Inhale 2 puffs into the lungs every 6 (six) hours as needed. 08/03/22  Yes Charlott Holler, MD  diazepam (VALIUM) 2 MG tablet Take 1 tablet (2 mg total) by mouth every 8 (eight) hours as needed for anxiety. 07/09/22  Yes [name], MD  ELIQUIS 2.5 MG TABS tablet TAKE 1 TABLET BY MOUTH TWICE A DAY 05/31/22  Yes Duke Salvia, MD  fluconazole (DIFLUCAN) 100 MG tablet Take 1 tablet (100 mg total) by mouth daily for 12 days. 09/26/22 10/08/22 Yes Alwyn Ren, MD  furosemide (LASIX) 40 MG tablet TAKE 1 TABLET BY MOUTH DAILY; MAY TAKE ADDITIONAL HALF TABLET AS NEEDED FOR WEIGHT GAIN Patient taking differently: Take 40 mg by mouth daily. 09/09/22  Yes [name], MD  levothyroxine (SYNTHROID) 50 MCG tablet Take 1 tablet (50 mcg total) by mouth daily before breakfast. 07/08/22  Yes [name], MD  mometasone-formoterol (DULERA) 100-5 MCG/ACT AERO Inhale 2 puffs into the lungs 2 (two) times daily.   Yes [provider]  omeprazole (PRILOSEC) 40 MG capsule Take 1 capsule (40 mg total) by mouth daily. 09/20/22  Yes McMichael, Bayley M, PA-C  Propylene Glycol  (SYSTANE COMPLETE OP) Apply 1 drop to eye as needed (dryness for right eye).   Yes [provider]  Wheat Dextrin (BENEFIBER PO) Take 1 Dose by mouth daily.   Yes [provider]  famotidine (PEPCID) 40 MG tablet Take 1 tablet (40 mg total) by mouth at bedtime. Patient not taking: Reported on 09/30/2022 09/14/22   Legrand Como, PA-C  MISC NATURAL PRODUCTS EX Apply 1 application topically in the morning and at bedtime. CBD cream for back and ankle pain    [provider]      Allergies    Penicillins    Review of Systems   Review of Systems  Physical Exam Updated Vital Signs BP (!) 143/60 Comment: nurse notified  Pulse 61   Temp 98 F (36.7 C) (Oral)   Resp 17   Ht 1.676 m (5\' 6" )   Wt 46.7 kg   SpO2 95%   BMI 16.62 kg/m  Physical Exam Vitals reviewed.  HENT:     Head: Normocephalic.     Right Ear: External ear normal.     Left Ear: External ear normal.     Nose: Nose normal.     Mouth/Throat:     Pharynx: Oropharynx is clear.  Eyes:     Pupils: Pupils are equal, round, and reactive to light.  Cardiovascular:  Rate and Rhythm: Normal rate.     Pulses: Normal pulses.     Heart sounds: Normal heart sounds.  Pulmonary:     Effort: Pulmonary effort is normal.     Breath sounds: Normal breath sounds.  Abdominal:     General: Abdomen is flat.     Palpations: Abdomen is soft.  Musculoskeletal:        General: Normal range of motion.     Cervical back: Normal range of motion.     Comments: Erythema and redness of left lower extremity  Skin:    General: Skin is warm and dry.     Capillary Refill: Capillary refill takes less than 2 seconds.  Neurological:     General: No focal deficit present.     Mental Status: She is alert.  Psychiatric:        Mood and Affect: Mood normal.     ED Results / Procedures / Treatments   Labs (all labs ordered are listed, but only abnormal results are displayed) Labs Reviewed  BASIC METABOLIC PANEL  - Abnormal; Notable for the following components:      Result Value   Sodium 121 (*)    Chloride 89 (*)    CO2 21 (*)    Glucose, Bld 114 (*)    Calcium 8.3 (*)    GFR, Estimated 59 (*)    All other components within normal limits  CBC WITH DIFFERENTIAL/PLATELET - Abnormal; Notable for the following components:   RBC 3.62 (*)    Hemoglobin 11.8 (*)    HCT 33.9 (*)    All other components within normal limits  URINALYSIS, ROUTINE W REFLEX MICROSCOPIC - Abnormal; Notable for the following components:   Color, Urine STRAW (*)    Specific Gravity, Urine 1.002 (*)    Hgb urine dipstick SMALL (*)    Leukocytes,Ua TRACE (*)    Bacteria, UA RARE (*)    All other components within normal limits  CBG MONITORING, ED - Abnormal; Notable for the following components:   Glucose-Capillary 126 (*)    All other components within normal limits  CULTURE, BLOOD (ROUTINE X 2)  CULTURE, BLOOD (ROUTINE X 2)  I-STAT CG4 LACTIC ACID, ED  I-STAT CG4 LACTIC ACID, ED    EKG EKG Interpretation Date/Time:  Thursday September 30 2022 12:32:22 EDT Ventricular Rate:  61 PR Interval:  232 QRS Duration:  166 QT Interval:  455 QTC Calculation: 459 R Axis:   -85  Text Interpretation: Ventricular-paced complexes No further analysis attempted due to paced rhythm Confirmed by Margarita Grizzle [phone]) on 09/30/2022 2:25:57 PM  Radiology CT Head Wo Contrast  Result Date: 09/30/2022 CLINICAL DATA:  Head trauma, minor (Age >= 65y); Neck trauma (Age >= 65y) EXAM: CT HEAD WITHOUT CONTRAST CT CERVICAL SPINE WITHOUT CONTRAST TECHNIQUE: Multidetector CT imaging of the head and cervical spine was performed following the standard protocol without intravenous contrast. Multiplanar CT image reconstructions of the cervical spine were also generated. RADIATION DOSE REDUCTION: This exam was performed according to the departmental dose-optimization program which includes automated exposure control, adjustment of the mA and/or kV  according to patient size and/or use of iterative reconstruction technique. COMPARISON:  None Available. FINDINGS: CT HEAD FINDINGS Brain: No evidence of acute infarction, hemorrhage, hydrocephalus, extra-axial collection or mass lesion/mass effect. Sequela of moderate chronic microvascular ischemic change. Generalized volume loss. Vascular: No hyperdense vessel or unexpected calcification. Skull: Normal. Negative for fracture or focal lesion. Sinuses/Orbits: No middle ear or mastoid effusion.  Paranasal sinuses are clear. Bilateral lens replacement. Orbits are otherwise unremarkable. Other: None. CT CERVICAL SPINE FINDINGS Alignment: Trace anterolisthesis of C3 on C4 and C4 on C5. Skull base and vertebrae: No acute fracture. No primary bone lesion or focal pathologic process. Soft tissues and spinal canal: No prevertebral fluid or swelling. No visible canal hematoma. Disc levels:  No evidence of high-grade spinal canal stenosis. Upper chest: There is nonspecific soft tissue density mass of the right lung apex (series 3, image 14). Other: None IMPRESSION: 1. No acute intracranial abnormality. 2. No acute fracture or traumatic subluxation of the cervical spine. 3. Nonspecific soft tissue density at the right lung apex. This is incompletely assessed due to respiratory motion artifact. Consider further evaluation with a chest CT. Electronically Signed   By: Lorenza Cambridge M.D.   On: 09/30/2022 13:44   CT Cervical Spine Wo Contrast  Result Date: 09/30/2022 CLINICAL DATA:  Head trauma, minor (Age >= 65y); Neck trauma (Age >= 65y) EXAM: CT HEAD WITHOUT CONTRAST CT CERVICAL SPINE WITHOUT CONTRAST TECHNIQUE: Multidetector CT imaging of the head and cervical spine was performed following the standard protocol without intravenous contrast. Multiplanar CT image reconstructions of the cervical spine were also generated. RADIATION DOSE REDUCTION: This exam was performed according to the departmental dose-optimization program  which includes automated exposure control, adjustment of the mA and/or kV according to patient size and/or use of iterative reconstruction technique. COMPARISON:  None Available. FINDINGS: CT HEAD FINDINGS Brain: No evidence of acute infarction, hemorrhage, hydrocephalus, extra-axial collection or mass lesion/mass effect. Sequela of moderate chronic microvascular ischemic change. Generalized volume loss. Vascular: No hyperdense vessel or unexpected calcification. Skull: Normal. Negative for fracture or focal lesion. Sinuses/Orbits: No middle ear or mastoid effusion. Paranasal sinuses are clear. Bilateral lens replacement. Orbits are otherwise unremarkable. Other: None. CT CERVICAL SPINE FINDINGS Alignment: Trace anterolisthesis of C3 on C4 and C4 on C5. Skull base and vertebrae: No acute fracture. No primary bone lesion or focal pathologic process. Soft tissues and spinal canal: No prevertebral fluid or swelling. No visible canal hematoma. Disc levels:  No evidence of high-grade spinal canal stenosis. Upper chest: There is nonspecific soft tissue density mass of the right lung apex (series 3, image 14). Other: None IMPRESSION: 1. No acute intracranial abnormality. 2. No acute fracture or traumatic subluxation of the cervical spine. 3. Nonspecific soft tissue density at the right lung apex. This is incompletely assessed due to respiratory motion artifact. Consider further evaluation with a chest CT. Electronically Signed   By: Lorenza Cambridge M.D.   On: 09/30/2022 13:44   CT ABDOMEN PELVIS WO CONTRAST  Result Date: 09/30/2022 CLINICAL DATA:  Fall at home last night. On anticoagulation. Abdominal and back pain. EXAM: CT ABDOMEN AND PELVIS WITHOUT CONTRAST TECHNIQUE: Multidetector CT imaging of the abdomen and pelvis was performed following the standard protocol without IV contrast. RADIATION DOSE REDUCTION: This exam was performed according to the departmental dose-optimization program which includes automated  exposure control, adjustment of the mA and/or kV according to patient size and/or use of iterative reconstruction technique. COMPARISON:  11/04/2021 FINDINGS: Lower chest: New small bilateral pleural effusions. Stable cardiomegaly and small pericardial effusion. Hepatobiliary: No hepatic parenchymal injury or mass identified on this noncontrast exam. Small cyst in the lateral segment of the left hepatic lobe remains stable. Gallbladder is unremarkable. No evidence of biliary ductal dilatation. Pancreas: No parenchymal abnormality identified on this noncontrast exam. Spleen: No evidence of parenchymal injury on this noncontrast exam. Adrenal/Urinary Tract: No  hemorrhage or parenchymal injury identified on this noncontrast exam. Stomach/Bowel: Diverticulosis is seen mainly involving the sigmoid colon, however there is no evidence of diverticulitis. No focal inflammatory process identified. Diffuse mesenteric and body wall edema new since previous study. Mild low-attenuation ascites is also new. Vascular/Lymphatic: No evidence of retroperitoneal hemorrhage. No pathologically enlarged lymph nodes identified. Reproductive: Several small calcified uterine fibroids are again noted largest measuring approximately 1 cm. Other: Small left inguinal hernia containing small amount of fluid remains stable. Musculoskeletal: No acute fractures or suspicious bone lesions identified. Old T12 vertebral body compression fracture is unchanged. IMPRESSION: No definite acute traumatic injury identified on this noncontrast exam. New anasarca with mild low-attenuation ascites. Colonic diverticulosis, without radiographic evidence of diverticulitis. Stable small calcified uterine fibroids. Stable small left inguinal hernia containing small amount of fluid. Stable cardiomegaly and small pericardial effusion. Electronically Signed   By: Danae Orleans M.D.   On: 09/30/2022 13:32   DG Chest Portable 1 View  Result Date: 09/30/2022 CLINICAL  DATA:  altered mental status.  Increased ankle swelling. EXAM: PORTABLE CHEST 1 VIEW COMPARISON:  09/23/2022. FINDINGS: Bilateral lung fields are clear. There is blunting of left lateral costophrenic angle, which may represent underlying trace pleural effusion. Right lateral costophrenic angle is clear. Stable cardio-mediastinal silhouette. No acute osseous abnormalities. There is a left sided 2-lead pacemaker. The soft tissues are within normal limits. Redemonstration of metallic staples along the right axillary region. IMPRESSION: Possible trace left pleural effusion. No acute cardiopulmonary abnormality. Electronically Signed   By: Jules Schick M.D.   On: 09/30/2022 11:53    Procedures .Critical Care  Performed by: Margarita Grizzle, MD Authorized by: Margarita Grizzle, MD       Medications Ordered in ED Medications  cefTRIAXone (ROCEPHIN) 1 g in sodium chloride 0.9 % 100 mL IVPB (1 g Intravenous New Bag/Given 09/30/22 1342)    ED Course/ Medical Decision Making/ A&P Clinical Course as of 09/30/22 1428  Thu Sep 30, 2022  1211 Sodium(!): 121 [DR]  1257 Chest x-Sevrin Sally reviewed interpreted noes acute abnormality noted [DR]  1408 TEE reviewed interpreted and no evidence of acute abnormality on CT of head or cervical spine They do report a nonspecific soft tissue density at the right lung apex [DR]  1409 CT abdomen and pelvis no definite acute traumatic injury [DR]    Clinical Course User Index [DR] Margarita Grizzle, MD                                 Medical Decision Making Amount and/or Complexity of Data Reviewed Labs: ordered. Decision-making details documented in ED Course. Radiology: ordered.   89 yo female presents with burning with urination, weakness, fall increased confusion over the past 2 days On physical evaluation, patient with redness and swelling of left lower extremity concerning for cellulitis Patient was evaluated with labs including CBC with stable anemia Metabolic panel  obtained which is significant for sodium of 121 which has decreased from 135 days ago Patient with some tenderness in her low back area and sacrum from fall CT head, cervical spine, abdomen and pelvis obtained  1 generalized weakness 2 hyponatremia 3 fall 4  cellulitis of left lower extremity-patient given Rocephin here        Final Clinical Impression(s) / ED Diagnoses Final diagnoses:  Fall, initial encounter  Hyponatremia    Rx / DC Orders ED Discharge Orders     None  Margarita Grizzle, MD 09/30/22 [phone]

## 2022-09-30 NOTE — ED Notes (Signed)
ED TO INPATIENT HANDOFF REPORT  ED Nurse Name and Phone #: Rebecca Eaton RN  S Name/Age/Gender Alyssa [name] 89 y.o. female Room/Bed: WA09/WA09  Code Status   Code Status: DNR  Home/SNF/Other Home Patient oriented to: self, place, time, and situation Is this baseline? Yes   Triage Complete: Triage complete  Chief Complaint Hyponatremia [E87.1]  Triage Note Patient fell last night after her legs gave out due to weakness. Hit the back of her head and lower back on the carpet when she fell. Is taking eloquis. Increased confusion for past 2 days family stated possible UTI. Complaining of lower back pain and increased bilateral ankle swelling. Denies burning urination.    Allergies Allergies  Allergen Reactions   Penicillins Diarrhea    Has IBS (caused diarrhea)    Level of Care/Admitting Diagnosis ED Disposition     ED Disposition  Admit   Condition  --   Comment  Hospital Area: [facility] [[id number]]  Level of Care: Progressive [102]  Admit to Progressive based on following criteria: NEPHROLOGY stable condition requiring close monitoring for AKI, requiring Hemodialysis or Peritoneal Dialysis either from expected electrolyte imbalance, acidosis, or fluid overload that can be managed by NIPPV or high flow oxygen.  May place patient in observation at [facility] or [facility] if equivalent level of care is available:: No  Covid Evaluation: Asymptomatic - no recent exposure (last 10 days) testing not required  Diagnosis: Hyponatremia [198519]  Admitting Physician: Bobette Mo [[id number]]  Attending Physician: Bobette Mo [[id number]]          B Medical/Surgery History Past Medical History:  Diagnosis Date   Anxiety    Arthritis    Atrial fibrillation (HCC)    pacemaker, chronic anticoag   Breast cancer (HCC)    Diverticulosis    Fibroma    inner lips/mouth   GERD (gastroesophageal reflux disease)    Hx of breast cancer     Hyperkalemia    Hypertension    Hypothyroidism    IBS (irritable bowel syndrome)    Internal hemorrhoids    Left inguinal hernia    direct   MVP (mitral valve prolapse)    Osteoporosis    Renal insufficiency    Status post dilation of esophageal narrowing    Thyroid cancer [facility])    Past Surgical History:  Procedure Laterality Date   COLONOSCOPY  10/28/2005   internal hemorrhoids, diverticulosis (same as in 2002 and random bxs negative then)   EP IMPLANTABLE DEVICE N/A 02/26/2015   Procedure: PPM Generator Changeout;  Surgeon: Duke Salvia, MD;  Location: [facility] INVASIVE CV LAB;  Service: Cardiovascular;  Laterality: N/A;   MASTECTOMY  1982   Bilateral   PACEMAKER INSERTION     THYROIDECTOMY     TONSILLECTOMY     UPPER GASTROINTESTINAL ENDOSCOPY  09/06/2000   normal     A IV Location/Drains/Wounds Patient Lines/Drains/Airways Status     Active Line/Drains/Airways     Name Placement date Placement time Site Days   Peripheral IV 09/30/22 20 G 1.25" Anterior;Right Forearm 09/30/22  1133  Forearm  less than 1            Intake/Output Last 24 hours No intake or output data in the 24 hours ending 09/30/22 1506  Labs/Imaging Results for orders placed or performed during the hospital encounter of 09/30/22 (from the past 48 hour(s))  Basic metabolic panel     Status: Abnormal   Collection Time: 09/30/22  11:24 AM  Result Value Ref Range   Sodium 121 (L) 135 - 145 mmol/L   Potassium 3.6 3.5 - 5.1 mmol/L   Chloride 89 (L) 98 - 111 mmol/L   CO2 21 (L) 22 - 32 mmol/L   Glucose, Bld 114 (H) 70 - 99 mg/dL    Comment: Glucose reference range applies only to samples taken after fasting for at least 8 hours.   BUN 13 8 - 23 mg/dL   Creatinine, Ser 4.09 0.44 - 1.00 mg/dL   Calcium 8.3 (L) 8.9 - 10.3 mg/dL   GFR, Estimated 59 (L) >60 mL/min    Comment: (NOTE) Calculated using the CKD-EPI Creatinine Equation (2021)    Anion gap 11 5 - 15    Comment: Performed at [facility], [address], [place], [place] [address]  CBC with Differential     Status: Abnormal   Collection Time: 09/30/22 11:24 AM  Result Value Ref Range   WBC 7.5 4.0 - 10.5 K/uL   RBC 3.62 (L) 3.87 - 5.11 MIL/uL   Hemoglobin 11.8 (L) 12.0 - 15.0 g/dL   HCT 47.8 (L) 29.5 - 62.1 %   MCV 93.6 80.0 - 100.0 fL   MCH 32.6 26.0 - 34.0 pg   MCHC 34.8 30.0 - 36.0 g/dL   RDW 30.8 65.7 - 84.6 %   Platelets 164 150 - 400 K/uL   nRBC 0.0 0.0 - 0.2 %   Neutrophils Relative % 82 %   Neutro Abs 6.3 1.7 - 7.7 K/uL   Lymphocytes Relative 10 %   Lymphs Abs 0.7 0.7 - 4.0 K/uL   Monocytes Relative 7 %   Monocytes Absolute 0.5 0.1 - 1.0 K/uL   Eosinophils Relative 0 %   Eosinophils Absolute 0.0 0.0 - 0.5 K/uL   Basophils Relative 0 %   Basophils Absolute 0.0 0.0 - 0.1 K/uL   Immature Granulocytes 1 %   Abs Immature Granulocytes 0.04 0.00 - 0.07 K/uL    Comment: Performed at [facility], [address] [place], [place] [address]  CBG monitoring, ED     Status: Abnormal   Collection Time: 09/30/22 11:26 AM  Result Value Ref Range   Glucose-Capillary 126 (H) 70 - 99 mg/dL    Comment: Glucose reference range applies only to samples taken after fasting for at least 8 hours.  Urinalysis, Routine w reflex microscopic -Urine, Catheterized     Status: Abnormal   Collection Time: 09/30/22 12:30 PM  Result Value Ref Range   Color, Urine STRAW (A) YELLOW   APPearance CLEAR CLEAR   Specific Gravity, Urine 1.002 (L) 1.005 - 1.030   pH 7.0 5.0 - 8.0   Glucose, UA NEGATIVE NEGATIVE mg/dL   Hgb urine dipstick SMALL (A) NEGATIVE   Bilirubin Urine NEGATIVE NEGATIVE   Ketones, ur NEGATIVE NEGATIVE mg/dL   Protein, ur NEGATIVE NEGATIVE mg/dL   Nitrite NEGATIVE NEGATIVE   Leukocytes,Ua TRACE (A) NEGATIVE   RBC / HPF 0-5 0 - 5 RBC/hpf   WBC, UA 0-5 0 - 5 WBC/hpf   Bacteria, UA RARE (A) NONE SEEN   Squamous Epithelial / HPF 0-5 0 - 5 /HPF    Comment: Performed at [facility], [address], [place], [place] [address]   *Note: Due to a large number of results and/or encounters for the requested time period, some results have not been displayed. A complete set of results can be found in Results Review.  CT Head Wo Contrast  Result Date: 09/30/2022 CLINICAL DATA:  Head trauma, minor (Age >= 65y); Neck trauma (Age >= 65y) EXAM: CT HEAD WITHOUT CONTRAST CT CERVICAL SPINE WITHOUT CONTRAST TECHNIQUE: Multidetector CT imaging of the head and cervical spine was performed following the standard protocol without intravenous contrast. Multiplanar CT image reconstructions of the cervical spine were also generated. RADIATION DOSE REDUCTION: This exam was performed according to the departmental dose-optimization program which includes automated exposure control, adjustment of the mA and/or kV according to patient size and/or use of iterative reconstruction technique. COMPARISON:  None Available. FINDINGS: CT HEAD FINDINGS Brain: No evidence of acute infarction, hemorrhage, hydrocephalus, extra-axial collection or mass lesion/mass effect. Sequela of moderate chronic microvascular ischemic change. Generalized volume loss. Vascular: No hyperdense vessel or unexpected calcification. Skull: Normal. Negative for fracture or focal lesion. Sinuses/Orbits: No middle ear or mastoid effusion. Paranasal sinuses are clear. Bilateral lens replacement. Orbits are otherwise unremarkable. Other: None. CT CERVICAL SPINE FINDINGS Alignment: Trace anterolisthesis of C3 on C4 and C4 on C5. Skull base and vertebrae: No acute fracture. No primary bone lesion or focal pathologic process. Soft tissues and spinal canal: No prevertebral fluid or swelling. No visible canal hematoma. Disc levels:  No evidence of high-grade spinal canal stenosis. Upper chest: There is nonspecific soft tissue density mass of the right lung apex (series 3, image 14). Other: None IMPRESSION: 1. No acute intracranial  abnormality. 2. No acute fracture or traumatic subluxation of the cervical spine. 3. Nonspecific soft tissue density at the right lung apex. This is incompletely assessed due to respiratory motion artifact. Consider further evaluation with a chest CT. Electronically Signed   By: Lorenza Cambridge M.D.   On: 09/30/2022 13:44   CT Cervical Spine Wo Contrast  Result Date: 09/30/2022 CLINICAL DATA:  Head trauma, minor (Age >= 65y); Neck trauma (Age >= 65y) EXAM: CT HEAD WITHOUT CONTRAST CT CERVICAL SPINE WITHOUT CONTRAST TECHNIQUE: Multidetector CT imaging of the head and cervical spine was performed following the standard protocol without intravenous contrast. Multiplanar CT image reconstructions of the cervical spine were also generated. RADIATION DOSE REDUCTION: This exam was performed according to the departmental dose-optimization program which includes automated exposure control, adjustment of the mA and/or kV according to patient size and/or use of iterative reconstruction technique. COMPARISON:  None Available. FINDINGS: CT HEAD FINDINGS Brain: No evidence of acute infarction, hemorrhage, hydrocephalus, extra-axial collection or mass lesion/mass effect. Sequela of moderate chronic microvascular ischemic change. Generalized volume loss. Vascular: No hyperdense vessel or unexpected calcification. Skull: Normal. Negative for fracture or focal lesion. Sinuses/Orbits: No middle ear or mastoid effusion. Paranasal sinuses are clear. Bilateral lens replacement. Orbits are otherwise unremarkable. Other: None. CT CERVICAL SPINE FINDINGS Alignment: Trace anterolisthesis of C3 on C4 and C4 on C5. Skull base and vertebrae: No acute fracture. No primary bone lesion or focal pathologic process. Soft tissues and spinal canal: No prevertebral fluid or swelling. No visible canal hematoma. Disc levels:  No evidence of high-grade spinal canal stenosis. Upper chest: There is nonspecific soft tissue density mass of the right lung  apex (series 3, image 14). Other: None IMPRESSION: 1. No acute intracranial abnormality. 2. No acute fracture or traumatic subluxation of the cervical spine. 3. Nonspecific soft tissue density at the right lung apex. This is incompletely assessed due to respiratory motion artifact. Consider further evaluation with a chest CT. Electronically Signed   By: Lorenza Cambridge M.D.   On: 09/30/2022 13:44   CT ABDOMEN PELVIS WO CONTRAST  Result Date: 09/30/2022 CLINICAL DATA:  Fall at home last night. On anticoagulation. Abdominal and back pain. EXAM: CT ABDOMEN AND PELVIS WITHOUT CONTRAST TECHNIQUE: Multidetector CT imaging of the abdomen and pelvis was performed following the standard protocol without IV contrast. RADIATION DOSE REDUCTION: This exam was performed according to the departmental dose-optimization program which includes automated exposure control, adjustment of the mA and/or kV according to patient size and/or use of iterative reconstruction technique. COMPARISON:  11/04/2021 FINDINGS: Lower chest: New small bilateral pleural effusions. Stable cardiomegaly and small pericardial effusion. Hepatobiliary: No hepatic parenchymal injury or mass identified on this noncontrast exam. Small cyst in the lateral segment of the left hepatic lobe remains stable. Gallbladder is unremarkable. No evidence of biliary ductal dilatation. Pancreas: No parenchymal abnormality identified on this noncontrast exam. Spleen: No evidence of parenchymal injury on this noncontrast exam. Adrenal/Urinary Tract: No hemorrhage or parenchymal injury identified on this noncontrast exam. Stomach/Bowel: Diverticulosis is seen mainly involving the sigmoid colon, however there is no evidence of diverticulitis. No focal inflammatory process identified. Diffuse mesenteric and body wall edema new since previous study. Mild low-attenuation ascites is also new. Vascular/Lymphatic: No evidence of retroperitoneal hemorrhage. No pathologically enlarged  lymph nodes identified. Reproductive: Several small calcified uterine fibroids are again noted largest measuring approximately 1 cm. Other: Small left inguinal hernia containing small amount of fluid remains stable. Musculoskeletal: No acute fractures or suspicious bone lesions identified. Old T12 vertebral body compression fracture is unchanged. IMPRESSION: No definite acute traumatic injury identified on this noncontrast exam. New anasarca with mild low-attenuation ascites. Colonic diverticulosis, without radiographic evidence of diverticulitis. Stable small calcified uterine fibroids. Stable small left inguinal hernia containing small amount of fluid. Stable cardiomegaly and small pericardial effusion. Electronically Signed   By: Danae Orleans M.D.   On: 09/30/2022 13:32   DG Chest Portable 1 View  Result Date: 09/30/2022 CLINICAL DATA:  altered mental status.  Increased ankle swelling. EXAM: PORTABLE CHEST 1 VIEW COMPARISON:  09/23/2022. FINDINGS: Bilateral lung fields are clear. There is blunting of left lateral costophrenic angle, which may represent underlying trace pleural effusion. Right lateral costophrenic angle is clear. Stable cardio-mediastinal silhouette. No acute osseous abnormalities. There is a left sided 2-lead pacemaker. The soft tissues are within normal limits. Redemonstration of metallic staples along the right axillary region. IMPRESSION: Possible trace left pleural effusion. No acute cardiopulmonary abnormality. Electronically Signed   By: Jules Schick M.D.   On: 09/30/2022 11:53    Pending Labs Unresulted Labs (From admission, onward)     Start     Ordered   10/01/22 0500  Comprehensive metabolic panel  Tomorrow morning,   R        09/30/22 1432   10/01/22 0500  CBC  Tomorrow morning,   R        09/30/22 1432   09/30/22 1439  Hepatic function panel  Add-on,   AD        09/30/22 1438   09/30/22 1129  Blood culture (routine x 2)  BLOOD CULTURE X 2,   R (with STAT occurrences)       09/30/22 1128            Vitals/Pain Today's Vitals   09/30/22 1054 09/30/22 1055 09/30/22 1133 09/30/22 1400  BP: 119/67   (!) 143/60  Pulse: 64   61  Resp: 18   17  Temp: 98.5 F (36.9 C)  99.5 F (37.5 C) 98 F (36.7 C)  TempSrc: Oral  Rectal Oral  SpO2: 100%   95%  Weight:  46.7 kg    Height:  5\' 6"  (1.676 m)    PainSc:  5       Isolation Precautions No active isolations  Medications Medications  cefTRIAXone (ROCEPHIN) 1 g in sodium chloride 0.9 % 100 mL IVPB (has no administration in time range)  acetaminophen (TYLENOL) tablet 650 mg (has no administration in time range)    Or  acetaminophen (TYLENOL) suppository 650 mg (has no administration in time range)  0.9 %  sodium chloride infusion ( Intravenous New Bag/Given 09/30/22 1503)  ondansetron (ZOFRAN) tablet 4 mg (has no administration in time range)    Or  ondansetron (ZOFRAN) injection 4 mg (has no administration in time range)  cefTRIAXone (ROCEPHIN) 1 g in sodium chloride 0.9 % 100 mL IVPB (0 g Intravenous Stopped 09/30/22 1505)  sodium chloride 0.9 % bolus 500 mL (500 mLs Intravenous New Bag/Given 09/30/22 1502)    Mobility walks     Focused Assessments     R Recommendations: See Admitting Provider Note  Report given to:   Additional Notes:

## 2022-09-30 NOTE — ED Triage Notes (Addendum)
Patient fell last night after her legs gave out due to weakness. Hit the back of her head and lower back on the carpet when she fell. Is taking eloquis. Increased confusion for past 2 days family stated possible UTI. Complaining of lower back pain and increased bilateral ankle swelling. Denies burning urination.

## 2022-09-30 NOTE — H&P (Signed)
History and Physical    Patient: Alyssa [name] BJY:[id number] DOB: [birth year] DOA: 09/30/2022 DOS: the patient was seen and examined on 09/30/2022 PCP: [name], MD  Patient coming from: Home  Chief Complaint:  Chief Complaint  Patient presents with   Fall   HPI: Alyssa [name] is a 89 y.o. female with medical history significant of anxiety, osteoarthritis, chronic atrial fibrillation on apixaban, breast cancer, diverticulosis, fibroma, GERD, hyperkalemia, hypertension, thyroid cancer, hypothyroidism, IBS, internal hemorrhoids, left inguinal hernia, mitral valve prolapse, osteoporosis, stage IIIa CKD who was brought to the emergency department via EMS after she had a fall at home hitting the back of her head and lower back on the carpet.  She apparently has been having increased confusion for the past 2 days according to her neighbors and her family.  She has only been using furosemide as needed, but last used it earlier today.  Her appetite seems to be decreased.  She denied fever, chills, rhinorrhea, sore throat, wheezing or hemoptysis.  No chest pain, palpitations, diaphoresis, PND, orthopnea, but has been having pitting edema of the lower extremities.  No abdominal pain, nausea, emesis, constipation, melena or hematochezia, but gets frequent loose stools.  No flank pain, dysuria, frequency or hematuria.  No polyuria, polydipsia, polyphagia or blurred vision.   ED course: Initial vital signs were temperature 98.5 F, pulse 64, respiration 18, BP 119/67 mmHg O2 sat 100% on room air.  The patient received ceftriaxone 1 g IVPB.  She received ceftriaxone 1 g IVPB.  I added this  Lab work: Her urine analysis with a decrease of specific gravity at 1.002, small bilirubin and trace leukocyte esterase.  There was rare bacteria on microscopic examination.  CBC showed a white count 7.5, hemoglobin 11.8 g/dL platelets 086.  BMP showed a sodium 121, potassium 3.6, chloride 89 and CO2 21 mmol/L  with a normal anion gap.  Renal function was normal.  Calcium was 8.3 and glucose 114 mg/dL.  Imaging: Portable 1 view chest radiograph with possible trace left pleural effusion.  No acute cardiopulmonary abnormality.  CT head without contrast no acute intracranial abnormality.  CT cervical spine with no acute fracture or traumatic subluxation of the C-spine.  There was a nonspecific soft tissue density at the right lung apex.  Consider further evaluation with chest CT.  CT abdomen/pelvis without contrast with no definite acute traumatic injury.  There was [place] with mild low attenuation ascites.  Colonic diverticulosis, without radiographic evidence of diverticulitis.  Stable small calcified uterine fibroids.  Stable small left inguinal hernia containing small amount of fluid.  Stable cardiomegaly and small pericardial effusion.   Review of Systems: As mentioned in the history of present illness. All other systems reviewed and are negative. Past Medical History:  Diagnosis Date   Anxiety    Arthritis    Atrial fibrillation (HCC)    pacemaker, chronic anticoag   Breast cancer (HCC)    Diverticulosis    Fibroma    inner lips/mouth   GERD (gastroesophageal reflux disease)    Hx of breast cancer    Hyperkalemia    Hypertension    Hypothyroidism    IBS (irritable bowel syndrome)    Internal hemorrhoids    Left inguinal hernia    direct   MVP (mitral valve prolapse)    Osteoporosis    Renal insufficiency    Status post dilation of esophageal narrowing    Thyroid cancer (HCC)    Past Surgical History:  Procedure Laterality Date   COLONOSCOPY  10/28/2005   internal hemorrhoids, diverticulosis (same as in 2002 and random bxs negative then)   EP IMPLANTABLE DEVICE N/A 02/26/2015   Procedure: PPM Generator Changeout;  Surgeon: Duke Salvia, MD;  Location: [facility] INVASIVE CV LAB;  Service: Cardiovascular;  Laterality: N/A;   MASTECTOMY  1982   Bilateral   PACEMAKER INSERTION      THYROIDECTOMY     TONSILLECTOMY     UPPER GASTROINTESTINAL ENDOSCOPY  09/06/2000   normal   Social History:  reports that she quit smoking about 68 years ago. Her smoking use included cigarettes. She has never used smokeless tobacco. She reports that she does not drink alcohol and does not use drugs.  Allergies  Allergen Reactions   Penicillins Diarrhea    Has IBS (caused diarrhea)    Family History  Problem Relation Age of Onset   Stroke Mother    Colon cancer Father 33   Heart disease Father    Arthritis Other    Hypertension Other    Lung cancer Son    Esophageal cancer Neg Hx    Pancreatic cancer Neg Hx    Kidney disease Neg Hx    Liver disease Neg Hx    Diabetes Neg Hx    Rectal cancer Neg Hx    Stomach cancer Neg Hx     Prior to Admission medications   Medication Sig Start Date End Date Taking? Authorizing Provider  acetaminophen (TYLENOL) 650 MG CR tablet Take 1,300 mg by mouth 2 (two) times daily.   Yes [provider]  albuterol (VENTOLIN HFA) 108 (90 Base) MCG/ACT inhaler Inhale 2 puffs into the lungs every 6 (six) hours as needed. 08/03/22  Yes Charlott Holler, MD  diazepam (VALIUM) 2 MG tablet Take 1 tablet (2 mg total) by mouth every 8 (eight) hours as needed for anxiety. 07/09/22  Yes [name], MD  ELIQUIS 2.5 MG TABS tablet TAKE 1 TABLET BY MOUTH TWICE A DAY 05/31/22  Yes Duke Salvia, MD  fluconazole (DIFLUCAN) 100 MG tablet Take 1 tablet (100 mg total) by mouth daily for 12 days. 09/26/22 10/08/22 Yes Alwyn Ren, MD  furosemide (LASIX) 40 MG tablet TAKE 1 TABLET BY MOUTH DAILY; MAY TAKE ADDITIONAL HALF TABLET AS NEEDED FOR WEIGHT GAIN Patient taking differently: Take 40 mg by mouth daily. 09/09/22  Yes [name], MD  levothyroxine (SYNTHROID) 50 MCG tablet Take 1 tablet (50 mcg total) by mouth daily before breakfast. 07/08/22  Yes [name], MD  mometasone-formoterol (DULERA) 100-5 MCG/ACT AERO Inhale 2 puffs into the lungs 2  (two) times daily.   Yes [provider]  omeprazole (PRILOSEC) 40 MG capsule Take 1 capsule (40 mg total) by mouth daily. 09/20/22  Yes McMichael, Bayley M, PA-C  Propylene Glycol (SYSTANE COMPLETE OP) Apply 1 drop to eye as needed (dryness for right eye).   Yes [provider]  Wheat Dextrin (BENEFIBER PO) Take 1 Dose by mouth daily.   Yes [provider]  famotidine (PEPCID) 40 MG tablet Take 1 tablet (40 mg total) by mouth at bedtime. Patient not taking: Reported on 09/30/2022 09/14/22   Legrand Como, PA-C  MISC [employer]ODU[employer] EX Apply 1 application topically in the morning and at bedtime. CBD cream for back and ankle pain    [provider]    Physical Exam: Vitals:   09/30/22 1054 09/30/22 1055 09/30/22 1133 09/30/22 1400  BP: 119/67   Marland Kitchen)  143/60  Pulse: 64   61  Resp: 18   17  Temp: 98.5 F (36.9 C)  99.5 F (37.5 C) 98 F (36.7 C)  TempSrc: Oral  Rectal Oral  SpO2: 100%   95%  Weight:  46.7 kg    Height:  5\' 6"  (1.676 m)     Physical Exam Vitals and nursing note reviewed.  Constitutional:      General: She is awake. She is not in acute distress.    Appearance: Normal appearance. She is underweight. She is ill-appearing.     Comments: Frail, elderly female.  HENT:     Head: Normocephalic.     Nose: No rhinorrhea.     Mouth/Throat:     Mouth: Mucous membranes are moist.  Eyes:     General: No scleral icterus.    Pupils: Pupils are equal, round, and reactive to light.  Neck:     Vascular: No JVD.  Cardiovascular:     Rate and Rhythm: Normal rate.     Heart sounds: S1 normal and S2 normal.  Pulmonary:     Effort: Pulmonary effort is normal.     Breath sounds: Normal breath sounds. No wheezing, rhonchi or rales.  Abdominal:     General: Bowel sounds are normal.     Palpations: Abdomen is soft.  Musculoskeletal:     Cervical back: Neck supple.     Right lower leg: No edema.     Left lower leg: No edema.  Skin:     General: Skin is warm and dry.     Findings: Bruising, ecchymosis, erythema and signs of injury present.     Comments: Left pretibial area erythema, edema, calor, TTP and ecchymosis.  Left foot ecchymosis and large blister like area.  Please see pictures below.  Neurological:     General: No focal deficit present.     Mental Status: She is alert. She is disoriented.  Psychiatric:        Mood and Affect: Mood normal.        Behavior: Behavior is cooperative.          Data Reviewed:  Results are pending, will review when available. 04/06/2021 transthoracic echocardiogram: IMPRESSIONS    1. Left ventricular ejection fraction, by estimation, is 55 to 60%. The  left ventricle has normal function. The left ventricle has no regional  wall motion abnormalities. Left ventricular diastolic parameters were  normal. There is hypokinesis of the left   ventricular, apical segment.   2. Right ventricular systolic function is normal. The right ventricular  size is mildly enlarged. There is normal pulmonary artery systolic  pressure. The estimated right ventricular systolic pressure is 34.6 mmHg.   3. Left atrial size was severely dilated.   4. Right atrial size was severely dilated.   5. A small pericardial effusion is present. The pericardial effusion is  anterior to the right ventricle.   6. The mitral valve is normal in structure. Mild mitral valve  regurgitation. No evidence of mitral stenosis.   7. Tricuspid valve regurgitation is severe.   8. The aortic valve is normal in structure. Aortic valve regurgitation is  not visualized. No aortic stenosis is present.   9. The inferior vena cava is dilated in size with >50% respiratory  variability, suggesting right atrial pressure of 8 mmHg.   EKG: Vent. rate 61 BPM PR interval 232 ms QRS duration 166 ms QT/QTcB 455/459 ms P-R-T axes 0 -85 93 Ventricular-paced complexes No further  analysis attempted due to paced  [id number]  Assessment and Plan: Principal Problem:   Hyponatremia Observation/PCU. Seems to be dilutional. Free water restriction. Monitor intake and output. Check urine osmolality and sodium level. Continue gentle/time-limited normal saline infusion. Sodium chloride 1 g p.o. x 1 dose. Follow sodium level in the morning.  Active Problems:   Hypothyroidism Continue with thyroxine 50 mcg p.o. daily.    GERD Continue omeprazole or formulary PPI.    Chronic kidney disease, stage 3b (HCC) Improved over baseline. Follow-up GFR in AM.    Permanent atrial fibrillation (HCC) CHA?DS?-VASc Score of at least 5. Continue apixaban 2.5 mg p.o. twice daily.    Essential hypertension, benign Currently off furosemide. Monitor blood pressure.    Mild asthma Bronchodilators as needed. Supplemental oxygen as needed.    Advance Care Planning:   Code Status: DNR   Consults:   Family Communication: Two of her neighbors were at bedside.  Her daughter is coming in again from [facility].  Severity of Illness: The appropriate patient status for this patient is OBSERVATION. Observation status is judged to be reasonable and necessary in order to provide the required intensity of service to ensure the patient's safety. The patient's presenting symptoms, physical exam findings, and initial radiographic and laboratory data in the context of their medical condition is felt to place them at decreased risk for further clinical deterioration. Furthermore, it is anticipated that the patient will be medically stable for discharge from the hospital within 2 midnights of admission.   Author: Bobette Mo, MD 09/30/2022 2:22 PM  For on call review [email or web address].   This document was prepared using Dragon voice recognition software and may contain some unintended transcription errors.

## 2022-09-30 NOTE — ED Notes (Signed)
Post bladder scan 682 ml

## 2022-10-01 ENCOUNTER — Observation Stay (HOSPITAL_COMMUNITY): Payer: Medicare Other

## 2022-10-01 DIAGNOSIS — Z79899 Other long term (current) drug therapy: Secondary | ICD-10-CM | POA: Diagnosis not present

## 2022-10-01 DIAGNOSIS — L03115 Cellulitis of right lower limb: Secondary | ICD-10-CM | POA: Diagnosis present

## 2022-10-01 DIAGNOSIS — R9431 Abnormal electrocardiogram [ECG] [EKG]: Secondary | ICD-10-CM

## 2022-10-01 DIAGNOSIS — Z950 Presence of cardiac pacemaker: Secondary | ICD-10-CM | POA: Diagnosis not present

## 2022-10-01 DIAGNOSIS — I517 Cardiomegaly: Secondary | ICD-10-CM

## 2022-10-01 DIAGNOSIS — Z823 Family history of stroke: Secondary | ICD-10-CM | POA: Diagnosis not present

## 2022-10-01 DIAGNOSIS — Z85850 Personal history of malignant neoplasm of thyroid: Secondary | ICD-10-CM | POA: Diagnosis not present

## 2022-10-01 DIAGNOSIS — E878 Other disorders of electrolyte and fluid balance, not elsewhere classified: Secondary | ICD-10-CM | POA: Diagnosis present

## 2022-10-01 DIAGNOSIS — Z681 Body mass index (BMI) 19 or less, adult: Secondary | ICD-10-CM | POA: Diagnosis not present

## 2022-10-01 DIAGNOSIS — E861 Hypovolemia: Secondary | ICD-10-CM | POA: Diagnosis present

## 2022-10-01 DIAGNOSIS — E876 Hypokalemia: Secondary | ICD-10-CM | POA: Diagnosis present

## 2022-10-01 DIAGNOSIS — Z8249 Family history of ischemic heart disease and other diseases of the circulatory system: Secondary | ICD-10-CM | POA: Diagnosis not present

## 2022-10-01 DIAGNOSIS — J4520 Mild intermittent asthma, uncomplicated: Secondary | ICD-10-CM | POA: Diagnosis present

## 2022-10-01 DIAGNOSIS — I129 Hypertensive chronic kidney disease with stage 1 through stage 4 chronic kidney disease, or unspecified chronic kidney disease: Secondary | ICD-10-CM | POA: Diagnosis present

## 2022-10-01 DIAGNOSIS — E890 Postprocedural hypothyroidism: Secondary | ICD-10-CM | POA: Diagnosis present

## 2022-10-01 DIAGNOSIS — Z87891 Personal history of nicotine dependence: Secondary | ICD-10-CM | POA: Diagnosis not present

## 2022-10-01 DIAGNOSIS — Z79890 Hormone replacement therapy (postmenopausal): Secondary | ICD-10-CM | POA: Diagnosis not present

## 2022-10-01 DIAGNOSIS — W1839XA Other fall on same level, initial encounter: Secondary | ICD-10-CM | POA: Diagnosis present

## 2022-10-01 DIAGNOSIS — M810 Age-related osteoporosis without current pathological fracture: Secondary | ICD-10-CM | POA: Diagnosis present

## 2022-10-01 DIAGNOSIS — K219 Gastro-esophageal reflux disease without esophagitis: Secondary | ICD-10-CM | POA: Diagnosis present

## 2022-10-01 DIAGNOSIS — Z66 Do not resuscitate: Secondary | ICD-10-CM | POA: Diagnosis present

## 2022-10-01 DIAGNOSIS — E871 Hypo-osmolality and hyponatremia: Secondary | ICD-10-CM | POA: Diagnosis not present

## 2022-10-01 DIAGNOSIS — Z7901 Long term (current) use of anticoagulants: Secondary | ICD-10-CM | POA: Diagnosis not present

## 2022-10-01 DIAGNOSIS — Z853 Personal history of malignant neoplasm of breast: Secondary | ICD-10-CM | POA: Diagnosis not present

## 2022-10-01 DIAGNOSIS — L03116 Cellulitis of left lower limb: Secondary | ICD-10-CM | POA: Diagnosis present

## 2022-10-01 DIAGNOSIS — N1832 Chronic kidney disease, stage 3b: Secondary | ICD-10-CM | POA: Diagnosis present

## 2022-10-01 DIAGNOSIS — Y92002 Bathroom of unspecified non-institutional (private) residence single-family (private) house as the place of occurrence of the external cause: Secondary | ICD-10-CM | POA: Diagnosis not present

## 2022-10-01 DIAGNOSIS — I4821 Permanent atrial fibrillation: Secondary | ICD-10-CM | POA: Diagnosis present

## 2022-10-01 LAB — COMPREHENSIVE METABOLIC PANEL
ALT: 28 U/L (ref 0–44)
AST: 37 U/L (ref 15–41)
Albumin: 2.9 g/dL — ABNORMAL LOW (ref 3.5–5.0)
Alkaline Phosphatase: 48 U/L (ref 38–126)
Anion gap: 9 (ref 5–15)
BUN: 11 mg/dL (ref 8–23)
CO2: 25 mmol/L (ref 22–32)
Calcium: 7.6 mg/dL — ABNORMAL LOW (ref 8.9–10.3)
Chloride: 94 mmol/L — ABNORMAL LOW (ref 98–111)
Creatinine, Ser: 0.92 mg/dL (ref 0.44–1.00)
GFR, Estimated: 57 mL/min — ABNORMAL LOW (ref >60)
Glucose, Bld: 100 mg/dL — ABNORMAL HIGH (ref 70–99)
Potassium: 3.2 mmol/L — ABNORMAL LOW (ref 3.5–5.1)
Sodium: 128 mmol/L — ABNORMAL LOW (ref 135–145)
Total Bilirubin: 0.9 mg/dL (ref 0.3–1.2)
Total Protein: 4.6 g/dL — ABNORMAL LOW (ref 6.5–8.1)

## 2022-10-01 LAB — ECHOCARDIOGRAM COMPLETE
Area-P 1/2: 3.15 cm2
Est EF: 55
Height: 66 in
MV M vel: 2.56 m/s
MV Peak grad: 26.1 mmHg
S' Lateral: 3 cm
Weight: 1648 [oz_av]

## 2022-10-01 LAB — CBC
HCT: 31.5 % — ABNORMAL LOW (ref 36.0–46.0)
Hemoglobin: 10.4 g/dL — ABNORMAL LOW (ref 12.0–15.0)
MCH: 31.9 pg (ref 26.0–34.0)
MCHC: 33 g/dL (ref 30.0–36.0)
MCV: 96.6 fL (ref 80.0–100.0)
Platelets: 144 10*3/uL — ABNORMAL LOW (ref 150–400)
RBC: 3.26 MIL/uL — ABNORMAL LOW (ref 3.87–5.11)
RDW: 11.7 % (ref 11.5–15.5)
WBC: 5.3 10*3/uL (ref 4.0–10.5)
nRBC: 0 % (ref 0.0–0.2)

## 2022-10-01 LAB — OSMOLALITY, URINE: Osmolality, Ur: 184 mosm/kg — ABNORMAL LOW (ref 300–900)

## 2022-10-01 MED ORDER — ALUM & MAG HYDROXIDE-SIMETH 200-200-20 MG/5ML PO SUSP
30.0000 mL | ORAL | Status: DC | PRN
  Administered 2022-10-01 – 2022-10-05 (×7): 30 mL via ORAL
  Filled 2022-10-01 (×7): qty 30

## 2022-10-01 MED ORDER — POTASSIUM CHLORIDE CRYS ER 20 MEQ PO TBCR
40.0000 meq | EXTENDED_RELEASE_TABLET | Freq: Two times a day (BID) | ORAL | Status: AC
  Administered 2022-10-01 – 2022-10-02 (×4): 40 meq via ORAL
  Filled 2022-10-01 (×4): qty 2

## 2022-10-01 NOTE — Evaluation (Signed)
Physical Therapy Evaluation Patient Details Name: Alyssa [name] MRN: [id number] DOB: [birth year] Today's Date: 10/01/2022  History of Present Illness  89 y.o. female presents to therapy s/p hospital admission on 09/30/2022 due to mechanical fall in home setting resulting in pt striking her head and UTI. Imaging negative for acute findings. Pt recently hospitalized 8/15-8/18/2024  due to dysphagia.  Pt has medical history significant for permanent A-fib on Eliquis, CHB s/p PPM, chronic HFpEF, severe TR, CKD stage IIIb, asthma, acquired hypothyroidism (s/p thyroidectomy for thyroid cancer), and anxiety.  Clinical Impression      Pt admitted with above diagnosis.  Pt currently with functional limitations due to the deficits listed below (see PT Problem List). Pt in bed when PT arrived. Pt agreeable to therapy intervention. Pt is S for bed mobility and transfer tasks no AD. Gait assessed in hallway 300 feet without AD with mild instability noted with wide BOS and LOB x 1 requiring min A to recover. Pt reported daughter wanted to speak with PT, pt able to call daughter with PT in room. Daughter is requesting SNF for short term rehab per MD recommendation due to recent fall and re-hospitalization. PT provided pt and family ed on pts CLOF being higher level and insurance may not authorize at time of d/c  PT suggested caregiver support in home setting with [facility] services until pt transitions to ILF in [place] to be closer to her daughter. Daughter demonstrated verbal understanding. Pt left seated in recliner and all needs in place.   Pt will benefit from acute skilled PT to increase their independence and safety with mobility to allow discharge.       If plan is discharge home, recommend the following: A little help with walking and/or transfers;A little help with bathing/dressing/bathroom;Assistance with cooking/housework;Help with stairs or ramp for entrance;Assist for transportation   Can travel by  private vehicle        Equipment Recommendations None recommended by PT  Recommendations for Other Services       Functional Status Assessment Patient has had a recent decline in their functional status and demonstrates the ability to make significant improvements in function in a reasonable and predictable amount of time.     Precautions / Restrictions Precautions Precautions: Fall Restrictions Weight Bearing Restrictions: No      Mobility  Bed Mobility Overal bed mobility: Needs Assistance Bed Mobility: Supine to Sit     Supine to sit: Supervision     General bed mobility comments: min cues    Transfers Overall transfer level: Needs assistance Equipment used: None Transfers: Sit to/from Stand Sit to Stand: Supervision           General transfer comment: min cues for safety    Ambulation/Gait Ambulation/Gait assistance: Contact guard assist Gait Distance (Feet): 300 Feet Assistive device: None Gait Pattern/deviations: Step-through pattern, Decreased stride length, Drifts right/left, Wide base of support Gait velocity: decresed     General Gait Details: intermittent discontinous steps and stepping stratagies due to LOB, pt able to implement self recovery stratagies with one exception requiing min A to recover. pt may benefit from assessment for AD to improve safety and stabiltiy reports RW in home setting  Stairs            Wheelchair Mobility     Tilt Bed    Modified Rankin (Stroke Patients Only)       Balance Overall balance assessment: Needs assistance, History of Falls Sitting-balance support: No upper extremity supported, Feet supported  Sitting balance-Leahy Scale: Good     Standing balance support: No upper extremity supported Standing balance-Leahy Scale: Fair                               Pertinent Vitals/Pain Pain Assessment Pain Assessment: Faces Faces Pain Scale: Hurts a little bit Pain Location:  LBP/buttocks Pain Descriptors / Indicators: Aching, Discomfort Pain Intervention(s): Limited activity within patient's tolerance, Monitored during session    Home Living Family/patient expects to be discharged to:: Private residence Living Arrangements: Alone Available Help at Discharge: Family (dtr here from [employer]) Type of Home: Other(Comment) (condo on 3rd floor) Home Access: Elevator (pt prefers to take the steps)       Home Layout: One level Home Equipment: Agricultural consultant (2 wheels)      Prior Function Prior Level of Function : Independent/Modified Independent;Driving             Mobility Comments: very independent at her baseline; pt is active and  enjoys  doing yoga; pt is planning to move to [place] to be near her dtr in September       Extremity/Trunk Assessment        Lower Extremity Assessment Lower Extremity Assessment: Generalized weakness    Cervical / Trunk Assessment Cervical / Trunk Assessment:  (wfl)  Communication   Communication Communication: Hearing impairment (B hearing aids)  Cognition Arousal: Alert Behavior During Therapy: WFL for tasks assessed/performed Overall Cognitive Status: Within Functional Limits for tasks assessed                                          General Comments      Exercises     Assessment/Plan    PT Assessment    PT Problem List Decreased balance;Decreased mobility;Pain       PT Treatment Interventions      PT Goals (Current goals can be found in the Care Plan section)  Acute Rehab PT Goals Patient Stated Goal: home soon and get stronger PT Goal Formulation: With patient Time For Goal Achievement: 10/15/22 Potential to Achieve Goals: Good    Frequency       Co-evaluation               AM-PAC PT "6 Clicks" Mobility  Outcome Measure Help needed turning from your back to your side while in a flat bed without using bedrails?: None Help needed moving from lying on  your back to sitting on the side of a flat bed without using bedrails?: None Help needed moving to and from a bed to a chair (including a wheelchair)?: None Help needed standing up from a chair using your arms (e.g., wheelchair or bedside chair)?: A Little Help needed to walk in hospital room?: A Little Help needed climbing 3-5 steps with a railing? : A Lot 6 Click Score: 20    End of Session Equipment Utilized During Treatment: Gait belt Activity Tolerance: Patient tolerated treatment well (reported SOB and O2 saturation 93% on RA with exertion) Patient left: in chair;with call bell/phone within reach   PT Visit Diagnosis: Other abnormalities of gait and mobility (R26.89);Unsteadiness on feet (R26.81);History of falling (Z91.81);Difficulty in walking, not elsewhere classified (R26.2);Pain    Time: 1733-1757 PT Time Calculation (min) (ACUTE ONLY): 24 min   Charges:   PT Evaluation $PT Eval Low Complexity: 1  Low PT Treatments $Gait Training: 8-22 mins PT General Charges $$ ACUTE PT VISIT: 1 Visit         Johnny Bridge, PT Acute Rehab   Jacqualyn Posey 10/01/2022, 6:32 PM

## 2022-10-01 NOTE — Progress Notes (Signed)
PROGRESS NOTE    Puanani Scharrer  HQI:[id number] DOB: 1924/06/28 DOA: 09/30/2022 PCP: [name], MD    Brief Narrative:  89 year old lives alone has history of anxiety, chronic A-fib on apixaban, history of breast cancer, GERD, hypertension, hypothyroidism and CKD stage III AA brought to the emergency room with fall at home.  Apparently she was fixing her bed, she had multiple pillows on her foot side to elevate her edematous leg.  At least 6 of the pillows fell on her pushing her back and falling backward into the carpeted floor.  She had some headache.  In the emergency room, hemodynamically stable.  On room air.  Skeletal survey negative.  Sodium 121, chloride 89.  Admitted due to fall, hyponatremia.   Assessment & Plan:   Hypochloremic hyponatremia: Multifactorial.  Salt restriction.  Peripheral edema.  Received isotonic fluid overnight with good clinical response.  Sodium 121-128.  Stop further IV fluids to avoid sudden correction. -Encourage regular diet, do not restrict salt.  Improve nutrition.  Nutritional supplements. Recheck tomorrow morning.  Hypokalemia: Replace aggressively.  Recheck levels.  Check magnesium and phosphorus.  Fall with deconditioning: Significant issue.  Lives alone.  Work with PT OT.  May need skilled nursing facility referral for rehab.  Peripheral edema, redness of the left leg after fall: Patient does have significant peripheral edema which is likely due to venous stasis.  Discontinue diuretics.  Diuretics will not treat her dependent edema, it can make her dehydrated. Elevate in 1 pillow. Compression stockings. Empiric antibiotics started on admission, will continue until clinical improvement.  Chronic medical issues including Hypothyroidism, on Synthroid 50 mcg daily GERD, on PPI CKD stage IIIa, improved. Permanent A-fib, rate controlled.  Therapeutic on Eliquis. Mild intermittent asthma, bronchodilator as needed.  Goal of care: DNR as  below.  May not be safe to live independent.  Patient wants to keep her even dependency. She is ready to move to independent living facility nearby her daughter's town in [place] on September 19.  She may benefit with transitioning to a SNF for the time being. I discussed with patient and her daughter that she may be safer moving to assisted living facility so that she has more support system.  They will discuss about this.   DVT prophylaxis: apixaban (ELIQUIS) tablet 2.5 mg Start: 09/30/22 2200 apixaban (ELIQUIS) tablet 2.5 mg   Code Status: DNR Family Communication: Patient's daughter at the bedside Disposition Plan: Status is: Inpatient Remains inpatient appropriate because: Significant electrolyte abnormalities, unsafe discharge     Consultants:  None  Procedures:  None  Antimicrobials:  Rocephin 8/22---   Subjective: Patient seen and examined.  Patient tells me she is eating better today.  Denies any complaints.  She is worried about her leg swelling. Her daughter arrived to the bedside and we discussed about her care moving forward.  Patient remains mostly without symptoms today.  Objective: Vitals:   09/30/22 1605 09/30/22 2020 10/01/22 0033 10/01/22 0806  BP: (!) 155/66 135/73 (!) 117/58   Pulse: (!) 59 60 64   Resp:  18 18   Temp: 97.6 F (36.4 C) 98.7 F (37.1 C) 98.7 F (37.1 C)   TempSrc: Oral Oral Oral   SpO2: 93% 96% 93% 92%  Weight:      Height:        Intake/Output Summary (Last 24 hours) at 10/01/2022 1306 Last data filed at 10/01/2022 1305 Gross per 24 hour  Intake 1071.11 ml  Output 475 ml  Net  596.11 ml   Filed Weights   09/30/22 1055  Weight: 46.7 kg    Examination:  General exam: Appears calm and comfortable  Alert awake and oriented.  Pleasant to conversation. Appropriate for her age. Respiratory system: No added sounds. Cardiovascular system: S1 & S2 heard, RRR. Gastrointestinal system: Soft and nontender.  Bowel sound  present. Central nervous system: Alert and oriented. No focal neurological deficits. Extremities: Symmetric 5 x 5 power. Skin:  1+ edema both legs.  Slight redness with ecchymosis on the left leg.  No open wound.  Data Reviewed: I have personally reviewed following labs and imaging studies  CBC: Recent Labs  Lab 09/25/22 0658 09/30/22 1124 10/01/22 0357  WBC 3.8* 7.5 5.3  NEUTROABS  --  6.3  --   HGB 12.0 11.8* 10.4*  HCT 35.2* 33.9* 31.5*  MCV 95.4 93.6 96.6  PLT 154 164 144*   Basic Metabolic Panel: Recent Labs  Lab 09/25/22 0658 09/30/22 1124 10/01/22 0357  NA 133* 121* 128*  K 3.9 3.6 3.2*  CL 101 89* 94*  CO2 23 21* 25  GLUCOSE 84 114* 100*  BUN 10 13 11   CREATININE 0.95 0.90 0.92  CALCIUM 8.1* 8.3* 7.6*  MG 2.1  --   --    GFR: Estimated Creatinine Clearance: 26.4 mL/min (by C-G formula based on SCr of 0.92 mg/dL). Liver Function Tests: Recent Labs  Lab 09/25/22 0658 09/30/22 1124 10/01/22 0357  AST 46* 50* 37  ALT 29 37 28  ALKPHOS 51 53 48  BILITOT 1.0 1.0 0.9  PROT 5.2* 5.4* 4.6*  ALBUMIN 3.3* 3.3* 2.9*   No results for input(s): "LIPASE", "AMYLASE" in the last 168 hours. No results for input(s): "AMMONIA" in the last 168 hours. Coagulation Profile: No results for input(s): "INR", "PROTIME" in the last 168 hours. Cardiac Enzymes: No results for input(s): "CKTOTAL", "CKMB", "CKMBINDEX", "TROPONINI" in the last 168 hours. BNP (last 3 results) No results for input(s): "PROBNP" in the last 8760 hours. HbA1C: No results for input(s): "HGBA1C" in the last 72 hours. CBG: Recent Labs  Lab 09/30/22 1126  GLUCAP 126*   Lipid Profile: No results for input(s): "CHOL", "HDL", "LDLCALC", "TRIG", "CHOLHDL", "LDLDIRECT" in the last 72 hours. Thyroid Function Tests: No results for input(s): "TSH", "T4TOTAL", "FREET4", "T3FREE", "THYROIDAB" in the last 72 hours. Anemia Panel: No results for input(s): "VITAMINB12", "FOLATE", "FERRITIN", "TIBC", "IRON",  "RETICCTPCT" in the last 72 hours. Sepsis Labs: No results for input(s): "PROCALCITON", "LATICACIDVEN" in the last 168 hours.  Recent Results (from the past 240 hour(s))  Blood culture (routine x 2)     Status: None (Preliminary result)   Collection Time: 09/30/22 12:04 PM   Specimen: BLOOD RIGHT FOREARM  Result Value Ref Range Status   Specimen Description   Final    BLOOD RIGHT FOREARM Performed at [facility], [address], [place], [place] [address]    Special Requests   Final    BOTTLES DRAWN AEROBIC AND ANAEROBIC Blood Culture adequate volume Performed at [facility], [address] [place], [place] [address]    Culture   Final    NO GROWTH < 24 HOURS Performed at [facility], [address], [place], [place] [address]    Report Status PENDING  Incomplete  Blood culture (routine x 2)     Status: None (Preliminary result)   Collection Time: 09/30/22 12:09 PM   Specimen: BLOOD LEFT ARM  Result Value Ref Range Status  Specimen Description   Final    BLOOD LEFT ARM Performed at [facility], [address], [place], [place] [address]    Special Requests   Final    BOTTLES DRAWN AEROBIC AND ANAEROBIC Blood Culture adequate volume Performed at [facility], [address] [place], [place] [address]    Culture   Final    NO GROWTH < 24 HOURS Performed at [facility], [address] [place], [place] [address]    Report Status PENDING  Incomplete         Radiology Studies: CT Head Wo Contrast  Result Date: 09/30/2022 CLINICAL DATA:  Head trauma, minor (Age >= 65y); Neck trauma (Age >= 65y) EXAM: CT HEAD WITHOUT CONTRAST CT CERVICAL SPINE WITHOUT CONTRAST TECHNIQUE: Multidetector CT imaging of the head and cervical spine was performed following the standard protocol without intravenous contrast. Multiplanar CT image reconstructions of the cervical spine were also generated.  RADIATION DOSE REDUCTION: This exam was performed according to the departmental dose-optimization program which includes automated exposure control, adjustment of the mA and/or kV according to patient size and/or use of iterative reconstruction technique. COMPARISON:  None Available. FINDINGS: CT HEAD FINDINGS Brain: No evidence of acute infarction, hemorrhage, hydrocephalus, extra-axial collection or mass lesion/mass effect. Sequela of moderate chronic microvascular ischemic change. Generalized volume loss. Vascular: No hyperdense vessel or unexpected calcification. Skull: Normal. Negative for fracture or focal lesion. Sinuses/Orbits: No middle ear or mastoid effusion. Paranasal sinuses are clear. Bilateral lens replacement. Orbits are otherwise unremarkable. Other: None. CT CERVICAL SPINE FINDINGS Alignment: Trace anterolisthesis of C3 on C4 and C4 on C5. Skull base and vertebrae: No acute fracture. No primary bone lesion or focal pathologic process. Soft tissues and spinal canal: No prevertebral fluid or swelling. No visible canal hematoma. Disc levels:  No evidence of high-grade spinal canal stenosis. Upper chest: There is nonspecific soft tissue density mass of the right lung apex (series 3, image 14). Other: None IMPRESSION: 1. No acute intracranial abnormality. 2. No acute fracture or traumatic subluxation of the cervical spine. 3. Nonspecific soft tissue density at the right lung apex. This is incompletely assessed due to respiratory motion artifact. Consider further evaluation with a chest CT. Electronically Signed   By: Lorenza Cambridge M.D.   On: 09/30/2022 13:44   CT Cervical Spine Wo Contrast  Result Date: 09/30/2022 CLINICAL DATA:  Head trauma, minor (Age >= 65y); Neck trauma (Age >= 65y) EXAM: CT HEAD WITHOUT CONTRAST CT CERVICAL SPINE WITHOUT CONTRAST TECHNIQUE: Multidetector CT imaging of the head and cervical spine was performed following the standard protocol without intravenous contrast.  Multiplanar CT image reconstructions of the cervical spine were also generated. RADIATION DOSE REDUCTION: This exam was performed according to the departmental dose-optimization program which includes automated exposure control, adjustment of the mA and/or kV according to patient size and/or use of iterative reconstruction technique. COMPARISON:  None Available. FINDINGS: CT HEAD FINDINGS Brain: No evidence of acute infarction, hemorrhage, hydrocephalus, extra-axial collection or mass lesion/mass effect. Sequela of moderate chronic microvascular ischemic change. Generalized volume loss. Vascular: No hyperdense vessel or unexpected calcification. Skull: Normal. Negative for fracture or focal lesion. Sinuses/Orbits: No middle ear or mastoid effusion. Paranasal sinuses are clear. Bilateral lens replacement. Orbits are otherwise unremarkable. Other: None. CT CERVICAL SPINE FINDINGS Alignment: Trace anterolisthesis of C3 on C4 and C4 on C5. Skull base and vertebrae: No acute fracture. No primary bone lesion or focal pathologic process. Soft tissues and spinal canal:  No prevertebral fluid or swelling. No visible canal hematoma. Disc levels:  No evidence of high-grade spinal canal stenosis. Upper chest: There is nonspecific soft tissue density mass of the right lung apex (series 3, image 14). Other: None IMPRESSION: 1. No acute intracranial abnormality. 2. No acute fracture or traumatic subluxation of the cervical spine. 3. Nonspecific soft tissue density at the right lung apex. This is incompletely assessed due to respiratory motion artifact. Consider further evaluation with a chest CT. Electronically Signed   By: Lorenza Cambridge M.D.   On: 09/30/2022 13:44   CT ABDOMEN PELVIS WO CONTRAST  Result Date: 09/30/2022 CLINICAL DATA:  Fall at home last night. On anticoagulation. Abdominal and back pain. EXAM: CT ABDOMEN AND PELVIS WITHOUT CONTRAST TECHNIQUE: Multidetector CT imaging of the abdomen and pelvis was performed  following the standard protocol without IV contrast. RADIATION DOSE REDUCTION: This exam was performed according to the departmental dose-optimization program which includes automated exposure control, adjustment of the mA and/or kV according to patient size and/or use of iterative reconstruction technique. COMPARISON:  11/04/2021 FINDINGS: Lower chest: New small bilateral pleural effusions. Stable cardiomegaly and small pericardial effusion. Hepatobiliary: No hepatic parenchymal injury or mass identified on this noncontrast exam. Small cyst in the lateral segment of the left hepatic lobe remains stable. Gallbladder is unremarkable. No evidence of biliary ductal dilatation. Pancreas: No parenchymal abnormality identified on this noncontrast exam. Spleen: No evidence of parenchymal injury on this noncontrast exam. Adrenal/Urinary Tract: No hemorrhage or parenchymal injury identified on this noncontrast exam. Stomach/Bowel: Diverticulosis is seen mainly involving the sigmoid colon, however there is no evidence of diverticulitis. No focal inflammatory process identified. Diffuse mesenteric and body wall edema new since previous study. Mild low-attenuation ascites is also new. Vascular/Lymphatic: No evidence of retroperitoneal hemorrhage. No pathologically enlarged lymph nodes identified. Reproductive: Several small calcified uterine fibroids are again noted largest measuring approximately 1 cm. Other: Small left inguinal hernia containing small amount of fluid remains stable. Musculoskeletal: No acute fractures or suspicious bone lesions identified. Old T12 vertebral body compression fracture is unchanged. IMPRESSION: No definite acute traumatic injury identified on this noncontrast exam. New anasarca with mild low-attenuation ascites. Colonic diverticulosis, without radiographic evidence of diverticulitis. Stable small calcified uterine fibroids. Stable small left inguinal hernia containing small amount of fluid.  Stable cardiomegaly and small pericardial effusion. Electronically Signed   By: Danae Orleans M.D.   On: 09/30/2022 13:32   DG Chest Portable 1 View  Result Date: 09/30/2022 CLINICAL DATA:  altered mental status.  Increased ankle swelling. EXAM: PORTABLE CHEST 1 VIEW COMPARISON:  09/23/2022. FINDINGS: Bilateral lung fields are clear. There is blunting of left lateral costophrenic angle, which may represent underlying trace pleural effusion. Right lateral costophrenic angle is clear. Stable cardio-mediastinal silhouette. No acute osseous abnormalities. There is a left sided 2-lead pacemaker. The soft tissues are within normal limits. Redemonstration of metallic staples along the right axillary region. IMPRESSION: Possible trace left pleural effusion. No acute cardiopulmonary abnormality. Electronically Signed   By: Jules Schick M.D.   On: 09/30/2022 11:53        Scheduled Meds:  apixaban  2.5 mg Oral BID   fluconazole  100 mg Oral Daily   levothyroxine  50 mcg Oral Q0600   mometasone-formoterol  2 puff Inhalation BID   pantoprazole  40 mg Oral Daily   potassium chloride  40 mEq Oral BID   Continuous Infusions:  cefTRIAXone (ROCEPHIN)  IV 1 g (10/01/22 1211)     LOS:  0 days    Time spent: 35 minutes.    Dorcas Carrow, MD [facility]

## 2022-10-01 NOTE — Progress Notes (Signed)
  Echocardiogram 2D Echocardiogram has been performed.  Milda Smart 10/01/2022, 3:00 PM

## 2022-10-01 NOTE — Progress Notes (Signed)
Orthopedic Tech Progress Note Patient Details:  Alyssa [name] May 07, 1924 [id number] 20-30 mmHg knee high compression socks have been ordered from [facility] RN notified.  Patient ID: Alyssa [name], female   DOB: 11/09/1924, 89 y.o.   MRN: [id number]  [name] 10/01/2022, 3:05 PM

## 2022-10-02 DIAGNOSIS — E871 Hypo-osmolality and hyponatremia: Secondary | ICD-10-CM | POA: Diagnosis not present

## 2022-10-02 LAB — CBC WITH DIFFERENTIAL/PLATELET
Abs Immature Granulocytes: 0.03 10*3/uL (ref 0.00–0.07)
Basophils Absolute: 0 10*3/uL (ref 0.0–0.1)
Basophils Relative: 0 %
Eosinophils Absolute: 0 10*3/uL (ref 0.0–0.5)
Eosinophils Relative: 0 %
HCT: 34.4 % — ABNORMAL LOW (ref 36.0–46.0)
Hemoglobin: 11.5 g/dL — ABNORMAL LOW (ref 12.0–15.0)
Immature Granulocytes: 0 %
Lymphocytes Relative: 20 %
Lymphs Abs: 1.4 10*3/uL (ref 0.7–4.0)
MCH: 32.2 pg (ref 26.0–34.0)
MCHC: 33.4 g/dL (ref 30.0–36.0)
MCV: 96.4 fL (ref 80.0–100.0)
Monocytes Absolute: 0.5 10*3/uL (ref 0.1–1.0)
Monocytes Relative: 7 %
Neutro Abs: 5 10*3/uL (ref 1.7–7.7)
Neutrophils Relative %: 73 %
Platelets: 185 10*3/uL (ref 150–400)
RBC: 3.57 MIL/uL — ABNORMAL LOW (ref 3.87–5.11)
RDW: 11.8 % (ref 11.5–15.5)
WBC: 7 10*3/uL (ref 4.0–10.5)
nRBC: 0 % (ref 0.0–0.2)

## 2022-10-02 LAB — COMPREHENSIVE METABOLIC PANEL
ALT: 31 U/L (ref 0–44)
AST: 34 U/L (ref 15–41)
Albumin: 3.4 g/dL — ABNORMAL LOW (ref 3.5–5.0)
Alkaline Phosphatase: 52 U/L (ref 38–126)
Anion gap: 8 (ref 5–15)
BUN: 12 mg/dL (ref 8–23)
CO2: 26 mmol/L (ref 22–32)
Calcium: 8.2 mg/dL — ABNORMAL LOW (ref 8.9–10.3)
Chloride: 96 mmol/L — ABNORMAL LOW (ref 98–111)
Creatinine, Ser: 1.02 mg/dL — ABNORMAL HIGH (ref 0.44–1.00)
GFR, Estimated: 50 mL/min — ABNORMAL LOW (ref >60)
Glucose, Bld: 112 mg/dL — ABNORMAL HIGH (ref 70–99)
Potassium: 4.4 mmol/L (ref 3.5–5.1)
Sodium: 130 mmol/L — ABNORMAL LOW (ref 135–145)
Total Bilirubin: 0.8 mg/dL (ref 0.3–1.2)
Total Protein: 5.5 g/dL — ABNORMAL LOW (ref 6.5–8.1)

## 2022-10-02 LAB — MAGNESIUM: Magnesium: 2.2 mg/dL (ref 1.7–2.4)

## 2022-10-02 NOTE — Progress Notes (Signed)
PROGRESS NOTE  Alyssa [name]  DOB: 06-Jun-1924  PCP: [name], MD UJW:[id number]  DOA: 09/30/2022  LOS: 1 day  Hospital Day: 3  Brief narrative: Alyssa [name] is a 89 y.o. female with PMH significant for HTN, chronic A-fib s/p PPM on Eliquis, CKD, h/o breast cancer, GERD, hypothyroidism, anxiety, IBS 8/22, patient was brought to the ED after a mechanical fall. Apparently she was fixing her bed, she had multiple pillows on her foot side to elevate her edematous leg. At least 6 of the pillows fell on her pushing her back and falling backward into the carpeted floor. She had some headache.  In the emergency room, hemodynamically stable. On room air.  Skeletal survey negative.  Sodium was low at 121, chloride 89.  Admitted due to fall, hyponatremia.   Subjective: Patient was seen and examined this afternoon.  Pleasant elderly Caucasian female.  Lying on bed.  Not in distress.  Daughter at bedside. Chart reviewed In the last 24 hours, remains afebrile, hemodynamically stable, breathing on room air Last set of blood work from this morning with sodium rising up to 130 4.  By Destry Dauber 48 Assessment and plan: Hypovolemic hyponatremia Presented with low serum sodium of 121, calculated low serum osmolality of 276, low urine osmolality of 184.  Suggestive of hypovolemic hyponatremia due to low sodium and water intake. Patient was given IV fluid with adequate response. Currently off IV fluid.  On regular diet.  Sodium level gradually improving.  Continue to monitor Recent Labs  Lab 09/30/22 1124 10/01/22 0357 10/02/22 0434  NA 121* 128* 130*    Hypokalemia Potassium Level Improved with replacement. Recent Labs  Lab 09/30/22 1124 10/01/22 0357 10/02/22 0434  K 3.6 3.2* 4.4  MG  --   --  2.2   CKD stage IIIa Creatinine stable. Recent Labs    10/20/21 1118 07/08/22 1554 09/09/22 1513 09/23/22 1815 09/24/22 0536 09/25/22 0658 09/30/22 1124 10/01/22 0357  10/02/22 0434  BUN 29* 29* 34* 18 13 10 13 11 12   CREATININE 1.16 1.16 1.17 1.03* 0.85 0.95 0.90 0.92 1.02*   Fall with deconditioning PT eval obtained.  SNF recommended.  TOC consulted.   Lower extremity cellulitis  chronic lower extremity lymphedema Echocardiogram with EF 55%, severely dilated both atriums, moderately elevated pulm artery pressure PTA on Lasix 40 mg daily I would avoid diuresis for now given her low solute intake, risk of dehydration and risk of worsening of hyponatremia, Apply Ace wrap on both legs.  Continue leg elevation. On empiric antibiotics for lower extremity cellulitis.  Permanent A-fib rate controlled.  Therapeutic on Eliquis.   Hypothyroidism on Synthroid 50 mcg daily  GERD on PPI  Mild intermittent asthma Continue bronchodilator as needed.  Goals of care   Code Status: DNR  Patient was living alone.  Family had a plan to move her to an independent living facility nearby her daughter's town in [place] on 7/19.  Patient seems weak for discharge to home.  Therapy was to see if she gets approved for SNF.  TOC consulted.   DVT prophylaxis:  apixaban (ELIQUIS) tablet 2.5 mg Start: 09/30/22 2200 apixaban (ELIQUIS) tablet 2.5 mg    Antimicrobials: IV Rocephin Fluid: None currently. Consultants: None Family Communication: Daughter at bedside you are  Status: Inpatient Level of care:  Med-Surg   Patient is from: Home  Needs to continue in-hospital care: Improving sodium level.  Continue to monitor Anticipated d/c to: SNF versus home.    Diet:  Diet Order             Diet regular Fluid consistency: Thin  Diet effective now                   Scheduled Meds:  apixaban  2.5 mg Oral BID   fluconazole  100 mg Oral Daily   levothyroxine  50 mcg Oral Q0600   mometasone-formoterol  2 puff Inhalation BID   pantoprazole  40 mg Oral Daily   potassium chloride  40 mEq Oral BID    PRN meds: acetaminophen **OR** acetaminophen,  albuterol, alum & mag hydroxide-simeth, diazepam, ondansetron **OR** ondansetron (ZOFRAN) IV   Infusions:   cefTRIAXone (ROCEPHIN)  IV 1 g (10/02/22 1505)    Antimicrobials: Anti-infectives (From admission, onward)    Start     Dose/Rate Route Frequency Ordered Stop   10/01/22 1200  cefTRIAXone (ROCEPHIN) 1 g in sodium chloride 0.9 % 100 mL IVPB        1 g 200 mL/hr over 30 Minutes Intravenous Every 24 hours 09/30/22 1432 10/08/22 1159   10/01/22 1000  fluconazole (DIFLUCAN) tablet 100 mg        100 mg Oral Daily 09/30/22 1808 10/08/22 0959   09/30/22 1300  cefTRIAXone (ROCEPHIN) 1 g in sodium chloride 0.9 % 100 mL IVPB        1 g 200 mL/hr over 30 Minutes Intravenous  Once 09/30/22 1259 09/30/22 1505       Nutritional status:  Body mass index is 16.62 kg/m.          Objective: Vitals:   10/02/22 0906 10/02/22 1233  BP:  134/64  Pulse:  62  Resp:  18  Temp:  98.4 F (36.9 C)  SpO2: 94% 96%    Intake/Output Summary (Last 24 hours) at 10/02/2022 1648 Last data filed at 10/02/2022 1600 Gross per 24 hour  Intake 240 ml  Output 1550 ml  Net -1310 ml   Filed Weights   09/30/22 1055  Weight: 46.7 kg   Weight change:  Body mass index is 16.62 kg/m.   Physical Exam: General exam: Pleasant elderly Caucasian female. Skin: No rashes, lesions or ulcers. HEENT: Atraumatic, normocephalic, no obvious bleeding Lungs: Clear to auscultation bilaterally CVS: Regular rate and rhythm, no murmur GI/Abd soft, nontender, nondistended, bowel sound present CNS: Alert, awake, oriented x 3.  Mild hard of hearing Psychiatry: Mood appropriate Extremities: 1+ bilateral pedal edema, no calf tenderness  Data Review: I have personally reviewed the laboratory data and studies available.  F/u labs  Unresulted Labs (From admission, onward)     Start     Ordered   09/30/22 1807  C Difficile Quick Screen w PCR reflex  (C Difficile quick screen w PCR reflex panel )  Once, for 24 hours,    TIMED       References:    CDiff Information Tool   09/30/22 1806            Total time spent in review of labs and imaging, patient evaluation, formulation of plan, documentation and communication with family: 45 minutes  Signed, Lorin Glass, MD [facility] 10/02/2022

## 2022-10-02 NOTE — Progress Notes (Signed)
Knee high 20-30 mmHg compression stocking ordered but not available per materials. Knee high TED hose applied instead.

## 2022-10-02 NOTE — Progress Notes (Signed)
Mobility Specialist - Progress Note   10/02/22 0906  Mobility  Activity Ambulated with assistance in hallway  Level of Assistance Contact guard assist, steadying assist  Assistive Device Front wheel walker  Distance Ambulated (ft) 350 ft  Range of Motion/Exercises Active  Activity Response Tolerated well  Mobility Referral Yes  $Mobility charge 1 Mobility  Mobility Specialist Start Time (ACUTE ONLY) 0850  Mobility Specialist Stop Time (ACUTE ONLY) 0906  Mobility Specialist Time Calculation (min) (ACUTE ONLY) 16 min   Pt was found in bed and agreeable to ambulate. Had x1 cross step when going to turn right. Pt stated feeling more stable with RW. At EOS returned to bed with all needs met. Call bell in reach.  Billey Chang Mobility Specialist

## 2022-10-03 DIAGNOSIS — E871 Hypo-osmolality and hyponatremia: Secondary | ICD-10-CM | POA: Diagnosis not present

## 2022-10-03 NOTE — Plan of Care (Signed)
  Problem: Skin Integrity: Goal: Skin integrity will improve Outcome: Progressing   Problem: Clinical Measurements: Goal: Diagnostic test results will improve Outcome: Progressing   Problem: Activity: Goal: Risk for activity intolerance will decrease 10/03/2022 2058 by Dwyane Dee, RN Outcome: Progressing 10/03/2022 1651 by Dwyane Dee, RN Outcome: Progressing   Problem: Nutrition: Goal: Adequate nutrition will be maintained 10/03/2022 2058 by Dwyane Dee, RN Outcome: Progressing 10/03/2022 1651 by Dwyane Dee, RN Outcome: Progressing   Problem: Safety: Goal: Ability to remain free from injury will improve 10/03/2022 2058 by Dwyane Dee, RN Outcome: Progressing 10/03/2022 1651 by Dwyane Dee, RN Outcome: Progressing   Problem: Skin Integrity: Goal: Risk for impaired skin integrity will decrease Outcome: Progressing

## 2022-10-03 NOTE — TOC Initial Note (Signed)
Transition of Care [facility]) - Initial/Assessment Note    Patient Details  Name: Alyssa [name] MRN: [id number] Date of Birth: 09-13-1924  Transition of Care [facility]) CM/SW Contact:    Carmina Miller, LCSWA Phone Number: 10/03/2022, 9:57 AM  Clinical Narrative:                  CSW spoke with pt's daughter Cala Bradford, explained SNF recs, she is agreeable, she states she plans on moving pt to [place] where she leaves once pt is done with rehab. CSW explained Medicare.gov ratings site and insurance auth process, no further questions asked. TOC will continue to follow.        Patient Goals and CMS Choice            Expected Discharge Plan and Services                                              Prior Living Arrangements/Services                       Activities of Daily Living Home Assistive Devices/Equipment: Hearing aid, Dan Humphreys (specify type) ADL Screening (condition at time of admission) Patient's cognitive ability adequate to safely complete daily activities?: Yes Is the patient deaf or have difficulty hearing?: Yes Does the patient have difficulty seeing, even when wearing glasses/contacts?: No Does the patient have difficulty concentrating, remembering, or making decisions?: No Patient able to express need for assistance with ADLs?: No Does the patient have difficulty dressing or bathing?: No Independently performs ADLs?: Yes (appropriate for developmental age) Does the patient have difficulty walking or climbing stairs?: No Weakness of Legs: Both Weakness of Arms/Hands: None  Permission Sought/Granted                  Emotional Assessment              Admission diagnosis:  Hyponatremia [E87.1] Fall, initial encounter [W19.XXXA] Patient Active Problem List   Diagnosis Date Noted   Dysphagia 09/23/2022   Hypokalemia 09/23/2022   Chronic kidney disease, stage 4, severely decreased GFR (HCC) 07/08/2022   Mild asthma 04/08/2022    Direct inguinal hernia of left side 07/28/2021   Intestinal bacterial overgrowth 07/13/2021   Encounter for general adult medical examination with abnormal findings 02/25/2021   Iron deficiency anemia due to chronic blood loss 03/19/2020   Hypoproteinemia (HCC) 03/13/2020   Chronic right-sided low back pain without sciatica 05/09/2019   Fracture of vertebra due to osteoporosis (HCC) 12/28/2018   IBS (irritable bowel syndrome) 02/20/2018   Hyponatremia 02/20/2018   Seasonal allergic rhinitis due to pollen 11/25/2016   (HFpEF) heart failure with preserved ejection fraction (HCC) 08/13/2016   GAD (generalized anxiety disorder) 02/18/2016   Essential hypertension, benign 09/04/2015   Severe tricuspid regurgitation 04/22/2015   Complete heart block (HCC) 02/26/2015   Permanent atrial fibrillation (HCC) 10/11/2012   Chronic kidney disease, stage 3b (HCC) 06/12/2012   Pacemaker-dual chamber  06/23/2010   GERD 06/10/2009   Hypothyroidism 02/26/2008   OSTEOPOROSIS 02/26/2008   PCP:  [name], MD Pharmacy:  No Pharmacies Listed    Social Determinants of Health (SDOH) Social History: SDOH Screenings   Food Insecurity: No Food Insecurity (09/30/2022)  Housing: Low Risk  (09/30/2022)  Transportation Needs: No Transportation Needs (09/30/2022)  Utilities: Not At Risk (09/30/2022)  Alcohol  Screen: Low Risk  (12/18/2021)  Depression (PHQ2-9): Low Risk  (12/18/2021)  Financial Resource Strain: Low Risk  (12/18/2021)  Physical Activity: Sufficiently Active (12/18/2021)  Social Connections: Moderately Integrated (12/18/2021)  Stress: No Stress Concern Present (12/18/2021)  Tobacco Use: Medium Risk (09/30/2022)   SDOH Interventions:     Readmission Risk Interventions     No data to display

## 2022-10-03 NOTE — Plan of Care (Signed)
  Problem: Clinical Measurements: Goal: Respiratory complications will improve Outcome: Progressing Goal: Cardiovascular complication will be avoided Outcome: Progressing   Problem: Pain Managment: Goal: General experience of comfort will improve Outcome: Progressing   Problem: Safety: Goal: Ability to remain free from injury will improve Outcome: Progressing   

## 2022-10-03 NOTE — TOC Progression Note (Signed)
Transition of Care [facility]) - Progression Note    Patient Details  Name: Alyssa [name] MRN: [id number] Date of Birth: [birth year]  Transition [facility] Care [facility]) CM/SW Contact  Carmina Miller, [place] Phone Number: 10/03/2022, 10:08 AM  Clinical Narrative:     RE: Alyssa [name] Date of Birth: August 25, 2024 Date: 10/03/22  Please be advised that the above-named patient will require a short-term nursing home stay - anticipated 30 days or less for rehabilitation and strengthening.  The plan is for return home.        Expected Discharge Plan and Services                                               Social Determinants of Health (SDOH) Interventions SDOH Screenings   Food Insecurity: No Food Insecurity (09/30/2022)  Housing: Low Risk  (09/30/2022)  Transportation Needs: No Transportation Needs (09/30/2022)  Utilities: Not At Risk (09/30/2022)  Alcohol Screen: Low Risk  (12/18/2021)  Depression (PHQ2-9): Low Risk  (12/18/2021)  Financial Resource Strain: Low Risk  (12/18/2021)  Physical Activity: Sufficiently Active (12/18/2021)  Social Connections: Moderately Integrated (12/18/2021)  Stress: No Stress Concern Present (12/18/2021)  Tobacco Use: Medium Risk (09/30/2022)    Readmission Risk Interventions     No data to display

## 2022-10-03 NOTE — Plan of Care (Signed)
  Problem: Skin Integrity: Goal: Skin integrity will improve Outcome: Progressing   Problem: Activity: Goal: Risk for activity intolerance will decrease Outcome: Progressing   Problem: Nutrition: Goal: Adequate nutrition will be maintained Outcome: Progressing   Problem: Safety: Goal: Ability to remain free from injury will improve Outcome: Progressing   Problem: Skin Integrity: Goal: Risk for impaired skin integrity will decrease Outcome: Progressing

## 2022-10-03 NOTE — Progress Notes (Signed)
PROGRESS NOTE  Alyssa [name]  DOB: [birth year]  PCP: [name], MD ZOX:[id number]  DOA: 09/30/2022  LOS: 2 days  Hospital Day: 4  Brief narrative: Alyssa [name] is a 89 y.o. female with PMH significant for HTN, chronic A-fib s/p PPM on Eliquis, CKD, h/o breast cancer, GERD, hypothyroidism, anxiety, IBS 8/22, patient was brought to the ED after a mechanical fall. Apparently she was fixing her bed, she had multiple pillows on her foot side to elevate her edematous leg. At least 6 of the pillows fell on her pushing her back and falling backward into the carpeted floor. She had some headache.  In the emergency room, hemodynamically stable. On room air.  Skeletal survey negative.  Sodium was low at 121, chloride 89.  Admitted due to fall, hyponatremia.   Subjective: Patient was seen and examined this morning.  Pleasant elderly Caucasian female.  Sitting up in recliner.  Not in distress.  Daughter not at bedside.  No new symptoms. No labs this morning.  Assessment and plan: Hypovolemic hyponatremia Presented with low serum sodium of 121, calculated low serum osmolality of 276, low urine osmolality of 184.  Suggestive of hypovolemic hyponatremia due to low sodium and water intake. Patient was given IV fluid with adequate response. Currently off IV fluid.  On regular diet.  Sodium level gradually improving.  Last blood work yesterday showed sodium level of 130.  Repeat labs tomorrow Recent Labs  Lab 09/30/22 1124 10/01/22 0357 10/02/22 0434  NA 121* 128* 130*    Hypokalemia Potassium Level Improved with replacement. Recent Labs  Lab 09/30/22 1124 10/01/22 0357 10/02/22 0434  K 3.6 3.2* 4.4  MG  --   --  2.2   CKD stage IIIa Creatinine stable. Recent Labs    10/20/21 1118 07/08/22 1554 09/09/22 1513 09/23/22 1815 09/24/22 0536 09/25/22 0658 09/30/22 1124 10/01/22 0357 10/02/22 0434  BUN 29* 29* 34* 18 13 10 13 11 12   CREATININE 1.16 1.16 1.17 1.03* 0.85  0.95 0.90 0.92 1.02*   Fall with deconditioning PT eval obtained.  SNF recommended.  TOC consulted.  SNF in process   Lower extremity cellulitis  chronic lower extremity lymphedema Echocardiogram with EF 55%, severely dilated both atriums, moderately elevated pulm artery pressure PTA on Lasix 40 mg daily I would avoid diuresis for now given her low solute intake, risk of dehydration and risk of worsening of hyponatremia, Continue Ace wrap on both legs.  Continue leg elevation. Continue empiric antibiotics for lower extremity cellulitis.  Permanent A-fib rate controlled.  Therapeutic on Eliquis.   Hypothyroidism on Synthroid 50 mcg daily  GERD on PPI  Mild intermittent asthma Continue bronchodilator as needed.  Goals of care   Code Status: DNR  Patient was living alone.  Family had a plan to move her to an independent living facility nearby her daughter's town in [place] on 7/19.  Patient seems weak for discharge to home.  Therapy was to see if she gets approved for SNF.  TOC consulted.  Pending SNF approval by insurance   DVT prophylaxis:  apixaban (ELIQUIS) tablet 2.5 mg Start: 09/30/22 2200 apixaban (ELIQUIS) tablet 2.5 mg    Antimicrobials: IV Rocephin Fluid: None currently. Consultants: None Family Communication: Daughter not at bedside today  Status: Inpatient Level of care:  Med-Surg   Patient is from: Home  Needs to continue in-hospital care: Improving sodium level.  Continue to monitor Anticipated d/c to: SNF likely    Diet:  Diet Order  Diet regular Fluid consistency: Thin  Diet effective now                   Scheduled Meds:  apixaban  2.5 mg Oral BID   fluconazole  100 mg Oral Daily   levothyroxine  50 mcg Oral Q0600   mometasone-formoterol  2 puff Inhalation BID   pantoprazole  40 mg Oral Daily    PRN meds: acetaminophen **OR** acetaminophen, albuterol, alum & mag hydroxide-simeth, diazepam, ondansetron **OR** ondansetron  (ZOFRAN) IV   Infusions:   cefTRIAXone (ROCEPHIN)  IV 1 g (10/02/22 1505)    Antimicrobials: Anti-infectives (From admission, onward)    Start     Dose/Rate Route Frequency Ordered Stop   10/01/22 1200  cefTRIAXone (ROCEPHIN) 1 g in sodium chloride 0.9 % 100 mL IVPB        1 g 200 mL/hr over 30 Minutes Intravenous Every 24 hours 09/30/22 1432 10/08/22 1159   10/01/22 1000  fluconazole (DIFLUCAN) tablet 100 mg        100 mg Oral Daily 09/30/22 1808 10/08/22 0959   09/30/22 1300  cefTRIAXone (ROCEPHIN) 1 g in sodium chloride 0.9 % 100 mL IVPB        1 g 200 mL/hr over 30 Minutes Intravenous  Once 09/30/22 1259 09/30/22 1505       Nutritional status:  Body mass index is 16.62 kg/m.          Objective: Vitals:   10/02/22 2118 10/03/22 0321  BP: (!) 154/70 120/74  Pulse:  61  Resp:  18  Temp:  98.2 F (36.8 C)  SpO2:  91%    Intake/Output Summary (Last 24 hours) at 10/03/2022 1103 Last data filed at 10/03/2022 0836 Gross per 24 hour  Intake 240 ml  Output 400 ml  Net -160 ml   Filed Weights   09/30/22 1055  Weight: 46.7 kg   Weight change:  Body mass index is 16.62 kg/m.   Physical Exam: General exam: Pleasant elderly Caucasian female. Skin: No rashes, lesions or ulcers. HEENT: Atraumatic, normocephalic, no obvious bleeding Lungs: Clear to auscultation bilaterally CVS: Regular rate and rhythm, no murmur GI/Abd soft, nontender, nondistended, bowel sound present CNS: Alert, awake, oriented x 3.  Mild hard of hearing Psychiatry: Mood appropriate Extremities: 1+ bilateral pedal edema, no calf tenderness  Data Review: I have personally reviewed the laboratory data and studies available.  F/u labs  Unresulted Labs (From admission, onward)     Start     Ordered   10/04/22 0500  CBC with Differential/Platelet  Tomorrow morning,   R       Question:  Specimen collection method  Answer:  Lab=Lab collect   10/03/22 0751   10/04/22 0500  Basic metabolic panel   Tomorrow morning,   R       Question:  Specimen collection method  Answer:  Lab=Lab collect   10/03/22 0751   09/30/22 1807  C Difficile Quick Screen w PCR reflex  (C Difficile quick screen w PCR reflex panel )  Once, for 24 hours,   TIMED       References:    CDiff Information Tool   09/30/22 1806            Total time spent in review of labs and imaging, patient evaluation, formulation of plan, documentation and communication with family: 25 minutes  Signed, Lorin Glass, MD [facility] 10/03/2022

## 2022-10-03 NOTE — Progress Notes (Signed)
Mobility Specialist - Progress Note   10/03/22 1122  Mobility  Activity Ambulated with assistance in hallway  Level of Assistance Contact guard assist, steadying assist  Assistive Device Front wheel walker  Distance Ambulated (ft) 250 ft  Range of Motion/Exercises Active  Activity Response Tolerated well  Mobility Referral Yes  $Mobility charge 1 Mobility  Mobility Specialist Start Time (ACUTE ONLY) 1110  Mobility Specialist Stop Time (ACUTE ONLY) 1122  Mobility Specialist Time Calculation (min) (ACUTE ONLY) 12 min   Pt received in chair and agreed to mobility. Had no issues throughout session. Pt returned to chair with all needs met. Alarm on.  Marilynne Halsted Mobility Specialist

## 2022-10-03 NOTE — NC FL2 (Signed)
MEDICAID FL2 LEVEL OF CARE FORM     IDENTIFICATION  Patient Name: Alyssa [name] Birthdate: 08-12-1924 Sex: female Admission Date (Current Location): 09/30/2022  [facility] and [place] Number:  Producer, television/film/video and Address:  [facility], [address] [place], [place] [address]      Provider Number: [id number]  Attending Physician Name and Address:  Lorin Glass, MD  Relative Name and Phone Number:       Current Level of Care: Hospital Recommended Level of Care: Skilled Nursing Facility Prior Approval Number:    Date Approved/Denied:   PASRR Number: PENDING  Discharge Plan: SNF    Current Diagnoses: Patient Active Problem List   Diagnosis Date Noted   Dysphagia 09/23/2022   Hypokalemia 09/23/2022   Chronic kidney disease, stage 4, severely decreased GFR (HCC) 07/08/2022   Mild asthma 04/08/2022   Direct inguinal hernia of left side 07/28/2021   Intestinal bacterial overgrowth 07/13/2021   Encounter for general adult medical examination with abnormal findings 02/25/2021   Iron deficiency anemia due to chronic blood loss 03/19/2020   Hypoproteinemia (HCC) 03/13/2020   Chronic right-sided low back pain without sciatica 05/09/2019   Fracture of vertebra due to osteoporosis (HCC) 12/28/2018   IBS (irritable bowel syndrome) 02/20/2018   Hyponatremia 02/20/2018   Seasonal allergic rhinitis due to pollen 11/25/2016   (HFpEF) heart failure with preserved ejection fraction (HCC) 08/13/2016   GAD (generalized anxiety disorder) 02/18/2016   Essential hypertension, benign 09/04/2015   Severe tricuspid regurgitation 04/22/2015   Complete heart block (HCC) 02/26/2015   Permanent atrial fibrillation (HCC) 10/11/2012   Chronic kidney disease, stage 3b (HCC) 06/12/2012   Pacemaker-dual chamber  06/23/2010   GERD 06/10/2009   Hypothyroidism 02/26/2008   OSTEOPOROSIS 02/26/2008    Orientation RESPIRATION BLADDER Height & Weight      Self, Time, Situation, Place  Normal Continent Weight: 103 lb (46.7 kg) Height:  5\' 6"  (167.6 cm)  BEHAVIORAL SYMPTOMS/MOOD NEUROLOGICAL BOWEL NUTRITION STATUS      Continent Diet (see dc summary)  AMBULATORY STATUS COMMUNICATION OF NEEDS Skin   Extensive Assist Verbally Normal                       Personal Care Assistance Level of Assistance  Bathing, Feeding, Dressing Bathing Assistance: Limited assistance Feeding assistance: Independent Dressing Assistance: Limited assistance     Functional Limitations Info  Sight, Hearing, Speech Sight Info: Impaired Hearing Info: Impaired Speech Info: Adequate    SPECIAL CARE FACTORS FREQUENCY  PT (By licensed PT), OT (By licensed OT)     PT Frequency: 5x week OT Frequency: 5x week            Contractures Contractures Info: Not present    Additional Factors Info  Code Status, Allergies Code Status Info: DNR Allergies Info: Penicillins           Current Medications (10/03/2022):  This is the current hospital active medication list Current Facility-Administered Medications  Medication Dose Route Frequency Provider Last Rate Last Admin   acetaminophen (TYLENOL) tablet 650 mg  650 mg Oral Q6H PRN Bobette Mo, MD   650 mg at 10/03/22 8119   Or   acetaminophen (TYLENOL) suppository 650 mg  650 mg Rectal Q6H PRN Bobette Mo, MD       albuterol (PROVENTIL) (2.5 MG/3ML) 0.083% nebulizer solution 3 mL  3 mL Inhalation Q6H PRN Bobette Mo, MD  alum & mag hydroxide-simeth (MAALOX/MYLANTA) 200-200-20 MG/5ML suspension 30 mL  30 mL Oral Q4H PRN Dorcas Carrow, MD   30 mL at 10/02/22 1146   apixaban (ELIQUIS) tablet 2.5 mg  2.5 mg Oral BID Bobette Mo, MD   2.5 mg at 10/02/22 2118   cefTRIAXone (ROCEPHIN) 1 g in sodium chloride 0.9 % 100 mL IVPB  1 g Intravenous Q24H Bobette Mo, MD 200 mL/hr at 10/02/22 1505 1 g at 10/02/22 1505   diazepam (VALIUM) tablet 2 mg  2 mg Oral Q8H PRN Bobette Mo, MD   2 mg at 10/02/22 2149   fluconazole (DIFLUCAN) tablet 100 mg  100 mg Oral Daily Bobette Mo, MD   100 mg at 10/02/22 1118   levothyroxine (SYNTHROID) tablet 50 mcg  50 mcg Oral Q0600 Bobette Mo, MD   50 mcg at 10/03/22 0601   mometasone-formoterol (DULERA) 100-5 MCG/ACT inhaler 2 puff  2 puff Inhalation BID Bobette Mo, MD   2 puff at 10/02/22 2029   ondansetron [facility]) tablet 4 mg  4 mg Oral Q6H PRN Bobette Mo, MD       Or   ondansetron [facility]) injection 4 mg  4 mg Intravenous Q6H PRN Bobette Mo, MD       pantoprazole (PROTONIX) EC tablet 40 mg  40 mg Oral Daily Bobette Mo, MD   40 mg at 10/02/22 1119     Discharge Medications: Please see discharge summary for a list of discharge medications.  Relevant Imaging Results:  Relevant Lab Results:   Additional Information SSN  [id number]  Carmina Miller, LCSWA

## 2022-10-04 DIAGNOSIS — E871 Hypo-osmolality and hyponatremia: Secondary | ICD-10-CM | POA: Diagnosis not present

## 2022-10-04 LAB — BASIC METABOLIC PANEL
Anion gap: 5 (ref 5–15)
BUN: 14 mg/dL (ref 8–23)
CO2: 25 mmol/L (ref 22–32)
Calcium: 7.9 mg/dL — ABNORMAL LOW (ref 8.9–10.3)
Chloride: 99 mmol/L (ref 98–111)
Creatinine, Ser: 0.88 mg/dL (ref 0.44–1.00)
GFR, Estimated: >60 mL/min (ref >60)
Glucose, Bld: 108 mg/dL — ABNORMAL HIGH (ref 70–99)
Potassium: 4.3 mmol/L (ref 3.5–5.1)
Sodium: 129 mmol/L — ABNORMAL LOW (ref 135–145)

## 2022-10-04 LAB — CBC WITH DIFFERENTIAL/PLATELET
Abs Immature Granulocytes: 0.01 10*3/uL (ref 0.00–0.07)
Basophils Absolute: 0 10*3/uL (ref 0.0–0.1)
Basophils Relative: 0 %
Eosinophils Absolute: 0.1 10*3/uL (ref 0.0–0.5)
Eosinophils Relative: 2 %
HCT: 30.5 % — ABNORMAL LOW (ref 36.0–46.0)
Hemoglobin: 10.2 g/dL — ABNORMAL LOW (ref 12.0–15.0)
Immature Granulocytes: 0 %
Lymphocytes Relative: 28 %
Lymphs Abs: 1.1 10*3/uL (ref 0.7–4.0)
MCH: 32.6 pg (ref 26.0–34.0)
MCHC: 33.4 g/dL (ref 30.0–36.0)
MCV: 97.4 fL (ref 80.0–100.0)
Monocytes Absolute: 0.3 10*3/uL (ref 0.1–1.0)
Monocytes Relative: 7 %
Neutro Abs: 2.4 10*3/uL (ref 1.7–7.7)
Neutrophils Relative %: 63 %
Platelets: 153 10*3/uL (ref 150–400)
RBC: 3.13 MIL/uL — ABNORMAL LOW (ref 3.87–5.11)
RDW: 11.9 % (ref 11.5–15.5)
WBC: 3.8 10*3/uL — ABNORMAL LOW (ref 4.0–10.5)
nRBC: 0 % (ref 0.0–0.2)

## 2022-10-04 MED ORDER — SENNA 8.6 MG PO TABS
1.0000 | ORAL_TABLET | Freq: Every day | ORAL | Status: DC
  Administered 2022-10-04 – 2022-10-05 (×2): 8.6 mg via ORAL
  Filled 2022-10-04 (×2): qty 1

## 2022-10-04 MED ORDER — POLYETHYLENE GLYCOL 3350 17 G PO PACK
17.0000 g | PACK | Freq: Every day | ORAL | Status: DC | PRN
  Administered 2022-10-04: 17 g via ORAL
  Filled 2022-10-04: qty 1

## 2022-10-04 MED ORDER — HYDRALAZINE HCL 20 MG/ML IJ SOLN
10.0000 mg | Freq: Four times a day (QID) | INTRAMUSCULAR | Status: DC | PRN
  Administered 2022-10-04 – 2022-10-05 (×2): 10 mg via INTRAVENOUS
  Filled 2022-10-04 (×2): qty 1

## 2022-10-04 NOTE — TOC Progression Note (Signed)
Transition of Care [facility]) - Progression Note    Patient Details  Name: Alyssa [name] MRN: [id number] Date of Birth: 03/25/1924  Transition [facility]) CM/SW Contact  Larrie Kass, LCSW Phone Number: 10/04/2022, 9:32 AM  Clinical Narrative:    Pt's PASRR  pending clinicals uploaded.   1:40pm CSW met with pt and her daughter kim to present bed offers. Pt has chosen [facility] for SNF placement , CSW to start insurance authorization. Pt's PASRR still pending. TOC to follow   Expected Discharge Plan: Skilled Nursing Facility Barriers to Discharge: Continued Medical Work up  Expected Discharge Plan and Services In-house Referral: Clinical Social Work     Living arrangements for the past 2 months: Apartment                                       Social Determinants of Health (SDOH) Interventions SDOH Screenings   Food Insecurity: No Food Insecurity (09/30/2022)  Housing: Low Risk  (09/30/2022)  Transportation Needs: No Transportation Needs (09/30/2022)  Utilities: Not At Risk (09/30/2022)  Alcohol Screen: Low Risk  (12/18/2021)  Depression (PHQ2-9): Low Risk  (12/18/2021)  Financial Resource Strain: Low Risk  (12/18/2021)  Physical Activity: Sufficiently Active (12/18/2021)  Social Connections: Moderately Integrated (12/18/2021)  Stress: No Stress Concern Present (12/18/2021)  Tobacco Use: Medium Risk (09/30/2022)    Readmission Risk Interventions     No data to display

## 2022-10-04 NOTE — TOC Progression Note (Incomplete Revision)
Transition of Care [facility]) - Progression Note    Patient Details  Name: Alyssa [name] MRN: [id number] Date of Birth: 05-21-1924  Transition [facility]) CM/SW Contact  Larrie Kass, LCSW Phone Number: 10/04/2022, 9:32 AM  Clinical Narrative:    Pt's PASRR  pending clinicals uploaded.   1:40pm CSW met with pt and her daughter kim to present bed offers. Pt has chosen [facility] for SNF placement , CSW to start insurance authorization. Pt's PASRR still pending. TOC to follow   [place]  12:15pm After chart review pt Expected Discharge Plan: Skilled Nursing Facility Barriers to Discharge: Continued Medical Work up  Expected Discharge Plan and Services In-house Referral: Clinical Social Work     Living arrangements for the past 2 months: Apartment                                       Social Determinants of Health (SDOH) Interventions SDOH Screenings   Food Insecurity: No Food Insecurity (09/30/2022)  Housing: Low Risk  (09/30/2022)  Transportation Needs: No Transportation Needs (09/30/2022)  Utilities: Not At Risk (09/30/2022)  Alcohol Screen: Low Risk  (12/18/2021)  Depression (PHQ2-9): Low Risk  (12/18/2021)  Financial Resource Strain: Low Risk  (12/18/2021)  Physical Activity: Sufficiently Active (12/18/2021)  Social Connections: Moderately Integrated (12/18/2021)  Stress: No Stress Concern Present (12/18/2021)  Tobacco Use: Medium Risk (09/30/2022)    Readmission Risk Interventions     No data to display

## 2022-10-04 NOTE — Consult Note (Signed)
   [facility] CM Inpatient Consult   10/04/2022  Alyssa [name] 1925-02-08 [id number]  [facility] [[facility]]  Accountable Care Organization [ACO] Patient: Alyssa [name]  insurance  Alyssa [name] remote coverage review for patient admitted to [facility]     Primary Care Provider:  [name], MD with  at [facility] is listed to provide the transition of care follow up calls and appointments  Patient screened for less than 7 days readmission with 2 hospitalizations in the past 6 months with noted medium risk score for unplanned readmission risk l to assess for potential [facility]  [[facility]] Care Management service needs for post hospital transition for care coordination.  Review of patient's electronic medical record reveals patient is being recommended for a SNF rehab transition from the hospital.   Plan:  Continue to follow progress and disposition to assess for post hospital community care coordination/management needs.  Referral request for community care coordination: anticipate the community [facility] for follow up if returning home otherwise post hospital transitional needs can be met at a SNF rehab level of care.  Of note, [facility] Care Management/Population Health does not replace or interfere with any arrangements made by the Inpatient Transition of Care team.  For questions contact:   Charlesetta Shanks, RN BSN CCM [facility]  [phone] business mobile phone Toll free office [phone]  Fax number: [phone] [email or web address]

## 2022-10-04 NOTE — Care Management Important Message (Signed)
Important Message  Patient Details IM Letter given. Name: Alyssa [name] MRN: [id number] Date of Birth: 21-Sep-1924   Medicare Important Message Given:  Yes     Caren Macadam 10/04/2022, 2:44 PM

## 2022-10-04 NOTE — Progress Notes (Signed)
PROGRESS NOTE  Alyssa [name]  DOB: [birth year]  PCP: [name], MD WJX:[id number]  DOA: 09/30/2022  LOS: 3 days  Hospital Day: 5  Brief narrative: Roland Bladow is a 89 y.o. female with PMH significant for HTN, chronic A-fib s/p PPM on Eliquis, CKD, h/o breast cancer, GERD, hypothyroidism, anxiety, IBS 8/22, patient was brought to the ED after a mechanical fall. Apparently she was fixing her bed, she had multiple pillows on her foot side to elevate her edematous leg. At least 6 of the pillows fell on her pushing her back and falling backward into the carpeted floor. She had some headache.  In the emergency room, hemodynamically stable. On room air.  Skeletal survey negative.  Sodium was low at 121, chloride 89.  Admitted due to fall, hyponatremia.   Subjective: Patient was seen and examined this morning.  Elderly Caucasian female.  Sitting up in bed.  Not in distress.  Daughter at bedside.  No new symptoms.  SNF in process.  Assessment and plan: Hypovolemic hyponatremia Presented with low serum sodium of 121, calculated low serum osmolality of 276, low urine osmolality of 184.  Suggestive of hypovolemic hyponatremia due to low sodium and water intake. Patient was given IV fluid with adequate response. Currently off IV fluid.  On regular diet.  Sodium level gradually improving.  Close to 130.  Recent Labs  Lab 09/30/22 1124 10/01/22 0357 10/02/22 0434 10/04/22 0436  NA 121* 128* 130* 129*    Hypokalemia Potassium Level Improved with replacement. Recent Labs  Lab 09/30/22 1124 10/01/22 0357 10/02/22 0434 10/04/22 0436  K 3.6 3.2* 4.4 4.3  MG  --   --  2.2  --    CKD stage IIIa Creatinine stable. Recent Labs    10/20/21 1118 07/08/22 1554 09/09/22 1513 09/23/22 1815 09/24/22 0536 09/25/22 0658 09/30/22 1124 10/01/22 0357 10/02/22 0434 10/04/22 0436  BUN 29* 29* 34* 18 13 10 13 11 12 14   CREATININE 1.16 1.16 1.17 1.03* 0.85 0.95 0.90 0.92 1.02*  0.88   Fall with deconditioning PT eval obtained.  SNF recommended.  TOC consulted.  SNF in process   Lower extremity cellulitis  chronic lower extremity lymphedema Echocardiogram with EF 55%, severely dilated both atriums, moderately elevated pulm artery pressure PTA on Lasix 40 mg daily I would avoid diuresis for now given her low solute intake, risk of dehydration and risk of worsening of hyponatremia, Continue Ace wrap on both legs.  Continue leg elevation. Continue empiric antibiotics for lower extremity cellulitis.  Plan for 7-day course of antibiotics.  Permanent A-fib rate controlled.  Therapeutic on Eliquis.   Hypothyroidism on Synthroid 50 mcg daily  GERD on PPI  Mild intermittent asthma Continue bronchodilator as needed.  Goals of care   Code Status: DNR  Patient was living alone.  Family had a plan to move her to an independent living facility nearby her daughter's town in [place] on 7/19.  Patient seems weak for discharge to home.  Therapy was to see if she gets approved for SNF.  TOC consulted.  Pending SNF approval by insurance   DVT prophylaxis:  apixaban (ELIQUIS) tablet 2.5 mg Start: 09/30/22 2200 apixaban (ELIQUIS) tablet 2.5 mg    Antimicrobials: IV Rocephin Fluid: None currently. Consultants: None Family Communication: Daughter at bedside today  Status: Inpatient Level of care:  Med-Surg   Patient is from: Home  Needs to continue in-hospital care: Improving sodium level.  Continue to monitor Anticipated d/c to: SNF likely  Diet:  Diet Order             Diet regular Fluid consistency: Thin  Diet effective now                   Scheduled Meds:  apixaban  2.5 mg Oral BID   fluconazole  100 mg Oral Daily   levothyroxine  50 mcg Oral Q0600   mometasone-formoterol  2 puff Inhalation BID   pantoprazole  40 mg Oral Daily    PRN meds: acetaminophen **OR** acetaminophen, albuterol, alum & mag hydroxide-simeth, diazepam, ondansetron  **OR** ondansetron (ZOFRAN) IV   Infusions:   cefTRIAXone (ROCEPHIN)  IV 1 g (10/04/22 1100)    Antimicrobials: Anti-infectives (From admission, onward)    Start     Dose/Rate Route Frequency Ordered Stop   10/01/22 1200  cefTRIAXone (ROCEPHIN) 1 g in sodium chloride 0.9 % 100 mL IVPB        1 g 200 mL/hr over 30 Minutes Intravenous Every 24 hours 09/30/22 1432 10/08/22 1159   10/01/22 1000  fluconazole (DIFLUCAN) tablet 100 mg        100 mg Oral Daily 09/30/22 1808 10/08/22 0959   09/30/22 1300  cefTRIAXone (ROCEPHIN) 1 g in sodium chloride 0.9 % 100 mL IVPB        1 g 200 mL/hr over 30 Minutes Intravenous  Once 09/30/22 1259 09/30/22 1505       Nutritional status:  Body mass index is 16.62 kg/m.          Objective: Vitals:   10/04/22 0232 10/04/22 0400  BP: (!) 175/86 (!) 152/90  Pulse: 62   Resp: 16   Temp: 97.7 F (36.5 C)   SpO2: 95%     Intake/Output Summary (Last 24 hours) at 10/04/2022 1344 Last data filed at 10/04/2022 0900 Gross per 24 hour  Intake 480 ml  Output --  Net 480 ml   Filed Weights   09/30/22 1055  Weight: 46.7 kg   Weight change:  Body mass index is 16.62 kg/m.   Physical Exam: General exam: Pleasant elderly Caucasian female.  Not in distress. Skin: No rashes, lesions or ulcers. HEENT: Atraumatic, normocephalic, no obvious bleeding Lungs: Clear to auscultation bilaterally CVS: Regular rate and rhythm, no murmur GI/Abd soft, nontender, nondistended, bowel sound present CNS: Alert, awake, oriented x 3.  Mild hard of hearing Psychiatry: Mood appropriate Extremities: Bilateral pedal edema improving with Ace wrapping  Data Review: I have personally reviewed the laboratory data and studies available.  F/u labs  Unresulted Labs (From admission, onward)     Start     Ordered   09/30/22 1807  C Difficile Quick Screen w PCR reflex  (C Difficile quick screen w PCR reflex panel )  Once, for 24 hours,   TIMED       References:     CDiff Information Tool   09/30/22 1806            Total time spent in review of labs and imaging, patient evaluation, formulation of plan, documentation and communication with family: 25 minutes  Signed, Lorin Glass, MD [facility] 10/04/2022

## 2022-10-04 NOTE — Progress Notes (Signed)
Physical Therapy Treatment Patient Details Name: Alyssa [name] MRN: [id number] DOB: [birth year] Today's Date: 10/04/2022   History of Present Illness 89 y.o. female presents to therapy s/p hospital admission on 09/30/2022 due to mechanical fall in home setting resulting in pt striking her head and back.  Pt found to have hyponatremia and hypokalemia. Imaging negative for acute findings. Pt recently hospitalized 8/15-8/18/2024  due to dysphagia.  Pt has medical history significant for permanent A-fib on Eliquis, CHB s/p PPM, chronic HFpEF, severe TR, CKD stage IIIb, asthma, acquired hypothyroidism (s/p thyroidectomy for thyroid cancer), and anxiety.    PT Comments  Pt making gradual progress.  Does have improved stability with use of RW.  All VSS with activity.  She ambulated 350' without loss of balance but at low gait speed. Required increased time for transfers but no physical assist.  Pt does report easily fatigued compared to baseline.  Pt and family have requested SNF placement, but was discussed at eval that she may not qualify.  If not recommend HHPT services.    If plan is discharge home, recommend the following: Assistance with cooking/housework;Help with stairs or ramp for entrance;Assist for transportation   Can travel by private vehicle        Equipment Recommendations  None recommended by PT    Recommendations for Other Services       Precautions / Restrictions Precautions Precautions: Fall     Mobility  Bed Mobility Overal bed mobility: Needs Assistance Bed Mobility: Supine to Sit, Sit to Supine     Supine to sit: Supervision Sit to supine: Supervision        Transfers Overall transfer level: Needs assistance Equipment used: Rolling walker (2 wheels) Transfers: Sit to/from Stand Sit to Stand: Supervision           General transfer comment: slow to rise but no assist    Ambulation/Gait Ambulation/Gait assistance: Supervision Gait Distance (Feet):  350 Feet Assistive device: Rolling walker (2 wheels) Gait Pattern/deviations: Step-through pattern, Decreased stride length Gait velocity: 0.67 ft/sec; decreased     General Gait Details: Reports feels unsteady/"wobbly" but no LOB, cued for closer RW proximity and hand placement and pt report feels more steady; states her speed is quite a bit slower than normal   Stairs             Wheelchair Mobility     Tilt Bed    Modified Rankin (Stroke Patients Only)       Balance Overall balance assessment: Needs assistance, History of Falls Sitting-balance support: No upper extremity supported, Feet supported Sitting balance-Leahy Scale: Good     Standing balance support: No upper extremity supported, Bilateral upper extremity supported Standing balance-Leahy Scale: Fair Standing balance comment: Could static stand without AD; RW to ambulate                            Cognition Arousal: Alert Behavior During Therapy: WFL for tasks assessed/performed Overall Cognitive Status: Within Functional Limits for tasks assessed                                          Exercises      General Comments General comments (skin integrity, edema, etc.): BP 145/65; HR 60's; O2 sats 98%      Pertinent Vitals/Pain Pain Assessment Pain Assessment: No/denies pain  Home Living                          Prior Function            PT Goals (current goals can now be found in the care plan section) Progress towards PT goals: Progressing toward goals    Frequency    Min 1X/week      PT Plan      Co-evaluation              AM-PAC PT "6 Clicks" Mobility   Outcome Measure  Help needed turning from your back to your side while in a flat bed without using bedrails?: None Help needed moving from lying on your back to sitting on the side of a flat bed without using bedrails?: None Help needed moving to and from a bed to a chair  (including a wheelchair)?: A Little Help needed standing up from a chair using your arms (e.g., wheelchair or bedside chair)?: A Little Help needed to walk in hospital room?: A Little Help needed climbing 3-5 steps with a railing? : A Little 6 Click Score: 20    End of Session Equipment Utilized During Treatment: Gait belt Activity Tolerance: Patient tolerated treatment well Patient left: with call bell/phone within reach;in bed;with bed alarm set Nurse Communication: Mobility status PT Visit Diagnosis: Other abnormalities of gait and mobility (R26.89);Unsteadiness on feet (R26.81);History of falling (Z91.81);Difficulty in walking, not elsewhere classified (R26.2)     Time: 4540-9811 PT Time Calculation (min) (ACUTE ONLY): 23 min  Charges:    $Gait Training: 8-22 mins $Therapeutic Activity: 8-22 mins PT General Charges $$ ACUTE PT VISIT: 1 Visit                     Anise Salvo, PT Acute Rehab [facility] [phone]    Rayetta Humphrey 10/04/2022, 3:56 PM

## 2022-10-04 NOTE — Plan of Care (Signed)
  Problem: Clinical Measurements: Goal: Ability to avoid or minimize complications of infection will improve Outcome: Adequate for Discharge   Problem: Skin Integrity: Goal: Skin integrity will improve Outcome: Adequate for Discharge   Problem: Clinical Measurements: Goal: Will remain free from infection Outcome: Adequate for Discharge Goal: Diagnostic test results will improve Outcome: Adequate for Discharge   Problem: Activity: Goal: Risk for activity intolerance will decrease Outcome: Adequate for Discharge   Problem: Nutrition: Goal: Adequate nutrition will be maintained Outcome: Adequate for Discharge   Problem: Coping: Goal: Level of anxiety will decrease Outcome: Adequate for Discharge   Problem: Elimination: Goal: Will not experience complications related to bowel motility Outcome: Adequate for Discharge   Problem: Safety: Goal: Ability to remain free from injury will improve Outcome: Adequate for Discharge   Problem: Skin Integrity: Goal: Risk for impaired skin integrity will decrease Outcome: Adequate for Discharge

## 2022-10-05 DIAGNOSIS — E871 Hypo-osmolality and hyponatremia: Secondary | ICD-10-CM | POA: Diagnosis not present

## 2022-10-05 MED ORDER — AMLODIPINE BESYLATE 10 MG PO TABS
5.0000 mg | ORAL_TABLET | Freq: Every day | ORAL | Status: DC
  Administered 2022-10-05 – 2022-10-06 (×2): 5 mg via ORAL
  Filled 2022-10-05 (×2): qty 1

## 2022-10-05 NOTE — Progress Notes (Signed)
Mobility Specialist - Progress Note   10/05/22 0921  Mobility  Activity Ambulated with assistance in hallway;Ambulated with assistance to bathroom  Level of Assistance Contact guard assist, steadying assist  Assistive Device Front wheel walker;Other (Comment) (HHA)  Distance Ambulated (ft) 160 ft  Range of Motion/Exercises Active  Activity Response Tolerated well  Mobility Referral Yes  $Mobility charge 1 Mobility  Mobility Specialist Start Time (ACUTE ONLY) 0855  Mobility Specialist Stop Time (ACUTE ONLY) 0920  Mobility Specialist Time Calculation (min) (ACUTE ONLY) 25 min   Pt was found in bed wanting to use the bathroom.Pt assisted to bathroom with HHA. Agreeable to ambulate hallway afterwards. Pt stated being tired and weak this morning. At EOS returned to bed with all needs met. Call bell in reach and daughter in room.  Billey Chang Mobility Specialist

## 2022-10-05 NOTE — Progress Notes (Signed)
Mobility Specialist - Progress Note   10/05/22 1433  Mobility  Activity Ambulated with assistance in hallway  Level of Assistance Minimal assist, patient does 75% or more  Assistive Device Front wheel walker  Distance Ambulated (ft) 350 ft  Range of Motion/Exercises Active  Activity Response Tolerated well  Mobility Referral Yes  $Mobility charge 1 Mobility  Mobility Specialist Start Time (ACUTE ONLY) 1420  Mobility Specialist Stop Time (ACUTE ONLY) 1433  Mobility Specialist Time Calculation (min) (ACUTE ONLY) 13 min   Pt was found in bed and agreeable to ambulate. No complaints with session. Stated feeling stronger. At EOS returned to bed with all needs met. Call bell in reach.  Billey Chang Mobility Specialist

## 2022-10-05 NOTE — Plan of Care (Signed)
  Problem: Clinical Measurements: Goal: Ability to maintain clinical measurements within normal limits will improve Outcome: Progressing Goal: Respiratory complications will improve Outcome: Progressing Goal: Cardiovascular complication will be avoided Outcome: Progressing   Problem: Activity: Goal: Risk for activity intolerance will decrease Outcome: Progressing   Problem: Safety: Goal: Ability to remain free from injury will improve Outcome: Progressing

## 2022-10-05 NOTE — TOC Progression Note (Signed)
Transition of Care [facility]) - Progression Note    Patient Details  Name: Alyssa [name] MRN: [id number] Date of Birth: Jun 12, 1924  [facility] CM/SW Contact  Larrie Kass, LCSW Phone Number: 10/05/2022, 10:17 AM  Clinical Narrative:     Pt's insurance auth pending. TOC to follow.    Expected Discharge Plan: Skilled Nursing Facility Barriers to Discharge: Continued Medical Work up  Expected Discharge Plan and Services In-house Referral: Clinical Social Work     Living arrangements for the past 2 months: Apartment                                       Social Determinants of Health (SDOH) Interventions SDOH Screenings   Food Insecurity: No Food Insecurity (09/30/2022)  Housing: Low Risk  (09/30/2022)  Transportation Needs: No Transportation Needs (09/30/2022)  Utilities: Not At Risk (09/30/2022)  Alcohol Screen: Low Risk  (12/18/2021)  Depression (PHQ2-9): Low Risk  (12/18/2021)  Financial Resource Strain: Low Risk  (12/18/2021)  Physical Activity: Sufficiently Active (12/18/2021)  Social Connections: Moderately Integrated (12/18/2021)  Stress: No Stress Concern Present (12/18/2021)  Tobacco Use: Medium Risk (09/30/2022)    Readmission Risk Interventions     No data to display

## 2022-10-05 NOTE — Progress Notes (Addendum)
PROGRESS NOTE  Alyssa [name]  DOB: [birth year]  PCP: Alyssa Grandchild, MD NGE:[id number]  DOA: 09/30/2022  LOS: 4 days  Hospital Day: 6  Brief narrative: Alyssa [name] is a 89 y.o. female with PMH significant for HTN, chronic A-fib s/p PPM on Eliquis, CKD, h/o breast cancer, GERD, hypothyroidism, anxiety, IBS 8/22, patient was brought to the ED after a mechanical fall. Apparently she was fixing her bed, she had multiple pillows on her foot side to elevate her edematous leg. At least 6 of the pillows fell on her pushing her back and falling backward into the carpeted floor. She had some headache.  In the emergency room, hemodynamically stable. On room air.  Skeletal survey negative.  Sodium was low at 121, chloride 89.  Admitted due to fall, hyponatremia.   Subjective: Patient was seen and examined this morning.  Not in distress Pending SNF  Assessment and plan: Hypovolemic hyponatremia Presented with low serum sodium of 121, calculated low serum osmolality of 276, low urine osmolality of 184.  Suggestive of hypovolemic hyponatremia due to low sodium and water intake. Patient was given IV fluid with adequate response. Currently off IV fluid.  On regular diet.  Sodium level gradually improving.  Close to 130.  Recent Labs  Lab 09/30/22 1124 10/01/22 0357 10/02/22 0434 10/04/22 0436  NA 121* 128* 130* 129*    Hypokalemia Potassium Level Improved with replacement. Recent Labs  Lab 09/30/22 1124 10/01/22 0357 10/02/22 0434 10/04/22 0436  K 3.6 3.2* 4.4 4.3  MG  --   --  2.2  --    CKD stage IIIa Creatinine stable. Recent Labs    10/20/21 1118 07/08/22 1554 09/09/22 1513 09/23/22 1815 09/24/22 0536 09/25/22 0658 09/30/22 1124 10/01/22 0357 10/02/22 0434 10/04/22 0436  BUN 29* 29* 34* 18 13 10 13 11 12 14   CREATININE 1.16 1.16 1.17 1.03* 0.85 0.95 0.90 0.92 1.02* 0.88   Lower extremity cellulitis  chronic lower extremity lymphedema Echocardiogram with  EF 55%, severely dilated both atriums, moderately elevated pulm artery pressure PTA on Lasix 40 mg daily I would avoid diuresis for now given her low solute intake, risk of dehydration and risk of worsening of hyponatremia, Continue Ace wrap on both legs.  Continue leg elevation. Continue empiric antibiotics for lower extremity cellulitis.  Plan for 7-day course of antibiotics.  Permanent A-fib rate controlled.  Therapeutic on Eliquis.  Hypertension Previously on Lasix which was held because of low solute intake and risk of dehydration.  Blood pressure rising up.  Started on amlodipine 5 mg daily.   Hypothyroidism on Synthroid 50 mcg daily  GERD on PPI  Mild intermittent asthma Continue bronchodilator as needed.   Fall with deconditioning PT eval obtained.  SNF recommended.  TOC consulted.  SNF in process  Goals of care   Code Status: DNR  Patient was living alone.  Family had a plan to move her to an independent living facility nearby her daughter's town in [place] on 7/19.  Patient seems weak for discharge to home.  Therapy was to see if she gets approved for SNF.  TOC consulted.  Pending SNF approval by insurance   DVT prophylaxis:  apixaban (ELIQUIS) tablet 2.5 mg Start: 09/30/22 2200 apixaban (ELIQUIS) tablet 2.5 mg    Antimicrobials: IV Rocephin Fluid: None currently. Consultants: None Family Communication: Daughter at bedside today  Status: Inpatient Level of care:  Med-Surg   Patient is from: Home  Needs to continue in-hospital care: Stable sodium level.  Stable for discharge Anticipated d/c to: Pending SNF    Diet:  Diet Order             Diet regular Fluid consistency: Thin  Diet effective now                   Scheduled Meds:  amLODipine  5 mg Oral Daily   apixaban  2.5 mg Oral BID   fluconazole  100 mg Oral Daily   levothyroxine  50 mcg Oral Q0600   mometasone-formoterol  2 puff Inhalation BID   pantoprazole  40 mg Oral Daily   senna   1 tablet Oral QHS    PRN meds: acetaminophen **OR** acetaminophen, albuterol, alum & mag hydroxide-simeth, diazepam, hydrALAZINE, ondansetron **OR** ondansetron (ZOFRAN) IV, polyethylene glycol   Infusions:   cefTRIAXone (ROCEPHIN)  IV 1 g (10/05/22 1252)    Antimicrobials: Anti-infectives (From admission, onward)    Start     Dose/Rate Route Frequency Ordered Stop   10/01/22 1200  cefTRIAXone (ROCEPHIN) 1 g in sodium chloride 0.9 % 100 mL IVPB        1 g 200 mL/hr over 30 Minutes Intravenous Every 24 hours 09/30/22 1432 10/08/22 1159   10/01/22 1000  fluconazole (DIFLUCAN) tablet 100 mg        100 mg Oral Daily 09/30/22 1808 10/08/22 0959   09/30/22 1300  cefTRIAXone (ROCEPHIN) 1 g in sodium chloride 0.9 % 100 mL IVPB        1 g 200 mL/hr over 30 Minutes Intravenous  Once 09/30/22 1259 09/30/22 1505       Nutritional status:  Body mass index is 16.62 kg/m.          Objective: Vitals:   10/05/22 0945 10/05/22 1317  BP: 139/63 (!) 145/64  Pulse:  68  Resp:    Temp:  97.8 F (36.6 C)  SpO2:  95%    Intake/Output Summary (Last 24 hours) at 10/05/2022 1430 Last data filed at 10/04/2022 1841 Gross per 24 hour  Intake 240 ml  Output --  Net 240 ml   Filed Weights   09/30/22 1055  Weight: 46.7 kg   Weight change:  Body mass index is 16.62 kg/m.   Physical Exam: General exam: Pleasant elderly Caucasian female.  Not in distress. Skin: No rashes, lesions or ulcers. HEENT: Atraumatic, normocephalic, no obvious bleeding Lungs: Clear to auscultation bilaterally CVS: Regular rate and rhythm, no murmur GI/Abd soft, nontender, nondistended, bowel sound present CNS: Alert, awake, oriented x 3.  Mild hard of hearing Psychiatry: Mood appropriate Extremities: Bilateral pedal edema improving with Ace wrapping  Data Review: I have personally reviewed the laboratory data and studies available.  F/u labs  Unresulted Labs (From admission, onward)     Start      Ordered   09/30/22 1807  C Difficile Quick Screen w PCR reflex  (C Difficile quick screen w PCR reflex panel )  Once, for 24 hours,   TIMED       References:    CDiff Information Tool   09/30/22 1806            Total time spent in review of labs and imaging, patient evaluation, formulation of plan, documentation and communication with family: 25 minutes  Signed, Lorin Glass, MD [facility] 10/05/2022

## 2022-10-06 DIAGNOSIS — E871 Hypo-osmolality and hyponatremia: Secondary | ICD-10-CM | POA: Diagnosis not present

## 2022-10-06 LAB — CULTURE, BLOOD (ROUTINE X 2)
Culture: NO GROWTH
Culture: NO GROWTH
Special Requests: ADEQUATE
Special Requests: ADEQUATE

## 2022-10-06 MED ORDER — SENNA 8.6 MG PO TABS: 1.0000 | ORAL_TABLET | Freq: Every day | ORAL | 0 refills | Status: AC

## 2022-10-06 MED ORDER — POLYETHYLENE GLYCOL 3350 17 G PO PACK: 17.0000 g | PACK | Freq: Every day | ORAL | 0 refills | Status: AC | PRN

## 2022-10-06 MED ORDER — AMLODIPINE BESYLATE 5 MG PO TABS: 5.0000 mg | ORAL_TABLET | Freq: Every day | ORAL | 0 refills | Status: DC

## 2022-10-06 MED ORDER — HYDRALAZINE HCL 20 MG/ML IJ SOLN: 10.0000 mg | Freq: Four times a day (QID) | INTRAMUSCULAR | Status: DC | PRN

## 2022-10-06 NOTE — Progress Notes (Signed)
Physical Therapy Treatment Patient Details Name: Alyssa [name] MRN: [id number] DOB: 1924/03/11 Today's Date: 10/06/2022   History of Present Illness 89 y.o. female presents to therapy s/p hospital admission on 09/30/2022 due to mechanical fall in home setting resulting in pt striking her head and back.  Pt found to have hyponatremia and hypokalemia. Imaging negative for acute findings. Pt recently hospitalized 8/15-8/18/2024  due to dysphagia.  Pt has medical history significant for permanent A-fib on Eliquis, CHB s/p PPM, chronic HFpEF, severe TR, CKD stage IIIb, asthma, acquired hypothyroidism (s/p thyroidectomy for thyroid cancer), and anxiety.    PT Comments  Pt reports she is continuing to feel better but not yet back to her baseline. She tolerated session well. She still has intermittent unsteadiness. Performed sit to stand x 10 reps for strengthening. Pt is hopeful to be able to d/c from hospital soon. Daughter arrived at end of session-she is hopeful for a short rehab stay before a planned move to [place].     If plan is discharge home, recommend the following: Assistance with cooking/housework;Assist for transportation;Help with stairs or ramp for entrance   Can travel by private vehicle        Equipment Recommendations  None recommended by PT    Recommendations for Other Services       Precautions / Restrictions Precautions Precautions: Fall Restrictions Weight Bearing Restrictions: No     Mobility  Bed Mobility Overal bed mobility: Needs Assistance Bed Mobility: Sit to Supine       Sit to supine: Modified independent (Device/Increase time)   General bed mobility comments: increased time.    Transfers Overall transfer level: Needs assistance Equipment used: Rolling walker (2 wheels) Transfers: Sit to/from Stand Sit to Stand: Supervision           General transfer comment: Cues for hand placement. Increased time.    Ambulation/Gait Ambulation/Gait  assistance: Supervision Gait Distance (Feet): 350 Feet Assistive device: Rolling walker (2 wheels) Gait Pattern/deviations: Step-through pattern, Decreased stride length       General Gait Details: Pt reports feeling better. 1 instance of unsteadiness while conversing and turning head to look at therapist. Tolerated distance well. Slow gait speed still.   Stairs             Wheelchair Mobility     Tilt Bed    Modified Rankin (Stroke Patients Only)       Balance                                            Cognition Arousal: Alert Behavior During Therapy: WFL for tasks assessed/performed Overall Cognitive Status: Within Functional Limits for tasks assessed                                          Exercises Other Exercises Other Exercises: 5 repst x 2 of sit to stands-supervision    General Comments        Pertinent Vitals/Pain Pain Assessment Faces Pain Scale: Hurts a little bit Pain Location: LBP/buttocks Pain Descriptors / Indicators: Discomfort Pain Intervention(s): Monitored during session    Home Living                          Prior Function  PT Goals (current goals can now be found in the care plan section) Progress towards PT goals: Progressing toward goals    Frequency    Min 1X/week      PT Plan      Co-evaluation              AM-PAC PT "6 Clicks" Mobility   Outcome Measure  Help needed turning from your back to your side while in a flat bed without using bedrails?: None Help needed moving from lying on your back to sitting on the side of a flat bed without using bedrails?: None Help needed moving to and from a bed to a chair (including a wheelchair)?: None Help needed standing up from a chair using your arms (e.g., wheelchair or bedside chair)?: A Little Help needed to walk in hospital room?: A Little Help needed climbing 3-5 steps with a railing? : A Little 6  Click Score: 21    End of Session Equipment Utilized During Treatment: Gait belt Activity Tolerance: Patient tolerated treatment well Patient left: in bed;with call bell/phone within reach;with bed alarm set   PT Visit Diagnosis: History of falling (Z91.81);Unsteadiness on feet (R26.81);Difficulty in walking, not elsewhere classified (R26.2)     Time: 9811-9147 PT Time Calculation (min) (ACUTE ONLY): 23 min  Charges:    $Gait Training: 23-37 mins PT General Charges $$ ACUTE PT VISIT: 1 Visit                         Faye Ramsay, PT Acute Rehabilitation  Office: [phone]

## 2022-10-06 NOTE — TOC Transition Note (Signed)
Transition of Care [facility]) - CM/SW Discharge Note   Patient Details  Name: Alyssa [name] MRN: [id number] Date of Birth: [birth year]  [facility] CM/SW Contact:  Larrie Kass, LCSW Phone Number: 10/06/2022, 1:40 PM   Clinical Narrative:     Pt's insurance Berkley Harvey was denied. Met with pt and her daughter to inform them of insurance denial for SNF placement. Pt's daughter agreed to take pt home.  Csw spoke with pt's daughter to offer choice for [facility] agency. Pt's daughter reports no preference in [facility] agency. [facility] accepted [facility] for [facility] services. No further toc needs, TOC sign off.   Final next level of care: Home w Home Health Services Barriers to Discharge: Continued Medical Work up   Patient Goals and CMS Choice CMS Medicare.gov Compare Post Acute Care list provided to:: Patient Represenative (must comment) Choice offered to / list presented to : Patient, Adult Children  Discharge Placement                    Name of family member notified: Alyssa [name] (Daughter)  [phone] (Home Phone Patient and family notified of of transfer: 10/06/22  Discharge Plan and Services Additional resources added to the After Visit Summary for   In-house Referral: Clinical Social Work                        HH Arranged: PT HH Agency:  ([facility]) Date HH Agency Contacted: 10/06/22 Time HH Agency Contacted: 1337 Representative spoke with at [facility] Agency: Lora Havens  Social Determinants of Health (SDOH) Interventions SDOH Screenings   Food Insecurity: No Food Insecurity (09/30/2022)  Housing: Low Risk  (09/30/2022)  Transportation Needs: No Transportation Needs (09/30/2022)  Utilities: Not At Risk (09/30/2022)  Alcohol Screen: Low Risk  (12/18/2021)  Depression (PHQ2-9): Low Risk  (12/18/2021)  Financial Resource Strain: Low Risk  (12/18/2021)  Physical Activity: Sufficiently Active (12/18/2021)  Social Connections: Moderately Integrated (12/18/2021)  Stress: No  Stress Concern Present (12/18/2021)  Tobacco Use: Medium Risk (09/30/2022)     Readmission Risk Interventions     No data to display

## 2022-10-06 NOTE — TOC Progression Note (Addendum)
Transition of Care [facility]) - Progression Note    Patient Details  Name: Alyssa [name] MRN: [id number] Date of Birth: 01-02-1925  [facility] CM/SW Contact  Larrie Kass, LCSW Phone Number: 10/06/2022, 9:12 AM  Clinical Narrative:     Pt's PASRR number was assigned [id number].   Berk[employer] Harvey is still pending, being reviewed by Wellsite geologist. TOC to follow.  Pt's insurance Berkley Harvey went to [employer] [phone] choose option 5, must be done by 3:30pm today. MD made aware.TOC to follow.    Expected Discharge Plan: Skilled Nursing Facility Barriers to Discharge: Continued Medical Work up  Expected Discharge Plan and Services In-house Referral: Clinical Social Work     Living arrangements for the past 2 months: Apartment                                       Social Determinants of Health (SDOH) Interventions SDOH Screenings   Food Insecurity: No Food Insecurity (09/30/2022)  Housing: Low Risk  (09/30/2022)  Transportation Needs: No Transportation Needs (09/30/2022)  Utilities: Not At Risk (09/30/2022)  Alcohol Screen: Low Risk  (12/18/2021)  Depression (PHQ2-9): Low Risk  (12/18/2021)  Financial Resource Strain: Low Risk  (12/18/2021)  Physical Activity: Sufficiently Active (12/18/2021)  Social Connections: Moderately Integrated (12/18/2021)  Stress: No Stress Concern Present (12/18/2021)  Tobacco Use: Medium Risk (09/30/2022)    Readmission Risk Interventions     No data to display

## 2022-10-06 NOTE — Discharge Summary (Addendum)
Physician Discharge Summary  Shalia Schommer WUJ:[id number] DOB: [birth year] DOA: 09/30/2022  PCP: [name], MD  Admit date: 09/30/2022 Discharge date: 10/06/2022  Admitted From: Home Discharge disposition: Home with home health PT   Brief narrative: Alyssa [name] is a 89 y.o. female with PMH significant for HTN, chronic A-fib s/p PPM on Eliquis, CKD, h/o breast cancer, GERD, hypothyroidism, anxiety, IBS 8/22, patient was brought to the ED after a mechanical fall. Apparently she was fixing her bed, she had multiple pillows on her foot side to elevate her edematous leg. At least 6 of the pillows fell on her pushing her back and falling backward into the carpeted floor. She had some headache.  In the emergency room, hemodynamically stable. On room air.  Skeletal survey negative.  Sodium was low at 121, chloride 89.  Admitted due to fall, hyponatremia.   Subjective: Patient was seen and examined this morning.  Not in distress Insulin is denied SNF disposition.  Peer-to-peer was attempted but unsuccessful.  Hospital course: Hypovolemic hyponatremia Presented with low serum sodium of 121, calculated low serum osmolality of 276, low urine osmolality of 184.  Suggestive of hypovolemic hyponatremia due to low sodium and water intake. Patient was given IV fluid with adequate response. Currently off IV fluid.  On regular diet.  Sodium level gradually improving.  Close to 130.  Recent Labs  Lab 09/30/22 1124 10/01/22 0357 10/02/22 0434 10/04/22 0436  NA 121* 128* 130* 129*    Hypokalemia Potassium Level Improved with replacement. Recent Labs  Lab 09/30/22 1124 10/01/22 0357 10/02/22 0434 10/04/22 0436  K 3.6 3.2* 4.4 4.3  MG  --   --  2.2  --    CKD stage IIIa Creatinine stable. Recent Labs    10/20/21 1118 07/08/22 1554 09/09/22 1513 09/23/22 1815 09/24/22 0536 09/25/22 0658 09/30/22 1124 10/01/22 0357 10/02/22 0434 10/04/22 0436  BUN 29* 29* 34* 18 13  10 13 11 12 14   CREATININE 1.16 1.16 1.17 1.03* 0.85 0.95 0.90 0.92 1.02* 0.88   Lower extremity cellulitis  chronic lower extremity lymphedema Echocardiogram with EF 55%, both atriums were severely dilated, moderately elevated pulm artery pressure PTA on Lasix 40 mg daily I would avoid diuresis for now given her low solute intake, risk of dehydration and risk of worsening of hyponatremia, Continue Ace wrap on both legs.  Continue leg elevation. She was treated with 7-day course of empiric antibiotics for lower extremity cellulitis.   Permanent A-fib rate controlled.  Therapeutic on Eliquis.  Hypertension Previously on Lasix which was held because of low solute intake and risk of dehydration.  Blood pressure rising up.  Started on amlodipine 5 mg daily.  Blood pressure better now.   Hypothyroidism on Synthroid 50 mcg daily  GERD on PPI  Mild intermittent asthma Continue bronchodilator as needed.   Fall with deconditioning PT eval obtained.  SNF recommended.  TOC consulted.  SNF denied by insurance.  Plan to discharge with home health PT. Family had a plan to move her to an independent living facility nearby her daughter's town in [place] on 7/19.     Wounds:  -    Discharge Exam:   Vitals:   10/05/22 1933 10/05/22 1943 10/06/22 0323 10/06/22 1216  BP:  139/72 (!) 141/73 128/61  Pulse:  65 62 61  Resp:  18 16 20   Temp:  97.6 F (36.4 C) 97.7 F (36.5 C) 98.1 F (36.7 C)  TempSrc:  Oral Oral Oral  SpO2:  96% 96% 95% 97%  Weight:      Height:        Body mass index is 16.62 kg/m.   General exam: Pleasant elderly Caucasian female.  Not in distress. Skin: No rashes, lesions or ulcers. HEENT: Atraumatic, normocephalic, no obvious bleeding Lungs: Clear to auscultation bilaterally CVS: Regular rate and rhythm, no murmur GI/Abd soft, nontender, nondistended, bowel sound present CNS: Alert, awake, oriented x 3.  Mild hard of hearing Psychiatry: Mood  appropriate Extremities: Bilateral pedal edema improving with Ace wrapping  Follow ups:    Contact information for follow-up providers     [name], MD Follow up.   Specialty: Internal Medicine Contact information: [address] [place] [address] [phone]         [facility] Follow up.   Why: Home Health service will follow up with you 24hr to 48hr after discharge. Contact information: [address] [place] [address] [phone]              Contact information for after-discharge care     Destination     [facility] Preferred SNF .   Service: Skilled Nursing Contact information: [address] [place] [address] [phone]                     Discharge Instructions:   Discharge Instructions     Call MD for:  difficulty breathing, headache or visual disturbances   Complete by: As directed    Call MD for:  extreme fatigue   Complete by: As directed    Call MD for:  hives   Complete by: As directed    Call MD for:  persistant dizziness or light-headedness   Complete by: As directed    Call MD for:  persistant nausea and vomiting   Complete by: As directed    Call MD for:  severe uncontrolled pain   Complete by: As directed    Call MD for:  temperature >100.4   Complete by: As directed    Diet general   Complete by: As directed    Discharge instructions   Complete by: As directed    General discharge instructions: Follow with Primary MD [name], MD in 7 days  Please request your PCP  to go over your hospital tests, procedures, radiology results at the follow up. Please get your medicines reviewed and adjusted.  Your PCP may decide to repeat certain labs or tests as needed. Do not drive, operate heavy machinery, perform activities at heights, swimming or participation in water activities or provide baby sitting services if your were  admitted for syncope or siezures until you have seen by Primary MD or a Neurologist and advised to do so again. [place] Controlled Substance Reporting System database was reviewed. Do not drive, operate heavy machinery, perform activities at heights, swim, participate in water activities or provide baby-sitting services while on medications for pain, sleep and mood until your outpatient physician has reevaluated you and advised to do so again.  You are strongly recommended to comply with the dose, frequency and duration of prescribed medications. Activity: As tolerated with Full fall precautions use walker/cane & assistance as needed Avoid using any recreational substances like cigarette, tobacco, alcohol, or non-prescribed drug. If you experience worsening of your admission symptoms, develop shortness of breath, life threatening emergency, suicidal or homicidal thoughts you must seek medical attention immediately by calling 911 or calling  your MD immediately  if symptoms less severe. You must read complete instructions/literature along with all the possible adverse reactions/side effects for all the medicines you take and that have been prescribed to you. Take any new medicine only after you have completely understood and accepted all the possible adverse reactions/side effects.  Wear Seat belts while driving. You were cared for by a hospitalist during your hospital stay. If you have any questions about your discharge medications or the care you received while you were in the hospital after you are discharged, you can call the unit and ask to speak with the hospitalist or the covering physician. Once you are discharged, your primary care physician will handle any further medical issues. Please note that NO REFILLS for any discharge medications will be authorized once you are discharged, as it is imperative that you return to your primary care physician (or establish a relationship with a primary care  physician if you do not have one).   Increase activity slowly   Complete by: As directed        Discharge Medications:   Allergies as of 10/06/2022       Reactions   Penicillins Diarrhea   Has IBS (caused diarrhea)        Medication List     STOP taking these medications    famotidine 40 MG tablet Commonly known as: Pepcid   fluconazole 100 MG tablet Commonly known as: Diflucan   furosemide 40 MG tablet Commonly known as: LASIX       TAKE these medications    acetaminophen 650 MG CR tablet Commonly known as: TYLENOL Take 1,300 mg by mouth 2 (two) times daily.   albuterol 108 (90 Base) MCG/ACT inhaler Commonly known as: VENTOLIN HFA Inhale 2 puffs into the lungs every 6 (six) hours as needed.   amLODipine 5 MG tablet Commonly known as: NORVASC Take 1 tablet (5 mg total) by mouth daily. Start taking on: October 07, 2022   BENEFIBER PO Take 1 Dose by mouth daily.   diazepam 2 MG tablet Commonly known as: Valium Take 1 tablet (2 mg total) by mouth every 8 (eight) hours as needed for anxiety.   Dulera 100-5 MCG/ACT Aero Generic drug: mometasone-formoterol Inhale 2 puffs into the lungs 2 (two) times daily.   Eliquis 2.5 MG Tabs tablet Generic drug: apixaban TAKE 1 TABLET BY MOUTH TWICE A DAY   levothyroxine 50 MCG tablet Commonly known as: SYNTHROID Take 1 tablet (50 mcg total) by mouth daily before breakfast.   MISC NATURAL PRODUCTS EX Apply 1 application topically in the morning and at bedtime. CBD cream for back and ankle pain   omeprazole 40 MG capsule Commonly known as: PRILOSEC Take 1 capsule (40 mg total) by mouth daily.   polyethylene glycol 17 g packet Commonly known as: MIRALAX / GLYCOLAX Take 17 g by mouth daily as needed for moderate constipation.   senna 8.6 MG Tabs tablet Commonly known as: SENOKOT Take 1 tablet (8.6 mg total) by mouth at bedtime.   SYSTANE COMPLETE OP Apply 1 drop to eye as needed (dryness for right eye).          The results of significant diagnostics from this hospitalization (including imaging, microbiology, ancillary and laboratory) are listed below for reference.    Procedures and Diagnostic Studies:   ECHOCARDIOGRAM COMPLETE  Result Date: 10/01/2022    ECHOCARDIOGRAM REPORT   Patient Name:   HAGAR ZANARDI Date of Exam: 10/01/2022 Medical Rec #:  [id number]         Height:       66.0 in Accession #:    [id number]        Weight:       103.0 lb Date of Birth:  05-Mar-1924         BSA:          1.508 m Patient Age:    89 years          BP:           130/53 mmHg Patient Gender: F                 HR:           72 bpm. Exam Location:  Inpatient Procedure: 2D Echo, Cardiac Doppler and Color Doppler Indications:    Cardiomegaly                 Abnormal ECG  History:        Patient has prior history of Echocardiogram examinations, most                 recent 04/06/2021. Pacemaker, Arrythmias:Atrial Fibrillation;                 Risk Factors:Hypertension. Thyroid CA, breast CA, CKD.  Sonographer:    Milda Smart Referring Phys: [id number] DAVID MANUEL ORTIZ  Sonographer Comments: Image acquisition challenging due to patient body habitus. IMPRESSIONS  1. Left ventricular ejection fraction, by estimation, is 55%. The left ventricle has normal function. The left ventricle has no regional wall motion abnormalities. Septal-lateral dyssynchroncy consistent with RV pacing. Left ventricular diastolic parameters are indeterminate.  2. D-shaped interventricular septum suggestive of RV pressure/volume overload. Right ventricular systolic function is mildly reduced. The right ventricular size is moderately enlarged. There is moderately elevated pulmonary artery systolic pressure. The  estimated right ventricular systolic pressure is 52.1 mmHg.  3. Left atrial size was severely dilated.  4. Right atrial size was massively dilated.  5. The mitral valve is normal in structure. Mild mitral valve regurgitation. No evidence of  mitral stenosis.  6. The tricuspid valve is abnormal. Tricuspid valve regurgitation is torrential with inadequate leaflet coaptation. Systolic flow reversal in the hepatic vein systolic doppler pattern.  7. The aortic valve is tricuspid. There is mild calcification of the aortic valve. Aortic valve regurgitation is not visualized. No aortic stenosis is present.  8. The inferior vena cava is normal in size with <50% respiratory variability, suggesting right atrial pressure of 8 mmHg.  9. A small pericardial effusion is present. FINDINGS  Left Ventricle: Left ventricular ejection fraction, by estimation, is 55%. The left ventricle has normal function. The left ventricle has no regional wall motion abnormalities. The left ventricular internal cavity size was normal in size. There is no left ventricular hypertrophy. Left ventricular diastolic parameters are indeterminate. Right Ventricle: D-shaped interventricular septum suggestive of RV pressure/volume overload. The right ventricular size is moderately enlarged. No increase in right ventricular wall thickness. Right ventricular systolic function is mildly reduced. There is moderately elevated pulmonary artery systolic pressure. The tricuspid regurgitant velocity is 3.32 m/s, and with an assumed right atrial pressure of 8 mmHg, the estimated right ventricular systolic pressure is 52.1 mmHg. Left Atrium: Left atrial size was severely dilated. Right Atrium: Right atrial size was severely dilated. Pericardium: A small pericardial effusion is present. Mitral Valve: The mitral valve is normal in structure. Mild mitral valve regurgitation. No evidence of mitral valve stenosis. Tricuspid Valve:  The tricuspid valve is abnormal. Tricuspid valve regurgitation is severe. Aortic Valve: The aortic valve is tricuspid. There is mild calcification of the aortic valve. Aortic valve regurgitation is not visualized. No aortic stenosis is present. Pulmonic Valve: The pulmonic valve was  normal in structure. Pulmonic valve regurgitation is trivial. Aorta: The aortic root is normal in size and structure. Venous: The inferior vena cava is normal in size with less than 50% respiratory variability, suggesting right atrial pressure of 8 mmHg. IAS/Shunts: No atrial level shunt detected by color flow Doppler. Additional Comments: A device lead is visualized in the right ventricle.  LEFT VENTRICLE PLAX 2D LVIDd:         4.50 cm   Diastology LVIDs:         3.00 cm   LV e' medial:    9.14 cm/s LV PW:         1.00 cm   LV E/e' medial:  7.3 LV IVS:        0.80 cm   LV e' lateral:   11.10 cm/s LVOT diam:     2.20 cm   LV E/e' lateral: 6.0 LV SV:         64 LV SV Index:   42 LVOT Area:     3.80 cm  RIGHT VENTRICLE            IVC RV S prime:     9.57 cm/s  IVC diam: 1.70 cm TAPSE (M-mode): 1.1 cm LEFT ATRIUM            Index        RIGHT ATRIUM           Index LA diam:      4.70 cm  3.12 cm/m   RA Area:     43.90 cm LA Vol (A2C): 118.0 ml 78.23 ml/m  RA Volume:   182.00 ml 120.66 ml/m LA Vol (A4C): 84.4 ml  55.92 ml/m  AORTIC VALVE LVOT Vmax:   91.05 cm/s LVOT Vmean:  62.450 cm/s LVOT VTI:    0.168 m  AORTA Ao Root diam: 3.00 cm Ao Asc diam:  3.20 cm MITRAL VALVE               TRICUSPID VALVE MV Area (PHT): 3.15 cm    TR Peak grad:   44.1 mmHg MV Decel Time: 241 msec    TR Mean grad:   29.0 mmHg MR Peak grad: 26.1 mmHg    TR Vmax:        332.00 cm/s MR Vmax:      255.50 cm/s  TR Vmean:       256.0 cm/s MV E velocity: 66.80 cm/s                            SHUNTS                            Systemic VTI:  0.17 m                            Systemic Diam: 2.20 cm Dalton McleanMD Electronically signed by Wilfred Lacy Signature Date/Time: 10/01/2022/3:21:36 PM    Final    CT Head Wo Contrast  Result Date: 09/30/2022 CLINICAL DATA:  Head trauma, minor (Age >= 65y); Neck trauma (Age >= 65y) EXAM: CT HEAD WITHOUT CONTRAST CT CERVICAL SPINE  WITHOUT CONTRAST TECHNIQUE: Multidetector CT imaging of the head and  cervical spine was performed following the standard protocol without intravenous contrast. Multiplanar CT image reconstructions of the cervical spine were also generated. RADIATION DOSE REDUCTION: This exam was performed according to the departmental dose-optimization program which includes automated exposure control, adjustment of the mA and/or kV according to patient size and/or use of iterative reconstruction technique. COMPARISON:  None Available. FINDINGS: CT HEAD FINDINGS Brain: No evidence of acute infarction, hemorrhage, hydrocephalus, extra-axial collection or mass lesion/mass effect. Sequela of moderate chronic microvascular ischemic change. Generalized volume loss. Vascular: No hyperdense vessel or unexpected calcification. Skull: Normal. Negative for fracture or focal lesion. Sinuses/Orbits: No middle ear or mastoid effusion. Paranasal sinuses are clear. Bilateral lens replacement. Orbits are otherwise unremarkable. Other: None. CT CERVICAL SPINE FINDINGS Alignment: Trace anterolisthesis of C3 on C4 and C4 on C5. Skull base and vertebrae: No acute fracture. No primary bone lesion or focal pathologic process. Soft tissues and spinal canal: No prevertebral fluid or swelling. No visible canal hematoma. Disc levels:  No evidence of high-grade spinal canal stenosis. Upper chest: There is nonspecific soft tissue density mass of the right lung apex (series 3, image 14). Other: None IMPRESSION: 1. No acute intracranial abnormality. 2. No acute fracture or traumatic subluxation of the cervical spine. 3. Nonspecific soft tissue density at the right lung apex. This is incompletely assessed due to respiratory motion artifact. Consider further evaluation with a chest CT. Electronically Signed   By: Lorenza Cambridge M.D.   On: 09/30/2022 13:44   CT Cervical Spine Wo Contrast  Result Date: 09/30/2022 CLINICAL DATA:  Head trauma, minor (Age >= 65y); Neck trauma (Age >= 65y) EXAM: CT HEAD WITHOUT CONTRAST CT CERVICAL  SPINE WITHOUT CONTRAST TECHNIQUE: Multidetector CT imaging of the head and cervical spine was performed following the standard protocol without intravenous contrast. Multiplanar CT image reconstructions of the cervical spine were also generated. RADIATION DOSE REDUCTION: This exam was performed according to the departmental dose-optimization program which includes automated exposure control, adjustment of the mA and/or kV according to patient size and/or use of iterative reconstruction technique. COMPARISON:  None Available. FINDINGS: CT HEAD FINDINGS Brain: No evidence of acute infarction, hemorrhage, hydrocephalus, extra-axial collection or mass lesion/mass effect. Sequela of moderate chronic microvascular ischemic change. Generalized volume loss. Vascular: No hyperdense vessel or unexpected calcification. Skull: Normal. Negative for fracture or focal lesion. Sinuses/Orbits: No middle ear or mastoid effusion. Paranasal sinuses are clear. Bilateral lens replacement. Orbits are otherwise unremarkable. Other: None. CT CERVICAL SPINE FINDINGS Alignment: Trace anterolisthesis of C3 on C4 and C4 on C5. Skull base and vertebrae: No acute fracture. No primary bone lesion or focal pathologic process. Soft tissues and spinal canal: No prevertebral fluid or swelling. No visible canal hematoma. Disc levels:  No evidence of high-grade spinal canal stenosis. Upper chest: There is nonspecific soft tissue density mass of the right lung apex (series 3, image 14). Other: None IMPRESSION: 1. No acute intracranial abnormality. 2. No acute fracture or traumatic subluxation of the cervical spine. 3. Nonspecific soft tissue density at the right lung apex. This is incompletely assessed due to respiratory motion artifact. Consider further evaluation with a chest CT. Electronically Signed   By: Lorenza Cambridge M.D.   On: 09/30/2022 13:44   CT ABDOMEN PELVIS WO CONTRAST  Result Date: 09/30/2022 CLINICAL DATA:  Fall at home last night. On  anticoagulation. Abdominal and back pain. EXAM: CT ABDOMEN AND PELVIS WITHOUT CONTRAST TECHNIQUE: Multidetector CT imaging  of the abdomen and pelvis was performed following the standard protocol without IV contrast. RADIATION DOSE REDUCTION: This exam was performed according to the departmental dose-optimization program which includes automated exposure control, adjustment of the mA and/or kV according to patient size and/or use of iterative reconstruction technique. COMPARISON:  11/04/2021 FINDINGS: Lower chest: New small bilateral pleural effusions. Stable cardiomegaly and small pericardial effusion. Hepatobiliary: No hepatic parenchymal injury or mass identified on this noncontrast exam. Small cyst in the lateral segment of the left hepatic lobe remains stable. Gallbladder is unremarkable. No evidence of biliary ductal dilatation. Pancreas: No parenchymal abnormality identified on this noncontrast exam. Spleen: No evidence of parenchymal injury on this noncontrast exam. Adrenal/Urinary Tract: No hemorrhage or parenchymal injury identified on this noncontrast exam. Stomach/Bowel: Diverticulosis is seen mainly involving the sigmoid colon, however there is no evidence of diverticulitis. No focal inflammatory process identified. Diffuse mesenteric and body wall edema new since previous study. Mild low-attenuation ascites is also new. Vascular/Lymphatic: No evidence of retroperitoneal hemorrhage. No pathologically enlarged lymph nodes identified. Reproductive: Several small calcified uterine fibroids are again noted largest measuring approximately 1 cm. Other: Small left inguinal hernia containing small amount of fluid remains stable. Musculoskeletal: No acute fractures or suspicious bone lesions identified. Old T12 vertebral body compression fracture is unchanged. IMPRESSION: No definite acute traumatic injury identified on this noncontrast exam. New anasarca with mild low-attenuation ascites. Colonic diverticulosis,  without radiographic evidence of diverticulitis. Stable small calcified uterine fibroids. Stable small left inguinal hernia containing small amount of fluid. Stable cardiomegaly and small pericardial effusion. Electronically Signed   By: Danae Orleans M.D.   On: 09/30/2022 13:32   DG Chest Portable 1 View  Result Date: 09/30/2022 CLINICAL DATA:  altered mental status.  Increased ankle swelling. EXAM: PORTABLE CHEST 1 VIEW COMPARISON:  09/23/2022. FINDINGS: Bilateral lung fields are clear. There is blunting of left lateral costophrenic angle, which may represent underlying trace pleural effusion. Right lateral costophrenic angle is clear. Stable cardio-mediastinal silhouette. No acute osseous abnormalities. There is a left sided 2-lead pacemaker. The soft tissues are within normal limits. Redemonstration of metallic staples along the right axillary region. IMPRESSION: Possible trace left pleural effusion. No acute cardiopulmonary abnormality. Electronically Signed   By: Jules Schick M.D.   On: 09/30/2022 11:53     Labs:   Basic Metabolic Panel: Recent Labs  Lab 09/30/22 1124 10/01/22 0357 10/02/22 0434 10/04/22 0436  NA 121* 128* 130* 129*  K 3.6 3.2* 4.4 4.3  CL 89* 94* 96* 99  CO2 21* 25 26 25   GLUCOSE 114* 100* 112* 108*  BUN 13 11 12 14   CREATININE 0.90 0.92 1.02* 0.88  CALCIUM 8.3* 7.6* 8.2* 7.9*  MG  --   --  2.2  --    GFR Estimated Creatinine Clearance: 27.6 mL/min (by C-G formula based on SCr of 0.88 mg/dL). Liver Function Tests: Recent Labs  Lab 09/30/22 1124 10/01/22 0357 10/02/22 0434  AST 50* 37 34  ALT 37 28 31  ALKPHOS 53 48 52  BILITOT 1.0 0.9 0.8  PROT 5.4* 4.6* 5.5*  ALBUMIN 3.3* 2.9* 3.4*   No results for input(s): "LIPASE", "AMYLASE" in the last 168 hours. No results for input(s): "AMMONIA" in the last 168 hours. Coagulation profile No results for input(s): "INR", "PROTIME" in the last 168 hours.  CBC: Recent Labs  Lab 09/30/22 1124 10/01/22 0357  10/02/22 0434 10/04/22 0436  WBC 7.5 5.3 7.0 3.8*  NEUTROABS 6.3  --  5.0 2.4  HGB 11.8* 10.4* 11.5* 10.2*  HCT 33.9* 31.5* 34.4* 30.5*  MCV 93.6 96.6 96.4 97.4  PLT 164 144* 185 153   Cardiac Enzymes: No results for input(s): "CKTOTAL", "CKMB", "CKMBINDEX", "TROPONINI" in the last 168 hours. BNP: Invalid input(s): "POCBNP" CBG: Recent Labs  Lab 09/30/22 1126  GLUCAP 126*   D-Dimer No results for input(s): "DDIMER" in the last 72 hours. Hgb A1c No results for input(s): "HGBA1C" in the last 72 hours. Lipid Profile No results for input(s): "CHOL", "HDL", "LDLCALC", "TRIG", "CHOLHDL", "LDLDIRECT" in the last 72 hours. Thyroid function studies No results for input(s): "TSH", "T4TOTAL", "T3FREE", "THYROIDAB" in the last 72 hours.  Invalid input(s): "FREET3" Anemia work up No results for input(s): "VITAMINB12", "FOLATE", "FERRITIN", "TIBC", "IRON", "RETICCTPCT" in the last 72 hours. Microbiology Recent Results (from the past 240 hour(s))  Blood culture (routine x 2)     Status: None   Collection Time: 09/30/22 12:04 PM   Specimen: BLOOD RIGHT FOREARM  Result Value Ref Range Status   Specimen Description   Final    BLOOD RIGHT FOREARM Performed at [facility], [address] [place], [place] [address]    Special Requests   Final    BOTTLES DRAWN AEROBIC AND ANAEROBIC Blood Culture adequate volume Performed at [facility], [address] [place], [place] [address]    Culture   Final    NO GROWTH 6 DAYS Performed at [facility], [address] [place], [place] [address]    Report Status 10/06/2022 FINAL  Final  Blood culture (routine x 2)     Status: None   Collection Time: 09/30/22 12:09 PM   Specimen: BLOOD LEFT ARM  Result Value Ref Range Status   Specimen Description   Final    BLOOD LEFT ARM Performed at [facility], [address], [place], [place] [address]    Special Requests   Final     BOTTLES DRAWN AEROBIC AND ANAEROBIC Blood Culture adequate volume Performed at [facility], [address] [place], [place] [address]    Culture   Final    NO GROWTH 6 DAYS Performed at [facility], [address], [place], [place] [address]    Report Status 10/06/2022 FINAL  Final    Time coordinating discharge: 45 minutes  Signed: Kabao Leite  [facility] 10/06/2022, 1:53 PM

## 2022-10-06 NOTE — Progress Notes (Signed)
Patient discharged: Home with family  Via: Wheelchair   Discharge paperwork given: to patient and family  Reviewed with teach back  IV removed  Belongings given to patient

## 2022-10-07 ENCOUNTER — Encounter: Payer: Self-pay | Admitting: *Deleted

## 2022-10-07 ENCOUNTER — Encounter: Payer: Self-pay | Admitting: Internal Medicine

## 2022-10-07 ENCOUNTER — Telehealth: Payer: Self-pay | Admitting: *Deleted

## 2022-10-07 DIAGNOSIS — L03116 Cellulitis of left lower limb: Secondary | ICD-10-CM | POA: Diagnosis not present

## 2022-10-07 DIAGNOSIS — K5790 Diverticulosis of intestine, part unspecified, without perforation or abscess without bleeding: Secondary | ICD-10-CM | POA: Diagnosis not present

## 2022-10-07 DIAGNOSIS — N1832 Chronic kidney disease, stage 3b: Secondary | ICD-10-CM | POA: Diagnosis not present

## 2022-10-07 DIAGNOSIS — E871 Hypo-osmolality and hyponatremia: Secondary | ICD-10-CM | POA: Diagnosis not present

## 2022-10-07 DIAGNOSIS — E039 Hypothyroidism, unspecified: Secondary | ICD-10-CM | POA: Diagnosis not present

## 2022-10-07 DIAGNOSIS — K589 Irritable bowel syndrome without diarrhea: Secondary | ICD-10-CM | POA: Diagnosis not present

## 2022-10-07 DIAGNOSIS — K219 Gastro-esophageal reflux disease without esophagitis: Secondary | ICD-10-CM | POA: Diagnosis not present

## 2022-10-07 DIAGNOSIS — Z87891 Personal history of nicotine dependence: Secondary | ICD-10-CM | POA: Diagnosis not present

## 2022-10-07 DIAGNOSIS — I341 Nonrheumatic mitral (valve) prolapse: Secondary | ICD-10-CM | POA: Diagnosis not present

## 2022-10-07 DIAGNOSIS — F419 Anxiety disorder, unspecified: Secondary | ICD-10-CM | POA: Diagnosis not present

## 2022-10-07 DIAGNOSIS — I4821 Permanent atrial fibrillation: Secondary | ICD-10-CM | POA: Diagnosis not present

## 2022-10-07 DIAGNOSIS — I890 Lymphedema, not elsewhere classified: Secondary | ICD-10-CM | POA: Diagnosis not present

## 2022-10-07 DIAGNOSIS — M810 Age-related osteoporosis without current pathological fracture: Secondary | ICD-10-CM | POA: Diagnosis not present

## 2022-10-07 DIAGNOSIS — J4520 Mild intermittent asthma, uncomplicated: Secondary | ICD-10-CM | POA: Diagnosis not present

## 2022-10-07 DIAGNOSIS — K4090 Unilateral inguinal hernia, without obstruction or gangrene, not specified as recurrent: Secondary | ICD-10-CM | POA: Diagnosis not present

## 2022-10-07 DIAGNOSIS — I129 Hypertensive chronic kidney disease with stage 1 through stage 4 chronic kidney disease, or unspecified chronic kidney disease: Secondary | ICD-10-CM | POA: Diagnosis not present

## 2022-10-07 NOTE — Transitions of Care (Post Inpatient/ED Visit) (Signed)
10/07/2022  Name: Alyssa [name] MRN: [id number] DOB: 1924/05/19  Today's TOC FU Call Status: Today's TOC FU Call Status:: Successful TOC FU Call Completed TOC FU Call Complete Date: 10/07/22 Patient's Name and Date of Birth confirmed.  Transition Care Management Follow-up Telephone Call Date of Discharge: 10/06/22 Discharge Facility: [facility]) Type of Discharge: Inpatient Admission Primary Inpatient Discharge Diagnosis:: mechanical fall at home; hyponatremia How have you been since you were released from the hospital?: Better ("I am doing better all the time; the PT person came by today and she said I did real good with my walking.  My daughter is staying with me for at least a few days and I will be moving to [place] on 10/26/22 to live in a new facility near my daughter") Any questions or concerns?: No  Items Reviewed: Did you receive and understand the discharge instructions provided?: Yes (thoroughly reviewed with patient and her daughter who verbalizes good understanding of same) Medications obtained,verified, and reconciled?: Yes (Medications Reviewed) (Full medication reconciliation/ review completed; no concerns or discrepancies identified; confirmed patient obtained/ is taking all newly Rx'd medications as instructed; daughter-manages medications; denies questions/ concerns around medications today) Any new allergies since your discharge?: No Dietary orders reviewed?: Yes Type of Diet Ordered:: "Healthy" diet Do you have support at home?: Yes People in Home: alone Name of Support/Comfort Primary Source: Reports resides alone/ independent in self-care activities at baseline; supportive neighbor and private duty caregiver assists as/ if needed/ indicated-- supportive daughter currently staying with patient for several days "at least" post-hospital discharge; daughter confirms plans are in place to move patient to [place] to live in ILF/ ALF "Continuing care  facility" on 10/26/22  Medications Reviewed Today: Medications Reviewed Today     Reviewed by Michaela Corner, RN (Registered Nurse) on 10/07/22 at 1425  Med List Status: <None>   Medication Order Taking? Sig Documenting Provider Last Dose Status Informant  acetaminophen (TYLENOL) 650 MG CR tablet [id number] Yes Take 1,300 mg by mouth 2 (two) times daily. [provider] Taking Active Pharmacy Records, Friend           Med Note Jule Economy, [place] T   Thu May 08, 2015  2:56 PM)    albuterol (VENTOLIN HFA) 108 (90 Base) MCG/ACT inhaler [id number] Yes Inhale 2 puffs into the lungs every 6 (six) hours as needed. Charlott Holler, MD Taking Active Pharmacy Records, Friend  amLODipine [facility]) 5 MG tablet [id number] Yes Take 1 tablet (5 mg total) by mouth daily. Lorin Glass, MD Taking Active   diazepam (VALIUM) 2 MG tablet [id number] Yes Take 1 tablet (2 mg total) by mouth every 8 (eight) hours as needed for anxiety. [name], MD Taking Active Pharmacy Records, Devota Pace 2.5 MG TABS tablet [id number] Yes TAKE 1 TABLET BY MOUTH TWICE A DAY Duke Salvia, MD Taking Active Pharmacy Records, Friend  levothyroxine (SYNTHROID) 50 MCG tablet [id number] Yes Take 1 tablet (50 mcg total) by mouth daily before breakfast. [name], MD Taking Active Pharmacy Records, Friend  MISC NATURAL PRODUCTS EX [id number] Yes Apply 1 application topically in the morning and at bedtime. CBD cream for back and ankle pain [provider] Taking Active Pharmacy Records, Friend  mometasone-formoterol [facility]) 100-5 MCG/ACT AERO [id number] Yes Inhale 2 puffs into the lungs 2 (two) times daily. [provider] Taking Active Pharmacy Records, Friend  omeprazole (PRILOSEC) 40 MG capsule [id number] Yes Take 1 capsule (40 mg total) by mouth daily.  Legrand Como, PA-C Taking Active Pharmacy Records, Friend  polyethylene glycol (MIRALAX / GLYCOLAX) 17 g packet [id number] Yes Take 17 g by mouth daily  as needed for moderate constipation. Lorin Glass, MD Taking Active   Propylene Glycol (SYSTANE COMPLETE OP) [id number] Yes Apply 1 drop to eye as needed (dryness for right eye). [provider] Taking Active Pharmacy Records, Friend  senna (SENOKOT) 8.6 MG TABS tablet [id number] Yes Take 1 tablet (8.6 mg total) by mouth at bedtime. Lorin Glass, MD Taking Active   Wheat Dextrin (BENEFIBER PO) [id number] Yes Take 1 Dose by mouth daily. [provider] Taking Active Pharmacy Records, Friend            Home Care and Equipment/Supplies: Were Home Health Services Ordered?: Yes Name of Home Health Agency:: [facility]: confirmed patient and daughter have contact information for home health agency: [phone] Has Agency set up a time to come to your home?: Yes First Home Health Visit Date: 10/07/22 Any new equipment or medical supplies ordered?: No  Functional Questionnaire: Do you need assistance with bathing/showering or dressing?: Yes (requires assistance after recent hospital discharge) Do you need assistance with meal preparation?: Yes (requires assistance after recent hospital discharge) Do you need assistance with eating?: No Do you have difficulty maintaining continence: No Do you need assistance with getting out of bed/getting out of a chair/moving?: No Do you have difficulty managing or taking your medications?: Yes (requires assistance after recent hospital discharge- daughter currently handling all of medications)  Follow up appointments reviewed: PCP Follow-up appointment confirmed?: Yes (care coordination outreach in real-time with scheduling care guide to successfully schedule hospital follow up PCP appointment 10/13/22 with APP/ covering provider) Date of PCP follow-up appointment?: 10/13/22 Follow-up Provider: PCP covering provider [facility] Follow-up appointment confirmed?: NA (verified not indicated per hospital discharging provider discharge  notes) Do you need transportation to your follow-up appointment?: No Do you understand care options if your condition(s) worsen?: Yes-patient verbalized understanding  SDOH Interventions Today    Flowsheet Row Most Recent Value  SDOH Interventions   Food Insecurity Interventions Intervention Not Indicated  Housing Interventions Intervention Not Indicated  [patient and her daughter confirm that plans have been made for patient to move into "Continuing Care" facility in [facility] on 10/26/22- for patient to be closer to her daughter]  Transportation Interventions Intervention Not Indicated  [normally drives self,  neighbor/ private duty care assistant, daughter all assists as/ if indicated]      TOC Interventions Today    Flowsheet Row Most Recent Value  TOC Interventions   TOC Interventions Discussed/Reviewed TOC Interventions Discussed, Arranged PCP follow up within 7 days/Care Guide scheduled  [declines need for ongoing/ further care coordination outreach, given upcoming planned move to [name] on 10/26/22,  provided my direct contact information should questions/ concerns/ needs arise post-TOC call, prior to move]      Interventions Today    Flowsheet Row Most Recent Value  Chronic Disease   Chronic disease during today's visit Other  [mechanical fall, hyponatremia]  General Interventions   General Interventions Discussed/Reviewed General Interventions Discussed, Durable Medical Equipment (DME), Doctor Visits  Doctor Visits Discussed/Reviewed Doctor Visits Discussed, PCP  Durable Medical Equipment (DME) Val Riles currently requiring/ using assistive devices - walker/ cane prn]  PCP/Specialist Visits Compliance with follow-up visit  Exercise Interventions   Exercise Discussed/Reviewed Exercise Discussed  [facility] PT- encouraged her ongoing active participation]  Education Interventions   Education Provided Provided Education  Provided  Verbal Education On  Medication  [Need to ensure patient has enough medication on hand to cover upcoming/ pending move to [place] on 10/26/22]  Nutrition Interventions   Nutrition Discussed/Reviewed Nutrition Discussed  [importance of not restricting salt in light of recent hyponatremia]  Pharmacy Interventions   Pharmacy Dicussed/Reviewed Pharmacy Topics Discussed  [Full medication review with updating medication list in EHR per patient report]  Safety Interventions   Safety Discussed/Reviewed Safety Discussed, Fall Risk      Caryl Pina, RN, BSN, CCRN Alumnus RN CM Care Coordination/ Transition of Care- [facility] Care Management [phone]: direct office

## 2022-10-11 ENCOUNTER — Encounter: Payer: Self-pay | Admitting: Internal Medicine

## 2022-10-12 ENCOUNTER — Telehealth: Payer: Self-pay | Admitting: Internal Medicine

## 2022-10-12 NOTE — Telephone Encounter (Signed)
Caller [employer]:  Tish from [facility]   Phone Number:[phone]   Needs Verbal orders for what service & frequency:  skilled nursing evaluation

## 2022-10-13 ENCOUNTER — Encounter: Payer: Self-pay | Admitting: Family Medicine

## 2022-10-13 ENCOUNTER — Ambulatory Visit (INDEPENDENT_AMBULATORY_CARE_PROVIDER_SITE_OTHER): Payer: Medicare Other | Admitting: Family Medicine

## 2022-10-13 VITALS — BP 128/82 | HR 63 | Temp 97.6°F | Ht 66.0 in

## 2022-10-13 DIAGNOSIS — I5033 Acute on chronic diastolic (congestive) heart failure: Secondary | ICD-10-CM | POA: Diagnosis not present

## 2022-10-13 DIAGNOSIS — I341 Nonrheumatic mitral (valve) prolapse: Secondary | ICD-10-CM | POA: Diagnosis not present

## 2022-10-13 DIAGNOSIS — E871 Hypo-osmolality and hyponatremia: Secondary | ICD-10-CM

## 2022-10-13 DIAGNOSIS — K4090 Unilateral inguinal hernia, without obstruction or gangrene, not specified as recurrent: Secondary | ICD-10-CM | POA: Diagnosis not present

## 2022-10-13 DIAGNOSIS — Z87891 Personal history of nicotine dependence: Secondary | ICD-10-CM | POA: Diagnosis not present

## 2022-10-13 DIAGNOSIS — K589 Irritable bowel syndrome without diarrhea: Secondary | ICD-10-CM | POA: Diagnosis not present

## 2022-10-13 DIAGNOSIS — M7989 Other specified soft tissue disorders: Secondary | ICD-10-CM | POA: Diagnosis not present

## 2022-10-13 DIAGNOSIS — N184 Chronic kidney disease, stage 4 (severe): Secondary | ICD-10-CM

## 2022-10-13 DIAGNOSIS — K219 Gastro-esophageal reflux disease without esophagitis: Secondary | ICD-10-CM | POA: Diagnosis not present

## 2022-10-13 DIAGNOSIS — M810 Age-related osteoporosis without current pathological fracture: Secondary | ICD-10-CM | POA: Diagnosis not present

## 2022-10-13 DIAGNOSIS — I890 Lymphedema, not elsewhere classified: Secondary | ICD-10-CM | POA: Diagnosis not present

## 2022-10-13 DIAGNOSIS — E876 Hypokalemia: Secondary | ICD-10-CM

## 2022-10-13 DIAGNOSIS — L03116 Cellulitis of left lower limb: Secondary | ICD-10-CM | POA: Diagnosis not present

## 2022-10-13 DIAGNOSIS — N1832 Chronic kidney disease, stage 3b: Secondary | ICD-10-CM | POA: Diagnosis not present

## 2022-10-13 DIAGNOSIS — J4520 Mild intermittent asthma, uncomplicated: Secondary | ICD-10-CM | POA: Diagnosis not present

## 2022-10-13 DIAGNOSIS — I4821 Permanent atrial fibrillation: Secondary | ICD-10-CM

## 2022-10-13 DIAGNOSIS — I129 Hypertensive chronic kidney disease with stage 1 through stage 4 chronic kidney disease, or unspecified chronic kidney disease: Secondary | ICD-10-CM | POA: Diagnosis not present

## 2022-10-13 DIAGNOSIS — F419 Anxiety disorder, unspecified: Secondary | ICD-10-CM | POA: Diagnosis not present

## 2022-10-13 DIAGNOSIS — E039 Hypothyroidism, unspecified: Secondary | ICD-10-CM | POA: Diagnosis not present

## 2022-10-13 DIAGNOSIS — W19XXXA Unspecified fall, initial encounter: Secondary | ICD-10-CM | POA: Diagnosis not present

## 2022-10-13 DIAGNOSIS — K5790 Diverticulosis of intestine, part unspecified, without perforation or abscess without bleeding: Secondary | ICD-10-CM | POA: Diagnosis not present

## 2022-10-13 LAB — BASIC METABOLIC PANEL
BUN: 28 mg/dL — ABNORMAL HIGH (ref 6–23)
CO2: 25 meq/L (ref 19–32)
Calcium: 8.4 mg/dL (ref 8.4–10.5)
Chloride: 97 meq/L (ref 96–112)
Creatinine, Ser: 0.96 mg/dL (ref 0.40–1.20)
GFR: 49.96 mL/min — ABNORMAL LOW (ref >60.00)
Glucose, Bld: 86 mg/dL (ref 70–99)
Potassium: 4.1 meq/L (ref 3.5–5.1)
Sodium: 129 meq/L — ABNORMAL LOW (ref 135–145)

## 2022-10-13 LAB — BRAIN NATRIURETIC PEPTIDE: Pro B Natriuretic peptide (BNP): 274 pg/mL — ABNORMAL HIGH (ref 0.0–100.0)

## 2022-10-13 MED ORDER — POTASSIUM CHLORIDE CRYS ER 10 MEQ PO TBCR
10.0000 meq | EXTENDED_RELEASE_TABLET | Freq: Every day | ORAL | 0 refills | Status: AC
Start: 2022-10-13 — End: ?

## 2022-10-13 MED ORDER — TORSEMIDE 5 MG PO TABS
5.0000 mg | ORAL_TABLET | Freq: Every day | ORAL | 0 refills | Status: AC
Start: 2022-10-13 — End: ?

## 2022-10-13 NOTE — Telephone Encounter (Signed)
LVM for Tish giving the ok to proceed with skilled nursing eval

## 2022-10-13 NOTE — Progress Notes (Signed)
Subjective:     Patient ID: Alyssa [name], female    DOB: Jul 29, 1924, 89 y.o.   MRN: [id number]  Chief Complaint  Patient presents with   Hospitalization Follow-up    d/c 8/28 from [facility], getting better but swelling in bilateral legs has worsened. Lots of pain    HPI  Discussed the use of AI scribe software for clinical note transcription with the patient, who gave verbal consent to proceed.  History of Present Illness         Here for hospital discharge follow up. Dr. Yetta Barre is her PCP. I am seeing her today for the first time. 2 neighbors are with her and her daughter is joining on the phone.   Mechanical fall at home and was hospitalized from 09/30/2022-10/06/2022.  Dc'ed home with home health PT.   C/o LE edema and leg pain. Diuretic was stopped during hospital stay due to hypovolemic hyponatremia and hypokalemia.  Previously taking Lasix 40 mg daily. Unclear as to whether she was taking K supplement.  Chronic LE edema.   Lives at home. PT came today. Nurse will come weekly.   Walks with a cane. Has a walker   Apparently SNF was denied by insurance.   Daughter had plans for patient to move closer to her in [place] into independent living prior to recent hospitalizations.   HTN- on amlodipine now.   A-fib on Eliquis.  She has a pacemaker.     Health Maintenance Due  Topic Date Due   COVID-19 Vaccine (5 - 2023-24 season) 10/10/2022    Past Medical History:  Diagnosis Date   Anxiety    Arthritis    Atrial fibrillation (HCC)    pacemaker, chronic anticoag   Breast cancer (HCC)    Diverticulosis    Fibroma    inner lips/mouth   GERD (gastroesophageal reflux disease)    Hx of breast cancer    Hyperkalemia    Hypertension    Hypothyroidism    IBS (irritable bowel syndrome)    Internal hemorrhoids    Left inguinal hernia    direct   MVP (mitral valve prolapse)    Osteoporosis    Renal insufficiency    Status post dilation of esophageal  narrowing    Thyroid cancer [facility])     Past Surgical History:  Procedure Laterality Date   COLONOSCOPY  10/28/2005   internal hemorrhoids, diverticulosis (same as in 2002 and random bxs negative then)   EP IMPLANTABLE DEVICE N/A 02/26/2015   Procedure: PPM Generator Changeout;  Surgeon: Duke Salvia, MD;  Location: [facility] INVASIVE CV LAB;  Service: Cardiovascular;  Laterality: N/A;   MASTECTOMY  1982   Bilateral   PACEMAKER INSERTION     THYROIDECTOMY     TONSILLECTOMY     UPPER GASTROINTESTINAL ENDOSCOPY  09/06/2000   normal    Family History  Problem Relation Age of Onset   Stroke Mother    Colon cancer Father 45   Heart disease Father    Arthritis Other    Hypertension Other    Lung cancer Son    Esophageal cancer Neg Hx    Pancreatic cancer Neg Hx    Kidney disease Neg Hx    Liver disease Neg Hx    Diabetes Neg Hx    Rectal cancer Neg Hx    Stomach cancer Neg Hx     Social History   Socioeconomic History   Marital status: Widowed    Spouse name: Not  on file   Number of children: 3   Years of education: Not on file   Highest education level: Bachelor's degree (e.g., BA, AB, BS)  Occupational History   Occupation: Retired    Occupation: retired  Tobacco Use   Smoking status: Former    Current packs/day: 0.00    Types: Cigarettes    Quit date: 02/08/1954    Years since quitting: 68.7   Smokeless tobacco: Never  Vaping Use   Vaping status: Never Used  Substance and Sexual Activity   Alcohol use: No    Alcohol/week: 0.0 standard drinks of alcohol   Drug use: No   Sexual activity: Not Currently  Other Topics Concern   Not on file  Social History Narrative   Regular Exercise: Yes   Daily Caffeine Use: Sometimes.            Social Determinants of Health   Financial Resource Strain: Low Risk  (10/17/2022)   Overall Financial Resource Strain (CARDIA)    Difficulty of Paying Living Expenses: Not hard at all  Food Insecurity: No Food Insecurity (10/17/2022)    Hunger Vital Sign    Worried About Running Out of Food in the Last Year: Never true    Ran Out of Food in the Last Year: Never true  Transportation Needs: No Transportation Needs (10/17/2022)   PRAPARE - Administrator, Civil Service (Medical): No    Lack of Transportation (Non-Medical): No  Physical Activity: Insufficiently Active (10/17/2022)   Exercise Vital Sign    Days of Exercise per Week: 2 days    Minutes of Exercise per Session: 10 min  Stress: Stress Concern Present (10/17/2022)   [employer] of Occupational Health - Occupational Stress Questionnaire    Feeling of Stress : To some extent  Social Connections: Moderately Integrated (10/17/2022)   Social Connection and Isolation Panel [NHANES]    Frequency of Communication with Friends and Family: Three times a week    Frequency of Social Gatherings with Friends and Family: Once a week    Attends Religious Services: More than 4 times per year    Active Member of Clubs or Organizations: No    Attends Engineer, structural: More than 4 times per year    Marital Status: Widowed  Intimate Partner Violence: Not At Risk (09/30/2022)   Humiliation, Afraid, Rape, and Kick questionnaire    Fear of Current or Ex-Partner: No    Emotionally Abused: No    Physically Abused: No    Sexually Abused: No    Outpatient Medications Prior to Visit  Medication Sig Dispense Refill   acetaminophen (TYLENOL) 650 MG CR tablet Take 1,300 mg by mouth 2 (two) times daily.     albuterol (VENTOLIN HFA) 108 (90 Base) MCG/ACT inhaler Inhale 2 puffs into the lungs every 6 (six) hours as needed. 18 g 5   amLODipine (NORVASC) 5 MG tablet Take 1 tablet (5 mg total) by mouth daily. 30 tablet 0   diazepam (VALIUM) 2 MG tablet Take 1 tablet (2 mg total) by mouth every 8 (eight) hours as needed for anxiety. 90 tablet 5   ELIQUIS 2.5 MG TABS tablet TAKE 1 TABLET BY MOUTH TWICE A DAY 180 tablet 1   levothyroxine (SYNTHROID) 50 MCG tablet Take 1  tablet (50 mcg total) by mouth daily before breakfast. 90 tablet 1   MISC NATURAL PRODUCTS EX Apply 1 application topically in the morning and at bedtime. CBD cream for back and ankle  pain     mometasone-formoterol (DULERA) 100-5 MCG/ACT AERO Inhale 2 puffs into the lungs 2 (two) times daily.     omeprazole (PRILOSEC) 40 MG capsule Take 1 capsule (40 mg total) by mouth daily. 30 capsule 2   polyethylene glycol (MIRALAX / GLYCOLAX) 17 g packet Take 17 g by mouth daily as needed for moderate constipation. 14 each 0   Propylene Glycol (SYSTANE COMPLETE OP) Apply 1 drop to eye as needed (dryness for right eye).     senna (SENOKOT) 8.6 MG TABS tablet Take 1 tablet (8.6 mg total) by mouth at bedtime. 120 tablet 0   Wheat Dextrin (BENEFIBER PO) Take 1 Dose by mouth daily.     No facility-administered medications prior to visit.    Allergies  Allergen Reactions   Penicillins Diarrhea    Has IBS (caused diarrhea)    Review of Systems  Constitutional:  Negative for chills and fever.  Respiratory:  Negative for shortness of breath.   Cardiovascular:  Positive for leg swelling. Negative for chest pain and palpitations.  Gastrointestinal:  Negative for abdominal pain, constipation, diarrhea, nausea and vomiting.  Genitourinary:  Negative for dysuria, frequency and urgency.  Neurological:  Negative for dizziness and focal weakness.       Objective:    Physical Exam Constitutional:      General: She is not in acute distress.    Appearance: She is not ill-appearing.     Comments: Sitting in wheelchair  HENT:     Mouth/Throat:     Mouth: Mucous membranes are moist.     Pharynx: Oropharynx is clear.  Eyes:     Extraocular Movements: Extraocular movements intact.     Conjunctiva/sclera: Conjunctivae normal.  Cardiovascular:     Rate and Rhythm: Normal rate.  Pulmonary:     Effort: Pulmonary effort is normal.     Breath sounds: Normal breath sounds.  Musculoskeletal:     Cervical back:  Normal range of motion and neck supple.     Right lower leg: Edema present.     Left lower leg: Edema present.     Comments: Bilateral LE edema, pitting with hematoma of left shin. Legs are TTP. Cap refill brisk.   Skin:    General: Skin is warm and dry.  Neurological:     General: No focal deficit present.     Mental Status: She is alert and oriented to person, place, and time.     Motor: No weakness.  Psychiatric:        Mood and Affect: Mood normal.        Behavior: Behavior normal.      BP 128/82 (BP Location: Left Arm, Patient Position: Sitting, Cuff Size: Normal)   Pulse 63   Temp 97.6 F (36.4 C) (Temporal)   Ht 5\' 6"  (1.676 m)   SpO2 95%   BMI 16.62 kg/m  Wt Readings from Last 3 Encounters:  10/15/22 103 lb (46.7 kg)  09/30/22 103 lb (46.7 kg)  09/23/22 109 lb 14.4 oz (49.9 kg)       Assessment & Plan:   Problem List Items Addressed This Visit       Cardiovascular and Mediastinum   (HFpEF) heart failure with preserved ejection fraction (HCC)   Relevant Medications   torsemide (DEMADEX) 5 MG tablet   Other Relevant Orders   Basic metabolic panel (Completed)   Brain natriuretic peptide (Completed)   Ambulatory referral to Home Health   Permanent atrial fibrillation (HCC)  Relevant Medications   torsemide (DEMADEX) 5 MG tablet   Other Relevant Orders   Ambulatory referral to Home Health     Genitourinary   Chronic kidney disease, stage 4, severely decreased GFR (HCC)   Relevant Orders   Basic metabolic panel (Completed)   Ambulatory referral to Home Health     Other   Hypokalemia   Relevant Medications   potassium chloride (KLOR-CON M) 10 MEQ tablet   Other Relevant Orders   Ambulatory referral to Home Health   Hyponatremia   Relevant Orders   Ambulatory referral to Home Health   Other Visit Diagnoses     Leg swelling    -  Primary   Relevant Medications   torsemide (DEMADEX) 5 MG tablet   potassium chloride (KLOR-CON M) 10 MEQ tablet    Other Relevant Orders   Basic metabolic panel (Completed)   Brain natriuretic peptide (Completed)   Ambulatory referral to Home Health   Fall, initial encounter       Relevant Orders   Ambulatory referral to Home Health      This is my first encounter with this pleasant 89 yr old female. I am communicating with her daughter via telephone and her neighbors are with her.  She is not in any acute distress but her legs are painful from a significant amount of edema bilaterally.  Reviewed hospital discharge summary, reviewed all results and reconciled medications.  Recheck CBC, BNP.  Torsemide prescribed along with potassium, discussed importance of taking potassium supplement.  Home health referral placed.  Independent living paperwork requested for me to fill out but explained that she is need of quite a bit of assistance at this time and in my opinion does not meet independent living status. Recommend follow up with PCP next week.   I am having Thera Flake start on torsemide and potassium chloride. I am also having her maintain her acetaminophen, MISC NATURAL PRODUCTS EX, Propylene Glycol (SYSTANE COMPLETE OP), Eliquis, levothyroxine, diazepam, albuterol, Wheat Dextrin (BENEFIBER PO), Dulera, omeprazole, amLODipine, polyethylene glycol, and senna.  Meds ordered this encounter  Medications   torsemide (DEMADEX) 5 MG tablet    Sig: Take 1 tablet (5 mg total) by mouth daily.    Dispense:  30 tablet    Refill:  0    Order Specific Question:   Supervising Provider    Answer:   Hillard Danker A [[id number]]   potassium chloride (KLOR-CON M) 10 MEQ tablet    Sig: Take 1 tablet (10 mEq total) by mouth daily.    Dispense:  30 tablet    Refill:  0    Order Specific Question:   Supervising Provider    Answer:   Hillard Danker A [[id number]]

## 2022-10-14 ENCOUNTER — Encounter: Payer: Self-pay | Admitting: Internal Medicine

## 2022-10-15 ENCOUNTER — Emergency Department (HOSPITAL_COMMUNITY)
Admission: EM | Admit: 2022-10-15 | Discharge: 2022-10-15 | Disposition: A | Discharge status: Home / Self Care | Payer: Medicare Other | Attending: Student | Admitting: Student

## 2022-10-15 ENCOUNTER — Encounter: Payer: Self-pay | Admitting: Internal Medicine

## 2022-10-15 ENCOUNTER — Emergency Department (HOSPITAL_COMMUNITY): Payer: Medicare Other

## 2022-10-15 ENCOUNTER — Other Ambulatory Visit: Payer: Self-pay

## 2022-10-15 ENCOUNTER — Emergency Department (HOSPITAL_BASED_OUTPATIENT_CLINIC_OR_DEPARTMENT_OTHER): Payer: Medicare Other

## 2022-10-15 DIAGNOSIS — Z79899 Other long term (current) drug therapy: Secondary | ICD-10-CM | POA: Insufficient documentation

## 2022-10-15 DIAGNOSIS — I5030 Unspecified diastolic (congestive) heart failure: Secondary | ICD-10-CM | POA: Insufficient documentation

## 2022-10-15 DIAGNOSIS — M7989 Other specified soft tissue disorders: Secondary | ICD-10-CM

## 2022-10-15 DIAGNOSIS — R2240 Localized swelling, mass and lump, unspecified lower limb: Secondary | ICD-10-CM | POA: Diagnosis not present

## 2022-10-15 DIAGNOSIS — D72819 Decreased white blood cell count, unspecified: Secondary | ICD-10-CM | POA: Diagnosis not present

## 2022-10-15 DIAGNOSIS — I130 Hypertensive heart and chronic kidney disease with heart failure and stage 1 through stage 4 chronic kidney disease, or unspecified chronic kidney disease: Secondary | ICD-10-CM | POA: Insufficient documentation

## 2022-10-15 DIAGNOSIS — M170 Bilateral primary osteoarthritis of knee: Secondary | ICD-10-CM | POA: Diagnosis not present

## 2022-10-15 DIAGNOSIS — M1129 Other chondrocalcinosis, multiple sites: Secondary | ICD-10-CM | POA: Diagnosis not present

## 2022-10-15 DIAGNOSIS — I10 Essential (primary) hypertension: Secondary | ICD-10-CM | POA: Diagnosis not present

## 2022-10-15 DIAGNOSIS — Z85850 Personal history of malignant neoplasm of thyroid: Secondary | ICD-10-CM | POA: Insufficient documentation

## 2022-10-15 DIAGNOSIS — E039 Hypothyroidism, unspecified: Secondary | ICD-10-CM | POA: Diagnosis not present

## 2022-10-15 DIAGNOSIS — I959 Hypotension, unspecified: Secondary | ICD-10-CM | POA: Diagnosis not present

## 2022-10-15 DIAGNOSIS — Z950 Presence of cardiac pacemaker: Secondary | ICD-10-CM | POA: Diagnosis not present

## 2022-10-15 DIAGNOSIS — N184 Chronic kidney disease, stage 4 (severe): Secondary | ICD-10-CM | POA: Diagnosis not present

## 2022-10-15 DIAGNOSIS — Z7901 Long term (current) use of anticoagulants: Secondary | ICD-10-CM | POA: Diagnosis not present

## 2022-10-15 DIAGNOSIS — Z87891 Personal history of nicotine dependence: Secondary | ICD-10-CM | POA: Diagnosis not present

## 2022-10-15 DIAGNOSIS — R609 Edema, unspecified: Secondary | ICD-10-CM | POA: Diagnosis not present

## 2022-10-15 DIAGNOSIS — M85862 Other specified disorders of bone density and structure, left lower leg: Secondary | ICD-10-CM | POA: Diagnosis not present

## 2022-10-15 DIAGNOSIS — Z853 Personal history of malignant neoplasm of breast: Secondary | ICD-10-CM | POA: Diagnosis not present

## 2022-10-15 DIAGNOSIS — R600 Localized edema: Secondary | ICD-10-CM | POA: Diagnosis not present

## 2022-10-15 LAB — CBC WITH DIFFERENTIAL/PLATELET
Abs Immature Granulocytes: 0.01 10*3/uL (ref 0.00–0.07)
Basophils Absolute: 0 10*3/uL (ref 0.0–0.1)
Basophils Relative: 1 %
Eosinophils Absolute: 0 10*3/uL (ref 0.0–0.5)
Eosinophils Relative: 1 %
HCT: 34.4 % — ABNORMAL LOW (ref 36.0–46.0)
Hemoglobin: 11.4 g/dL — ABNORMAL LOW (ref 12.0–15.0)
Immature Granulocytes: 0 %
Lymphocytes Relative: 20 %
Lymphs Abs: 0.7 10*3/uL (ref 0.7–4.0)
MCH: 32.4 pg (ref 26.0–34.0)
MCHC: 33.1 g/dL (ref 30.0–36.0)
MCV: 97.7 fL (ref 80.0–100.0)
Monocytes Absolute: 0.2 10*3/uL (ref 0.1–1.0)
Monocytes Relative: 6 %
Neutro Abs: 2.7 10*3/uL (ref 1.7–7.7)
Neutrophils Relative %: 72 %
Platelets: 240 10*3/uL (ref 150–400)
RBC: 3.52 MIL/uL — ABNORMAL LOW (ref 3.87–5.11)
RDW: 12.6 % (ref 11.5–15.5)
WBC: 3.7 10*3/uL — ABNORMAL LOW (ref 4.0–10.5)
nRBC: 0 % (ref 0.0–0.2)

## 2022-10-15 LAB — COMPREHENSIVE METABOLIC PANEL
ALT: 39 U/L (ref 0–44)
AST: 46 U/L — ABNORMAL HIGH (ref 15–41)
Albumin: 3.5 g/dL (ref 3.5–5.0)
Alkaline Phosphatase: 71 U/L (ref 38–126)
Anion gap: 13 (ref 5–15)
BUN: 22 mg/dL (ref 8–23)
CO2: 24 mmol/L (ref 22–32)
Calcium: 8.5 mg/dL — ABNORMAL LOW (ref 8.9–10.3)
Chloride: 98 mmol/L (ref 98–111)
Creatinine, Ser: 1.06 mg/dL — ABNORMAL HIGH (ref 0.44–1.00)
GFR, Estimated: 48 mL/min — ABNORMAL LOW (ref >60)
Glucose, Bld: 140 mg/dL — ABNORMAL HIGH (ref 70–99)
Potassium: 4.1 mmol/L (ref 3.5–5.1)
Sodium: 135 mmol/L (ref 135–145)
Total Bilirubin: 0.4 mg/dL (ref 0.3–1.2)
Total Protein: 5.9 g/dL — ABNORMAL LOW (ref 6.5–8.1)

## 2022-10-15 LAB — BRAIN NATRIURETIC PEPTIDE: B Natriuretic Peptide: 238.2 pg/mL — ABNORMAL HIGH (ref 0.0–100.0)

## 2022-10-15 NOTE — ED Provider Notes (Signed)
[place] EMERGENCY DEPARTMENT AT [facility] Provider Note  CSN: [id number] Arrival date & time: 10/15/22 1141  Chief Complaint(s) Leg Swelling and Leg Pain  HPI Alyssa [name] is a 89 y.o. female with PMH HTN, chronic A-fib with pacemaker placement on Eliquis, CKD, GERD, hypothyroidism, chronic lower extremity edema, IBS who presents emergency department for evaluation of lower extremity edema, pain and discoloration.  Patient recently admitted for symptomatic hyponatremia secondary to her diuretic use.  She had been off of this medication for multiple weeks but recently restarted her torsemide yesterday.  She has had discoloration and bruising of her lower extremities for some time and neighbor is showing pictures of similar discoloration going back into August.  Her physical therapist reportedly told the patient to stop using her compression stockings because they were causing her leg discoloration and thus she has not been using any compression stockings.  Patient arrives with a hematoma over the left shin but denies any trauma.  Is able to ambulate.  Denies chest pain, shortness of breath, abdominal pain, nausea, vomiting or other systemic symptoms.     Past Medical History Past Medical History:  Diagnosis Date   Anxiety    Arthritis    Atrial fibrillation (HCC)    pacemaker, chronic anticoag   Breast cancer (HCC)    Diverticulosis    Fibroma    inner lips/mouth   GERD (gastroesophageal reflux disease)    Hx of breast cancer    Hyperkalemia    Hypertension    Hypothyroidism    IBS (irritable bowel syndrome)    Internal hemorrhoids    Left inguinal hernia    direct   MVP (mitral valve prolapse)    Osteoporosis    Renal insufficiency    Status post dilation of esophageal narrowing    Thyroid cancer (HCC)    Patient Active Problem List   Diagnosis Date Noted   Dysphagia 09/23/2022   Hypokalemia 09/23/2022   Chronic kidney disease, stage 4, severely decreased  GFR (HCC) 07/08/2022   Mild asthma 04/08/2022   Direct inguinal hernia of left side 07/28/2021   Intestinal bacterial overgrowth 07/13/2021   Encounter for general adult medical examination with abnormal findings 02/25/2021   Iron deficiency anemia due to chronic blood loss 03/19/2020   Hypoproteinemia (HCC) 03/13/2020   Chronic right-sided low back pain without sciatica 05/09/2019   Fracture of vertebra due to osteoporosis (HCC) 12/28/2018   IBS (irritable bowel syndrome) 02/20/2018   Hyponatremia 02/20/2018   Seasonal allergic rhinitis due to pollen 11/25/2016   (HFpEF) heart failure with preserved ejection fraction (HCC) 08/13/2016   GAD (generalized anxiety disorder) 02/18/2016   Essential hypertension, benign 09/04/2015   Severe tricuspid regurgitation 04/22/2015   Complete heart block (HCC) 02/26/2015   Permanent atrial fibrillation (HCC) 10/11/2012   Chronic kidney disease, stage 3b (HCC) 06/12/2012   Pacemaker-dual chamber  06/23/2010   GERD 06/10/2009   Hypothyroidism 02/26/2008   OSTEOPOROSIS 02/26/2008   Home Medication(s) Prior to Admission medications   Medication Sig Start Date End Date Taking? Authorizing Provider  acetaminophen (TYLENOL) 650 MG CR tablet Take 1,300 mg by mouth 2 (two) times daily.    [provider]  albuterol (VENTOLIN HFA) 108 (90 Base) MCG/ACT inhaler Inhale 2 puffs into the lungs every 6 (six) hours as needed. 08/03/22   Charlott Holler, MD  amLODipine (NORVASC) 5 MG tablet Take 1 tablet (5 mg total) by mouth daily. 10/07/22 11/06/22  Lorin Glass, MD  diazepam (VALIUM)  2 MG tablet Take 1 tablet (2 mg total) by mouth every 8 (eight) hours as needed for anxiety. 07/09/22   [name], MD  ELIQUIS 2.5 MG TABS tablet TAKE 1 TABLET BY MOUTH TWICE A DAY 05/31/22   Duke Salvia, MD  levothyroxine (SYNTHROID) 50 MCG tablet Take 1 tablet (50 mcg total) by mouth daily before breakfast. 07/08/22   [name], MD  MISC NATURAL PRODUCTS EX  Apply 1 application topically in the morning and at bedtime. CBD cream for back and ankle pain    [provider]  mometasone-formoterol (DULERA) 100-5 MCG/ACT AERO Inhale 2 puffs into the lungs 2 (two) times daily.    [provider]  omeprazole (PRILOSEC) 40 MG capsule Take 1 capsule (40 mg total) by mouth daily. 09/20/22   McMichael, Saddie Benders, PA-C  polyethylene glycol (MIRALAX / GLYCOLAX) 17 g packet Take 17 g by mouth daily as needed for moderate constipation. 10/06/22   Lorin Glass, MD  potassium chloride (KLOR-CON M) 10 MEQ tablet Take 1 tablet (10 mEq total) by mouth daily. 10/13/22   Henson, Vickie L, NP-C  Propylene Glycol (SYSTANE COMPLETE OP) Apply 1 drop to eye as needed (dryness for right eye).    [provider]  senna (SENOKOT) 8.6 MG TABS tablet Take 1 tablet (8.6 mg total) by mouth at bedtime. 10/06/22   Lorin Glass, MD  torsemide (DEMADEX) 5 MG tablet Take 1 tablet (5 mg total) by mouth daily. 10/13/22   Henson, Vickie L, NP-C  Wheat Dextrin (BENEFIBER PO) Take 1 Dose by mouth daily.    [provider]                                                                                                                                    Past Surgical History Past Surgical History:  Procedure Laterality Date   COLONOSCOPY  10/28/2005   internal hemorrhoids, diverticulosis (same as in 2002 and random bxs negative then)   EP IMPLANTABLE DEVICE N/A 02/26/2015   Procedure: PPM Generator Changeout;  Surgeon: Duke Salvia, MD;  Location: [facility] INVASIVE CV LAB;  Service: Cardiovascular;  Laterality: N/A;   MASTECTOMY  1982   Bilateral   PACEMAKER INSERTION     THYROIDECTOMY     TONSILLECTOMY     UPPER GASTROINTESTINAL ENDOSCOPY  09/06/2000   normal   Family History Family History  Problem Relation Age of Onset   Stroke Mother    Colon cancer Father 2   Heart disease Father    Arthritis Other    Hypertension Other    Lung cancer Son    Esophageal  cancer Neg Hx    Pancreatic cancer Neg Hx    Kidney disease Neg Hx    Liver disease Neg Hx    Diabetes Neg Hx    Rectal cancer Neg Hx    Stomach cancer Neg Hx  Social History Social History   Tobacco Use   Smoking status: Former    Current packs/day: 0.00    Types: Cigarettes    Quit date: 02/08/1954    Years since quitting: 68.7   Smokeless tobacco: Never  Vaping Use   Vaping status: Never Used  Substance Use Topics   Alcohol use: No    Alcohol/week: 0.0 standard drinks of alcohol   Drug use: No   Allergies Penicillins  Review of Systems Review of Systems  Cardiovascular:  Positive for leg swelling.  Skin:  Positive for color change and wound.    Physical Exam Vital Signs  I have reviewed the triage vital signs BP (!) 140/58   Pulse (!) 59   Temp 98 F (36.7 C)   Resp 14   Ht 5\' 6"  (1.676 m)   Wt 46.7 kg   SpO2 91%   BMI 16.62 kg/m   Physical Exam Vitals and nursing note reviewed.  Constitutional:      General: She is not in acute distress.    Appearance: She is well-developed.  HENT:     Head: Normocephalic and atraumatic.  Eyes:     Conjunctiva/sclera: Conjunctivae normal.  Cardiovascular:     Rate and Rhythm: Normal rate and regular rhythm.     Heart sounds: No murmur heard. Pulmonary:     Effort: Pulmonary effort is normal. No respiratory distress.     Breath sounds: Normal breath sounds.  Abdominal:     Palpations: Abdomen is soft.     Tenderness: There is no abdominal tenderness.  Musculoskeletal:        General: No swelling.     Cervical back: Neck supple.     Right lower leg: Edema present.     Left lower leg: Edema present.  Skin:    General: Skin is warm and dry.     Capillary Refill: Capillary refill takes less than 2 seconds.     Findings: Bruising present.  Neurological:     Mental Status: She is alert.  Psychiatric:        Mood and Affect: Mood normal.      ED Results and Treatments Labs (all labs ordered are  listed, but only abnormal results are displayed) Labs Reviewed  COMPREHENSIVE METABOLIC PANEL - Abnormal; Notable for the following components:      Result Value   Glucose, Bld 140 (*)    Creatinine, Ser 1.06 (*)    Calcium 8.5 (*)    Total Protein 5.9 (*)    AST 46 (*)    GFR, Estimated 48 (*)    All other components within normal limits  CBC WITH DIFFERENTIAL/PLATELET - Abnormal; Notable for the following components:   WBC 3.7 (*)    RBC 3.52 (*)    Hemoglobin 11.4 (*)    HCT 34.4 (*)    All other components within normal limits  BRAIN NATRIURETIC PEPTIDE - Abnormal; Notable for the following components:   B Natriuretic Peptide 238.2 (*)    All other components within normal limits  Radiology DG Tibia/Fibula Left  Result Date: 10/15/2022 CLINICAL DATA:  Bilateral leg swelling for 8 days, left-greater-than-right EXAM: LEFT TIBIA AND FIBULA - 2 VIEW; RIGHT TIBIA AND FIBULA - 2 VIEW COMPARISON:  None Available. FINDINGS: Osteopenia. No fracture or dislocation bilaterally. Soft tissue swelling distally about the lower legs and ankles. Note is made of chondrocalcinosis of the knee joint at the edge of the imaging field bilaterally. Some degenerative changes of the knees. Scattered vascular calcifications. IMPRESSION: Soft tissue swelling.  Chronic changes. Electronically Signed   By: Karen Kays M.D.   On: 10/15/2022 15:49   DG Tibia/Fibula Right  Result Date: 10/15/2022 CLINICAL DATA:  Bilateral leg swelling for 8 days, left-greater-than-right EXAM: LEFT TIBIA AND FIBULA - 2 VIEW; RIGHT TIBIA AND FIBULA - 2 VIEW COMPARISON:  None Available. FINDINGS: Osteopenia. No fracture or dislocation bilaterally. Soft tissue swelling distally about the lower legs and ankles. Note is made of chondrocalcinosis of the knee joint at the edge of the imaging field bilaterally. Some  degenerative changes of the knees. Scattered vascular calcifications. IMPRESSION: Soft tissue swelling.  Chronic changes. Electronically Signed   By: Karen Kays M.D.   On: 10/15/2022 15:49   VAS [place] LOWER EXTREMITY VENOUS (DVT) (ONLY [facility] & [facility])  Result Date: 10/15/2022  Lower Venous DVT Study Patient Name:  MARIYAM GALESKI  Date of Exam:   10/15/2022 Medical Rec #: [id number]          Accession #:    [id number] Date of Birth: February 26, 1924          Patient Gender: F Patient Age:   26 years Exam Location:  [facility] Procedure:      VAS [place] LOWER EXTREMITY VENOUS (DVT) Referring Phys: Pennie Vanblarcom [facility] --------------------------------------------------------------------------------  Indications: Swelling.  Risk Factors: Past pregnancy. Comparison Study: No prior study Performing Technologist: Shona Simpson  Examination Guidelines: A complete evaluation includes B-mode imaging, spectral Doppler, color Doppler, and power Doppler as needed of all accessible portions of each vessel. Bilateral testing is considered an integral part of a complete examination. Limited examinations for reoccurring indications may be performed as noted. The reflux portion of the exam is performed with the patient in reverse Trendelenburg.  +---------+---------------+---------+-----------+----------+--------------+ RIGHT    CompressibilityPhasicitySpontaneityPropertiesThrombus Aging +---------+---------------+---------+-----------+----------+--------------+ CFV      Full           Yes      Yes                                 +---------+---------------+---------+-----------+----------+--------------+ SFJ      Full                                                        +---------+---------------+---------+-----------+----------+--------------+ FV Prox  Full                                                        +---------+---------------+---------+-----------+----------+--------------+ FV Mid   Full                                                         +---------+---------------+---------+-----------+----------+--------------+  FV DistalFull                                                        +---------+---------------+---------+-----------+----------+--------------+ PFV      Full                                                        +---------+---------------+---------+-----------+----------+--------------+ POP      Full           Yes      Yes                                 +---------+---------------+---------+-----------+----------+--------------+ PTV      Full                                                        +---------+---------------+---------+-----------+----------+--------------+ PERO     Full                                                        +---------+---------------+---------+-----------+----------+--------------+   +---------+---------------+---------+-----------+----------+--------------+ LEFT     CompressibilityPhasicitySpontaneityPropertiesThrombus Aging +---------+---------------+---------+-----------+----------+--------------+ CFV      Full           Yes      Yes                                 +---------+---------------+---------+-----------+----------+--------------+ SFJ      Full                                                        +---------+---------------+---------+-----------+----------+--------------+ FV Prox  Full                                                        +---------+---------------+---------+-----------+----------+--------------+ FV Mid   Full                                                        +---------+---------------+---------+-----------+----------+--------------+ FV DistalFull                                                        +---------+---------------+---------+-----------+----------+--------------+  PFV      Full                                                         +---------+---------------+---------+-----------+----------+--------------+ POP      Full           Yes      Yes                                 +---------+---------------+---------+-----------+----------+--------------+ PTV      Full                                                        +---------+---------------+---------+-----------+----------+--------------+ PERO     Full                                                        +---------+---------------+---------+-----------+----------+--------------+     Summary: BILATERAL: - No evidence of deep vein thrombosis seen in the lower extremities, bilaterally. -No evidence of popliteal cyst, bilaterally.   *See table(s) above for measurements and observations.    Preliminary     Pertinent labs & imaging results that were available during my care of the patient were reviewed by me and considered in my medical decision making (see MDM for details).  Medications Ordered in ED Medications - No data to display                                                                                                                                   Procedures Procedures  (including critical care time)  Medical Decision Making / ED Course   This patient presents to the ED for concern of lower extremity edema, bruising, this involves an extensive number of treatment options, and is a complaint that carries with it a high risk of complications and morbidity.  The differential diagnosis includes hematoma, DVT, phlegmasia, fracture, cellulitis, venous insufficiency  MDM: Patient seen emerged part for evaluation of lower extremity edema and bruising.  Physical exam with a hematoma over the left shin with bilateral lower extremity pitting edema.  Laboratory evaluation without BNP elevation to 238 but this is not far from patient's baseline, creatinine 1.06, mild leukopenia 3.7, hemoglobin 11.4.  X-rays of bilateral lower extremities with no fractures  or soft tissue gas.  DVT ultrasound negative bilaterally.  Suspect gravity dependent edema, venous  insufficiency and superficial hematoma.  The lower extremity is not tense and she has no neurologic deficits.  I have very low suspicion for compartment syndrome.  Patient placed in TED hose for compression and was encouraged to continue with her outpatient diuresis.  At this time she does not meet inpatient criteria for admission she is safe for discharge with outpatient follow-up.   Additional history obtained: -Additional history obtained from neighbor -External records from outside source obtained and reviewed including: Chart review including previous notes, labs, imaging, consultation notes   Lab Tests: -I ordered, reviewed, and interpreted labs.   The pertinent results include:   Labs Reviewed  COMPREHENSIVE METABOLIC PANEL - Abnormal; Notable for the following components:      Result Value   Glucose, Bld 140 (*)    Creatinine, Ser 1.06 (*)    Calcium 8.5 (*)    Total Protein 5.9 (*)    AST 46 (*)    GFR, Estimated 48 (*)    All other components within normal limits  CBC WITH DIFFERENTIAL/PLATELET - Abnormal; Notable for the following components:   WBC 3.7 (*)    RBC 3.52 (*)    Hemoglobin 11.4 (*)    HCT 34.4 (*)    All other components within normal limits  BRAIN NATRIURETIC PEPTIDE - Abnormal; Notable for the following components:   B Natriuretic Peptide 238.2 (*)    All other components within normal limits      EKG   EKG Interpretation Date/Time:  Friday October 15 2022 11:56:05 EDT Ventricular Rate:  63 PR Interval:    QRS Duration:  161 QT Interval:  482 QTC Calculation: 494 R Axis:   -83  Text Interpretation: v-paced rhythm   Confirmed by Rilee Wendling ([id number]) on 10/15/2022 6:58:18 PM         Imaging Studies ordered: I ordered imaging studies including x-ray tib-fib bilaterally, DVT ultrasound I independently visualized and interpreted imaging. I  agree with the radiologist interpretation   Medicines ordered and prescription drug management: No orders of the defined types were placed in this encounter.   -I have reviewed the patients home medicines and have made adjustments as needed  Critical interventions none    Cardiac Monitoring: The patient was maintained on a cardiac monitor.  I personally viewed and interpreted the cardiac monitored which showed an underlying rhythm of: V paced  Social Determinants of Health:  Factors impacting patients care include: Plans to move to [facility] in the next few weeks   Reevaluation: After the interventions noted above, I reevaluated the patient and found that they have :improved  Co morbidities that complicate the patient evaluation  Past Medical History:  Diagnosis Date   Anxiety    Arthritis    Atrial fibrillation (HCC)    pacemaker, chronic anticoag   Breast cancer (HCC)    Diverticulosis    Fibroma    inner lips/mouth   GERD (gastroesophageal reflux disease)    Hx of breast cancer    Hyperkalemia    Hypertension    Hypothyroidism    IBS (irritable bowel syndrome)    Internal hemorrhoids    Left inguinal hernia    direct   MVP (mitral valve prolapse)    Osteoporosis    Renal insufficiency    Status post dilation of esophageal narrowing    Thyroid cancer (HCC)       Dispostion: I considered admission for this patient, but at this time she does not meet inpatient criteria for  admission she is safe for discharge with outpatient follow-up     Final Clinical Impression(s) / ED Diagnoses Final diagnoses:  None     @PCDICTATION @    Glendora Score, MD 10/15/22 1858

## 2022-10-15 NOTE — ED Triage Notes (Signed)
Patient brought in by [facility] from home for leg swelling and leg pain. Patient hospitalized last month for low sodium and potassium and instructed to not take lasix. Patient had low dose of Lasix yesterday for first time in month. Patient has hx of heart failure and stage 4 kidney disease. Patient has left leg hematoma but does not recall hitting leg or striking any objects.   136/58 BP 66 HR 97 RA

## 2022-10-17 ENCOUNTER — Encounter: Payer: Self-pay | Admitting: Internal Medicine

## 2022-10-18 DIAGNOSIS — K4090 Unilateral inguinal hernia, without obstruction or gangrene, not specified as recurrent: Secondary | ICD-10-CM | POA: Diagnosis not present

## 2022-10-18 DIAGNOSIS — N1832 Chronic kidney disease, stage 3b: Secondary | ICD-10-CM | POA: Diagnosis not present

## 2022-10-18 DIAGNOSIS — I129 Hypertensive chronic kidney disease with stage 1 through stage 4 chronic kidney disease, or unspecified chronic kidney disease: Secondary | ICD-10-CM | POA: Diagnosis not present

## 2022-10-18 DIAGNOSIS — L03116 Cellulitis of left lower limb: Secondary | ICD-10-CM | POA: Diagnosis not present

## 2022-10-18 DIAGNOSIS — J4520 Mild intermittent asthma, uncomplicated: Secondary | ICD-10-CM | POA: Diagnosis not present

## 2022-10-18 DIAGNOSIS — I4821 Permanent atrial fibrillation: Secondary | ICD-10-CM | POA: Diagnosis not present

## 2022-10-18 DIAGNOSIS — K589 Irritable bowel syndrome without diarrhea: Secondary | ICD-10-CM | POA: Diagnosis not present

## 2022-10-18 DIAGNOSIS — I890 Lymphedema, not elsewhere classified: Secondary | ICD-10-CM | POA: Diagnosis not present

## 2022-10-18 DIAGNOSIS — K219 Gastro-esophageal reflux disease without esophagitis: Secondary | ICD-10-CM | POA: Diagnosis not present

## 2022-10-18 DIAGNOSIS — M810 Age-related osteoporosis without current pathological fracture: Secondary | ICD-10-CM | POA: Diagnosis not present

## 2022-10-18 DIAGNOSIS — Z87891 Personal history of nicotine dependence: Secondary | ICD-10-CM | POA: Diagnosis not present

## 2022-10-18 DIAGNOSIS — F419 Anxiety disorder, unspecified: Secondary | ICD-10-CM | POA: Diagnosis not present

## 2022-10-18 DIAGNOSIS — E871 Hypo-osmolality and hyponatremia: Secondary | ICD-10-CM | POA: Diagnosis not present

## 2022-10-18 DIAGNOSIS — E039 Hypothyroidism, unspecified: Secondary | ICD-10-CM | POA: Diagnosis not present

## 2022-10-18 DIAGNOSIS — K5790 Diverticulosis of intestine, part unspecified, without perforation or abscess without bleeding: Secondary | ICD-10-CM | POA: Diagnosis not present

## 2022-10-18 DIAGNOSIS — I341 Nonrheumatic mitral (valve) prolapse: Secondary | ICD-10-CM | POA: Diagnosis not present

## 2022-10-19 ENCOUNTER — Ambulatory Visit (INDEPENDENT_AMBULATORY_CARE_PROVIDER_SITE_OTHER): Payer: Medicare Other | Admitting: Internal Medicine

## 2022-10-19 ENCOUNTER — Encounter: Payer: Self-pay | Admitting: Internal Medicine

## 2022-10-19 VITALS — BP 136/72 | HR 83 | Temp 97.9°F | Ht 66.0 in | Wt 127.0 lb

## 2022-10-19 DIAGNOSIS — L089 Local infection of the skin and subcutaneous tissue, unspecified: Secondary | ICD-10-CM | POA: Diagnosis not present

## 2022-10-19 DIAGNOSIS — T148XXA Other injury of unspecified body region, initial encounter: Secondary | ICD-10-CM | POA: Diagnosis not present

## 2022-10-19 DIAGNOSIS — I10 Essential (primary) hypertension: Secondary | ICD-10-CM | POA: Diagnosis not present

## 2022-10-19 NOTE — Progress Notes (Signed)
Subjective:  Patient ID: Alyssa [name], female    DOB: 09-16-1924  Age: 89 y.o. MRN: [id number]  CC: Anemia and Hypothyroidism   HPI Alyssa [name] presents for f/up ----  Discussed the use of AI scribe software for clinical note transcription with the patient, who gave verbal consent to proceed.  History of Present Illness   The patient, a 89 year old with a history of multiple medical conditions, reports overall good health and independence in daily activities. She is currently living independently with a neighbor checking in on her, but does not have home healthcare. She expresses satisfaction with her current living situation and denies interest in palliative care.  The patient is planning to move to an independent living facility in [place], which is two blocks from her daughter. This move is scheduled for the 18th of the current month. The facility offers step-up care as needed, providing the patient with the opportunity to transition to assisted living if necessary in the future.  The patient is able to perform all activities of daily living independently, including dressing, bathing, eating, toileting, and ambulating. She is also able to perform light daily tasks such as dishwashing, bed making, and laundry. She is able to prepare light meals and shop for groceries. She administers her own medications in correct dosages and recognizes her own health needs, scheduling her own medical appointments. She manages her own bowel movements and does not have a special diet. She is able to perceive an emergency and make an exit out of the building without assistance.  The patient has a history of leg issues, but the current status of this condition is not discussed in detail. She has not required nursing or convalescent care at this time. The patient has experienced the loss of two sons and expresses a desire to be closer to her daughter.       Outpatient Medications Prior to  Visit  Medication Sig Dispense Refill   acetaminophen (TYLENOL) 650 MG CR tablet Take 1,300 mg by mouth 2 (two) times daily.     albuterol (VENTOLIN HFA) 108 (90 Base) MCG/ACT inhaler Inhale 2 puffs into the lungs every 6 (six) hours as needed. 18 g 5   diazepam (VALIUM) 2 MG tablet Take 1 tablet (2 mg total) by mouth every 8 (eight) hours as needed for anxiety. 90 tablet 5   ELIQUIS 2.5 MG TABS tablet TAKE 1 TABLET BY MOUTH TWICE A DAY 180 tablet 1   levothyroxine (SYNTHROID) 50 MCG tablet Take 1 tablet (50 mcg total) by mouth daily before breakfast. 90 tablet 1   MISC NATURAL PRODUCTS EX Apply 1 application topically in the morning and at bedtime. CBD cream for back and ankle pain     mometasone-formoterol (DULERA) 100-5 MCG/ACT AERO Inhale 2 puffs into the lungs 2 (two) times daily.     omeprazole (PRILOSEC) 40 MG capsule Take 1 capsule (40 mg total) by mouth daily. 30 capsule 2   polyethylene glycol (MIRALAX / GLYCOLAX) 17 g packet Take 17 g by mouth daily as needed for moderate constipation. 14 each 0   potassium chloride (KLOR-CON M) 10 MEQ tablet Take 1 tablet (10 mEq total) by mouth daily. 30 tablet 0   Propylene Glycol (SYSTANE COMPLETE OP) Apply 1 drop to eye as needed (dryness for right eye).     senna (SENOKOT) 8.6 MG TABS tablet Take 1 tablet (8.6 mg total) by mouth at bedtime. 120 tablet 0   torsemide (DEMADEX) 5  MG tablet Take 1 tablet (5 mg total) by mouth daily. 30 tablet 0   Wheat Dextrin (BENEFIBER PO) Take 1 Dose by mouth daily.     amLODipine (NORVASC) 5 MG tablet Take 1 tablet (5 mg total) by mouth daily. 30 tablet 0   No facility-administered medications prior to visit.    ROS Review of Systems  Constitutional:  Positive for fatigue. Negative for chills, diaphoresis and unexpected weight change.  HENT: Negative.  Negative for nosebleeds.   Eyes: Negative.   Respiratory:  Positive for shortness of breath. Negative for cough, chest tightness and wheezing.    Cardiovascular:  Positive for leg swelling. Negative for chest pain and palpitations.  Gastrointestinal: Negative.  Negative for abdominal pain, anal bleeding, blood in stool, diarrhea, nausea and vomiting.  Genitourinary: Negative.  Negative for difficulty urinating and dysuria.  Musculoskeletal: Negative.   Skin:  Positive for wound.  Neurological: Negative.  Negative for dizziness and weakness.  Hematological:  Negative for adenopathy. Bruises/bleeds easily.  Psychiatric/Behavioral: Negative.      Objective:  BP 136/72 (BP Location: Right Arm, Patient Position: Sitting, Cuff Size: Normal)   Pulse 83   Temp 97.9 F (36.6 C) (Oral)   Ht 5\' 6"  (1.676 m)   Wt 127 lb (57.6 kg)   SpO2 95%   BMI 20.50 kg/m   BP Readings from Last 3 Encounters:  10/19/22 136/72  10/15/22 (!) 140/58  10/13/22 128/82    Wt Readings from Last 3 Encounters:  10/19/22 127 lb (57.6 kg)  10/15/22 103 lb (46.7 kg)  09/30/22 103 lb (46.7 kg)    Physical Exam Vitals reviewed.  Constitutional:      Appearance: Normal appearance.  HENT:     Mouth/Throat:     Mouth: Mucous membranes are moist.  Eyes:     General: No scleral icterus.    Conjunctiva/sclera: Conjunctivae normal.  Cardiovascular:     Rate and Rhythm: Normal rate and regular rhythm. Occasional Extrasystoles are present.    Heart sounds: Murmur heard.     Systolic murmur is present with a grade of 1/6.     No gallop.  Pulmonary:     Effort: Pulmonary effort is normal.     Breath sounds: No stridor. No wheezing, rhonchi or rales.  Abdominal:     General: Abdomen is flat.     Palpations: There is no mass.     Tenderness: There is no abdominal tenderness. There is no guarding.     Hernia: No hernia is present.  Musculoskeletal:     Cervical back: Neck supple.     Right lower leg: 1+ Pitting Edema present.     Left lower leg: 1+ Pitting Edema present.  Neurological:     Mental Status: She is alert.     Lab Results  Component  Value Date   WBC 3.7 (L) 10/15/2022   HGB 11.4 (L) 10/15/2022   HCT 34.4 (L) 10/15/2022   PLT 240 10/15/2022   GLUCOSE 140 (H) 10/15/2022   CHOL 155 02/11/2014   TRIG 84.0 02/11/2014   HDL 46.10 02/11/2014   LDLCALC 92 02/11/2014   ALT 39 10/15/2022   AST 46 (H) 10/15/2022   NA 135 10/15/2022   K 4.1 10/15/2022   CL 98 10/15/2022   CREATININE 1.06 (H) 10/15/2022   BUN 22 10/15/2022   CO2 24 10/15/2022   TSH 6.07 (H) 09/09/2022   INR 1.3 (A) 03/17/2020   HGBA1C 5.7 06/12/2019    VAS [place]  LOWER EXTREMITY VENOUS (DVT) (ONLY [facility] & [facility])  Result Date: 10/17/2022  Lower Venous DVT Study Patient Name:  CATOYA FEMRITE  Date of Exam:   10/15/2022 Medical Rec #: [id number]          Accession #:    [id number] Date of Birth: [birth year]          Patient Gender: F Patient Age:   55 years Exam Location:  [facility] Procedure:      VAS [place] LOWER EXTREMITY VENOUS (DVT) Referring Phys: [facility] --------------------------------------------------------------------------------  Indications: Swelling.  Risk Factors: Past pregnancy. Comparison Study: No prior study Performing Technologist: Shona Simpson  Examination Guidelines: A complete evaluation includes B-mode imaging, spectral Doppler, color Doppler, and power Doppler as needed of all accessible portions of each vessel. Bilateral testing is considered an integral part of a complete examination. Limited examinations for reoccurring indications may be performed as noted. The reflux portion of the exam is performed with the patient in reverse Trendelenburg.  +---------+---------------+---------+-----------+----------+--------------+ RIGHT    CompressibilityPhasicitySpontaneityPropertiesThrombus Aging +---------+---------------+---------+-----------+----------+--------------+ CFV      Full           Yes      Yes                                 +---------+---------------+---------+-----------+----------+--------------+ SFJ      Full                                                         +---------+---------------+---------+-----------+----------+--------------+ FV Prox  Full                                                        +---------+---------------+---------+-----------+----------+--------------+ FV Mid   Full                                                        +---------+---------------+---------+-----------+----------+--------------+ FV DistalFull                                                        +---------+---------------+---------+-----------+----------+--------------+ PFV      Full                                                        +---------+---------------+---------+-----------+----------+--------------+ POP      Full           Yes      Yes                                 +---------+---------------+---------+-----------+----------+--------------+  PTV      Full                                                        +---------+---------------+---------+-----------+----------+--------------+ PERO     Full                                                        +---------+---------------+---------+-----------+----------+--------------+   +---------+---------------+---------+-----------+----------+--------------+ LEFT     CompressibilityPhasicitySpontaneityPropertiesThrombus Aging +---------+---------------+---------+-----------+----------+--------------+ CFV      Full           Yes      Yes                                 +---------+---------------+---------+-----------+----------+--------------+ SFJ      Full                                                        +---------+---------------+---------+-----------+----------+--------------+ FV Prox  Full                                                        +---------+---------------+---------+-----------+----------+--------------+ FV Mid   Full                                                         +---------+---------------+---------+-----------+----------+--------------+ FV DistalFull                                                        +---------+---------------+---------+-----------+----------+--------------+ PFV      Full                                                        +---------+---------------+---------+-----------+----------+--------------+ POP      Full           Yes      Yes                                 +---------+---------------+---------+-----------+----------+--------------+ PTV      Full                                                        +---------+---------------+---------+-----------+----------+--------------+  PERO     Full                                                        +---------+---------------+---------+-----------+----------+--------------+     Summary: BILATERAL: - No evidence of deep vein thrombosis seen in the lower extremities, bilaterally. -No evidence of popliteal cyst, bilaterally.   *See table(s) above for measurements and observations. Electronically signed by Heath Lark on 10/17/2022 at 11:01:22 AM.    Final    DG Tibia/Fibula Left  Result Date: 10/15/2022 CLINICAL DATA:  Bilateral leg swelling for 8 days, left-greater-than-right EXAM: LEFT TIBIA AND FIBULA - 2 VIEW; RIGHT TIBIA AND FIBULA - 2 VIEW COMPARISON:  None Available. FINDINGS: Osteopenia. No fracture or dislocation bilaterally. Soft tissue swelling distally about the lower legs and ankles. Note is made of chondrocalcinosis of the knee joint at the edge of the imaging field bilaterally. Some degenerative changes of the knees. Scattered vascular calcifications. IMPRESSION: Soft tissue swelling.  Chronic changes. Electronically Signed   By: Karen Kays M.D.   On: 10/15/2022 15:49   DG Tibia/Fibula Right  Result Date: 10/15/2022 CLINICAL DATA:  Bilateral leg swelling for 8 days, left-greater-than-right EXAM: LEFT TIBIA AND FIBULA - 2 VIEW; RIGHT TIBIA AND  FIBULA - 2 VIEW COMPARISON:  None Available. FINDINGS: Osteopenia. No fracture or dislocation bilaterally. Soft tissue swelling distally about the lower legs and ankles. Note is made of chondrocalcinosis of the knee joint at the edge of the imaging field bilaterally. Some degenerative changes of the knees. Scattered vascular calcifications. IMPRESSION: Soft tissue swelling.  Chronic changes. Electronically Signed   By: Karen Kays M.D.   On: 10/15/2022 15:49    Assessment & Plan:   Post-traumatic wound infection - The clx is negative for infection. Her wound was dressed. -     WOUND CULTURE; Future  Essential hypertension, benign - Her BP is well controlled. -     amLODIPine Besylate; Take 1 tablet (5 mg total) by mouth daily.  Dispense: 90 tablet; Refill: 0     Follow-up: No follow-ups on file.  Sanda Linger, MD

## 2022-10-20 DIAGNOSIS — I341 Nonrheumatic mitral (valve) prolapse: Secondary | ICD-10-CM | POA: Diagnosis not present

## 2022-10-20 DIAGNOSIS — I4821 Permanent atrial fibrillation: Secondary | ICD-10-CM | POA: Diagnosis not present

## 2022-10-20 DIAGNOSIS — K4090 Unilateral inguinal hernia, without obstruction or gangrene, not specified as recurrent: Secondary | ICD-10-CM | POA: Diagnosis not present

## 2022-10-20 DIAGNOSIS — Z87891 Personal history of nicotine dependence: Secondary | ICD-10-CM | POA: Diagnosis not present

## 2022-10-20 DIAGNOSIS — K589 Irritable bowel syndrome without diarrhea: Secondary | ICD-10-CM | POA: Diagnosis not present

## 2022-10-20 DIAGNOSIS — I890 Lymphedema, not elsewhere classified: Secondary | ICD-10-CM | POA: Diagnosis not present

## 2022-10-20 DIAGNOSIS — N1832 Chronic kidney disease, stage 3b: Secondary | ICD-10-CM | POA: Diagnosis not present

## 2022-10-20 DIAGNOSIS — J4520 Mild intermittent asthma, uncomplicated: Secondary | ICD-10-CM | POA: Diagnosis not present

## 2022-10-20 DIAGNOSIS — E871 Hypo-osmolality and hyponatremia: Secondary | ICD-10-CM | POA: Diagnosis not present

## 2022-10-20 DIAGNOSIS — K219 Gastro-esophageal reflux disease without esophagitis: Secondary | ICD-10-CM | POA: Diagnosis not present

## 2022-10-20 DIAGNOSIS — L03116 Cellulitis of left lower limb: Secondary | ICD-10-CM | POA: Diagnosis not present

## 2022-10-20 DIAGNOSIS — M810 Age-related osteoporosis without current pathological fracture: Secondary | ICD-10-CM | POA: Diagnosis not present

## 2022-10-20 DIAGNOSIS — K5790 Diverticulosis of intestine, part unspecified, without perforation or abscess without bleeding: Secondary | ICD-10-CM | POA: Diagnosis not present

## 2022-10-20 DIAGNOSIS — E039 Hypothyroidism, unspecified: Secondary | ICD-10-CM | POA: Diagnosis not present

## 2022-10-20 DIAGNOSIS — I129 Hypertensive chronic kidney disease with stage 1 through stage 4 chronic kidney disease, or unspecified chronic kidney disease: Secondary | ICD-10-CM | POA: Diagnosis not present

## 2022-10-20 DIAGNOSIS — F419 Anxiety disorder, unspecified: Secondary | ICD-10-CM | POA: Diagnosis not present

## 2022-10-21 ENCOUNTER — Encounter: Payer: Self-pay | Admitting: Internal Medicine

## 2022-10-21 ENCOUNTER — Other Ambulatory Visit: Payer: Self-pay | Admitting: Internal Medicine

## 2022-10-21 ENCOUNTER — Telehealth: Payer: Self-pay | Admitting: Internal Medicine

## 2022-10-21 DIAGNOSIS — L03116 Cellulitis of left lower limb: Secondary | ICD-10-CM | POA: Diagnosis not present

## 2022-10-21 DIAGNOSIS — I129 Hypertensive chronic kidney disease with stage 1 through stage 4 chronic kidney disease, or unspecified chronic kidney disease: Secondary | ICD-10-CM | POA: Diagnosis not present

## 2022-10-21 DIAGNOSIS — K589 Irritable bowel syndrome without diarrhea: Secondary | ICD-10-CM | POA: Diagnosis not present

## 2022-10-21 DIAGNOSIS — J4520 Mild intermittent asthma, uncomplicated: Secondary | ICD-10-CM | POA: Diagnosis not present

## 2022-10-21 DIAGNOSIS — K4090 Unilateral inguinal hernia, without obstruction or gangrene, not specified as recurrent: Secondary | ICD-10-CM | POA: Diagnosis not present

## 2022-10-21 DIAGNOSIS — N1832 Chronic kidney disease, stage 3b: Secondary | ICD-10-CM | POA: Diagnosis not present

## 2022-10-21 DIAGNOSIS — Z87891 Personal history of nicotine dependence: Secondary | ICD-10-CM | POA: Diagnosis not present

## 2022-10-21 DIAGNOSIS — I890 Lymphedema, not elsewhere classified: Secondary | ICD-10-CM | POA: Diagnosis not present

## 2022-10-21 DIAGNOSIS — M810 Age-related osteoporosis without current pathological fracture: Secondary | ICD-10-CM | POA: Diagnosis not present

## 2022-10-21 DIAGNOSIS — E871 Hypo-osmolality and hyponatremia: Secondary | ICD-10-CM | POA: Diagnosis not present

## 2022-10-21 DIAGNOSIS — E039 Hypothyroidism, unspecified: Secondary | ICD-10-CM | POA: Diagnosis not present

## 2022-10-21 DIAGNOSIS — M7989 Other specified soft tissue disorders: Secondary | ICD-10-CM

## 2022-10-21 DIAGNOSIS — I4821 Permanent atrial fibrillation: Secondary | ICD-10-CM | POA: Diagnosis not present

## 2022-10-21 DIAGNOSIS — K5790 Diverticulosis of intestine, part unspecified, without perforation or abscess without bleeding: Secondary | ICD-10-CM | POA: Diagnosis not present

## 2022-10-21 DIAGNOSIS — K219 Gastro-esophageal reflux disease without esophagitis: Secondary | ICD-10-CM | POA: Diagnosis not present

## 2022-10-21 DIAGNOSIS — I341 Nonrheumatic mitral (valve) prolapse: Secondary | ICD-10-CM | POA: Diagnosis not present

## 2022-10-21 DIAGNOSIS — F419 Anxiety disorder, unspecified: Secondary | ICD-10-CM | POA: Diagnosis not present

## 2022-10-21 MED ORDER — AMLODIPINE BESYLATE 5 MG PO TABS
5.0000 mg | ORAL_TABLET | Freq: Every day | ORAL | 0 refills | Status: AC
Start: 2022-10-21 — End: 2022-11-20

## 2022-10-21 NOTE — Telephone Encounter (Signed)
Verbal orders given for skilled nursing

## 2022-10-21 NOTE — Telephone Encounter (Signed)
Alyssa [name] with [facility] called requesting verbal orders for skilled nursing 1 week 1 and 2 week 1 for bilateral lower external wounds. Best callback is [phone].

## 2022-10-22 DIAGNOSIS — I341 Nonrheumatic mitral (valve) prolapse: Secondary | ICD-10-CM | POA: Diagnosis not present

## 2022-10-22 DIAGNOSIS — I4821 Permanent atrial fibrillation: Secondary | ICD-10-CM | POA: Diagnosis not present

## 2022-10-22 DIAGNOSIS — K5790 Diverticulosis of intestine, part unspecified, without perforation or abscess without bleeding: Secondary | ICD-10-CM | POA: Diagnosis not present

## 2022-10-22 DIAGNOSIS — E039 Hypothyroidism, unspecified: Secondary | ICD-10-CM | POA: Diagnosis not present

## 2022-10-22 DIAGNOSIS — I129 Hypertensive chronic kidney disease with stage 1 through stage 4 chronic kidney disease, or unspecified chronic kidney disease: Secondary | ICD-10-CM | POA: Diagnosis not present

## 2022-10-22 DIAGNOSIS — J4520 Mild intermittent asthma, uncomplicated: Secondary | ICD-10-CM | POA: Diagnosis not present

## 2022-10-22 DIAGNOSIS — L03116 Cellulitis of left lower limb: Secondary | ICD-10-CM | POA: Diagnosis not present

## 2022-10-22 DIAGNOSIS — K219 Gastro-esophageal reflux disease without esophagitis: Secondary | ICD-10-CM | POA: Diagnosis not present

## 2022-10-22 DIAGNOSIS — M810 Age-related osteoporosis without current pathological fracture: Secondary | ICD-10-CM | POA: Diagnosis not present

## 2022-10-22 DIAGNOSIS — K4090 Unilateral inguinal hernia, without obstruction or gangrene, not specified as recurrent: Secondary | ICD-10-CM | POA: Diagnosis not present

## 2022-10-22 DIAGNOSIS — I890 Lymphedema, not elsewhere classified: Secondary | ICD-10-CM | POA: Diagnosis not present

## 2022-10-22 DIAGNOSIS — F419 Anxiety disorder, unspecified: Secondary | ICD-10-CM | POA: Diagnosis not present

## 2022-10-22 DIAGNOSIS — K589 Irritable bowel syndrome without diarrhea: Secondary | ICD-10-CM | POA: Diagnosis not present

## 2022-10-22 DIAGNOSIS — E871 Hypo-osmolality and hyponatremia: Secondary | ICD-10-CM | POA: Diagnosis not present

## 2022-10-22 DIAGNOSIS — Z87891 Personal history of nicotine dependence: Secondary | ICD-10-CM | POA: Diagnosis not present

## 2022-10-22 DIAGNOSIS — N1832 Chronic kidney disease, stage 3b: Secondary | ICD-10-CM | POA: Diagnosis not present

## 2022-10-22 LAB — WOUND CULTURE

## 2022-10-24 ENCOUNTER — Encounter: Payer: Self-pay | Admitting: Internal Medicine

## 2022-10-25 ENCOUNTER — Other Ambulatory Visit: Payer: Self-pay | Admitting: Internal Medicine

## 2022-10-25 DIAGNOSIS — I5033 Acute on chronic diastolic (congestive) heart failure: Secondary | ICD-10-CM

## 2022-10-25 DIAGNOSIS — L089 Local infection of the skin and subcutaneous tissue, unspecified: Secondary | ICD-10-CM

## 2022-10-25 DIAGNOSIS — E871 Hypo-osmolality and hyponatremia: Secondary | ICD-10-CM | POA: Diagnosis not present

## 2022-10-25 DIAGNOSIS — E039 Hypothyroidism, unspecified: Secondary | ICD-10-CM | POA: Diagnosis not present

## 2022-10-25 DIAGNOSIS — I129 Hypertensive chronic kidney disease with stage 1 through stage 4 chronic kidney disease, or unspecified chronic kidney disease: Secondary | ICD-10-CM | POA: Diagnosis not present

## 2022-10-25 DIAGNOSIS — K219 Gastro-esophageal reflux disease without esophagitis: Secondary | ICD-10-CM | POA: Diagnosis not present

## 2022-10-25 DIAGNOSIS — M810 Age-related osteoporosis without current pathological fracture: Secondary | ICD-10-CM | POA: Diagnosis not present

## 2022-10-25 DIAGNOSIS — F419 Anxiety disorder, unspecified: Secondary | ICD-10-CM | POA: Diagnosis not present

## 2022-10-25 DIAGNOSIS — J4520 Mild intermittent asthma, uncomplicated: Secondary | ICD-10-CM | POA: Diagnosis not present

## 2022-10-25 DIAGNOSIS — K589 Irritable bowel syndrome without diarrhea: Secondary | ICD-10-CM | POA: Diagnosis not present

## 2022-10-25 DIAGNOSIS — K5790 Diverticulosis of intestine, part unspecified, without perforation or abscess without bleeding: Secondary | ICD-10-CM | POA: Diagnosis not present

## 2022-10-25 DIAGNOSIS — Z87891 Personal history of nicotine dependence: Secondary | ICD-10-CM | POA: Diagnosis not present

## 2022-10-25 DIAGNOSIS — I4821 Permanent atrial fibrillation: Secondary | ICD-10-CM | POA: Diagnosis not present

## 2022-10-25 DIAGNOSIS — L03116 Cellulitis of left lower limb: Secondary | ICD-10-CM | POA: Diagnosis not present

## 2022-10-25 DIAGNOSIS — I890 Lymphedema, not elsewhere classified: Secondary | ICD-10-CM | POA: Diagnosis not present

## 2022-10-25 DIAGNOSIS — I341 Nonrheumatic mitral (valve) prolapse: Secondary | ICD-10-CM | POA: Diagnosis not present

## 2022-10-25 DIAGNOSIS — N1832 Chronic kidney disease, stage 3b: Secondary | ICD-10-CM | POA: Diagnosis not present

## 2022-10-25 DIAGNOSIS — K4090 Unilateral inguinal hernia, without obstruction or gangrene, not specified as recurrent: Secondary | ICD-10-CM | POA: Diagnosis not present

## 2022-10-26 DIAGNOSIS — I341 Nonrheumatic mitral (valve) prolapse: Secondary | ICD-10-CM | POA: Diagnosis not present

## 2022-10-26 DIAGNOSIS — Z87891 Personal history of nicotine dependence: Secondary | ICD-10-CM | POA: Diagnosis not present

## 2022-10-26 DIAGNOSIS — M810 Age-related osteoporosis without current pathological fracture: Secondary | ICD-10-CM | POA: Diagnosis not present

## 2022-10-26 DIAGNOSIS — L03116 Cellulitis of left lower limb: Secondary | ICD-10-CM | POA: Diagnosis not present

## 2022-10-26 DIAGNOSIS — F419 Anxiety disorder, unspecified: Secondary | ICD-10-CM | POA: Diagnosis not present

## 2022-10-26 DIAGNOSIS — J4520 Mild intermittent asthma, uncomplicated: Secondary | ICD-10-CM | POA: Diagnosis not present

## 2022-10-26 DIAGNOSIS — E871 Hypo-osmolality and hyponatremia: Secondary | ICD-10-CM | POA: Diagnosis not present

## 2022-10-26 DIAGNOSIS — E039 Hypothyroidism, unspecified: Secondary | ICD-10-CM | POA: Diagnosis not present

## 2022-10-26 DIAGNOSIS — K4090 Unilateral inguinal hernia, without obstruction or gangrene, not specified as recurrent: Secondary | ICD-10-CM | POA: Diagnosis not present

## 2022-10-26 DIAGNOSIS — N1832 Chronic kidney disease, stage 3b: Secondary | ICD-10-CM | POA: Diagnosis not present

## 2022-10-26 DIAGNOSIS — K5790 Diverticulosis of intestine, part unspecified, without perforation or abscess without bleeding: Secondary | ICD-10-CM | POA: Diagnosis not present

## 2022-10-26 DIAGNOSIS — K589 Irritable bowel syndrome without diarrhea: Secondary | ICD-10-CM | POA: Diagnosis not present

## 2022-10-26 DIAGNOSIS — K219 Gastro-esophageal reflux disease without esophagitis: Secondary | ICD-10-CM | POA: Diagnosis not present

## 2022-10-26 DIAGNOSIS — I4821 Permanent atrial fibrillation: Secondary | ICD-10-CM | POA: Diagnosis not present

## 2022-10-26 DIAGNOSIS — I129 Hypertensive chronic kidney disease with stage 1 through stage 4 chronic kidney disease, or unspecified chronic kidney disease: Secondary | ICD-10-CM | POA: Diagnosis not present

## 2022-10-26 DIAGNOSIS — I890 Lymphedema, not elsewhere classified: Secondary | ICD-10-CM | POA: Diagnosis not present

## 2022-10-27 DIAGNOSIS — K589 Irritable bowel syndrome without diarrhea: Secondary | ICD-10-CM | POA: Diagnosis not present

## 2022-10-27 DIAGNOSIS — E039 Hypothyroidism, unspecified: Secondary | ICD-10-CM | POA: Diagnosis not present

## 2022-10-27 DIAGNOSIS — I890 Lymphedema, not elsewhere classified: Secondary | ICD-10-CM | POA: Diagnosis not present

## 2022-10-27 DIAGNOSIS — E871 Hypo-osmolality and hyponatremia: Secondary | ICD-10-CM | POA: Diagnosis not present

## 2022-10-27 DIAGNOSIS — F419 Anxiety disorder, unspecified: Secondary | ICD-10-CM | POA: Diagnosis not present

## 2022-10-27 DIAGNOSIS — M810 Age-related osteoporosis without current pathological fracture: Secondary | ICD-10-CM | POA: Diagnosis not present

## 2022-10-27 DIAGNOSIS — N1832 Chronic kidney disease, stage 3b: Secondary | ICD-10-CM | POA: Diagnosis not present

## 2022-10-27 DIAGNOSIS — J4520 Mild intermittent asthma, uncomplicated: Secondary | ICD-10-CM | POA: Diagnosis not present

## 2022-10-27 DIAGNOSIS — I341 Nonrheumatic mitral (valve) prolapse: Secondary | ICD-10-CM | POA: Diagnosis not present

## 2022-10-27 DIAGNOSIS — I129 Hypertensive chronic kidney disease with stage 1 through stage 4 chronic kidney disease, or unspecified chronic kidney disease: Secondary | ICD-10-CM | POA: Diagnosis not present

## 2022-10-27 DIAGNOSIS — K4090 Unilateral inguinal hernia, without obstruction or gangrene, not specified as recurrent: Secondary | ICD-10-CM | POA: Diagnosis not present

## 2022-10-27 DIAGNOSIS — I4821 Permanent atrial fibrillation: Secondary | ICD-10-CM | POA: Diagnosis not present

## 2022-10-27 DIAGNOSIS — L03116 Cellulitis of left lower limb: Secondary | ICD-10-CM | POA: Diagnosis not present

## 2022-10-27 DIAGNOSIS — K219 Gastro-esophageal reflux disease without esophagitis: Secondary | ICD-10-CM | POA: Diagnosis not present

## 2022-10-27 DIAGNOSIS — Z87891 Personal history of nicotine dependence: Secondary | ICD-10-CM | POA: Diagnosis not present

## 2022-10-27 DIAGNOSIS — K5790 Diverticulosis of intestine, part unspecified, without perforation or abscess without bleeding: Secondary | ICD-10-CM | POA: Diagnosis not present

## 2022-11-03 DIAGNOSIS — M7989 Other specified soft tissue disorders: Secondary | ICD-10-CM | POA: Diagnosis not present

## 2022-11-04 DIAGNOSIS — E039 Hypothyroidism, unspecified: Secondary | ICD-10-CM | POA: Diagnosis not present

## 2022-11-04 DIAGNOSIS — I5030 Unspecified diastolic (congestive) heart failure: Secondary | ICD-10-CM | POA: Diagnosis not present

## 2022-11-04 DIAGNOSIS — N184 Chronic kidney disease, stage 4 (severe): Secondary | ICD-10-CM | POA: Diagnosis not present

## 2022-11-04 DIAGNOSIS — T148XXA Other injury of unspecified body region, initial encounter: Secondary | ICD-10-CM | POA: Diagnosis not present

## 2022-11-04 DIAGNOSIS — I4821 Permanent atrial fibrillation: Secondary | ICD-10-CM | POA: Diagnosis not present

## 2022-11-04 DIAGNOSIS — Z1330 Encounter for screening examination for mental health and behavioral disorders, unspecified: Secondary | ICD-10-CM | POA: Diagnosis not present

## 2022-11-05 DIAGNOSIS — H9190 Unspecified hearing loss, unspecified ear: Secondary | ICD-10-CM | POA: Diagnosis not present

## 2022-11-05 DIAGNOSIS — L089 Local infection of the skin and subcutaneous tissue, unspecified: Secondary | ICD-10-CM | POA: Diagnosis not present

## 2022-11-05 DIAGNOSIS — I4821 Permanent atrial fibrillation: Secondary | ICD-10-CM | POA: Diagnosis not present

## 2022-11-05 DIAGNOSIS — I83028 Varicose veins of left lower extremity with ulcer other part of lower leg: Secondary | ICD-10-CM | POA: Diagnosis not present

## 2022-11-05 DIAGNOSIS — D631 Anemia in chronic kidney disease: Secondary | ICD-10-CM | POA: Diagnosis not present

## 2022-11-05 DIAGNOSIS — F411 Generalized anxiety disorder: Secondary | ICD-10-CM | POA: Diagnosis not present

## 2022-11-05 DIAGNOSIS — I5032 Chronic diastolic (congestive) heart failure: Secondary | ICD-10-CM | POA: Diagnosis not present

## 2022-11-05 DIAGNOSIS — L97821 Non-pressure chronic ulcer of other part of left lower leg limited to breakdown of skin: Secondary | ICD-10-CM | POA: Diagnosis not present

## 2022-11-05 DIAGNOSIS — M810 Age-related osteoporosis without current pathological fracture: Secondary | ICD-10-CM | POA: Diagnosis not present

## 2022-11-05 DIAGNOSIS — Z7409 Other reduced mobility: Secondary | ICD-10-CM | POA: Diagnosis not present

## 2022-11-05 DIAGNOSIS — I071 Rheumatic tricuspid insufficiency: Secondary | ICD-10-CM | POA: Diagnosis not present

## 2022-11-05 DIAGNOSIS — Z950 Presence of cardiac pacemaker: Secondary | ICD-10-CM | POA: Diagnosis not present

## 2022-11-05 DIAGNOSIS — E039 Hypothyroidism, unspecified: Secondary | ICD-10-CM | POA: Diagnosis not present

## 2022-11-05 DIAGNOSIS — Z4800 Encounter for change or removal of nonsurgical wound dressing: Secondary | ICD-10-CM | POA: Diagnosis not present

## 2022-11-05 DIAGNOSIS — A000 Cholera due to Vibrio cholerae 01, biovar cholerae: Secondary | ICD-10-CM | POA: Diagnosis not present

## 2022-11-05 DIAGNOSIS — I130 Hypertensive heart and chronic kidney disease with heart failure and stage 1 through stage 4 chronic kidney disease, or unspecified chronic kidney disease: Secondary | ICD-10-CM | POA: Diagnosis not present

## 2022-11-05 DIAGNOSIS — N184 Chronic kidney disease, stage 4 (severe): Secondary | ICD-10-CM | POA: Diagnosis not present

## 2022-11-07 DIAGNOSIS — I071 Rheumatic tricuspid insufficiency: Secondary | ICD-10-CM | POA: Diagnosis not present

## 2022-11-07 DIAGNOSIS — N184 Chronic kidney disease, stage 4 (severe): Secondary | ICD-10-CM | POA: Diagnosis not present

## 2022-11-07 DIAGNOSIS — L089 Local infection of the skin and subcutaneous tissue, unspecified: Secondary | ICD-10-CM | POA: Diagnosis not present

## 2022-11-07 DIAGNOSIS — M810 Age-related osteoporosis without current pathological fracture: Secondary | ICD-10-CM | POA: Diagnosis not present

## 2022-11-07 DIAGNOSIS — D631 Anemia in chronic kidney disease: Secondary | ICD-10-CM | POA: Diagnosis not present

## 2022-11-07 DIAGNOSIS — F411 Generalized anxiety disorder: Secondary | ICD-10-CM | POA: Diagnosis not present

## 2022-11-07 DIAGNOSIS — E039 Hypothyroidism, unspecified: Secondary | ICD-10-CM | POA: Diagnosis not present

## 2022-11-07 DIAGNOSIS — L97821 Non-pressure chronic ulcer of other part of left lower leg limited to breakdown of skin: Secondary | ICD-10-CM | POA: Diagnosis not present

## 2022-11-07 DIAGNOSIS — Z950 Presence of cardiac pacemaker: Secondary | ICD-10-CM | POA: Diagnosis not present

## 2022-11-07 DIAGNOSIS — I83028 Varicose veins of left lower extremity with ulcer other part of lower leg: Secondary | ICD-10-CM | POA: Diagnosis not present

## 2022-11-07 DIAGNOSIS — Z7409 Other reduced mobility: Secondary | ICD-10-CM | POA: Diagnosis not present

## 2022-11-07 DIAGNOSIS — I5032 Chronic diastolic (congestive) heart failure: Secondary | ICD-10-CM | POA: Diagnosis not present

## 2022-11-07 DIAGNOSIS — I130 Hypertensive heart and chronic kidney disease with heart failure and stage 1 through stage 4 chronic kidney disease, or unspecified chronic kidney disease: Secondary | ICD-10-CM | POA: Diagnosis not present

## 2022-11-07 DIAGNOSIS — Z4800 Encounter for change or removal of nonsurgical wound dressing: Secondary | ICD-10-CM | POA: Diagnosis not present

## 2022-11-07 DIAGNOSIS — I4821 Permanent atrial fibrillation: Secondary | ICD-10-CM | POA: Diagnosis not present

## 2022-11-07 DIAGNOSIS — H9190 Unspecified hearing loss, unspecified ear: Secondary | ICD-10-CM | POA: Diagnosis not present

## 2022-11-08 DIAGNOSIS — H9190 Unspecified hearing loss, unspecified ear: Secondary | ICD-10-CM | POA: Diagnosis not present

## 2022-11-08 DIAGNOSIS — Z7409 Other reduced mobility: Secondary | ICD-10-CM | POA: Diagnosis not present

## 2022-11-08 DIAGNOSIS — Z4800 Encounter for change or removal of nonsurgical wound dressing: Secondary | ICD-10-CM | POA: Diagnosis not present

## 2022-11-08 DIAGNOSIS — I5032 Chronic diastolic (congestive) heart failure: Secondary | ICD-10-CM | POA: Diagnosis not present

## 2022-11-08 DIAGNOSIS — E039 Hypothyroidism, unspecified: Secondary | ICD-10-CM | POA: Diagnosis not present

## 2022-11-08 DIAGNOSIS — I071 Rheumatic tricuspid insufficiency: Secondary | ICD-10-CM | POA: Diagnosis not present

## 2022-11-08 DIAGNOSIS — I83028 Varicose veins of left lower extremity with ulcer other part of lower leg: Secondary | ICD-10-CM | POA: Diagnosis not present

## 2022-11-08 DIAGNOSIS — F411 Generalized anxiety disorder: Secondary | ICD-10-CM | POA: Diagnosis not present

## 2022-11-08 DIAGNOSIS — L97821 Non-pressure chronic ulcer of other part of left lower leg limited to breakdown of skin: Secondary | ICD-10-CM | POA: Diagnosis not present

## 2022-11-08 DIAGNOSIS — L089 Local infection of the skin and subcutaneous tissue, unspecified: Secondary | ICD-10-CM | POA: Diagnosis not present

## 2022-11-08 DIAGNOSIS — I130 Hypertensive heart and chronic kidney disease with heart failure and stage 1 through stage 4 chronic kidney disease, or unspecified chronic kidney disease: Secondary | ICD-10-CM | POA: Diagnosis not present

## 2022-11-08 DIAGNOSIS — N184 Chronic kidney disease, stage 4 (severe): Secondary | ICD-10-CM | POA: Diagnosis not present

## 2022-11-08 DIAGNOSIS — Z950 Presence of cardiac pacemaker: Secondary | ICD-10-CM | POA: Diagnosis not present

## 2022-11-08 DIAGNOSIS — D631 Anemia in chronic kidney disease: Secondary | ICD-10-CM | POA: Diagnosis not present

## 2022-11-08 DIAGNOSIS — I4821 Permanent atrial fibrillation: Secondary | ICD-10-CM | POA: Diagnosis not present

## 2022-11-08 DIAGNOSIS — M810 Age-related osteoporosis without current pathological fracture: Secondary | ICD-10-CM | POA: Diagnosis not present

## 2022-11-10 DIAGNOSIS — Z950 Presence of cardiac pacemaker: Secondary | ICD-10-CM | POA: Diagnosis not present

## 2022-11-10 DIAGNOSIS — H9190 Unspecified hearing loss, unspecified ear: Secondary | ICD-10-CM | POA: Diagnosis not present

## 2022-11-10 DIAGNOSIS — Z4800 Encounter for change or removal of nonsurgical wound dressing: Secondary | ICD-10-CM | POA: Diagnosis not present

## 2022-11-10 DIAGNOSIS — Z7409 Other reduced mobility: Secondary | ICD-10-CM | POA: Diagnosis not present

## 2022-11-10 DIAGNOSIS — N184 Chronic kidney disease, stage 4 (severe): Secondary | ICD-10-CM | POA: Diagnosis not present

## 2022-11-10 DIAGNOSIS — I5032 Chronic diastolic (congestive) heart failure: Secondary | ICD-10-CM | POA: Diagnosis not present

## 2022-11-10 DIAGNOSIS — L97821 Non-pressure chronic ulcer of other part of left lower leg limited to breakdown of skin: Secondary | ICD-10-CM | POA: Diagnosis not present

## 2022-11-10 DIAGNOSIS — I4821 Permanent atrial fibrillation: Secondary | ICD-10-CM | POA: Diagnosis not present

## 2022-11-10 DIAGNOSIS — D631 Anemia in chronic kidney disease: Secondary | ICD-10-CM | POA: Diagnosis not present

## 2022-11-10 DIAGNOSIS — L089 Local infection of the skin and subcutaneous tissue, unspecified: Secondary | ICD-10-CM | POA: Diagnosis not present

## 2022-11-10 DIAGNOSIS — E039 Hypothyroidism, unspecified: Secondary | ICD-10-CM | POA: Diagnosis not present

## 2022-11-10 DIAGNOSIS — I83028 Varicose veins of left lower extremity with ulcer other part of lower leg: Secondary | ICD-10-CM | POA: Diagnosis not present

## 2022-11-10 DIAGNOSIS — I130 Hypertensive heart and chronic kidney disease with heart failure and stage 1 through stage 4 chronic kidney disease, or unspecified chronic kidney disease: Secondary | ICD-10-CM | POA: Diagnosis not present

## 2022-11-10 DIAGNOSIS — I071 Rheumatic tricuspid insufficiency: Secondary | ICD-10-CM | POA: Diagnosis not present

## 2022-11-10 DIAGNOSIS — M810 Age-related osteoporosis without current pathological fracture: Secondary | ICD-10-CM | POA: Diagnosis not present

## 2022-11-10 DIAGNOSIS — F411 Generalized anxiety disorder: Secondary | ICD-10-CM | POA: Diagnosis not present

## 2022-11-12 DIAGNOSIS — I071 Rheumatic tricuspid insufficiency: Secondary | ICD-10-CM | POA: Diagnosis not present

## 2022-11-12 DIAGNOSIS — D631 Anemia in chronic kidney disease: Secondary | ICD-10-CM | POA: Diagnosis not present

## 2022-11-12 DIAGNOSIS — Z950 Presence of cardiac pacemaker: Secondary | ICD-10-CM | POA: Diagnosis not present

## 2022-11-12 DIAGNOSIS — M810 Age-related osteoporosis without current pathological fracture: Secondary | ICD-10-CM | POA: Diagnosis not present

## 2022-11-12 DIAGNOSIS — Z4800 Encounter for change or removal of nonsurgical wound dressing: Secondary | ICD-10-CM | POA: Diagnosis not present

## 2022-11-12 DIAGNOSIS — F411 Generalized anxiety disorder: Secondary | ICD-10-CM | POA: Diagnosis not present

## 2022-11-12 DIAGNOSIS — I130 Hypertensive heart and chronic kidney disease with heart failure and stage 1 through stage 4 chronic kidney disease, or unspecified chronic kidney disease: Secondary | ICD-10-CM | POA: Diagnosis not present

## 2022-11-12 DIAGNOSIS — Z7409 Other reduced mobility: Secondary | ICD-10-CM | POA: Diagnosis not present

## 2022-11-12 DIAGNOSIS — L97821 Non-pressure chronic ulcer of other part of left lower leg limited to breakdown of skin: Secondary | ICD-10-CM | POA: Diagnosis not present

## 2022-11-12 DIAGNOSIS — I5032 Chronic diastolic (congestive) heart failure: Secondary | ICD-10-CM | POA: Diagnosis not present

## 2022-11-12 DIAGNOSIS — I4821 Permanent atrial fibrillation: Secondary | ICD-10-CM | POA: Diagnosis not present

## 2022-11-12 DIAGNOSIS — L089 Local infection of the skin and subcutaneous tissue, unspecified: Secondary | ICD-10-CM | POA: Diagnosis not present

## 2022-11-12 DIAGNOSIS — N184 Chronic kidney disease, stage 4 (severe): Secondary | ICD-10-CM | POA: Diagnosis not present

## 2022-11-12 DIAGNOSIS — H9190 Unspecified hearing loss, unspecified ear: Secondary | ICD-10-CM | POA: Diagnosis not present

## 2022-11-12 DIAGNOSIS — I83028 Varicose veins of left lower extremity with ulcer other part of lower leg: Secondary | ICD-10-CM | POA: Diagnosis not present

## 2022-11-12 DIAGNOSIS — E039 Hypothyroidism, unspecified: Secondary | ICD-10-CM | POA: Diagnosis not present

## 2022-11-14 DIAGNOSIS — L97221 Non-pressure chronic ulcer of left calf limited to breakdown of skin: Secondary | ICD-10-CM | POA: Diagnosis not present

## 2022-11-15 DIAGNOSIS — I5032 Chronic diastolic (congestive) heart failure: Secondary | ICD-10-CM | POA: Diagnosis not present

## 2022-11-15 DIAGNOSIS — Z7409 Other reduced mobility: Secondary | ICD-10-CM | POA: Diagnosis not present

## 2022-11-15 DIAGNOSIS — H9190 Unspecified hearing loss, unspecified ear: Secondary | ICD-10-CM | POA: Diagnosis not present

## 2022-11-15 DIAGNOSIS — L97821 Non-pressure chronic ulcer of other part of left lower leg limited to breakdown of skin: Secondary | ICD-10-CM | POA: Diagnosis not present

## 2022-11-15 DIAGNOSIS — E039 Hypothyroidism, unspecified: Secondary | ICD-10-CM | POA: Diagnosis not present

## 2022-11-15 DIAGNOSIS — F411 Generalized anxiety disorder: Secondary | ICD-10-CM | POA: Diagnosis not present

## 2022-11-15 DIAGNOSIS — D631 Anemia in chronic kidney disease: Secondary | ICD-10-CM | POA: Diagnosis not present

## 2022-11-15 DIAGNOSIS — I130 Hypertensive heart and chronic kidney disease with heart failure and stage 1 through stage 4 chronic kidney disease, or unspecified chronic kidney disease: Secondary | ICD-10-CM | POA: Diagnosis not present

## 2022-11-15 DIAGNOSIS — I071 Rheumatic tricuspid insufficiency: Secondary | ICD-10-CM | POA: Diagnosis not present

## 2022-11-15 DIAGNOSIS — N184 Chronic kidney disease, stage 4 (severe): Secondary | ICD-10-CM | POA: Diagnosis not present

## 2022-11-15 DIAGNOSIS — L089 Local infection of the skin and subcutaneous tissue, unspecified: Secondary | ICD-10-CM | POA: Diagnosis not present

## 2022-11-15 DIAGNOSIS — I83028 Varicose veins of left lower extremity with ulcer other part of lower leg: Secondary | ICD-10-CM | POA: Diagnosis not present

## 2022-11-15 DIAGNOSIS — M810 Age-related osteoporosis without current pathological fracture: Secondary | ICD-10-CM | POA: Diagnosis not present

## 2022-11-15 DIAGNOSIS — I4821 Permanent atrial fibrillation: Secondary | ICD-10-CM | POA: Diagnosis not present

## 2022-11-15 DIAGNOSIS — Z4800 Encounter for change or removal of nonsurgical wound dressing: Secondary | ICD-10-CM | POA: Diagnosis not present

## 2022-11-15 DIAGNOSIS — Z950 Presence of cardiac pacemaker: Secondary | ICD-10-CM | POA: Diagnosis not present

## 2022-11-17 DIAGNOSIS — N184 Chronic kidney disease, stage 4 (severe): Secondary | ICD-10-CM | POA: Diagnosis not present

## 2022-11-17 DIAGNOSIS — E039 Hypothyroidism, unspecified: Secondary | ICD-10-CM | POA: Diagnosis not present

## 2022-11-17 DIAGNOSIS — F411 Generalized anxiety disorder: Secondary | ICD-10-CM | POA: Diagnosis not present

## 2022-11-17 DIAGNOSIS — Z7409 Other reduced mobility: Secondary | ICD-10-CM | POA: Diagnosis not present

## 2022-11-17 DIAGNOSIS — L089 Local infection of the skin and subcutaneous tissue, unspecified: Secondary | ICD-10-CM | POA: Diagnosis not present

## 2022-11-17 DIAGNOSIS — I4821 Permanent atrial fibrillation: Secondary | ICD-10-CM | POA: Diagnosis not present

## 2022-11-17 DIAGNOSIS — I071 Rheumatic tricuspid insufficiency: Secondary | ICD-10-CM | POA: Diagnosis not present

## 2022-11-17 DIAGNOSIS — I5032 Chronic diastolic (congestive) heart failure: Secondary | ICD-10-CM | POA: Diagnosis not present

## 2022-11-17 DIAGNOSIS — L97821 Non-pressure chronic ulcer of other part of left lower leg limited to breakdown of skin: Secondary | ICD-10-CM | POA: Diagnosis not present

## 2022-11-17 DIAGNOSIS — Z950 Presence of cardiac pacemaker: Secondary | ICD-10-CM | POA: Diagnosis not present

## 2022-11-17 DIAGNOSIS — M810 Age-related osteoporosis without current pathological fracture: Secondary | ICD-10-CM | POA: Diagnosis not present

## 2022-11-17 DIAGNOSIS — I83028 Varicose veins of left lower extremity with ulcer other part of lower leg: Secondary | ICD-10-CM | POA: Diagnosis not present

## 2022-11-17 DIAGNOSIS — Z4800 Encounter for change or removal of nonsurgical wound dressing: Secondary | ICD-10-CM | POA: Diagnosis not present

## 2022-11-17 DIAGNOSIS — I130 Hypertensive heart and chronic kidney disease with heart failure and stage 1 through stage 4 chronic kidney disease, or unspecified chronic kidney disease: Secondary | ICD-10-CM | POA: Diagnosis not present

## 2022-11-17 DIAGNOSIS — D631 Anemia in chronic kidney disease: Secondary | ICD-10-CM | POA: Diagnosis not present

## 2022-11-17 DIAGNOSIS — H9190 Unspecified hearing loss, unspecified ear: Secondary | ICD-10-CM | POA: Diagnosis not present

## 2022-11-18 DIAGNOSIS — E039 Hypothyroidism, unspecified: Secondary | ICD-10-CM | POA: Diagnosis not present

## 2022-11-18 DIAGNOSIS — Z4800 Encounter for change or removal of nonsurgical wound dressing: Secondary | ICD-10-CM | POA: Diagnosis not present

## 2022-11-18 DIAGNOSIS — I130 Hypertensive heart and chronic kidney disease with heart failure and stage 1 through stage 4 chronic kidney disease, or unspecified chronic kidney disease: Secondary | ICD-10-CM | POA: Diagnosis not present

## 2022-11-18 DIAGNOSIS — Z950 Presence of cardiac pacemaker: Secondary | ICD-10-CM | POA: Diagnosis not present

## 2022-11-18 DIAGNOSIS — I5032 Chronic diastolic (congestive) heart failure: Secondary | ICD-10-CM | POA: Diagnosis not present

## 2022-11-18 DIAGNOSIS — L089 Local infection of the skin and subcutaneous tissue, unspecified: Secondary | ICD-10-CM | POA: Diagnosis not present

## 2022-11-18 DIAGNOSIS — F411 Generalized anxiety disorder: Secondary | ICD-10-CM | POA: Diagnosis not present

## 2022-11-18 DIAGNOSIS — I83028 Varicose veins of left lower extremity with ulcer other part of lower leg: Secondary | ICD-10-CM | POA: Diagnosis not present

## 2022-11-18 DIAGNOSIS — L97821 Non-pressure chronic ulcer of other part of left lower leg limited to breakdown of skin: Secondary | ICD-10-CM | POA: Diagnosis not present

## 2022-11-18 DIAGNOSIS — D631 Anemia in chronic kidney disease: Secondary | ICD-10-CM | POA: Diagnosis not present

## 2022-11-18 DIAGNOSIS — M810 Age-related osteoporosis without current pathological fracture: Secondary | ICD-10-CM | POA: Diagnosis not present

## 2022-11-18 DIAGNOSIS — I071 Rheumatic tricuspid insufficiency: Secondary | ICD-10-CM | POA: Diagnosis not present

## 2022-11-18 DIAGNOSIS — H9190 Unspecified hearing loss, unspecified ear: Secondary | ICD-10-CM | POA: Diagnosis not present

## 2022-11-18 DIAGNOSIS — N184 Chronic kidney disease, stage 4 (severe): Secondary | ICD-10-CM | POA: Diagnosis not present

## 2022-11-18 DIAGNOSIS — Z7409 Other reduced mobility: Secondary | ICD-10-CM | POA: Diagnosis not present

## 2022-11-18 DIAGNOSIS — I4821 Permanent atrial fibrillation: Secondary | ICD-10-CM | POA: Diagnosis not present

## 2022-11-19 DIAGNOSIS — M810 Age-related osteoporosis without current pathological fracture: Secondary | ICD-10-CM | POA: Diagnosis not present

## 2022-11-19 DIAGNOSIS — Z7409 Other reduced mobility: Secondary | ICD-10-CM | POA: Diagnosis not present

## 2022-11-19 DIAGNOSIS — H9190 Unspecified hearing loss, unspecified ear: Secondary | ICD-10-CM | POA: Diagnosis not present

## 2022-11-19 DIAGNOSIS — I5032 Chronic diastolic (congestive) heart failure: Secondary | ICD-10-CM | POA: Diagnosis not present

## 2022-11-19 DIAGNOSIS — I4821 Permanent atrial fibrillation: Secondary | ICD-10-CM | POA: Diagnosis not present

## 2022-11-19 DIAGNOSIS — I83028 Varicose veins of left lower extremity with ulcer other part of lower leg: Secondary | ICD-10-CM | POA: Diagnosis not present

## 2022-11-19 DIAGNOSIS — Z950 Presence of cardiac pacemaker: Secondary | ICD-10-CM | POA: Diagnosis not present

## 2022-11-19 DIAGNOSIS — I130 Hypertensive heart and chronic kidney disease with heart failure and stage 1 through stage 4 chronic kidney disease, or unspecified chronic kidney disease: Secondary | ICD-10-CM | POA: Diagnosis not present

## 2022-11-19 DIAGNOSIS — N184 Chronic kidney disease, stage 4 (severe): Secondary | ICD-10-CM | POA: Diagnosis not present

## 2022-11-19 DIAGNOSIS — E039 Hypothyroidism, unspecified: Secondary | ICD-10-CM | POA: Diagnosis not present

## 2022-11-19 DIAGNOSIS — I071 Rheumatic tricuspid insufficiency: Secondary | ICD-10-CM | POA: Diagnosis not present

## 2022-11-19 DIAGNOSIS — Z4800 Encounter for change or removal of nonsurgical wound dressing: Secondary | ICD-10-CM | POA: Diagnosis not present

## 2022-11-19 DIAGNOSIS — D631 Anemia in chronic kidney disease: Secondary | ICD-10-CM | POA: Diagnosis not present

## 2022-11-19 DIAGNOSIS — L97821 Non-pressure chronic ulcer of other part of left lower leg limited to breakdown of skin: Secondary | ICD-10-CM | POA: Diagnosis not present

## 2022-11-19 DIAGNOSIS — F411 Generalized anxiety disorder: Secondary | ICD-10-CM | POA: Diagnosis not present

## 2022-11-19 DIAGNOSIS — L089 Local infection of the skin and subcutaneous tissue, unspecified: Secondary | ICD-10-CM | POA: Diagnosis not present

## 2022-11-22 DIAGNOSIS — I130 Hypertensive heart and chronic kidney disease with heart failure and stage 1 through stage 4 chronic kidney disease, or unspecified chronic kidney disease: Secondary | ICD-10-CM | POA: Diagnosis not present

## 2022-11-22 DIAGNOSIS — I5032 Chronic diastolic (congestive) heart failure: Secondary | ICD-10-CM | POA: Diagnosis not present

## 2022-11-22 DIAGNOSIS — N184 Chronic kidney disease, stage 4 (severe): Secondary | ICD-10-CM | POA: Diagnosis not present

## 2022-11-22 DIAGNOSIS — Z4800 Encounter for change or removal of nonsurgical wound dressing: Secondary | ICD-10-CM | POA: Diagnosis not present

## 2022-11-22 DIAGNOSIS — I4821 Permanent atrial fibrillation: Secondary | ICD-10-CM | POA: Diagnosis not present

## 2022-11-22 DIAGNOSIS — F411 Generalized anxiety disorder: Secondary | ICD-10-CM | POA: Diagnosis not present

## 2022-11-22 DIAGNOSIS — Z7409 Other reduced mobility: Secondary | ICD-10-CM | POA: Diagnosis not present

## 2022-11-22 DIAGNOSIS — E039 Hypothyroidism, unspecified: Secondary | ICD-10-CM | POA: Diagnosis not present

## 2022-11-22 DIAGNOSIS — D631 Anemia in chronic kidney disease: Secondary | ICD-10-CM | POA: Diagnosis not present

## 2022-11-22 DIAGNOSIS — L97821 Non-pressure chronic ulcer of other part of left lower leg limited to breakdown of skin: Secondary | ICD-10-CM | POA: Diagnosis not present

## 2022-11-22 DIAGNOSIS — M810 Age-related osteoporosis without current pathological fracture: Secondary | ICD-10-CM | POA: Diagnosis not present

## 2022-11-22 DIAGNOSIS — L089 Local infection of the skin and subcutaneous tissue, unspecified: Secondary | ICD-10-CM | POA: Diagnosis not present

## 2022-11-22 DIAGNOSIS — I071 Rheumatic tricuspid insufficiency: Secondary | ICD-10-CM | POA: Diagnosis not present

## 2022-11-22 DIAGNOSIS — H9190 Unspecified hearing loss, unspecified ear: Secondary | ICD-10-CM | POA: Diagnosis not present

## 2022-11-22 DIAGNOSIS — Z950 Presence of cardiac pacemaker: Secondary | ICD-10-CM | POA: Diagnosis not present

## 2022-11-22 DIAGNOSIS — I83028 Varicose veins of left lower extremity with ulcer other part of lower leg: Secondary | ICD-10-CM | POA: Diagnosis not present

## 2022-11-23 DIAGNOSIS — I480 Paroxysmal atrial fibrillation: Secondary | ICD-10-CM | POA: Diagnosis not present

## 2022-11-23 DIAGNOSIS — I361 Nonrheumatic tricuspid (valve) insufficiency: Secondary | ICD-10-CM | POA: Diagnosis not present

## 2022-11-23 DIAGNOSIS — I110 Hypertensive heart disease with heart failure: Secondary | ICD-10-CM | POA: Diagnosis not present

## 2022-11-23 DIAGNOSIS — J90 Pleural effusion, not elsewhere classified: Secondary | ICD-10-CM | POA: Diagnosis not present

## 2022-11-23 DIAGNOSIS — I5032 Chronic diastolic (congestive) heart failure: Secondary | ICD-10-CM | POA: Diagnosis not present

## 2022-11-23 DIAGNOSIS — E039 Hypothyroidism, unspecified: Secondary | ICD-10-CM | POA: Diagnosis not present

## 2022-11-23 DIAGNOSIS — S71102A Unspecified open wound, left thigh, initial encounter: Secondary | ICD-10-CM | POA: Diagnosis not present

## 2022-11-23 DIAGNOSIS — I5033 Acute on chronic diastolic (congestive) heart failure: Secondary | ICD-10-CM | POA: Diagnosis not present

## 2022-11-23 DIAGNOSIS — I10 Essential (primary) hypertension: Secondary | ICD-10-CM | POA: Diagnosis not present

## 2022-11-23 DIAGNOSIS — I130 Hypertensive heart and chronic kidney disease with heart failure and stage 1 through stage 4 chronic kidney disease, or unspecified chronic kidney disease: Secondary | ICD-10-CM | POA: Diagnosis not present

## 2022-11-23 DIAGNOSIS — S81802D Unspecified open wound, left lower leg, subsequent encounter: Secondary | ICD-10-CM | POA: Diagnosis not present

## 2022-11-23 DIAGNOSIS — S0083XA Contusion of other part of head, initial encounter: Secondary | ICD-10-CM | POA: Diagnosis not present

## 2022-11-23 DIAGNOSIS — R5381 Other malaise: Secondary | ICD-10-CM | POA: Diagnosis not present

## 2022-11-23 DIAGNOSIS — Z853 Personal history of malignant neoplasm of breast: Secondary | ICD-10-CM | POA: Diagnosis not present

## 2022-11-23 DIAGNOSIS — J939 Pneumothorax, unspecified: Secondary | ICD-10-CM | POA: Diagnosis not present

## 2022-11-23 DIAGNOSIS — S0990XA Unspecified injury of head, initial encounter: Secondary | ICD-10-CM | POA: Diagnosis not present

## 2022-11-23 DIAGNOSIS — Z950 Presence of cardiac pacemaker: Secondary | ICD-10-CM | POA: Diagnosis not present

## 2022-11-23 DIAGNOSIS — S199XXA Unspecified injury of neck, initial encounter: Secondary | ICD-10-CM | POA: Diagnosis not present

## 2022-11-23 DIAGNOSIS — F411 Generalized anxiety disorder: Secondary | ICD-10-CM | POA: Diagnosis not present

## 2022-11-23 DIAGNOSIS — N184 Chronic kidney disease, stage 4 (severe): Secondary | ICD-10-CM | POA: Diagnosis not present

## 2022-11-23 DIAGNOSIS — S161XXA Strain of muscle, fascia and tendon at neck level, initial encounter: Secondary | ICD-10-CM | POA: Diagnosis not present

## 2022-11-23 DIAGNOSIS — J948 Other specified pleural conditions: Secondary | ICD-10-CM | POA: Diagnosis not present

## 2022-11-23 DIAGNOSIS — S4992XA Unspecified injury of left shoulder and upper arm, initial encounter: Secondary | ICD-10-CM | POA: Diagnosis not present

## 2022-11-23 DIAGNOSIS — E876 Hypokalemia: Secondary | ICD-10-CM | POA: Diagnosis not present

## 2022-11-23 DIAGNOSIS — K219 Gastro-esophageal reflux disease without esophagitis: Secondary | ICD-10-CM | POA: Diagnosis not present

## 2022-11-23 DIAGNOSIS — J45909 Unspecified asthma, uncomplicated: Secondary | ICD-10-CM | POA: Diagnosis not present

## 2022-11-23 DIAGNOSIS — I4821 Permanent atrial fibrillation: Secondary | ICD-10-CM | POA: Diagnosis not present

## 2022-11-23 DIAGNOSIS — J449 Chronic obstructive pulmonary disease, unspecified: Secondary | ICD-10-CM | POA: Diagnosis not present

## 2022-11-23 DIAGNOSIS — S40022A Contusion of left upper arm, initial encounter: Secondary | ICD-10-CM | POA: Diagnosis not present

## 2022-11-23 DIAGNOSIS — K5900 Constipation, unspecified: Secondary | ICD-10-CM | POA: Diagnosis not present

## 2022-11-23 DIAGNOSIS — R918 Other nonspecific abnormal finding of lung field: Secondary | ICD-10-CM | POA: Diagnosis not present

## 2022-11-23 DIAGNOSIS — Z66 Do not resuscitate: Secondary | ICD-10-CM | POA: Diagnosis not present

## 2022-11-23 DIAGNOSIS — I071 Rheumatic tricuspid insufficiency: Secondary | ICD-10-CM | POA: Diagnosis not present

## 2022-11-23 DIAGNOSIS — R410 Disorientation, unspecified: Secondary | ICD-10-CM | POA: Diagnosis not present

## 2022-11-23 DIAGNOSIS — W19XXXA Unspecified fall, initial encounter: Secondary | ICD-10-CM | POA: Diagnosis not present

## 2022-11-23 DIAGNOSIS — Z9181 History of falling: Secondary | ICD-10-CM | POA: Diagnosis not present

## 2022-11-23 DIAGNOSIS — I509 Heart failure, unspecified: Secondary | ICD-10-CM | POA: Diagnosis not present

## 2022-11-23 DIAGNOSIS — F419 Anxiety disorder, unspecified: Secondary | ICD-10-CM | POA: Diagnosis not present

## 2022-11-23 DIAGNOSIS — J95811 Postprocedural pneumothorax: Secondary | ICD-10-CM | POA: Diagnosis not present

## 2022-12-01 DIAGNOSIS — L97821 Non-pressure chronic ulcer of other part of left lower leg limited to breakdown of skin: Secondary | ICD-10-CM | POA: Diagnosis not present

## 2022-12-01 DIAGNOSIS — Z7409 Other reduced mobility: Secondary | ICD-10-CM | POA: Diagnosis not present

## 2022-12-01 DIAGNOSIS — I4821 Permanent atrial fibrillation: Secondary | ICD-10-CM | POA: Diagnosis not present

## 2022-12-01 DIAGNOSIS — E039 Hypothyroidism, unspecified: Secondary | ICD-10-CM | POA: Diagnosis not present

## 2022-12-01 DIAGNOSIS — I5032 Chronic diastolic (congestive) heart failure: Secondary | ICD-10-CM | POA: Diagnosis not present

## 2022-12-01 DIAGNOSIS — R918 Other nonspecific abnormal finding of lung field: Secondary | ICD-10-CM | POA: Diagnosis not present

## 2022-12-01 DIAGNOSIS — R059 Cough, unspecified: Secondary | ICD-10-CM | POA: Diagnosis not present

## 2022-12-01 DIAGNOSIS — K219 Gastro-esophageal reflux disease without esophagitis: Secondary | ICD-10-CM | POA: Diagnosis not present

## 2022-12-01 DIAGNOSIS — Z853 Personal history of malignant neoplasm of breast: Secondary | ICD-10-CM | POA: Diagnosis not present

## 2022-12-01 DIAGNOSIS — S81802D Unspecified open wound, left lower leg, subsequent encounter: Secondary | ICD-10-CM | POA: Diagnosis not present

## 2022-12-01 DIAGNOSIS — I83028 Varicose veins of left lower extremity with ulcer other part of lower leg: Secondary | ICD-10-CM | POA: Diagnosis not present

## 2022-12-01 DIAGNOSIS — H9190 Unspecified hearing loss, unspecified ear: Secondary | ICD-10-CM | POA: Diagnosis not present

## 2022-12-01 DIAGNOSIS — F419 Anxiety disorder, unspecified: Secondary | ICD-10-CM | POA: Diagnosis not present

## 2022-12-01 DIAGNOSIS — I361 Nonrheumatic tricuspid (valve) insufficiency: Secondary | ICD-10-CM | POA: Diagnosis not present

## 2022-12-01 DIAGNOSIS — I071 Rheumatic tricuspid insufficiency: Secondary | ICD-10-CM | POA: Diagnosis not present

## 2022-12-01 DIAGNOSIS — Z4800 Encounter for change or removal of nonsurgical wound dressing: Secondary | ICD-10-CM | POA: Diagnosis not present

## 2022-12-01 DIAGNOSIS — D631 Anemia in chronic kidney disease: Secondary | ICD-10-CM | POA: Diagnosis not present

## 2022-12-01 DIAGNOSIS — M810 Age-related osteoporosis without current pathological fracture: Secondary | ICD-10-CM | POA: Diagnosis not present

## 2022-12-01 DIAGNOSIS — L089 Local infection of the skin and subcutaneous tissue, unspecified: Secondary | ICD-10-CM | POA: Diagnosis not present

## 2022-12-01 DIAGNOSIS — I442 Atrioventricular block, complete: Secondary | ICD-10-CM | POA: Diagnosis not present

## 2022-12-01 DIAGNOSIS — E876 Hypokalemia: Secondary | ICD-10-CM | POA: Diagnosis not present

## 2022-12-01 DIAGNOSIS — J948 Other specified pleural conditions: Secondary | ICD-10-CM | POA: Diagnosis not present

## 2022-12-01 DIAGNOSIS — Z9181 History of falling: Secondary | ICD-10-CM | POA: Diagnosis not present

## 2022-12-01 DIAGNOSIS — R5383 Other fatigue: Secondary | ICD-10-CM | POA: Diagnosis not present

## 2022-12-01 DIAGNOSIS — I130 Hypertensive heart and chronic kidney disease with heart failure and stage 1 through stage 4 chronic kidney disease, or unspecified chronic kidney disease: Secondary | ICD-10-CM | POA: Diagnosis not present

## 2022-12-01 DIAGNOSIS — Z45018 Encounter for adjustment and management of other part of cardiac pacemaker: Secondary | ICD-10-CM | POA: Diagnosis not present

## 2022-12-01 DIAGNOSIS — Z950 Presence of cardiac pacemaker: Secondary | ICD-10-CM | POA: Diagnosis not present

## 2022-12-01 DIAGNOSIS — K5900 Constipation, unspecified: Secondary | ICD-10-CM | POA: Diagnosis not present

## 2022-12-01 DIAGNOSIS — F411 Generalized anxiety disorder: Secondary | ICD-10-CM | POA: Diagnosis not present

## 2022-12-01 DIAGNOSIS — I480 Paroxysmal atrial fibrillation: Secondary | ICD-10-CM | POA: Diagnosis not present

## 2022-12-01 DIAGNOSIS — N184 Chronic kidney disease, stage 4 (severe): Secondary | ICD-10-CM | POA: Diagnosis not present

## 2022-12-01 DIAGNOSIS — N1832 Chronic kidney disease, stage 3b: Secondary | ICD-10-CM | POA: Diagnosis not present

## 2022-12-01 DIAGNOSIS — J90 Pleural effusion, not elsewhere classified: Secondary | ICD-10-CM | POA: Diagnosis not present

## 2022-12-01 DIAGNOSIS — J449 Chronic obstructive pulmonary disease, unspecified: Secondary | ICD-10-CM | POA: Diagnosis not present

## 2022-12-07 DIAGNOSIS — I5032 Chronic diastolic (congestive) heart failure: Secondary | ICD-10-CM | POA: Diagnosis not present

## 2022-12-07 DIAGNOSIS — J90 Pleural effusion, not elsewhere classified: Secondary | ICD-10-CM | POA: Diagnosis not present

## 2022-12-07 DIAGNOSIS — Z950 Presence of cardiac pacemaker: Secondary | ICD-10-CM | POA: Diagnosis not present

## 2022-12-07 DIAGNOSIS — Z45018 Encounter for adjustment and management of other part of cardiac pacemaker: Secondary | ICD-10-CM | POA: Diagnosis not present

## 2022-12-07 DIAGNOSIS — N1832 Chronic kidney disease, stage 3b: Secondary | ICD-10-CM | POA: Diagnosis not present

## 2022-12-07 DIAGNOSIS — R918 Other nonspecific abnormal finding of lung field: Secondary | ICD-10-CM | POA: Diagnosis not present

## 2022-12-07 DIAGNOSIS — I442 Atrioventricular block, complete: Secondary | ICD-10-CM | POA: Diagnosis not present

## 2022-12-07 DIAGNOSIS — I4821 Permanent atrial fibrillation: Secondary | ICD-10-CM | POA: Diagnosis not present

## 2022-12-07 DIAGNOSIS — J948 Other specified pleural conditions: Secondary | ICD-10-CM | POA: Diagnosis not present

## 2022-12-10 DIAGNOSIS — R059 Cough, unspecified: Secondary | ICD-10-CM | POA: Diagnosis not present

## 2022-12-21 DIAGNOSIS — K5901 Slow transit constipation: Secondary | ICD-10-CM | POA: Diagnosis not present

## 2022-12-21 DIAGNOSIS — R410 Disorientation, unspecified: Secondary | ICD-10-CM | POA: Diagnosis not present

## 2022-12-21 DIAGNOSIS — E039 Hypothyroidism, unspecified: Secondary | ICD-10-CM | POA: Diagnosis not present

## 2022-12-21 DIAGNOSIS — I480 Paroxysmal atrial fibrillation: Secondary | ICD-10-CM | POA: Diagnosis not present

## 2022-12-28 DIAGNOSIS — S81802D Unspecified open wound, left lower leg, subsequent encounter: Secondary | ICD-10-CM | POA: Diagnosis not present

## 2022-12-28 DIAGNOSIS — I5033 Acute on chronic diastolic (congestive) heart failure: Secondary | ICD-10-CM | POA: Diagnosis not present

## 2023-01-04 ENCOUNTER — Telehealth: Payer: Self-pay | Admitting: Internal Medicine

## 2023-01-04 DIAGNOSIS — I5033 Acute on chronic diastolic (congestive) heart failure: Secondary | ICD-10-CM | POA: Diagnosis not present

## 2023-01-04 DIAGNOSIS — I5032 Chronic diastolic (congestive) heart failure: Secondary | ICD-10-CM | POA: Diagnosis not present

## 2023-01-04 DIAGNOSIS — R141 Gas pain: Secondary | ICD-10-CM | POA: Diagnosis not present

## 2023-01-04 DIAGNOSIS — I480 Paroxysmal atrial fibrillation: Secondary | ICD-10-CM | POA: Diagnosis not present

## 2023-01-04 DIAGNOSIS — E876 Hypokalemia: Secondary | ICD-10-CM | POA: Diagnosis not present

## 2023-01-04 DIAGNOSIS — E039 Hypothyroidism, unspecified: Secondary | ICD-10-CM | POA: Diagnosis not present

## 2023-01-04 NOTE — Telephone Encounter (Signed)
Contacted Alyssa [name] to schedule their annual wellness visit. Patient declined to schedule AWV at this time. Patient lives in [place]  [facility] Care Guide [facility] AWV TEAM Direct Dial: [phone]

## 2023-01-11 DIAGNOSIS — N1832 Chronic kidney disease, stage 3b: Secondary | ICD-10-CM | POA: Diagnosis not present

## 2023-01-11 DIAGNOSIS — I5033 Acute on chronic diastolic (congestive) heart failure: Secondary | ICD-10-CM | POA: Diagnosis not present

## 2023-01-11 DIAGNOSIS — S81802D Unspecified open wound, left lower leg, subsequent encounter: Secondary | ICD-10-CM | POA: Diagnosis not present

## 2023-01-11 DIAGNOSIS — E039 Hypothyroidism, unspecified: Secondary | ICD-10-CM | POA: Diagnosis not present

## 2023-01-13 DIAGNOSIS — F4323 Adjustment disorder with mixed anxiety and depressed mood: Secondary | ICD-10-CM | POA: Diagnosis not present

## 2023-01-18 DIAGNOSIS — R141 Gas pain: Secondary | ICD-10-CM | POA: Diagnosis not present

## 2023-01-18 DIAGNOSIS — I5033 Acute on chronic diastolic (congestive) heart failure: Secondary | ICD-10-CM | POA: Diagnosis not present

## 2023-01-18 DIAGNOSIS — F32A Depression, unspecified: Secondary | ICD-10-CM | POA: Diagnosis not present

## 2023-01-25 DIAGNOSIS — R410 Disorientation, unspecified: Secondary | ICD-10-CM | POA: Diagnosis not present

## 2023-01-25 DIAGNOSIS — F418 Other specified anxiety disorders: Secondary | ICD-10-CM | POA: Diagnosis not present

## 2023-01-26 DIAGNOSIS — F418 Other specified anxiety disorders: Secondary | ICD-10-CM | POA: Diagnosis not present

## 2023-01-26 DIAGNOSIS — E039 Hypothyroidism, unspecified: Secondary | ICD-10-CM | POA: Diagnosis not present

## 2023-01-26 DIAGNOSIS — R141 Gas pain: Secondary | ICD-10-CM | POA: Diagnosis not present

## 2023-01-26 DIAGNOSIS — I5030 Unspecified diastolic (congestive) heart failure: Secondary | ICD-10-CM | POA: Diagnosis not present

## 2023-02-07 DIAGNOSIS — I130 Hypertensive heart and chronic kidney disease with heart failure and stage 1 through stage 4 chronic kidney disease, or unspecified chronic kidney disease: Secondary | ICD-10-CM | POA: Diagnosis not present

## 2023-02-07 DIAGNOSIS — E876 Hypokalemia: Secondary | ICD-10-CM | POA: Diagnosis not present

## 2023-02-08 DIAGNOSIS — R410 Disorientation, unspecified: Secondary | ICD-10-CM | POA: Diagnosis not present

## 2023-02-08 DIAGNOSIS — F418 Other specified anxiety disorders: Secondary | ICD-10-CM | POA: Diagnosis not present

## 2023-02-11 DIAGNOSIS — R41841 Cognitive communication deficit: Secondary | ICD-10-CM | POA: Diagnosis not present

## 2023-02-11 DIAGNOSIS — F4323 Adjustment disorder with mixed anxiety and depressed mood: Secondary | ICD-10-CM | POA: Diagnosis not present

## 2023-02-15 ENCOUNTER — Other Ambulatory Visit: Payer: Self-pay | Admitting: Internal Medicine

## 2023-02-15 DIAGNOSIS — R630 Anorexia: Secondary | ICD-10-CM | POA: Diagnosis not present

## 2023-02-15 DIAGNOSIS — F418 Other specified anxiety disorders: Secondary | ICD-10-CM | POA: Diagnosis not present

## 2023-02-15 DIAGNOSIS — E039 Hypothyroidism, unspecified: Secondary | ICD-10-CM

## 2023-02-15 DIAGNOSIS — R410 Disorientation, unspecified: Secondary | ICD-10-CM | POA: Diagnosis not present

## 2023-02-15 DIAGNOSIS — F5104 Psychophysiologic insomnia: Secondary | ICD-10-CM | POA: Diagnosis not present

## 2023-02-22 DIAGNOSIS — S51011D Laceration without foreign body of right elbow, subsequent encounter: Secondary | ICD-10-CM | POA: Diagnosis not present

## 2023-02-22 DIAGNOSIS — F418 Other specified anxiety disorders: Secondary | ICD-10-CM | POA: Diagnosis not present

## 2023-02-22 DIAGNOSIS — R634 Abnormal weight loss: Secondary | ICD-10-CM | POA: Diagnosis not present

## 2023-02-22 DIAGNOSIS — I5030 Unspecified diastolic (congestive) heart failure: Secondary | ICD-10-CM | POA: Diagnosis not present

## 2023-02-26 DIAGNOSIS — K219 Gastro-esophageal reflux disease without esophagitis: Secondary | ICD-10-CM | POA: Diagnosis not present

## 2023-02-26 DIAGNOSIS — E079 Disorder of thyroid, unspecified: Secondary | ICD-10-CM | POA: Diagnosis not present

## 2023-02-26 DIAGNOSIS — R410 Disorientation, unspecified: Secondary | ICD-10-CM | POA: Diagnosis not present

## 2023-02-26 DIAGNOSIS — I509 Heart failure, unspecified: Secondary | ICD-10-CM | POA: Diagnosis not present

## 2023-02-26 DIAGNOSIS — R918 Other nonspecific abnormal finding of lung field: Secondary | ICD-10-CM | POA: Diagnosis not present

## 2023-02-26 DIAGNOSIS — R58 Hemorrhage, not elsewhere classified: Secondary | ICD-10-CM | POA: Diagnosis not present

## 2023-02-26 DIAGNOSIS — S299XXA Unspecified injury of thorax, initial encounter: Secondary | ICD-10-CM | POA: Diagnosis not present

## 2023-02-26 DIAGNOSIS — S0003XA Contusion of scalp, initial encounter: Secondary | ICD-10-CM | POA: Diagnosis not present

## 2023-02-26 DIAGNOSIS — Z743 Need for continuous supervision: Secondary | ICD-10-CM | POA: Diagnosis not present

## 2023-02-26 DIAGNOSIS — Z79899 Other long term (current) drug therapy: Secondary | ICD-10-CM | POA: Diagnosis not present

## 2023-02-26 DIAGNOSIS — Z79890 Hormone replacement therapy (postmenopausal): Secondary | ICD-10-CM | POA: Diagnosis not present

## 2023-02-26 DIAGNOSIS — I10 Essential (primary) hypertension: Secondary | ICD-10-CM | POA: Diagnosis not present

## 2023-02-26 DIAGNOSIS — W19XXXA Unspecified fall, initial encounter: Secondary | ICD-10-CM | POA: Diagnosis not present

## 2023-02-26 DIAGNOSIS — S0990XA Unspecified injury of head, initial encounter: Secondary | ICD-10-CM | POA: Diagnosis not present

## 2023-02-26 DIAGNOSIS — R519 Headache, unspecified: Secondary | ICD-10-CM | POA: Diagnosis not present

## 2023-02-26 DIAGNOSIS — Z87891 Personal history of nicotine dependence: Secondary | ICD-10-CM | POA: Diagnosis not present

## 2023-02-26 DIAGNOSIS — I4891 Unspecified atrial fibrillation: Secondary | ICD-10-CM | POA: Diagnosis not present

## 2023-02-26 DIAGNOSIS — S199XXA Unspecified injury of neck, initial encounter: Secondary | ICD-10-CM | POA: Diagnosis not present

## 2023-02-26 DIAGNOSIS — J90 Pleural effusion, not elsewhere classified: Secondary | ICD-10-CM | POA: Diagnosis not present

## 2023-03-01 DIAGNOSIS — J90 Pleural effusion, not elsewhere classified: Secondary | ICD-10-CM | POA: Diagnosis not present

## 2023-03-01 DIAGNOSIS — F418 Other specified anxiety disorders: Secondary | ICD-10-CM | POA: Diagnosis not present

## 2023-03-01 DIAGNOSIS — I5030 Unspecified diastolic (congestive) heart failure: Secondary | ICD-10-CM | POA: Diagnosis not present

## 2023-05-10 DEATH — deceased
